# Patient Record
Sex: Female | Born: 1955 | Race: White | Hispanic: No | Smoking: Never smoker
Health system: Southern US, Community
[De-identification: ages and names within clinical notes are randomized; demographics above are authoritative.]

## PROBLEM LIST (undated history)

## (undated) DIAGNOSIS — F419 Anxiety disorder, unspecified: Secondary | ICD-10-CM

## (undated) DIAGNOSIS — E669 Obesity, unspecified: Secondary | ICD-10-CM

## (undated) DIAGNOSIS — A419 Sepsis, unspecified organism: Secondary | ICD-10-CM

## (undated) DIAGNOSIS — E1140 Type 2 diabetes mellitus with diabetic neuropathy, unspecified: Secondary | ICD-10-CM

## (undated) DIAGNOSIS — N189 Chronic kidney disease, unspecified: Secondary | ICD-10-CM

## (undated) DIAGNOSIS — J189 Pneumonia, unspecified organism: Secondary | ICD-10-CM

## (undated) DIAGNOSIS — J40 Bronchitis, not specified as acute or chronic: Secondary | ICD-10-CM

## (undated) DIAGNOSIS — I639 Cerebral infarction, unspecified: Secondary | ICD-10-CM

## (undated) DIAGNOSIS — G43909 Migraine, unspecified, not intractable, without status migrainosus: Secondary | ICD-10-CM

## (undated) DIAGNOSIS — I10 Essential (primary) hypertension: Secondary | ICD-10-CM

## (undated) DIAGNOSIS — M1990 Unspecified osteoarthritis, unspecified site: Secondary | ICD-10-CM

## (undated) DIAGNOSIS — F32A Depression, unspecified: Secondary | ICD-10-CM

## (undated) DIAGNOSIS — D649 Anemia, unspecified: Secondary | ICD-10-CM

## (undated) DIAGNOSIS — M549 Dorsalgia, unspecified: Secondary | ICD-10-CM

## (undated) DIAGNOSIS — M719 Bursopathy, unspecified: Secondary | ICD-10-CM

## (undated) DIAGNOSIS — E785 Hyperlipidemia, unspecified: Secondary | ICD-10-CM

## (undated) DIAGNOSIS — J45909 Unspecified asthma, uncomplicated: Secondary | ICD-10-CM

## (undated) DIAGNOSIS — N390 Urinary tract infection, site not specified: Secondary | ICD-10-CM

## (undated) DIAGNOSIS — J449 Chronic obstructive pulmonary disease, unspecified: Secondary | ICD-10-CM

## (undated) DIAGNOSIS — K219 Gastro-esophageal reflux disease without esophagitis: Secondary | ICD-10-CM

## (undated) HISTORY — PX: CHOLECYSTECTOMY: SHX55

## (undated) HISTORY — PX: EYE SURGERY: SHX253

---

## 1997-07-21 ENCOUNTER — Other Ambulatory Visit: Admission: RE | Admit: 1997-07-21 | Discharge: 1997-07-21 | Payer: Self-pay

## 1997-10-06 ENCOUNTER — Other Ambulatory Visit: Admission: RE | Admit: 1997-10-06 | Discharge: 1997-10-06 | Payer: Self-pay

## 2003-10-02 ENCOUNTER — Emergency Department (HOSPITAL_COMMUNITY): Admission: EM | Admit: 2003-10-02 | Discharge: 2003-10-02 | Payer: Self-pay | Admitting: Emergency Medicine

## 2005-05-28 ENCOUNTER — Emergency Department (HOSPITAL_COMMUNITY): Admission: EM | Admit: 2005-05-28 | Discharge: 2005-05-28 | Payer: Self-pay | Admitting: Emergency Medicine

## 2010-09-03 ENCOUNTER — Emergency Department (INDEPENDENT_AMBULATORY_CARE_PROVIDER_SITE_OTHER): Payer: Medicaid Other

## 2010-09-03 ENCOUNTER — Emergency Department (HOSPITAL_BASED_OUTPATIENT_CLINIC_OR_DEPARTMENT_OTHER)
Admission: EM | Admit: 2010-09-03 | Discharge: 2010-09-03 | Disposition: A | Discharge status: Home / Self Care | Payer: Medicaid Other | Attending: Emergency Medicine | Admitting: Emergency Medicine

## 2010-09-03 DIAGNOSIS — R109 Unspecified abdominal pain: Secondary | ICD-10-CM

## 2010-09-03 DIAGNOSIS — Z794 Long term (current) use of insulin: Secondary | ICD-10-CM | POA: Insufficient documentation

## 2010-09-03 DIAGNOSIS — E1149 Type 2 diabetes mellitus with other diabetic neurological complication: Secondary | ICD-10-CM | POA: Insufficient documentation

## 2010-09-03 DIAGNOSIS — Z79899 Other long term (current) drug therapy: Secondary | ICD-10-CM | POA: Insufficient documentation

## 2010-09-03 DIAGNOSIS — E1142 Type 2 diabetes mellitus with diabetic polyneuropathy: Secondary | ICD-10-CM | POA: Insufficient documentation

## 2010-09-03 DIAGNOSIS — G43909 Migraine, unspecified, not intractable, without status migrainosus: Secondary | ICD-10-CM | POA: Insufficient documentation

## 2010-09-03 DIAGNOSIS — R634 Abnormal weight loss: Secondary | ICD-10-CM

## 2010-09-03 LAB — DIFFERENTIAL
Basophils Absolute: 0 10*3/uL (ref 0.0–0.1)
Basophils Relative: 0 % (ref 0–1)
Eosinophils Absolute: 0.1 10*3/uL (ref 0.0–0.7)
Neutro Abs: 4.2 10*3/uL (ref 1.7–7.7)
Neutrophils Relative %: 54 % (ref 43–77)

## 2010-09-03 LAB — COMPREHENSIVE METABOLIC PANEL
ALT: 11 U/L (ref 0–35)
Albumin: 4.1 g/dL (ref 3.5–5.2)
Calcium: 9.7 mg/dL (ref 8.4–10.5)
GFR calc Af Amer: >60 mL/min (ref >60)
Glucose, Bld: 203 mg/dL — ABNORMAL HIGH (ref 70–99)
Sodium: 139 mEq/L (ref 135–145)
Total Protein: 7.5 g/dL (ref 6.0–8.3)

## 2010-09-03 LAB — CBC
Hemoglobin: 13.4 g/dL (ref 12.0–15.0)
Platelets: 280 10*3/uL (ref 150–400)
RBC: 4.83 MIL/uL (ref 3.87–5.11)
WBC: 7.7 10*3/uL (ref 4.0–10.5)

## 2010-09-03 LAB — URINALYSIS, ROUTINE W REFLEX MICROSCOPIC
Bilirubin Urine: NEGATIVE
Glucose, UA: >1000 mg/dL — AB
Hgb urine dipstick: NEGATIVE
Specific Gravity, Urine: 1.034 — ABNORMAL HIGH (ref 1.005–1.030)

## 2010-09-03 LAB — GLUCOSE, CAPILLARY: Glucose-Capillary: 180 mg/dL — ABNORMAL HIGH (ref 70–99)

## 2010-09-03 LAB — URINE MICROSCOPIC-ADD ON

## 2010-09-03 IMAGING — CT CT ABD-PELV W/ CM
2 of 5 series · 17 of 46 positions shown, 19 images · IV contrast (APPLIED)
Comparison: None.

CLINICAL DATA: Abdominal pain and weight loss.

CT ABDOMEN AND PELVIS WITH CONTRAST
TECHNIQUE: Multidetector CT imaging of the abdomen and pelvis was
performed following the standard protocol during bolus
administration of intravenous contrast.
Contrast: 100 ml [XT].

[Series 2: abd/pelvis 5.0 b31f · axial · 0.79mm/px · z∈[-492,-87]mm · 14 of 91 slices shown, 16 images]
[im 5/91  soft-tissue]
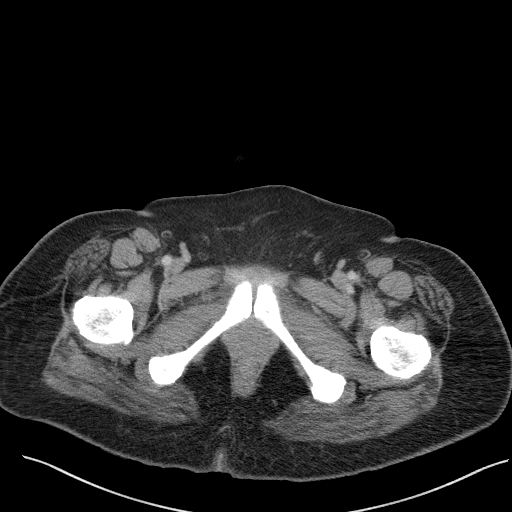
[im 5/91  bone]
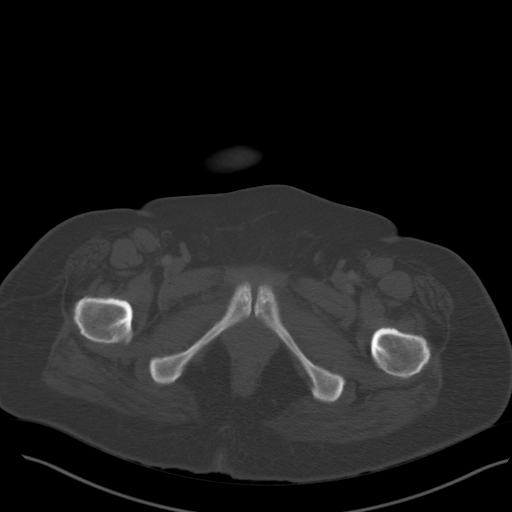
[im 10/91  soft-tissue]
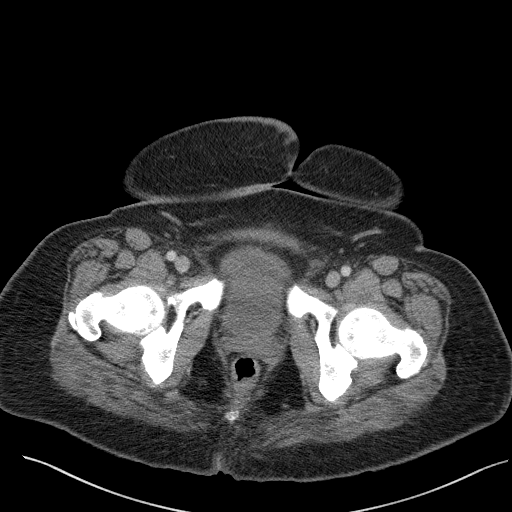
[im 19/91  soft-tissue]
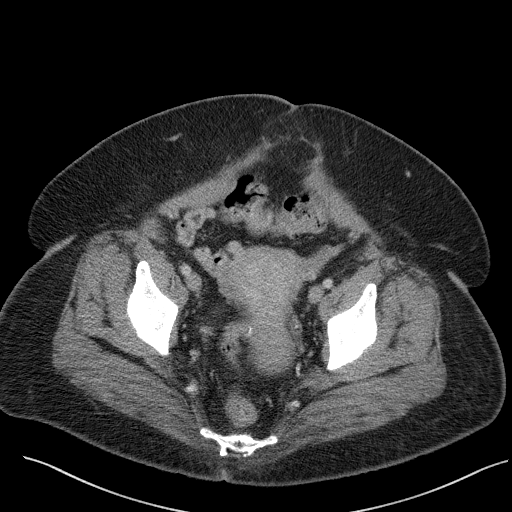
[im 24/91  soft-tissue]
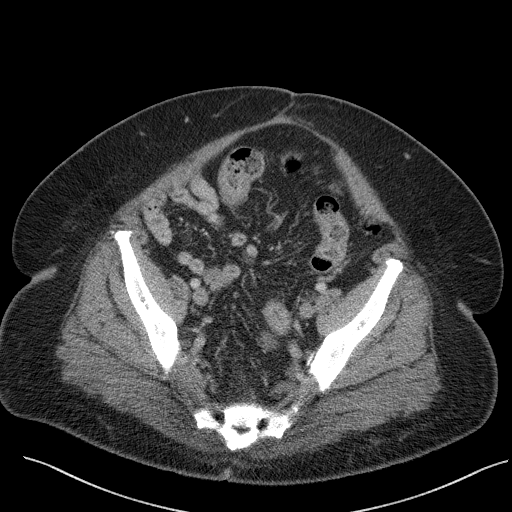
[im 29/91  soft-tissue]
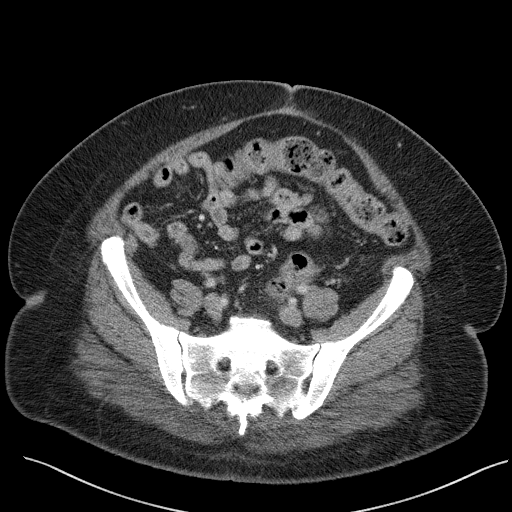
[im 38/91  soft-tissue]
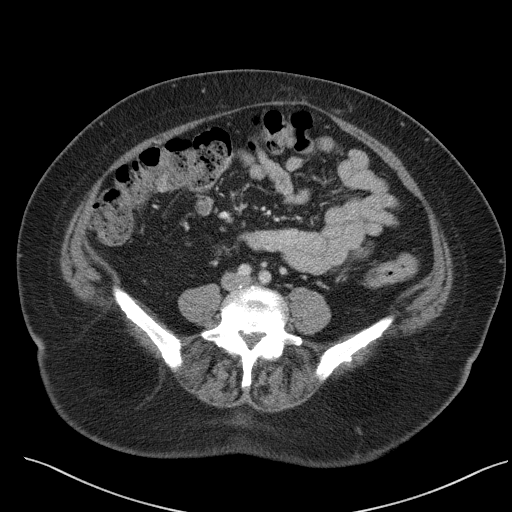
[im 43/91  soft-tissue]
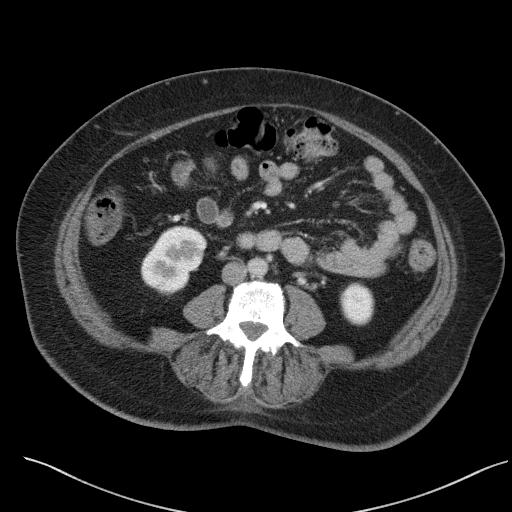
[im 48/91  soft-tissue]
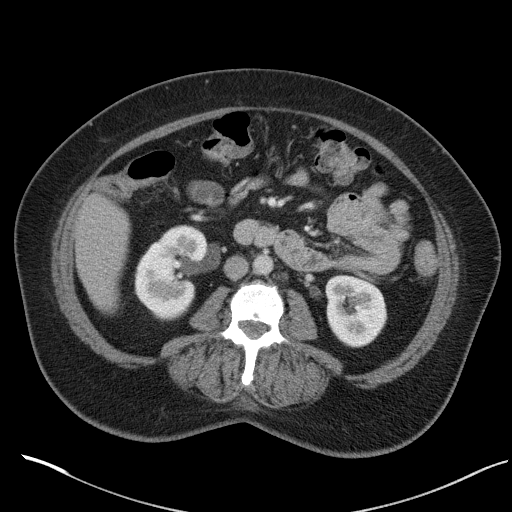
[im 53/91  soft-tissue]
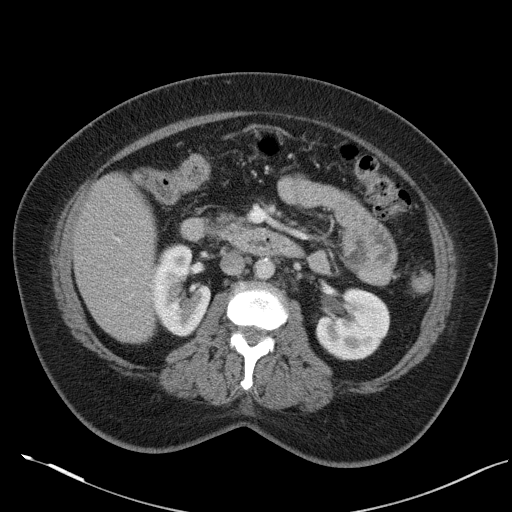
[im 53/91  bone]
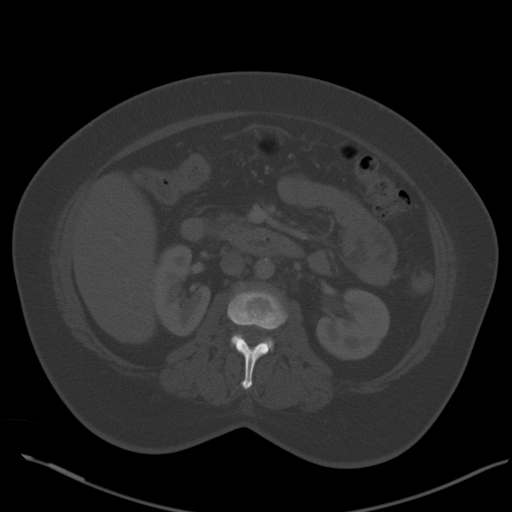
[im 62/91  soft-tissue]
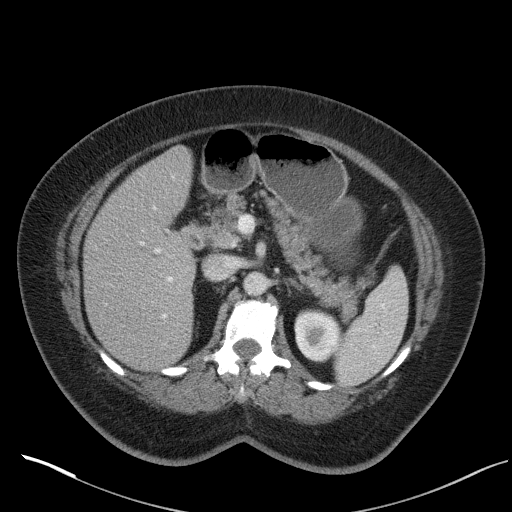
[im 67/91  soft-tissue]
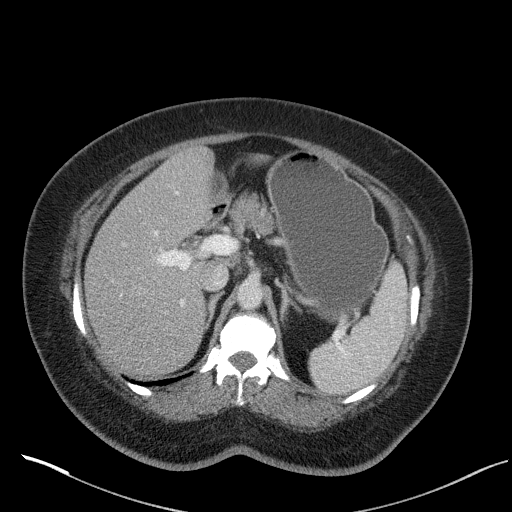
[im 72/91  soft-tissue]
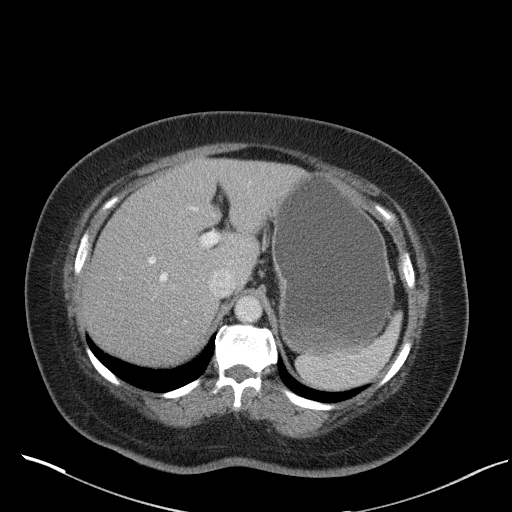
[im 81/91  soft-tissue]
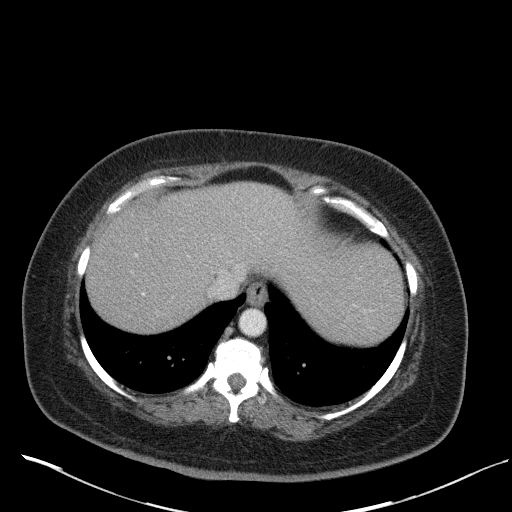
[im 86/91  soft-tissue]
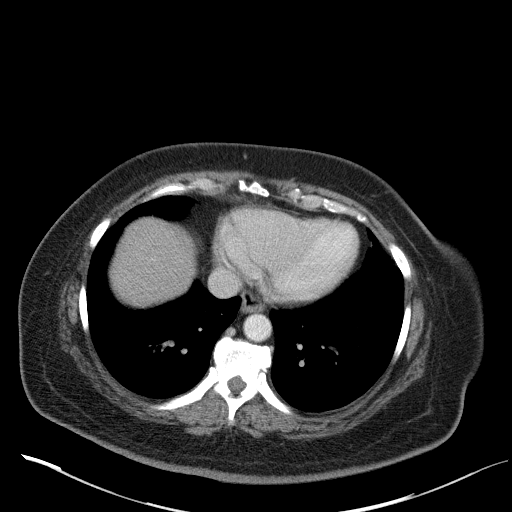

[Series 5: abd/pelvis 3.0 coronal · coronal · 0.91mm/px · 3 of 95 slices shown]
[im 32/95  soft-tissue]
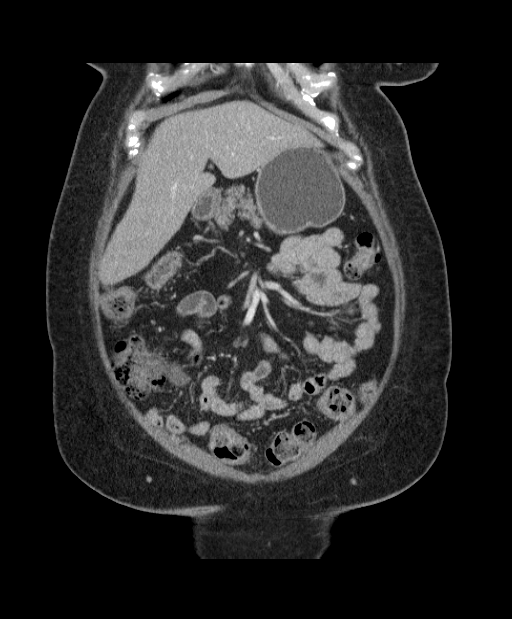
[im 42/95  soft-tissue]
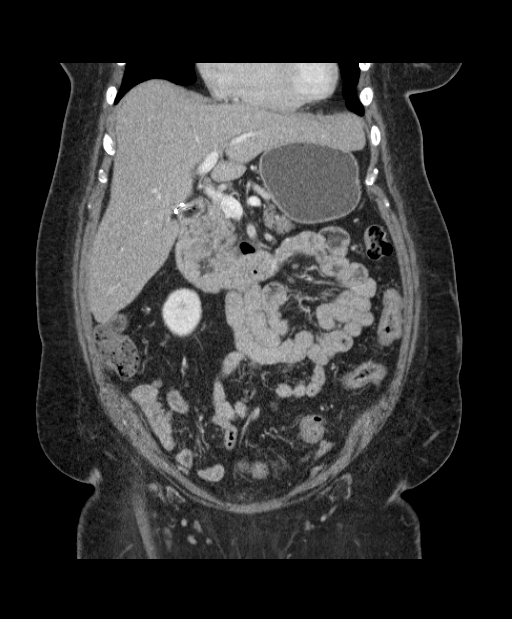
[im 53/95  soft-tissue]
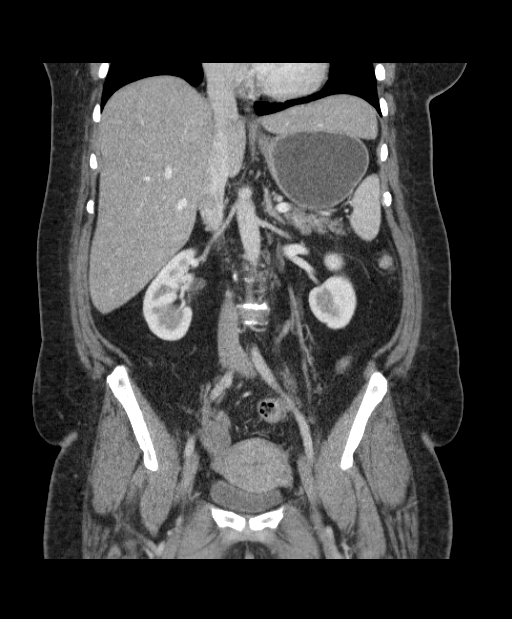

[17 of 46 positions shown; findings below may reference images not displayed]

FINDINGS: Lung bases are clear.  No pleural or pericardial
effusion.

The patient is status post cholecystectomy.  The liver, spleen,
adrenal glands, pancreas and kidneys all appear normal.  The
patient has a small fat containing midline hernia in the low
pelvis. A second very small midline hernia containing fat is
identified at the level of the upper sacrum.  Uterus, adnexa and
urinary bladder are unremarkable. No lymphadenopathy or fluid is
seen.  Stomach, small and large bowel and appendix are
unremarkable.  No focal bony abnormality.  Injection granuloma in
the left buttock is seen.
IMPRESSION: 1.  No acute finding.
2.  Two small fat containing midline ventral hernias are noted.

## 2010-09-03 MED ORDER — IOHEXOL 300 MG/ML  SOLN
100.0000 mL | Freq: Once | INTRAMUSCULAR | Status: AC | PRN
  Administered 2010-09-03: 100 mL via INTRAVENOUS

## 2011-05-06 ENCOUNTER — Encounter (HOSPITAL_BASED_OUTPATIENT_CLINIC_OR_DEPARTMENT_OTHER): Payer: Self-pay

## 2011-05-06 ENCOUNTER — Emergency Department (HOSPITAL_BASED_OUTPATIENT_CLINIC_OR_DEPARTMENT_OTHER)
Admission: EM | Admit: 2011-05-06 | Discharge: 2011-05-06 | Discharge status: Against Medical Advice | Payer: Medicaid Other | Attending: Emergency Medicine | Admitting: Emergency Medicine

## 2011-05-06 DIAGNOSIS — J069 Acute upper respiratory infection, unspecified: Secondary | ICD-10-CM | POA: Insufficient documentation

## 2011-05-06 HISTORY — DX: Bronchitis, not specified as acute or chronic: J40

## 2011-05-06 HISTORY — DX: Type 2 diabetes mellitus with diabetic neuropathy, unspecified: E11.40

## 2011-05-06 HISTORY — DX: Hyperlipidemia, unspecified: E78.5

## 2011-05-06 HISTORY — DX: Unspecified osteoarthritis, unspecified site: M19.90

## 2011-05-06 HISTORY — DX: Essential (primary) hypertension: I10

## 2011-05-06 HISTORY — DX: Obesity, unspecified: E66.9

## 2011-05-06 LAB — GLUCOSE, CAPILLARY: Glucose-Capillary: 319 mg/dL — ABNORMAL HIGH (ref 70–99)

## 2011-05-06 NOTE — ED Notes (Signed)
The patients CBG was 319.

## 2011-05-06 NOTE — ED Notes (Signed)
Pt states that she has cold uri symptoms.  Has been going on since Friday, no fever.  Pt states that she usually get bronchitis when she has these symptoms.

## 2011-05-06 NOTE — ED Notes (Signed)
Called in lobby. No answer. 

## 2011-05-18 ENCOUNTER — Encounter (HOSPITAL_BASED_OUTPATIENT_CLINIC_OR_DEPARTMENT_OTHER): Payer: Self-pay | Admitting: *Deleted

## 2011-05-18 ENCOUNTER — Emergency Department (HOSPITAL_BASED_OUTPATIENT_CLINIC_OR_DEPARTMENT_OTHER)
Admission: EM | Admit: 2011-05-18 | Discharge: 2011-05-18 | Disposition: A | Discharge status: Home / Self Care | Payer: Medicaid Other | Attending: Emergency Medicine | Admitting: Emergency Medicine

## 2011-05-18 DIAGNOSIS — R300 Dysuria: Secondary | ICD-10-CM | POA: Insufficient documentation

## 2011-05-18 DIAGNOSIS — Z794 Long term (current) use of insulin: Secondary | ICD-10-CM | POA: Insufficient documentation

## 2011-05-18 DIAGNOSIS — Z79899 Other long term (current) drug therapy: Secondary | ICD-10-CM | POA: Insufficient documentation

## 2011-05-18 DIAGNOSIS — R109 Unspecified abdominal pain: Secondary | ICD-10-CM | POA: Insufficient documentation

## 2011-05-18 DIAGNOSIS — M79609 Pain in unspecified limb: Secondary | ICD-10-CM | POA: Insufficient documentation

## 2011-05-18 DIAGNOSIS — E785 Hyperlipidemia, unspecified: Secondary | ICD-10-CM | POA: Insufficient documentation

## 2011-05-18 DIAGNOSIS — B3749 Other urogenital candidiasis: Secondary | ICD-10-CM | POA: Insufficient documentation

## 2011-05-18 DIAGNOSIS — M545 Low back pain, unspecified: Secondary | ICD-10-CM | POA: Insufficient documentation

## 2011-05-18 DIAGNOSIS — I10 Essential (primary) hypertension: Secondary | ICD-10-CM | POA: Insufficient documentation

## 2011-05-18 DIAGNOSIS — N390 Urinary tract infection, site not specified: Secondary | ICD-10-CM

## 2011-05-18 DIAGNOSIS — B3741 Candidal cystitis and urethritis: Secondary | ICD-10-CM

## 2011-05-18 DIAGNOSIS — E119 Type 2 diabetes mellitus without complications: Secondary | ICD-10-CM | POA: Insufficient documentation

## 2011-05-18 DIAGNOSIS — M129 Arthropathy, unspecified: Secondary | ICD-10-CM | POA: Insufficient documentation

## 2011-05-18 LAB — BASIC METABOLIC PANEL
CO2: 27 mEq/L (ref 19–32)
Chloride: 101 mEq/L (ref 96–112)
Creatinine, Ser: 0.5 mg/dL (ref 0.50–1.10)
GFR calc Af Amer: >90 mL/min (ref >90)
Potassium: 3.8 mEq/L (ref 3.5–5.1)
Sodium: 139 mEq/L (ref 135–145)

## 2011-05-18 LAB — CBC
Hemoglobin: 13.3 g/dL (ref 12.0–15.0)
MCHC: 34 g/dL (ref 30.0–36.0)
RDW: 13.1 % (ref 11.5–15.5)
WBC: 8.6 10*3/uL (ref 4.0–10.5)

## 2011-05-18 LAB — DIFFERENTIAL
Eosinophils Absolute: 0.1 10*3/uL (ref 0.0–0.7)
Eosinophils Relative: 1 % (ref 0–5)
Monocytes Relative: 7 % (ref 3–12)
Neutro Abs: 4.7 10*3/uL (ref 1.7–7.7)
Neutrophils Relative %: 55 % (ref 43–77)

## 2011-05-18 LAB — URINALYSIS, ROUTINE W REFLEX MICROSCOPIC
Bilirubin Urine: NEGATIVE
Glucose, UA: >1000 mg/dL — AB
Hgb urine dipstick: NEGATIVE
Ketones, ur: NEGATIVE mg/dL
Leukocytes, UA: NEGATIVE
Protein, ur: NEGATIVE mg/dL
pH: 6 (ref 5.0–8.0)

## 2011-05-18 LAB — URINE MICROSCOPIC-ADD ON

## 2011-05-18 MED ORDER — CIPROFLOXACIN HCL 500 MG PO TABS: 500.0000 mg | ORAL_TABLET | Freq: Two times a day (BID) | ORAL | Status: AC

## 2011-05-18 MED ORDER — FLUCONAZOLE 100 MG PO TABS
200.0000 mg | ORAL_TABLET | Freq: Once | ORAL | Status: AC
  Administered 2011-05-18: 200 mg via ORAL
  Filled 2011-05-18: qty 2

## 2011-05-18 MED ORDER — HYDROCODONE-ACETAMINOPHEN 5-500 MG PO TABS: 1.0000 | ORAL_TABLET | Freq: Four times a day (QID) | ORAL | Status: AC | PRN

## 2011-05-18 NOTE — ED Provider Notes (Signed)
History     CSN: [id number]  Arrival date & time 05/18/11  1050   First MD Initiated Contact with Patient 05/18/11 1125      Chief Complaint  Patient presents with  . Urinary Tract Infection  . Back Pain  . Leg Pain    (Consider location/radiation/quality/duration/timing/severity/associated sxs/prior treatment) Patient is a 57 y.o. female presenting with urinary tract infection, back pain, and leg pain. The history is provided by the patient.  Urinary Tract Infection This is a new problem. The current episode started 2 days ago. The problem occurs constantly. The problem has been gradually worsening. Associated symptoms include abdominal pain. Pertinent negatives include no chest pain and no shortness of breath. Associated symptoms comments: Pain in the lower back that goes into both legs. Exacerbated by: Worse with urination. Unchanged by walking, standing or bending. The symptoms are relieved by nothing. She has tried acetaminophen for the symptoms. The treatment provided no relief.  Back Pain  This is a new problem. The current episode started 2 days ago. The problem occurs constantly. The problem has not changed since onset.The pain is associated with no known injury. The pain is present in the lumbar spine. The quality of the pain is described as shooting and aching. Radiates to: Radiates down both legs. The pain is at a severity of 9/10. The pain is severe. Exacerbated by: Feels better with walking around and moving the legs. Worse with urination. Associated symptoms include abdominal pain, dysuria and leg pain. Pertinent negatives include no chest pain, no bowel incontinence, no perianal numbness, no bladder incontinence, no paresthesias, no paresis, no tingling and no weakness. She has tried NSAIDs and heat for the symptoms. The treatment provided no relief.  Leg Pain  Pertinent negatives include no tingling.    Past Medical History  Diagnosis Date  . Bronchitis   . Diabetic  neuropathy   . Diabetic retinopathy   . Arthritis   . Hypertension   . Hyperlipemia   . Obesity   . Diabetes mellitus     Past Surgical History  Procedure Date  . Cesarean section     x3  . Cholecystectomy     History reviewed. No pertinent family history.  History  Substance Use Topics  . Smoking status: Never Smoker   . Smokeless tobacco: Never Used  . Alcohol Use: No    OB History    Grav Para Term Preterm Abortions TAB SAB Ect Mult Living                  Review of Systems  Respiratory: Negative for shortness of breath.   Cardiovascular: Negative for chest pain.  Gastrointestinal: Positive for abdominal pain. Negative for bowel incontinence.  Genitourinary: Positive for dysuria. Negative for bladder incontinence.  Musculoskeletal: Positive for back pain.  Neurological: Negative for tingling, weakness and paresthesias.  All other systems reviewed and are negative.    Allergies  Morphine and related  Home Medications   Current Outpatient Rx  Name Route Sig Dispense Refill  . ATENOLOL 25 MG PO TABS Oral Take 25 mg by mouth daily.    Marland Kitchen FLUOXETINE HCL 20 MG PO CAPS Oral Take 20 mg by mouth daily.    Marland Kitchen GABAPENTIN 300 MG PO CAPS Oral Take 300 mg by mouth 3 (three) times daily.    . GLYBURIDE 1.25 MG PO TABS Oral Take 1.25 mg by mouth daily with breakfast.    . INSULIN GLARGINE 100 UNIT/ML [place] SOLN Subcutaneous Inject 20  Units into the skin at bedtime.    Marland Kitchen METFORMIN HCL ER (MOD) 1000 MG PO TB24 Oral Take 1,000 mg by mouth 3 (three) times daily.    Marland Kitchen METHOCARBAMOL 750 MG PO TABS Oral Take 375 mg by mouth as needed.      BP 145/67  Pulse 83  Temp(Src) 98.3 F (36.8 C) (Oral)  Resp 18  SpO2 99%  Physical Exam  Nursing note and vitals reviewed. Constitutional: She is oriented to person, place, and time. She appears well-developed and well-nourished. No distress.  HENT:  Head: Normocephalic and atraumatic.  Eyes: EOM are normal. Pupils are equal, round,  and reactive to light.  Cardiovascular: Normal rate, regular rhythm, normal heart sounds and intact distal pulses.  Exam reveals no friction rub.   No murmur heard. Pulmonary/Chest: Effort normal and breath sounds normal. She has no wheezes. She has no rales.  Abdominal: Soft. Bowel sounds are normal. She exhibits no distension. There is no tenderness. There is CVA tenderness. There is no rebound and no guarding.       Right CVA tenderness  Musculoskeletal: Normal range of motion. She exhibits no edema and no tenderness.       No edema  Neurological: She is alert and oriented to person, place, and time. No cranial nerve deficit.  Skin: Skin is warm and dry. No rash noted.  Psychiatric: She has a normal mood and affect. Her behavior is normal.    ED Course  Procedures (including critical care time)  Labs Reviewed  URINALYSIS, ROUTINE W REFLEX MICROSCOPIC - Abnormal; Notable for the following:    Glucose, UA >1000 (*)    All other components within normal limits  URINE MICROSCOPIC-ADD ON - Abnormal; Notable for the following:    Bacteria, UA MANY (*)    All other components within normal limits  BASIC METABOLIC PANEL - Abnormal; Notable for the following:    Glucose, Bld 271 (*)    All other components within normal limits  CBC  DIFFERENTIAL  BASIC METABOLIC PANEL   No results found.   No diagnosis found.    MDM   Patient with urinary urgency and right-sided CVA tenderness. Started 2 days ago. Patient denies any fever or vomiting but states her sugars have been elevated. She is well appearing and gait is normal. No right lower quadrant tenderness concerning for appendicitis. Status post cholecystectomy. Low suspicion for diverticulitis or pancreatitis. Symptoms are not suggestive of sciatica or lumbar pathology. UA, CBC, BMP pending.  1:28 PM UA consistent with many bacteria concern for UTI. Also yeast. Otherwise labs normal besides hyperglycemia. Patient given Diflucan and  antibiotic. She will follow up with her doctor next week for recheck.     Gwyneth Sprout, MD 05/18/11 1328

## 2011-05-18 NOTE — Discharge Instructions (Signed)
Candida Infection, Adult A candida infection (also called yeast, fungus and Monilia infection) is an overgrowth of yeast that can occur anywhere on the body. A yeast infection commonly occurs in warm, moist body areas. Usually, the infection remains localized but can spread to become a systemic infection. A yeast infection may be a sign of a more severe disease such as diabetes, leukemia, or AIDS. A yeast infection can occur in both men and women. In women, Candida vaginitis is a vaginal infection. It is one of the most common causes of vaginitis. Men usually do not have symptoms or know they have an infection until other problems develop. Men may find out they have a yeast infection because their sex partner has a yeast infection. Uncircumcised men are more likely to get a yeast infection than circumcised men. This is because the uncircumcised glans is not exposed to air and does not remain as dry as that of a circumcised glans. Older adults may develop yeast infections around dentures. CAUSES  Women  Antibiotics.   Steroid medication taken for a long time.   Being overweight (obese).   Diabetes.   Poor immune condition.   Certain serious medical conditions.   Immune suppressive medications for organ transplant patients.   Chemotherapy.   Pregnancy.   Menstration.   Stress and fatigue.   Intravenous drug use.   Oral contraceptives.   Wearing tight-fitting clothes in the crotch area.   Catching it from a sex partner who has a yeast infection.   Spermicide.   Intravenous, urinary, or other catheters.  Men  Catching it from a sex partner who has a yeast infection.   Having oral or anal sex with a person who has the infection.   Spermicide.   Diabetes.   Antibiotics.   Poor immune system.   Medications that suppress the immune system.   Intravenous drug use.   Intravenous, urinary, or other catheters.  SYMPTOMS  Women  Thick, white vaginal discharge.    Vaginal itching.   Redness and swelling in and around the vagina.   Irritation of the lips of the vagina and perineum.   Blisters on the vaginal lips and perineum.   Painful sexual intercourse.   Low blood sugar (hypoglycemia).   Painful urination.   Bladder infections.   Intestinal problems such as constipation, indigestion, bad breath, bloating, increase in gas, diarrhea, or loose stools.  Men  Men may develop intestinal problems such as constipation, indigestion, bad breath, bloating, increase in gas, diarrhea, or loose stools.   Dry, cracked skin on the penis with itching or discomfort.   Jock itch.   Dry, flaky skin.   Athlete's foot.   Hypoglycemia.  DIAGNOSIS  Women  A history and an exam are performed.   The discharge may be examined under a microscope.   A culture may be taken of the discharge.  Men  A history and an exam are performed.   Any discharge from the penis or areas of cracked skin will be looked at under the microscope and cultured.   Stool samples may be cultured.  TREATMENT  Women  Vaginal antifungal suppositories and creams.   Medicated creams to decrease irritation and itching on the outside of the vagina.   Warm compresses to the perineal area to decrease swelling and discomfort.   Oral antifungal medications.   Medicated vaginal suppositories or cream for repeated or recurrent infections.   Wash and dry the irritation areas before applying the cream.     Eating yogurt with lactobacillus may help with prevention and treatment.   Sometimes painting the vagina with gentian violet solution may help if creams and suppositories do not work.  Men  Antifungal creams and oral antifungal medications.   Sometimes treatment must continue for 30 days after the symptoms go away to prevent recurrence.  HOME CARE INSTRUCTIONS  Women  Use cotton underwear and avoid tight-fitting clothing.   Avoid colored, scented toilet paper and  deodorant tampons or pads.   Do not douche.   Keep your diabetes under control.   Finish all the prescribed medications.   Keep your skin clean and dry.   Consume milk or yogurt with lactobacillus active culture regularly. If you get frequent yeast infections and think that is what the infection is, there are over-the-counter medications that you can get. If the infection does not show healing in 3 days, talk to your caregiver.   Tell your sex partner you have a yeast infection. Your partner may need treatment also, especially if your infection does not clear up or recurs.  Men  Keep your skin clean and dry.   Keep your diabetes under control.   Finish all prescribed medications.   Tell your sex partner that you have a yeast infection so they can be treated if necessary.  SEEK MEDICAL CARE IF:   Your symptoms do not clear up or worsen in one week after treatment.   You have an oral temperature above 102 F (38.9 C).   You have trouble swallowing or eating for a prolonged time.   You develop blisters on and around your vagina.   You develop vaginal bleeding and it is not your menstrual period.   You develop abdominal pain.   You develop intestinal problems as mentioned above.   You get weak or lightheaded.   You have painful or increased urination.   You have pain during sexual intercourse.  MAKE SURE YOU:   Understand these instructions.   Will watch your condition.   Will get help right away if you are not doing well or get worse.  Document Released: 04/19/2004 Document Revised: 11/22/2010 Document Reviewed: 08/01/2009 [employer] Patient Information 2012 [employer]. 

## 2011-05-21 LAB — URINE CULTURE
Colony Count: >=100000
Culture  Setup Time: 201302230608

## 2011-05-22 NOTE — ED Notes (Signed)
Patient treated with  Cipro-sensitive to same-chart appended per protocol MD. 

## 2011-09-09 ENCOUNTER — Encounter (HOSPITAL_BASED_OUTPATIENT_CLINIC_OR_DEPARTMENT_OTHER): Payer: Self-pay | Admitting: *Deleted

## 2011-09-09 ENCOUNTER — Emergency Department (HOSPITAL_BASED_OUTPATIENT_CLINIC_OR_DEPARTMENT_OTHER)
Admission: EM | Admit: 2011-09-09 | Discharge: 2011-09-09 | Disposition: A | Discharge status: Home / Self Care | Payer: Medicaid Other | Attending: Emergency Medicine | Admitting: Emergency Medicine

## 2011-09-09 ENCOUNTER — Emergency Department (HOSPITAL_BASED_OUTPATIENT_CLINIC_OR_DEPARTMENT_OTHER): Payer: Medicaid Other

## 2011-09-09 DIAGNOSIS — R739 Hyperglycemia, unspecified: Secondary | ICD-10-CM

## 2011-09-09 DIAGNOSIS — M1990 Unspecified osteoarthritis, unspecified site: Secondary | ICD-10-CM

## 2011-09-09 DIAGNOSIS — M25569 Pain in unspecified knee: Secondary | ICD-10-CM

## 2011-09-09 DIAGNOSIS — Z794 Long term (current) use of insulin: Secondary | ICD-10-CM | POA: Insufficient documentation

## 2011-09-09 DIAGNOSIS — M1710 Unilateral primary osteoarthritis, unspecified knee: Secondary | ICD-10-CM | POA: Insufficient documentation

## 2011-09-09 DIAGNOSIS — M25559 Pain in unspecified hip: Secondary | ICD-10-CM | POA: Insufficient documentation

## 2011-09-09 DIAGNOSIS — E669 Obesity, unspecified: Secondary | ICD-10-CM | POA: Insufficient documentation

## 2011-09-09 DIAGNOSIS — E1149 Type 2 diabetes mellitus with other diabetic neurological complication: Secondary | ICD-10-CM | POA: Insufficient documentation

## 2011-09-09 DIAGNOSIS — IMO0002 Reserved for concepts with insufficient information to code with codable children: Secondary | ICD-10-CM | POA: Insufficient documentation

## 2011-09-09 DIAGNOSIS — I10 Essential (primary) hypertension: Secondary | ICD-10-CM | POA: Insufficient documentation

## 2011-09-09 DIAGNOSIS — Z6836 Body mass index (BMI) 36.0-36.9, adult: Secondary | ICD-10-CM | POA: Insufficient documentation

## 2011-09-09 DIAGNOSIS — E1142 Type 2 diabetes mellitus with diabetic polyneuropathy: Secondary | ICD-10-CM | POA: Insufficient documentation

## 2011-09-09 DIAGNOSIS — E11319 Type 2 diabetes mellitus with unspecified diabetic retinopathy without macular edema: Secondary | ICD-10-CM | POA: Insufficient documentation

## 2011-09-09 DIAGNOSIS — E1139 Type 2 diabetes mellitus with other diabetic ophthalmic complication: Secondary | ICD-10-CM | POA: Insufficient documentation

## 2011-09-09 DIAGNOSIS — Z79899 Other long term (current) drug therapy: Secondary | ICD-10-CM | POA: Insufficient documentation

## 2011-09-09 DIAGNOSIS — E785 Hyperlipidemia, unspecified: Secondary | ICD-10-CM | POA: Insufficient documentation

## 2011-09-09 LAB — URINALYSIS, ROUTINE W REFLEX MICROSCOPIC
Bilirubin Urine: NEGATIVE
Hgb urine dipstick: NEGATIVE
Specific Gravity, Urine: 1.014 (ref 1.005–1.030)
pH: 5.5 (ref 5.0–8.0)

## 2011-09-09 LAB — GLUCOSE, CAPILLARY: Glucose-Capillary: 329 mg/dL — ABNORMAL HIGH (ref 70–99)

## 2011-09-09 LAB — URINE MICROSCOPIC-ADD ON

## 2011-09-09 IMAGING — CR DG HIP (WITH OR WITHOUT PELVIS) 2-3V*L*
3 series · 3 of 3 positions shown · non-contrast
Comparison: None.

CLINICAL DATA: Left hip pain

LEFT HIP - COMPLETE 2+ VIEW

[t pelvis a.p.]
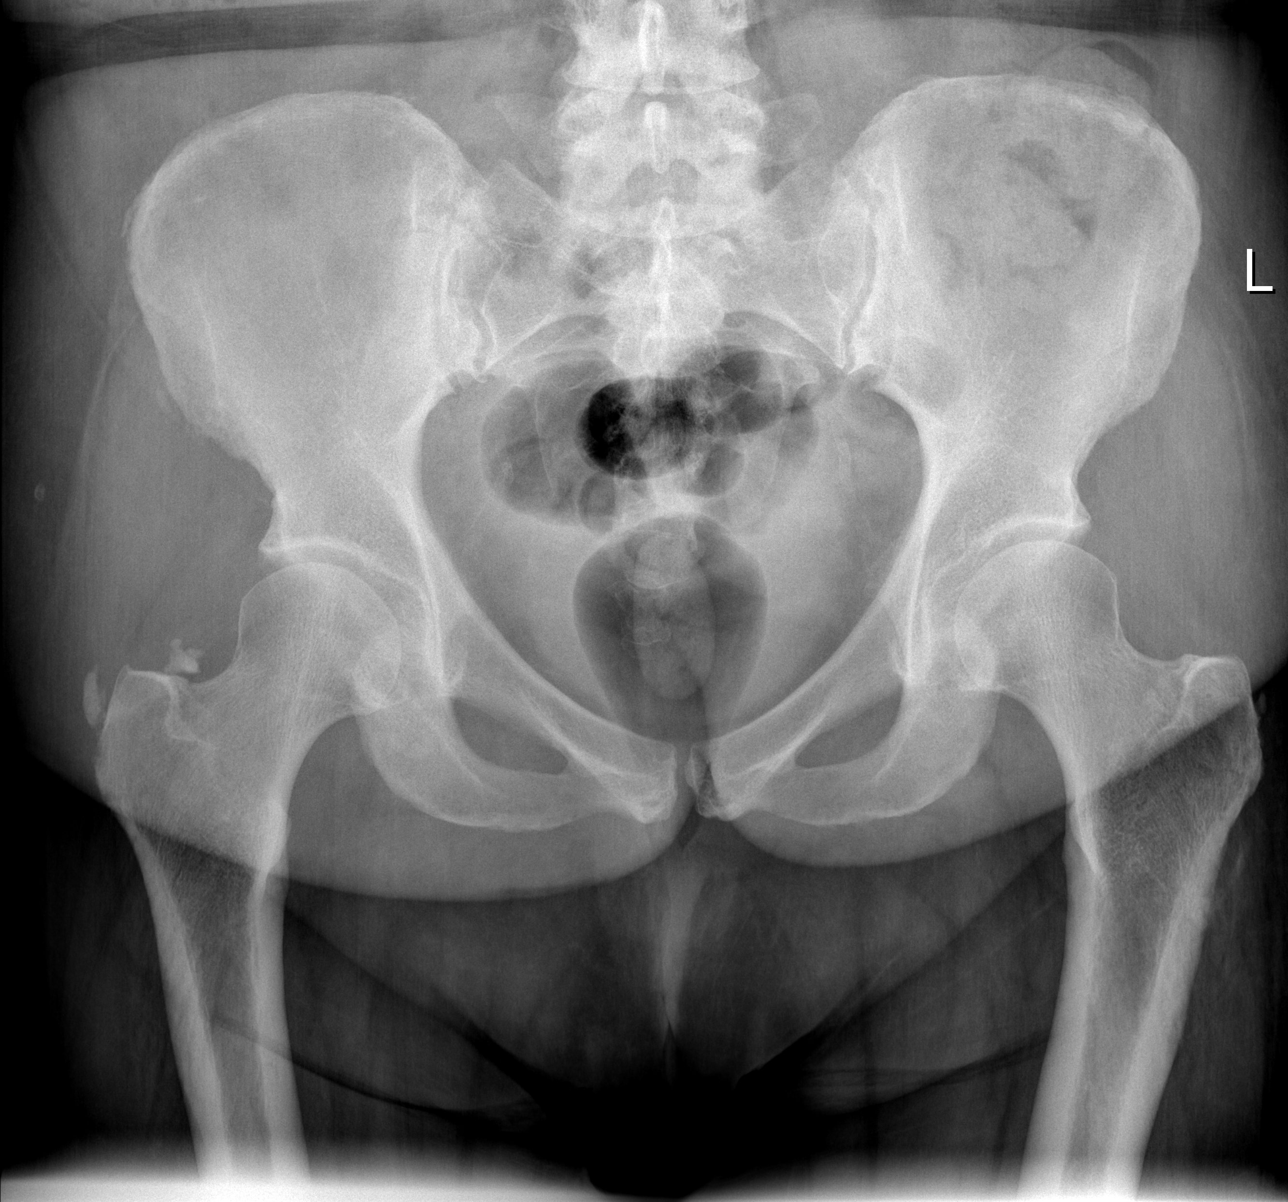

[t hip ap left]
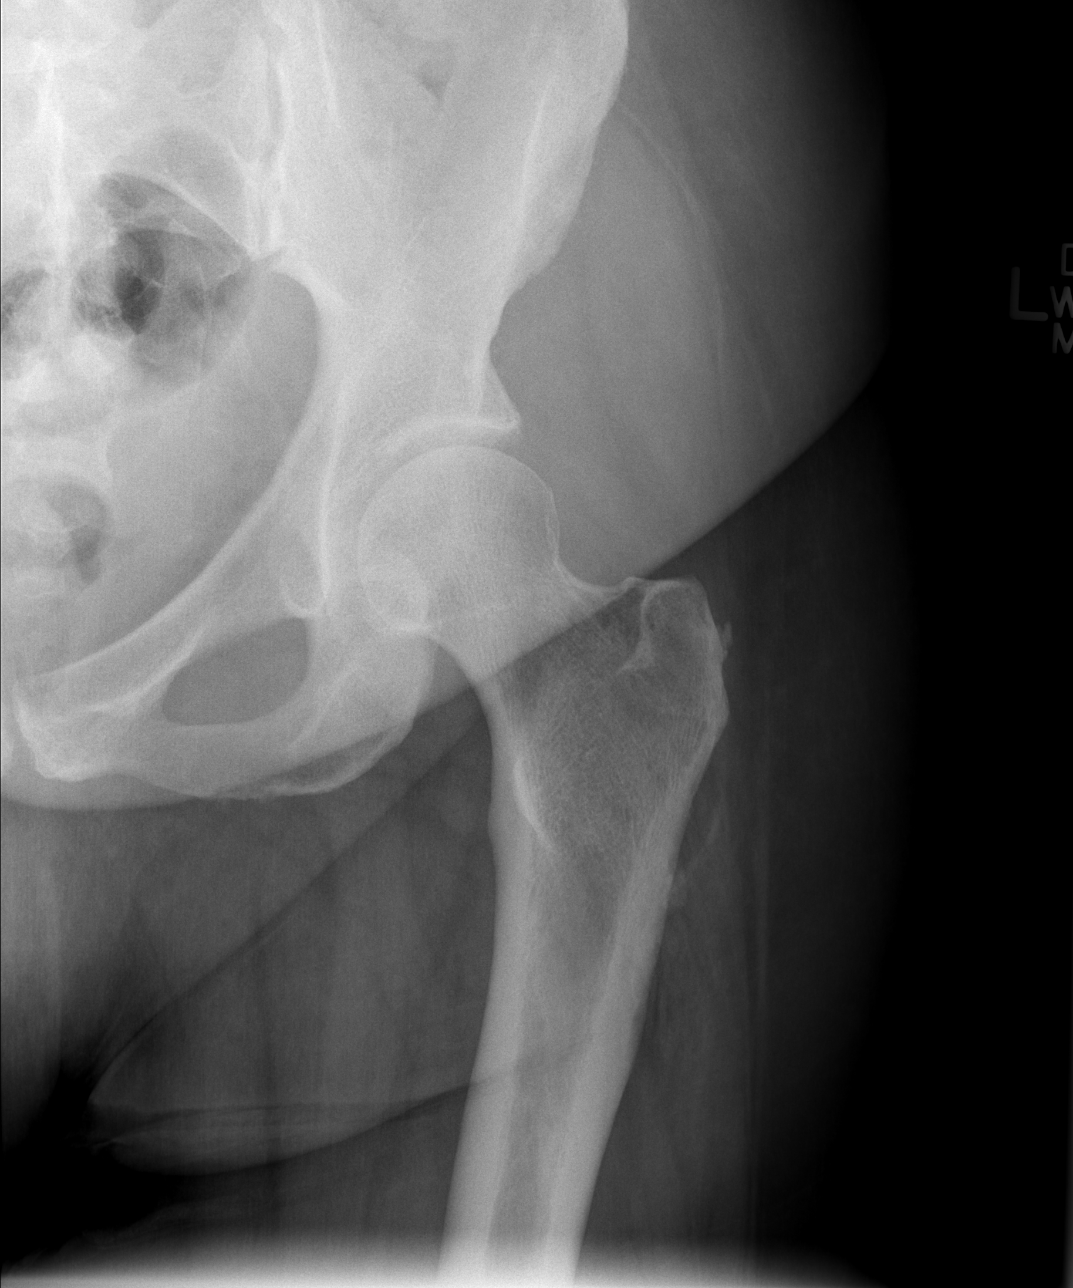

[t hip frog leg left]
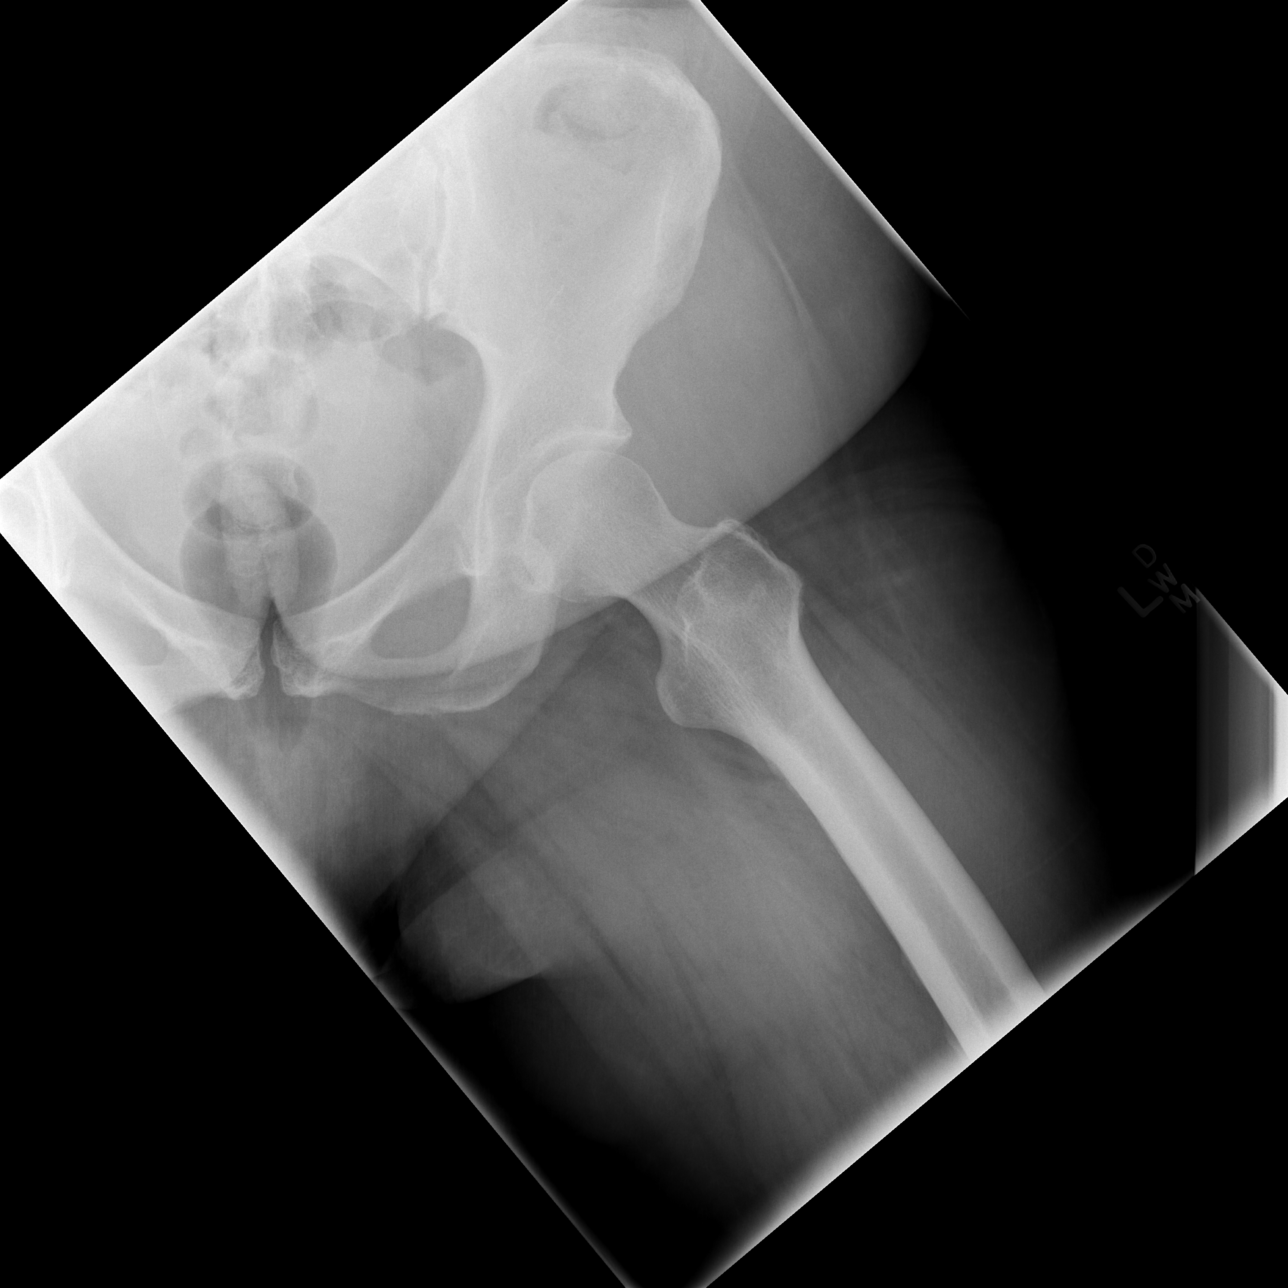

[3 of 3 positions shown; findings below may reference images not displayed]

FINDINGS: Dystrophic or soft tissue ossification/calcification
incidentally noted over the right hip.  No displaced pelvic
fracture.  SI joints are normal.  No left hip fracture or
dislocation.
IMPRESSION: No acute abnormality.

## 2011-09-09 IMAGING — CR DG KNEE COMPLETE 4+V*L*
4 series · 4 of 4 positions shown · non-contrast
Comparison: None.

CLINICAL DATA: Left knee pain

LEFT KNEE - COMPLETE 4+ VIEW

[t knee ap left]
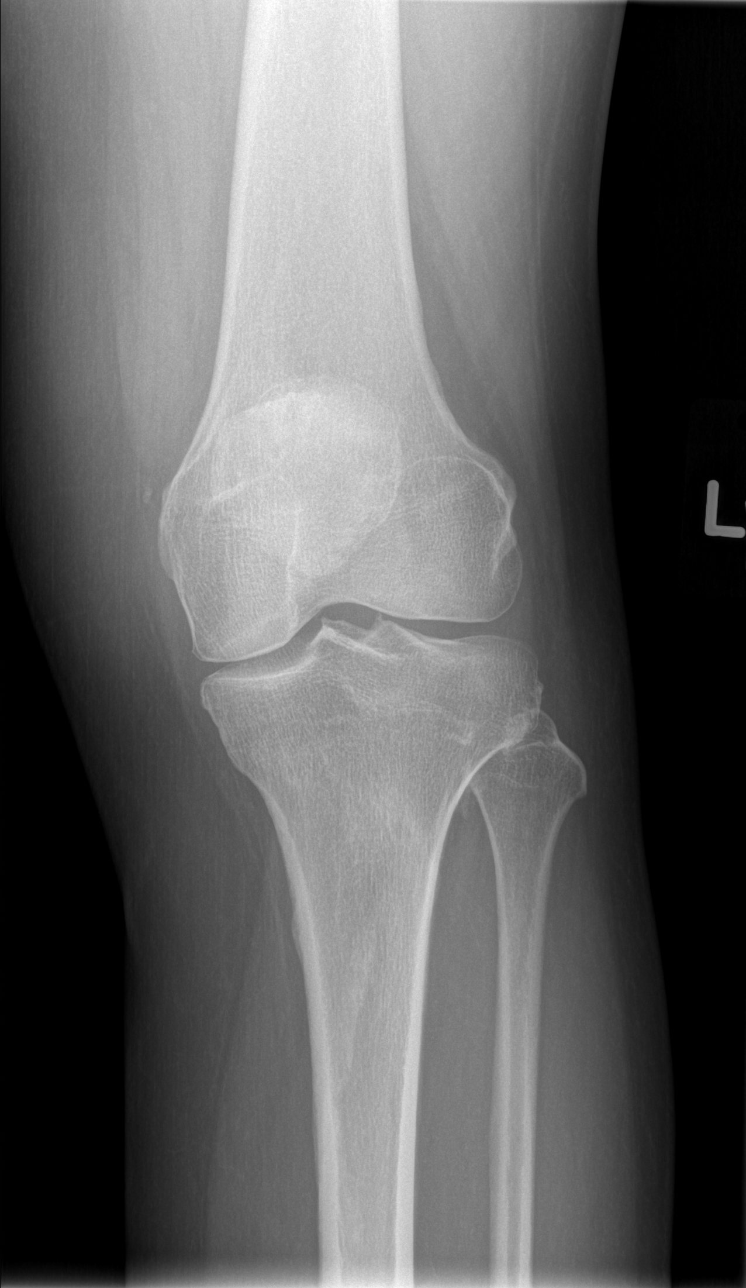

[t knee oblique left (1 of 2)]
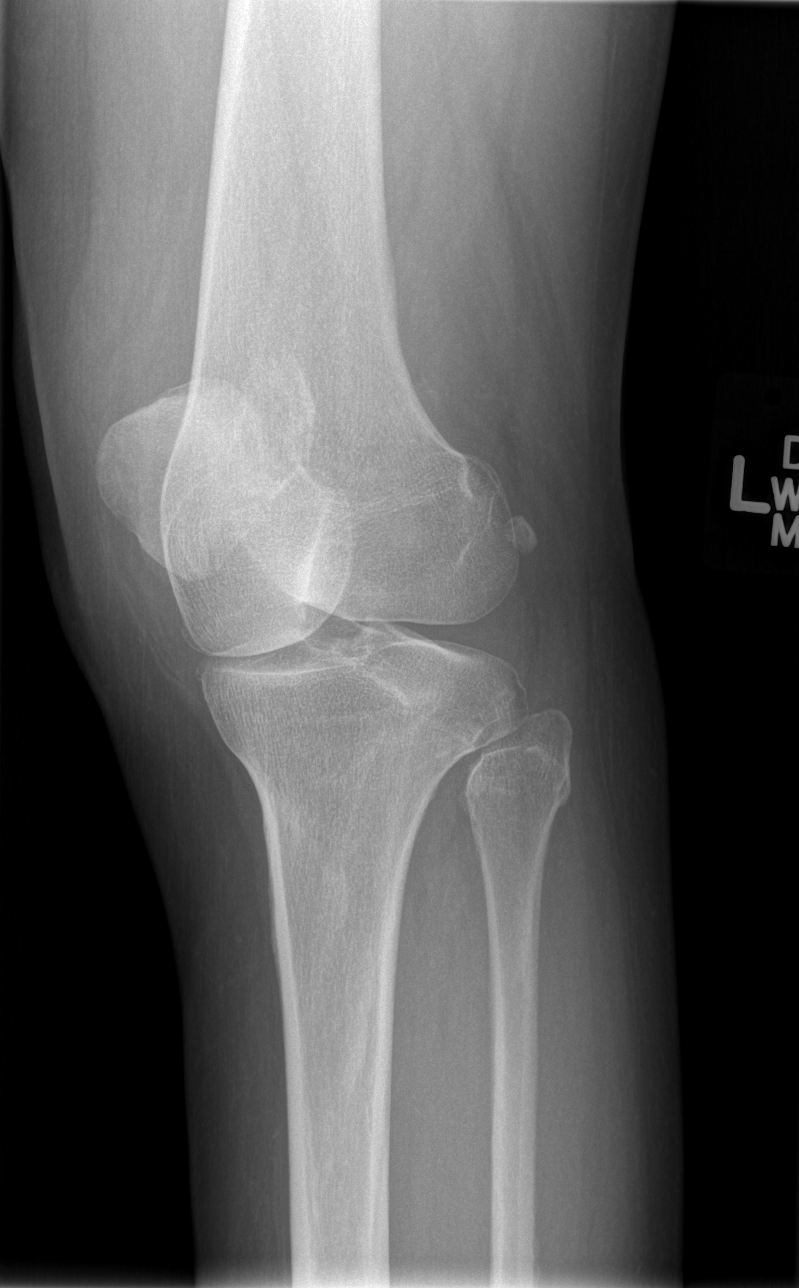

[t knee oblique left (2 of 2)]
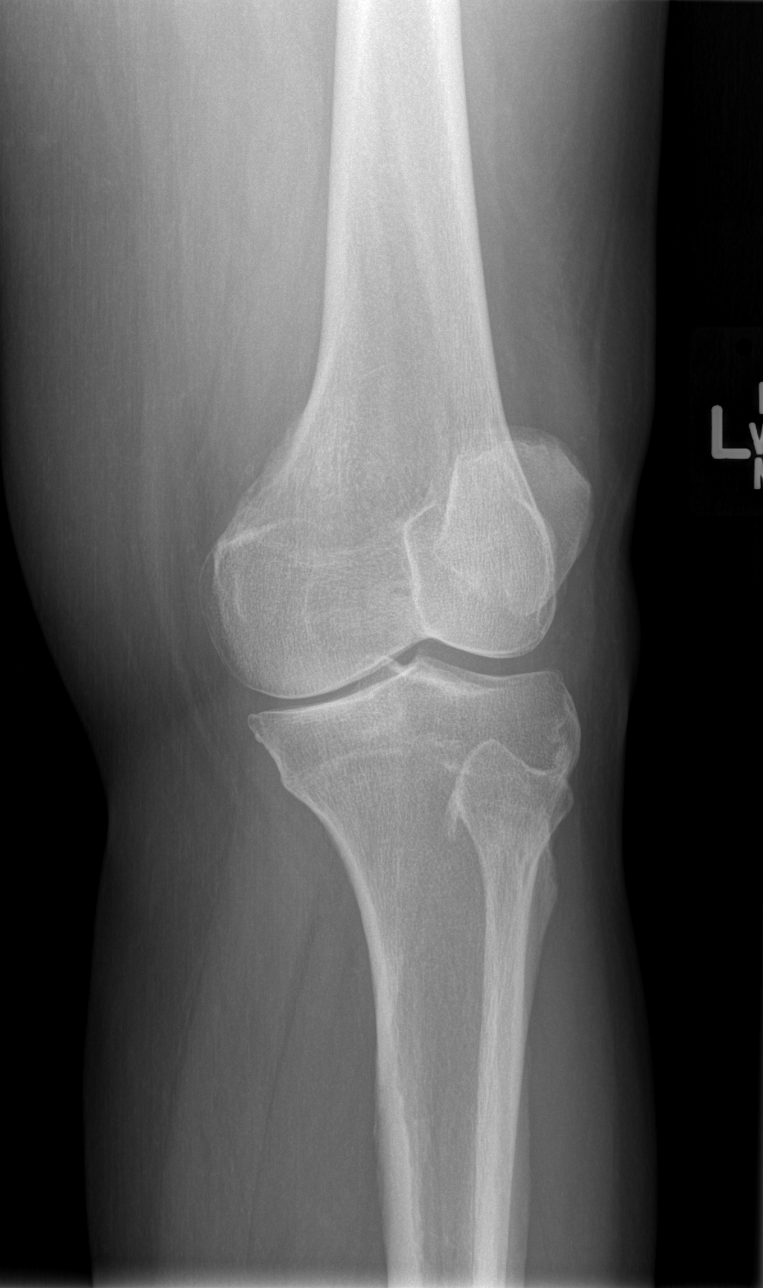

[t knee lat left]
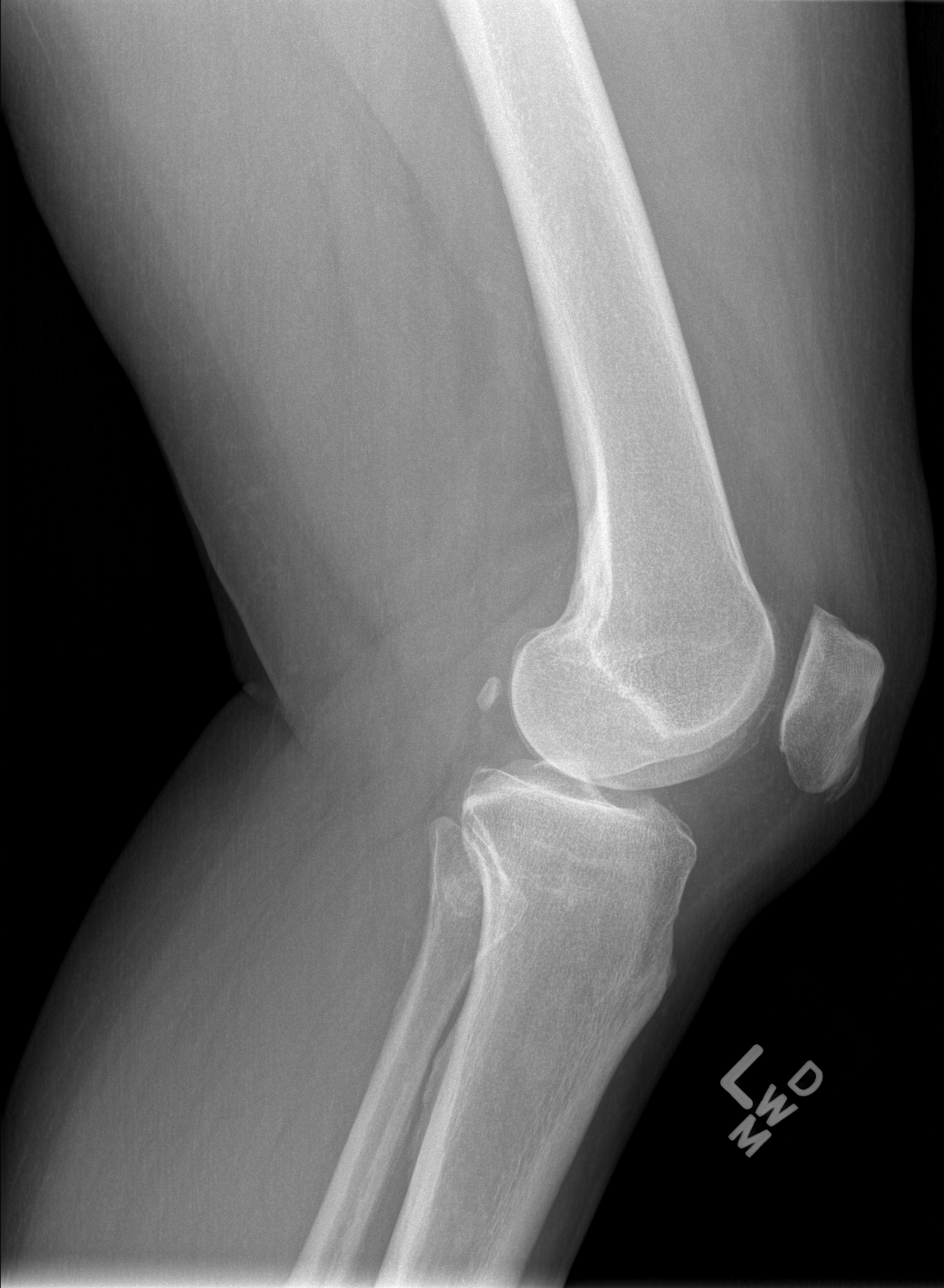

[4 of 4 positions shown; findings below may reference images not displayed]

FINDINGS: Minimal tibial spine spurring is noted. Trace
suprapatellar fluid.  Mild patellofemoral and medial compartmental
degenerative change.  No fracture or dislocation.
IMPRESSION: No acute abnormality.  Mild degenerative change.

## 2011-09-09 MED ORDER — IBUPROFEN 600 MG PO TABS: 600.0000 mg | ORAL_TABLET | Freq: Four times a day (QID) | ORAL | Status: AC | PRN

## 2011-09-09 MED ORDER — INSULIN ASPART PROT & ASPART (70-30 MIX) 100 UNIT/ML ~~LOC~~ SUSP
SUBCUTANEOUS | Status: AC
  Administered 2011-09-09: 10 [IU] via SUBCUTANEOUS
  Filled 2011-09-09: qty 3

## 2011-09-09 MED ORDER — OXYCODONE-ACETAMINOPHEN 5-325 MG PO TABS
1.0000 | ORAL_TABLET | Freq: Once | ORAL | Status: AC
  Administered 2011-09-09: 1 via ORAL
  Filled 2011-09-09: qty 1

## 2011-09-09 MED ORDER — OXYCODONE-ACETAMINOPHEN 5-325 MG PO TABS: 1.0000 | ORAL_TABLET | Freq: Four times a day (QID) | ORAL | Status: AC | PRN

## 2011-09-09 MED ORDER — INSULIN ASPART PROT & ASPART (70-30 MIX) 100 UNIT/ML ~~LOC~~ SUSP
10.0000 [IU] | Freq: Once | SUBCUTANEOUS | Status: AC
  Administered 2011-09-09: 10 [IU] via SUBCUTANEOUS

## 2011-09-09 NOTE — ED Provider Notes (Signed)
History     CSN: [id number]  Arrival date & time 09/09/11  1305   First MD Initiated Contact with Patient 09/09/11 1345      Chief Complaint  Patient presents with  . Hip Pain    (Consider location/radiation/quality/duration/timing/severity/associated sxs/prior treatment) HPI Pt presents with c/o pain in left hip and left knee.  She states she was trying to get out of bed 3 days ago and felt pain with movement and had difficulty getting out of bed.  Nursing notes state that she fell, but she denies this to me.  She did not know of any traumatic injuries- no MVC or falls.  She denies neck or back pain.  No weakness of leg or numbness.  No fevers.  Pain is constant and aching- she has noted her left knee to be swollen, pain over lateral left hip as well.  No swelling distally.  Movement and palpation makes pain worse, there are no other associated systemic symptoms.   Past Medical History  Diagnosis Date  . Bronchitis   . Diabetic neuropathy   . Diabetic retinopathy   . Arthritis   . Hypertension   . Hyperlipemia   . Obesity   . Diabetes mellitus     Past Surgical History  Procedure Date  . Cesarean section     x3  . Cholecystectomy     History reviewed. No pertinent family history.  History  Substance Use Topics  . Smoking status: Never Smoker   . Smokeless tobacco: Never Used  . Alcohol Use: No    OB History    Grav Para Term Preterm Abortions TAB SAB Ect Mult Living                  Review of Systems ROS reviewed and all otherwise negative except for mentioned in HPI  Allergies  Morphine and related  Home Medications   Current Outpatient Rx  Name Route Sig Dispense Refill  . ATENOLOL 25 MG PO TABS Oral Take 25 mg by mouth daily.    Marland Kitchen FLUOXETINE HCL 20 MG PO CAPS Oral Take 20 mg by mouth daily.    Marland Kitchen GABAPENTIN 300 MG PO CAPS Oral Take 300 mg by mouth 3 (three) times daily.    . GLYBURIDE 1.25 MG PO TABS Oral Take 1.25 mg by mouth daily with breakfast.     . INSULIN GLARGINE 100 UNIT/ML  SOLN Subcutaneous Inject 20 Units into the skin at bedtime.    Marland Kitchen METFORMIN HCL ER (MOD) 1000 MG PO TB24 Oral Take 1,000 mg by mouth 3 (three) times daily.    . IBUPROFEN 600 MG PO TABS Oral Take 1 tablet (600 mg total) by mouth every 6 (six) hours as needed for pain. 30 tablet 0  . METHOCARBAMOL 750 MG PO TABS Oral Take 375 mg by mouth as needed.    . OXYCODONE-ACETAMINOPHEN 5-325 MG PO TABS Oral Take 1-2 tablets by mouth every 6 (six) hours as needed for pain. 15 tablet 0    BP 159/75  Pulse 81  Temp 98 F (36.7 C) (Oral)  Resp 20  Ht 5\' 2"  (1.575 m)  Wt 201 lb (91.173 kg)  BMI 36.76 kg/m2  SpO2 100% Vitals reviewed Physical Exam Physical Examination: General appearance - alert, well appearing, and in no distress Mental status - alert, oriented to person, place, and time Neck - supple, no significant adenopathy, no midline tenderness Chest - clear to auscultation, no wheezes, rales or rhonchi, symmetric air entry  Heart - normal rate, regular rhythm, normal S1, S2, no murmurs, rubs, clicks or gallops Abdomen - soft, nontender, nondistended, no masses or organomegaly Back exam - full range of motion, no tenderness, palpable spasm or pain on motion Neurological - alert, oriented, normal speech, strength 5/5 in extremities x 4, sensation intact distally Musculoskeletal - no deformity, some swelling around left knee with balottable effusion- mild, no overlying erythema, mild pain with ROM of left knee- tender overlying patella, pain with ROM of left hip- no bony point tenderness Extremities - peripheral pulses normal, no pedal edema, no clubbing or cyanosis Skin - normal coloration and turgor, no rashes  ED Course  Procedures (including critical care time)  4:10 PM repeat CBG 232 after insulin, urine without ketones.  Pt has missed her 2nd metformin dose for today.    Labs Reviewed  GLUCOSE, CAPILLARY - Abnormal; Notable for the following:     Glucose-Capillary 329 (*)     All other components within normal limits  URINALYSIS, ROUTINE W REFLEX MICROSCOPIC - Abnormal; Notable for the following:    Glucose, UA >1000 (*)     All other components within normal limits  URINE MICROSCOPIC-ADD ON - Abnormal; Notable for the following:    Squamous Epithelial / LPF FEW (*)     Bacteria, UA FEW (*)     All other components within normal limits  GLUCOSE, CAPILLARY - Abnormal; Notable for the following:    Glucose-Capillary 232 (*)     All other components within normal limits   Dg Hip Complete Left  09/09/2011  *RADIOLOGY REPORT*  Clinical Data: Left hip pain  LEFT HIP - COMPLETE 2+ VIEW  Comparison: None.  Findings: Dystrophic or soft tissue ossification/calcification incidentally noted over the right hip.  No displaced pelvic fracture.  SI joints are normal.  No left hip fracture or dislocation.  IMPRESSION: No acute abnormality.  Original Report Authenticated By: Harrel Lemon, M.D.   Dg Knee Complete 4 Views Left  09/09/2011  *RADIOLOGY REPORT*  Clinical Data: Left knee pain  LEFT KNEE - COMPLETE 4+ VIEW  Comparison: None.  Findings: Minimal tibial spine spurring is noted. Trace suprapatellar fluid.  Mild patellofemoral and medial compartmental degenerative change.  No fracture or dislocation.  IMPRESSION: No acute abnormality.  Mild degenerative change.  Original Report Authenticated By: Harrel Lemon, M.D.     1. Hip pain   2. Knee pain   3. Hyperglycemia   4. 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  Appears musculoskeletal in nature, no signs of septic joint.  Suspect osteoarthrits as cause of pain.  Also found to be hyperglycemic on arrival.  No ketones in urine- pt treated with subc insulin- she is overall nontoxic and well hydrated in appearance. Pt discharged with stirct return precautions, she is agreeable with this plan- given ibuprofen and percocet for  discomfort        Ethelda Chick, MD 09/10/11 1727

## 2011-09-09 NOTE — ED Notes (Signed)
Pt states she tried to get up out of bed Friday and felt a lot of pain in her left hip. No known injury.

## 2011-09-09 NOTE — Discharge Instructions (Signed)
Return to the ED with any concerns including fever, increased pain or swelling or redness overlying joints, vomiting, difficulty breathing, decreased level of alertness/lethargy, or any other alarming symptoms

## 2011-09-10 LAB — GLUCOSE, CAPILLARY: Glucose-Capillary: 232 mg/dL — ABNORMAL HIGH (ref 70–99)

## 2012-09-28 ENCOUNTER — Emergency Department (HOSPITAL_BASED_OUTPATIENT_CLINIC_OR_DEPARTMENT_OTHER)
Admission: EM | Admit: 2012-09-28 | Discharge: 2012-09-28 | Disposition: A | Discharge status: Home / Self Care | Payer: Medicaid Other | Attending: Emergency Medicine | Admitting: Emergency Medicine

## 2012-09-28 ENCOUNTER — Emergency Department (HOSPITAL_BASED_OUTPATIENT_CLINIC_OR_DEPARTMENT_OTHER): Payer: Medicaid Other

## 2012-09-28 ENCOUNTER — Encounter (HOSPITAL_BASED_OUTPATIENT_CLINIC_OR_DEPARTMENT_OTHER): Payer: Self-pay | Admitting: *Deleted

## 2012-09-28 DIAGNOSIS — E1142 Type 2 diabetes mellitus with diabetic polyneuropathy: Secondary | ICD-10-CM | POA: Insufficient documentation

## 2012-09-28 DIAGNOSIS — E669 Obesity, unspecified: Secondary | ICD-10-CM | POA: Insufficient documentation

## 2012-09-28 DIAGNOSIS — M129 Arthropathy, unspecified: Secondary | ICD-10-CM | POA: Insufficient documentation

## 2012-09-28 DIAGNOSIS — R109 Unspecified abdominal pain: Secondary | ICD-10-CM | POA: Insufficient documentation

## 2012-09-28 DIAGNOSIS — Z862 Personal history of diseases of the blood and blood-forming organs and certain disorders involving the immune mechanism: Secondary | ICD-10-CM | POA: Insufficient documentation

## 2012-09-28 DIAGNOSIS — E1149 Type 2 diabetes mellitus with other diabetic neurological complication: Secondary | ICD-10-CM | POA: Insufficient documentation

## 2012-09-28 DIAGNOSIS — I10 Essential (primary) hypertension: Secondary | ICD-10-CM | POA: Insufficient documentation

## 2012-09-28 DIAGNOSIS — R1110 Vomiting, unspecified: Secondary | ICD-10-CM

## 2012-09-28 DIAGNOSIS — Z8639 Personal history of other endocrine, nutritional and metabolic disease: Secondary | ICD-10-CM | POA: Insufficient documentation

## 2012-09-28 DIAGNOSIS — Z79899 Other long term (current) drug therapy: Secondary | ICD-10-CM | POA: Insufficient documentation

## 2012-09-28 DIAGNOSIS — Z794 Long term (current) use of insulin: Secondary | ICD-10-CM | POA: Insufficient documentation

## 2012-09-28 LAB — URINALYSIS, ROUTINE W REFLEX MICROSCOPIC
Bilirubin Urine: NEGATIVE
Glucose, UA: >1000 mg/dL — AB
Ketones, ur: NEGATIVE mg/dL
Leukocytes, UA: NEGATIVE
Nitrite: NEGATIVE
Protein, ur: NEGATIVE mg/dL

## 2012-09-28 LAB — CBC WITH DIFFERENTIAL/PLATELET
Basophils Absolute: 0 10*3/uL (ref 0.0–0.1)
Basophils Relative: 0 % (ref 0–1)
Eosinophils Absolute: 0.2 10*3/uL (ref 0.0–0.7)
Eosinophils Relative: 2 % (ref 0–5)
Lymphs Abs: 3 10*3/uL (ref 0.7–4.0)
MCH: 28.5 pg (ref 26.0–34.0)
MCHC: 34.4 g/dL (ref 30.0–36.0)
MCV: 83 fL (ref 78.0–100.0)
Neutrophils Relative %: 60 % (ref 43–77)
Platelets: 394 10*3/uL (ref 150–400)
RBC: 5.12 MIL/uL — ABNORMAL HIGH (ref 3.87–5.11)
RDW: 13.1 % (ref 11.5–15.5)

## 2012-09-28 LAB — COMPREHENSIVE METABOLIC PANEL
AST: 13 U/L (ref 0–37)
Albumin: 4.1 g/dL (ref 3.5–5.2)
Alkaline Phosphatase: 90 U/L (ref 39–117)
CO2: 26 mEq/L (ref 19–32)
Chloride: 91 mEq/L — ABNORMAL LOW (ref 96–112)
Creatinine, Ser: 0.7 mg/dL (ref 0.50–1.10)
GFR calc non Af Amer: >90 mL/min (ref >90)
Potassium: 3.7 mEq/L (ref 3.5–5.1)
Total Bilirubin: 0.4 mg/dL (ref 0.3–1.2)

## 2012-09-28 LAB — URINE MICROSCOPIC-ADD ON

## 2012-09-28 LAB — LIPASE, BLOOD: Lipase: 25 U/L (ref 11–59)

## 2012-09-28 IMAGING — CT CT ABD-PELV W/ CM
2 of 5 series · 17 of 46 positions shown, 19 images · IV contrast (APPLIED)
Comparison: [DATE]

CLINICAL DATA: Right-sided abdominal pain.

CT ABDOMEN AND PELVIS WITH CONTRAST
TECHNIQUE: Multidetector CT imaging of the abdomen and pelvis was
performed following the standard protocol during bolus
administration of intravenous contrast.
Contrast: 100mL OMNIPAQUE IOHEXOL 300 MG/ML  SOLN

[Series 2: abd/pelvis 5.0 b31f · axial · 0.74mm/px · z∈[-547,-142]mm · 14 of 91 slices shown, 16 images]
[im 5/91  soft-tissue]
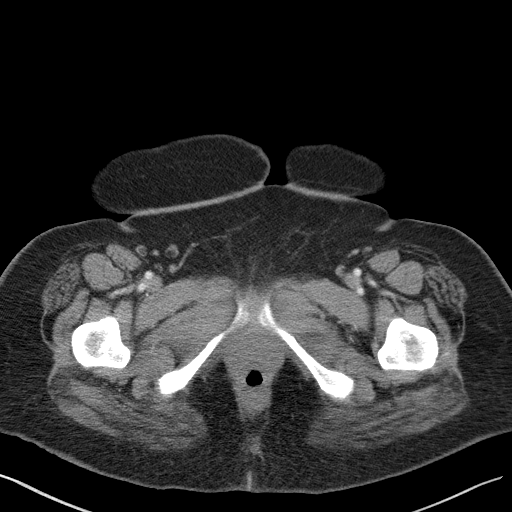
[im 5/91  bone]
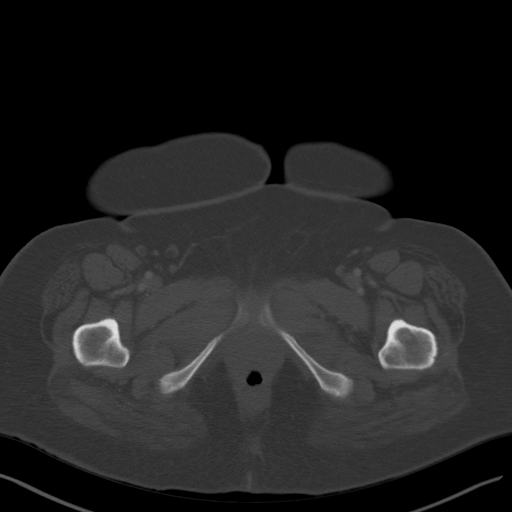
[im 10/91  soft-tissue]
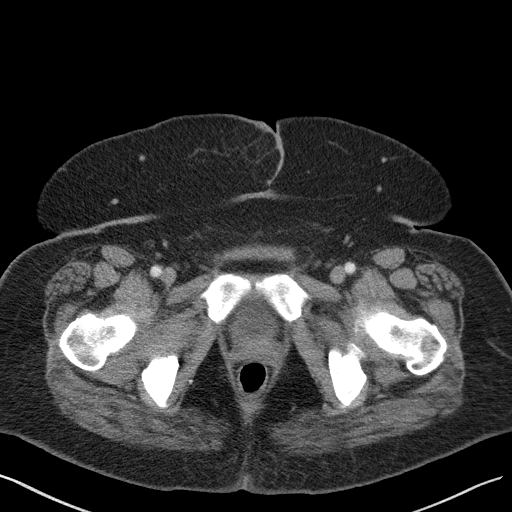
[im 19/91  soft-tissue]
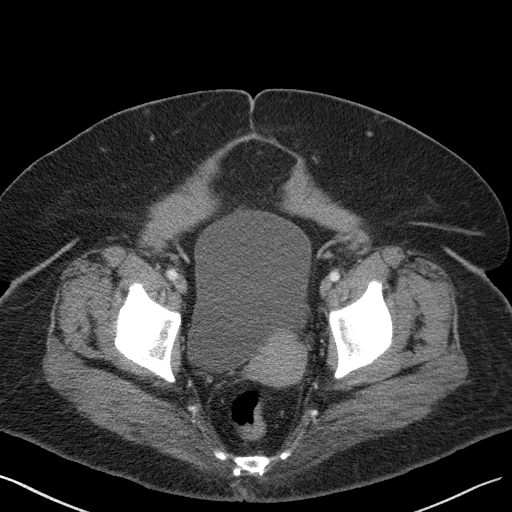
[im 24/91  soft-tissue]
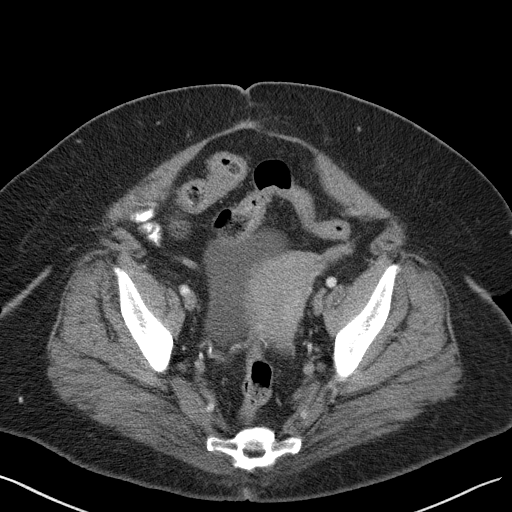
[im 29/91  soft-tissue]
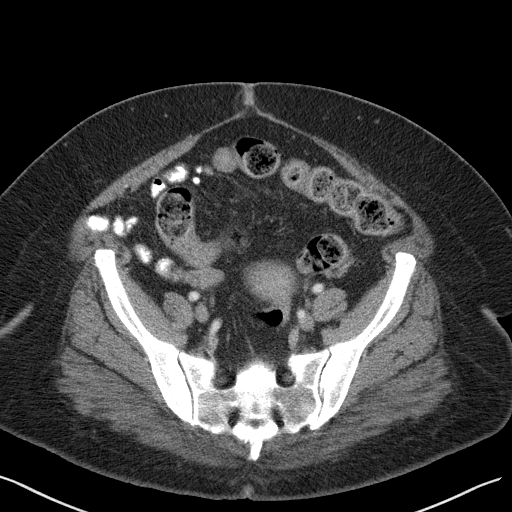
[im 38/91  soft-tissue]
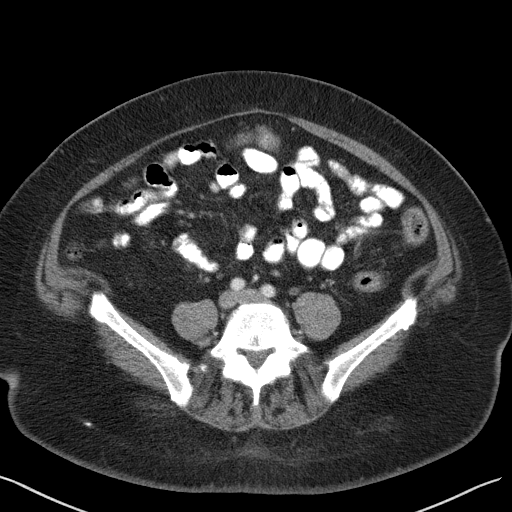
[im 43/91  soft-tissue]
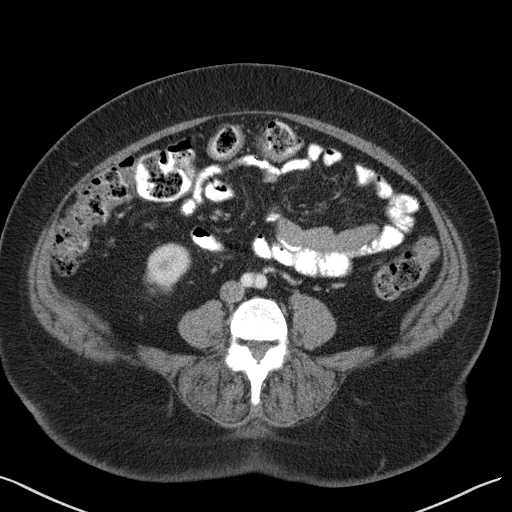
[im 48/91  soft-tissue]
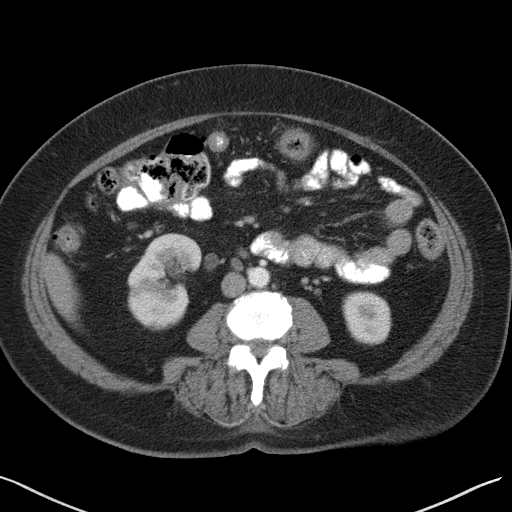
[im 53/91  soft-tissue]
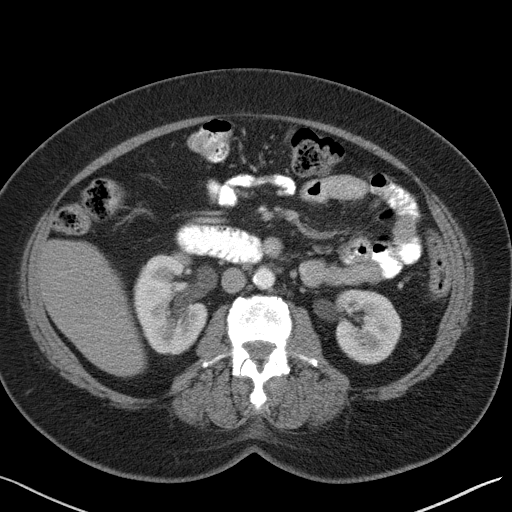
[im 53/91  bone]
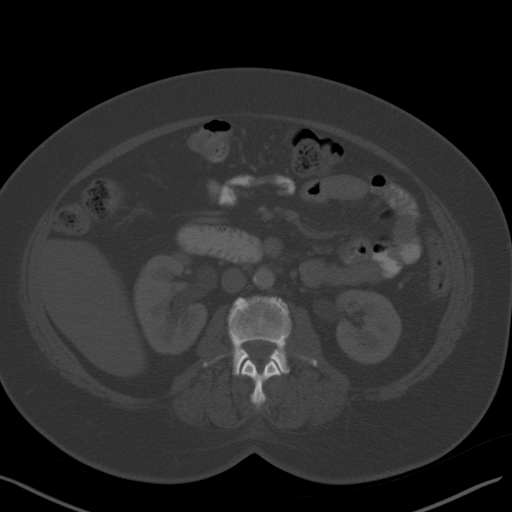
[im 62/91  soft-tissue]
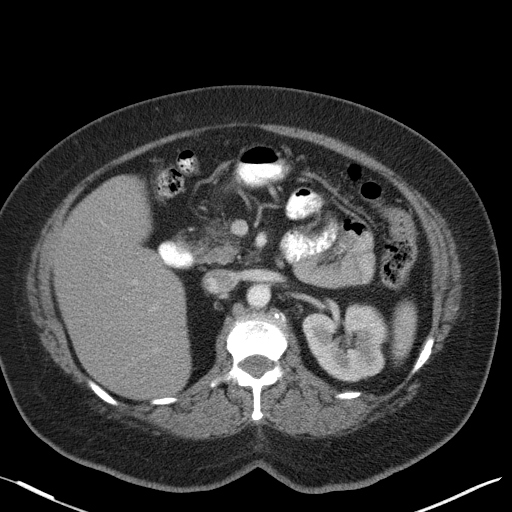
[im 67/91  soft-tissue]
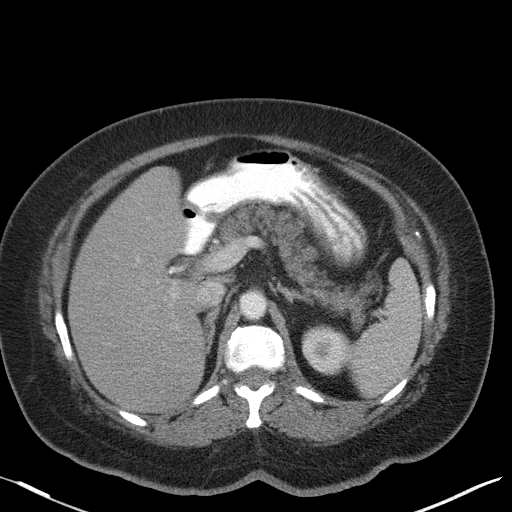
[im 72/91  soft-tissue]
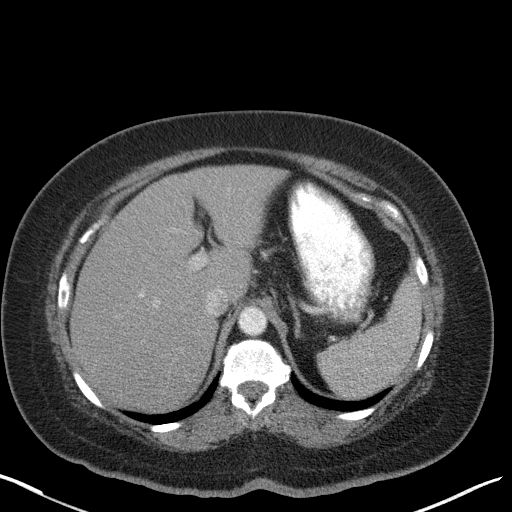
[im 81/91  soft-tissue]
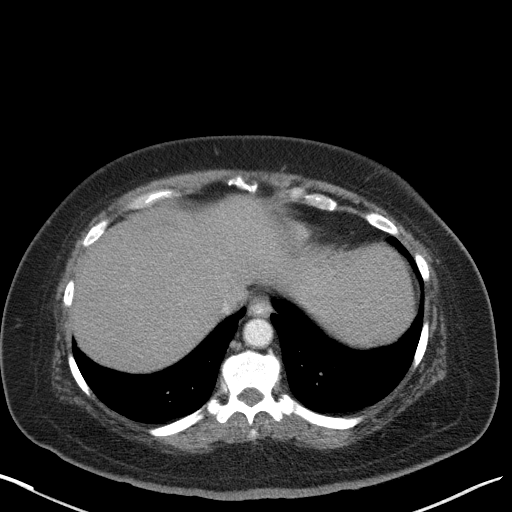
[im 86/91  soft-tissue]
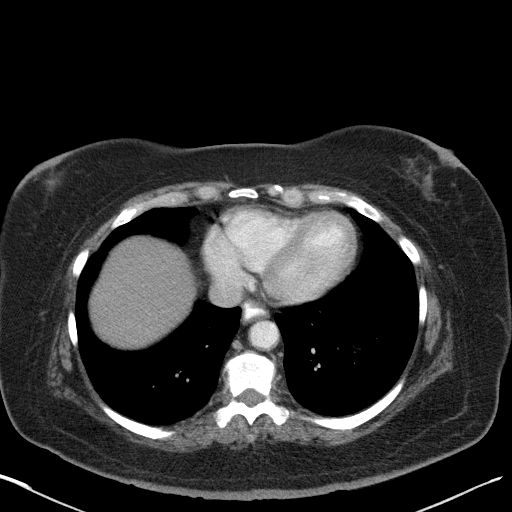

[Series 5: abd/pelvis 3.0 coronal · coronal · 0.94mm/px · 3 of 86 slices shown]
[im 29/86  soft-tissue]
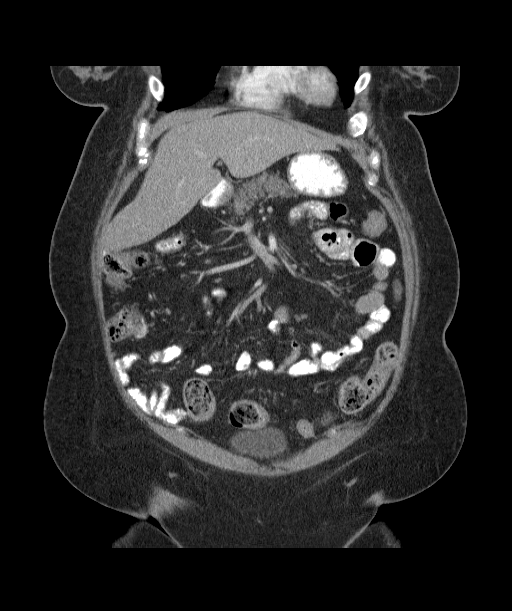
[im 38/86  soft-tissue]
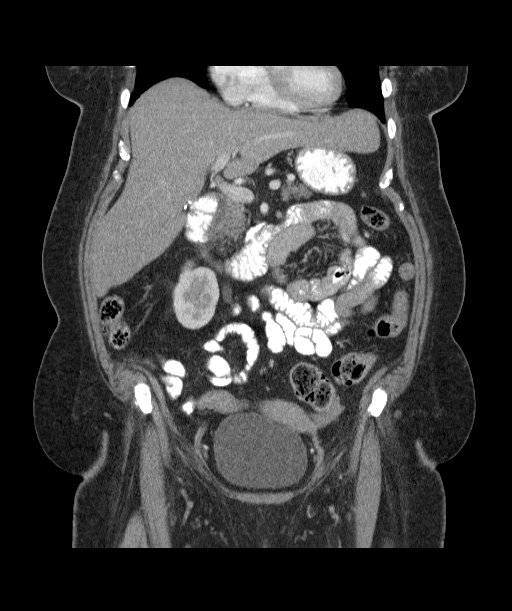
[im 48/86  soft-tissue]
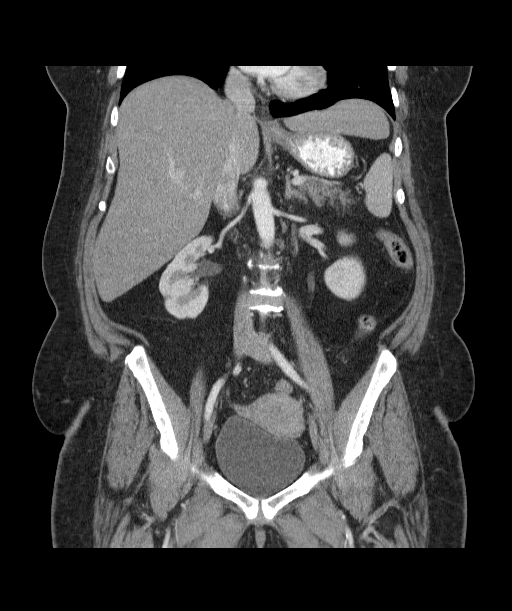

[17 of 46 positions shown; findings below may reference images not displayed]

FINDINGS: Lung bases are clear.  No effusions.  Heart is normal
size.

Liver, spleen, pancreas, adrenals and kidneys have an unremarkable
appearance.

Uterus, adnexa urinary bladder are unremarkable.  No adnexal
masses.  Stomach, large and small bowel grossly unremarkable.  No
free fluid, free air or adenopathy.  Aorta is normal caliber.
Appendix is not definitively visualized.  Question prior
appendectomy.  No inflammatory process in the right lower quadrant.
2 fat containing midline ventral hernias are again noted,
unchanged.  No acute bony abnormality.
IMPRESSION: No acute findings in the abdomen or pelvis.

## 2012-09-28 MED ORDER — INSULIN REGULAR HUMAN 100 UNIT/ML IJ SOLN
INTRAMUSCULAR | Status: AC
  Administered 2012-09-28: 8 [IU] via SUBCUTANEOUS
  Filled 2012-09-28: qty 1

## 2012-09-28 MED ORDER — ONDANSETRON HCL 4 MG/2ML IJ SOLN
4.0000 mg | Freq: Once | INTRAMUSCULAR | Status: AC
  Administered 2012-09-28: 4 mg via INTRAVENOUS
  Filled 2012-09-28: qty 2

## 2012-09-28 MED ORDER — ONDANSETRON 8 MG PO TBDP: ORAL_TABLET | ORAL | Status: DC

## 2012-09-28 MED ORDER — INSULIN REGULAR HUMAN 100 UNIT/ML IJ SOLN
8.0000 [IU] | Freq: Once | INTRAMUSCULAR | Status: AC
  Administered 2012-09-28: 8 [IU] via SUBCUTANEOUS

## 2012-09-28 MED ORDER — ONDANSETRON HCL 4 MG/2ML IJ SOLN
4.0000 mg | Freq: Once | INTRAMUSCULAR | Status: AC
  Administered 2012-09-28: 4 mg via INTRAVENOUS

## 2012-09-28 MED ORDER — PROMETHAZINE HCL 25 MG/ML IJ SOLN
12.5000 mg | Freq: Once | INTRAMUSCULAR | Status: AC
  Administered 2012-09-28: 12.5 mg via INTRAVENOUS
  Filled 2012-09-28: qty 1

## 2012-09-28 MED ORDER — HYDROCODONE-ACETAMINOPHEN 5-325 MG PO TABS: 2.0000 | ORAL_TABLET | ORAL | Status: DC | PRN

## 2012-09-28 MED ORDER — IOHEXOL 300 MG/ML  SOLN
100.0000 mL | Freq: Once | INTRAMUSCULAR | Status: AC | PRN
  Administered 2012-09-28: 100 mL via INTRAVENOUS

## 2012-09-28 MED ORDER — ONDANSETRON HCL 4 MG/2ML IJ SOLN
INTRAMUSCULAR | Status: AC
  Administered 2012-09-28: 4 mg via INTRAVENOUS
  Filled 2012-09-28: qty 2

## 2012-09-28 MED ORDER — IOHEXOL 300 MG/ML  SOLN
50.0000 mL | Freq: Once | INTRAMUSCULAR | Status: AC | PRN
  Administered 2012-09-28: 50 mL via ORAL

## 2012-09-28 NOTE — ED Provider Notes (Signed)
History    This chart was scribed for Geoffery Lyons, MD by Quintella Reichert, ED scribe.  This patient was seen in room MH04/MH04 and the patient's care was started at 3:14 PM.  CSN: [id number]  Arrival date & time 09/28/12  1258    Chief Complaint  Patient presents with  . Emesis    The history is provided by the patient. No language interpreter was used.    HPI Comments: Anna [name] is a 57 y.o. female with h/o GERD, DM, and bronchitis who presents to the Emergency Department complaining of constant, gradual-onset, gradually-worsening abdominal pain that began one week ago. Pt characterizes pain as severe muscle spasms localized to two points of her upper abdomen.  She also notes she is only able to tolerate bland foods and has had trouble drinking fluids other than water.  She states that it feels like she is hungry but her stomach feels full.  She also reports one episode of diarrhea this morning and she notes that she vomited one time on the way to the ED after taking Prilosec this morning.  She denies constipation, dysuria, fever, or any other associated symptoms.  She has not consulted with her PCP in regard to her symptoms.  Pt admits to a h/o peptic ulcer disease that was exacerbated by past Coumadin usage but she states that she has not experienced any related complications since she was taken off of Coumadin.  She also admits to h/o cholecystectomy 12-13 years ago.  She denies h/o appendectomy and has not had a colonoscopy.  Pt medicates regularly with Tramadol, Prilosec and Meloxicam.     Past Medical History  Diagnosis Date  . Bronchitis   . Diabetic neuropathy   . Diabetic retinopathy   . Arthritis   . Hypertension   . Hyperlipemia   . Obesity   . Diabetes mellitus    Past Surgical History  Procedure Laterality Date  . Cesarean section      x3  . Cholecystectomy    . Eye surgery Left    No family history on file. History  Substance Use Topics  . Smoking  status: Never Smoker   . Smokeless tobacco: Never Used  . Alcohol Use: No   OB History   Grav Para Term Preterm Abortions TAB SAB Ect Mult Living                 Review of Systems A complete 10 system review of systems was obtained and all systems are negative except as noted in the HPI and PMH.    Allergies  Morphine and related  Home Medications   Current Outpatient Rx  Name  Route  Sig  Dispense  Refill  . Amphetamine-Dextroamphetamine (ADDERALL PO)   Oral   Take by mouth.         . Cyclobenzaprine HCl (FLEXERIL PO)   Oral   Take by mouth.         Marland Kitchen HYDROCHLOROTHIAZIDE PO   Oral   Take by mouth.         . insulin regular (NOVOLIN R,HUMULIN R) 100 units/mL injection   Subcutaneous   Inject into the skin 3 (three) times daily before meals.         . MELOXICAM PO   Oral   Take by mouth.         . Omeprazole (PRILOSEC PO)   Oral   Take by mouth.         . Potassium  Chloride Crys CR (K-DUR PO)   Oral   Take by mouth.         . TRAMADOL HCL PO   Oral   Take by mouth.         Marland Kitchen atenolol (TENORMIN) 25 MG tablet   Oral   Take 25 mg by mouth daily.         Marland Kitchen FLUoxetine (PROZAC) 20 MG capsule   Oral   Take 20 mg by mouth daily.         Marland Kitchen gabapentin (NEURONTIN) 300 MG capsule   Oral   Take 300 mg by mouth 3 (three) times daily.         Marland Kitchen glyBURIDE (DIABETA) 1.25 MG tablet   Oral   Take 1.25 mg by mouth daily with breakfast.         . insulin glargine (LANTUS) 100 UNIT/ML injection   Subcutaneous   Inject 80 Units into the skin at bedtime.          . metFORMIN (GLUMETZA) 1000 MG (MOD) 24 hr tablet   Oral   Take 1,000 mg by mouth 3 (three) times daily.         . methocarbamol (ROBAXIN) 750 MG tablet   Oral   Take 375 mg by mouth as needed.          There were no vitals taken for this visit.  Physical Exam  Nursing note and vitals reviewed. Constitutional: She is oriented to person, place, and time. She appears  well-developed and well-nourished. No distress.  HENT:  Head: Normocephalic and atraumatic.  Eyes: EOM are normal.  Neck: Neck supple. No tracheal deviation present.  Cardiovascular: Normal rate, regular rhythm and normal heart sounds.   No murmur heard. Pulmonary/Chest: Effort normal and breath sounds normal. No respiratory distress. She has no wheezes. She has no rales.  Abdominal: Soft. Bowel sounds are normal. She exhibits no distension. There is tenderness (Tenderness in RLQ and epigastric region. ). There is no rebound and no guarding.  Musculoskeletal: Normal range of motion.  Neurological: She is alert and oriented to person, place, and time.  Skin: Skin is warm and dry.  Psychiatric: She has a normal mood and affect. Her behavior is normal.    ED Course  Procedures (including critical care time)  DIAGNOSTIC STUDIES: Oxygen Saturation is 99% on room air, normal by my interpretation.    COORDINATION OF CARE: 3:18 PM-Discussed treatment plan which includes CT abdomen with pt at bedside and pt agreed to plan.    Labs Reviewed  URINALYSIS, ROUTINE W REFLEX MICROSCOPIC - Abnormal; Notable for the following:    Glucose, UA >1000 (*)    All other components within normal limits  URINE MICROSCOPIC-ADD ON - Abnormal; Notable for the following:    Squamous Epithelial / LPF FEW (*)    Bacteria, UA FEW (*)    All other components within normal limits  COMPREHENSIVE METABOLIC PANEL - Abnormal; Notable for the following:    Sodium 133 (*)    Chloride 91 (*)    Glucose, Bld 506 (*)    Calcium 11.0 (*)    All other components within normal limits  CBC WITH DIFFERENTIAL - Abnormal; Notable for the following:    RBC 5.12 (*)    All other components within normal limits  LIPASE, BLOOD   Results for orders placed during the hospital encounter of 09/28/12  URINALYSIS, ROUTINE W REFLEX MICROSCOPIC      Result Value Range  Color, Urine YELLOW  YELLOW   APPearance CLEAR  CLEAR    Specific Gravity, Urine 1.026  1.005 - 1.030   pH 5.0  5.0 - 8.0   Glucose, UA >1000 (*) NEGATIVE mg/dL   Hgb urine dipstick NEGATIVE  NEGATIVE   Bilirubin Urine NEGATIVE  NEGATIVE   Ketones, ur NEGATIVE  NEGATIVE mg/dL   Protein, ur NEGATIVE  NEGATIVE mg/dL   Urobilinogen, UA 0.2  0.0 - 1.0 mg/dL   Nitrite NEGATIVE  NEGATIVE   Leukocytes, UA NEGATIVE  NEGATIVE  URINE MICROSCOPIC-ADD ON      Result Value Range   Squamous Epithelial / LPF FEW (*) RARE   WBC, UA 0-2  <3 WBC/hpf   Bacteria, UA FEW (*) RARE   Urine-Other MANY YEAST    COMPREHENSIVE METABOLIC PANEL      Result Value Range   Sodium 133 (*) 135 - 145 mEq/L   Potassium 3.7  3.5 - 5.1 mEq/L   Chloride 91 (*) 96 - 112 mEq/L   CO2 26  19 - 32 mEq/L   Glucose, Bld 506 (*) 70 - 99 mg/dL   BUN 14  6 - 23 mg/dL   Creatinine, Ser 9.56  0.50 - 1.10 mg/dL   Calcium 21.3 (*) 8.4 - 10.5 mg/dL   Total Protein 8.3  6.0 - 8.3 g/dL   Albumin 4.1  3.5 - 5.2 g/dL   AST 13  0 - 37 U/L   ALT 13  0 - 35 U/L   Alkaline Phosphatase 90  39 - 117 U/L   Total Bilirubin 0.4  0.3 - 1.2 mg/dL   GFR calc non Af Amer >90  >90 mL/min   GFR calc Af Amer >90  >90 mL/min  CBC WITH DIFFERENTIAL      Result Value Range   WBC 9.6  4.0 - 10.5 K/uL   RBC 5.12 (*) 3.87 - 5.11 MIL/uL   Hemoglobin 14.6  12.0 - 15.0 g/dL   HCT 08.6  57.8 - 46.9 %   MCV 83.0  78.0 - 100.0 fL   MCH 28.5  26.0 - 34.0 pg   MCHC 34.4  30.0 - 36.0 g/dL   RDW 62.9  52.8 - 41.3 %   Platelets 394  150 - 400 K/uL   Neutrophils Relative % 60  43 - 77 %   Neutro Abs 5.8  1.7 - 7.7 K/uL   Lymphocytes Relative 32  12 - 46 %   Lymphs Abs 3.0  0.7 - 4.0 K/uL   Monocytes Relative 6  3 - 12 %   Monocytes Absolute 0.6  0.1 - 1.0 K/uL   Eosinophils Relative 2  0 - 5 %   Eosinophils Absolute 0.2  0.0 - 0.7 K/uL   Basophils Relative 0  0 - 1 %   Basophils Absolute 0.0  0.0 - 0.1 K/uL  LIPASE, BLOOD      Result Value Range   Lipase 25  11 - 59 U/L    Ct Abdomen Pelvis W  Contrast  09/28/2012   *RADIOLOGY REPORT*  Clinical Data: Right-sided abdominal pain.  CT ABDOMEN AND PELVIS WITH CONTRAST  Technique:  Multidetector CT imaging of the abdomen and pelvis was performed following the standard protocol during bolus administration of intravenous contrast.  Contrast: OMNIPAQUE IOHEXOL 300 MG/ML  SOLN  Comparison: 09/03/2010  Findings: Lung bases are clear.  No effusions.  Heart is normal size.  Liver, spleen, pancreas, adrenals and kidneys have an unremarkable  appearance.  Uterus, adnexa urinary bladder are unremarkable.  No adnexal masses.  Stomach, large and small bowel grossly unremarkable.  No free fluid, free air or adenopathy.  Aorta is normal caliber. Appendix is not definitively visualized.  Question prior appendectomy.  No inflammatory process in the right lower quadrant. 2 fat containing midline ventral hernias are again noted, unchanged.  No acute bony abnormality.  IMPRESSION: No acute findings in the abdomen or pelvis.   Original Report Authenticated By: Charlett Nose, M.D.    No diagnosis found.   MDM  The ct shows no acute intra-abdominal pathology.  She is feeling better with the medications and fluids given in the ED.  I will discharge her to home with pain and nausea medications, return prn if she worsens.    I personally performed the services described in this documentation, which was scribed in my presence. The recorded information has been reviewed and is accurate.     Geoffery Lyons, MD 09/28/12 [phone]

## 2012-09-28 NOTE — ED Notes (Signed)
Patient states she has been experiencing bloating & abd pain all week, BM today, took prilosec this morning, but started vomiting during ride to ER

## 2012-11-01 ENCOUNTER — Emergency Department (HOSPITAL_BASED_OUTPATIENT_CLINIC_OR_DEPARTMENT_OTHER)
Admission: EM | Admit: 2012-11-01 | Discharge: 2012-11-02 | Disposition: A | Discharge status: Home / Self Care | Payer: Medicaid Other | Attending: Emergency Medicine | Admitting: Emergency Medicine

## 2012-11-01 ENCOUNTER — Emergency Department (HOSPITAL_BASED_OUTPATIENT_CLINIC_OR_DEPARTMENT_OTHER): Payer: Medicaid Other

## 2012-11-01 ENCOUNTER — Encounter (HOSPITAL_BASED_OUTPATIENT_CLINIC_OR_DEPARTMENT_OTHER): Payer: Self-pay | Admitting: Emergency Medicine

## 2012-11-01 DIAGNOSIS — Z79899 Other long term (current) drug therapy: Secondary | ICD-10-CM | POA: Insufficient documentation

## 2012-11-01 DIAGNOSIS — E1142 Type 2 diabetes mellitus with diabetic polyneuropathy: Secondary | ICD-10-CM | POA: Insufficient documentation

## 2012-11-01 DIAGNOSIS — I10 Essential (primary) hypertension: Secondary | ICD-10-CM | POA: Insufficient documentation

## 2012-11-01 DIAGNOSIS — E669 Obesity, unspecified: Secondary | ICD-10-CM | POA: Insufficient documentation

## 2012-11-01 DIAGNOSIS — Z862 Personal history of diseases of the blood and blood-forming organs and certain disorders involving the immune mechanism: Secondary | ICD-10-CM | POA: Insufficient documentation

## 2012-11-01 DIAGNOSIS — Z8709 Personal history of other diseases of the respiratory system: Secondary | ICD-10-CM | POA: Insufficient documentation

## 2012-11-01 DIAGNOSIS — Z794 Long term (current) use of insulin: Secondary | ICD-10-CM | POA: Insufficient documentation

## 2012-11-01 DIAGNOSIS — M533 Sacrococcygeal disorders, not elsewhere classified: Secondary | ICD-10-CM

## 2012-11-01 DIAGNOSIS — Z8739 Personal history of other diseases of the musculoskeletal system and connective tissue: Secondary | ICD-10-CM | POA: Insufficient documentation

## 2012-11-01 DIAGNOSIS — E119 Type 2 diabetes mellitus without complications: Secondary | ICD-10-CM | POA: Insufficient documentation

## 2012-11-01 DIAGNOSIS — Z8639 Personal history of other endocrine, nutritional and metabolic disease: Secondary | ICD-10-CM | POA: Insufficient documentation

## 2012-11-01 DIAGNOSIS — E1149 Type 2 diabetes mellitus with other diabetic neurological complication: Secondary | ICD-10-CM | POA: Insufficient documentation

## 2012-11-01 LAB — URINALYSIS, ROUTINE W REFLEX MICROSCOPIC
Bilirubin Urine: NEGATIVE
Glucose, UA: >1000 mg/dL — AB
Hgb urine dipstick: NEGATIVE
Ketones, ur: NEGATIVE mg/dL
Leukocytes, UA: NEGATIVE
Nitrite: NEGATIVE
Protein, ur: NEGATIVE mg/dL
Specific Gravity, Urine: 1.038 — ABNORMAL HIGH (ref 1.005–1.030)
Urobilinogen, UA: 1 mg/dL (ref 0.0–1.0)
pH: 6 (ref 5.0–8.0)

## 2012-11-01 LAB — URINE MICROSCOPIC-ADD ON

## 2012-11-01 MED ORDER — FLUCONAZOLE 50 MG PO TABS
150.0000 mg | ORAL_TABLET | Freq: Once | ORAL | Status: AC
  Administered 2012-11-02: 150 mg via ORAL
  Filled 2012-11-01: qty 1

## 2012-11-01 NOTE — ED Provider Notes (Signed)
CSN: [id number]     Arrival date & time 11/01/12  2148 History     First MD Initiated Contact with Patient 11/01/12 2321     Chief Complaint  Patient presents with  . Back Pain   (Consider location/radiation/quality/duration/timing/severity/associated sxs/prior Treatment) HPI This is a 57 year old female with a three-day history of pain in her coccyx. The onset was sudden while she was carrying her great-grandchild. She is not aware of any specific trauma. The pain is moderate to severe, worse with palpation, ambulation or sitting. It sometimes radiates to her buttocks. It also radiates around to her groins bilaterally. There is no associated bowel or bladder changes. There is no associated numbness or weakness. She has tried [employer] and heating pad without relief. She is out of pain medication.  Past Medical History  Diagnosis Date  . Bronchitis   . Diabetic neuropathy   . Diabetic retinopathy   . Arthritis   . Hypertension   . Hyperlipemia   . Obesity   . Diabetes mellitus    Past Surgical History  Procedure Laterality Date  . Cesarean section      x3  . Cholecystectomy    . Eye surgery Left    History reviewed. No pertinent family history. History  Substance Use Topics  . Smoking status: Never Smoker   . Smokeless tobacco: Never Used  . Alcohol Use: No   OB History   Grav Para Term Preterm Abortions TAB SAB Ect Mult Living                 Review of Systems  All other systems reviewed and are negative.    Allergies  Morphine and related; Ciprofloxacin; and Soma  Home Medications   Current Outpatient Rx  Name  Route  Sig  Dispense  Refill  . esomeprazole (NEXIUM) 20 MG capsule   Oral   Take 20 mg by mouth daily before breakfast.         . Amphetamine-Dextroamphetamine (ADDERALL PO)   Oral   Take by mouth.         Marland Kitchen atenolol (TENORMIN) 25 MG tablet   Oral   Take 25 mg by mouth daily.         . Cyclobenzaprine HCl (FLEXERIL PO)   Oral   Take by  mouth.         Marland Kitchen FLUoxetine (PROZAC) 20 MG capsule   Oral   Take 20 mg by mouth daily.         Marland Kitchen gabapentin (NEURONTIN) 300 MG capsule   Oral   Take 300 mg by mouth 3 (three) times daily.         Marland Kitchen glyBURIDE (DIABETA) 1.25 MG tablet   Oral   Take 1.25 mg by mouth daily with breakfast.         . HYDROCHLOROTHIAZIDE PO   Oral   Take by mouth.         Marland Kitchen HYDROcodone-acetaminophen (NORCO) 5-325 MG per tablet   Oral   Take 2 tablets by mouth every 4 (four) hours as needed for pain.   12 tablet   0   . insulin glargine (LANTUS) 100 UNIT/ML injection   Subcutaneous   Inject 80 Units into the skin at bedtime.          . insulin regular (NOVOLIN R,HUMULIN R) 100 units/mL injection   Subcutaneous   Inject into the skin 3 (three) times daily before meals.         Marland Kitchen  MELOXICAM PO   Oral   Take by mouth.         . metFORMIN (GLUMETZA) 1000 MG (MOD) 24 hr tablet   Oral   Take 1,000 mg by mouth 3 (three) times daily.         . methocarbamol (ROBAXIN) 750 MG tablet   Oral   Take 375 mg by mouth as needed.         . Omeprazole (PRILOSEC PO)   Oral   Take by mouth.         . ondansetron (ZOFRAN ODT) 8 MG disintegrating tablet      8mg  ODT q4 hours prn nausea   6 tablet   1   . Potassium Chloride Crys CR (K-DUR PO)   Oral   Take by mouth.         . TRAMADOL HCL PO   Oral   Take by mouth.          BP 150/74  Temp(Src) 97.7 F (36.5 C)  Resp 18  SpO2 100%  Physical Exam General: Well-developed, well-nourished female in no acute distress; appearance consistent with age of record HENT: normocephalic, atraumatic Eyes: pupils equal round and reactive to light; extraocular muscles intact Neck: supple Heart: regular rate and rhythm Lungs: clear to auscultation bilaterally Abdomen: soft; nondistended; nontender Back: Coccygeal tenderness without crepitus Extremities: No deformity; full range of motion Neurologic: Awake, alert and oriented;  motor function intact in all extremities and symmetric; no facial droop; normal gait Skin: Warm and dry Psychiatric: Normal mood and affect    ED Course   Procedures (including critical care time)    MDM   Nursing notes and vitals signs, including pulse oximetry, reviewed.  Summary of this visit's results, reviewed by myself:  Labs:  Results for orders placed during the hospital encounter of 11/01/12 (from the past 24 hour(s))  URINALYSIS, ROUTINE W REFLEX MICROSCOPIC     Status: Abnormal   Collection Time    11/01/12 11:22 PM      Result Value Range   Color, Urine YELLOW  YELLOW   APPearance CLEAR  CLEAR   Specific Gravity, Urine 1.038 (*) 1.005 - 1.030   pH 6.0  5.0 - 8.0   Glucose, UA >1000 (*) NEGATIVE mg/dL   Hgb urine dipstick NEGATIVE  NEGATIVE   Bilirubin Urine NEGATIVE  NEGATIVE   Ketones, ur NEGATIVE  NEGATIVE mg/dL   Protein, ur NEGATIVE  NEGATIVE mg/dL   Urobilinogen, UA 1.0  0.0 - 1.0 mg/dL   Nitrite NEGATIVE  NEGATIVE   Leukocytes, UA NEGATIVE  NEGATIVE  URINE MICROSCOPIC-ADD ON     Status: Abnormal   Collection Time    11/01/12 11:22 PM      Result Value Range   Squamous Epithelial / LPF FEW (*) RARE   WBC, UA 0-2  <3 WBC/hpf   RBC / HPF 0-2  <3 RBC/hpf   Bacteria, UA FEW (*) RARE   Urine-Other FEW YEAST      Imaging Studies: Dg Sacrum/coccyx  11/02/2012   *RADIOLOGY REPORT*  Clinical Data: Lower back pain.  SACRUM AND COCCYX - 2+ VIEW  Comparison: CT of the abdomen and pelvis performed 09/28/2012, and lumbar spine radiographs performed 09/05/2012  Findings: Evaluation is mildly suboptimal due to cassette artifact.  The sacrum appears intact.  There is no evidence of fracture or dislocation.  Coccygeal alignment is within normal limits.  The lower lumbar spine is unremarkable in appearance.  The visualized bowel gas  pattern is grossly unremarkable.  Both hip joints are within normal limits.  IMPRESSION: No evidence of fracture or dislocation.   Original  Report Authenticated By: Tonia Ghent, M.D.      Hanley Seamen, MD 11/02/12 [phone]

## 2012-11-01 NOTE — ED Notes (Signed)
MD with pt  

## 2012-11-01 NOTE — ED Notes (Signed)
Transported to xray 

## 2012-11-01 NOTE — ED Notes (Signed)
Wednesday night had acute onset lower back pain and buttock pain, became sweaty, pain has continued since then, used heating pads, ben gay with no results, states feels it's getting worse, difficulty walking

## 2012-11-02 IMAGING — CR DG SACRUM/COCCYX 2+V
3 series · 3 of 3 positions shown · non-contrast
Comparison: CT of the abdomen and pelvis performed [DATE], and
lumbar spine radiographs performed [DATE]

CLINICAL DATA: Lower back pain.

SACRUM AND COCCYX - 2+ VIEW

[t sacrum a.p.]
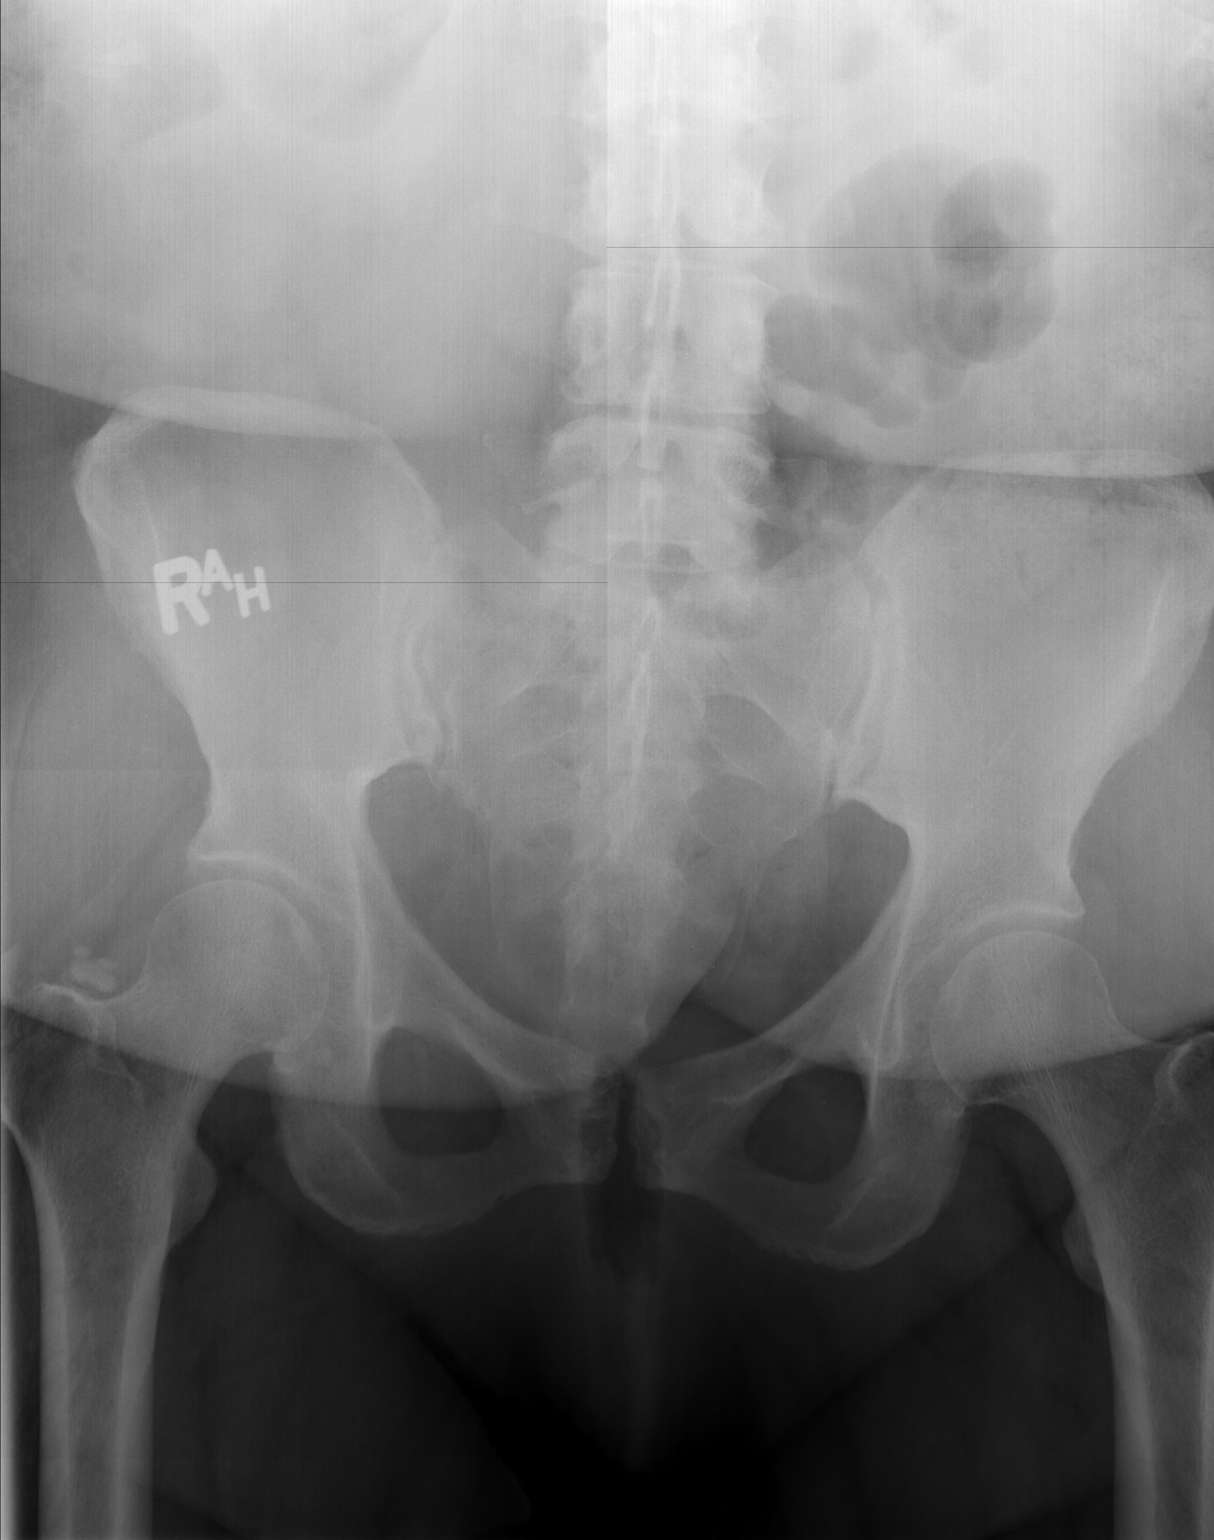

[t coccyx a.p.]
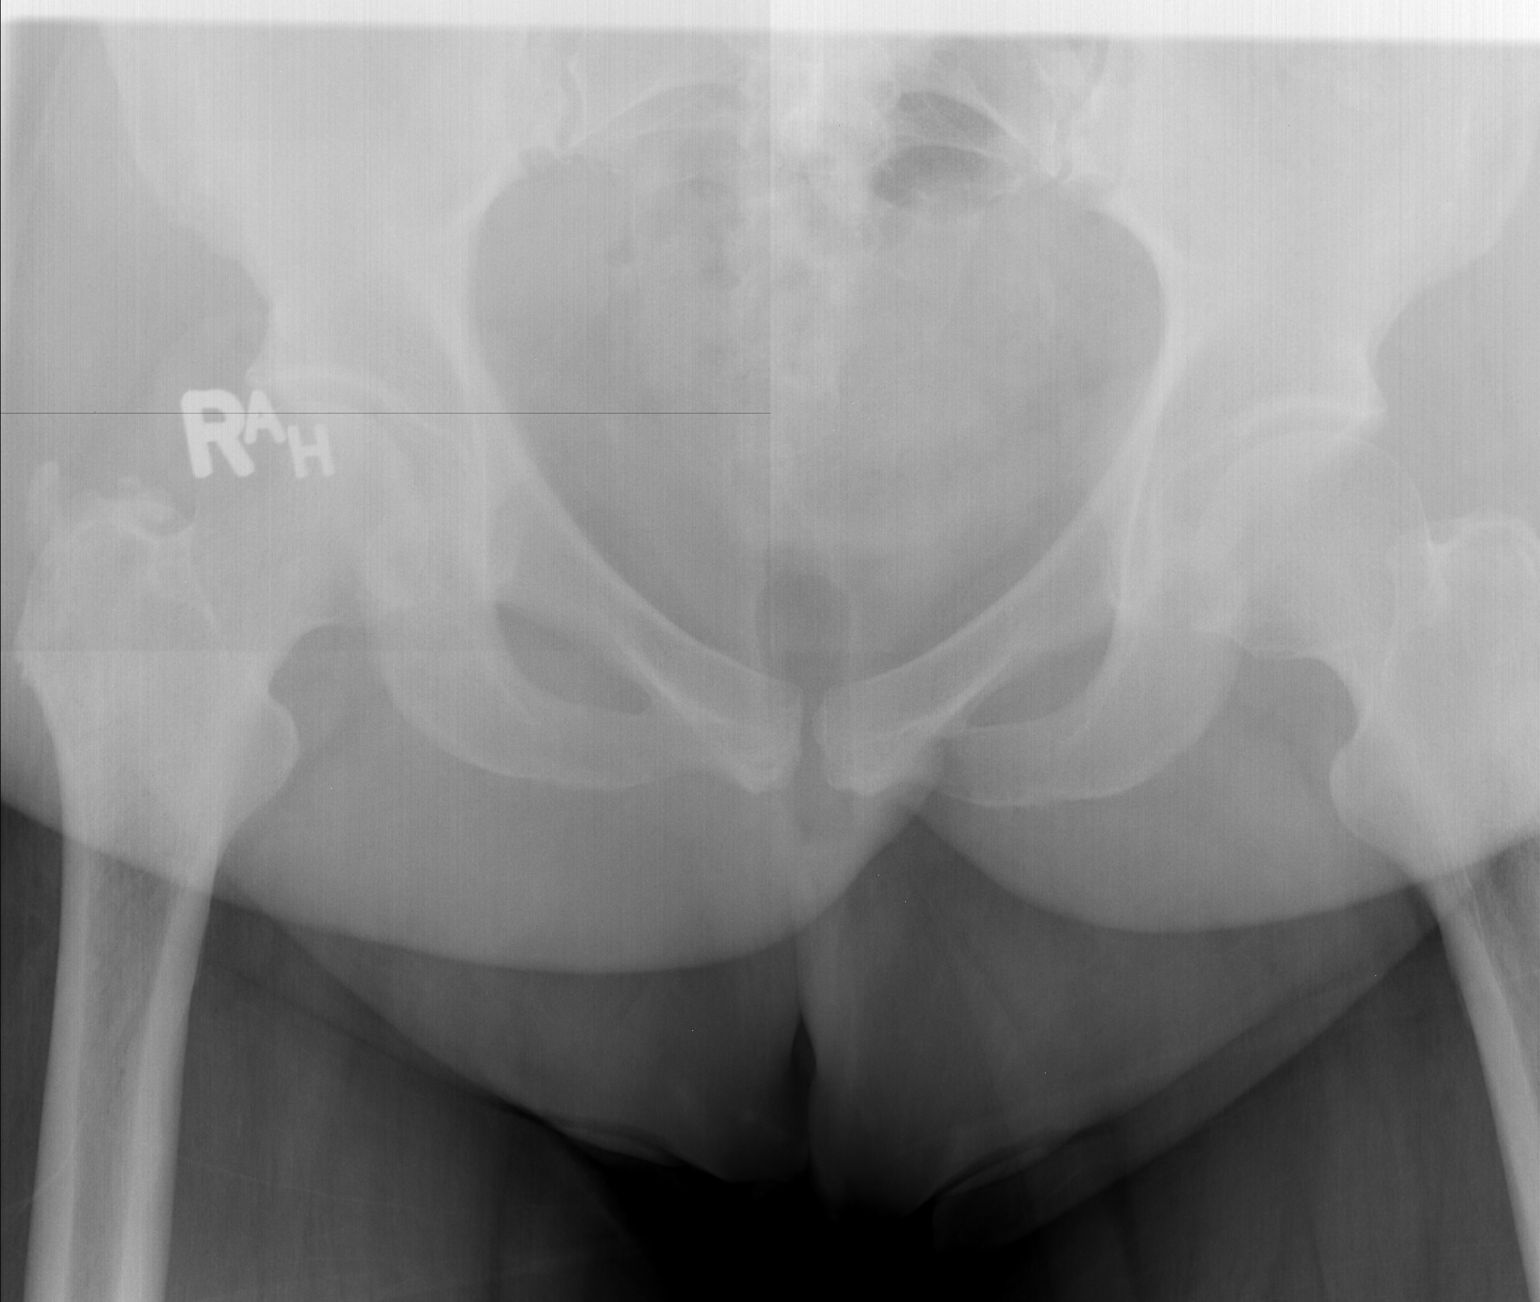

[t sacrum lat]
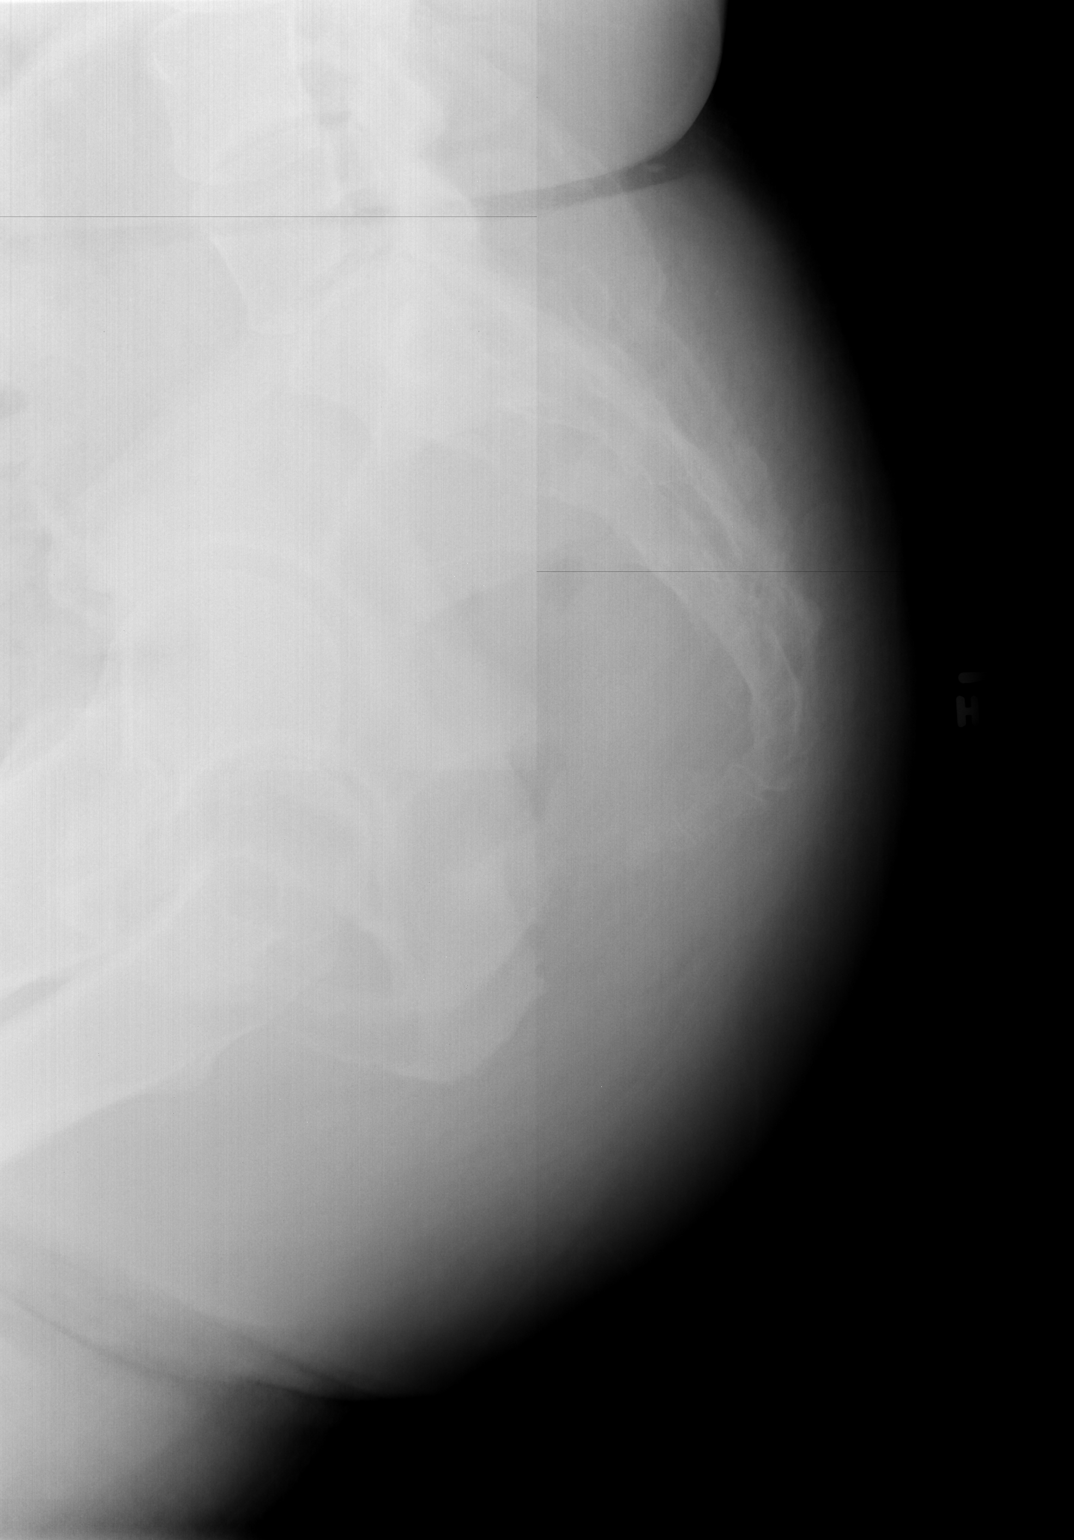

[3 of 3 positions shown; findings below may reference images not displayed]

FINDINGS: Evaluation is mildly suboptimal due to cassette artifact.

The sacrum appears intact.  There is no evidence of fracture or
dislocation.  Coccygeal alignment is within normal limits.  The
lower lumbar spine is unremarkable in appearance.

The visualized bowel gas pattern is grossly unremarkable.  Both hip
joints are within normal limits.
IMPRESSION: No evidence of fracture or dislocation.

## 2012-11-02 MED ORDER — HYDROCODONE-ACETAMINOPHEN 5-325 MG PO TABS: 1.0000 | ORAL_TABLET | Freq: Four times a day (QID) | ORAL | Status: DC | PRN

## 2012-11-23 ENCOUNTER — Emergency Department (HOSPITAL_BASED_OUTPATIENT_CLINIC_OR_DEPARTMENT_OTHER): Payer: Medicaid Other

## 2012-11-23 ENCOUNTER — Emergency Department (HOSPITAL_BASED_OUTPATIENT_CLINIC_OR_DEPARTMENT_OTHER)
Admission: EM | Admit: 2012-11-23 | Discharge: 2012-11-23 | Disposition: A | Discharge status: Home / Self Care | Payer: Medicaid Other | Attending: Emergency Medicine | Admitting: Emergency Medicine

## 2012-11-23 ENCOUNTER — Encounter (HOSPITAL_BASED_OUTPATIENT_CLINIC_OR_DEPARTMENT_OTHER): Payer: Self-pay | Admitting: *Deleted

## 2012-11-23 DIAGNOSIS — Z8739 Personal history of other diseases of the musculoskeletal system and connective tissue: Secondary | ICD-10-CM | POA: Insufficient documentation

## 2012-11-23 DIAGNOSIS — E669 Obesity, unspecified: Secondary | ICD-10-CM | POA: Insufficient documentation

## 2012-11-23 DIAGNOSIS — Z79899 Other long term (current) drug therapy: Secondary | ICD-10-CM | POA: Insufficient documentation

## 2012-11-23 DIAGNOSIS — Z794 Long term (current) use of insulin: Secondary | ICD-10-CM | POA: Insufficient documentation

## 2012-11-23 DIAGNOSIS — I10 Essential (primary) hypertension: Secondary | ICD-10-CM | POA: Insufficient documentation

## 2012-11-23 DIAGNOSIS — IMO0002 Reserved for concepts with insufficient information to code with codable children: Secondary | ICD-10-CM | POA: Insufficient documentation

## 2012-11-23 DIAGNOSIS — Z862 Personal history of diseases of the blood and blood-forming organs and certain disorders involving the immune mechanism: Secondary | ICD-10-CM | POA: Insufficient documentation

## 2012-11-23 DIAGNOSIS — E1142 Type 2 diabetes mellitus with diabetic polyneuropathy: Secondary | ICD-10-CM | POA: Insufficient documentation

## 2012-11-23 DIAGNOSIS — Z8639 Personal history of other endocrine, nutritional and metabolic disease: Secondary | ICD-10-CM | POA: Insufficient documentation

## 2012-11-23 DIAGNOSIS — E1149 Type 2 diabetes mellitus with other diabetic neurological complication: Secondary | ICD-10-CM | POA: Insufficient documentation

## 2012-11-23 DIAGNOSIS — M549 Dorsalgia, unspecified: Secondary | ICD-10-CM

## 2012-11-23 DIAGNOSIS — Z8709 Personal history of other diseases of the respiratory system: Secondary | ICD-10-CM | POA: Insufficient documentation

## 2012-11-23 DIAGNOSIS — S0990XA Unspecified injury of head, initial encounter: Secondary | ICD-10-CM | POA: Insufficient documentation
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
## 2012-11-23 MED ORDER — HYDROCODONE-ACETAMINOPHEN 5-325 MG PO TABS: 2.0000 | ORAL_TABLET | Freq: Four times a day (QID) | ORAL | Status: DC | PRN

## 2012-11-23 MED ORDER — HYDROCODONE-ACETAMINOPHEN 5-325 MG PO TABS
2.0000 | ORAL_TABLET | Freq: Once | ORAL | Status: AC
  Administered 2012-11-23: 2 via ORAL
  Filled 2012-11-23: qty 2
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
## 2013-08-14 ENCOUNTER — Emergency Department (HOSPITAL_BASED_OUTPATIENT_CLINIC_OR_DEPARTMENT_OTHER): Payer: Medicaid Other

## 2013-08-14 ENCOUNTER — Emergency Department (HOSPITAL_BASED_OUTPATIENT_CLINIC_OR_DEPARTMENT_OTHER)
Admission: EM | Admit: 2013-08-14 | Discharge: 2013-08-14 | Disposition: A | Discharge status: Home / Self Care | Payer: Medicaid Other | Attending: Emergency Medicine | Admitting: Emergency Medicine

## 2013-08-14 DIAGNOSIS — S9030XA Contusion of unspecified foot, initial encounter: Secondary | ICD-10-CM | POA: Insufficient documentation

## 2013-08-14 DIAGNOSIS — Y9302 Activity, running: Secondary | ICD-10-CM | POA: Insufficient documentation

## 2013-08-14 DIAGNOSIS — Z8709 Personal history of other diseases of the respiratory system: Secondary | ICD-10-CM | POA: Insufficient documentation

## 2013-08-14 DIAGNOSIS — Z79899 Other long term (current) drug therapy: Secondary | ICD-10-CM | POA: Insufficient documentation

## 2013-08-14 DIAGNOSIS — Y929 Unspecified place or not applicable: Secondary | ICD-10-CM | POA: Insufficient documentation

## 2013-08-14 DIAGNOSIS — E1142 Type 2 diabetes mellitus with diabetic polyneuropathy: Secondary | ICD-10-CM | POA: Insufficient documentation

## 2013-08-14 DIAGNOSIS — E11319 Type 2 diabetes mellitus with unspecified diabetic retinopathy without macular edema: Secondary | ICD-10-CM | POA: Insufficient documentation

## 2013-08-14 DIAGNOSIS — E1149 Type 2 diabetes mellitus with other diabetic neurological complication: Secondary | ICD-10-CM | POA: Insufficient documentation

## 2013-08-14 DIAGNOSIS — Z794 Long term (current) use of insulin: Secondary | ICD-10-CM | POA: Insufficient documentation

## 2013-08-14 DIAGNOSIS — M129 Arthropathy, unspecified: Secondary | ICD-10-CM | POA: Insufficient documentation

## 2013-08-14 DIAGNOSIS — W1809XA Striking against other object with subsequent fall, initial encounter: Secondary | ICD-10-CM | POA: Insufficient documentation

## 2013-08-14 DIAGNOSIS — I10 Essential (primary) hypertension: Secondary | ICD-10-CM | POA: Insufficient documentation

## 2013-08-14 DIAGNOSIS — E1139 Type 2 diabetes mellitus with other diabetic ophthalmic complication: Secondary | ICD-10-CM | POA: Insufficient documentation

## 2013-08-14 DIAGNOSIS — E669 Obesity, unspecified: Secondary | ICD-10-CM | POA: Insufficient documentation

## 2013-08-14 DIAGNOSIS — S9031XA Contusion of right foot, initial encounter: Secondary | ICD-10-CM

## 2013-08-14 IMAGING — CR DG FOOT COMPLETE 3+V*R*
3 series · 3 of 3 positions shown · non-contrast
Comparison: None.

CLINICAL DATA: Right foot injury.  Pain.

EXAM:
RIGHT FOOT COMPLETE - 3+ VIEW

[t foot ap right]
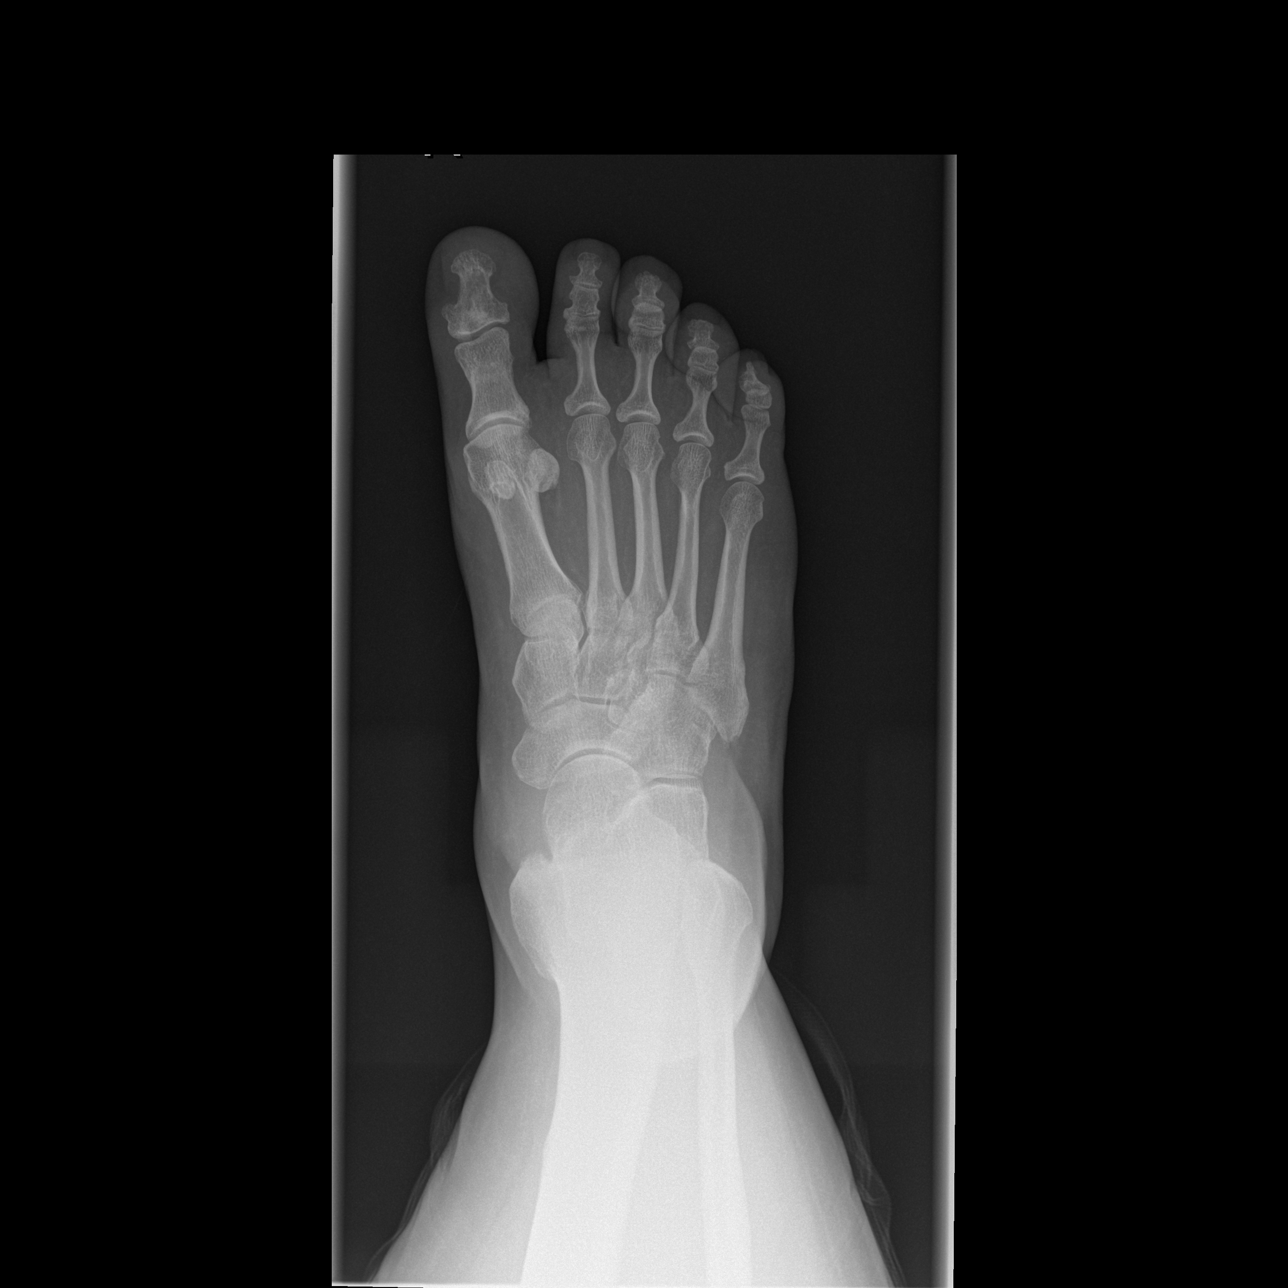

[t foot oblique right]
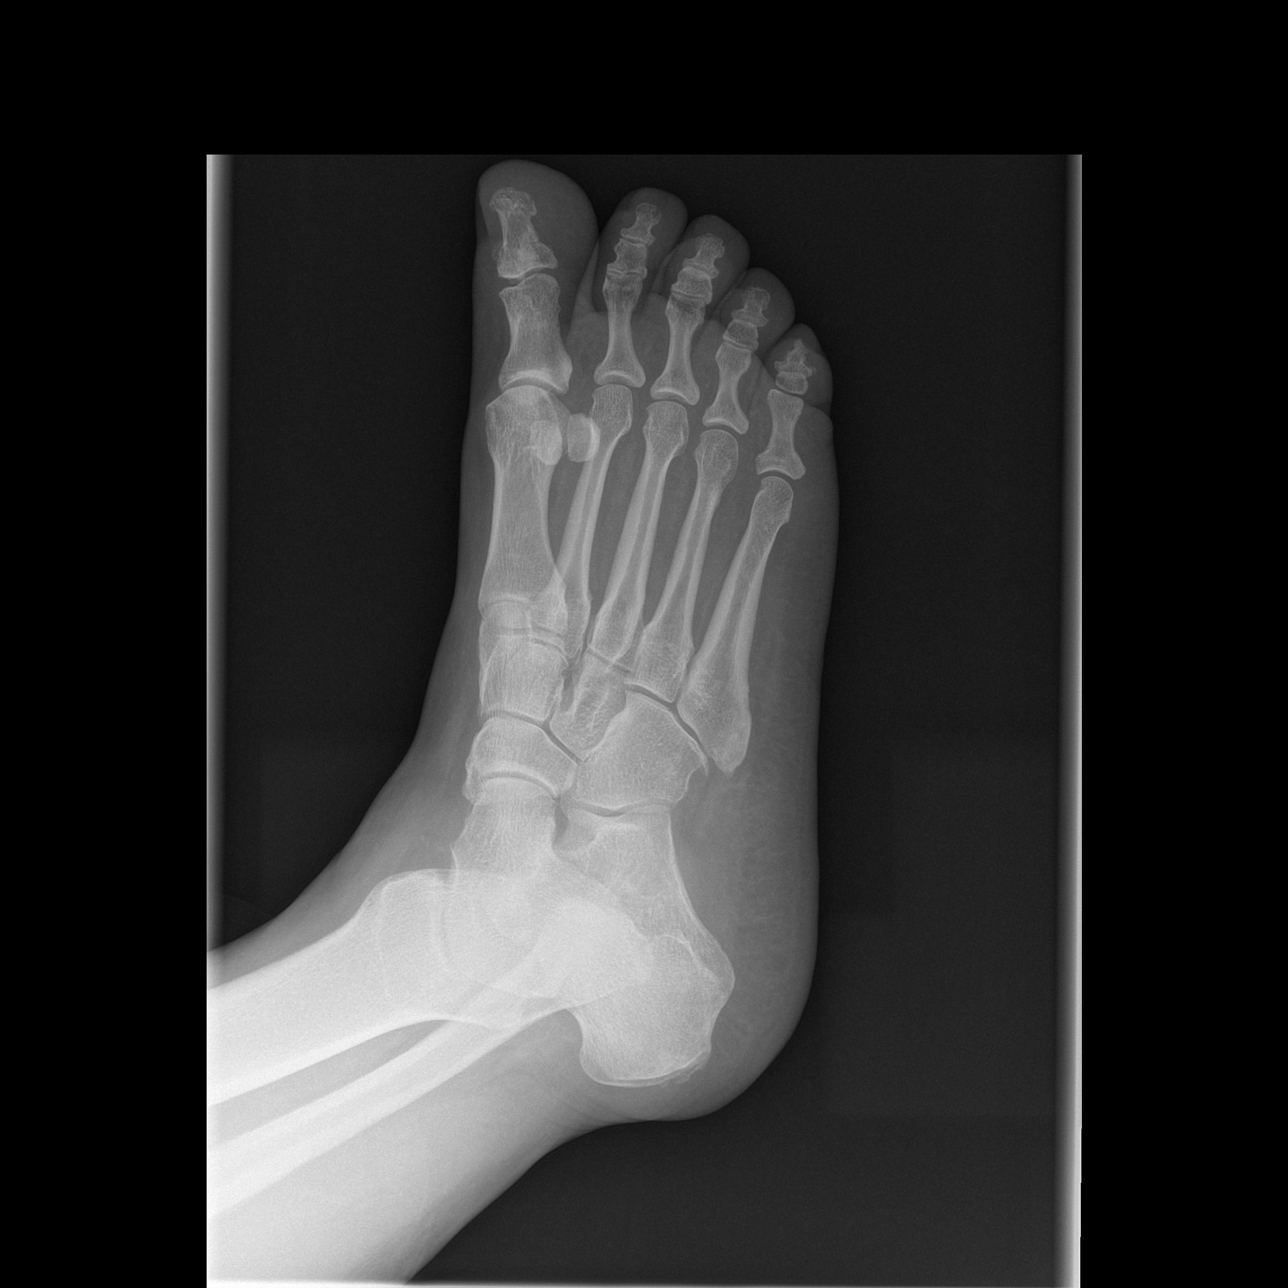

[t foot lat right]
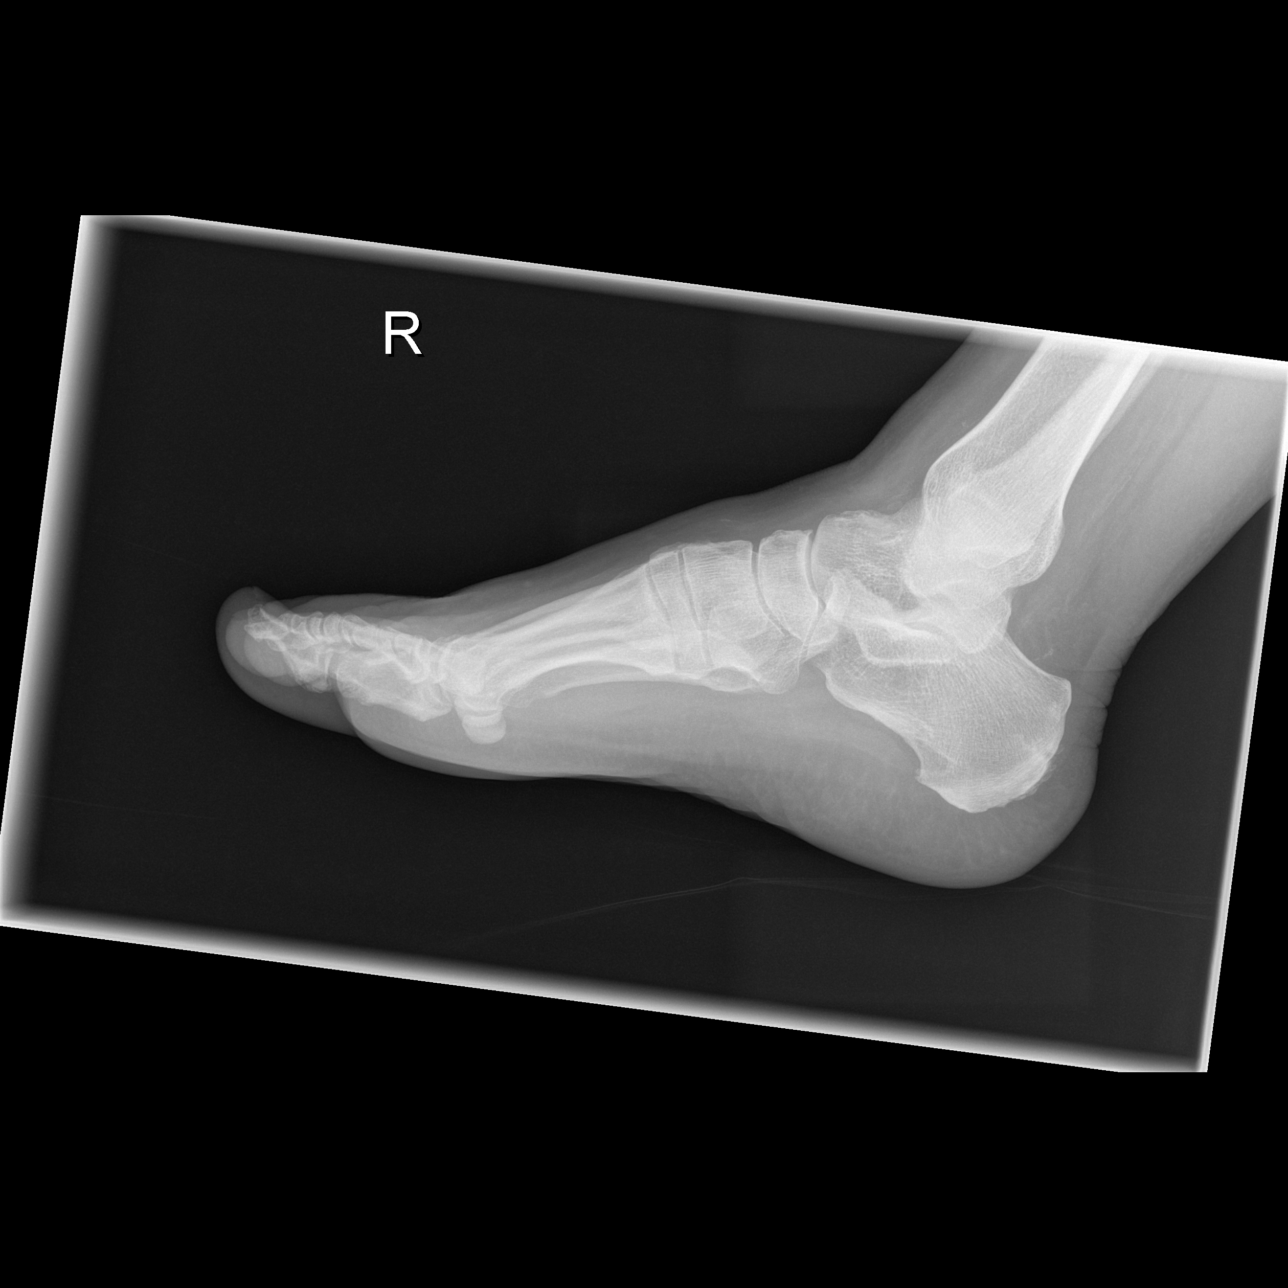

[3 of 3 positions shown; findings below may reference images not displayed]

FINDINGS: No fracture. No dislocation. Minor first metatarsophalangeal joint
osteoarthritis. Minimal plantar calcaneal spur. Soft tissues are
unremarkable.
IMPRESSION: No fracture or acute finding.

## 2013-08-14 MED ORDER — HYDROCODONE-ACETAMINOPHEN 5-325 MG PO TABS
1.0000 | ORAL_TABLET | Freq: Once | ORAL | Status: AC
  Administered 2013-08-14: 1 via ORAL
  Filled 2013-08-14: qty 1

## 2013-08-14 MED ORDER — IBUPROFEN 800 MG PO TABS: 800.0000 mg | ORAL_TABLET | Freq: Three times a day (TID) | ORAL | Status: DC

## 2013-08-14 MED ORDER — HYDROCODONE-ACETAMINOPHEN 5-325 MG PO TABS
1.0000 | ORAL_TABLET | ORAL | Status: DC | PRN
Start: 2013-08-14 — End: 2014-06-23

## 2013-08-14 NOTE — ED Provider Notes (Signed)
Medical screening examination/treatment/procedure(s) were performed by non-physician practitioner and as supervising physician I was immediately available for consultation/collaboration.   EKG Interpretation None        Rolan Bucco, MD 08/14/13 2128

## 2013-08-14 NOTE — ED Provider Notes (Signed)
CSN: [id number]     Arrival date & time 08/14/13  2010 History   First MD Initiated Contact with Patient 08/14/13 2033     Chief Complaint  Patient presents with  . Foot Injury     (Consider location/radiation/quality/duration/timing/severity/associated sxs/prior Treatment) Patient is a 58 y.o. female presenting with foot injury. The history is provided by the patient. No language interpreter was used.  Foot Injury Location:  Foot Injury: yes   Foot location:  R foot Chronicity:  New Dislocation: no   Foreign body present:  No foreign bodies Associated symptoms comment:  She hit her foot on the frame of a door while running earlier today. She complains of pain at the 2nd toe with swelling.    Past Medical History  Diagnosis Date  . Bronchitis   . Diabetic neuropathy   . Diabetic retinopathy   . Arthritis   . Hypertension   . Hyperlipemia   . Obesity   . Diabetes mellitus    Past Surgical History  Procedure Laterality Date  . Cesarean section      x3  . Cholecystectomy    . Eye surgery Left    No family history on file. History  Substance Use Topics  . Smoking status: Never Smoker   . Smokeless tobacco: Never Used  . Alcohol Use: No   OB History   Grav Para Term Preterm Abortions TAB SAB Ect Mult Living                 Review of Systems  Musculoskeletal:       See HPI.  Skin: Negative for color change and wound.      Allergies  Morphine and related; Ciprofloxacin; and Soma  Home Medications   Prior to Admission medications   Medication Sig Start Date End Date Taking? Authorizing Provider  Amphetamine-Dextroamphetamine (ADDERALL PO) Take by mouth.    Historical Provider, MD  atenolol (TENORMIN) 25 MG tablet Take 25 mg by mouth daily.    Historical Provider, MD  Cyclobenzaprine HCl (FLEXERIL PO) Take by mouth.    Historical Provider, MD  esomeprazole (NEXIUM) 20 MG capsule Take 20 mg by mouth daily before breakfast.    Historical Provider, MD   FLUoxetine (PROZAC) 20 MG capsule Take 20 mg by mouth daily.    Historical Provider, MD  gabapentin (NEURONTIN) 300 MG capsule Take 300 mg by mouth 3 (three) times daily.    Historical Provider, MD  glyBURIDE (DIABETA) 1.25 MG tablet Take 1.25 mg by mouth daily with breakfast.    Historical Provider, MD  HYDROCHLOROTHIAZIDE PO Take by mouth.    Historical Provider, MD  HYDROcodone-acetaminophen (NORCO) 5-325 MG per tablet Take 1-2 tablets by mouth every 6 (six) hours as needed for pain. 11/02/12   Carlisle BeersJohn L Molpus, MD  HYDROcodone-acetaminophen (NORCO/VICODIN) 5-325 MG per tablet Take 2 tablets by mouth every 6 (six) hours as needed for pain. 11/23/12   Kristen N Ward, DO  insulin glargine (LANTUS) 100 UNIT/ML injection Inject 80 Units into the skin at bedtime.     Historical Provider, MD  insulin regular (NOVOLIN R,HUMULIN R) 100 units/mL injection Inject into the skin 3 (three) times daily before meals.    Historical Provider, MD  MELOXICAM PO Take by mouth.    Historical Provider, MD  metFORMIN (GLUMETZA) 1000 MG (MOD) 24 hr tablet Take 1,000 mg by mouth 3 (three) times daily.    Historical Provider, MD  methocarbamol (ROBAXIN) 750 MG tablet Take 375 mg by mouth  as needed.    Historical Provider, MD  Omeprazole (PRILOSEC PO) Take by mouth.    Historical Provider, MD  ondansetron (ZOFRAN ODT) 8 MG disintegrating tablet 8mg  ODT q4 hours prn nausea 09/28/12   Geoffery Lyons, MD  Potassium Chloride Crys CR (K-DUR PO) Take by mouth.    Historical Provider, MD  TRAMADOL HCL PO Take by mouth.    Historical Provider, MD   BP 148/80  Pulse 92  Temp(Src) 98.8 F (37.1 C) (Oral)  Resp 18  Ht 5\' 2"  (1.575 m)  Wt 200 lb (90.719 kg)  BMI 36.57 kg/m2  SpO2 99% Physical Exam  Constitutional: She appears well-developed and well-nourished. No distress.  Musculoskeletal:  Right foot with mild swelling distal forefoot over 2nd MTP. No bony deformity. Pain with active/passive movement of 2nd toe.     ED Course   Procedures (including critical care time) Labs Review Labs Reviewed - No data to display  Imaging Review Dg Foot Complete Right  08/14/2013   CLINICAL DATA:  Right foot injury.  Pain.  EXAM: RIGHT FOOT COMPLETE - 3+ VIEW  COMPARISON:  None.  FINDINGS: No fracture. No dislocation. Minor first metatarsophalangeal joint osteoarthritis. Minimal plantar calcaneal spur. Soft tissues are unremarkable.  IMPRESSION: No fracture or acute finding.   Electronically Signed   By: Amie Portland M.D.   On: 08/14/2013 21:10     EKG Interpretation None      MDM   Final diagnoses:  None    1. Contusion right foot.  Uncomplicated, non-fracture contusion injury to right foot.     Arnoldo Hooker, PA-C 08/14/13 2125

## 2013-08-14 NOTE — ED Notes (Signed)
Pt states she hit her foot on the corner of the wall earlier today. C/o of right foot pain, swelling, and bruising.

## 2013-08-14 NOTE — Discharge Instructions (Signed)
Foot Contusion °A foot contusion is a deep bruise to the foot. Contusions are the result of an injury that caused bleeding under the skin. The contusion may turn blue, purple, or yellow. Minor injuries will give you a painless contusion, but more severe contusions may stay painful and swollen for a few weeks. °CAUSES  °A foot contusion comes from a direct blow to that area, such as a heavy object falling on the foot. °SYMPTOMS  °· Swelling of the foot. °· Discoloration of the foot. °· Tenderness or soreness of the foot. °DIAGNOSIS  °You will have a physical exam and will be asked about your history. You may need an X-ray of your foot to look for a broken bone (fracture).  °TREATMENT  °An elastic wrap may be recommended to support your foot. Resting, elevating, and applying cold compresses to your foot are often the best treatments for a foot contusion. Over-the-counter medicines may also be recommended for pain control. °HOME CARE INSTRUCTIONS  °· Put ice on the injured area. °· Put ice in a plastic bag. °· Place a towel between your skin and the bag. °· Leave the ice on for 15-20 minutes, 03-04 times a day. °· Only take over-the-counter or prescription medicines for pain, discomfort, or fever as directed by your caregiver. °· If told, use an elastic wrap as directed. This can help reduce swelling. You may remove the wrap for sleeping, showering, and bathing. If your toes become numb, cold, or blue, take the wrap off and reapply it more loosely. °· Elevate your foot with pillows to reduce swelling. °· Try to avoid standing or walking while the foot is painful. Do not resume use until instructed by your caregiver. Then, begin use gradually. If pain develops, decrease use. Gradually increase activities that do not cause discomfort until you have normal use of your foot. °· See your caregiver as directed. It is very important to keep all follow-up appointments in order to avoid any lasting problems with your foot,  including long-term (chronic) pain. °SEEK IMMEDIATE MEDICAL CARE IF:  °· You have increased redness, swelling, or pain in your foot. °· Your swelling or pain is not relieved with medicines. °· You have loss of feeling in your foot or are unable to move your toes. °· Your foot turns cold or blue. °· You have pain when you move your toes. °· Your foot becomes warm to the touch. °· Your contusion does not improve in 2 days. °MAKE SURE YOU:  °· Understand these instructions. °· Will watch your condition. °· Will get help right away if you are not doing well or get worse. °Document Released: 01/01/2006 Document Revised: 09/11/2011 Document Reviewed: 02/13/2011 °[employer]® Patient Information ©2014 [employer]. ° °Cryotherapy °Cryotherapy means treatment with cold. Ice or gel packs can be used to reduce both pain and swelling. Ice is the most helpful within the first 24 to 48 hours after an injury or flareup from overusing a muscle or joint. Sprains, strains, spasms, burning pain, shooting pain, and aches can all be eased with ice. Ice can also be used when recovering from surgery. Ice is effective, has very few side effects, and is safe for most people to use. °PRECAUTIONS  °Ice is not a safe treatment option for people with: °· Raynaud's phenomenon. This is a condition affecting small blood vessels in the extremities. Exposure to cold may cause your problems to return. °· Cold hypersensitivity. There are many forms of cold hypersensitivity, including: °· Cold urticaria. Red,   itchy hives appear on the skin when the tissues begin to warm after being iced. °· Cold erythema. This is a red, itchy rash caused by exposure to cold. °· Cold hemoglobinuria. Red blood cells break down when the tissues begin to warm after being iced. The hemoglobin that carry oxygen are passed into the urine because they cannot combine with blood proteins fast enough. °· Numbness or altered sensitivity in the area being iced. °If you have any of  the following conditions, do not use ice until you have discussed cryotherapy with your caregiver: °· Heart conditions, such as arrhythmia, angina, or chronic heart disease. °· High blood pressure. °· Healing wounds or open skin in the area being iced. °· Current infections. °· Rheumatoid arthritis. °· Poor circulation. °· Diabetes. °Ice slows the blood flow in the region it is applied. This is beneficial when trying to stop inflamed tissues from spreading irritating chemicals to surrounding tissues. However, if you expose your skin to cold temperatures for too long or without the proper protection, you can damage your skin or nerves. Watch for signs of skin damage due to cold. °HOME CARE INSTRUCTIONS °Follow these tips to use ice and cold packs safely. °· Place a dry or damp towel between the ice and skin. A damp towel will cool the skin more quickly, so you may need to shorten the time that the ice is used. °· For a more rapid response, add gentle compression to the ice. °· Ice for no more than 10 to 20 minutes at a time. The bonier the area you are icing, the less time it will take to get the benefits of ice. °· Check your skin after 5 minutes to make sure there are no signs of a poor response to cold or skin damage. °· Rest 20 minutes or more in between uses. °· Once your skin is numb, you can end your treatment. You can test numbness by very lightly touching your skin. The touch should be so light that you do not see the skin dimple from the pressure of your fingertip. When using ice, most people will feel these normal sensations in this order: cold, burning, aching, and numbness. °· Do not use ice on someone who cannot communicate their responses to pain, such as small children or people with dementia. °HOW TO MAKE AN ICE PACK °Ice packs are the most common way to use ice therapy. Other methods include ice massage, ice baths, and cryo-sprays. Muscle creams that cause a cold, tingly feeling do not offer the  same benefits that ice offers and should not be used as a substitute unless recommended by your caregiver. °To make an ice pack, do one of the following: °· Place crushed ice or a bag of frozen vegetables in a sealable plastic bag. Squeeze out the excess air. Place this bag inside another plastic bag. Slide the bag into a pillowcase or place a damp towel between your skin and the bag. °· Mix 3 parts water with 1 part rubbing alcohol. Freeze the mixture in a sealable plastic bag. When you remove the mixture from the freezer, it will be slushy. Squeeze out the excess air. Place this bag inside another plastic bag. Slide the bag into a pillowcase or place a damp towel between your skin and the bag. °SEEK MEDICAL CARE IF: °· You develop white spots on your skin. This may give the skin a blotchy (mottled) appearance. °· Your skin turns blue or pale. °· Your skin becomes waxy   or hard. °· Your swelling gets worse. °MAKE SURE YOU:  °· Understand these instructions. °· Will watch your condition. °· Will get help right away if you are not doing well or get worse. °Document Released: 11/06/2010 Document Revised: 06/04/2011 Document Reviewed: 11/06/2010 °[employer]® Patient Information ©2014 [employer]. ° °

## 2013-08-14 NOTE — ED Notes (Signed)
Patient transported to X-ray 

## 2014-06-23 ENCOUNTER — Encounter (HOSPITAL_BASED_OUTPATIENT_CLINIC_OR_DEPARTMENT_OTHER): Payer: Self-pay

## 2014-06-23 ENCOUNTER — Inpatient Hospital Stay (HOSPITAL_BASED_OUTPATIENT_CLINIC_OR_DEPARTMENT_OTHER)
Admission: EM | Admit: 2014-06-23 | Discharge: 2014-06-25 | DRG: 690 | Disposition: A | Discharge status: Home / Self Care | Payer: Medicaid Other | Attending: Internal Medicine | Admitting: Internal Medicine

## 2014-06-23 ENCOUNTER — Emergency Department (HOSPITAL_BASED_OUTPATIENT_CLINIC_OR_DEPARTMENT_OTHER): Payer: Medicaid Other

## 2014-06-23 DIAGNOSIS — Z794 Long term (current) use of insulin: Secondary | ICD-10-CM | POA: Diagnosis not present

## 2014-06-23 DIAGNOSIS — E1165 Type 2 diabetes mellitus with hyperglycemia: Secondary | ICD-10-CM | POA: Diagnosis present

## 2014-06-23 DIAGNOSIS — E119 Type 2 diabetes mellitus without complications: Secondary | ICD-10-CM

## 2014-06-23 DIAGNOSIS — M549 Dorsalgia, unspecified: Secondary | ICD-10-CM | POA: Diagnosis present

## 2014-06-23 DIAGNOSIS — G43909 Migraine, unspecified, not intractable, without status migrainosus: Secondary | ICD-10-CM | POA: Diagnosis present

## 2014-06-23 DIAGNOSIS — E785 Hyperlipidemia, unspecified: Secondary | ICD-10-CM | POA: Diagnosis present

## 2014-06-23 DIAGNOSIS — E11319 Type 2 diabetes mellitus with unspecified diabetic retinopathy without macular edema: Secondary | ICD-10-CM | POA: Diagnosis present

## 2014-06-23 DIAGNOSIS — Z6838 Body mass index (BMI) 38.0-38.9, adult: Secondary | ICD-10-CM | POA: Diagnosis not present

## 2014-06-23 DIAGNOSIS — N10 Acute tubulo-interstitial nephritis: Secondary | ICD-10-CM | POA: Diagnosis not present

## 2014-06-23 DIAGNOSIS — J449 Chronic obstructive pulmonary disease, unspecified: Secondary | ICD-10-CM | POA: Diagnosis present

## 2014-06-23 DIAGNOSIS — G8929 Other chronic pain: Secondary | ICD-10-CM | POA: Diagnosis present

## 2014-06-23 DIAGNOSIS — Z79899 Other long term (current) drug therapy: Secondary | ICD-10-CM

## 2014-06-23 DIAGNOSIS — K219 Gastro-esophageal reflux disease without esophagitis: Secondary | ICD-10-CM | POA: Diagnosis present

## 2014-06-23 DIAGNOSIS — I10 Essential (primary) hypertension: Secondary | ICD-10-CM | POA: Diagnosis present

## 2014-06-23 DIAGNOSIS — J45909 Unspecified asthma, uncomplicated: Secondary | ICD-10-CM | POA: Diagnosis present

## 2014-06-23 DIAGNOSIS — R1031 Right lower quadrant pain: Secondary | ICD-10-CM | POA: Diagnosis not present

## 2014-06-23 DIAGNOSIS — E1142 Type 2 diabetes mellitus with diabetic polyneuropathy: Secondary | ICD-10-CM | POA: Diagnosis present

## 2014-06-23 DIAGNOSIS — E669 Obesity, unspecified: Secondary | ICD-10-CM | POA: Diagnosis present

## 2014-06-23 DIAGNOSIS — E1140 Type 2 diabetes mellitus with diabetic neuropathy, unspecified: Secondary | ICD-10-CM | POA: Diagnosis present

## 2014-06-23 DIAGNOSIS — F419 Anxiety disorder, unspecified: Secondary | ICD-10-CM | POA: Diagnosis present

## 2014-06-23 HISTORY — DX: Bursopathy, unspecified: M71.9

## 2014-06-23 HISTORY — DX: Migraine, unspecified, not intractable, without status migrainosus: G43.909

## 2014-06-23 HISTORY — DX: Dorsalgia, unspecified: M54.9

## 2014-06-23 HISTORY — DX: Urinary tract infection, site not specified: N39.0

## 2014-06-23 HISTORY — DX: Unspecified asthma, uncomplicated: J45.909

## 2014-06-23 HISTORY — DX: Sepsis, unspecified organism: A41.9

## 2014-06-23 LAB — COMPREHENSIVE METABOLIC PANEL
ALBUMIN: 3.8 g/dL (ref 3.5–5.2)
ALT: 18 U/L (ref 0–35)
AST: 17 U/L (ref 0–37)
Alkaline Phosphatase: 70 U/L (ref 39–117)
Anion gap: 10 (ref 5–15)
BUN: 13 mg/dL (ref 6–23)
CALCIUM: 9.3 mg/dL (ref 8.4–10.5)
CHLORIDE: 99 mmol/L (ref 96–112)
CO2: 26 mmol/L (ref 19–32)
Creatinine, Ser: 0.74 mg/dL (ref 0.50–1.10)
GFR calc Af Amer: >90 mL/min (ref >90)
GFR calc non Af Amer: >90 mL/min (ref >90)
Glucose, Bld: 268 mg/dL — ABNORMAL HIGH (ref 70–99)
POTASSIUM: 3.4 mmol/L — AB (ref 3.5–5.1)
SODIUM: 135 mmol/L (ref 135–145)
TOTAL PROTEIN: 8 g/dL (ref 6.0–8.3)
Total Bilirubin: 0.1 mg/dL — ABNORMAL LOW (ref 0.3–1.2)

## 2014-06-23 LAB — CBC WITH DIFFERENTIAL/PLATELET
BASOS PCT: 0 % (ref 0–1)
Basophils Absolute: 0 10*3/uL (ref 0.0–0.1)
EOS ABS: 0.1 10*3/uL (ref 0.0–0.7)
EOS PCT: 1 % (ref 0–5)
HCT: 41.9 % (ref 36.0–46.0)
HEMOGLOBIN: 13.7 g/dL (ref 12.0–15.0)
Lymphocytes Relative: 28 % (ref 12–46)
Lymphs Abs: 3.7 10*3/uL (ref 0.7–4.0)
MCH: 28 pg (ref 26.0–34.0)
MCHC: 32.7 g/dL (ref 30.0–36.0)
MCV: 85.5 fL (ref 78.0–100.0)
MONO ABS: 1 10*3/uL (ref 0.1–1.0)
MONOS PCT: 8 % (ref 3–12)
NEUTROS PCT: 63 % (ref 43–77)
Neutro Abs: 8.5 10*3/uL — ABNORMAL HIGH (ref 1.7–7.7)
Platelets: 359 10*3/uL (ref 150–400)
RBC: 4.9 MIL/uL (ref 3.87–5.11)
RDW: 13.1 % (ref 11.5–15.5)
WBC: 13.4 10*3/uL — AB (ref 4.0–10.5)

## 2014-06-23 LAB — CBG MONITORING, ED: Glucose-Capillary: 204 mg/dL — ABNORMAL HIGH (ref 70–99)

## 2014-06-23 LAB — URINALYSIS, ROUTINE W REFLEX MICROSCOPIC
Bilirubin Urine: NEGATIVE
Glucose, UA: >1000 mg/dL — AB
KETONES UR: NEGATIVE mg/dL
NITRITE: NEGATIVE
Protein, ur: NEGATIVE mg/dL
Specific Gravity, Urine: 1.015 (ref 1.005–1.030)
UROBILINOGEN UA: 1 mg/dL (ref 0.0–1.0)
pH: 6.5 (ref 5.0–8.0)

## 2014-06-23 LAB — URINE MICROSCOPIC-ADD ON

## 2014-06-23 LAB — LIPASE, BLOOD: Lipase: 20 U/L (ref 11–59)

## 2014-06-23 LAB — I-STAT CG4 LACTIC ACID, ED: Lactic Acid, Venous: 1.17 mmol/L (ref 0.5–2.0)

## 2014-06-23 IMAGING — CT CT ABD-PELV W/ CM
2 of 5 series · 16 of 46 positions shown, 18 images · IV contrast (APPLIED)
Comparison: CT [DATE]

CLINICAL DATA: Right lower quadrant abdominal pain for 3 days,
dysuria, nausea and vomiting today.

EXAM:
CT ABDOMEN AND PELVIS WITH CONTRAST
TECHNIQUE: Multidetector CT imaging of the abdomen and pelvis was performed
using the standard protocol following bolus administration of
intravenous contrast.
CONTRAST:  100 mL Omnipaque 300 IV

[Series 2: abd/pelvis 5.0 b31f · axial · 0.81mm/px · z∈[-541,-151]mm · 13 of 88 slices shown, 15 images]
[im 5/88  soft-tissue]
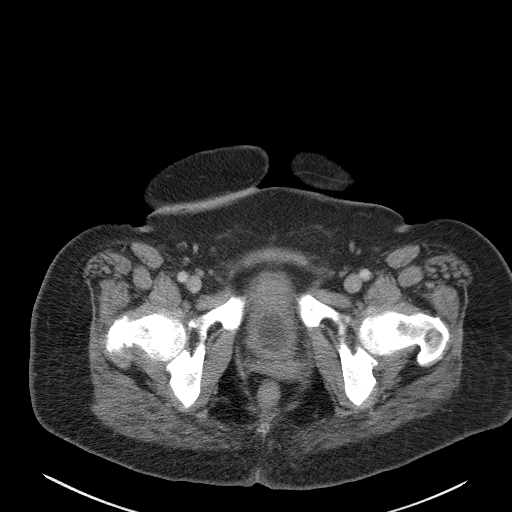
[im 5/88  bone]
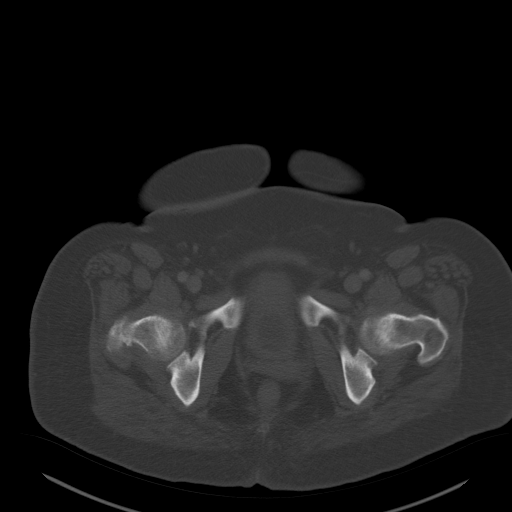
[im 10/88  soft-tissue]
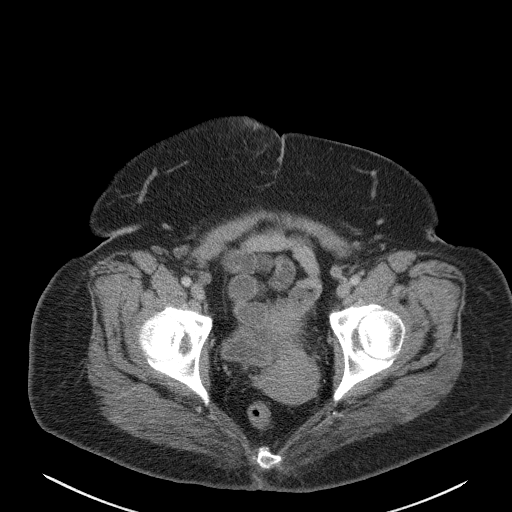
[im 20/88  soft-tissue]
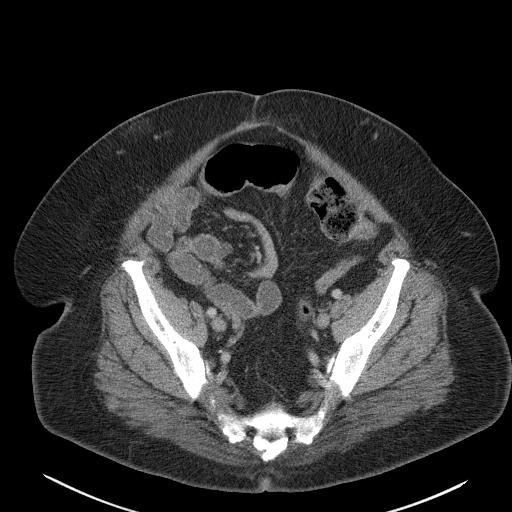
[im 25/88  soft-tissue]
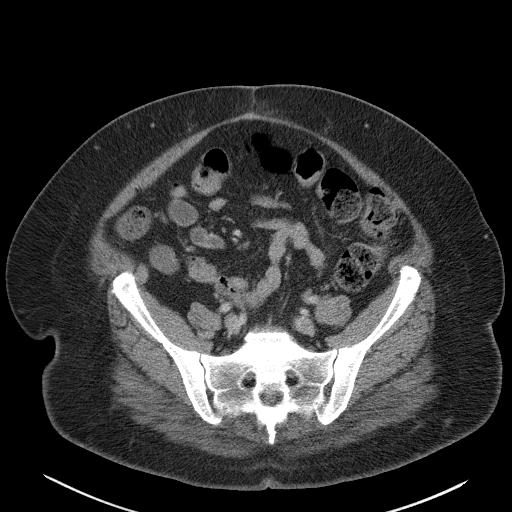
[im 30/88  soft-tissue]
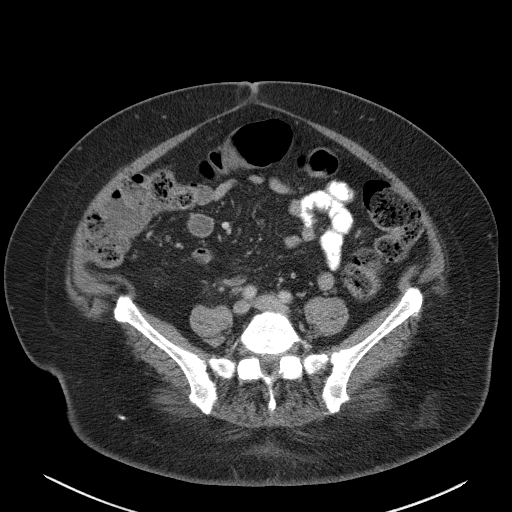
[im 39/88  soft-tissue]
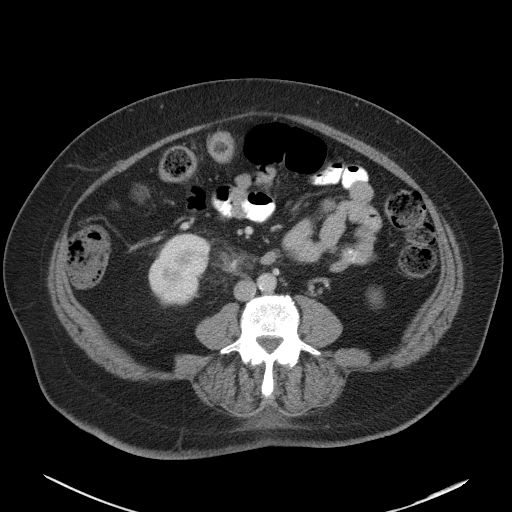
[im 44/88  soft-tissue]
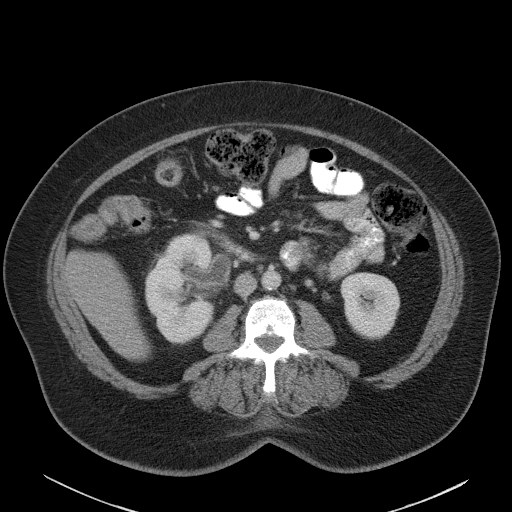
[im 49/88  soft-tissue]
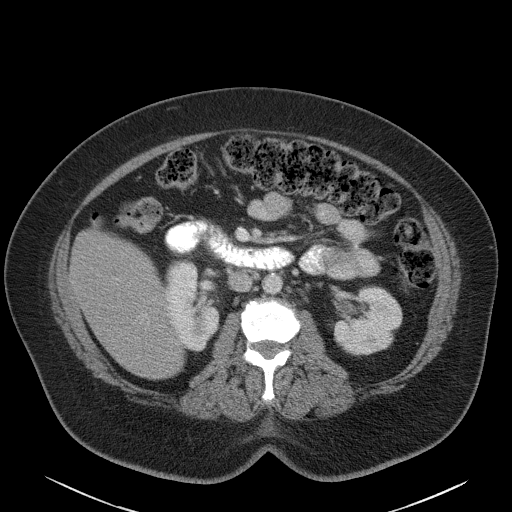
[im 59/88  soft-tissue]
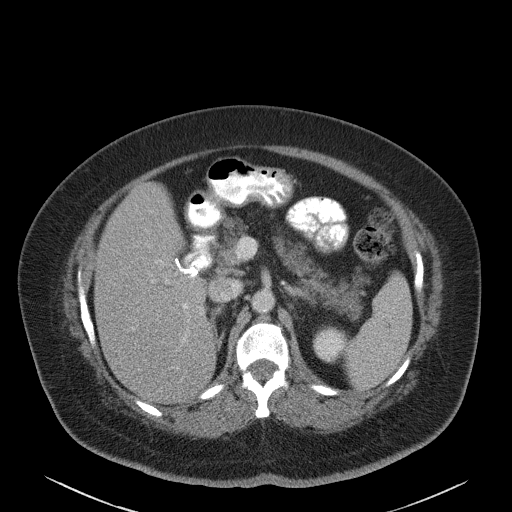
[im 59/88  bone]
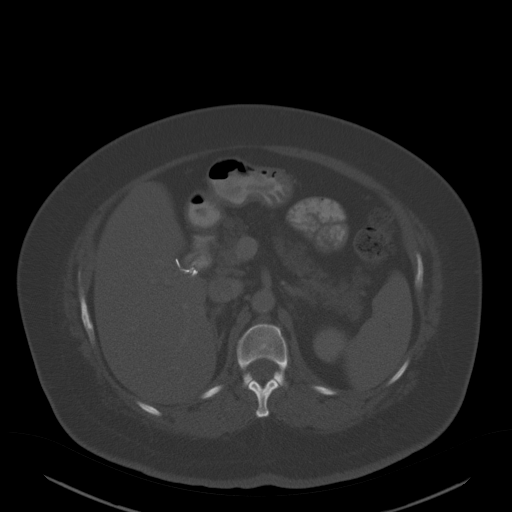
[im 63/88  soft-tissue]
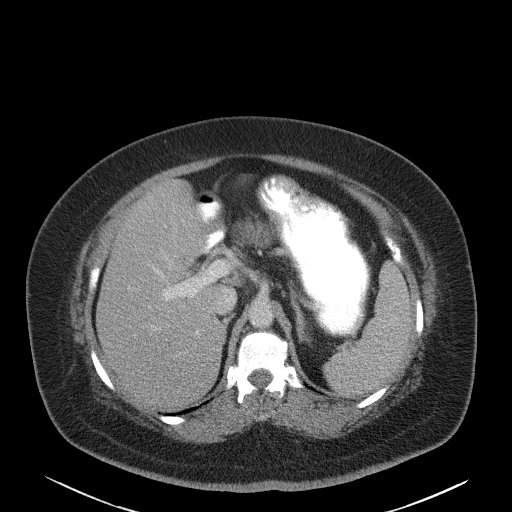
[im 68/88  soft-tissue]
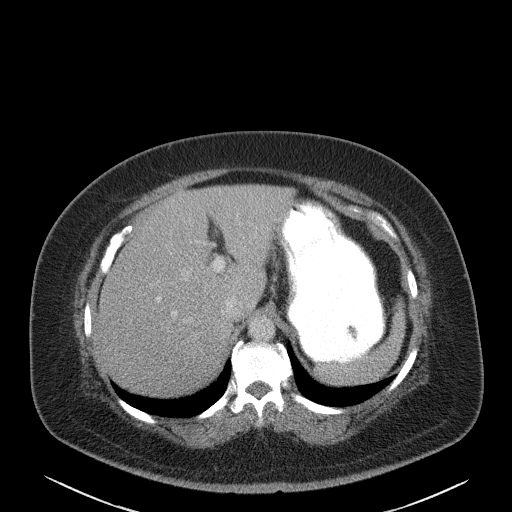
[im 78/88  soft-tissue]
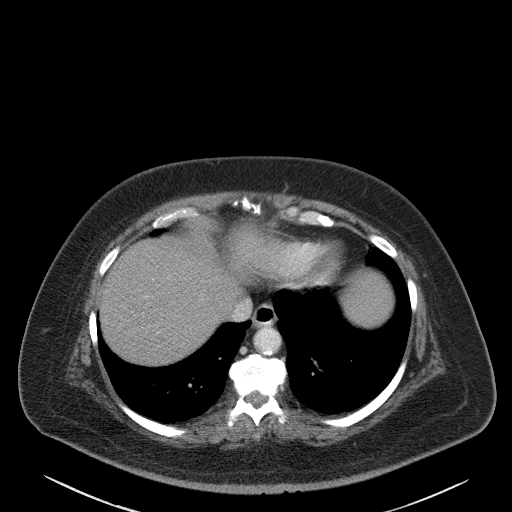
[im 83/88  soft-tissue]
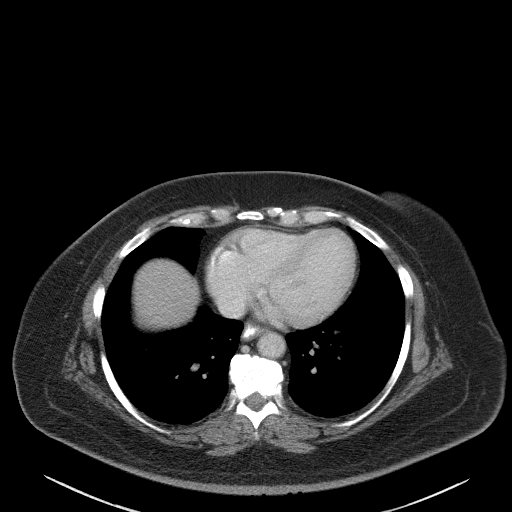

[Series 5: abd/pelvis 3.0 coronal · coronal · 0.85mm/px · 3 of 100 slices shown]
[im 34/100  soft-tissue]
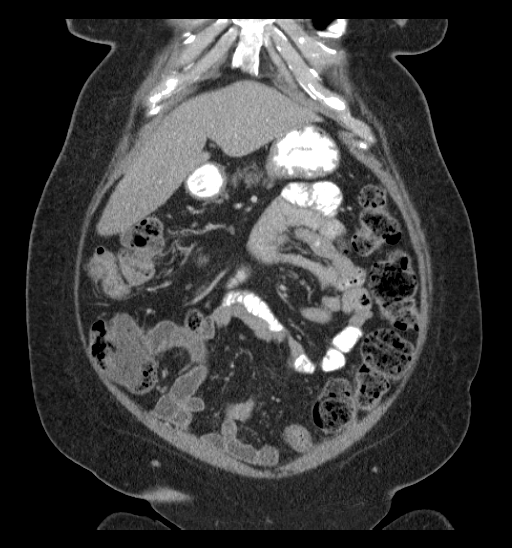
[im 45/100  soft-tissue]
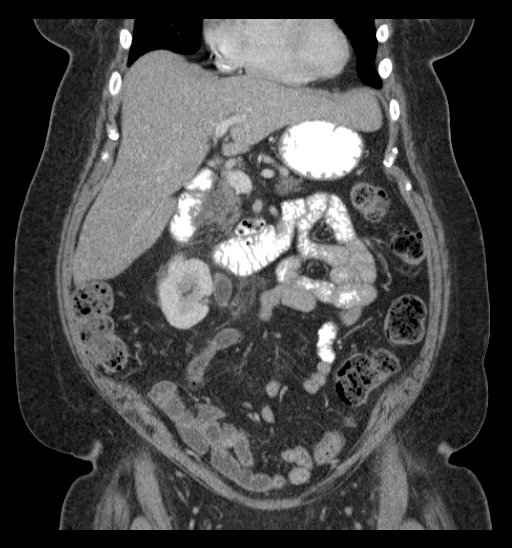
[im 56/100  soft-tissue]
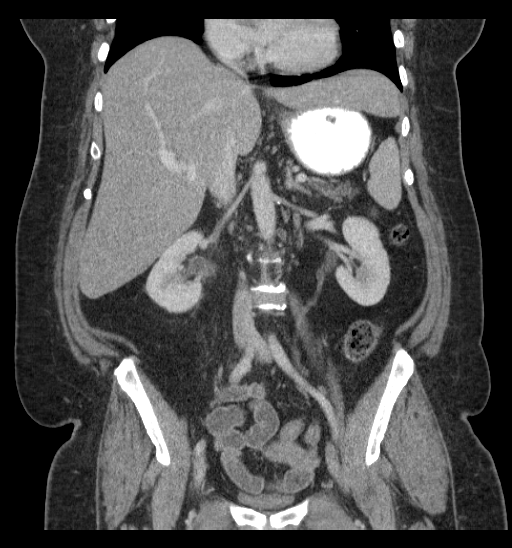

[16 of 46 positions shown; findings below may reference images not displayed]

FINDINGS: No consolidation or pleural effusion in the lung bases.

There is an extrarenal right renal pelvis with prominence of the
proximal-most ureter, transition is seen at the ureteropelvic
junction, this appears similar to prior exam. However there is
ureteric enhancement and heterogeneous enhancement of the right
kidney suggesting acute pyelonephritis. Moderate perinephric and
periureteric soft tissue stranding. No obstructing stone is seen.
Ureter distal to this is decompressed. The bladder is completely
decompressed and not evaluated. Homogeneous enhancement of the left
kidney without left hydronephrosis. No left ureteric enhancement.

Clips in the gallbladder fossa from cholecystectomy. Liver appears
mildly enlarged measuring 23.4 cm in craniocaudal dimension with
decreased hepatic density. No focal hepatic lesion. Spleen is normal
in size without localizing abnormality. Pancreas and adrenal glands
are normal.

The stomach is physiologically distended. There are no dilated or
thickened bowel loops. The appendix is not definitively visualized.
There is a moderate volume of colonic stool. No free air, free
fluid, or intra-abdominal fluid collection.

Abdominal aorta is normal in caliber mild atherosclerosis. No
retroperitoneal adenopathy. Fat containing anterior abdominal wall
hernias in the lower abdomen.

Within the pelvis the urinary bladder is completely decompressed not
well evaluated. The uterus and adnexa are normal for age. There is
no pelvic free fluid. No pelvic adenopathy.

There are no acute or suspicious osseous abnormalities. Multilevel
degenerative change with degenerative disc disease and facet
arthropathy.
IMPRESSION: 1. Findings consistent with right pyelonephritis. Extrarenal pelvis
configuration of the right kidney appear similar to prior exam.
There is no urolithiasis to suggest obstructive uropathy.
2. Postcholecystectomy. Unchanged anterior abdominal wall fat
containing ventral hernias. Mild hepatomegaly, suspect hepatic
steatosis.

## 2014-06-23 MED ORDER — HYDROMORPHONE HCL 1 MG/ML IJ SOLN
INTRAMUSCULAR | Status: AC
  Administered 2014-06-23: 1 mg via INTRAVENOUS
  Filled 2014-06-23: qty 1

## 2014-06-23 MED ORDER — HYDROMORPHONE HCL 1 MG/ML IJ SOLN
1.0000 mg | Freq: Once | INTRAMUSCULAR | Status: AC
  Administered 2014-06-23: 1 mg via INTRAVENOUS
  Filled 2014-06-23: qty 1

## 2014-06-23 MED ORDER — ONDANSETRON HCL 4 MG/2ML IJ SOLN
4.0000 mg | Freq: Once | INTRAMUSCULAR | Status: AC
  Administered 2014-06-23: 4 mg via INTRAVENOUS
  Filled 2014-06-23: qty 2

## 2014-06-23 MED ORDER — CEFTRIAXONE SODIUM 1 G IJ SOLR
INTRAMUSCULAR | Status: AC
  Administered 2014-06-23: 1000 mg
  Filled 2014-06-23: qty 10

## 2014-06-23 MED ORDER — HYDROMORPHONE HCL 1 MG/ML IJ SOLN
1.0000 mg | Freq: Once | INTRAMUSCULAR | Status: AC
  Administered 2014-06-23: 1 mg via INTRAVENOUS

## 2014-06-23 MED ORDER — IOHEXOL 300 MG/ML  SOLN
100.0000 mL | Freq: Once | INTRAMUSCULAR | Status: AC | PRN
  Administered 2014-06-23: 100 mL via INTRAVENOUS

## 2014-06-23 MED ORDER — IOHEXOL 300 MG/ML  SOLN
25.0000 mL | Freq: Once | INTRAMUSCULAR | Status: AC | PRN
  Administered 2014-06-23: 25 mL via ORAL

## 2014-06-23 MED ORDER — DEXTROSE 5 % IV SOLN: 1.0000 g | Freq: Once | INTRAVENOUS | Status: DC

## 2014-06-23 MED ORDER — SODIUM CHLORIDE 0.9 % IV BOLUS (SEPSIS)
1000.0000 mL | Freq: Once | INTRAVENOUS | Status: AC
  Administered 2014-06-23: 1000 mL via INTRAVENOUS

## 2014-06-23 NOTE — ED Provider Notes (Signed)
CSN: [id number]     Arrival date & time 06/23/14  1933 History  This chart was scribed for Anna Lyonsouglas Villa Burgin, MD by Freida Busmaniana Omoyeni, ED Scribe. This patient was seen in room MH05/MH05 and the patient's care was started 8:20 PM.    Chief Complaint  Patient presents with  . Abdominal Pain      Patient is a 59 y.o. female presenting with abdominal pain. The history is provided by the patient. No language interpreter was used.  Abdominal Pain Pain location:  RLQ and suprapubic Pain radiates to:  Suprapubic region Pain severity:  Moderate Timing:  Constant Progression:  Worsening Chronicity:  New Worsened by:  Position changes Associated symptoms: dysuria, nausea and vomiting   Associated symptoms: no constipation and no diarrhea     HPI Comments:  Anna [name] is a 59 y.o. female with a h/o DM, urosepsis and a cholecystectomy who presents to the Emergency Department complaining of moderate  RLQ abdominal pain that radiates into mid abdomen for 2-3 days.  She reports associated nausea and vomiting, mild difficulty urinating and dysuria. She denies blood in her stool, diarrhea and constipation. She notes her pain is exacerbated when laying on her side and improved when laying on her back.   Past Medical History  Diagnosis Date  . Bronchitis   . Diabetic neuropathy   . Diabetic retinopathy   . Arthritis   . Hypertension   . Hyperlipemia   . Obesity   . Diabetes mellitus   . Migraine   . Back pain   . Bursitis   . Sepsis due to urinary tract infection    Past Surgical History  Procedure Laterality Date  . Cesarean section      x3  . Cholecystectomy    . Eye surgery Left    No family history on file. History  Substance Use Topics  . Smoking status: Never Smoker   . Smokeless tobacco: Never Used  . Alcohol Use: No   OB History    No data available     Review of Systems  Gastrointestinal: Positive for nausea, vomiting and abdominal pain. Negative for diarrhea and  constipation.  Genitourinary: Positive for dysuria and difficulty urinating.  All other systems reviewed and are negative.     Allergies  Morphine and related; Ciprofloxacin; and Soma  Home Medications   Prior to Admission medications   Medication Sig Start Date End Date Taking? Authorizing Provider  ALPRAZolam (XANAX PO) Take by mouth.   Yes Historical Provider, MD  Cyclobenzaprine HCl (FLEXERIL PO) Take by mouth.    Historical Provider, MD  FLUoxetine (PROZAC) 20 MG capsule Take 20 mg by mouth daily.    Historical Provider, MD  gabapentin (NEURONTIN) 300 MG capsule Take 300 mg by mouth 3 (three) times daily.    Historical Provider, MD  glyBURIDE (DIABETA) 1.25 MG tablet Take 1.25 mg by mouth daily with breakfast.    Historical Provider, MD  HYDROCHLOROTHIAZIDE PO Take by mouth.    Historical Provider, MD  insulin glargine (LANTUS) 100 UNIT/ML injection Inject 80 Units into the skin at bedtime.     Historical Provider, MD  insulin regular (NOVOLIN R,HUMULIN R) 100 units/mL injection Inject into the skin 3 (three) times daily before meals.    Historical Provider, MD  metFORMIN (GLUMETZA) 1000 MG (MOD) 24 hr tablet Take 1,000 mg by mouth 3 (three) times daily.    Historical Provider, MD  Omeprazole (PRILOSEC PO) Take by mouth.    Historical Provider,  MD  Potassium Chloride Crys CR (K-DUR PO) Take by mouth.    Historical Provider, MD  TRAMADOL HCL PO Take by mouth.    Historical Provider, MD   BP 126/75 mmHg  Pulse 98  Temp(Src) 100.3 F (37.9 C) (Oral)  Resp 20  Ht  (1.575 m)  Wt 214 lb (97.07 kg)  BMI 39.13 kg/m2  SpO2 98% Physical Exam  Constitutional: She is oriented to person, place, and time. She appears well-developed and well-nourished. No distress.  HENT:  Head: Normocephalic and atraumatic.  Right Ear: Hearing normal.  Left Ear: Hearing normal.  Nose: Nose normal.  Mouth/Throat: Oropharynx is clear and moist and mucous membranes are normal.  Eyes: Conjunctivae  and EOM are normal. Pupils are equal, round, and reactive to light.  Neck: Normal range of motion. Neck supple.  Cardiovascular: Regular rhythm, S1 normal and S2 normal.  Exam reveals no gallop and no friction rub.   No murmur heard. Pulmonary/Chest: Effort normal and breath sounds normal. No respiratory distress. She exhibits no tenderness.  Abdominal: Soft. Normal appearance and bowel sounds are normal. She exhibits no distension. There is no hepatosplenomegaly. There is tenderness. There is no rebound, no guarding, no tenderness at McBurney's point and negative Murphy's sign. No hernia.  TTP in the right lower and upper quadrants   Musculoskeletal: Normal range of motion.  Neurological: She is alert and oriented to person, place, and time. She has normal strength. No cranial nerve deficit or sensory deficit. Coordination normal. GCS eye subscore is 4. GCS verbal subscore is 5. GCS motor subscore is 6.  Skin: Skin is warm, dry and intact. No rash noted. No cyanosis.  Psychiatric: She has a normal mood and affect. Her speech is normal and behavior is normal. Thought content normal.  Nursing note and vitals reviewed.   ED Course  Procedures   DIAGNOSTIC STUDIES:  Oxygen Saturation is 98% on RA, normal by my interpretation.    COORDINATION OF CARE:  8:27 PM Discussed treatment plan with pt at bedside and pt agreed to plan.  Labs Review Labs Reviewed  URINALYSIS, ROUTINE W REFLEX MICROSCOPIC - Abnormal; Notable for the following:    APPearance CLOUDY (*)    Glucose, UA >1000 (*)    Hgb urine dipstick SMALL (*)    Leukocytes, UA LARGE (*)    All other components within normal limits  URINE MICROSCOPIC-ADD ON - Abnormal; Notable for the following:    Squamous Epithelial / LPF FEW (*)    Bacteria, UA MANY (*)    All other components within normal limits    Imaging Review No results found.   EKG Interpretation None      MDM   Final diagnoses:  None    Patient with  history of DM, Urosepsis.  Presents with complaints of right sided abd pain, dysuria.  She has a low grade fever, leukocytosis and ct scan that is consistent with pyelonephritis.  She is having a significant amount of discomfort and will be treated with iv antibiotics and admission to [facility].  Dr. Virgina Organ to accept in transfer.  Blood cultures were obtained prior to administration of antibiotics.  Blood pressures stable and lactate normal, however borderline tachycardia present.    I personally performed the services described in this documentation, which was scribed in my presence. The recorded information has been reviewed and is accurate.      Anna Lyons, MD 06/24/14 [phone]

## 2014-06-23 NOTE — ED Notes (Signed)
RLQ pain x 3 days-dysuria, n/v today

## 2014-06-24 ENCOUNTER — Encounter (HOSPITAL_COMMUNITY): Payer: Self-pay | Admitting: *Deleted

## 2014-06-24 DIAGNOSIS — N10 Acute tubulo-interstitial nephritis: Principal | ICD-10-CM

## 2014-06-24 DIAGNOSIS — E119 Type 2 diabetes mellitus without complications: Secondary | ICD-10-CM

## 2014-06-24 DIAGNOSIS — I10 Essential (primary) hypertension: Secondary | ICD-10-CM

## 2014-06-24 DIAGNOSIS — J45909 Unspecified asthma, uncomplicated: Secondary | ICD-10-CM | POA: Diagnosis present

## 2014-06-24 DIAGNOSIS — E118 Type 2 diabetes mellitus with unspecified complications: Secondary | ICD-10-CM

## 2014-06-24 DIAGNOSIS — N12 Tubulo-interstitial nephritis, not specified as acute or chronic: Secondary | ICD-10-CM | POA: Insufficient documentation

## 2014-06-24 DIAGNOSIS — J449 Chronic obstructive pulmonary disease, unspecified: Secondary | ICD-10-CM | POA: Diagnosis present

## 2014-06-24 DIAGNOSIS — E1142 Type 2 diabetes mellitus with diabetic polyneuropathy: Secondary | ICD-10-CM

## 2014-06-24 DIAGNOSIS — E1140 Type 2 diabetes mellitus with diabetic neuropathy, unspecified: Secondary | ICD-10-CM | POA: Diagnosis present

## 2014-06-24 LAB — COMPREHENSIVE METABOLIC PANEL
ALT: 86 U/L — ABNORMAL HIGH (ref 0–35)
AST: 139 U/L — ABNORMAL HIGH (ref 0–37)
Albumin: 3.3 g/dL — ABNORMAL LOW (ref 3.5–5.2)
Alkaline Phosphatase: 158 U/L — ABNORMAL HIGH (ref 39–117)
Anion gap: 11 (ref 5–15)
BILIRUBIN TOTAL: 0.6 mg/dL (ref 0.3–1.2)
BUN: 9 mg/dL (ref 6–23)
CHLORIDE: 101 mmol/L (ref 96–112)
CO2: 24 mmol/L (ref 19–32)
Calcium: 8.5 mg/dL (ref 8.4–10.5)
Creatinine, Ser: 0.75 mg/dL (ref 0.50–1.10)
Glucose, Bld: 216 mg/dL — ABNORMAL HIGH (ref 70–99)
Potassium: 3.5 mmol/L (ref 3.5–5.1)
Sodium: 136 mmol/L (ref 135–145)
Total Protein: 6.9 g/dL (ref 6.0–8.3)

## 2014-06-24 LAB — GLUCOSE, CAPILLARY
GLUCOSE-CAPILLARY: 206 mg/dL — AB (ref 70–99)
GLUCOSE-CAPILLARY: 173 mg/dL — AB (ref 70–99)
Glucose-Capillary: 206 mg/dL — ABNORMAL HIGH (ref 70–99)
Glucose-Capillary: 162 mg/dL — ABNORMAL HIGH (ref 70–99)
Glucose-Capillary: 169 mg/dL — ABNORMAL HIGH (ref 70–99)

## 2014-06-24 LAB — CBC
HCT: 39.5 % (ref 36.0–46.0)
HEMOGLOBIN: 12.6 g/dL (ref 12.0–15.0)
MCH: 27.8 pg (ref 26.0–34.0)
MCHC: 31.9 g/dL (ref 30.0–36.0)
MCV: 87 fL (ref 78.0–100.0)
Platelets: 321 10*3/uL (ref 150–400)
RBC: 4.54 MIL/uL (ref 3.87–5.11)
RDW: 13.2 % (ref 11.5–15.5)
WBC: 9.9 10*3/uL (ref 4.0–10.5)

## 2014-06-24 MED ORDER — ONDANSETRON HCL 4 MG/2ML IJ SOLN
4.0000 mg | Freq: Four times a day (QID) | INTRAMUSCULAR | Status: DC | PRN
  Administered 2014-06-24: 4 mg via INTRAVENOUS
  Filled 2014-06-24: qty 2

## 2014-06-24 MED ORDER — INSULIN ASPART 100 UNIT/ML ~~LOC~~ SOLN
0.0000 [IU] | Freq: Three times a day (TID) | SUBCUTANEOUS | Status: DC
  Administered 2014-06-24: 5 [IU] via SUBCUTANEOUS
  Administered 2014-06-24 – 2014-06-25 (×4): 3 [IU] via SUBCUTANEOUS

## 2014-06-24 MED ORDER — LISINOPRIL 5 MG PO TABS
5.0000 mg | ORAL_TABLET | Freq: Every day | ORAL | Status: DC
  Administered 2014-06-24 – 2014-06-25 (×2): 5 mg via ORAL
  Filled 2014-06-24 (×2): qty 1

## 2014-06-24 MED ORDER — ONDANSETRON HCL 4 MG PO TABS
4.0000 mg | ORAL_TABLET | Freq: Four times a day (QID) | ORAL | Status: DC | PRN
  Administered 2014-06-24: 4 mg via ORAL
  Filled 2014-06-24 (×2): qty 1

## 2014-06-24 MED ORDER — INSULIN ASPART 100 UNIT/ML ~~LOC~~ SOLN
0.0000 [IU] | Freq: Every day | SUBCUTANEOUS | Status: DC
  Administered 2014-06-24: 2 [IU] via SUBCUTANEOUS

## 2014-06-24 MED ORDER — TOPIRAMATE 25 MG PO TABS
25.0000 mg | ORAL_TABLET | Freq: Every day | ORAL | Status: DC
  Administered 2014-06-24 – 2014-06-25 (×2): 25 mg via ORAL
  Filled 2014-06-24 (×2): qty 1

## 2014-06-24 MED ORDER — FLUOXETINE HCL 20 MG PO CAPS
20.0000 mg | ORAL_CAPSULE | Freq: Every day | ORAL | Status: DC
  Administered 2014-06-24 – 2014-06-25 (×2): 20 mg via ORAL
  Filled 2014-06-24 (×2): qty 1

## 2014-06-24 MED ORDER — GUAIFENESIN-DM 100-10 MG/5ML PO SYRP
5.0000 mL | ORAL_SOLUTION | ORAL | Status: DC | PRN
  Administered 2014-06-24: 5 mL via ORAL
  Filled 2014-06-24 (×2): qty 5

## 2014-06-24 MED ORDER — HYDROCODONE-ACETAMINOPHEN 10-325 MG PO TABS
1.0000 | ORAL_TABLET | Freq: Four times a day (QID) | ORAL | Status: DC | PRN
Start: 2014-06-24 — End: 2014-06-25
  Administered 2014-06-24: 1 via ORAL
  Filled 2014-06-24: qty 1

## 2014-06-24 MED ORDER — ACETAMINOPHEN 650 MG RE SUPP: 650.0000 mg | Freq: Four times a day (QID) | RECTAL | Status: DC | PRN

## 2014-06-24 MED ORDER — INSULIN GLARGINE 100 UNIT/ML ~~LOC~~ SOLN
100.0000 [IU] | Freq: Every day | SUBCUTANEOUS | Status: DC
  Administered 2014-06-24: 100 [IU] via SUBCUTANEOUS
  Filled 2014-06-24 (×2): qty 1

## 2014-06-24 MED ORDER — ENOXAPARIN SODIUM 40 MG/0.4ML ~~LOC~~ SOLN
40.0000 mg | SUBCUTANEOUS | Status: DC
  Administered 2014-06-24 – 2014-06-25 (×2): 40 mg via SUBCUTANEOUS
  Filled 2014-06-24 (×2): qty 0.4

## 2014-06-24 MED ORDER — SODIUM CHLORIDE 0.9 % IV SOLN
INTRAVENOUS | Status: AC
  Administered 2014-06-24: 01:00:00 via INTRAVENOUS

## 2014-06-24 MED ORDER — CEFTRIAXONE SODIUM IN DEXTROSE 20 MG/ML IV SOLN
1.0000 g | Freq: Once | INTRAVENOUS | Status: DC
  Filled 2014-06-24: qty 50

## 2014-06-24 MED ORDER — SODIUM CHLORIDE 0.9 % IV SOLN
INTRAVENOUS | Status: AC
  Administered 2014-06-24 (×2): via INTRAVENOUS

## 2014-06-24 MED ORDER — ACETAMINOPHEN 325 MG PO TABS: 650.0000 mg | ORAL_TABLET | Freq: Four times a day (QID) | ORAL | Status: DC | PRN

## 2014-06-24 MED ORDER — CYCLOBENZAPRINE HCL 10 MG PO TABS: 5.0000 mg | ORAL_TABLET | Freq: Three times a day (TID) | ORAL | Status: DC | PRN

## 2014-06-24 MED ORDER — TRAMADOL HCL 50 MG PO TABS
25.0000 mg | ORAL_TABLET | Freq: Three times a day (TID) | ORAL | Status: DC | PRN
  Administered 2014-06-24 (×2): 25 mg via ORAL
  Filled 2014-06-24 (×2): qty 1

## 2014-06-24 MED ORDER — PANTOPRAZOLE SODIUM 40 MG PO TBEC
40.0000 mg | DELAYED_RELEASE_TABLET | Freq: Every day | ORAL | Status: DC
  Administered 2014-06-24 – 2014-06-25 (×2): 40 mg via ORAL
  Filled 2014-06-24 (×2): qty 1

## 2014-06-24 MED ORDER — CYCLOBENZAPRINE HCL 5 MG PO TABS
5.0000 mg | ORAL_TABLET | Freq: Two times a day (BID) | ORAL | Status: DC
  Administered 2014-06-24 – 2014-06-25 (×2): 5 mg via ORAL
  Filled 2014-06-24 (×3): qty 1

## 2014-06-24 MED ORDER — ALPRAZOLAM 0.5 MG PO TABS: 1.0000 mg | ORAL_TABLET | Freq: Every day | ORAL | Status: DC | PRN

## 2014-06-24 MED ORDER — INFLUENZA VAC SPLIT QUAD 0.5 ML IM SUSY
0.5000 mL | PREFILLED_SYRINGE | INTRAMUSCULAR | Status: AC
  Administered 2014-06-25: 0.5 mL via INTRAMUSCULAR
  Filled 2014-06-24: qty 0.5

## 2014-06-24 MED ORDER — HYDROCHLOROTHIAZIDE 25 MG PO TABS
25.0000 mg | ORAL_TABLET | Freq: Every day | ORAL | Status: DC
  Filled 2014-06-24: qty 1

## 2014-06-24 MED ORDER — GABAPENTIN 300 MG PO CAPS
300.0000 mg | ORAL_CAPSULE | Freq: Three times a day (TID) | ORAL | Status: DC
  Administered 2014-06-24 – 2014-06-25 (×5): 300 mg via ORAL
  Filled 2014-06-24 (×6): qty 1

## 2014-06-24 MED ORDER — CEFTRIAXONE SODIUM IN DEXTROSE 20 MG/ML IV SOLN
1.0000 g | INTRAVENOUS | Status: DC
  Administered 2014-06-24: 1 g via INTRAVENOUS
  Filled 2014-06-24 (×2): qty 50

## 2014-06-24 MED ORDER — ALPRAZOLAM 0.5 MG PO TABS: 1.0000 mg | ORAL_TABLET | Freq: Three times a day (TID) | ORAL | Status: DC | PRN

## 2014-06-24 MED ORDER — IPRATROPIUM-ALBUTEROL 0.5-2.5 (3) MG/3ML IN SOLN: 3.0000 mL | Freq: Four times a day (QID) | RESPIRATORY_TRACT | Status: DC | PRN

## 2014-06-24 MED ORDER — INSULIN GLARGINE 100 UNIT/ML ~~LOC~~ SOLN
25.0000 [IU] | Freq: Once | SUBCUTANEOUS | Status: AC
  Administered 2014-06-24: 25 [IU] via SUBCUTANEOUS
  Filled 2014-06-24 (×2): qty 0.25

## 2014-06-24 NOTE — H&P (Signed)
[facility] History and Physical  Patient: Anna [name]  MRN: [id number]  DOB: 1955-09-21  DOS: the patient was seen and examined on 06/24/2014 PCP: Pcp Not In System  Chief Complaint: Right-sided flank pain  HPI: BRADY PLANT is a 59 y.o. female with Past medical history of diabetic neuropathy, type diabetes mellitus, hypertension, migraine, obesity, asthma with chronic cough.. The patient is presenting with complaints of right-sided flank pain. She also had some suprapubic pain. This also has been ongoing since last 3 days. She denies any burning urination. She complains of nausea as well as vomiting today. Therefore she came to the hospital. She denies any chest pain shortness of breath fever or chills. She has some cough which is chronic and unchanged. She complains of some leg swelling which is unchanged as well. She denies any focal deficit. She has been compliant with all her medications. She reportedly mentions she's taking trimethoprim daily.  The patient is coming from home. And at her baseline independent for most of her ADL.  Review of Systems: as mentioned in the history of present illness.  A Comprehensive review of the other systems is negative.  Past Medical History  Diagnosis Date  . Bronchitis   . Diabetic neuropathy   . Diabetic retinopathy   . Arthritis   . Hypertension   . Hyperlipemia   . Obesity   . Diabetes mellitus   . Migraine   . Back pain   . Bursitis   . Sepsis due to urinary tract infection   . Asthma    Past Surgical History  Procedure Laterality Date  . Cesarean section      x3  . Cholecystectomy    . Eye surgery Left    Social History:  reports that she has never smoked. She has never used smokeless tobacco. She reports that she does not drink alcohol or use illicit drugs.  Allergies  Allergen Reactions  . Morphine And Related Hives  . Ciprofloxacin     Tongue swells  . Soma [Carisoprodol]     Sleepy and  constipation  . Lasix [Furosemide] Rash    History reviewed. No pertinent family history.  Prior to Admission medications   Medication Sig Start Date End Date Taking? Authorizing Provider  ALPRAZolam (XANAX PO) Take 1 mg by mouth daily as needed.    Yes Historical Provider, MD  hydrochlorothiazide (HYDRODIURIL) 25 MG tablet Take 25 mg by mouth daily.   Yes Historical Provider, MD  HYDROcodone-acetaminophen (NORCO) 10-325 MG per tablet Take 1 tablet by mouth every 6 (six) hours as needed.   Yes Historical Provider, MD  ipratropium-albuterol (DUONEB) 0.5-2.5 (3) MG/3ML SOLN Take 3 mLs by nebulization every 6 (six) hours as needed.   Yes Historical Provider, MD  lisinopril (PRINIVIL,ZESTRIL) 5 MG tablet Take 5 mg by mouth daily.   Yes Historical Provider, MD  pantoprazole (PROTONIX) 40 MG tablet Take 40 mg by mouth daily.   Yes Historical Provider, MD  topiramate (TOPAMAX) 25 MG tablet Take 25 mg by mouth daily.   Yes Historical Provider, MD  Cyclobenzaprine HCl (FLEXERIL PO) Take 5 mg by mouth 3 (three) times daily as needed.     Historical Provider, MD  FLUoxetine (PROZAC) 20 MG capsule Take 20 mg by mouth daily.    Historical Provider, MD  gabapentin (NEURONTIN) 300 MG capsule Take 300 mg by mouth 3 (three) times daily.    Historical Provider, MD  insulin glargine (LANTUS) 100 UNIT/ML injection Inject 100 Units into  the skin at bedtime.     Historical Provider, MD  insulin regular (NOVOLIN R,HUMULIN R) 100 units/mL injection Inject into the skin 3 (three) times daily before meals.    Historical Provider, MD  metFORMIN (GLUMETZA) 1000 MG (MOD) 24 hr tablet Take 1,000 mg by mouth 3 (three) times daily.    Historical Provider, MD  Potassium Chloride Crys CR (K-DUR PO) Take 20 mEq by mouth daily.     Historical Provider, MD  TRAMADOL HCL PO Take 25 mg by mouth 3 (three) times daily as needed.     Historical Provider, MD    Physical Exam: Filed Vitals:   06/23/14 2329 06/24/14 0036 06/24/14  0041 06/24/14 0535  BP: 121/58  111/66 106/55  Pulse: 94  91 81  Temp: 98.2 F (36.8 C)  98.6 F (37 C) 98.6 F (37 C)  TempSrc:   Oral Oral  Resp: Height:   (1.575 m)    Weight:  96.072 kg (211 lb 12.8 oz)  96.072 kg (211 lb 12.8 oz)  SpO2: 94%  100% 96%    General: Alert, Awake and Oriented to Time, Place and Person. Appear in mild distress Eyes: PERRL ENT: Oral Mucosa clear moist. Neck: no JVD Cardiovascular: S1 and S2 Present, no Murmur, Peripheral Pulses Present Respiratory: Bilateral Air entry equal and Decreased, Clear to Auscultation, noCrackles, no wheezes Abdomen: Bowel Sound presnt, Soft and minimally tender Skin: no Rash Extremities: Trace  Pedal edema, non calf tenderness Neurologic: Grossly no focal neuro deficit.  Labs on Admission:  CBC:  Recent Labs Lab 06/23/14 2026  WBC 13.4*  NEUTROABS 8.5*  HGB 13.7  HCT 41.9  MCV 85.5  PLT 359    CMP     Component Value Date/Time   NA 135 06/23/2014 2026   K 3.4* 06/23/2014 2026   CL 99 06/23/2014 2026   CO2 26 06/23/2014 2026   GLUCOSE 268* 06/23/2014 2026   BUN 13 06/23/2014 2026   CREATININE 0.74 06/23/2014 2026   CALCIUM 9.3 06/23/2014 2026   PROT 8.0 06/23/2014 2026   ALBUMIN 3.8 06/23/2014 2026   AST 17 06/23/2014 2026   ALT 18 06/23/2014 2026   ALKPHOS 70 06/23/2014 2026   BILITOT 0.1* 06/23/2014 2026   GFRNONAA >90 06/23/2014 2026   GFRAA >90 06/23/2014 2026     Recent Labs Lab 06/23/14 2026  LIPASE 20    No results for input(s): CKTOTAL, CKMB, CKMBINDEX, TROPONINI in the last 168 hours. BNP (last 3 results) No results for input(s): BNP in the last 8760 hours.  ProBNP (last 3 results) No results for input(s): PROBNP in the last 8760 hours.   Radiological Exams on Admission: Ct Abdomen Pelvis W Contrast  06/23/2014   CLINICAL DATA:  Right lower quadrant abdominal pain for 3 days, dysuria, nausea and vomiting today.  EXAM: CT ABDOMEN AND PELVIS WITH CONTRAST   TECHNIQUE: Multidetector CT imaging of the abdomen and pelvis was performed using the standard protocol following bolus administration of intravenous contrast.  CONTRAST:  100 mL Omnipaque 300 IV  COMPARISON:  CT 09/28/2012  FINDINGS: No consolidation or pleural effusion in the lung bases.  There is an extrarenal right renal pelvis with prominence of the proximal-most ureter, transition is seen at the ureteropelvic junction, this appears similar to prior exam. However there is ureteric enhancement and heterogeneous enhancement of the right kidney suggesting acute pyelonephritis. Moderate perinephric and periureteric soft tissue stranding. No obstructing stone is seen. Ureter  distal to this is decompressed. The bladder is completely decompressed and not evaluated. Homogeneous enhancement of the left kidney without left hydronephrosis. No left ureteric enhancement.  Clips in the gallbladder fossa from cholecystectomy. Liver appears mildly enlarged measuring 23.4 cm in craniocaudal dimension with decreased hepatic density. No focal hepatic lesion. Spleen is normal in size without localizing abnormality. Pancreas and adrenal glands are normal.  The stomach is physiologically distended. There are no dilated or thickened bowel loops. The appendix is not definitively visualized. There is a moderate volume of colonic stool. No free air, free fluid, or intra-abdominal fluid collection.  Abdominal aorta is normal in caliber mild atherosclerosis. No retroperitoneal adenopathy. Fat containing anterior abdominal wall hernias in the lower abdomen.  Within the pelvis the urinary bladder is completely decompressed not well evaluated. The uterus and adnexa are normal for age. There is no pelvic free fluid. No pelvic adenopathy.  There are no acute or suspicious osseous abnormalities. Multilevel degenerative change with degenerative disc disease and facet arthropathy.  IMPRESSION: 1. Findings consistent with right pyelonephritis.  Extrarenal pelvis configuration of the right kidney appear similar to prior exam. There is no urolithiasis to suggest obstructive uropathy. 2. Postcholecystectomy. Unchanged anterior abdominal wall fat containing ventral hernias. Mild hepatomegaly, suspect hepatic steatosis.   Electronically Signed   By: Rubye OaksMelanie  Ehinger M.D.   On: 06/23/2014 22:04    Assessment/Plan Principal Problem:   Acute pyelonephritis Active Problems:   Diabetic neuropathy   Hypertension   Type 2 diabetes mellitus   Asthma   1. Acute pyelonephritis The patient is presenting with complaints of nausea vomiting as well as right-sided flank pain. Workup shows that she has right-sided pyelonephritis based on the CT scan. She doesn't appear to be having any obstructive uropathy. Patient has received ceftriaxone. We will continue the same. Holding her diuretic, and giving her IV hydration.  Follow cultures.  2. chronic cough. Asthma. Patient does not appear to be having any asthma observation. Unchanged chronic cough. Symptomatically management.  3.Diabetes, diabetic neuropathy. Continuing home medications. Placing her on sliding scale.  4.Migraine. Continue Topamax.  5.GERD. Continue PPI.   Advance goals of care discussion: Full code  DVT Prophylaxis: subcutaneous Heparin Nutrition: Regular diet   Family Communication: family  was present at bedside, opportunity was given to ask question and all questions were answered satisfactorily at the time of interview. Disposition: Admitted to inpatient in med-surge unit.  Author: Lynden OxfordPranav Neftali Abair, MD [facility] Pager: [phone] 06/24/2014,     If 7PM-7AM, please contact night-coverage [email or web address] Password [email or web address]

## 2014-06-24 NOTE — Clinical Social Work Note (Signed)
Clinical Social Worker received referral for medication assistance.  CSW to notify CM of patient and treatment team concerns.  Clinical Social Worker will sign off for now as social work intervention is no longer needed. Please consult us again if new need arises.  Macario GoldsJesse Login [place], [place] [phone]

## 2014-06-24 NOTE — Progress Notes (Signed)
Utilization review completed. Isaid Salvia, RN, BSN. 

## 2014-06-24 NOTE — Progress Notes (Signed)
Patient seen and examined  fever seems to have resolved  White count improving  13.4> 9.9  CBG elevated     patient still complaining of some right flank pain    Plan    Right-sided pyelonephritis-follow urine culture, blood culture results , patient still nauseous  therefore continue with IV antibiotics and switch antibiotics based on culture and sensitivity     diabetes mellitus uncontrolled   administer 25 units of Lantus now   As CBG significantly elevated   continue SSI    hypertension Continue lisinopril    chronic back pain Continue with Flexeril   anxiety resumed Xanax

## 2014-06-24 NOTE — Progress Notes (Signed)
59yo female c/o RLQ pain w/ dysuria x3d, to begin IV ABX.  Will start Rocephin 1g IV Q24H and monitor CBC and Cx.  Vernard GamblesVeronda Phill Steck, PharmD, BCPS 06/24/2014 1:50 AM

## 2014-06-25 LAB — COMPREHENSIVE METABOLIC PANEL
ALBUMIN: 2.9 g/dL — AB (ref 3.5–5.2)
ALK PHOS: 117 U/L (ref 39–117)
ALT: 49 U/L — ABNORMAL HIGH (ref 0–35)
ANION GAP: 6 (ref 5–15)
AST: 39 U/L — AB (ref 0–37)
BILIRUBIN TOTAL: 0.3 mg/dL (ref 0.3–1.2)
BUN: 9 mg/dL (ref 6–23)
CHLORIDE: 104 mmol/L (ref 96–112)
CO2: 31 mmol/L (ref 19–32)
Calcium: 8.5 mg/dL (ref 8.4–10.5)
Creatinine, Ser: 0.74 mg/dL (ref 0.50–1.10)
GFR calc Af Amer: >90 mL/min (ref >90)
GFR calc non Af Amer: >90 mL/min (ref >90)
Glucose, Bld: 152 mg/dL — ABNORMAL HIGH (ref 70–99)
Potassium: 3.8 mmol/L (ref 3.5–5.1)
SODIUM: 141 mmol/L (ref 135–145)
TOTAL PROTEIN: 5.8 g/dL — AB (ref 6.0–8.3)

## 2014-06-25 LAB — GLUCOSE, CAPILLARY
Glucose-Capillary: 120 mg/dL — ABNORMAL HIGH (ref 70–99)
Glucose-Capillary: 181 mg/dL — ABNORMAL HIGH (ref 70–99)
Glucose-Capillary: 188 mg/dL — ABNORMAL HIGH (ref 70–99)

## 2014-06-25 LAB — CBC
HCT: 34.9 % — ABNORMAL LOW (ref 36.0–46.0)
Hemoglobin: 11.1 g/dL — ABNORMAL LOW (ref 12.0–15.0)
MCH: 28 pg (ref 26.0–34.0)
MCHC: 31.8 g/dL (ref 30.0–36.0)
MCV: 88.1 fL (ref 78.0–100.0)
PLATELETS: 304 10*3/uL (ref 150–400)
RBC: 3.96 MIL/uL (ref 3.87–5.11)
RDW: 13.2 % (ref 11.5–15.5)
WBC: 7.7 10*3/uL (ref 4.0–10.5)

## 2014-06-25 MED ORDER — HYDROCODONE-ACETAMINOPHEN 10-325 MG PO TABS: 1.0000 | ORAL_TABLET | Freq: Four times a day (QID) | ORAL | Status: DC | PRN

## 2014-06-25 MED ORDER — CEPHALEXIN 500 MG PO CAPS: 500.0000 mg | ORAL_CAPSULE | Freq: Three times a day (TID) | ORAL | Status: AC

## 2014-06-25 MED ORDER — PROMETHAZINE HCL 12.5 MG PO TABS
12.5000 mg | ORAL_TABLET | Freq: Four times a day (QID) | ORAL | Status: DC | PRN
Start: 2014-06-25 — End: 2021-10-06

## 2014-06-25 MED ORDER — CEPHALEXIN 500 MG PO CAPS
500.0000 mg | ORAL_CAPSULE | Freq: Three times a day (TID) | ORAL | Status: DC
  Administered 2014-06-25: 500 mg via ORAL
  Filled 2014-06-25 (×3): qty 1

## 2014-06-25 MED ORDER — HYDROCHLOROTHIAZIDE 25 MG PO TABS: 25.0000 mg | ORAL_TABLET | Freq: Every day | ORAL | Status: DC

## 2014-06-25 NOTE — Discharge Summary (Signed)
Physician Discharge Summary  YATZARI JONSSON MRN: [id number] DOB/AGE: February 20, 1956 59 y.o.  PCP: Pcp Not In System   Admit date: 06/23/2014 Discharge date: 06/25/2014  Discharge Diagnoses:     Principal Problem:   Acute pyelonephritis UTI   Diabetic neuropathy   Hypertension   Type 2 diabetes mellitus   Asthma   Follow-up recommendations Follow-up with PCP in 5-7 days Follow-up CBC, CMP in 1 week hold HCTZ until seen by PCP next week    Medication List    TAKE these medications        ALPRAZolam 1 MG tablet  Commonly known as:  XANAX  Take 1 mg by mouth 3 (three) times daily as needed for anxiety.     cephALEXin 500 MG capsule  Commonly known as:  KEFLEX  Take 1 capsule (500 mg total) by mouth 3 (three) times daily.     cyclobenzaprine 5 MG tablet  Commonly known as:  FLEXERIL  Take 5 mg by mouth 2 (two) times daily.     FLUoxetine 20 MG capsule  Commonly known as:  PROZAC  Take 20 mg by mouth daily.     gabapentin 300 MG capsule  Commonly known as:  NEURONTIN  Take 300 mg by mouth 3 (three) times daily.     hydrochlorothiazide 25 MG tablet  Commonly known as:  HYDRODIURIL  Take 1 tablet (25 mg total) by mouth daily.  Start taking on:  06/29/2014     HYDROcodone-acetaminophen 10-325 MG per tablet  Commonly known as:  NORCO  Take 1 tablet by mouth every 6 (six) hours as needed.     insulin glargine 100 UNIT/ML injection  Commonly known as:  LANTUS  Inject 100 Units into the skin at bedtime.     insulin lispro 100 UNIT/ML injection  Commonly known as:  HUMALOG  Inject 2-15 Units into the skin daily as needed for high blood sugar. Use per sliding scale when sugar is high but not over 400     insulin regular 100 units/mL injection  Commonly known as:  NOVOLIN R,HUMULIN R  Inject 20-35 Units into the skin 3 (three) times daily as needed for high blood sugar. Per sliding scale     ipratropium-albuterol 0.5-2.5 (3) MG/3ML Soln  Commonly known as:   DUONEB  Take 3 mLs by nebulization every 6 (six) hours as needed.     K-DUR PO  Take 20 mEq by mouth daily.     lisinopril 5 MG tablet  Commonly known as:  PRINIVIL,ZESTRIL  Take 5 mg by mouth daily.     metFORMIN 1000 MG (MOD) 24 hr tablet  Commonly known as:  GLUMETZA  Take 1,000 mg by mouth 3 (three) times daily.     pantoprazole 40 MG tablet  Commonly known as:  PROTONIX  Take 40 mg by mouth daily.     promethazine 12.5 MG tablet  Commonly known as:  PHENERGAN  Take 1 tablet (12.5 mg total) by mouth every 6 (six) hours as needed for nausea or vomiting.     topiramate 25 MG tablet  Commonly known as:  TOPAMAX  Take 25 mg by mouth daily.     TRAMADOL HCL PO  Take 25 mg by mouth 3 (three) times daily as needed.        Discharge Condition: Stable   Disposition: 01-Home or Self Care   Consults:  None   Significant Diagnostic Studies: Ct Abdomen Pelvis W Contrast  06/23/2014   CLINICAL  DATA:  Right lower quadrant abdominal pain for 3 days, dysuria, nausea and vomiting today.  EXAM: CT ABDOMEN AND PELVIS WITH CONTRAST  TECHNIQUE: Multidetector CT imaging of the abdomen and pelvis was performed using the standard protocol following bolus administration of intravenous contrast.  CONTRAST:  100 mL Omnipaque 300 IV  COMPARISON:  CT 09/28/2012  FINDINGS: No consolidation or pleural effusion in the lung bases.  There is an extrarenal right renal pelvis with prominence of the proximal-most ureter, transition is seen at the ureteropelvic junction, this appears similar to prior exam. However there is ureteric enhancement and heterogeneous enhancement of the right kidney suggesting acute pyelonephritis. Moderate perinephric and periureteric soft tissue stranding. No obstructing stone is seen. Ureter distal to this is decompressed. The bladder is completely decompressed and not evaluated. Homogeneous enhancement of the left kidney without left hydronephrosis. No left ureteric  enhancement.  Clips in the gallbladder fossa from cholecystectomy. Liver appears mildly enlarged measuring 23.4 cm in craniocaudal dimension with decreased hepatic density. No focal hepatic lesion. Spleen is normal in size without localizing abnormality. Pancreas and adrenal glands are normal.  The stomach is physiologically distended. There are no dilated or thickened bowel loops. The appendix is not definitively visualized. There is a moderate volume of colonic stool. No free air, free fluid, or intra-abdominal fluid collection.  Abdominal aorta is normal in caliber mild atherosclerosis. No retroperitoneal adenopathy. Fat containing anterior abdominal wall hernias in the lower abdomen.  Within the pelvis the urinary bladder is completely decompressed not well evaluated. The uterus and adnexa are normal for age. There is no pelvic free fluid. No pelvic adenopathy.  There are no acute or suspicious osseous abnormalities. Multilevel degenerative change with degenerative disc disease and facet arthropathy.  IMPRESSION: 1. Findings consistent with right pyelonephritis. Extrarenal pelvis configuration of the right kidney appear similar to prior exam. There is no urolithiasis to suggest obstructive uropathy. 2. Postcholecystectomy. Unchanged anterior abdominal wall fat containing ventral hernias. Mild hepatomegaly, suspect hepatic steatosis.   Electronically Signed   By: Jeb Levering M.D.   On: 06/23/2014 22:04      Microbiology: Recent Results (from the past 240 hour(s))  Blood culture (routine x 2)     Status: None (Preliminary result)   Collection Time: 06/23/14 10:35 PM  Result Value Ref Range Status   Specimen Description BLOOD RIGHT ANTECUBITAL  Final   Special Requests   Final    BOTTLES DRAWN AEROBIC AND ANAEROBIC AER 5cc ANA 5cc   Culture   Final           BLOOD CULTURE RECEIVED NO GROWTH TO DATE CULTURE WILL BE HELD FOR 5 DAYS BEFORE ISSUING A FINAL NEGATIVE REPORT Performed at [facility]    Report Status PENDING  Incomplete  Blood culture (routine x 2)     Status: None (Preliminary result)   Collection Time: 06/23/14 10:40 PM  Result Value Ref Range Status   Specimen Description BLOOD RIGHT ARM  Final   Special Requests   Final    BOTTLES DRAWN AEROBIC AND ANAEROBIC AER 5cc ANA 5cc   Culture   Final           BLOOD CULTURE RECEIVED NO GROWTH TO DATE CULTURE WILL BE HELD FOR 5 DAYS BEFORE ISSUING A FINAL NEGATIVE REPORT Performed at [facility]    Report Status PENDING  Incomplete     Labs: Results for orders placed or performed during the hospital encounter of 06/23/14 (from the past  48 hour(s))  Urinalysis, Routine w reflex microscopic     Status: Abnormal   Collection Time: 06/23/14  7:50 PM  Result Value Ref Range   Color, Urine YELLOW YELLOW   APPearance CLOUDY (A) CLEAR   Specific Gravity, Urine 1.015 1.005 - 1.030   pH 6.5 5.0 - 8.0   Glucose, UA >1000 (A) NEGATIVE mg/dL   Hgb urine dipstick SMALL (A) NEGATIVE   Bilirubin Urine NEGATIVE NEGATIVE   Ketones, ur NEGATIVE NEGATIVE mg/dL   Protein, ur NEGATIVE NEGATIVE mg/dL   Urobilinogen, UA 1.0 0.0 - 1.0 mg/dL   Nitrite NEGATIVE NEGATIVE   Leukocytes, UA LARGE (A) NEGATIVE  Urine microscopic-add on     Status: Abnormal   Collection Time: 06/23/14  7:50 PM  Result Value Ref Range   Squamous Epithelial / LPF FEW (A) RARE   WBC, UA TOO NUMEROUS TO COUNT <3 WBC/hpf   RBC / HPF 3-6 <3 RBC/hpf   Bacteria, UA MANY (A) RARE  Comprehensive metabolic panel     Status: Abnormal   Collection Time: 06/23/14  8:26 PM  Result Value Ref Range   Sodium 135 135 - 145 mmol/L   Potassium 3.4 (L) 3.5 - 5.1 mmol/L   Chloride 99 96 - 112 mmol/L   CO2 26 19 - 32 mmol/L   Glucose, Bld 268 (H) 70 - 99 mg/dL   BUN 13 6 - 23 mg/dL   Creatinine, Ser 0.74 0.50 - 1.10 mg/dL   Calcium 9.3 8.4 - 10.5 mg/dL   Total Protein 8.0 6.0 - 8.3 g/dL   Albumin 3.8 3.5 - 5.2 g/dL   AST 17 0 - 37 U/L   ALT 18 0 - 35  U/L   Alkaline Phosphatase 70 39 - 117 U/L   Total Bilirubin 0.1 (L) 0.3 - 1.2 mg/dL   GFR calc non Af Amer >90 >90 mL/min   GFR calc Af Amer >90 >90 mL/min    Comment: (NOTE) The eGFR has been calculated using the CKD EPI equation. This calculation has not been validated in all clinical situations. eGFR's persistently <90 mL/min signify possible Chronic Kidney Disease.    Anion gap 10 5 - 15  CBC with Differential     Status: Abnormal   Collection Time: 06/23/14  8:26 PM  Result Value Ref Range   WBC 13.4 (H) 4.0 - 10.5 K/uL   RBC 4.90 3.87 - 5.11 MIL/uL   Hemoglobin 13.7 12.0 - 15.0 g/dL   HCT 41.9 36.0 - 46.0 %   MCV 85.5 78.0 - 100.0 fL   MCH 28.0 26.0 - 34.0 pg   MCHC 32.7 30.0 - 36.0 g/dL   RDW 13.1 11.5 - 15.5 %   Platelets 359 150 - 400 K/uL   Neutrophils Relative % 63 43 - 77 %   Neutro Abs 8.5 (H) 1.7 - 7.7 K/uL   Lymphocytes Relative 28 12 - 46 %   Lymphs Abs 3.7 0.7 - 4.0 K/uL   Monocytes Relative 8 3 - 12 %   Monocytes Absolute 1.0 0.1 - 1.0 K/uL   Eosinophils Relative 1 0 - 5 %   Eosinophils Absolute 0.1 0.0 - 0.7 K/uL   Basophils Relative 0 0 - 1 %   Basophils Absolute 0.0 0.0 - 0.1 K/uL  Lipase, blood     Status: None   Collection Time: 06/23/14  8:26 PM  Result Value Ref Range   Lipase 20 11 - 59 U/L  Blood culture (routine x 2)  Status: None (Preliminary result)   Collection Time: 06/23/14 10:35 PM  Result Value Ref Range   Specimen Description BLOOD RIGHT ANTECUBITAL    Special Requests      BOTTLES DRAWN AEROBIC AND ANAEROBIC AER 5cc ANA 5cc   Culture             BLOOD CULTURE RECEIVED NO GROWTH TO DATE CULTURE WILL BE HELD FOR 5 DAYS BEFORE ISSUING A FINAL NEGATIVE REPORT Performed at [facility]    Report Status PENDING   Blood culture (routine x 2)     Status: None (Preliminary result)   Collection Time: 06/23/14 10:40 PM  Result Value Ref Range   Specimen Description BLOOD RIGHT ARM    Special Requests      BOTTLES DRAWN  AEROBIC AND ANAEROBIC AER 5cc ANA 5cc   Culture             BLOOD CULTURE RECEIVED NO GROWTH TO DATE CULTURE WILL BE HELD FOR 5 DAYS BEFORE ISSUING A FINAL NEGATIVE REPORT Performed at [facility]    Report Status PENDING   I-Stat CG4 Lactic Acid, ED     Status: None   Collection Time: 06/23/14 10:45 PM  Result Value Ref Range   Lactic Acid, Venous 1.17 0.5 - 2.0 mmol/L  POC CBG, ED     Status: Abnormal   Collection Time: 06/23/14 11:17 PM  Result Value Ref Range   Glucose-Capillary 204 (H) 70 - 99 mg/dL  Glucose, capillary     Status: Abnormal   Collection Time: 06/24/14  1:54 AM  Result Value Ref Range   Glucose-Capillary 206 (H) 70 - 99 mg/dL  Comprehensive metabolic panel     Status: Abnormal   Collection Time: 06/24/14  6:16 AM  Result Value Ref Range   Sodium 136 135 - 145 mmol/L   Potassium 3.5 3.5 - 5.1 mmol/L   Chloride 101 96 - 112 mmol/L   CO2 24 19 - 32 mmol/L   Glucose, Bld 216 (H) 70 - 99 mg/dL   BUN 9 6 - 23 mg/dL   Creatinine, Ser 0.75 0.50 - 1.10 mg/dL   Calcium 8.5 8.4 - 10.5 mg/dL   Total Protein 6.9 6.0 - 8.3 g/dL   Albumin 3.3 (L) 3.5 - 5.2 g/dL   AST 139 (H) 0 - 37 U/L   ALT 86 (H) 0 - 35 U/L   Alkaline Phosphatase 158 (H) 39 - 117 U/L   Total Bilirubin 0.6 0.3 - 1.2 mg/dL   GFR calc non Af Amer >90 >90 mL/min   GFR calc Af Amer >90 >90 mL/min    Comment: (NOTE) The eGFR has been calculated using the CKD EPI equation. This calculation has not been validated in all clinical situations. eGFR's persistently <90 mL/min signify possible Chronic Kidney Disease.    Anion gap 11 5 - 15  CBC     Status: None   Collection Time: 06/24/14  6:16 AM  Result Value Ref Range   WBC 9.9 4.0 - 10.5 K/uL   RBC 4.54 3.87 - 5.11 MIL/uL   Hemoglobin 12.6 12.0 - 15.0 g/dL   HCT 39.5 36.0 - 46.0 %   MCV 87.0 78.0 - 100.0 fL   MCH 27.8 26.0 - 34.0 pg   MCHC 31.9 30.0 - 36.0 g/dL   RDW 13.2 11.5 - 15.5 %   Platelets 321 150 - 400 K/uL  Glucose, capillary      Status: Abnormal   Collection Time: 06/24/14  7:51 AM  Result Value Ref Range   Glucose-Capillary 206 (H) 70 - 99 mg/dL  Glucose, capillary     Status: Abnormal   Collection Time: 06/24/14 12:00 PM  Result Value Ref Range   Glucose-Capillary 173 (H) 70 - 99 mg/dL  Glucose, capillary     Status: Abnormal   Collection Time: 06/24/14  5:10 PM  Result Value Ref Range   Glucose-Capillary 162 (H) 70 - 99 mg/dL  Glucose, capillary     Status: Abnormal   Collection Time: 06/24/14  9:52 PM  Result Value Ref Range   Glucose-Capillary 169 (H) 70 - 99 mg/dL  Comprehensive metabolic panel     Status: Abnormal   Collection Time: 06/25/14  5:35 AM  Result Value Ref Range   Sodium 141 135 - 145 mmol/L   Potassium 3.8 3.5 - 5.1 mmol/L   Chloride 104 96 - 112 mmol/L   CO2 31 19 - 32 mmol/L   Glucose, Bld 152 (H) 70 - 99 mg/dL   BUN 9 6 - 23 mg/dL   Creatinine, Ser 0.74 0.50 - 1.10 mg/dL   Calcium 8.5 8.4 - 10.5 mg/dL   Total Protein 5.8 (L) 6.0 - 8.3 g/dL   Albumin 2.9 (L) 3.5 - 5.2 g/dL   AST 39 (H) 0 - 37 U/L   ALT 49 (H) 0 - 35 U/L   Alkaline Phosphatase 117 39 - 117 U/L   Total Bilirubin 0.3 0.3 - 1.2 mg/dL   GFR calc non Af Amer >90 >90 mL/min   GFR calc Af Amer >90 >90 mL/min    Comment: (NOTE) The eGFR has been calculated using the CKD EPI equation. This calculation has not been validated in all clinical situations. eGFR's persistently <90 mL/min signify possible Chronic Kidney Disease.    Anion gap 6 5 - 15  CBC     Status: Abnormal   Collection Time: 06/25/14  5:35 AM  Result Value Ref Range   WBC 7.7 4.0 - 10.5 K/uL   RBC 3.96 3.87 - 5.11 MIL/uL   Hemoglobin 11.1 (L) 12.0 - 15.0 g/dL   HCT 34.9 (L) 36.0 - 46.0 %   MCV 88.1 78.0 - 100.0 fL   MCH 28.0 26.0 - 34.0 pg   MCHC 31.8 30.0 - 36.0 g/dL   RDW 13.2 11.5 - 15.5 %   Platelets 304 150 - 400 K/uL  Glucose, capillary     Status: Abnormal   Collection Time: 06/25/14  8:02 AM  Result Value Ref Range   Glucose-Capillary  120 (H) 70 - 99 mg/dL     HPI :IRETTA MANGRUM is a 59 y.o. female with Past medical history of diabetic neuropathy, type diabetes mellitus, hypertension, migraine, obesity, asthma with chronic cough.. The patient is presenting with complaints of right-sided flank pain. She also had some suprapubic pain. This also has been ongoing since last 3 days. She denies any burning urination. She complains of nausea as well as vomiting today. Therefore she came to the hospital. She denies any chest pain shortness of breath fever or chills. She has some cough which is chronic and unchanged. She complains of some leg swelling which is unchanged as well. She denies any focal deficit. She has been compliant with all her medications. She reportedly mentions she's taking trimethoprim daily.  HOSPITAL COURSE:    Right-sided pyelonephritis  no growth on urine culture, blood culture , initially started on Rocephin , patient has been eating all of her meals Will switch  Rocephin to Keflex, treated for a total of 10 days post discharge for complicated UTI/pyelonephritis   diabetes mellitus uncontrolled  Resumed dose of Lantus, Humulin R, glyburide, metformin  Diabetic polyneuropathy Continue gabapentin   hypertension Continue lisinopril  Hold HCTZ until seen by PCP next week  chronic back pain Continue with Flexeril  anxiety resumed Xanax  History of migraine continue Topamax  Gastroesophageal reflux disease continue PPI    Discharge Exam   Blood pressure 105/56, pulse 77, temperature 98.2 F (36.8 C), temperature source Oral, resp. rate 16, height 5' 2"  (1.575 m), weight 97.569 kg (215 lb 1.6 oz), SpO2 88 %. General: Alert, Awake and Oriented to Time, Place and Person. Appear in mild distress Eyes: PERRL ENT: Oral Mucosa clear moist. Neck: no JVD Cardiovascular: S1 and S2 Present, no Murmur, Peripheral Pulses Present Respiratory: Bilateral Air entry equal and Decreased, Clear to  Auscultation, noCrackles, no wheezes Abdomen: Bowel Sound presnt, Soft and minimally tender Skin: no Rash Extremities: Trace Pedal edema, non calf tenderness        Signed: Ulysses Alper 06/25/2014, 10:57 AM

## 2014-06-25 NOTE — Progress Notes (Signed)
06/25/14 Spoke with patient about discharge, I explained that PT recommended HHPT but that Medicaid does not cover HHPT for her diagnosis.She stated that she understood and does not wish to pay out of pocket. She would like a 3N1 which was also recommmended. I contacted Stephanie at [facility] and requested 3N1 be delivered to patient's room.  Patient stated that her PCP is Dr. Lerry Linerwight Williams in [place], office # [phone].CM will continue to follow for discharge needs.

## 2014-06-25 NOTE — Evaluation (Signed)
Physical Therapy Evaluation Patient Details Name: Anna [name] MRN: [id number] DOB: Jul 14, 1955 Today's Date: 06/25/2014   History of Present Illness  Anna [name] is a 59 y.o. female with Past medical history of diabetic neuropathy, type diabetes mellitus, hypertension, migraine, obesity, asthma with chronic cough..  Admitted with pyelonephritis.  Clinical Impression  Patient presents with decreased strength and endurance with mobility limiting independence and safety.  She will benefit from continued skilled PT at d/c with HHPT and from 3:1 for safety with toileting at night.    Follow Up Recommendations Home health PT    Equipment Recommendations  3in1 (PT)    Recommendations for Other Services       Precautions / Restrictions Precautions Precautions: Fall      Mobility  Bed Mobility               General bed mobility comments: NT pt up in chair  Transfers Overall transfer level: Modified independent               General transfer comment: with UE support on armrests  Ambulation/Gait Ambulation/Gait assistance: Supervision;Min guard Ambulation Distance (Feet): 200 Feet Assistive device: Straight cane Gait Pattern/deviations: Step-through pattern;Decreased stride length     General Gait Details: slow pace, stopping at times to "sway" to help with back pain; frequent use also of wall rail due to back pain; educated and demo how to use cane in right hand to coordinate with left LE  Stairs Stairs: Yes Stairs assistance: Min guard Stair Management: One rail Left;With cane;Forwards;Step to pattern Number of Stairs: 4 (x2) General stair comments: Leads up with right and down with left due to pain on left knee; cues also for cane placement  Wheelchair Mobility    Modified Rankin (Stroke Patients Only)       Balance Overall balance assessment: Needs assistance           Standing balance-Leahy Scale: Fair Standing balance comment: UE  support for gait due to weakness in trunk with h/o back pain                             Pertinent Vitals/Pain Pain Assessment: Faces Faces Pain Scale: Hurts little more Pain Location: left knee Pain Descriptors / Indicators: Aching Pain Intervention(s): Monitored during session    Home Living Family/patient expects to be discharged to:: Private residence Living Arrangements: Spouse/significant other;Children Available Help at Discharge: Family Type of Home: Mobile home Home Access: Stairs to enter Entrance Stairs-Rails: Left Entrance Stairs-Number of Steps: 3 Home Layout: One level Home Equipment: Walker - 2 wheels;Cane - single point      Prior Function Level of Independence: Independent with assistive device(s)         Comments: inconsistent with reports; states she drives and cooks and cares for grandchildren, but sometimes has to have help to change her depends     Hand Dominance        Extremity/Trunk Assessment               Lower Extremity Assessment: LLE deficits/detail   LLE Deficits / Details: reports left LE weaker than right, not formally assessed, but functionally weaker at times needing both UE assist (but pt states more due to back pain)     Communication   Communication: No difficulties  Cognition Arousal/Alertness: Awake/alert Behavior During Therapy: WFL for tasks assessed/performed Overall Cognitive Status: Within Functional Limits for tasks assessed  General Comments General comments (skin integrity, edema, etc.): Patient also reports difficulty with getting to bathroom at night due to incontinence and feeling she has to rush so she doesn't "flood her bed," but staggers sometimes due to groggy and weak; discussed BSC    Exercises        Assessment/Plan    PT Assessment All further PT needs can be met in the next venue of care  PT Diagnosis Generalized weakness;Abnormality of gait   PT  Problem List Decreased strength;Decreased balance;Decreased knowledge of use of DME;Decreased activity tolerance  PT Treatment Interventions     PT Goals (Current goals can be found in the Care Plan section) Acute Rehab PT Goals PT Goal Formulation: All assessment and education complete, DC therapy    Frequency     Barriers to discharge        Co-evaluation               End of Session Equipment Utilized During Treatment: Gait belt Activity Tolerance: Patient tolerated treatment well Patient left: in chair;with call bell/phone within reach           Time: 1202-1235 PT Time Calculation (min) (ACUTE ONLY): 33 min   Charges:   PT Evaluation $Initial PT Evaluation Tier I: 1 Procedure PT Treatments $Gait Training: 8-22 mins   PT G Codes:        [name],[name] 06/28/14, 1:20 PM  [name], [name] 06/28/14

## 2014-06-25 NOTE — Progress Notes (Signed)
NURSING PROGRESS NOTE  Anna CarryDebbie C Stucker [id number] Discharge Data: 06/25/2014 5:19 PM Attending Provider: No att. providers found PCP:Pcp Not In System   Anna [name] to be D/C'd Home per MD order.    All IV's will be discontinued and monitored for bleeding.  All belongings will be returned to patient for patient to take home.  Last Documented Vital Signs:  Blood pressure 110/51, pulse 77, temperature 97.9 F (36.6 C), temperature source Oral, resp. rate 16, height 5\' 2"  (1.575 m), weight 97.569 kg (215 lb 1.6 oz), SpO2 100 %.  Leane PlattSpencer Lakhia Gengler RN, BS, BSN

## 2014-06-25 NOTE — Progress Notes (Signed)
Patient said her ride will not be here until later tonight.

## 2014-06-30 LAB — CULTURE, BLOOD (ROUTINE X 2)
Culture: NO GROWTH
Culture: NO GROWTH

## 2014-08-15 ENCOUNTER — Emergency Department (HOSPITAL_BASED_OUTPATIENT_CLINIC_OR_DEPARTMENT_OTHER)
Admission: EM | Admit: 2014-08-15 | Discharge: 2014-08-15 | Disposition: A | Discharge status: Home / Self Care | Payer: Medicaid Other | Attending: Emergency Medicine | Admitting: Emergency Medicine

## 2014-08-15 ENCOUNTER — Encounter (HOSPITAL_BASED_OUTPATIENT_CLINIC_OR_DEPARTMENT_OTHER): Payer: Self-pay | Admitting: *Deleted

## 2014-08-15 ENCOUNTER — Emergency Department (HOSPITAL_BASED_OUTPATIENT_CLINIC_OR_DEPARTMENT_OTHER): Payer: Medicaid Other

## 2014-08-15 DIAGNOSIS — Z87440 Personal history of urinary (tract) infections: Secondary | ICD-10-CM | POA: Diagnosis not present

## 2014-08-15 DIAGNOSIS — Y9389 Activity, other specified: Secondary | ICD-10-CM | POA: Diagnosis not present

## 2014-08-15 DIAGNOSIS — Z791 Long term (current) use of non-steroidal anti-inflammatories (NSAID): Secondary | ICD-10-CM | POA: Insufficient documentation

## 2014-08-15 DIAGNOSIS — M1990 Unspecified osteoarthritis, unspecified site: Secondary | ICD-10-CM | POA: Insufficient documentation

## 2014-08-15 DIAGNOSIS — Z9889 Other specified postprocedural states: Secondary | ICD-10-CM | POA: Diagnosis not present

## 2014-08-15 DIAGNOSIS — W208XXA Other cause of strike by thrown, projected or falling object, initial encounter: Secondary | ICD-10-CM | POA: Diagnosis not present

## 2014-08-15 DIAGNOSIS — Y9289 Other specified places as the place of occurrence of the external cause: Secondary | ICD-10-CM | POA: Diagnosis not present

## 2014-08-15 DIAGNOSIS — Z79899 Other long term (current) drug therapy: Secondary | ICD-10-CM | POA: Diagnosis not present

## 2014-08-15 DIAGNOSIS — E1140 Type 2 diabetes mellitus with diabetic neuropathy, unspecified: Secondary | ICD-10-CM | POA: Diagnosis not present

## 2014-08-15 DIAGNOSIS — I10 Essential (primary) hypertension: Secondary | ICD-10-CM | POA: Insufficient documentation

## 2014-08-15 DIAGNOSIS — Z794 Long term (current) use of insulin: Secondary | ICD-10-CM | POA: Diagnosis not present

## 2014-08-15 DIAGNOSIS — Z8619 Personal history of other infectious and parasitic diseases: Secondary | ICD-10-CM | POA: Insufficient documentation

## 2014-08-15 DIAGNOSIS — J45909 Unspecified asthma, uncomplicated: Secondary | ICD-10-CM | POA: Diagnosis not present

## 2014-08-15 DIAGNOSIS — E11319 Type 2 diabetes mellitus with unspecified diabetic retinopathy without macular edema: Secondary | ICD-10-CM | POA: Insufficient documentation

## 2014-08-15 DIAGNOSIS — Y998 Other external cause status: Secondary | ICD-10-CM | POA: Insufficient documentation

## 2014-08-15 DIAGNOSIS — S9032XA Contusion of left foot, initial encounter: Secondary | ICD-10-CM | POA: Insufficient documentation

## 2014-08-15 DIAGNOSIS — E669 Obesity, unspecified: Secondary | ICD-10-CM | POA: Diagnosis not present

## 2014-08-15 DIAGNOSIS — S99922A Unspecified injury of left foot, initial encounter: Secondary | ICD-10-CM | POA: Diagnosis present

## 2014-08-15 IMAGING — DX DG FOOT COMPLETE 3+V*L*
3 series · 3 of 3 positions shown · non-contrast
Comparison: None.

CLINICAL DATA: Patient dropped pan on foot.  Diffuse foot pain.

EXAM:
LEFT FOOT - COMPLETE 3+ VIEW

[foot ap]
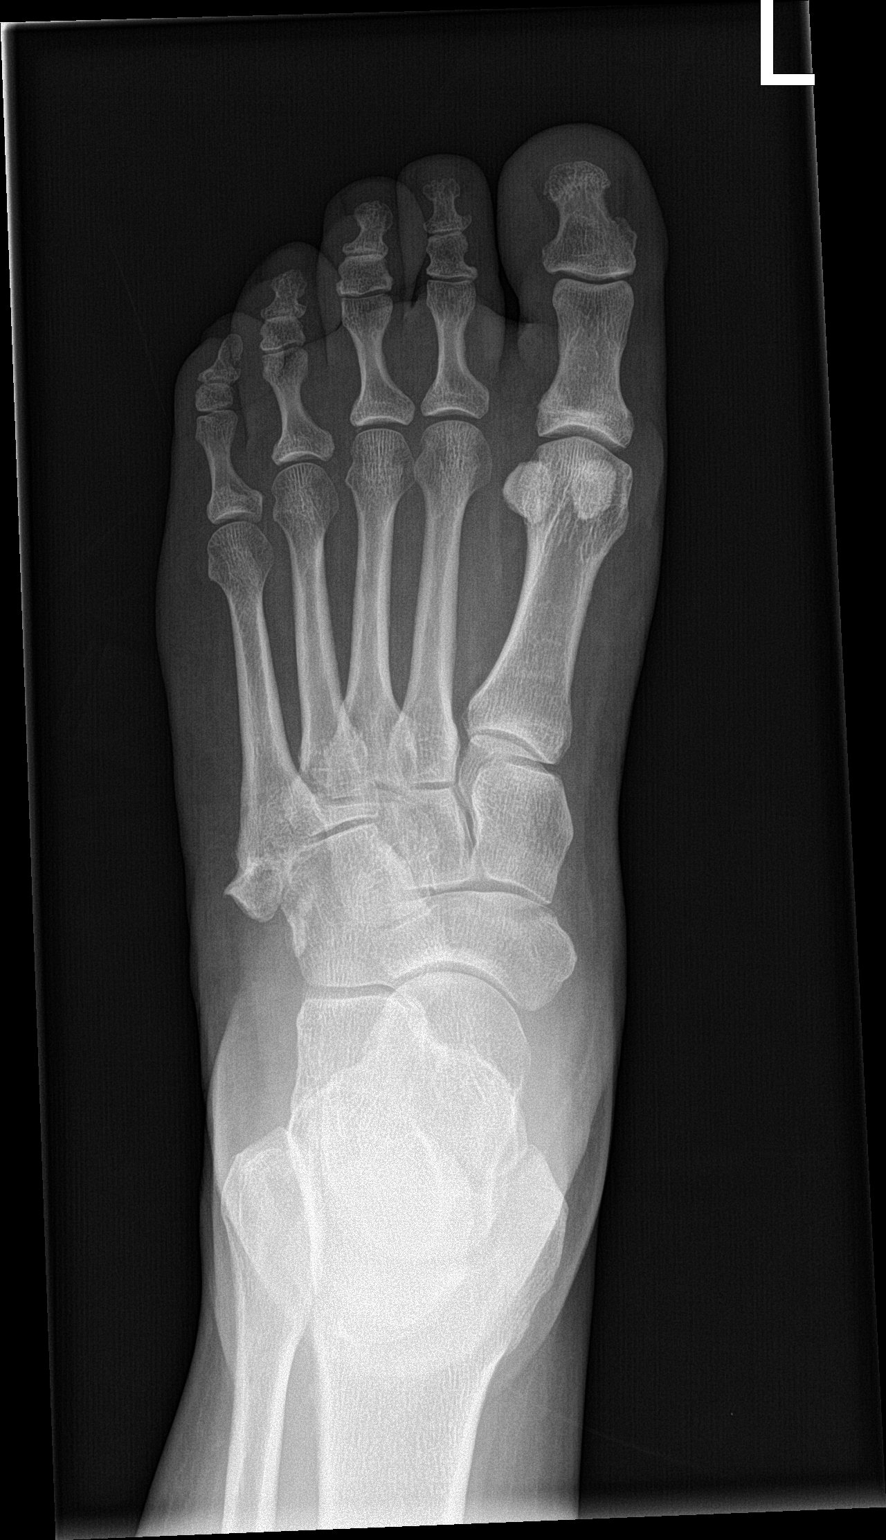

[foot obl]
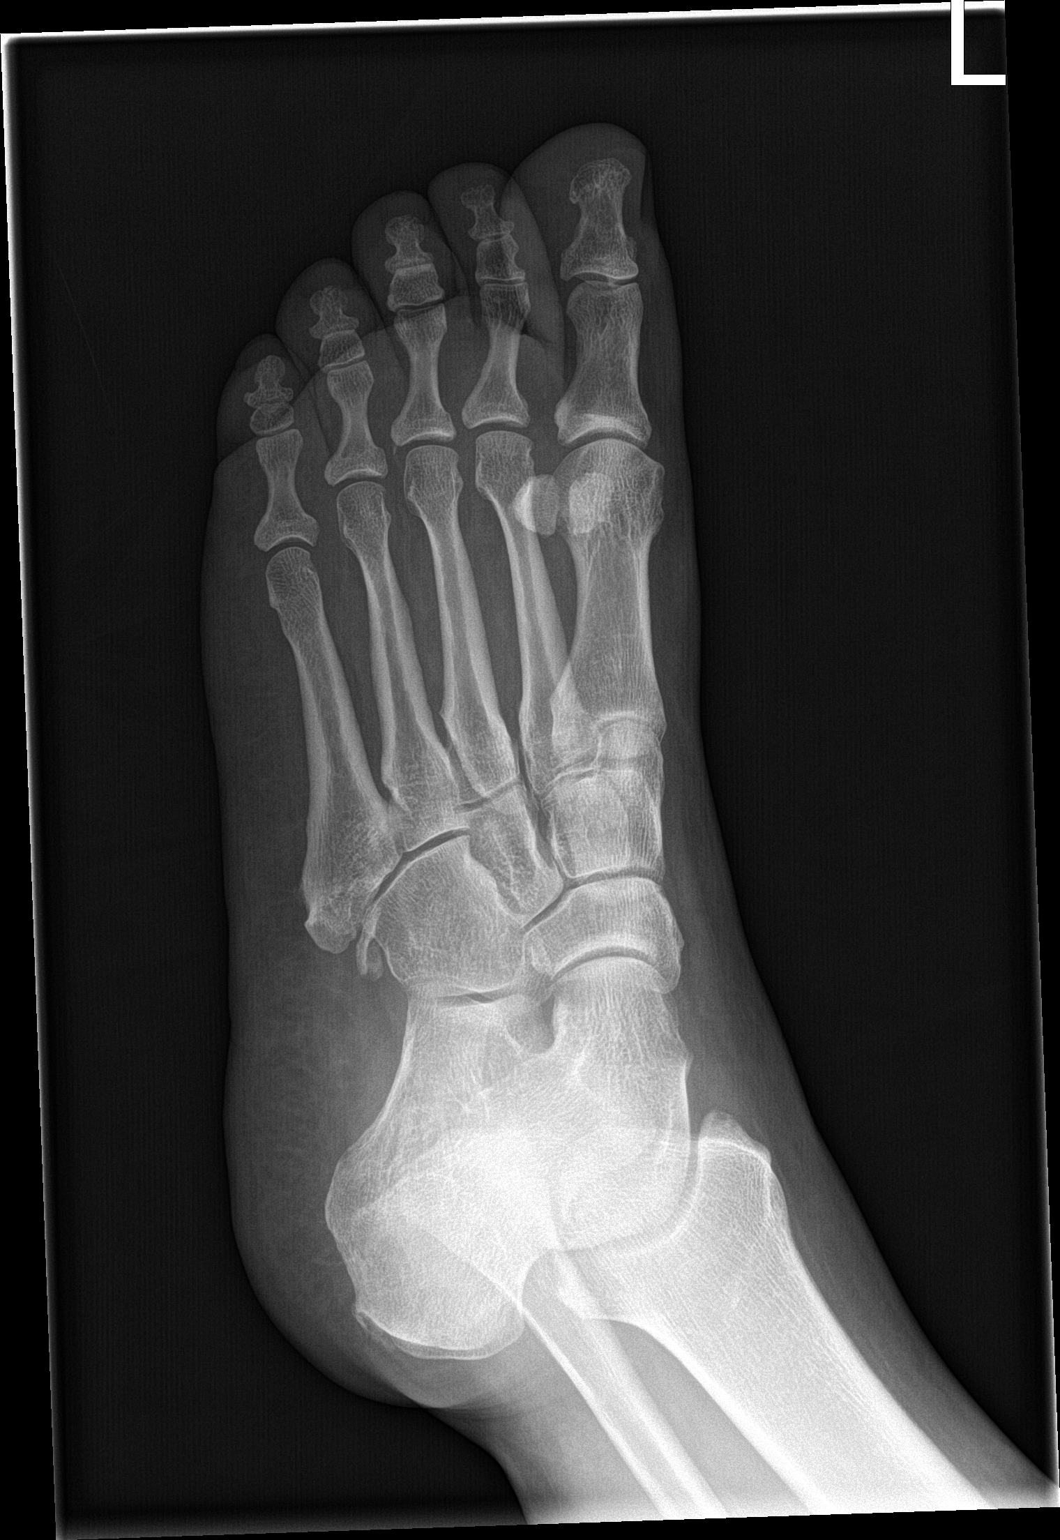

[foot lat]
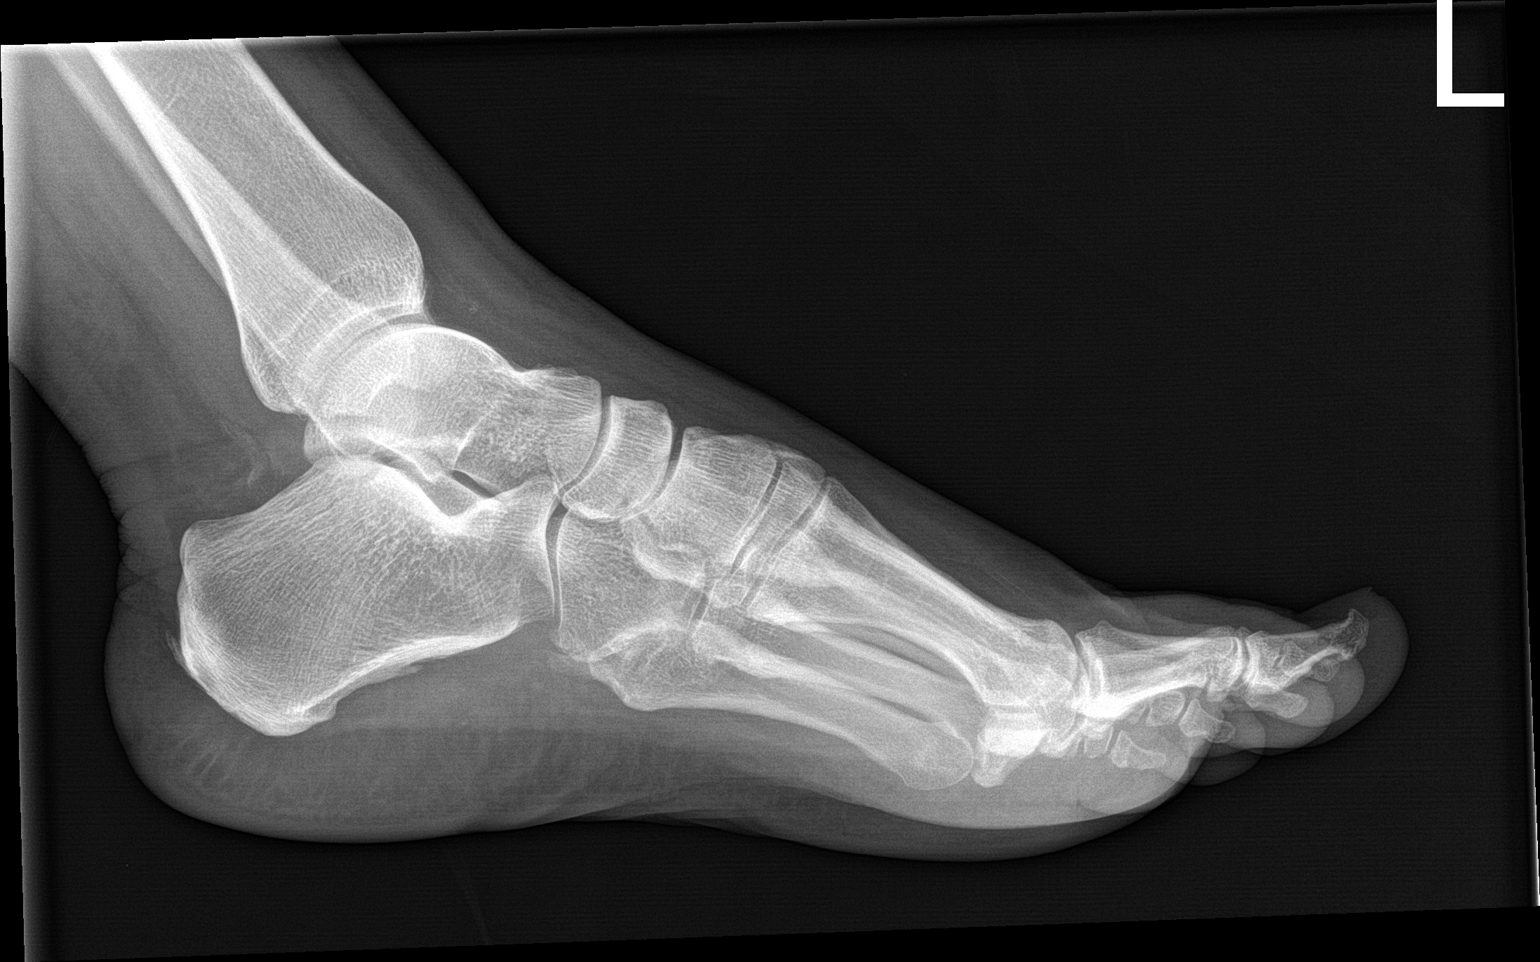

[3 of 3 positions shown; findings below may reference images not displayed]

FINDINGS: Normal anatomic alignment. No definite evidence for acute fracture
or dislocation. Irregularity about the base of the fifth metatarsal
likely sequelae of prior trauma. Adjacent ossification near the
lateral aspect of the cuboid favored represent an os perineum.
IMPRESSION: No definite evidence for acute fracture or dislocation.

Irregularity at the base of the fifth metatarsal favored to
represent sequelae of prior trauma.

Os perineum.

## 2014-08-15 MED ORDER — OXYCODONE-ACETAMINOPHEN 5-325 MG PO TABS: ORAL_TABLET | ORAL | Status: DC

## 2014-08-15 MED ORDER — OXYCODONE-ACETAMINOPHEN 5-325 MG PO TABS
1.0000 | ORAL_TABLET | Freq: Once | ORAL | Status: AC
  Administered 2014-08-15: 1 via ORAL
  Filled 2014-08-15: qty 1

## 2014-08-15 NOTE — ED Provider Notes (Signed)
CSN: [id number]     Arrival date & time 08/15/14  1856 History   First MD Initiated Contact with Patient 08/15/14 1957     Chief Complaint  Patient presents with  . Foot Injury     (Consider location/radiation/quality/duration/timing/severity/associated sxs/prior Treatment) HPI   Anna [name] is a 59 y.o. female complaining of Severe, 9 out of 10 pain to distal left forefoot after she dropped a stack of 5 cast iron skillets onto the foot at approximately 10 inches above the level of the foot this morning. Patient has been ambulatory with difficulty, she is using a cane because the pain is so severe, no pain medication taken prior to arrival. Pain is described as throbbing, she states that the pain radiates up the dorsum of the leg when she dorsiflexes.  Past Medical History  Diagnosis Date  . Bronchitis   . Diabetic neuropathy   . Diabetic retinopathy   . Arthritis   . Hypertension   . Hyperlipemia   . Obesity   . Diabetes mellitus   . Migraine   . Back pain   . Bursitis   . Sepsis due to urinary tract infection   . Asthma    Past Surgical History  Procedure Laterality Date  . Cesarean section      x3  . Cholecystectomy    . Eye surgery Left    No family history on file. History  Substance Use Topics  . Smoking status: Never Smoker   . Smokeless tobacco: Never Used  . Alcohol Use: No   OB History    No data available     Review of Systems  10 systems reviewed and found to be negative, except as noted in the HPI.   Allergies  Morphine and related; Ciprofloxacin; Levaquin; Soma; and Lasix  Home Medications   Prior to Admission medications   Medication Sig Start Date End Date Taking? Authorizing Provider  cyclobenzaprine (FLEXERIL) 5 MG tablet Take 5 mg by mouth 2 (two) times daily.   Yes Historical Provider, MD  FLUoxetine (PROZAC) 20 MG capsule Take 20 mg by mouth daily.   Yes Historical Provider, MD  gabapentin (NEURONTIN) 300 MG capsule Take 300 mg  by mouth 3 (three) times daily.   Yes Historical Provider, MD  hydrochlorothiazide (HYDRODIURIL) 25 MG tablet Take 1 tablet (25 mg total) by mouth daily. 06/29/14  Yes Richarda Overlie, MD  insulin glargine (LANTUS) 100 UNIT/ML injection Inject 100 Units into the skin at bedtime.    Yes Historical Provider, MD  insulin lispro (HUMALOG) 100 UNIT/ML injection Inject 2-15 Units into the skin daily as needed for high blood sugar. Use per sliding scale when sugar is high but not over 400   Yes Historical Provider, MD  insulin regular (NOVOLIN R,HUMULIN R) 100 units/mL injection Inject 20-35 Units into the skin 3 (three) times daily as needed for high blood sugar. Per sliding scale   Yes Historical Provider, MD  ipratropium-albuterol (DUONEB) 0.5-2.5 (3) MG/3ML SOLN Take 3 mLs by nebulization every 6 (six) hours as needed.   Yes Historical Provider, MD  lisinopril (PRINIVIL,ZESTRIL) 5 MG tablet Take 5 mg by mouth daily.   Yes Historical Provider, MD  metFORMIN (GLUCOPHAGE) 850 MG tablet Take 850 mg by mouth 2 (two) times daily with a meal.   Yes Historical Provider, MD  naproxen (NAPROSYN) 500 MG tablet Take 500 mg by mouth 2 (two) times daily with a meal.   Yes Historical Provider, MD  pantoprazole (PROTONIX) 40  MG tablet Take 40 mg by mouth daily.   Yes Historical Provider, MD  Potassium Chloride Crys CR (K-DUR PO) Take 20 mEq by mouth daily.    Yes Historical Provider, MD  promethazine (PHENERGAN) 12.5 MG tablet Take 1 tablet (12.5 mg total) by mouth every 6 (six) hours as needed for nausea or vomiting. 06/25/14  Yes Richarda Overlie, MD  topiramate (TOPAMAX) 25 MG tablet Take 25 mg by mouth daily.   Yes Historical Provider, MD  TRAMADOL HCL PO Take 25 mg by mouth 3 (three) times daily as needed.    Yes Historical Provider, MD  ALPRAZolam Prudy Feeler) 1 MG tablet Take 1 mg by mouth 3 (three) times daily as needed for anxiety.    Historical Provider, MD  HYDROcodone-acetaminophen (NORCO) 10-325 MG per tablet Take 1 tablet  by mouth every 6 (six) hours as needed. 06/25/14   Richarda Overlie, MD  metFORMIN (GLUMETZA) 1000 MG (MOD) 24 hr tablet Take 1,000 mg by mouth 3 (three) times daily.    Historical Provider, MD  oxyCODONE-acetaminophen (PERCOCET/ROXICET) 5-325 MG per tablet 1 to 2 tabs PO q6hrs  PRN for pain 08/15/14   Joni Reining Jason Frisbee, PA-C   BP 151/68 mmHg  Pulse 88  Temp(Src) 98.2 F (36.8 C) (Oral)  Resp 20  Ht  (1.575 m)  Wt 213 lb (96.616 kg)  BMI 38.95 kg/m2  SpO2 100% Physical Exam  Constitutional: She is oriented to person, place, and time. She appears well-developed and well-nourished. No distress.  HENT:  Head: Normocephalic.  Eyes: Conjunctivae and EOM are normal.  Cardiovascular: Normal rate.   Pulmonary/Chest: Effort normal. No stridor.  Abdominal: Soft.  Musculoskeletal: Normal range of motion. She exhibits tenderness.       Feet:  No overlying skin changes or abrasions, ecchymoses. Patient has 2+ DP and PT pulses. Patient is tender to palpation along the MTP at the fourth and fifth digits. Excellent range of motion to toes. No tenderness palpation along the lateral malleoli.  Neurological: She is alert and oriented to person, place, and time.  Psychiatric: She has a normal mood and affect.  Nursing note and vitals reviewed.   ED Course  Procedures (including critical care time) Labs Review Labs Reviewed - No data to display  Imaging Review Dg Foot Complete Left  08/15/2014   CLINICAL DATA:  Patient dropped pan on foot.  Diffuse foot pain.  EXAM: LEFT FOOT - COMPLETE 3+ VIEW  COMPARISON:  None.  FINDINGS: Normal anatomic alignment. No definite evidence for acute fracture or dislocation. Irregularity about the base of the fifth metatarsal likely sequelae of prior trauma. Adjacent ossification near the lateral aspect of the cuboid favored represent an os perineum.  IMPRESSION: No definite evidence for acute fracture or dislocation.  Irregularity at the base of the fifth metatarsal  favored to represent sequelae of prior trauma.  Os perineum.   Electronically Signed   By: Annia Belt M.D.   On: 08/15/2014 19:43     EKG Interpretation None      MDM   Final diagnoses:  Foot contusion, left, initial encounter    Filed Vitals:   08/15/14 1909  BP: 151/68  Pulse: 88  Temp: 98.2 F (36.8 C)  TempSrc: Oral  Resp: 20  Height:  (1.575 m)  Weight: 213 lb (96.616 kg)  SpO2: 100%    Medications  oxyCODONE-acetaminophen (PERCOCET/ROXICET) 5-325 MG per tablet 1 tablet (not administered)    Anna [name] is a pleasant 59 y.o. female  presenting with left foot pain after she dropped a stack of Iron pans onset this morning. No abrasions or lacerations, patient is neurovascularly intact. X-ray is negative for acute bony abnormality however they do see an abnormality at the base of the fifth metatarsal, this is read as possible remote injury, patient does not remember any trauma to the affected foot. Patient has no tenderness palpation in the area of the abnormality, think this is artifact. Patient will be given postop boot, crutches, sports medicine referral.  Evaluation does not show pathology that would require ongoing emergent intervention or inpatient treatment. Pt is hemodynamically stable and mentating appropriately. Discussed findings and plan with patient/guardian, who agrees with care plan. All questions answered. Return precautions discussed and outpatient follow up given.   New Prescriptions   OXYCODONE-ACETAMINOPHEN (PERCOCET/ROXICET) 5-325 MG PER TABLET    1 to 2 tabs PO q6hrs  PRN for pain         Dalina Samara PisWynetta Emeryciotta, PA-C 08/15/14 2028  Zadie Rhineonald Wickline, MD 08/15/14 2111

## 2014-08-15 NOTE — ED Notes (Addendum)
Care assumed at time of d/c. Pt seen by EDP prior to RN assessment, see MD notes, orders received to d/c. Pt d/c'd by other RN. Pt not seen by this RN.

## 2014-08-15 NOTE — ED Notes (Signed)
Reports she dropped a stack of cast iron skillets on left foot this morning- ambulatory with limp- c/o pain in all toes

## 2014-08-15 NOTE — Discharge Instructions (Signed)
Rest, Ice intermittently (in the first 24-48 hours), Gentle compression with an Ace wrap, and elevate (Limb above the level of the heart)   Take percocet for breakthrough pain, do not drink alcohol, drive, care for children or do other critical tasks while taking percocet.  Please follow with your primary care doctor in the next 2 days for a check-up. They must obtain records for further management.   Do not hesitate to return to the Emergency Department for any new, worsening or concerning symptoms.    Contusion A contusion is a deep bruise. Contusions happen when an injury causes bleeding under the skin. Signs of bruising include pain, puffiness (swelling), and discolored skin. The contusion may turn blue, purple, or yellow. HOME CARE   Put ice on the injured area.  Put ice in a plastic bag.  Place a towel between your skin and the bag.  Leave the ice on for 15-20 minutes, 03-04 times a day.  Only take medicine as told by your doctor.  Rest the injured area.  If possible, raise (elevate) the injured area to lessen puffiness. GET HELP RIGHT AWAY IF:   You have more bruising or puffiness.  You have pain that is getting worse.  Your puffiness or pain is not helped by medicine. MAKE SURE YOU:   Understand these instructions.  Will watch your condition.  Will get help right away if you are not doing well or get worse. Document Released: 08/29/2007 Document Revised: 06/04/2011 Document Reviewed: 01/15/2011 [facility] Patient Information 2015 [place], [place]. This information is not intended to replace advice given to you by your health care provider. Make sure you discuss any questions you have with your health care provider.

## 2014-08-24 ENCOUNTER — Ambulatory Visit: Payer: Medicaid Other | Admitting: Family Medicine

## 2014-08-25 ENCOUNTER — Ambulatory Visit: Payer: Medicaid Other | Admitting: Family Medicine

## 2014-09-10 ENCOUNTER — Ambulatory Visit: Payer: Medicaid Other | Admitting: Family Medicine

## 2015-11-01 DIAGNOSIS — S92353K Displaced fracture of fifth metatarsal bone, unspecified foot, subsequent encounter for fracture with nonunion: Secondary | ICD-10-CM

## 2015-11-01 HISTORY — DX: Displaced fracture of fifth metatarsal bone, unspecified foot, subsequent encounter for fracture with nonunion: S92.353K

## 2015-12-09 DIAGNOSIS — H47012 Ischemic optic neuropathy, left eye: Secondary | ICD-10-CM | POA: Insufficient documentation

## 2015-12-09 DIAGNOSIS — H5462 Unqualified visual loss, left eye, normal vision right eye: Secondary | ICD-10-CM | POA: Insufficient documentation

## 2016-09-12 DIAGNOSIS — R1310 Dysphagia, unspecified: Secondary | ICD-10-CM | POA: Insufficient documentation

## 2016-09-12 DIAGNOSIS — Z1211 Encounter for screening for malignant neoplasm of colon: Secondary | ICD-10-CM | POA: Insufficient documentation

## 2017-01-14 DIAGNOSIS — Z86010 Personal history of colonic polyps: Secondary | ICD-10-CM | POA: Insufficient documentation

## 2017-12-12 DIAGNOSIS — K219 Gastro-esophageal reflux disease without esophagitis: Secondary | ICD-10-CM | POA: Insufficient documentation

## 2019-03-18 DIAGNOSIS — M48062 Spinal stenosis, lumbar region with neurogenic claudication: Secondary | ICD-10-CM | POA: Insufficient documentation

## 2020-08-01 ENCOUNTER — Emergency Department (HOSPITAL_COMMUNITY): Payer: No Typology Code available for payment source

## 2020-08-01 ENCOUNTER — Emergency Department (HOSPITAL_COMMUNITY)
Admission: EM | Admit: 2020-08-01 | Discharge: 2020-08-02 | Disposition: A | Discharge status: Home / Self Care | Payer: No Typology Code available for payment source | Attending: Emergency Medicine | Admitting: Emergency Medicine

## 2020-08-01 ENCOUNTER — Encounter (HOSPITAL_COMMUNITY): Payer: Self-pay

## 2020-08-01 ENCOUNTER — Other Ambulatory Visit: Payer: Self-pay

## 2020-08-01 DIAGNOSIS — Z7984 Long term (current) use of oral hypoglycemic drugs: Secondary | ICD-10-CM | POA: Diagnosis not present

## 2020-08-01 DIAGNOSIS — J45909 Unspecified asthma, uncomplicated: Secondary | ICD-10-CM | POA: Diagnosis not present

## 2020-08-01 DIAGNOSIS — Z79899 Other long term (current) drug therapy: Secondary | ICD-10-CM | POA: Diagnosis not present

## 2020-08-01 DIAGNOSIS — E11319 Type 2 diabetes mellitus with unspecified diabetic retinopathy without macular edema: Secondary | ICD-10-CM | POA: Insufficient documentation

## 2020-08-01 DIAGNOSIS — R112 Nausea with vomiting, unspecified: Secondary | ICD-10-CM

## 2020-08-01 DIAGNOSIS — I10 Essential (primary) hypertension: Secondary | ICD-10-CM | POA: Insufficient documentation

## 2020-08-01 DIAGNOSIS — K920 Hematemesis: Secondary | ICD-10-CM

## 2020-08-01 DIAGNOSIS — Z794 Long term (current) use of insulin: Secondary | ICD-10-CM | POA: Insufficient documentation

## 2020-08-01 DIAGNOSIS — I700 Atherosclerosis of aorta: Secondary | ICD-10-CM | POA: Insufficient documentation

## 2020-08-01 DIAGNOSIS — E1140 Type 2 diabetes mellitus with diabetic neuropathy, unspecified: Secondary | ICD-10-CM | POA: Insufficient documentation

## 2020-08-01 LAB — CBC
HCT: 36.9 % (ref 36.0–46.0)
Hemoglobin: 11.8 g/dL — ABNORMAL LOW (ref 12.0–15.0)
MCH: 28 pg (ref 26.0–34.0)
MCHC: 32 g/dL (ref 30.0–36.0)
MCV: 87.6 fL (ref 80.0–100.0)
Platelets: 594 10*3/uL — ABNORMAL HIGH (ref 150–400)
RBC: 4.21 MIL/uL (ref 3.87–5.11)
RDW: 13.5 % (ref 11.5–15.5)
WBC: 14.9 10*3/uL — ABNORMAL HIGH (ref 4.0–10.5)
nRBC: 0 % (ref 0.0–0.2)

## 2020-08-01 LAB — URINALYSIS, ROUTINE W REFLEX MICROSCOPIC
Bilirubin Urine: NEGATIVE
Glucose, UA: NEGATIVE mg/dL
Hgb urine dipstick: NEGATIVE
Ketones, ur: NEGATIVE mg/dL
Leukocytes,Ua: NEGATIVE
Nitrite: NEGATIVE
Protein, ur: NEGATIVE mg/dL
Specific Gravity, Urine: 1.005 (ref 1.005–1.030)
pH: 7 (ref 5.0–8.0)

## 2020-08-01 IMAGING — DX DG CHEST 2V
2 series · 2 of 2 positions shown · non-contrast
Comparison: Radiograph [DATE]

CLINICAL DATA: Cough and throwing up blood.

EXAM:
CHEST - 2 VIEW

[chest pa]
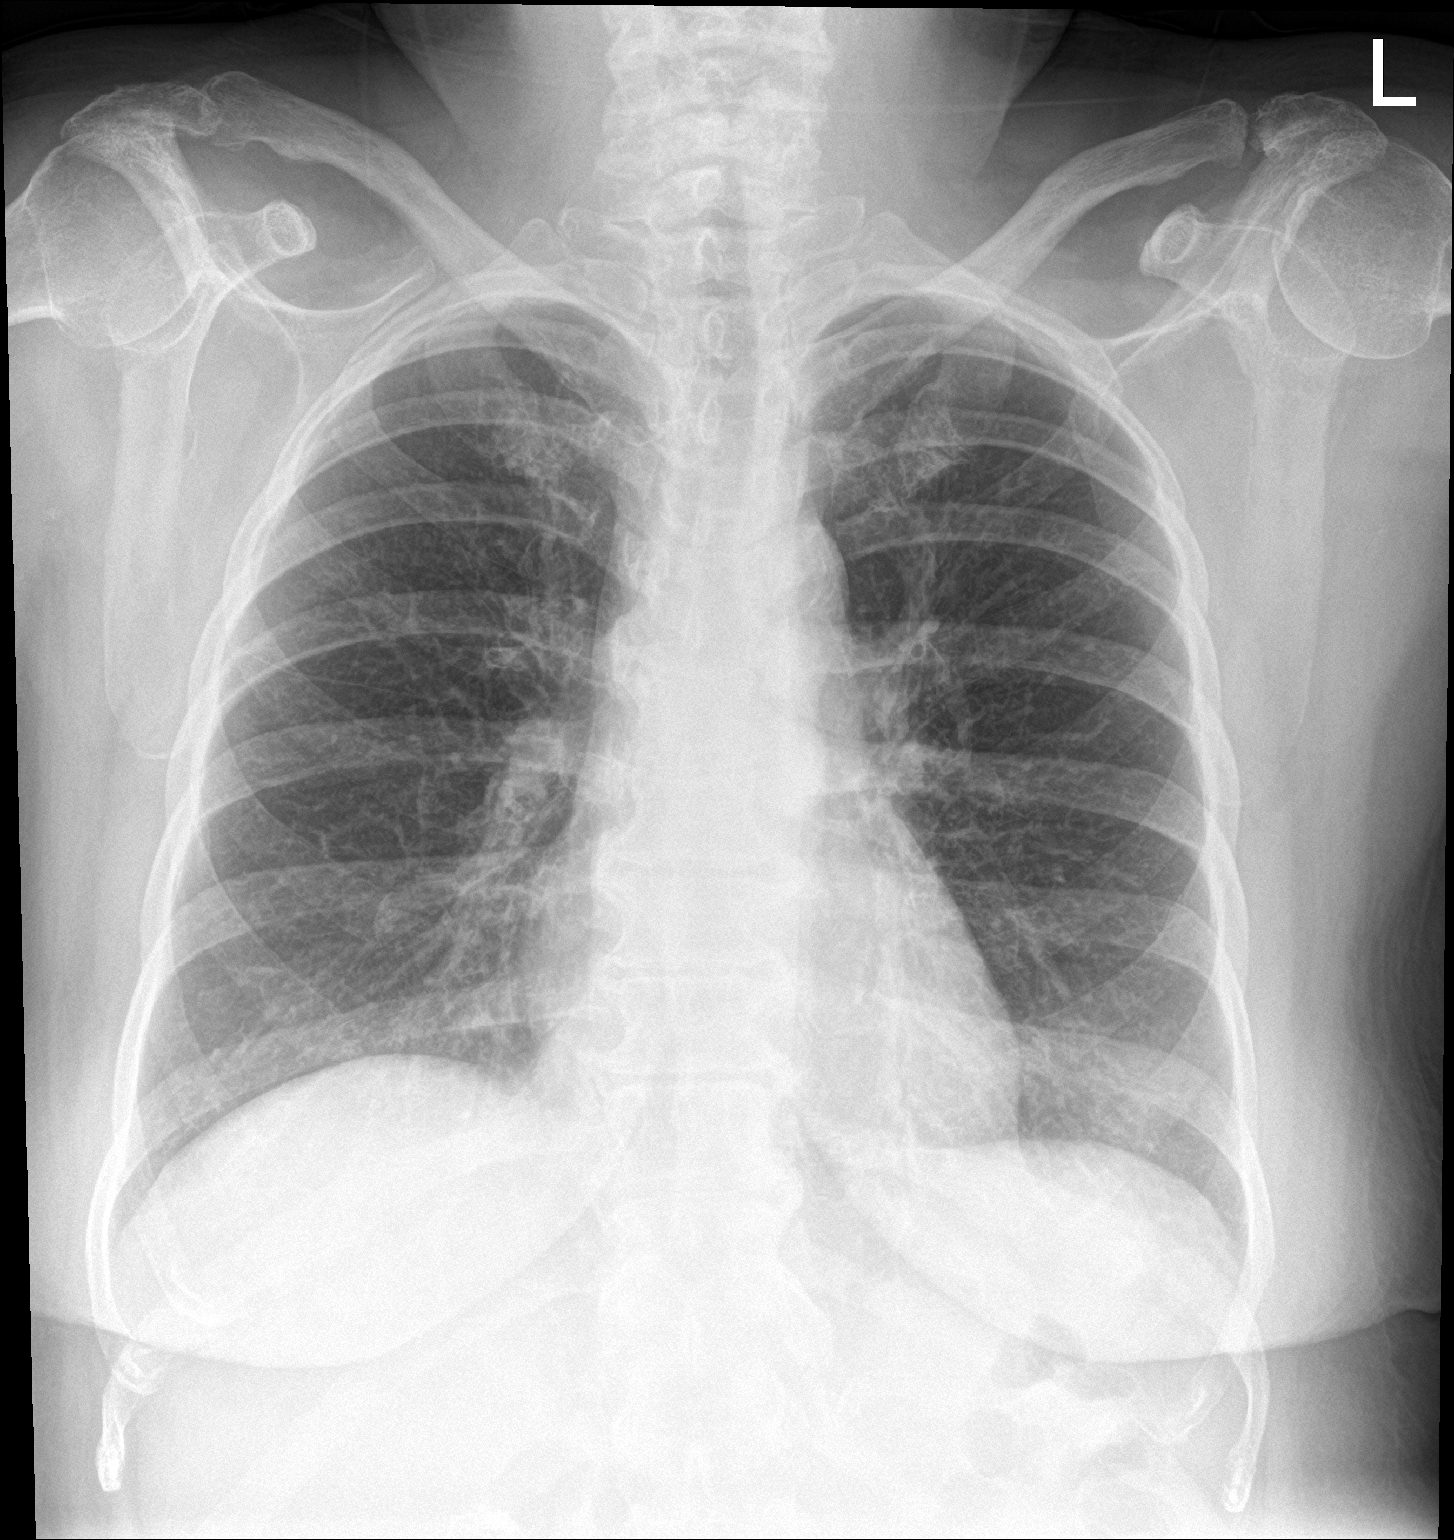

[chest lat]
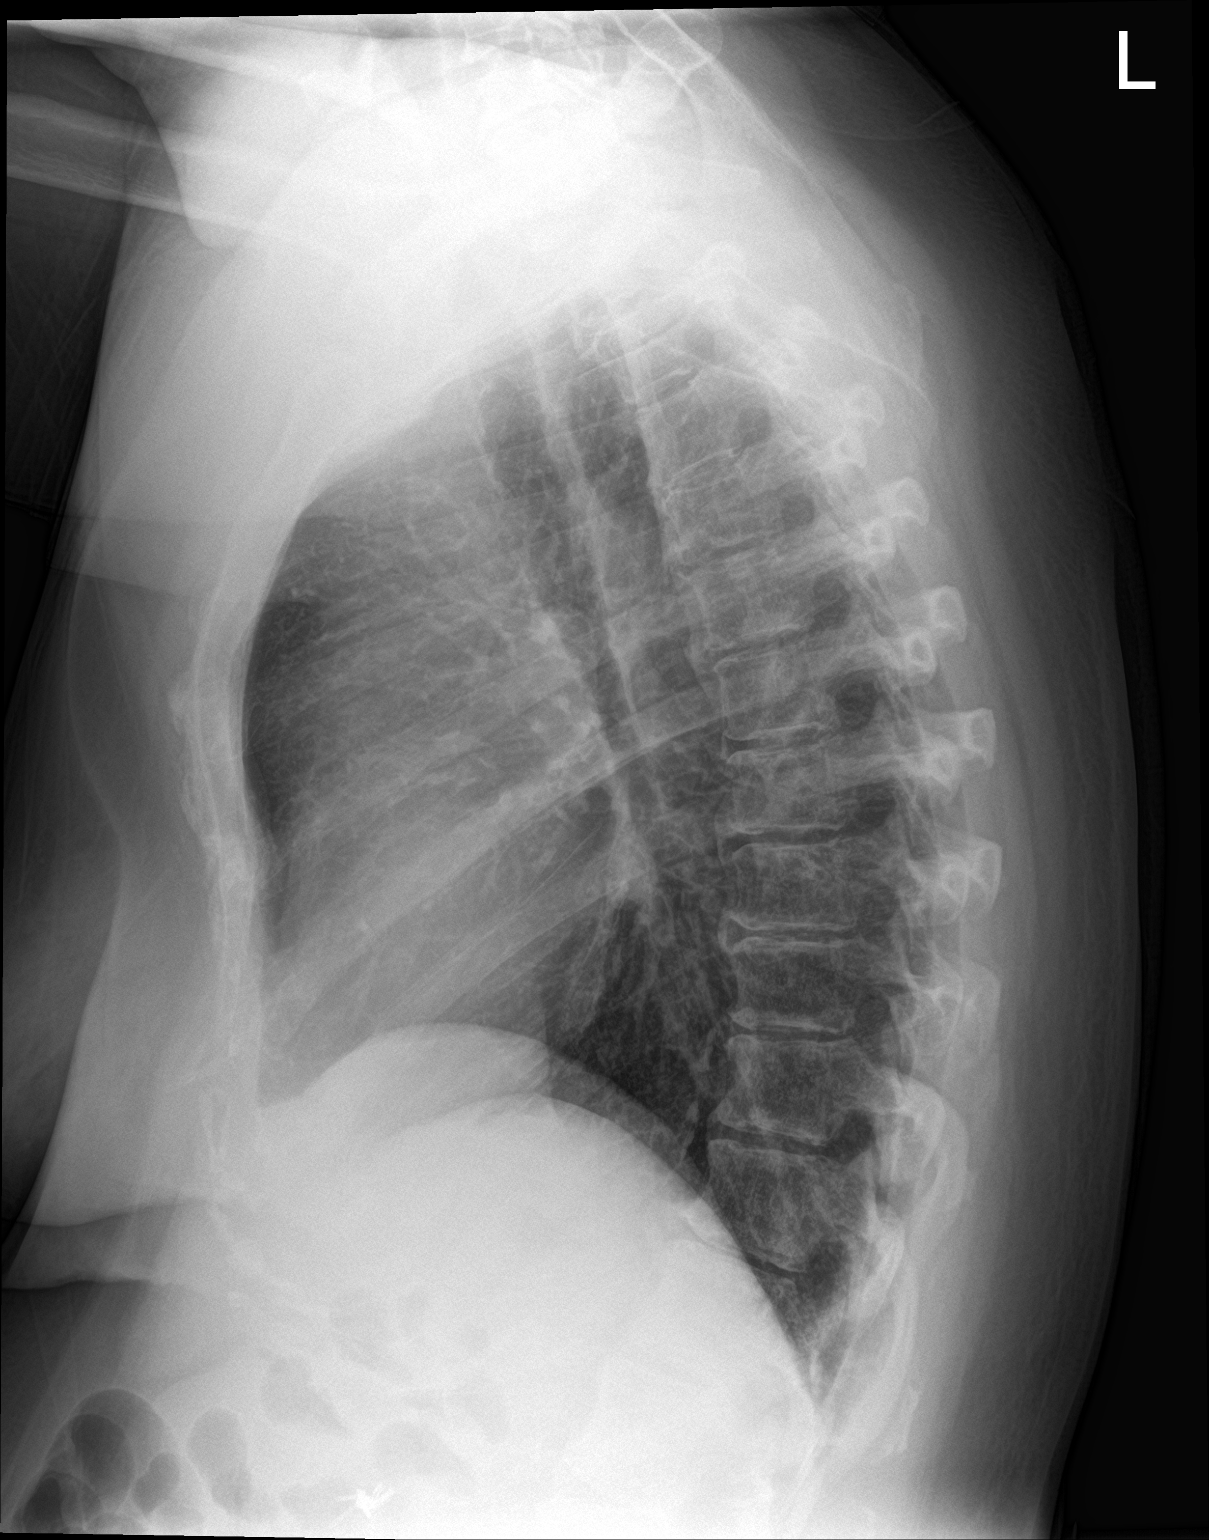

[2 of 2 positions shown; findings below may reference images not displayed]

FINDINGS: The cardiomediastinal contours are normal. No evidence of
pneumomediastinum. Mild bronchial thickening. Pulmonary vasculature
is normal. No consolidation, pleural effusion, or pneumothorax. No
acute osseous abnormalities are seen. Degenerative change in the
spine.
IMPRESSION: Mild bronchial thickening without pneumonia.

## 2020-08-01 NOTE — ED Triage Notes (Signed)
Recent dx with bronchitis. Pt was throwing up blood with clots. Not on any blood thinners.    Denies any abdominal pain.

## 2020-08-02 ENCOUNTER — Emergency Department (HOSPITAL_COMMUNITY): Payer: No Typology Code available for payment source

## 2020-08-02 LAB — COMPREHENSIVE METABOLIC PANEL
ALT: 11 U/L (ref 0–44)
AST: 13 U/L — ABNORMAL LOW (ref 15–41)
Albumin: 3.5 g/dL (ref 3.5–5.0)
Alkaline Phosphatase: 60 U/L (ref 38–126)
Anion gap: 8 (ref 5–15)
BUN: 20 mg/dL (ref 8–23)
CO2: 27 mmol/L (ref 22–32)
Calcium: 9.4 mg/dL (ref 8.9–10.3)
Chloride: 101 mmol/L (ref 98–111)
Creatinine, Ser: 1.14 mg/dL — ABNORMAL HIGH (ref 0.44–1.00)
GFR, Estimated: 53 mL/min — ABNORMAL LOW (ref >60)
Glucose, Bld: 199 mg/dL — ABNORMAL HIGH (ref 70–99)
Potassium: 4.1 mmol/L (ref 3.5–5.1)
Sodium: 136 mmol/L (ref 135–145)
Total Bilirubin: 0.2 mg/dL — ABNORMAL LOW (ref 0.3–1.2)
Total Protein: 7.1 g/dL (ref 6.5–8.1)

## 2020-08-02 LAB — LIPASE, BLOOD: Lipase: 38 U/L (ref 11–51)

## 2020-08-02 IMAGING — CT CT ANGIO CHEST
2 of 6 series · 19 of 36 positions shown · IV contrast (omnipaque)
Comparison: Chest radiograph dated [DATE].

CLINICAL DATA: 64-year-old female with concern for pulmonary
embolism.

EXAM:
CT ANGIOGRAPHY CHEST WITH CONTRAST
TECHNIQUE: Multidetector CT imaging of the chest was performed using the
standard protocol during bolus administration of intravenous
contrast. Multiplanar CT image reconstructions and MIPs were
obtained to evaluate the vascular anatomy.
CONTRAST:  100mL OMNIPAQUE IOHEXOL 350 MG/ML SOLN

[Series 9: pe thins · axial · 0.81mm/px · z∈[-155,+106]mm · 18 of 415 slices shown]
[im 21/415  lung]
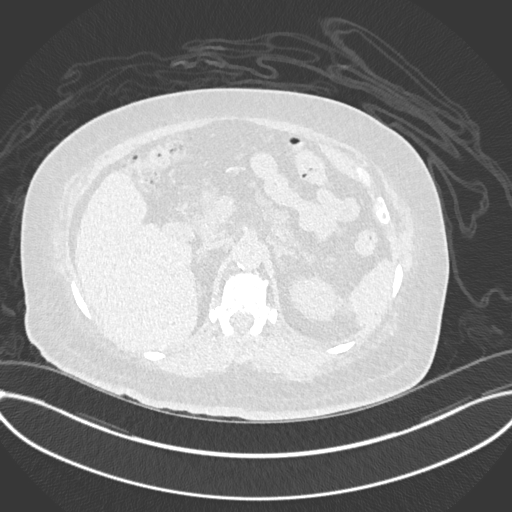
[im 42/415  mediastinal]
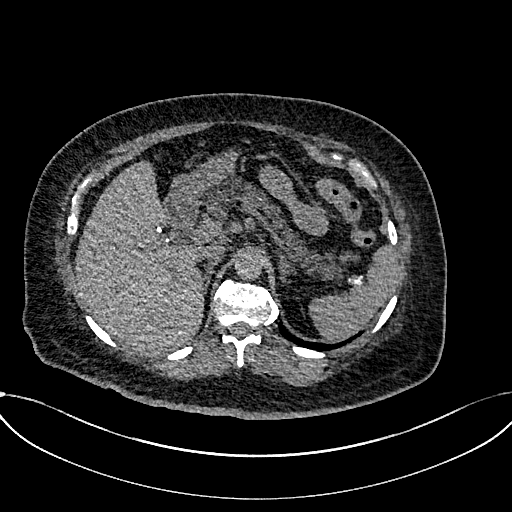
[im 63/415  lung]
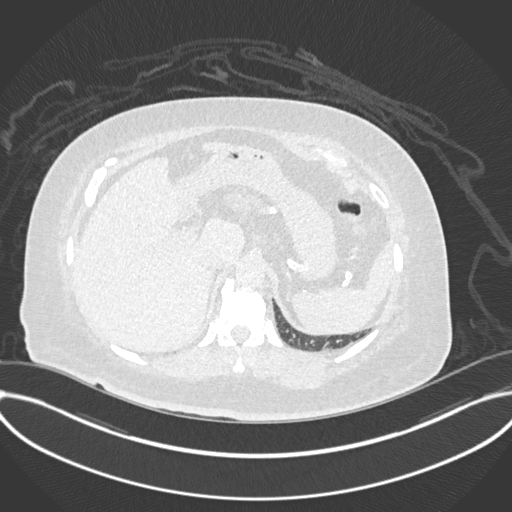
[im 83/415  mediastinal]
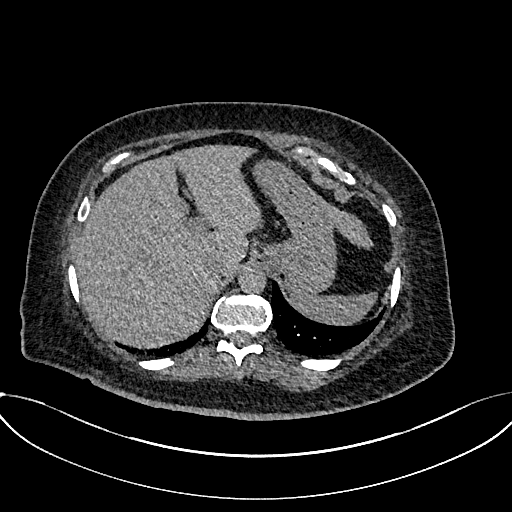
[im 104/415  lung]
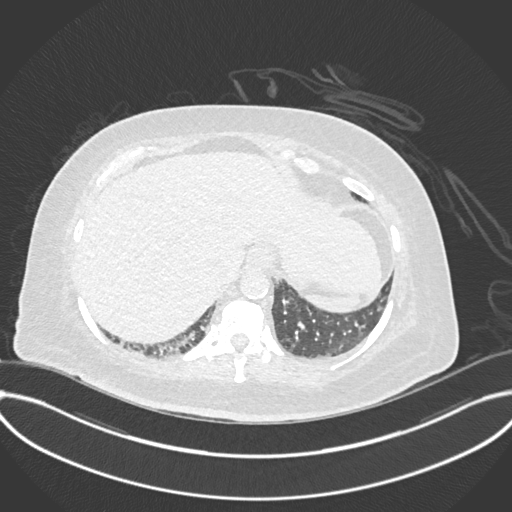
[im 125/415  mediastinal]
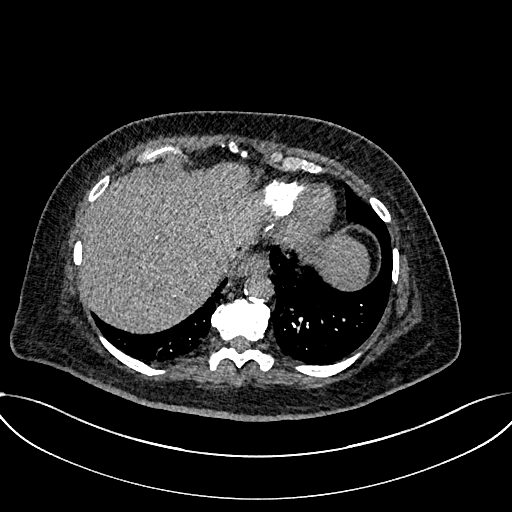
[im 145/415  lung]
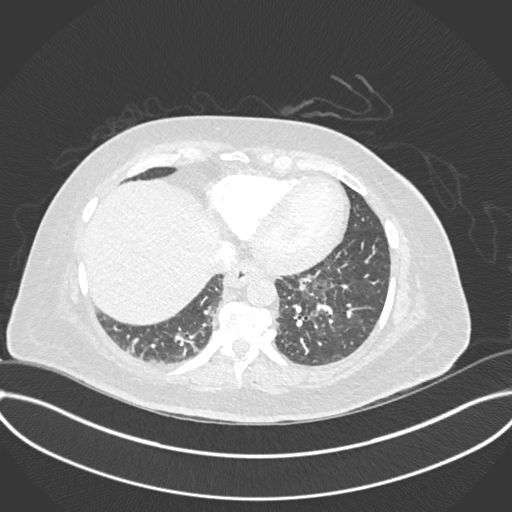
[im 166/415  mediastinal]
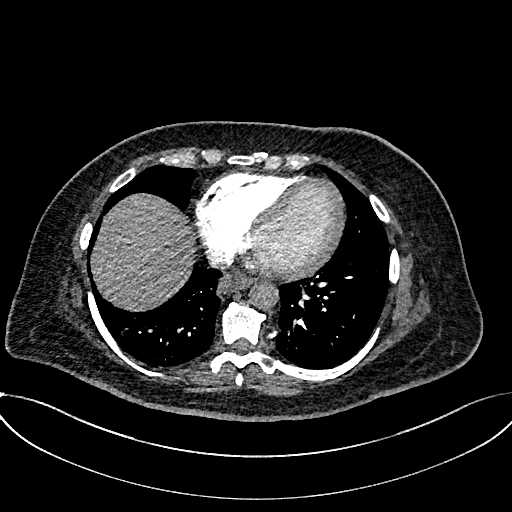
[im 187/415  lung]
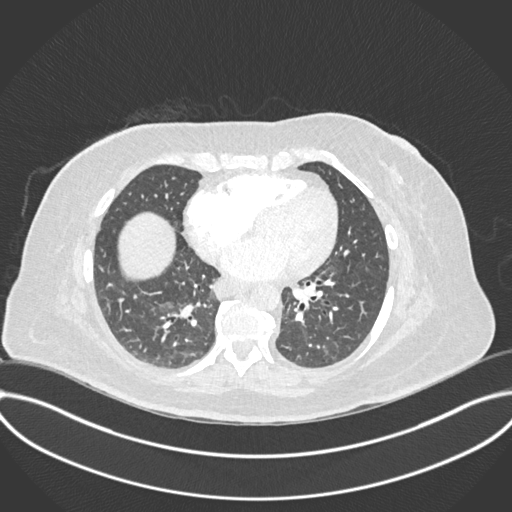
[im 228/415  mediastinal]
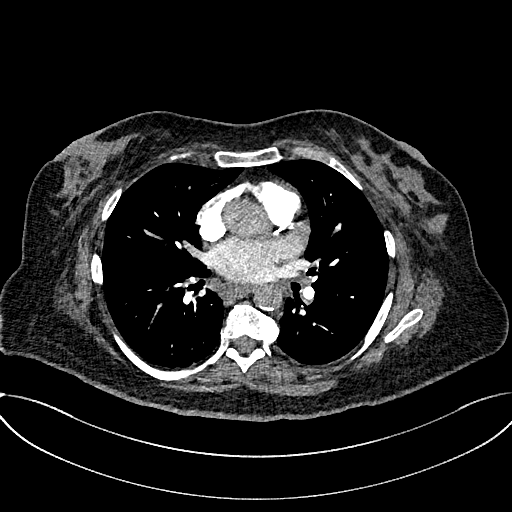
[im 249/415  lung]
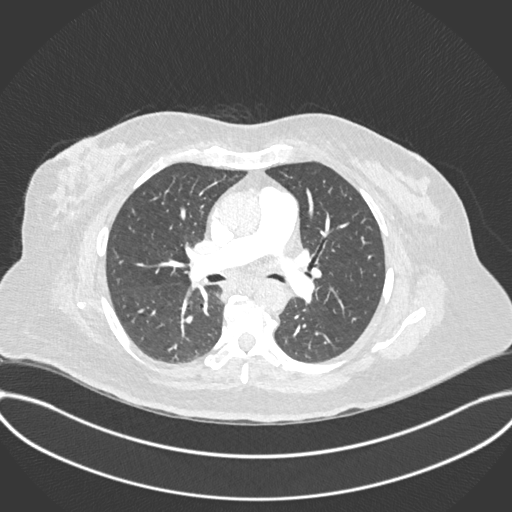
[im 270/415  mediastinal]
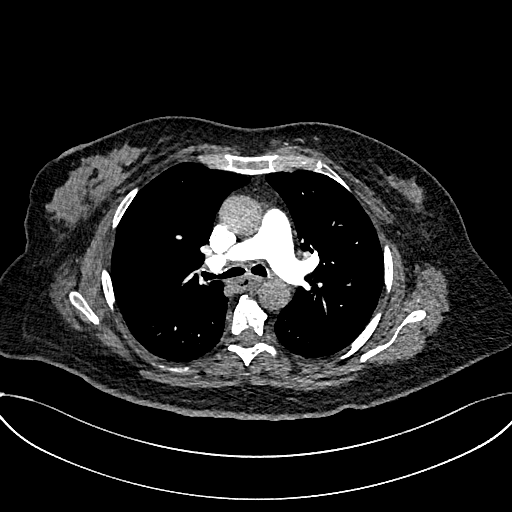
[im 290/415  lung]
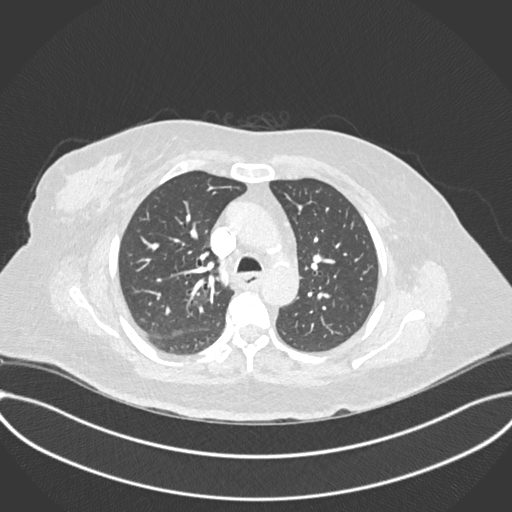
[im 311/415  mediastinal]
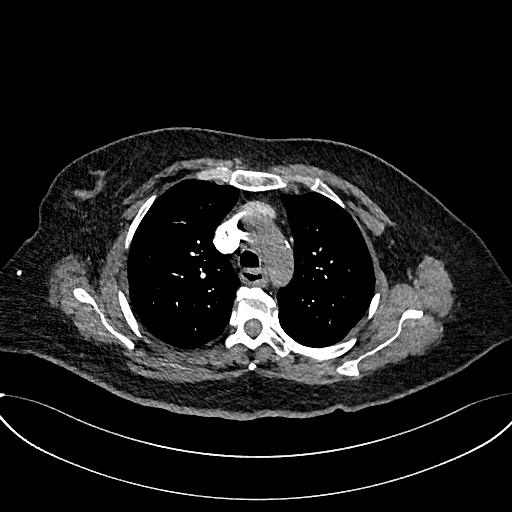
[im 332/415  lung]
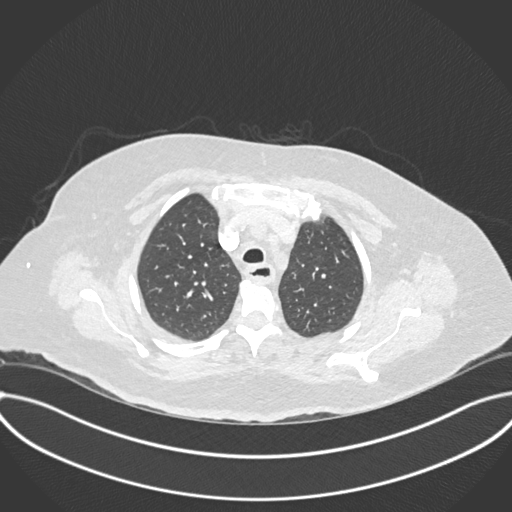
[im 352/415  mediastinal]
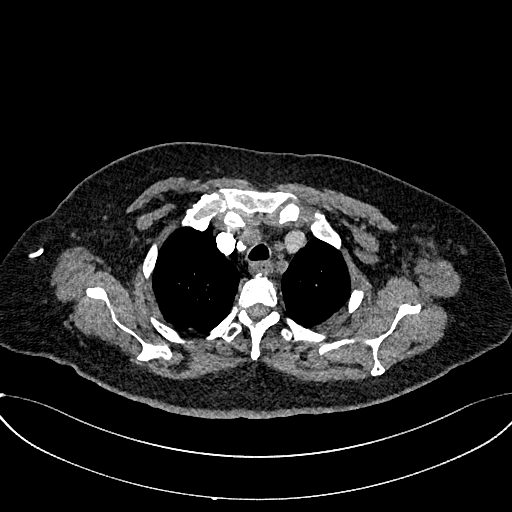
[im 373/415  lung]
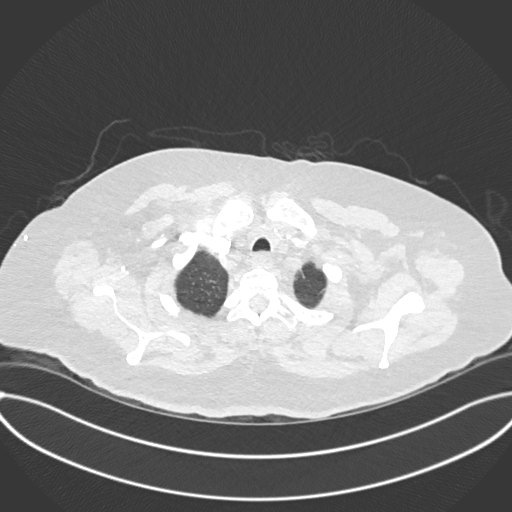
[im 394/415  mediastinal]
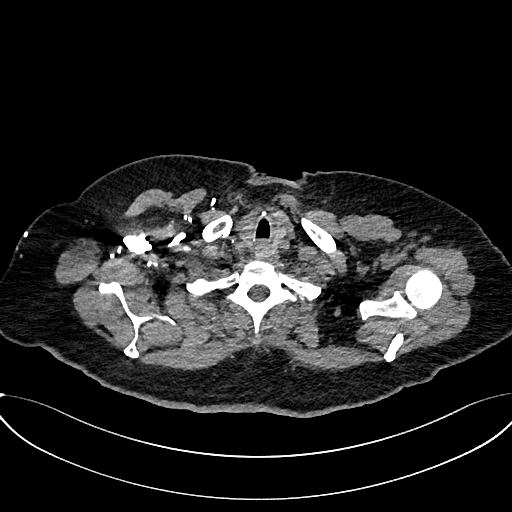

[Series 10: pe 2mm cor · coronal · 0.59mm/px · 1 of 151 slices shown]
[im 76/151  mediastinal]
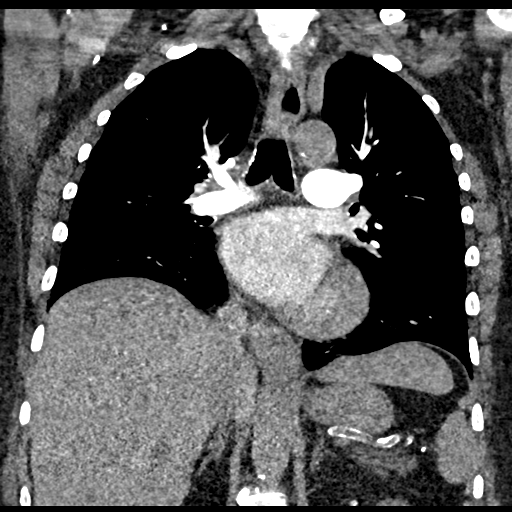

[19 of 36 positions shown; findings below may reference images not displayed]

FINDINGS: Cardiovascular: There is no cardiomegaly or pericardial effusion.
There is coronary vascular calcification of the LAD. Mild
atherosclerotic calcification of the thoracic aorta. No aneurysmal
dilatation. No pulmonary artery embolus identified.

Mediastinum/Nodes: No hilar or mediastinal adenopathy. The esophagus
is grossly unremarkable. No mediastinal fluid collection.

Lungs/Pleura: Minimal bibasilar atelectasis. Faint scattered
clusters of ground-glass densities may represent atelectasis.
Atypical infection is less likely but not excluded clinical
correlation recommended. No focal consolidation, pleural effusion,
or pneumothorax. The central airways are patent.

Upper Abdomen: Cholecystectomy.

Musculoskeletal: Degenerative changes of the spine. No acute osseous
pathology.

Review of the MIP images confirms the above findings.
IMPRESSION: 1. No CT evidence of pulmonary artery embolus.
2. Small faint ground-glass clusters may represent atelectasis or
less likely atypical infection. No focal consolidation.
3. Aortic Atherosclerosis ([1M]-[1M]).

## 2020-08-02 MED ORDER — PROCHLORPERAZINE EDISYLATE 10 MG/2ML IJ SOLN
10.0000 mg | Freq: Once | INTRAMUSCULAR | Status: AC
  Administered 2020-08-02: 10 mg via INTRAVENOUS
  Filled 2020-08-02: qty 2

## 2020-08-02 MED ORDER — LIDOCAINE VISCOUS HCL 2 % MT SOLN
15.0000 mL | Freq: Once | OROMUCOSAL | Status: AC
  Administered 2020-08-02: 15 mL via ORAL
  Filled 2020-08-02: qty 15

## 2020-08-02 MED ORDER — FAMOTIDINE IN NACL 20-0.9 MG/50ML-% IV SOLN
20.0000 mg | Freq: Once | INTRAVENOUS | Status: AC
  Administered 2020-08-02: 20 mg via INTRAVENOUS
  Filled 2020-08-02: qty 50

## 2020-08-02 MED ORDER — IOHEXOL 350 MG/ML SOLN
100.0000 mL | Freq: Once | INTRAVENOUS | Status: AC | PRN
  Administered 2020-08-02: 100 mL via INTRAVENOUS

## 2020-08-02 MED ORDER — PROCHLORPERAZINE MALEATE 10 MG PO TABS: 10.0000 mg | ORAL_TABLET | Freq: Two times a day (BID) | ORAL | 0 refills | Status: DC | PRN

## 2020-08-02 MED ORDER — ALUM & MAG HYDROXIDE-SIMETH 200-200-20 MG/5ML PO SUSP
30.0000 mL | Freq: Once | ORAL | Status: AC
  Administered 2020-08-02: 30 mL via ORAL
  Filled 2020-08-02: qty 30

## 2020-08-02 NOTE — Discharge Instructions (Signed)
I placed the referral in our system to the gastroenterology office.  You have severe gastritis, reflux, or stomach disease.  This should be evaluated by specialist.  Continue taking all your medications at home including the Carafate.  I prescribed a few tablets of Compazine to help with nausea.  For the next 2 days you stick to a soft, bland diet.  Avoid meats, spicy food, greasy food.  In general these are irritating to your stomach.  You should also avoid very hot beverages, black coffee, and food and drinks and have a lot of acid like orange juice.  Steroids like prednisone because stomach upset.  I would strongly consider stopping this medication, as I doubt it is helping with your bronchitis at this point.

## 2020-08-02 NOTE — ED Notes (Signed)
Medications follow up appts reviewed w/ pt. Denies questions or concerns @ this time. Evaluated for hematemesis. Pt only coughed phlegm for this RN, no blood noted. Aware GI follow up  Education on s/s of worsening and when to return. Rx given for compazine, aware to stop prednisone. Left w/ RN in w/NAD noted. PIV removed and VSS.

## 2020-08-02 NOTE — ED Provider Notes (Signed)
[facility] EMERGENCY DEPARTMENT Provider Note   CSN: [id number] Arrival date & time: 08/01/20  2301     History Chief Complaint  Patient presents with  . Hematemesis    Anna [name] is a 65 y.o. female with a history of reflux presenting to emergency department with nausea, vomiting, hematemesis.  The patient reports that she was having an attack of her chronic reflux, and began having several episodes of vomiting today.  She said towards the end she was vomiting bright streaks of blood.  This alarmed her and her daughter.  She also reports she has been coughing persistently for the past 2 weeks, was diagnosed with bronchitis by her PCP, and started on prednisone, azithromycin, Mucinex.  She has not a smoker.  She has had history of bronchitis in the past.  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  She is no longer on blood thinners.  She reports epigastric soreness from the vomiting.  She continues to feel nauseated.  She denies chest pain or pressure.  Hx of cholecystectomy  HPI     Past Medical History:  Diagnosis Date  . Arthritis   . Asthma   . Back pain   . Bronchitis   . Bursitis   . Diabetes mellitus   . Diabetic neuropathy (HCC)   . Diabetic retinopathy   . Hyperlipemia   . Hypertension   . Migraine   . Obesity   . Sepsis due to urinary tract infection [facility])     Patient Active Problem List   Diagnosis Date Noted  . Pyelonephritis 06/24/2014  . Diabetic neuropathy (HCC)   . Hypertension   . Type 2 diabetes mellitus (HCC)   . Asthma   . Acute pyelonephritis 06/23/2014    Past Surgical History:  Procedure Laterality Date  . CESAREAN SECTION     x3  . CHOLECYSTECTOMY    . EYE SURGERY Left      OB History   No obstetric history on file.     History reviewed. No pertinent family history.  Social History   Tobacco Use  . Smoking  status: Never Smoker  . Smokeless tobacco: Never Used  Substance Use Topics  . Alcohol use: No  . Drug use: No    Home Medications Prior to Admission medications   Medication Sig Start Date End Date Taking? Authorizing Provider  prochlorperazine (COMPAZINE) 10 MG tablet Take 1 tablet (10 mg total) by mouth 2 (two) times daily as needed for up to 10 doses for nausea or vomiting. 08/02/20  Yes Press Casale, Kermit Balo, MD  ALPRAZolam Prudy Feeler) 1 MG tablet Take 1 mg by mouth 3 (three) times daily as needed for anxiety.    [provider]  cyclobenzaprine (FLEXERIL) 5 MG tablet Take 5 mg by mouth 2 (two) times daily.    [provider]  FLUoxetine (PROZAC) 20 MG capsule Take 20 mg by mouth daily.    [provider]  gabapentin (NEURONTIN) 300 MG capsule Take 300 mg by mouth 3 (three) times daily.    [provider]  hydrochlorothiazide (HYDRODIURIL) 25 MG tablet Take 1 tablet (25 mg total) by mouth daily. 06/29/14   Richarda Overlie, MD  HYDROcodone-acetaminophen (NORCO) 10-325 MG per tablet Take 1 tablet by mouth every 6 (six) hours as needed. 06/25/14   Richarda Overlie, MD  insulin glargine (LANTUS) 100 UNIT/ML injection  Inject 100 Units into the skin at bedtime.     [provider]  insulin lispro (HUMALOG) 100 UNIT/ML injection Inject 2-15 Units into the skin daily as needed for high blood sugar. Use per sliding scale when sugar is high but not over 400    [provider]  insulin regular (NOVOLIN R,HUMULIN R) 100 units/mL injection Inject 20-35 Units into the skin 3 (three) times daily as needed for high blood sugar. Per sliding scale    [provider]  ipratropium-albuterol (DUONEB) 0.5-2.5 (3) MG/3ML SOLN Take 3 mLs by nebulization every 6 (six) hours as needed.    [provider]  lisinopril (PRINIVIL,ZESTRIL) 5 MG tablet Take 5 mg by mouth daily.    [provider]  metFORMIN (GLUCOPHAGE) 850 MG tablet Take 850 mg by mouth 2  (two) times daily with a meal.    [provider]  metFORMIN (GLUMETZA) 1000 MG (MOD) 24 hr tablet Take 1,000 mg by mouth 3 (three) times daily.    [provider]  naproxen (NAPROSYN) 500 MG tablet Take 500 mg by mouth 2 (two) times daily with a meal.    [provider]  oxyCODONE-acetaminophen (PERCOCET/ROXICET) 5-325 MG per tablet 1 to 2 tabs PO q6hrs  PRN for pain 08/15/14   Pisciotta, Joni Reining, PA-C  pantoprazole (PROTONIX) 40 MG tablet Take 40 mg by mouth daily.    [provider]  Potassium Chloride Crys CR (K-DUR PO) Take 20 mEq by mouth daily.     [provider]  promethazine (PHENERGAN) 12.5 MG tablet Take 1 tablet (12.5 mg total) by mouth every 6 (six) hours as needed for nausea or vomiting. 06/25/14   Richarda Overlie, MD  topiramate (TOPAMAX) 25 MG tablet Take 25 mg by mouth daily.    [provider]  TRAMADOL HCL PO Take 25 mg by mouth 3 (three) times daily as needed.     [provider]    Allergies    Morphine and related, Ciprofloxacin, Levaquin [levofloxacin in d5w], Soma [carisoprodol], and Lasix [furosemide]  Review of Systems   Review of Systems  Constitutional: Negative for chills and fever.  HENT: Negative for ear pain and sore throat.   Eyes: Negative for photophobia and visual disturbance.  Respiratory: Positive for cough. Negative for shortness of breath.   Cardiovascular: Negative for chest pain and palpitations.  Gastrointestinal: Positive for abdominal pain and nausea. Negative for vomiting.  Musculoskeletal: Negative for arthralgias and myalgias.  Skin: Negative for color change and rash.  Neurological: Negative for syncope and light-headedness.  All other systems reviewed and are negative.   Physical Exam Updated Vital Signs BP 122/78 (BP Location: Left Arm)   Pulse 82   Temp 98.2 F (36.8 C) (Oral)   Resp 14   Ht 5\' 2"  (1.575 m)   Wt 78 kg   SpO2 100%   BMI 31.46 kg/m   Physical  Exam Constitutional:      General: She is not in acute distress. HENT:     Head: Normocephalic and atraumatic.  Eyes:     Conjunctiva/sclera: Conjunctivae normal.     Pupils: Pupils are equal, round, and reactive to light.  Cardiovascular:     Rate and Rhythm: Normal rate and regular rhythm.  Pulmonary:     Effort: Pulmonary effort is normal. No respiratory distress.  Abdominal:     General: There is no distension.     Tenderness: There is abdominal tenderness in the epigastric area. There is  no guarding. Negative signs include Murphy's sign and McBurney's sign.  Skin:    General: Skin is warm and dry.  Neurological:     General: No focal deficit present.     Mental Status: She is alert. Mental status is at baseline.  Psychiatric:        Mood and Affect: Mood normal.        Behavior: Behavior normal.     ED Results / Procedures / Treatments   Labs (all labs ordered are listed, but only abnormal results are displayed) Labs Reviewed  COMPREHENSIVE METABOLIC PANEL - Abnormal; Notable for the following components:      Result Value   Glucose, Bld 199 (*)    Creatinine, Ser 1.14 (*)    AST 13 (*)    Total Bilirubin 0.2 (*)    GFR, Estimated 53 (*)    All other components within normal limits  CBC - Abnormal; Notable for the following components:   WBC 14.9 (*)    Hemoglobin 11.8 (*)    Platelets 594 (*)    All other components within normal limits  URINALYSIS, ROUTINE W REFLEX MICROSCOPIC - Abnormal; Notable for the following components:   Color, Urine STRAW (*)    All other components within normal limits  LIPASE, BLOOD    EKG None  Radiology DG Chest 2 View  Result Date: 08/01/2020 CLINICAL DATA:  Cough and throwing up blood. EXAM: CHEST - 2 VIEW COMPARISON:  Radiograph 11/25/2014 FINDINGS: The cardiomediastinal contours are normal. No evidence of pneumomediastinum. Mild bronchial thickening. Pulmonary vasculature is normal. No consolidation, pleural effusion, or  pneumothorax. No acute osseous abnormalities are seen. Degenerative change in the spine. IMPRESSION: Mild bronchial thickening without pneumonia. Electronically Signed   By: Narda Rutherford M.D.   On: 08/01/2020 23:54   CT Angio Chest PE W and/or Wo Contrast  Result Date: 08/02/2020 CLINICAL DATA:  65 year old female with concern for pulmonary embolism. EXAM: CT ANGIOGRAPHY CHEST WITH CONTRAST TECHNIQUE: Multidetector CT imaging of the chest was performed using the standard protocol during bolus administration of intravenous contrast. Multiplanar CT image reconstructions and MIPs were obtained to evaluate the vascular anatomy. CONTRAST:  OMNIPAQUE IOHEXOL 350 MG/ML SOLN COMPARISON:  Chest radiograph dated 08/01/2020. FINDINGS: Cardiovascular: There is no cardiomegaly or pericardial effusion. There is coronary vascular calcification of the LAD. Mild atherosclerotic calcification of the thoracic aorta. No aneurysmal dilatation. No pulmonary artery embolus identified. Mediastinum/Nodes: No hilar or mediastinal adenopathy. The esophagus is grossly unremarkable. No mediastinal fluid collection. Lungs/Pleura: Minimal bibasilar atelectasis. Faint scattered clusters of ground-glass densities may represent atelectasis. Atypical infection is less likely but not excluded clinical correlation recommended. No focal consolidation, pleural effusion, or pneumothorax. The central airways are patent. Upper Abdomen: Cholecystectomy. Musculoskeletal: Degenerative changes of the spine. No acute osseous pathology. Review of the MIP images confirms the above findings. IMPRESSION: 1. No CT evidence of pulmonary artery embolus. 2. Small faint ground-glass clusters may represent atelectasis or less likely atypical infection. No focal consolidation. 3. Aortic Atherosclerosis (ICD10-I70.0). Electronically Signed   By: Elgie Collard M.D.   On: 08/02/2020 02:28    Procedures Procedures   Medications Ordered in ED Medications   prochlorperazine (COMPAZINE) injection 10 mg (10 mg Intravenous Given 08/02/20 0055)  alum & mag hydroxide-simeth (MAALOX/MYLANTA) 200-200-20 MG/5ML suspension 30 mL (30 mLs Oral Given 08/02/20 0056)    And  lidocaine (XYLOCAINE) 2 % viscous mouth solution 15 mL (15 mLs Oral Given 08/02/20 0056)  famotidine (PEPCID) IVPB 20  mg premix (0 mg Intravenous Stopped 08/02/20 0205)  iohexol (OMNIPAQUE) 350 MG/ML injection 100 mL (100 mLs Intravenous Contrast Given 08/02/20 0209)    ED Course  I have reviewed the triage vital signs and the nursing notes.  Pertinent labs & imaging results that were available during my care of the patient were reviewed by me and considered in my medical decision making (see chart for details).  This patient presents to the Emergency Department with complaint of abdominal pain. This involves an extensive number of treatment options, and is a complaint that carries with it a high risk of complications and morbidity.  The differential diagnosis includes, but is not limited to, gastritis vs biliary disease vs peptic ulcer vs constipation vs colitis vs UTI vs other  Doubt ACS  I ordered, reviewed, and interpreted labs, with CMP near baseline.  WBC 14.9 in setting of prednisone use for bronchitis.   I ordered medication compazine, GI cocktail, pepcid for abdominal pain and/or nausea I ordered imaging studies which included CT PE and dg chest  I independently visualized and interpreted imaging which showed no PE, likely atelectasis in lung bases, and the monitor tracing which showed NSR   After the interventions stated above, I reevaluated the patient and found she has significant improvement of her symptoms.  I therefore highly suspect that this presentation is consistent with gastritis, which may have been triggered from recent steroid use, she has not been on steroids in a long time and these are known to be a gastric irritant.  She may have a Mallory-Weiss tear with a small  amount of hematemesis.  I doubt significant GI hemorrhage.  I advocated continuing her PPI, Pepcid, Carafate.  I can prescribe some Compazine as well for home.  I reviewed her CT findings which raise the suggestion of possible atypical infection.  She reports she has had 3 negative COVID tests in the past 2 weeks for the symptoms.  She also has no hypoxia or tachycardia.     Clinical Course as of 08/02/20 0456  Tue Aug 02, 2020  0245 Patient reports significant improvement of her nausea and GI symptoms.  No further vomiting.  I suspect this was gastritis with some mild hematemesis.  Advise she continue her PPI, Pepcid, as well as her Carafate.  Advise a soft, bland diet for the next 2 days.  CT scan reviewed, no evidence of bacterial pneumonia or PE.  Patient okay for discharge. [MT]    Clinical Course User Index [MT] Taitum Menton, Kermit BaloMatthew J, MD    Final Clinical Impression(s) / ED Diagnoses Final diagnoses:  Non-intractable vomiting with nausea, unspecified vomiting type  Hematemesis with nausea    Rx / DC Orders ED Discharge Orders         Ordered    Ambulatory referral to Gastroenterology       Comments: Significant reflux gastritis   08/02/20 0247    prochlorperazine (COMPAZINE) 10 MG tablet  2 times daily PRN        08/02/20 0250           Terald Sleeperrifan, Dajuana Palen J, MD 08/02/20 [phone]

## 2020-08-02 NOTE — ED Notes (Addendum)
Returned from CT resting w/ NAD. VSS.

## 2020-10-01 ENCOUNTER — Emergency Department (HOSPITAL_COMMUNITY): Payer: No Typology Code available for payment source

## 2020-10-01 ENCOUNTER — Other Ambulatory Visit: Payer: Self-pay

## 2020-10-01 ENCOUNTER — Emergency Department (HOSPITAL_COMMUNITY)
Admission: EM | Admit: 2020-10-01 | Discharge: 2020-10-01 | Disposition: A | Discharge status: Home / Self Care | Payer: No Typology Code available for payment source | Attending: Emergency Medicine | Admitting: Emergency Medicine

## 2020-10-01 ENCOUNTER — Encounter (HOSPITAL_COMMUNITY): Payer: Self-pay | Admitting: Emergency Medicine

## 2020-10-01 DIAGNOSIS — I10 Essential (primary) hypertension: Secondary | ICD-10-CM | POA: Diagnosis not present

## 2020-10-01 DIAGNOSIS — Z794 Long term (current) use of insulin: Secondary | ICD-10-CM | POA: Insufficient documentation

## 2020-10-01 DIAGNOSIS — E1140 Type 2 diabetes mellitus with diabetic neuropathy, unspecified: Secondary | ICD-10-CM | POA: Insufficient documentation

## 2020-10-01 DIAGNOSIS — Z79899 Other long term (current) drug therapy: Secondary | ICD-10-CM | POA: Diagnosis not present

## 2020-10-01 DIAGNOSIS — E11621 Type 2 diabetes mellitus with foot ulcer: Secondary | ICD-10-CM | POA: Insufficient documentation

## 2020-10-01 DIAGNOSIS — J45909 Unspecified asthma, uncomplicated: Secondary | ICD-10-CM | POA: Diagnosis not present

## 2020-10-01 DIAGNOSIS — Z7984 Long term (current) use of oral hypoglycemic drugs: Secondary | ICD-10-CM | POA: Insufficient documentation

## 2020-10-01 DIAGNOSIS — R2241 Localized swelling, mass and lump, right lower limb: Secondary | ICD-10-CM | POA: Diagnosis present

## 2020-10-01 DIAGNOSIS — L97519 Non-pressure chronic ulcer of other part of right foot with unspecified severity: Secondary | ICD-10-CM | POA: Insufficient documentation

## 2020-10-01 LAB — COMPREHENSIVE METABOLIC PANEL
ALT: 12 U/L (ref 0–44)
AST: 15 U/L (ref 15–41)
Albumin: 3.5 g/dL (ref 3.5–5.0)
Alkaline Phosphatase: 52 U/L (ref 38–126)
Anion gap: 9 (ref 5–15)
BUN: 16 mg/dL (ref 8–23)
CO2: 26 mmol/L (ref 22–32)
Calcium: 9.6 mg/dL (ref 8.9–10.3)
Chloride: 103 mmol/L (ref 98–111)
Creatinine, Ser: 1.06 mg/dL — ABNORMAL HIGH (ref 0.44–1.00)
GFR, Estimated: 58 mL/min — ABNORMAL LOW (ref >60)
Glucose, Bld: 179 mg/dL — ABNORMAL HIGH (ref 70–99)
Potassium: 4.5 mmol/L (ref 3.5–5.1)
Sodium: 138 mmol/L (ref 135–145)
Total Bilirubin: 0.4 mg/dL (ref 0.3–1.2)
Total Protein: 7.1 g/dL (ref 6.5–8.1)

## 2020-10-01 LAB — CBC WITH DIFFERENTIAL/PLATELET
Abs Immature Granulocytes: 0.03 10*3/uL (ref 0.00–0.07)
Basophils Absolute: 0.1 10*3/uL (ref 0.0–0.1)
Basophils Relative: 1 %
Eosinophils Absolute: 0.6 10*3/uL — ABNORMAL HIGH (ref 0.0–0.5)
Eosinophils Relative: 7 %
HCT: 37.4 % (ref 36.0–46.0)
Hemoglobin: 11.8 g/dL — ABNORMAL LOW (ref 12.0–15.0)
Immature Granulocytes: 0 %
Lymphocytes Relative: 37 %
Lymphs Abs: 3.1 10*3/uL (ref 0.7–4.0)
MCH: 28.2 pg (ref 26.0–34.0)
MCHC: 31.6 g/dL (ref 30.0–36.0)
MCV: 89.5 fL (ref 80.0–100.0)
Monocytes Absolute: 0.5 10*3/uL (ref 0.1–1.0)
Monocytes Relative: 6 %
Neutro Abs: 4.1 10*3/uL (ref 1.7–7.7)
Neutrophils Relative %: 49 %
Platelets: 476 10*3/uL — ABNORMAL HIGH (ref 150–400)
RBC: 4.18 MIL/uL (ref 3.87–5.11)
RDW: 13.5 % (ref 11.5–15.5)
WBC: 8.4 10*3/uL (ref 4.0–10.5)
nRBC: 0 % (ref 0.0–0.2)

## 2020-10-01 LAB — LACTIC ACID, PLASMA: Lactic Acid, Venous: 1.4 mmol/L (ref 0.5–1.9)

## 2020-10-01 IMAGING — CR DG FOOT COMPLETE 3+V*R*
3 series · 3 of 3 positions shown · non-contrast
Comparison: [DATE]

CLINICAL DATA: Swelling to right great toe with laceration.

EXAM:
RIGHT FOOT COMPLETE - 3+ VIEW

[foot ap]
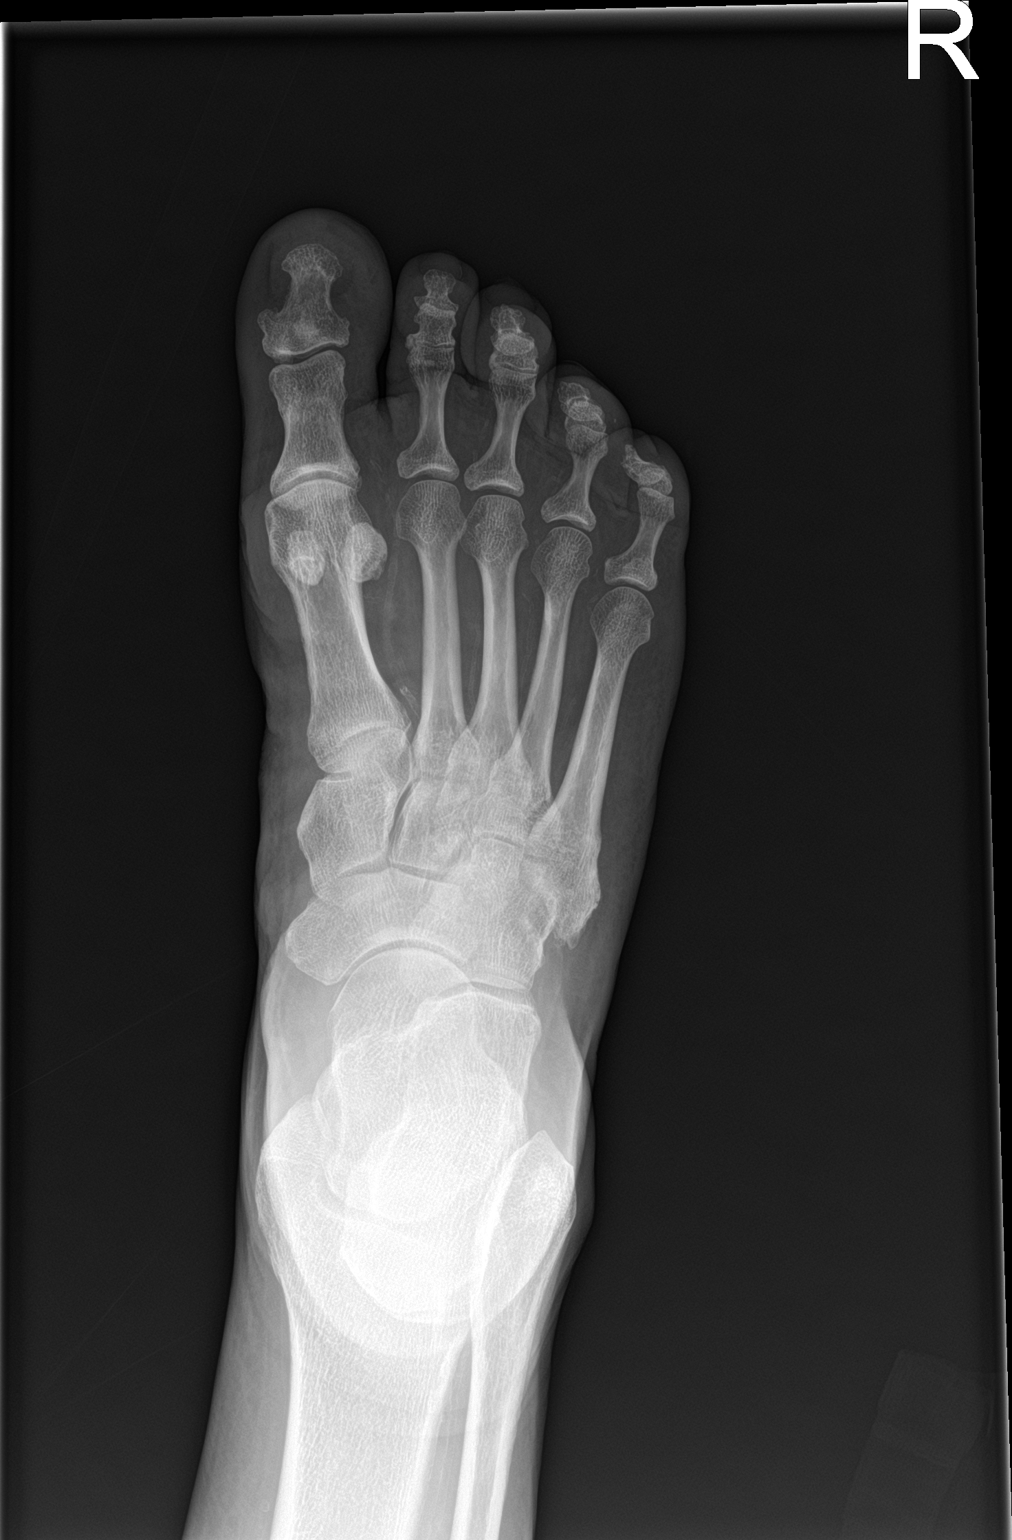

[foot obl]
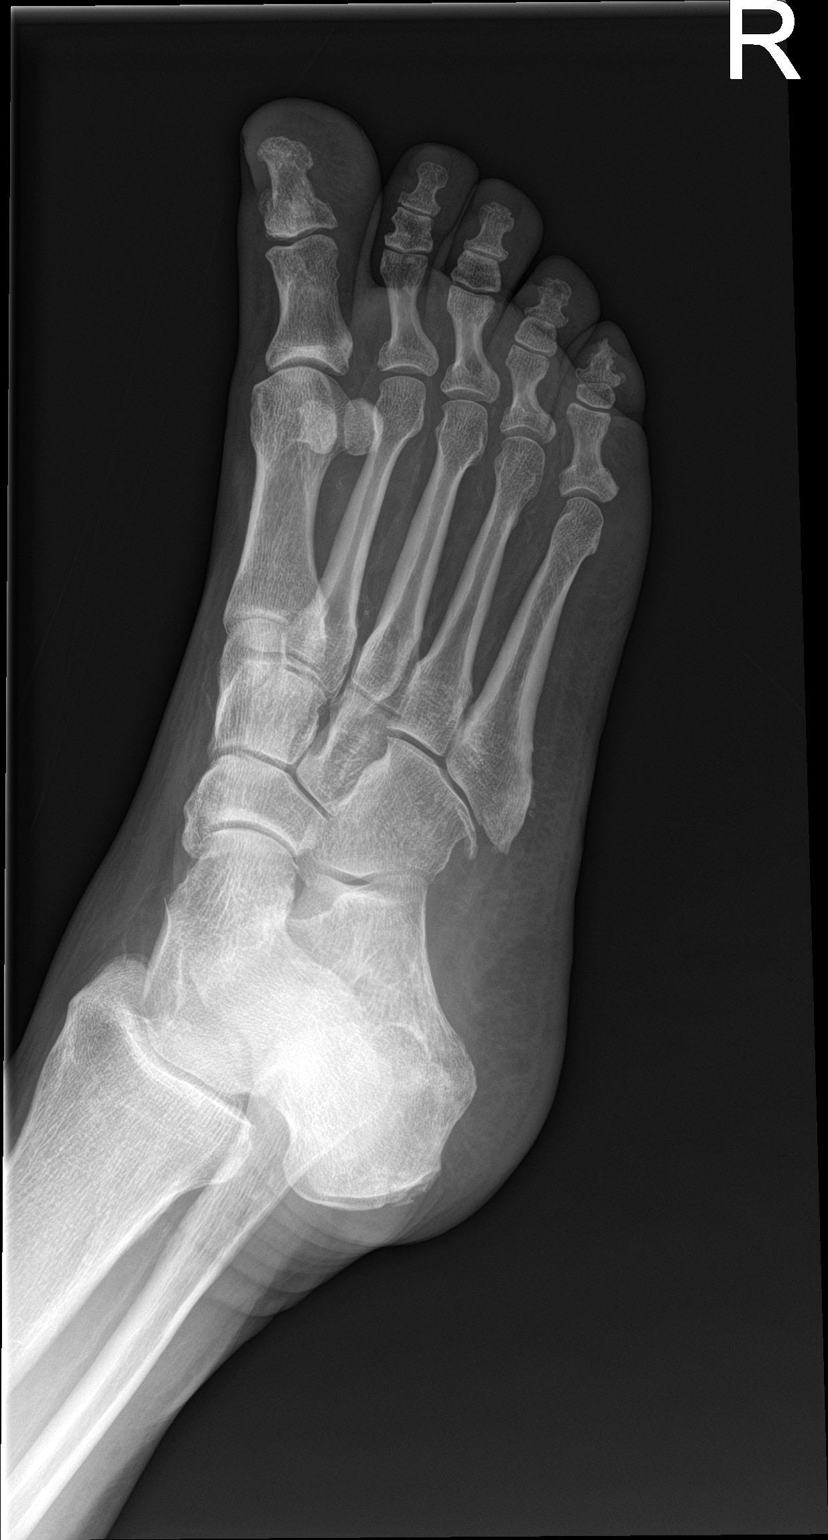

[foot lat]
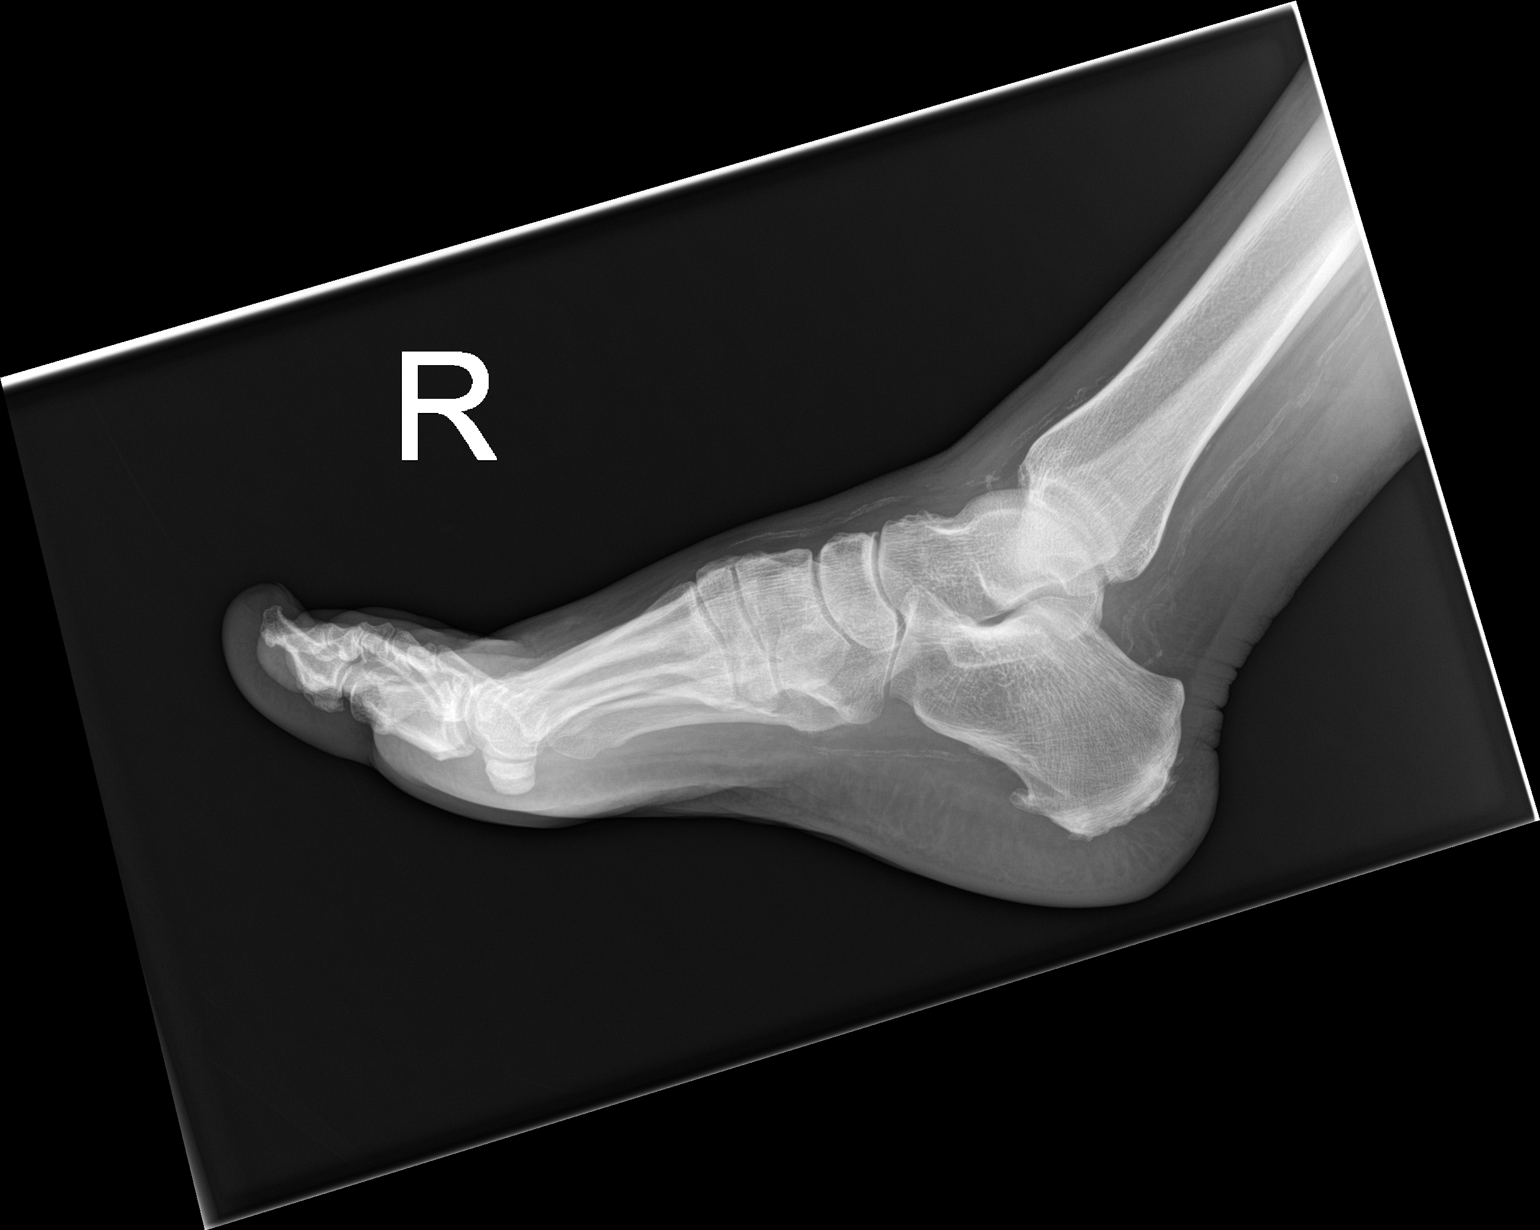

[3 of 3 positions shown; findings below may reference images not displayed]

FINDINGS: Mild soft tissue swelling about the great toe. No acute fracture or
dislocation. No osseous destruction. No radiopaque foreign object.
Vascular calcifications. Mild degenerative changes involve the first
metatarsophalangeal joint. Small calcaneal spur.
IMPRESSION: Soft tissue swelling, without acute osseous abnormality

## 2020-10-01 MED ORDER — DOXYCYCLINE HYCLATE 100 MG PO CAPS: 100.0000 mg | ORAL_CAPSULE | Freq: Two times a day (BID) | ORAL | 0 refills | Status: DC

## 2020-10-01 MED ORDER — HYDROCODONE-ACETAMINOPHEN 5-325 MG PO TABS: 1.0000 | ORAL_TABLET | Freq: Four times a day (QID) | ORAL | 0 refills | Status: DC | PRN

## 2020-10-01 MED ORDER — HYDROCODONE-ACETAMINOPHEN 5-325 MG PO TABS
1.0000 | ORAL_TABLET | Freq: Once | ORAL | Status: AC
Start: 2020-10-01 — End: 2020-10-01
  Administered 2020-10-01: 1 via ORAL
  Filled 2020-10-01: qty 1

## 2020-10-01 NOTE — ED Provider Notes (Signed)
[facility] EMERGENCY DEPARTMENT Provider Note   CSN: [id number] Arrival date & time: 10/01/20  0093     History No chief complaint on file.   Anna [name] is a 65 y.o. female.  HPI Patient is a 65 year old female with a history of diabetes mellitus, diabetic neuropathy, hyperlipidemia, hypertension, who presents to the emergency department due to a wound to the right great toe that started about 3 weeks ago.  Patient states her symptoms are worsening.  Reports associated pain and swelling to the right great toe.  She confirms her history of diabetes mellitus and states her sugars have been running consistently in the 200s.  Reports intermittent low-grade fevers and chills but typically occur at night.  She states her T-max is 100 F.  No abdominal pain, nausea, vomiting, urinary complaints.    Past Medical History:  Diagnosis Date   Arthritis    Asthma    Back pain    Bronchitis    Bursitis    Diabetes mellitus    Diabetic neuropathy (HCC)    Diabetic retinopathy    Hyperlipemia    Hypertension    Migraine    Obesity    Sepsis due to urinary tract infection [facility])     Patient Active Problem List   Diagnosis Date Noted   Pyelonephritis 06/24/2014   Diabetic neuropathy (HCC)    Hypertension    Type 2 diabetes mellitus (HCC)    Asthma    Acute pyelonephritis 06/23/2014    Past Surgical History:  Procedure Laterality Date   CESAREAN SECTION     x3   CHOLECYSTECTOMY     EYE SURGERY Left      OB History   No obstetric history on file.     No family history on file.  Social History   Tobacco Use   Smoking status: Never   Smokeless tobacco: Never  Substance Use Topics   Alcohol use: No   Drug use: No    Home Medications Prior to Admission medications   Medication Sig Start Date End Date Taking? Authorizing Provider  doxycycline (VIBRAMYCIN) 100 MG capsule Take 1 capsule (100 mg total) by mouth 2 (two) times daily. 10/01/20  Yes  Placido Sou, PA-C  HYDROcodone-acetaminophen (NORCO/VICODIN) 5-325 MG tablet Take 1 tablet by mouth every 6 (six) hours as needed. 10/01/20  Yes Placido Sou, PA-C  ALPRAZolam Prudy Feeler) 1 MG tablet Take 1 mg by mouth 3 (three) times daily as needed for anxiety.    [provider]  cyclobenzaprine (FLEXERIL) 5 MG tablet Take 5 mg by mouth 2 (two) times daily.    [provider]  FLUoxetine (PROZAC) 20 MG capsule Take 20 mg by mouth daily.    [provider]  gabapentin (NEURONTIN) 300 MG capsule Take 300 mg by mouth 3 (three) times daily.    [provider]  hydrochlorothiazide (HYDRODIURIL) 25 MG tablet Take 1 tablet (25 mg total) by mouth daily. 06/29/14   Richarda Overlie, MD  insulin glargine (LANTUS) 100 UNIT/ML injection Inject 100 Units into the skin at bedtime.     [provider]  insulin lispro (HUMALOG) 100 UNIT/ML injection Inject 2-15 Units into the skin daily as needed for high blood sugar. Use per sliding scale when sugar is high but not over 400    [provider]  insulin regular (NOVOLIN R,HUMULIN R) 100 units/mL injection Inject 20-35 Units into the skin 3 (three) times daily as needed for high blood sugar. Per  sliding scale    [provider]  ipratropium-albuterol (DUONEB) 0.5-2.5 (3) MG/3ML SOLN Take 3 mLs by nebulization every 6 (six) hours as needed.    [provider]  lisinopril (PRINIVIL,ZESTRIL) 5 MG tablet Take 5 mg by mouth daily.    [provider]  metFORMIN (GLUCOPHAGE) 850 MG tablet Take 850 mg by mouth 2 (two) times daily with a meal.    [provider]  metFORMIN (GLUMETZA) 1000 MG (MOD) 24 hr tablet Take 1,000 mg by mouth 3 (three) times daily.    [provider]  naproxen (NAPROSYN) 500 MG tablet Take 500 mg by mouth 2 (two) times daily with a meal.    [provider]  pantoprazole (PROTONIX) 40 MG tablet Take 40 mg by mouth daily.    [provider]   Potassium Chloride Crys CR (K-DUR PO) Take 20 mEq by mouth daily.     [provider]  prochlorperazine (COMPAZINE) 10 MG tablet Take 1 tablet (10 mg total) by mouth 2 (two) times daily as needed for up to 10 doses for nausea or vomiting. 08/02/20   Trifan, Kermit Balo, MD  promethazine (PHENERGAN) 12.5 MG tablet Take 1 tablet (12.5 mg total) by mouth every 6 (six) hours as needed for nausea or vomiting. 06/25/14   Richarda Overlie, MD  topiramate (TOPAMAX) 25 MG tablet Take 25 mg by mouth daily.    [provider]    Allergies    Morphine and related, Ciprofloxacin, Levaquin [levofloxacin in d5w], Soma [carisoprodol], and Lasix [furosemide]  Review of Systems   Review of Systems  All other systems reviewed and are negative. Ten systems reviewed and are negative for acute change, except as noted in the HPI.   Physical Exam Updated Vital Signs BP (!) 144/75   Pulse 80   Temp 98.2 F (36.8 C) (Oral)   Resp 16   Ht 5\' 2"  (1.575 m)   Wt 80 kg   SpO2 99%   BMI 32.26 kg/m   Physical Exam Vitals and nursing note reviewed.  Constitutional:      General: She is not in acute distress.    Appearance: Normal appearance. She is not ill-appearing, toxic-appearing or diaphoretic.  HENT:     Head: Normocephalic and atraumatic.     Right Ear: External ear normal.     Left Ear: External ear normal.     Nose: Nose normal.     Mouth/Throat:     Mouth: Mucous membranes are moist.     Pharynx: Oropharynx is clear. No oropharyngeal exudate or posterior oropharyngeal erythema.  Eyes:     General: No scleral icterus.       Right eye: No discharge.        Left eye: No discharge.     Extraocular Movements: Extraocular movements intact.     Conjunctiva/sclera: Conjunctivae normal.  Cardiovascular:     Rate and Rhythm: Normal rate and regular rhythm.     Pulses: Normal pulses.     Heart sounds: Normal heart sounds. No murmur heard.   No friction rub. No gallop.  Pulmonary:      Effort: Pulmonary effort is normal. No respiratory distress.     Breath sounds: Normal breath sounds. No stridor. No wheezing, rhonchi or rales.  Abdominal:     General: Abdomen is flat.     Palpations: Abdomen is soft.     Tenderness: There is no abdominal tenderness.     Comments: Abdomen is flat, soft, and  nontender.  Musculoskeletal:        General: Swelling and tenderness present. Normal range of motion.     Cervical back: Normal range of motion and neck supple. No tenderness.  Skin:    General: Skin is warm and dry.     Findings: Erythema present.     Comments: Moderate edema with erythema noted circumferentially around the right great toe with a developing wound to the lateral aspect of the right great toe with a central eschar.  Neurological:     General: No focal deficit present.     Mental Status: She is alert and oriented to person, place, and time.  Psychiatric:        Mood and Affect: Mood normal.        Behavior: Behavior normal.    ED Results / Procedures / Treatments   Labs (all labs ordered are listed, but only abnormal results are displayed) Labs Reviewed  COMPREHENSIVE METABOLIC PANEL - Abnormal; Notable for the following components:      Result Value   Glucose, Bld 179 (*)    Creatinine, Ser 1.06 (*)    GFR, Estimated 58 (*)    All other components within normal limits  CBC WITH DIFFERENTIAL/PLATELET - Abnormal; Notable for the following components:   Hemoglobin 11.8 (*)    Platelets 476 (*)    Eosinophils Absolute 0.6 (*)    All other components within normal limits  LACTIC ACID, PLASMA   EKG None  Radiology DG Foot Complete Right  Result Date: 10/01/2020 CLINICAL DATA:  Swelling to right great toe with laceration. EXAM: RIGHT FOOT COMPLETE - 3+ VIEW COMPARISON:  08/14/2013 FINDINGS: Mild soft tissue swelling about the great toe. No acute fracture or dislocation. No osseous destruction. No radiopaque foreign object. Vascular calcifications. Mild  degenerative changes involve the first metatarsophalangeal joint. Small calcaneal spur. IMPRESSION: Soft tissue swelling, without acute osseous abnormality Electronically Signed   By: Jeronimo GreavesKyle  Talbot M.D.   On: 10/01/2020 10:38    Procedures Procedures   Medications Ordered in ED Medications  HYDROcodone-acetaminophen (NORCO/VICODIN) 5-325 MG per tablet 1 tablet (1 tablet Oral Given 10/01/20 0959)   ED Course  I have reviewed the triage vital signs and the nursing notes.  Pertinent labs & imaging results that were available during my care of the patient were reviewed by me and considered in my medical decision making (see chart for details).    MDM Rules/Calculators/A&P                          Pt is a 65 y.o. female who presents to the emergency department due to a wound to her right great toe.  Labs: CBC with a hemoglobin of 11.8 and platelets of 476. CMP with a glucose of 179, creatinine of 1.06, GFR 58. Lactic acid 1.4.  Imaging: X-ray of the right foot shows soft tissue swelling without acute osseous abnormality.  I, Placido SouLogan Mlissa Tamayo, PA-C, personally reviewed and evaluated these images and lab results as part of my medical decision-making.  Patient has a history of diabetes mellitus and states that her blood sugars have been running in the 200s.  She also notes a history of previous diabetic ulcer in the past.  Her current wound started about 2 to 3 weeks ago and has been progressively worsening.  She does have some erythema and tenderness circumferentially in the right great toe with an eschar developing to the lateral aspect of the toe.  Labs today are generally reassuring.  Hemoglobin stable.  CBC without leukocytosis.  Patient is afebrile and not tachycardic.  Her main concern today was her pain which is not well controlled with OTC medications.  She was given a dose of Norco with significant relief of her pain.  Will discharge on a very short course of Norco.  We discussed safety  regarding this medication.  We will also discharge patient on 10 days of doxycycline for her developing infection in the foot.  She was given a referral to podiatry.  Patient given very strict return precautions and she knows to return to the emergency department with any new or worsening symptoms.  Also recommended PCP follow-up next week for recheck of the wound.  Feel that she is stable for discharge at this time and she is agreeable.  Her questions were answered and she was amicable at the time of discharge.  Note: Portions of this report may have been transcribed using voice recognition software. Every effort was made to ensure accuracy; however, inadvertent computerized transcription errors may be present.   Final Clinical Impression(s) / ED Diagnoses Final diagnoses:  Skin ulcer of right great toe, unspecified ulcer stage (HCC)   Rx / DC Orders ED Discharge Orders          Ordered    HYDROcodone-acetaminophen (NORCO/VICODIN) 5-325 MG tablet  Every 6 hours PRN        10/01/20 1203    doxycycline (VIBRAMYCIN) 100 MG capsule  2 times daily        10/01/20 1203             Placido Sou, PA-C 10/01/20 1341    Vanetta Mulders, MD 10/02/20 [phone]

## 2020-10-01 NOTE — ED Notes (Signed)
Patient transported to X-ray 

## 2020-10-01 NOTE — Discharge Instructions (Addendum)
I have prescribed you a strong narcotic called Vicodin. Please only take this as prescribed. This medication also has tylenol in it, so please be sure you are not taking more than 3000 mg of tylenol per day. Do not drive or operate heavy machinery after taking this medication. Do not mix it with alcohol.   I am also prescribing you an antibiotic called doxycycline.  Take this twice a day for the next 10 days.  Below is the contact information for Dr. Ovid Curd.  He is a [employer] education officer doctor.  Please give them a call and schedule an appointment for follow-up.  Please also follow-up with your regular doctor.  If you develop worsening fevers, swelling, pain, redness, discharge from the wound, please come back to the emergency department immediately for reevaluation.  It was a pleasure to meet you.

## 2020-10-01 NOTE — ED Triage Notes (Signed)
C/o infection to R great toe x 3 weeks with fever and chills at night.

## 2021-03-12 DIAGNOSIS — R200 Anesthesia of skin: Secondary | ICD-10-CM | POA: Insufficient documentation

## 2021-04-03 DIAGNOSIS — Z794 Long term (current) use of insulin: Secondary | ICD-10-CM | POA: Insufficient documentation

## 2021-04-03 DIAGNOSIS — E1152 Type 2 diabetes mellitus with diabetic peripheral angiopathy with gangrene: Secondary | ICD-10-CM | POA: Insufficient documentation

## 2021-04-11 DIAGNOSIS — I639 Cerebral infarction, unspecified: Secondary | ICD-10-CM | POA: Insufficient documentation

## 2021-04-11 DIAGNOSIS — Z8673 Personal history of transient ischemic attack (TIA), and cerebral infarction without residual deficits: Secondary | ICD-10-CM | POA: Insufficient documentation

## 2021-06-12 DIAGNOSIS — I739 Peripheral vascular disease, unspecified: Secondary | ICD-10-CM | POA: Insufficient documentation

## 2021-06-21 DIAGNOSIS — Z89422 Acquired absence of other left toe(s): Secondary | ICD-10-CM | POA: Insufficient documentation

## 2021-07-30 ENCOUNTER — Encounter (HOSPITAL_COMMUNITY): Payer: Self-pay

## 2021-07-30 ENCOUNTER — Other Ambulatory Visit: Payer: Self-pay

## 2021-07-30 ENCOUNTER — Emergency Department (HOSPITAL_COMMUNITY)
Admission: EM | Admit: 2021-07-30 | Discharge: 2021-07-30 | Disposition: A | Discharge status: Home / Self Care | Payer: No Typology Code available for payment source | Attending: Emergency Medicine | Admitting: Emergency Medicine

## 2021-07-30 ENCOUNTER — Emergency Department (HOSPITAL_COMMUNITY): Payer: No Typology Code available for payment source

## 2021-07-30 DIAGNOSIS — L03032 Cellulitis of left toe: Secondary | ICD-10-CM | POA: Insufficient documentation

## 2021-07-30 DIAGNOSIS — R112 Nausea with vomiting, unspecified: Secondary | ICD-10-CM | POA: Insufficient documentation

## 2021-07-30 DIAGNOSIS — L089 Local infection of the skin and subcutaneous tissue, unspecified: Secondary | ICD-10-CM

## 2021-07-30 DIAGNOSIS — M25572 Pain in left ankle and joints of left foot: Secondary | ICD-10-CM | POA: Diagnosis not present

## 2021-07-30 DIAGNOSIS — M791 Myalgia, unspecified site: Secondary | ICD-10-CM | POA: Diagnosis not present

## 2021-07-30 DIAGNOSIS — R509 Fever, unspecified: Secondary | ICD-10-CM | POA: Diagnosis present

## 2021-07-30 DIAGNOSIS — Z794 Long term (current) use of insulin: Secondary | ICD-10-CM | POA: Diagnosis not present

## 2021-07-30 LAB — BASIC METABOLIC PANEL
Anion gap: 7 (ref 5–15)
BUN: 15 mg/dL (ref 8–23)
CO2: 24 mmol/L (ref 22–32)
Calcium: 8.8 mg/dL — ABNORMAL LOW (ref 8.9–10.3)
Chloride: 110 mmol/L (ref 98–111)
Creatinine, Ser: 0.97 mg/dL (ref 0.44–1.00)
GFR, Estimated: >60 mL/min (ref >60)
Glucose, Bld: 160 mg/dL — ABNORMAL HIGH (ref 70–99)
Potassium: 4.5 mmol/L (ref 3.5–5.1)
Sodium: 141 mmol/L (ref 135–145)

## 2021-07-30 LAB — CBC
HCT: 31.3 % — ABNORMAL LOW (ref 36.0–46.0)
Hemoglobin: 9.9 g/dL — ABNORMAL LOW (ref 12.0–15.0)
MCH: 28.4 pg (ref 26.0–34.0)
MCHC: 31.6 g/dL (ref 30.0–36.0)
MCV: 89.9 fL (ref 80.0–100.0)
Platelets: 420 10*3/uL — ABNORMAL HIGH (ref 150–400)
RBC: 3.48 MIL/uL — ABNORMAL LOW (ref 3.87–5.11)
RDW: 14 % (ref 11.5–15.5)
WBC: 9.8 10*3/uL (ref 4.0–10.5)
nRBC: 0 % (ref 0.0–0.2)

## 2021-07-30 IMAGING — DX DG FOOT COMPLETE 3+V*L*
3 series · 3 of 3 positions shown · non-contrast
Comparison: [DATE]

CLINICAL DATA: History of recent amputation of the fifth left toe,
presenting with green/yellow pus coming out of the wound and pain in
the left calf.

EXAM:
LEFT FOOT - COMPLETE 3+ VIEW

[foot ap]
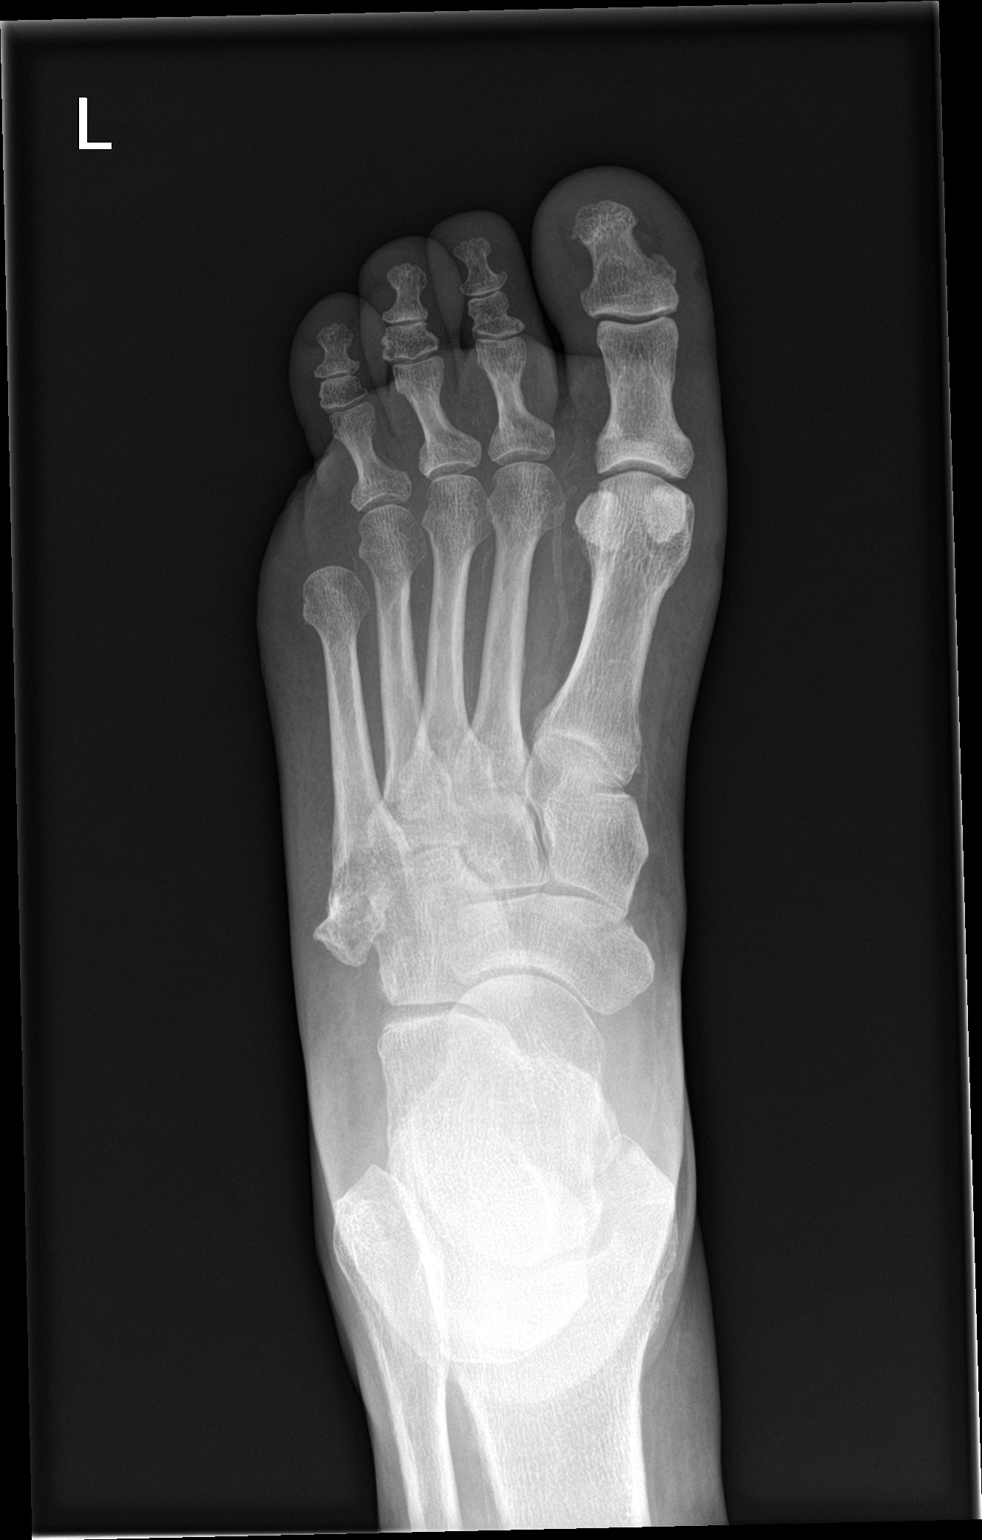

[foot obl]
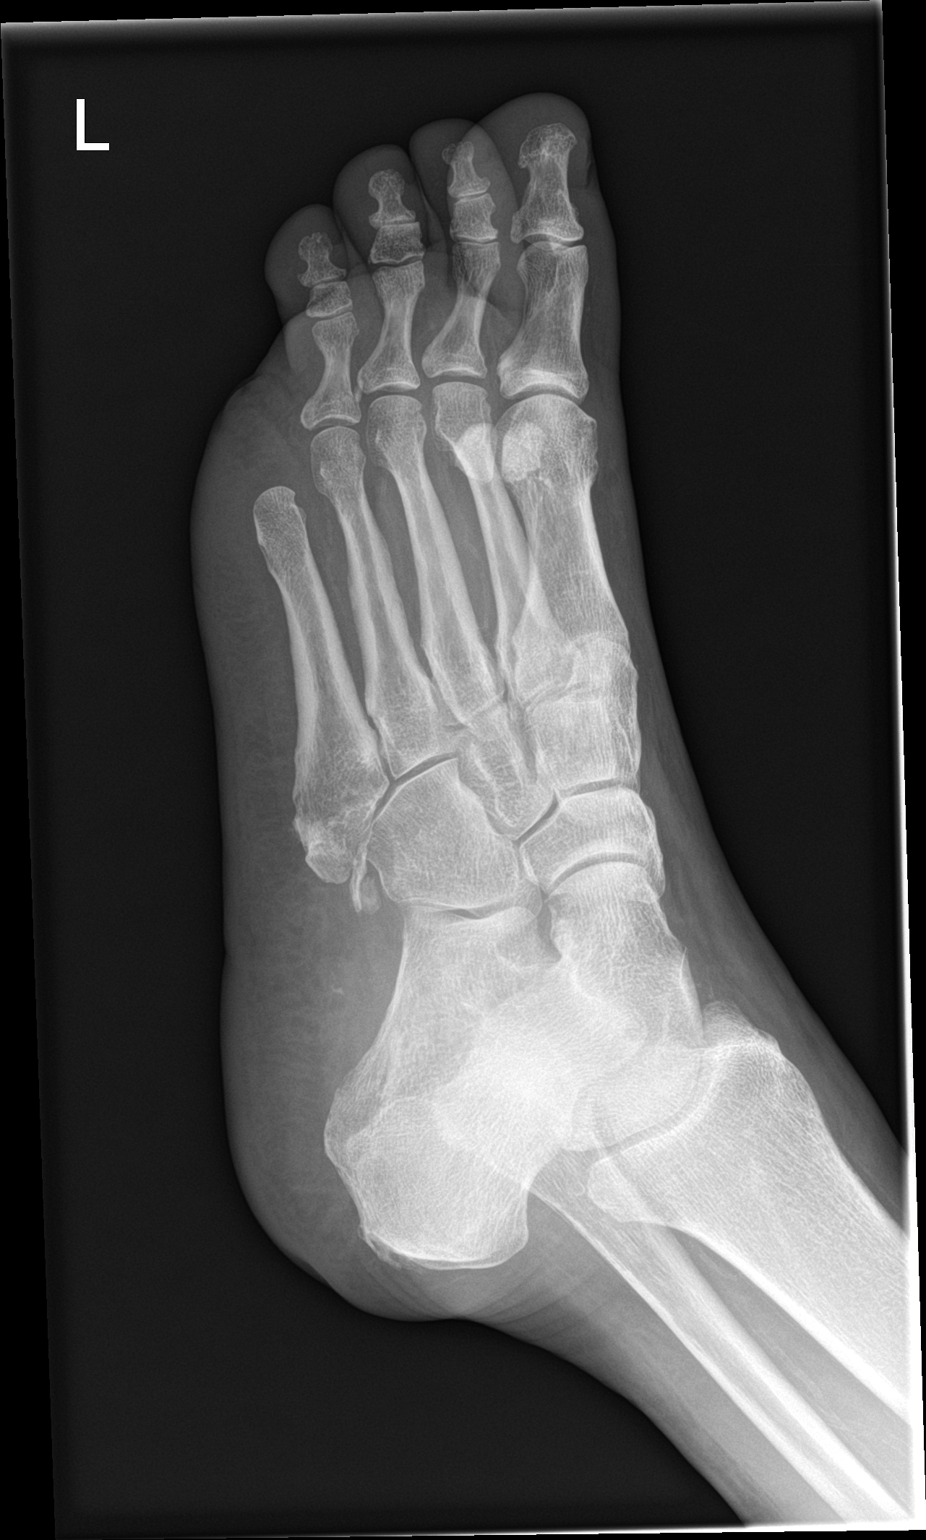

[foot lat]
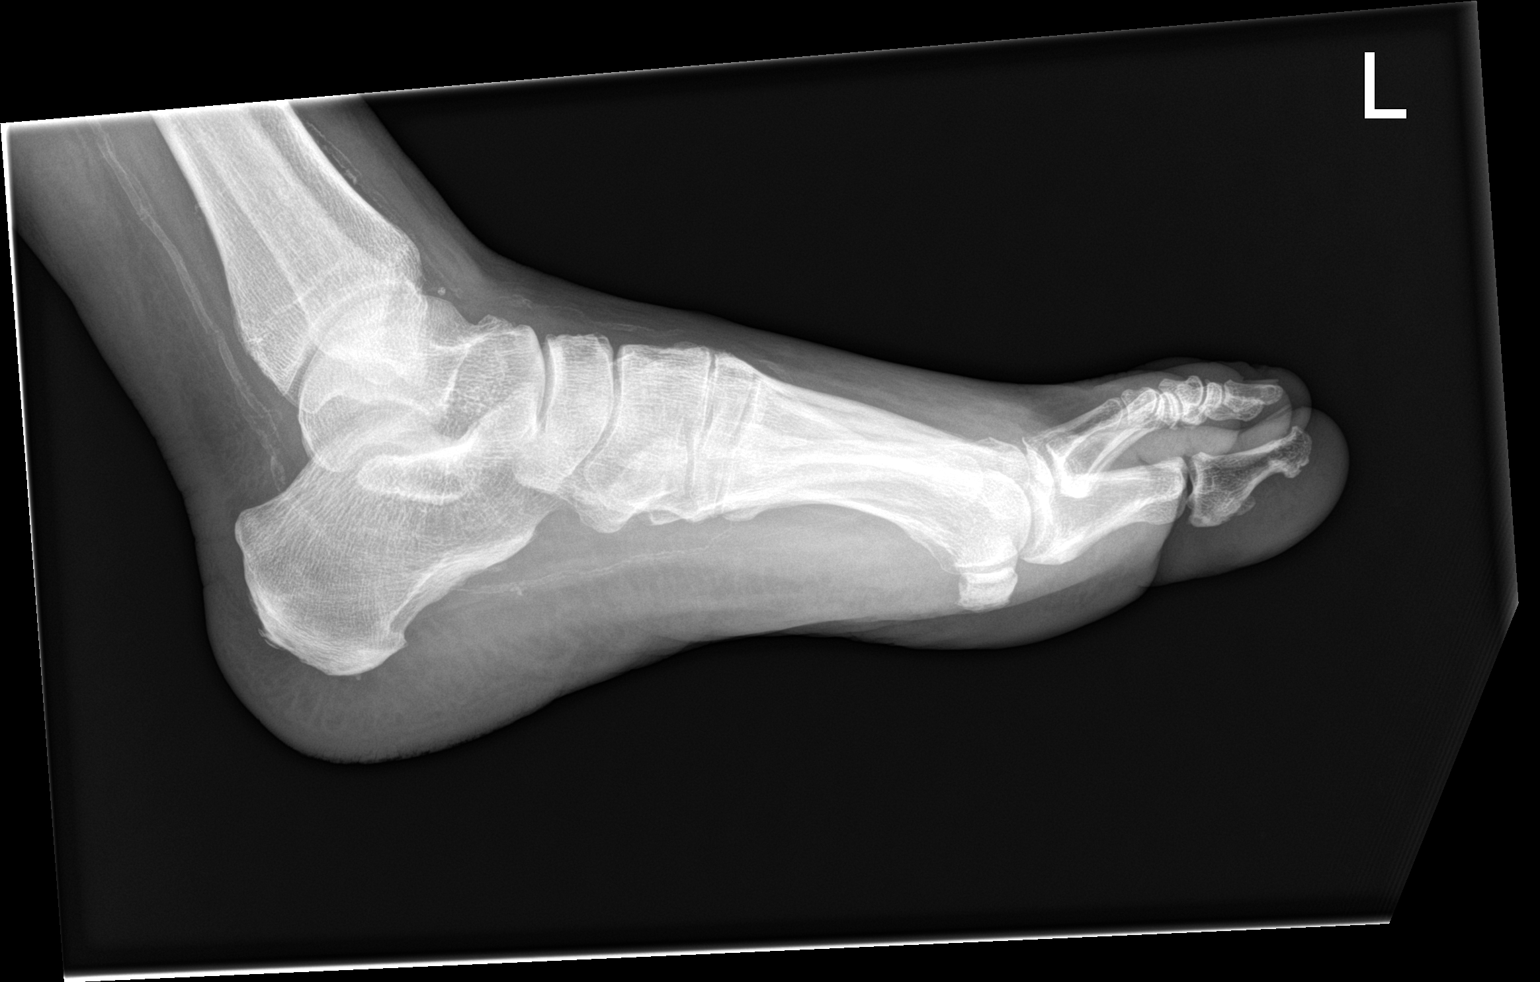

[3 of 3 positions shown; findings below may reference images not displayed]

FINDINGS: There is evidence of surgical amputation of the fifth left toe.
There is no evidence of an acute fracture or dislocation. A chronic
deformity is seen involving the base of the fifth left metatarsal.
There is no evidence of arthropathy or other focal bone abnormality.
Soft tissues along the surgical site are unremarkable.
IMPRESSION: Status post surgical amputation of the fifth left toe.

## 2021-07-30 MED ORDER — DOXYCYCLINE HYCLATE 100 MG PO CAPS: 100.0000 mg | ORAL_CAPSULE | Freq: Two times a day (BID) | ORAL | 0 refills | Status: DC

## 2021-07-30 MED ORDER — FENTANYL CITRATE PF 50 MCG/ML IJ SOSY
50.0000 ug | PREFILLED_SYRINGE | Freq: Once | INTRAMUSCULAR | Status: AC
  Administered 2021-07-30: 50 ug via INTRAVENOUS
  Filled 2021-07-30: qty 1

## 2021-07-30 MED ORDER — FLUCONAZOLE 150 MG PO TABS: 150.0000 mg | ORAL_TABLET | Freq: Every day | ORAL | 0 refills | Status: AC

## 2021-07-30 MED ORDER — VANCOMYCIN HCL IN DEXTROSE 1-5 GM/200ML-% IV SOLN
1000.0000 mg | Freq: Once | INTRAVENOUS | Status: AC
  Administered 2021-07-30: 1000 mg via INTRAVENOUS
  Filled 2021-07-30: qty 200

## 2021-07-30 NOTE — ED Provider Notes (Signed)
?[facility]-EMERGENCY DEPT ?Provider Note ? ? ?CSN: [id number] ?Arrival date & time: 07/30/21  1947 ? ?  ? ?History ? ?Chief Complaint  ?Patient presents with  ? Toe Pain  ? ? ?Anna [name] is a 66 y.o. female who presents ED for evaluation of left fifth toe infection.  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  Patient states that since then, she has had stitches which were removed 2 weeks ago.  The patient states that 3 days ago the toe began to appear infected, was draining puslike fluid.  Patient states that she was given antibiotics to take after surgery however due to social situation, unstable home situation, she was unable to take his medications as prescribed.  Patient states that she was able to find her medication, doxycycline, 3 days ago and has been taking it since.  Patient endorsing body aches, chills, fever, nausea and vomiting at home.  Patient also complaining of pain in her left ankle and calf that is worsened with walking.  The patient has a history of PAD, and recently had a stent placed in her leg.  Patient states that this pain relieved itself when she stopped walking. ? ? ?Toe Pain ? ? ?  ? ?Home Medications ?Prior to Admission medications   ?Medication Sig Start Date End Date Taking? Authorizing Provider  ?doxycycline (VIBRAMYCIN) 100 MG capsule Take 1 capsule (100 mg total) by mouth 2 (two) times daily. 07/30/21  Yes Al DecantGroce, Kolyn Rozario F, PA-C  ?fluconazole (DIFLUCAN) 150 MG tablet Take 1 tablet (150 mg total) by mouth daily for 1 day. 07/30/21 07/31/21 Yes Al DecantGroce, Berklie Dethlefs F, PA-C  ?ALPRAZolam (XANAX) 1 MG tablet Take 1 mg by mouth 3 (three) times daily as needed for anxiety.    [provider]  ?cyclobenzaprine (FLEXERIL) 5 MG tablet Take 5 mg by mouth 2 (two) times daily.    [provider]  ?FLUoxetine (PROZAC) 20 MG capsule Take 20 mg by mouth daily.    [provider]  ?gabapentin (NEURONTIN) 300  MG capsule Take 300 mg by mouth 3 (three) times daily.    [provider]  ?hydrochlorothiazide (HYDRODIURIL) 25 MG tablet Take 1 tablet (25 mg total) by mouth daily. 06/29/14   Richarda OverlieAbrol, Nayana, MD  ?HYDROcodone-acetaminophen (NORCO/VICODIN) 5-325 MG tablet Take 1 tablet by mouth every 6 (six) hours as needed. 10/01/20   Placido SouJoldersma, [name], PA-C  ?insulin glargine (LANTUS) 100 UNIT/ML injection Inject 100 Units into the skin at bedtime.     [provider]  ?insulin lispro (HUMALOG) 100 UNIT/ML injection Inject 2-15 Units into the skin daily as needed for high blood sugar. Use per sliding scale when sugar is high but not over 400    [provider]  ?insulin regular (NOVOLIN R,HUMULIN R) 100 units/mL injection Inject 20-35 Units into the skin 3 (three) times daily as needed for high blood sugar. Per sliding scale    [provider]  ?ipratropium-albuterol (DUONEB) 0.5-2.5 (3) MG/3ML SOLN Take 3 mLs by nebulization every 6 (six) hours as needed.    [provider]  ?lisinopril (PRINIVIL,ZESTRIL) 5 MG tablet Take 5 mg by mouth daily.    [provider]  ?metFORMIN (GLUCOPHAGE) 850 MG tablet Take 850 mg by mouth 2 (two) times daily with a meal.    [provider]  ?metFORMIN (GLUMETZA) 1000 MG (MOD) 24 hr tablet Take 1,000 mg by mouth 3 (three) times daily.  [provider]  ?naproxen (NAPROSYN) 500 MG tablet Take 500 mg by mouth 2 (two) times daily with a meal.    [provider]  ?pantoprazole (PROTONIX) 40 MG tablet Take 40 mg by mouth daily.    [provider]  ?Potassium Chloride Crys CR (K-DUR PO) Take 20 mEq by mouth daily.     [provider]  ?prochlorperazine (COMPAZINE) 10 MG tablet Take 1 tablet (10 mg total) by mouth 2 (two) times daily as needed for up to 10 doses for nausea or vomiting. 08/02/20   Trifan, Kermit Balo, MD  ?promethazine (PHENERGAN) 12.5 MG tablet Take 1 tablet (12.5 mg total) by mouth every 6 (six)  hours as needed for nausea or vomiting. 06/25/14   Richarda Overlie, MD  ?topiramate (TOPAMAX) 25 MG tablet Take 25 mg by mouth daily.    [provider]  ?   ? ?Allergies    ?Morphine and related, Ciprofloxacin, Levaquin [levofloxacin in d5w], Soma [carisoprodol], and Lasix [furosemide]   ? ?Review of Systems   ?Review of Systems  ?Constitutional:  Positive for chills and fever.  ?Gastrointestinal:  Positive for nausea and vomiting.  ?Skin:  Positive for wound.  ?All other systems reviewed and are negative. ? ?Physical Exam ?Updated Vital Signs ?BP 129/70   Pulse 81   Temp 98.4 ?F (36.9 ?C) (Oral)   Resp 16   Ht 5\' 2"  (1.575 m)   Wt 73.5 kg   SpO2 96%   BMI 29.63 kg/m?  ?Physical Exam ?Vitals and nursing note reviewed.  ?Constitutional:   ?   General: She is not in acute distress. ?   Appearance: Normal appearance. She is not ill-appearing, toxic-appearing or diaphoretic.  ?HENT:  ?   Head: Normocephalic and atraumatic.  ?   Nose: Nose normal. No congestion.  ?   Mouth/Throat:  ?   Mouth: Mucous membranes are moist.  ?   Pharynx: Oropharynx is clear.  ?Eyes:  ?   Extraocular Movements: Extraocular movements intact.  ?   Conjunctiva/sclera: Conjunctivae normal.  ?   Pupils: Pupils are equal, round, and reactive to light.  ?Cardiovascular:  ?   Rate and Rhythm: Normal rate and regular rhythm.  ?   Pulses:     ?     Dorsalis pedis pulses are detected w/ Doppler on the right side and detected w/ Doppler on the left side.  ?Pulmonary:  ?   Effort: Pulmonary effort is normal.  ?   Breath sounds: Normal breath sounds.  ?Abdominal:  ?   General: Abdomen is flat. Bowel sounds are normal.  ?   Palpations: Abdomen is soft.  ?   Tenderness: There is no abdominal tenderness.  ?Musculoskeletal:  ?   Cervical back: Normal range of motion and neck supple. No tenderness.  ?   Comments: Amputated left fifth toe  ?Skin: ?   General: Skin is warm and dry.  ?   Capillary Refill: Capillary refill takes less than 2 seconds.  ?    Comments: Erythematous, purulent drainage noted from amputated left fifth toe.  ?Neurological:  ?   Mental Status: She is alert and oriented to person, place, and time.  ? ? ? ? ? ?ED Results / Procedures / Treatments   ?Labs ?(all labs ordered are listed, but only abnormal results are displayed) ?Labs Reviewed  ?CBC - Abnormal; Notable for the following components:  ?    Result Value  ? RBC 3.48 (*)   ? Hemoglobin  9.9 (*)   ? HCT 31.3 (*)   ? Platelets 420 (*)   ? All other components within normal limits  ?BASIC METABOLIC PANEL - Abnormal; Notable for the following components:  ? Glucose, Bld 160 (*)   ? Calcium 8.8 (*)   ? All other components within normal limits  ? ? ?EKG ?None ? ?Radiology ?DG Foot Complete Left ? ?Result Date: 07/30/2021 ?CLINICAL DATA:  History of recent amputation of the fifth left toe, presenting with green/yellow pus coming out of the wound and pain in the left calf. EXAM: LEFT FOOT - COMPLETE 3+ VIEW COMPARISON:  June 18, 2021 FINDINGS: There is evidence of surgical amputation of the fifth left toe. There is no evidence of an acute fracture or dislocation. A chronic deformity is seen involving the base of the fifth left metatarsal. There is no evidence of arthropathy or other focal bone abnormality. Soft tissues along the surgical site are unremarkable. IMPRESSION: Status post surgical amputation of the fifth left toe. Electronically Signed   By: Aram Candela M.D.   On: 07/30/2021 21:09   ? ?Procedures ?Procedures  ? ? ?Medications Ordered in ED ?Medications  ?vancomycin (VANCOCIN) IVPB 1000 mg/200 mL premix (0 mg Intravenous Stopped 07/30/21 2203)  ?fentaNYL (SUBLIMAZE) injection 50 mcg (50 mcg Intravenous Given 07/30/21 2035)  ? ? ?ED Course/ Medical Decision Making/ A&P ?  ?                        ?Medical Decision Making ?Amount and/or Complexity of Data Reviewed ?Labs: ordered. ?Radiology: ordered. ? ?Risk ?Prescription drug management. ? ? ?66 year old female presents to ED for  evaluation of left fifth toe infection.  Please see HPI for further details. ? ?On examination, the patient is afebrile, nontachycardic, nontoxic-appearing.  Patient lungs are clear bilaterally, abdomen soft c

## 2021-07-30 NOTE — ED Triage Notes (Signed)
Patient BIB [facility] from home. Amputation of pinky toe on left foot march 27th. Stitches taken out 2 weeks ago. Has green/ yellow pus coming out of wound and pain in left calf. Did not take initial set of antibiotics after her surgery. Recently moved, and then decided to start taking antibiotics 3 days ago.  ? ?EMS ?20G right forearm ?4mg  zofran ?

## 2021-07-30 NOTE — Discharge Instructions (Signed)
Return to ED with any new signs or symptom such as fevers, body aches or chills, increased drainage from amputation site ?Please follow-up with your podiatrist for further management and care of amputation ?Please follow-up with your vascular surgeon for further management of your claudication symptoms ?Please begin taking the doxycycline I prescribed you.  You will take this for the next 7 days twice daily ?

## 2021-08-04 DIAGNOSIS — F419 Anxiety disorder, unspecified: Secondary | ICD-10-CM | POA: Insufficient documentation

## 2021-08-04 DIAGNOSIS — D75839 Thrombocytosis, unspecified: Secondary | ICD-10-CM | POA: Insufficient documentation

## 2021-08-04 DIAGNOSIS — E785 Hyperlipidemia, unspecified: Secondary | ICD-10-CM | POA: Insufficient documentation

## 2021-08-11 NOTE — Progress Notes (Signed)
Anna [name], Anna [name] ([id number]) Visit Report for 08/14/2021 Allergy List Details Patient Name: Date of Service: Anna [name], Anna [name] 08/14/2021 8:30 A M Medical Record Number: [id number] Patient Account Number: [id number] Date of Birth/Sex: Treating RN: 11-17-1955 (66 y.o. Toniann Fail Primary Care Jalaysha Skilton: Other Clinician: Referring Zoi Devine: Treating Earlie Schank/Extender: Tilda Franco in Treatment: 0 Allergies Active Allergies Opioids - Morphine Analogues Reaction: Hives Severity: Moderate Active: 05/06/2011 ciprofloxacin Reaction: tongue swells Active: 11/01/2012 Levaquin Active: 06/24/2014 carisoprodol Reaction: sleepy and constipation Active: 11/01/2012 Lasix Severity: Mild Active: 06/24/2014 Allergy Notes Electronic Signature(s) Signed: 08/11/2021 12:15:01 PM By: Haywood Pao CHT EMT BS , , Entered By: Haywood Pao on 08/11/2021 12:15:01

## 2021-08-14 ENCOUNTER — Encounter (HOSPITAL_BASED_OUTPATIENT_CLINIC_OR_DEPARTMENT_OTHER): Payer: No Typology Code available for payment source | Admitting: Internal Medicine

## 2021-08-19 ENCOUNTER — Emergency Department (HOSPITAL_COMMUNITY): Payer: No Typology Code available for payment source

## 2021-08-19 ENCOUNTER — Inpatient Hospital Stay (HOSPITAL_COMMUNITY)
Admission: EM | Admit: 2021-08-19 | Discharge: 2021-08-27 | DRG: 475 | Disposition: A | Discharge status: Home Health | Payer: No Typology Code available for payment source | Attending: Internal Medicine | Admitting: Internal Medicine

## 2021-08-19 ENCOUNTER — Encounter (HOSPITAL_COMMUNITY): Payer: Self-pay | Admitting: Emergency Medicine

## 2021-08-19 ENCOUNTER — Other Ambulatory Visit: Payer: Self-pay

## 2021-08-19 DIAGNOSIS — Z7984 Long term (current) use of oral hypoglycemic drugs: Secondary | ICD-10-CM

## 2021-08-19 DIAGNOSIS — E1152 Type 2 diabetes mellitus with diabetic peripheral angiopathy with gangrene: Secondary | ICD-10-CM | POA: Diagnosis present

## 2021-08-19 DIAGNOSIS — E669 Obesity, unspecified: Secondary | ICD-10-CM | POA: Diagnosis present

## 2021-08-19 DIAGNOSIS — I160 Hypertensive urgency: Secondary | ICD-10-CM | POA: Diagnosis present

## 2021-08-19 DIAGNOSIS — Z794 Long term (current) use of insulin: Secondary | ICD-10-CM

## 2021-08-19 DIAGNOSIS — I2510 Atherosclerotic heart disease of native coronary artery without angina pectoris: Secondary | ICD-10-CM | POA: Diagnosis present

## 2021-08-19 DIAGNOSIS — I70223 Atherosclerosis of native arteries of extremities with rest pain, bilateral legs: Secondary | ICD-10-CM | POA: Diagnosis not present

## 2021-08-19 DIAGNOSIS — F419 Anxiety disorder, unspecified: Secondary | ICD-10-CM | POA: Diagnosis present

## 2021-08-19 DIAGNOSIS — L089 Local infection of the skin and subcutaneous tissue, unspecified: Secondary | ICD-10-CM | POA: Diagnosis not present

## 2021-08-19 DIAGNOSIS — I739 Peripheral vascular disease, unspecified: Secondary | ICD-10-CM | POA: Diagnosis not present

## 2021-08-19 DIAGNOSIS — K219 Gastro-esophageal reflux disease without esophagitis: Secondary | ICD-10-CM | POA: Diagnosis present

## 2021-08-19 DIAGNOSIS — K760 Fatty (change of) liver, not elsewhere classified: Secondary | ICD-10-CM | POA: Diagnosis present

## 2021-08-19 DIAGNOSIS — Z79899 Other long term (current) drug therapy: Secondary | ICD-10-CM

## 2021-08-19 DIAGNOSIS — Y838 Other surgical procedures as the cause of abnormal reaction of the patient, or of later complication, without mention of misadventure at the time of the procedure: Secondary | ICD-10-CM | POA: Diagnosis present

## 2021-08-19 DIAGNOSIS — R471 Dysarthria and anarthria: Secondary | ICD-10-CM | POA: Diagnosis not present

## 2021-08-19 DIAGNOSIS — Z6829 Body mass index (BMI) 29.0-29.9, adult: Secondary | ICD-10-CM | POA: Diagnosis not present

## 2021-08-19 DIAGNOSIS — E1165 Type 2 diabetes mellitus with hyperglycemia: Secondary | ICD-10-CM | POA: Diagnosis present

## 2021-08-19 DIAGNOSIS — G4733 Obstructive sleep apnea (adult) (pediatric): Secondary | ICD-10-CM | POA: Diagnosis present

## 2021-08-19 DIAGNOSIS — Z9049 Acquired absence of other specified parts of digestive tract: Secondary | ICD-10-CM

## 2021-08-19 DIAGNOSIS — G2581 Restless legs syndrome: Secondary | ICD-10-CM | POA: Diagnosis present

## 2021-08-19 DIAGNOSIS — T148XXA Other injury of unspecified body region, initial encounter: Secondary | ICD-10-CM

## 2021-08-19 DIAGNOSIS — Z86718 Personal history of other venous thrombosis and embolism: Secondary | ICD-10-CM

## 2021-08-19 DIAGNOSIS — L97529 Non-pressure chronic ulcer of other part of left foot with unspecified severity: Secondary | ICD-10-CM | POA: Diagnosis present

## 2021-08-19 DIAGNOSIS — Z9989 Dependence on other enabling machines and devices: Secondary | ICD-10-CM

## 2021-08-19 DIAGNOSIS — L0390 Cellulitis, unspecified: Secondary | ICD-10-CM | POA: Diagnosis present

## 2021-08-19 DIAGNOSIS — E11628 Type 2 diabetes mellitus with other skin complications: Secondary | ICD-10-CM | POA: Diagnosis present

## 2021-08-19 DIAGNOSIS — Z881 Allergy status to other antibiotic agents status: Secondary | ICD-10-CM

## 2021-08-19 DIAGNOSIS — E875 Hyperkalemia: Secondary | ICD-10-CM | POA: Diagnosis present

## 2021-08-19 DIAGNOSIS — L03116 Cellulitis of left lower limb: Secondary | ICD-10-CM | POA: Diagnosis present

## 2021-08-19 DIAGNOSIS — E11621 Type 2 diabetes mellitus with foot ulcer: Secondary | ICD-10-CM | POA: Diagnosis present

## 2021-08-19 DIAGNOSIS — T8744 Infection of amputation stump, left lower extremity: Principal | ICD-10-CM | POA: Diagnosis present

## 2021-08-19 DIAGNOSIS — I70245 Atherosclerosis of native arteries of left leg with ulceration of other part of foot: Secondary | ICD-10-CM | POA: Diagnosis not present

## 2021-08-19 DIAGNOSIS — Z89422 Acquired absence of other left toe(s): Secondary | ICD-10-CM

## 2021-08-19 DIAGNOSIS — Z885 Allergy status to narcotic agent status: Secondary | ICD-10-CM

## 2021-08-19 DIAGNOSIS — Z86711 Personal history of pulmonary embolism: Secondary | ICD-10-CM

## 2021-08-19 DIAGNOSIS — I10 Essential (primary) hypertension: Secondary | ICD-10-CM | POA: Diagnosis present

## 2021-08-19 DIAGNOSIS — E785 Hyperlipidemia, unspecified: Secondary | ICD-10-CM | POA: Diagnosis present

## 2021-08-19 DIAGNOSIS — E11319 Type 2 diabetes mellitus with unspecified diabetic retinopathy without macular edema: Secondary | ICD-10-CM | POA: Diagnosis present

## 2021-08-19 DIAGNOSIS — I96 Gangrene, not elsewhere classified: Secondary | ICD-10-CM | POA: Diagnosis present

## 2021-08-19 DIAGNOSIS — E1142 Type 2 diabetes mellitus with diabetic polyneuropathy: Secondary | ICD-10-CM | POA: Diagnosis present

## 2021-08-19 DIAGNOSIS — Z888 Allergy status to other drugs, medicaments and biological substances status: Secondary | ICD-10-CM

## 2021-08-19 DIAGNOSIS — I351 Nonrheumatic aortic (valve) insufficiency: Secondary | ICD-10-CM | POA: Diagnosis not present

## 2021-08-19 DIAGNOSIS — J449 Chronic obstructive pulmonary disease, unspecified: Secondary | ICD-10-CM | POA: Diagnosis present

## 2021-08-19 DIAGNOSIS — E119 Type 2 diabetes mellitus without complications: Secondary | ICD-10-CM

## 2021-08-19 DIAGNOSIS — E1140 Type 2 diabetes mellitus with diabetic neuropathy, unspecified: Secondary | ICD-10-CM | POA: Diagnosis present

## 2021-08-19 DIAGNOSIS — I340 Nonrheumatic mitral (valve) insufficiency: Secondary | ICD-10-CM | POA: Diagnosis not present

## 2021-08-19 DIAGNOSIS — I361 Nonrheumatic tricuspid (valve) insufficiency: Secondary | ICD-10-CM | POA: Diagnosis not present

## 2021-08-19 DIAGNOSIS — Z8673 Personal history of transient ischemic attack (TIA), and cerebral infarction without residual deficits: Secondary | ICD-10-CM

## 2021-08-19 DIAGNOSIS — D638 Anemia in other chronic diseases classified elsewhere: Secondary | ICD-10-CM | POA: Diagnosis present

## 2021-08-19 LAB — CBC WITH DIFFERENTIAL/PLATELET
Abs Immature Granulocytes: 0.03 10*3/uL (ref 0.00–0.07)
Basophils Absolute: 0.1 10*3/uL (ref 0.0–0.1)
Basophils Relative: 1 %
Eosinophils Absolute: 0.2 10*3/uL (ref 0.0–0.5)
Eosinophils Relative: 2 %
HCT: 35.4 % — ABNORMAL LOW (ref 36.0–46.0)
Hemoglobin: 11.3 g/dL — ABNORMAL LOW (ref 12.0–15.0)
Immature Granulocytes: 0 %
Lymphocytes Relative: 31 %
Lymphs Abs: 3.6 10*3/uL (ref 0.7–4.0)
MCH: 28.2 pg (ref 26.0–34.0)
MCHC: 31.9 g/dL (ref 30.0–36.0)
MCV: 88.3 fL (ref 80.0–100.0)
Monocytes Absolute: 0.8 10*3/uL (ref 0.1–1.0)
Monocytes Relative: 7 %
Neutro Abs: 6.8 10*3/uL (ref 1.7–7.7)
Neutrophils Relative %: 59 %
Platelets: 393 10*3/uL (ref 150–400)
RBC: 4.01 MIL/uL (ref 3.87–5.11)
RDW: 13.7 % (ref 11.5–15.5)
WBC: 11.6 10*3/uL — ABNORMAL HIGH (ref 4.0–10.5)
nRBC: 0 % (ref 0.0–0.2)

## 2021-08-19 LAB — COMPREHENSIVE METABOLIC PANEL
ALT: 14 U/L (ref 0–44)
AST: 25 U/L (ref 15–41)
Albumin: 3.5 g/dL (ref 3.5–5.0)
Alkaline Phosphatase: 51 U/L (ref 38–126)
Anion gap: 12 (ref 5–15)
BUN: 15 mg/dL (ref 8–23)
CO2: 20 mmol/L — ABNORMAL LOW (ref 22–32)
Calcium: 9 mg/dL (ref 8.9–10.3)
Chloride: 102 mmol/L (ref 98–111)
Creatinine, Ser: 0.95 mg/dL (ref 0.44–1.00)
GFR, Estimated: >60 mL/min (ref >60)
Glucose, Bld: 244 mg/dL — ABNORMAL HIGH (ref 70–99)
Potassium: 5.2 mmol/L — ABNORMAL HIGH (ref 3.5–5.1)
Sodium: 134 mmol/L — ABNORMAL LOW (ref 135–145)
Total Bilirubin: 0.6 mg/dL (ref 0.3–1.2)
Total Protein: 6.9 g/dL (ref 6.5–8.1)

## 2021-08-19 LAB — URINALYSIS, COMPLETE (UACMP) WITH MICROSCOPIC
Bacteria, UA: NONE SEEN
Bilirubin Urine: NEGATIVE
Glucose, UA: NEGATIVE mg/dL
Hgb urine dipstick: NEGATIVE
Ketones, ur: NEGATIVE mg/dL
Leukocytes,Ua: NEGATIVE
Nitrite: NEGATIVE
Protein, ur: NEGATIVE mg/dL
Specific Gravity, Urine: 1.011 (ref 1.005–1.030)
pH: 6 (ref 5.0–8.0)

## 2021-08-19 LAB — LACTIC ACID, PLASMA: Lactic Acid, Venous: 1.2 mmol/L (ref 0.5–1.9)

## 2021-08-19 LAB — SEDIMENTATION RATE: Sed Rate: 37 mm/hr — ABNORMAL HIGH (ref 0–22)

## 2021-08-19 LAB — C-REACTIVE PROTEIN: CRP: <0.5 mg/dL (ref <1.0)

## 2021-08-19 LAB — CBG MONITORING, ED: Glucose-Capillary: 151 mg/dL — ABNORMAL HIGH (ref 70–99)

## 2021-08-19 MED ORDER — POLYETHYLENE GLYCOL 3350 17 G PO PACK: 17.0000 g | PACK | Freq: Every day | ORAL | Status: DC | PRN

## 2021-08-19 MED ORDER — LORATADINE 10 MG PO TABS: 10.0000 mg | ORAL_TABLET | Freq: Every day | ORAL | Status: DC | PRN

## 2021-08-19 MED ORDER — SODIUM CHLORIDE 0.9 % IV SOLN
75.0000 mL/h | INTRAVENOUS | Status: AC
  Administered 2021-08-19: 75 mL/h via INTRAVENOUS

## 2021-08-19 MED ORDER — VANCOMYCIN HCL IN DEXTROSE 1-5 GM/200ML-% IV SOLN
1000.0000 mg | INTRAVENOUS | Status: DC
  Administered 2021-08-20 – 2021-08-26 (×7): 1000 mg via INTRAVENOUS
  Filled 2021-08-19 (×8): qty 200

## 2021-08-19 MED ORDER — FLUOXETINE HCL 20 MG PO CAPS
80.0000 mg | ORAL_CAPSULE | Freq: Every day | ORAL | Status: DC
  Administered 2021-08-20 – 2021-08-27 (×7): 80 mg via ORAL
  Filled 2021-08-19 (×7): qty 4

## 2021-08-19 MED ORDER — DOCUSATE SODIUM 100 MG PO CAPS
100.0000 mg | ORAL_CAPSULE | Freq: Two times a day (BID) | ORAL | Status: DC
  Administered 2021-08-20 – 2021-08-27 (×13): 100 mg via ORAL
  Filled 2021-08-19 (×14): qty 1

## 2021-08-19 MED ORDER — SODIUM CHLORIDE 0.9 % IV SOLN
2.0000 g | Freq: Three times a day (TID) | INTRAVENOUS | Status: DC
  Administered 2021-08-19 – 2021-08-27 (×22): 2 g via INTRAVENOUS
  Filled 2021-08-19 (×22): qty 12.5

## 2021-08-19 MED ORDER — INSULIN GLARGINE-YFGN 100 UNIT/ML ~~LOC~~ SOLN
15.0000 [IU] | Freq: Every day | SUBCUTANEOUS | Status: DC
  Administered 2021-08-19 – 2021-08-23 (×5): 15 [IU] via SUBCUTANEOUS
  Filled 2021-08-19 (×7): qty 0.15

## 2021-08-19 MED ORDER — ACETAMINOPHEN 650 MG RE SUPP: 650.0000 mg | Freq: Four times a day (QID) | RECTAL | Status: DC | PRN

## 2021-08-19 MED ORDER — ROPINIROLE HCL 0.25 MG PO TABS
0.2500 mg | ORAL_TABLET | Freq: Every day | ORAL | Status: DC
  Administered 2021-08-19 – 2021-08-26 (×8): 0.25 mg via ORAL
  Filled 2021-08-19 (×12): qty 1

## 2021-08-19 MED ORDER — METRONIDAZOLE 500 MG/100ML IV SOLN
500.0000 mg | Freq: Two times a day (BID) | INTRAVENOUS | Status: AC
  Administered 2021-08-19 – 2021-08-25 (×13): 500 mg via INTRAVENOUS
  Filled 2021-08-19 (×13): qty 100

## 2021-08-19 MED ORDER — ACETAMINOPHEN 325 MG PO TABS: 650.0000 mg | ORAL_TABLET | Freq: Four times a day (QID) | ORAL | Status: DC | PRN

## 2021-08-19 MED ORDER — PANTOPRAZOLE SODIUM 40 MG PO TBEC
40.0000 mg | DELAYED_RELEASE_TABLET | Freq: Every day | ORAL | Status: DC
  Administered 2021-08-20 – 2021-08-27 (×7): 40 mg via ORAL
  Filled 2021-08-19 (×7): qty 1

## 2021-08-19 MED ORDER — ALBUTEROL SULFATE (2.5 MG/3ML) 0.083% IN NEBU: 2.5000 mg | INHALATION_SOLUTION | RESPIRATORY_TRACT | Status: DC | PRN

## 2021-08-19 MED ORDER — METHOCARBAMOL 1000 MG/10ML IJ SOLN
500.0000 mg | Freq: Four times a day (QID) | INTRAVENOUS | Status: DC | PRN
  Administered 2021-08-20: 500 mg via INTRAVENOUS
  Filled 2021-08-19: qty 5

## 2021-08-19 MED ORDER — GUAIFENESIN ER 600 MG PO TB12
600.0000 mg | ORAL_TABLET | Freq: Two times a day (BID) | ORAL | Status: DC
  Administered 2021-08-19 – 2021-08-27 (×15): 600 mg via ORAL
  Filled 2021-08-19 (×15): qty 1

## 2021-08-19 MED ORDER — HYDRALAZINE HCL 20 MG/ML IJ SOLN: 10.0000 mg | INTRAMUSCULAR | Status: DC | PRN

## 2021-08-19 MED ORDER — NYSTATIN 100000 UNIT/GM EX POWD
Freq: Three times a day (TID) | CUTANEOUS | Status: DC
  Filled 2021-08-19: qty 15

## 2021-08-19 MED ORDER — IPRATROPIUM-ALBUTEROL 0.5-2.5 (3) MG/3ML IN SOLN: 3.0000 mL | Freq: Four times a day (QID) | RESPIRATORY_TRACT | Status: DC | PRN

## 2021-08-19 MED ORDER — ROSUVASTATIN CALCIUM 20 MG PO TABS
20.0000 mg | ORAL_TABLET | Freq: Every day | ORAL | Status: DC
  Administered 2021-08-20 – 2021-08-23 (×3): 20 mg via ORAL
  Filled 2021-08-19 (×3): qty 1

## 2021-08-19 MED ORDER — SENNA 8.6 MG PO TABS
1.0000 | ORAL_TABLET | Freq: Two times a day (BID) | ORAL | Status: DC
  Administered 2021-08-20 – 2021-08-27 (×12): 8.6 mg via ORAL
  Filled 2021-08-19 (×14): qty 1

## 2021-08-19 MED ORDER — TRAZODONE HCL 50 MG PO TABS
25.0000 mg | ORAL_TABLET | Freq: Every evening | ORAL | Status: DC | PRN
  Filled 2021-08-19: qty 1

## 2021-08-19 MED ORDER — INSULIN ASPART 100 UNIT/ML IJ SOLN
0.0000 [IU] | INTRAMUSCULAR | Status: DC
  Administered 2021-08-19 – 2021-08-20 (×4): 2 [IU] via SUBCUTANEOUS
  Administered 2021-08-20: 1 [IU] via SUBCUTANEOUS
  Administered 2021-08-20: 2 [IU] via SUBCUTANEOUS
  Administered 2021-08-20 – 2021-08-21 (×2): 1 [IU] via SUBCUTANEOUS

## 2021-08-19 MED ORDER — DOCUSATE SODIUM 100 MG PO CAPS
100.0000 mg | ORAL_CAPSULE | Freq: Two times a day (BID) | ORAL | Status: DC | PRN
  Filled 2021-08-19: qty 1

## 2021-08-19 MED ORDER — HYDROCODONE-ACETAMINOPHEN 5-325 MG PO TABS
1.0000 | ORAL_TABLET | ORAL | Status: DC | PRN
  Administered 2021-08-19 – 2021-08-20 (×2): 1 via ORAL
  Administered 2021-08-20 (×2): 2 via ORAL
  Administered 2021-08-21 (×5): 1 via ORAL
  Administered 2021-08-22 (×2): 2 via ORAL
  Administered 2021-08-22 – 2021-08-23 (×2): 1 via ORAL
  Administered 2021-08-23 (×2): 2 via ORAL
  Administered 2021-08-24 (×2): 1 via ORAL
  Administered 2021-08-24 – 2021-08-25 (×4): 2 via ORAL
  Administered 2021-08-25: 1 via ORAL
  Administered 2021-08-25 – 2021-08-26 (×2): 2 via ORAL
  Administered 2021-08-26: 1 via ORAL
  Administered 2021-08-26 – 2021-08-27 (×4): 2 via ORAL
  Administered 2021-08-27: 1 via ORAL
  Filled 2021-08-19 (×5): qty 2
  Filled 2021-08-19: qty 1
  Filled 2021-08-19 (×3): qty 2
  Filled 2021-08-19: qty 1
  Filled 2021-08-19 (×2): qty 2
  Filled 2021-08-19 (×4): qty 1
  Filled 2021-08-19 (×2): qty 2
  Filled 2021-08-19: qty 1
  Filled 2021-08-19: qty 2
  Filled 2021-08-19: qty 1
  Filled 2021-08-19: qty 2
  Filled 2021-08-19: qty 1
  Filled 2021-08-19 (×3): qty 2
  Filled 2021-08-19 (×2): qty 1
  Filled 2021-08-19 (×2): qty 2
  Filled 2021-08-19: qty 1
  Filled 2021-08-19: qty 2

## 2021-08-19 MED ORDER — SODIUM CHLORIDE 0.9 % IV SOLN
2.0000 g | Freq: Once | INTRAVENOUS | Status: AC
  Administered 2021-08-19: 2 g via INTRAVENOUS
  Filled 2021-08-19: qty 12.5

## 2021-08-19 MED ORDER — ALPRAZOLAM 0.5 MG PO TABS
1.0000 mg | ORAL_TABLET | Freq: Three times a day (TID) | ORAL | Status: DC | PRN
  Administered 2021-08-22: 1 mg via ORAL
  Administered 2021-08-22: 0.5 mg via ORAL
  Administered 2021-08-23 – 2021-08-27 (×6): 1 mg via ORAL
  Filled 2021-08-19 (×13): qty 2

## 2021-08-19 MED ORDER — VANCOMYCIN HCL 1500 MG/300ML IV SOLN
1500.0000 mg | Freq: Once | INTRAVENOUS | Status: AC
  Administered 2021-08-19: 1500 mg via INTRAVENOUS
  Filled 2021-08-19: qty 300

## 2021-08-19 MED ORDER — BUSPIRONE HCL 5 MG PO TABS
25.0000 mg | ORAL_TABLET | Freq: Two times a day (BID) | ORAL | Status: DC
  Administered 2021-08-19 – 2021-08-27 (×15): 25 mg via ORAL
  Filled 2021-08-19 (×4): qty 1
  Filled 2021-08-19: qty 5
  Filled 2021-08-19 (×2): qty 1
  Filled 2021-08-19: qty 5
  Filled 2021-08-19: qty 1
  Filled 2021-08-19 (×3): qty 5
  Filled 2021-08-19 (×3): qty 1

## 2021-08-19 MED ORDER — TOPIRAMATE 25 MG PO TABS
25.0000 mg | ORAL_TABLET | Freq: Every day | ORAL | Status: DC
  Administered 2021-08-20 – 2021-08-27 (×7): 25 mg via ORAL
  Filled 2021-08-19 (×8): qty 1

## 2021-08-19 NOTE — Assessment & Plan Note (Signed)
cPAP ordered

## 2021-08-19 NOTE — Assessment & Plan Note (Signed)
-   Order Sensitive SSI   - continue home insulin regimen decrease while NPO  -  check TSH and HgA1C  - Hold by mouth medications  diabetes coordinator consult in AM

## 2021-08-19 NOTE — ED Provider Notes (Signed)
[facility] EMERGENCY DEPARTMENT Provider Note   CSN: [id number] Arrival date & time: 08/19/21  1219     History  Chief Complaint  Patient presents with   L foot infection    Anna [name] is a 66 y.o. female.  66 year old female presents with concern for worsening left foot infection.  Reports amputation of her left fifth toe on March 24 by Dr. Hal Neer (podiatry) with [facility] in [place].  Patient did not take her antibiotics after discharge due to social complications.  Patient presented to this emergency room on May 7 with concern for infection after taking 3 days of her doxycycline.  Patient was given 1 dose of IV vancomycin and discharged with plan to follow-up with wound care.  Patient had transportation issues and was unable to follow-up.  Presents with concern for worsening pain in her foot which typically resolves with hanging her foot off the side of the bed and swinging it back and forth.  Also concern for worsening black scab at the site of her amputation.  She is afebrile. Past medical history significant for diabetic neuropathy, diabetic retinopathy, hypertension, hyperlipidemia, diabetes.      Home Medications Prior to Admission medications   Medication Sig Start Date End Date Taking? Authorizing Provider  ALPRAZolam Prudy Feeler) 1 MG tablet Take 1 mg by mouth 3 (three) times daily as needed for anxiety.    [provider]  cyclobenzaprine (FLEXERIL) 5 MG tablet Take 5 mg by mouth 2 (two) times daily.    [provider]  doxycycline (VIBRAMYCIN) 100 MG capsule Take 1 capsule (100 mg total) by mouth 2 (two) times daily. 07/30/21   Al Decant, PA-C  FLUoxetine (PROZAC) 20 MG capsule Take 20 mg by mouth daily.    [provider]  gabapentin (NEURONTIN) 300 MG capsule Take 300 mg by mouth 3 (three) times daily.    [provider]  hydrochlorothiazide (HYDRODIURIL) 25 MG tablet Take 1 tablet (25 mg total) by  mouth daily. 06/29/14   Richarda Overlie, MD  HYDROcodone-acetaminophen (NORCO/VICODIN) 5-325 MG tablet Take 1 tablet by mouth every 6 (six) hours as needed. 10/01/20   Placido Sou, PA-C  insulin glargine (LANTUS) 100 UNIT/ML injection Inject 100 Units into the skin at bedtime.     [provider]  insulin lispro (HUMALOG) 100 UNIT/ML injection Inject 2-15 Units into the skin daily as needed for high blood sugar. Use per sliding scale when sugar is high but not over 400    [provider]  insulin regular (NOVOLIN R,HUMULIN R) 100 units/mL injection Inject 20-35 Units into the skin 3 (three) times daily as needed for high blood sugar. Per sliding scale    [provider]  ipratropium-albuterol (DUONEB) 0.5-2.5 (3) MG/3ML SOLN Take 3 mLs by nebulization every 6 (six) hours as needed.    [provider]  lisinopril (PRINIVIL,ZESTRIL) 5 MG tablet Take 5 mg by mouth daily.    [provider]  metFORMIN (GLUCOPHAGE) 850 MG tablet Take 850 mg by mouth 2 (two) times daily with a meal.    [provider]  metFORMIN (GLUMETZA) 1000 MG (MOD) 24 hr tablet Take 1,000 mg by mouth 3 (three) times daily.    [provider]  naproxen (NAPROSYN) 500 MG tablet Take 500 mg by mouth 2 (two) times daily with a meal.    [provider]  pantoprazole (PROTONIX) 40 MG tablet Take 40 mg by mouth daily.    [provider]  Potassium Chloride Crys CR (K-DUR PO) Take 20 mEq by mouth daily.     [provider]  prochlorperazine (COMPAZINE) 10 MG tablet Take 1 tablet (10 mg total) by mouth 2 (two) times daily as needed for up to 10 doses for nausea or vomiting. 08/02/20   Trifan, Kermit BaloMatthew J, MD  promethazine (PHENERGAN) 12.5 MG tablet Take 1 tablet (12.5 mg total) by mouth every 6 (six) hours as needed for nausea or vomiting. 06/25/14   Richarda OverlieAbrol, Nayana, MD  topiramate (TOPAMAX) 25 MG tablet Take 25 mg by mouth daily.    [provider]       Allergies    Morphine and related, Ciprofloxacin, Levaquin [levofloxacin in d5w], Soma [carisoprodol], and Lasix [furosemide]    Review of Systems   Review of Systems Negative except as per HPI Physical Exam Updated Vital Signs BP (!) 167/73   Pulse 78   Temp 98.1 F (36.7 C) (Oral)   Resp 18   SpO2 99%  Physical Exam Vitals and nursing note reviewed.  Constitutional:      General: She is not in acute distress.    Appearance: She is well-developed. She is not diaphoretic.  HENT:     Head: Normocephalic and atraumatic.  Cardiovascular:     Pulses: Normal pulses.  Pulmonary:     Effort: Pulmonary effort is normal.  Musculoskeletal:        General: Swelling and tenderness present.     Comments: Eschar to left fifth toe MTP amputation site, worse compared to prior photo on file, with surrounding erythema, no drainage.  Does have foul odor. DP and PT pulses present with bedside Doppler and marked. Additional eschar noted to medial aspect of left great toe which patient states has been present for the past year.  Skin:    General: Skin is warm and dry.     Findings: Erythema present.  Neurological:     Mental Status: She is alert and oriented to person, place, and time.  Psychiatric:        Behavior: Behavior normal.           ED Results / Procedures / Treatments   Labs (all labs ordered are listed, but only abnormal results are displayed) Labs Reviewed  COMPREHENSIVE METABOLIC PANEL - Abnormal; Notable for the following components:      Result Value   Sodium 134 (*)    Potassium 5.2 (*)    CO2 20 (*)    Glucose, Bld 244 (*)    All other components within normal limits  CBC WITH DIFFERENTIAL/PLATELET - Abnormal; Notable for the following components:   WBC 11.6 (*)    Hemoglobin 11.3 (*)    HCT 35.4 (*)    All other components within normal limits  CULTURE, BLOOD (ROUTINE X 2)  CULTURE, BLOOD (ROUTINE X 2)  C-REACTIVE PROTEIN  LACTIC ACID, PLASMA   SEDIMENTATION RATE    EKG None  Radiology DG Foot Complete Left  Result Date: 08/19/2021 CLINICAL DATA:  Left foot infection. History of diabetes and diabetic neuropathy. Patient reports infection to left foot from left pinky toe amputation 06/16/2021. Wound black with redness around it. Pain and chills. EXAM: LEFT FOOT - COMPLETE 3+ VIEW COMPARISON:  Left foot radiographs 07/30/2021 and 08/15/2014 FINDINGS: Postsurgical changes are again seen of amputation of the fifth toe phalanges. There is mildly increased lucency within the soft tissues at the amputation site. No cortical erosion is seen within the adjacent fifth metatarsal  or fourth metatarsal/fourth toe proximal phalangeal bones. No significant osteoarthritis. Unchanged chronic minimally displaced fracture of the base of fifth metatarsal. IMPRESSION: Status post fifth toe phalangeal amputation unchanged. Mildly increased lucency within the soft tissues at the distal amputation site may represent the reported soft tissue infection. No cortical erosion is seen to indicate radiographic evidence of acute osteomyelitis. Electronically Signed   By: Neita Garnet M.D.   On: 08/19/2021 14:27    Procedures Procedures    Medications Ordered in ED Medications  metroNIDAZOLE (FLAGYL) IVPB 500 mg (500 mg Intravenous New Bag/Given 08/19/21 1510)  ceFEPIme (MAXIPIME) 2 g in sodium chloride 0.9 % 100 mL IVPB (has no administration in time range)  ceFEPIme (MAXIPIME) 2 g in sodium chloride 0.9 % 100 mL IVPB (0 g Intravenous Stopped 08/19/21 1603)    ED Course/ Medical Decision Making/ A&P                           Medical Decision Making Amount and/or Complexity of Data Reviewed Labs: ordered. Radiology: ordered.  Risk Prescription drug management.   This patient presents to the ED for concern of worsening diabetic foot infection, this involves an extensive number of treatment options, and is a complaint that carries with it a high risk of  complications and morbidity.  The differential diagnosis includes osteomyelitis, cellulitis   Co morbidities that complicate the patient evaluation  Diabetes   Additional history obtained:  External records from outside source obtained and reviewed including prior x-ray obtained 3 weeks ago which was negative for osteomyelitis   Lab Tests:  I Ordered, and personally interpreted labs.  The pertinent results include: CBC with elevated white count at 11.6, increased, compared to prior 3 weeks ago.  Lactic acid normal at 1.2.  CMP with elevated glucose at 244, not in DKA.  CRP less than 0.5   Imaging Studies ordered:  I ordered imaging studies including x-ray left foot I independently visualized and interpreted imaging which showed no evidence of osteomyelitis I agree with the radiologist interpretation  Consultations Obtained:  I requested consultation with the podiatrist,  and discussed lab and imaging findings as well as pertinent plan - they recommend: Consult pending at time of shift change   Problem List / ED Course / Critical interventions / Medication management  66 year old female with history of diabetes presents with worsening left foot infection.  X-ray negative for osteomyelitis.  White count mildly elevated.  Patient is given antibiotics.  Plan is to discuss with podiatry, consider admission for worsening diabetic foot infection and cellulitis. I ordered medication including antibiotics for diabetic foot infection Reevaluation of the patient after these medicines showed that the patient stayed the same I have reviewed the patients home medicines and have made adjustments as needed   Social Determinants of Health:  Has PCP and out of network podiatry   Test / Admission - Considered:  Consider admission, pending consult with podiatry         Final Clinical Impression(s) / ED Diagnoses Final diagnoses:  Diabetic foot infection [facility])    Rx / DC  Orders ED Discharge Orders     None         Jeannie Fend, PA-C 08/19/21 1610    Horton, Clabe Seal, DO 08/19/21 1705

## 2021-08-19 NOTE — Assessment & Plan Note (Signed)
-  admit per cellulitis protocol will        continue current antibiotic choice      plain films showed: no evidence of air  no evidence of osteomyelitis   no  foreign   objects      Will obtain MRSA screening,       obtain blood cultures if febrile or septic     further antibiotic adjustment pending above results Podiatry is aware make NPO post midnight

## 2021-08-19 NOTE — Subjective & Objective (Signed)
Patient had left pinky toe "on June 16, 2021 at outside facility Dr. Hal Neer (podiatry) with [facility] in [facility] she is supposed to have antibiotics but could not really feel them.  The wound has been getting worse and been getting more red that she has been having some pain and chills. Of note she was seen in the emergency department on 7 May at that time for suspected infection was given a dose of IV vancomycin prescription for doxycycline and was supposed to follow-up with wound care but has been having transportation issues.  Denies any fevers Patient is diabetic on insulin

## 2021-08-19 NOTE — Assessment & Plan Note (Signed)
Mild hold lisinopril repeat labs after rehydration

## 2021-08-19 NOTE — ED Triage Notes (Signed)
Pt reports infection to L foot from L pinky toe amputation on 3/24.  Reports wound black with redness around it, pain, and chills.

## 2021-08-19 NOTE — ED Provider Notes (Signed)
I assumed care of patient at shift change from previous team, please see their note for full H&P.  Briefly patient is here for concern for cellulitis in the left foot.  She had toe amputations recently at outside hospital.   I introduced myself to patient.  She has multiple necrotic appearing ulcers on her left foot.  She otherwise is in no distress.  Plan is to follow-up on podiatry consult, anticipate admission to [facility] for cellulitis  I spoke with Dr. [name] Bores, with podiatry he will see patient tomorrow in consult.   I spoke with [facility].  As patient is a pace patient not of the triad  she isn't admitted by them, instead by unassigned.   I spoke with hospitalist who will see patient for admission.    The patient appears reasonably stabilized for admission considering the current resources, flow, and capabilities available in the ED at this time, and I doubt any other [facility] requiring further screening and/or treatment in the ED prior to admission assuming timely admission and bed placement.  Note: Portions of this report may have been transcribed using voice recognition software. Every effort was made to ensure accuracy; however, inadvertent computerized transcription errors may be present        Anna Gong, PA-C 08/19/21 2039    Horton, Clabe Seal, DO 08/20/21 1259

## 2021-08-19 NOTE — Assessment & Plan Note (Signed)
chronic stable ?

## 2021-08-19 NOTE — Progress Notes (Signed)
Pharmacy Antibiotic Note  Anna [name] is a 66 y.o. female admitted on 08/19/2021 presenting with foot infection.  Pharmacy has been consulted for cefepime dosing.  Plan: Cefepime 2g IV q 8 hours Monitor renal function, Cx and clinical progression to narrow     Temp (24hrs), Avg:98.1 F (36.7 C), Min:98.1 F (36.7 C), Max:98.1 F (36.7 C)  Recent Labs  Lab 08/19/21 1442  WBC 11.6*  CREATININE 0.95  LATICACIDVEN 1.2    CrCl cannot be calculated (Unknown ideal weight.).    Allergies  Allergen Reactions   Morphine And Related Hives   Ciprofloxacin     Tongue swells   Levaquin [Levofloxacin In D5w] Other (See Comments)    Pt does not remember reaction but states she's had issues with med   Soma [Carisoprodol]     Sleepy and constipation   Lasix [Furosemide] Rash   Daylene Posey, PharmD Clinical Pharmacist ED Pharmacist Phone # [phone] 08/19/2021 3:41 PM

## 2021-08-19 NOTE — H&P (Signed)
Anna [name] [id number] DOB: 1955/05/10 DOA: 08/19/2021     PCP: Merryl Hacker, No   Outpatient Specialists:   CARDS: [place]   Endocrinology Mamie Laurel, DO   Patient arrived to ER on 08/19/21 at 1219 Referred by Attending Toy Baker, MD   Patient coming from:    home Lives With family    Chief Complaint:   Chief Complaint  Patient presents with   L foot infection    HPI: Anna [name] is a 66 y.o. female with medical history significant of  DM 2, HTN, HLD peripheral neuropathy, CVA, COPD, CAD, history of DVT in 1980s, history of gastric bleed, GERD, obesity PAD OSA on CPAP    Presented with    worsening left foot swelling and redness Patient had left pinky toe "on June 16, 2021 at outside facility Dr. Gretta Arab (podiatry) [facility] she is supposed to have antibiotics but could not really feel them.  The wound has been getting worse and been getting more red that she has been having some pain and chills. Of note she was seen in the emergency department on 7 May at that time for suspected infection was given a dose of IV vancomycin prescription for doxycycline and was supposed to follow-up with wound care but has been having transportation issues.  Denies any fevers Patient is diabetic on insulin      Regarding pertinent Chronic problems:     Hyperlipidemia - on statins Crestor     HTN on prazosin      CAD  - On Aspirin, statin, betablocker, Plavix                 -     DM 2 - on insulin,        COPD - not  followed by pulmonology   not  on baseline oxygen       OSA -on  CPAP,     Hx of CVA - with residual deficits on Aspirin 81 mg,   Plavix    Hx of DVT/PE on - not on anticoagulation     Chronic anemia - baseline hg Hemoglobin & Hematocrit  Recent Labs    10/01/20 0928 07/30/21 2022 08/19/21 1442  HGB 11.8* 9.9* 11.3*     While in ER:       LEFT foot: Status post fifth toe phalangeal amputation  unchanged. Mildly increased lucency within the soft tissues at the distal amputation site may represent the reported soft tissue infection. No cortical erosion is seen to indicate radiographic evidence of acute osteomyelitis    Following Medications were ordered in ER: Medications  metroNIDAZOLE (FLAGYL) IVPB 500 mg (0 mg Intravenous Stopped 08/19/21 1635)  ceFEPIme (MAXIPIME) 2 g in sodium chloride 0.9 % 100 mL IVPB (has no administration in time range)  ceFEPIme (MAXIPIME) 2 g in sodium chloride 0.9 % 100 mL IVPB (0 g Intravenous Stopped 08/19/21 1603)    _______________________________________________________ ER Provider Called:    Dr. Ruby Cola  They Recommend admit to medicine   Will see in AM Dr. Daylene Katayama is the podiatrist.    ED Triage Vitals  Enc Vitals Group     BP 08/19/21 1228 (!) 171/64     Pulse Rate 08/19/21 1228 81     Resp 08/19/21 1228 18     Temp 08/19/21 1228 98.1 F (36.7 C)     Temp Source 08/19/21 1228 Oral     SpO2 08/19/21 1228  100 %     Weight --      Height --      Head Circumference --      Peak Flow --      Pain Score 08/19/21 1232 10     Pain Loc --      Pain Edu? --      Excl. in [place]? --   TMAX(24)@     _________________________________________ Significant initial  Findings: Abnormal Labs Reviewed  SEDIMENTATION RATE - Abnormal; Notable for the following components:      Result Value   Sed Rate 37 (*)    All other components within normal limits  COMPREHENSIVE METABOLIC PANEL - Abnormal; Notable for the following components:   Sodium 134 (*)    Potassium 5.2 (*)    CO2 20 (*)    Glucose, Bld 244 (*)    All other components within normal limits  CBC WITH DIFFERENTIAL/PLATELET - Abnormal; Notable for the following components:   WBC 11.6 (*)    Hemoglobin 11.3 (*)    HCT 35.4 (*)    All other components within normal limits      ECG: Ordered Personally reviewed by me showing: HR : 84 Rhythm:Sinus rhythm Borderline right axis  deviation QTC 435    The recent clinical data is shown below. Vitals:   08/19/21 1500 08/19/21 1730 08/19/21 1740 08/19/21 1815  BP:  (!) 168/70  (!) 167/73  Pulse: 78 79 76 79  Resp: 18 14 13 15   Temp:      TempSrc:      SpO2: 99% 100% 99% 99%    WBC     Component Value Date/Time   WBC 11.6 (H) 08/19/2021 1442   LYMPHSABS 3.6 08/19/2021 1442   MONOABS 0.8 08/19/2021 1442   EOSABS 0.2 08/19/2021 1442   BASOSABS 0.1 08/19/2021 1442      Lactic Acid, Venous    Component Value Date/Time   LATICACIDVEN 1.2 08/19/2021 1442      Results for orders placed or performed during the hospital encounter of 06/23/14  Blood culture (routine x 2)     Status: None   Collection Time: 06/23/14 10:35 PM   Specimen: BLOOD  Result Value Ref Range Status   Specimen Description BLOOD RIGHT ANTECUBITAL  Final   Special Requests   Final    BOTTLES DRAWN AEROBIC AND ANAEROBIC AER 5cc ANA 5cc   Culture   Final    NO GROWTH 5 DAYS Performed at [facility]    Report Status 06/30/2014 FINAL  Final  Blood culture (routine x 2)     Status: None   Collection Time: 06/23/14 10:40 PM   Specimen: BLOOD  Result Value Ref Range Status   Specimen Description BLOOD RIGHT ARM  Final   Special Requests   Final    BOTTLES DRAWN AEROBIC AND ANAEROBIC AER 5cc ANA 5cc   Culture   Final    NO GROWTH 5 DAYS Performed at [facility]    Report Status 06/30/2014 FINAL  Final    ____________________________________ Hospitalist was called for admission for   Diabetic foot infection ([place])      The following Work up has been ordered so far:  Orders Placed This Encounter  Procedures   Blood Cultures x 2 sites   DG Foot Complete Left   Sedimentation rate   C-reactive protein   Comprehensive metabolic panel   CBC with Differential   Lactic acid   CK  Magnesium   Phosphorus   Prealbumin   Diet NPO time specified   Initiate Carrier Fluid Protocol   Patient may eat/drink    Cardiac monitoring   Consult to Peripheral Vascular Navigator   ceFEPIme (MAXIPIME) per pharmacy consult   Consult to podiatry Consult Timeframe: STAT - requires a response within one hour; STAT timeframe requires provider to provider communication, has the provider to provider communication been completed: Yes; Reason for Consult? diabetic foot   Consult to internal medicine   CBG monitoring, ED   EKG 12-Lead   Admit to Inpatient (patient's expected length of stay will be greater than 2 midnights or inpatient only procedure)     OTHER Significant initial  Findings:  labs showing:    Recent Labs  Lab 08/19/21 1442  NA 134*  K 5.2*  CO2 20*  GLUCOSE 244*  BUN 15  CREATININE 0.95  CALCIUM 9.0    Cr  stable,    Lab Results  Component Value Date   CREATININE 0.95 08/19/2021   CREATININE 0.97 07/30/2021   CREATININE 1.06 (H) 10/01/2020    Recent Labs  Lab 08/19/21 1442  AST 25  ALT 14  ALKPHOS 51  BILITOT 0.6  PROT 6.9  ALBUMIN 3.5   Lab Results  Component Value Date   CALCIUM 9.0 08/19/2021       Plt: Lab Results  Component Value Date   PLT 393 08/19/2021      COVID-19 Labs  Recent Labs    08/19/21 1442  CRP <0.5       Recent Labs  Lab 08/19/21 1442  WBC 11.6*  NEUTROABS 6.8  HGB 11.3*  HCT 35.4*  MCV 88.3  PLT 393    HG/HCT stable,       Component Value Date/Time   HGB 11.3 (L) 08/19/2021 1442   HCT 35.4 (L) 08/19/2021 1442   MCV 88.3 08/19/2021 1442     BNP (last 3 results) No results for input(s): BNP in the last 8760 hours.    DM  labs:  HbA1C: No results for input(s): HGBA1C in the last 8760 hours.     CBG (last 3)  Recent Labs    08/19/21 1941  GLUCAP 151*          Cultures:    Component Value Date/Time   SDES BLOOD RIGHT ARM 06/23/2014 2240   SPECREQUEST  06/23/2014 2240    BOTTLES DRAWN AEROBIC AND ANAEROBIC AER 5cc ANA 5cc   CULT  06/23/2014 2240    NO GROWTH 5 DAYS Performed at [facility] 06/30/2014 FINAL 06/23/2014 2240     Radiological Exams on Admission: DG Foot Complete Left  Result Date: 08/19/2021 CLINICAL DATA:  Left foot infection. History of diabetes and diabetic neuropathy. Patient reports infection to left foot from left pinky toe amputation 06/16/2021. Wound black with redness around it. Pain and chills. EXAM: LEFT FOOT - COMPLETE 3+ VIEW COMPARISON:  Left foot radiographs 07/30/2021 and 08/15/2014 FINDINGS: Postsurgical changes are again seen of amputation of the fifth toe phalanges. There is mildly increased lucency within the soft tissues at the amputation site. No cortical erosion is seen within the adjacent fifth metatarsal or fourth metatarsal/fourth toe proximal phalangeal bones. No significant osteoarthritis. Unchanged chronic minimally displaced fracture of the base of fifth metatarsal. IMPRESSION: Status post fifth toe phalangeal amputation unchanged. Mildly increased lucency within the soft tissues at the distal amputation site may represent the reported soft tissue infection. No cortical  erosion is seen to indicate radiographic evidence of acute osteomyelitis. Electronically Signed   By: Yvonne Kendall M.D.   On: 08/19/2021 14:27   _______________________________________________________________________________________________________ Latest  Blood pressure (!) 167/73, pulse 79, temperature 98.1 F (36.7 C), temperature source Oral, resp. rate 15, SpO2 99 %.   Vitals  labs and radiology finding personally reviewed  Review of Systems:    Pertinent positives include:  chills, fatigue,   Constitutional:  No weight loss, night sweats, Fevers,  weight loss  HEENT:  No headaches, Difficulty swallowing,Tooth/dental problems,Sore throat,  No sneezing, itching, ear ache, nasal congestion, post nasal drip,  Cardio-vascular:  No chest pain, Orthopnea, PND, anasarca, dizziness, palpitations.no Bilateral lower extremity swelling  GI:  No heartburn,  indigestion, abdominal pain, nausea, vomiting, diarrhea, change in bowel habits, loss of appetite, melena, blood in stool, hematemesis Resp:  no shortness of breath at rest. No dyspnea on exertion, No excess mucus, no productive cough, No non-productive cough, No coughing up of blood.No change in color of mucus.No wheezing. Skin:  no rash or lesions. No jaundice GU:  no dysuria, change in color of urine, no urgency or frequency. No straining to urinate.  No flank pain.  Musculoskeletal:  No joint pain or no joint swelling. No decreased range of motion. No back pain.  Psych:  No change in mood or affect. No depression or anxiety. No memory loss.  Neuro: no localizing neurological complaints, no tingling, no weakness, no double vision, no gait abnormality, no slurred speech, no confusion  All systems reviewed and apart from [place] all are negative _______________________________________________________________________________________________ Past Medical History:   Past Medical History:  Diagnosis Date   Arthritis    Asthma    Back pain    Bronchitis    Bursitis    Diabetes mellitus    Diabetic neuropathy ([place])    Diabetic retinopathy    Hyperlipemia    Hypertension    Migraine    Obesity    Sepsis due to urinary tract infection ([place])     Past Surgical History:  Procedure Laterality Date   CESAREAN SECTION     x3   CHOLECYSTECTOMY     EYE SURGERY Left     Social History:  Ambulatory   independently        reports that she has never smoked. She has never used smokeless tobacco. She reports that she does not drink alcohol and does not use drugs.     Family History: brother with CAD   ______________________________________________________________________________________________ Allergies: Allergies  Allergen Reactions   Bacitracin     Other reaction(s): Other Burns skin Burns skin    Citalopram Diarrhea   Doxycycline     Other reaction(s): Other "burning" all  over body "burning" all over body    Morphine And Related Hives   Neomycin     Other reaction(s): Other (See Comments), Unknown unknown    Ciprofloxacin     Tongue swells   Ciprofloxacin-Dexamethasone     Other reaction(s): Other (See Comments) -   Duloxetine     Other reaction(s): Mental Status Changes (intolerance) "saw pictures of gun"   Latex Itching   Levaquin [Levofloxacin In D5w] Other (See Comments)    Pt does not remember reaction but states she's had issues with med   Piper     Other reaction(s): Other (See Comments) White pepper- uncontrollable sneezing    Salvia Officinalis     Other reaction(s): Other (See Comments) Sneezing   Soma [Carisoprodol]  Sleepy and constipation   Lasix [Furosemide] Rash     Prior to Admission medications   Medication Sig Start Date End Date Taking? Authorizing Provider  ALPRAZolam Duanne Moron) 1 MG tablet Take 1 mg by mouth 3 (three) times daily as needed for anxiety.   Yes [provider]  FLUoxetine (PROZAC) 20 MG capsule Take 40 mg by mouth daily.   Yes [provider]  gabapentin (NEURONTIN) 300 MG capsule Take 300 mg by mouth 3 (three) times daily.   Yes [provider]  insulin lispro (HUMALOG) 100 UNIT/ML injection Inject 2-15 Units into the skin daily as needed for high blood sugar. Use per sliding scale when sugar is high but not over 400   Yes [provider]  lisinopril (PRINIVIL,ZESTRIL) 5 MG tablet Take 5 mg by mouth daily.   Yes [provider]  metFORMIN (GLUMETZA) 1000 MG (MOD) 24 hr tablet Take 1,000 mg by mouth 2 (two) times daily with a meal.   Yes [provider]  doxycycline (VIBRAMYCIN) 100 MG capsule Take 1 capsule (100 mg total) by mouth 2 (two) times daily. Patient not taking: Reported on 08/19/2021 07/30/21   Azucena Cecil, PA-C  hydrochlorothiazide (HYDRODIURIL) 25 MG tablet Take 1 tablet (25 mg total) by mouth daily. Patient not taking: Reported on  08/19/2021 06/29/14   Reyne Dumas, MD  HYDROcodone-acetaminophen (NORCO/VICODIN) 5-325 MG tablet Take 1 tablet by mouth every 6 (six) hours as needed. Patient not taking: Reported on 08/19/2021 10/01/20   Rayna Sexton, PA-C  insulin glargine (LANTUS) 100 UNIT/ML injection Inject 100 Units into the skin at bedtime.     [provider]  insulin regular (NOVOLIN R,HUMULIN R) 100 units/mL injection Inject 20-35 Units into the skin 3 (three) times daily as needed for high blood sugar. Per sliding scale    [provider]  ipratropium-albuterol (DUONEB) 0.5-2.5 (3) MG/3ML SOLN Take 3 mLs by nebulization every 6 (six) hours as needed.    [provider]  metFORMIN (GLUCOPHAGE) 850 MG tablet Take 850 mg by mouth 2 (two) times daily with a meal.    [provider]  naproxen (NAPROSYN) 500 MG tablet Take 500 mg by mouth 2 (two) times daily with a meal.    [provider]  pantoprazole (PROTONIX) 40 MG tablet Take 40 mg by mouth daily.    [provider]  Potassium Chloride Crys CR (K-DUR PO) Take 20 mEq by mouth daily.     [provider]  prochlorperazine (COMPAZINE) 10 MG tablet Take 1 tablet (10 mg total) by mouth 2 (two) times daily as needed for up to 10 doses for nausea or vomiting. 08/02/20   Trifan, Carola Rhine, MD  promethazine (PHENERGAN) 12.5 MG tablet Take 1 tablet (12.5 mg total) by mouth every 6 (six) hours as needed for nausea or vomiting. 06/25/14   Reyne Dumas, MD  topiramate (TOPAMAX) 25 MG tablet Take 25 mg by mouth daily.    [provider]    ___________________________________________________________________________________________________ Physical Exam:    08/19/2021    6:15 PM 08/19/2021    5:40 PM 08/19/2021    5:30 PM  Vitals with BMI  Systolic [id number]  [id number]  Diastolic 73  70  Pulse 79 76 79     1. General:  in No  Acute distress   well   -appearing 2. Psychological: Alert and  Oriented 3. Head/ENT:    Dry  Mucous Membranes  Head Non traumatic, neck supple                         Poor Dentition 4. SKIN:  decreased Skin turgor,  Skin clean Dry and intact redness swelling       5. Heart: Regular rate and rhythm no  Murmur, no Rub or gallop 6. Lungs:  Clear to auscultation bilaterally, no wheezes or crackles   7. Abdomen: Soft,  non-tender, Non distended   obese  bowel sounds present 8. Lower extremities: no clubbing, cyanosis, no  edema 9. Neurologically Grossly intact, moving all 4 extremities equally   10. MSK: Normal range of motion    Chart has been reviewed  ______________________________________________________________________________________________  Assessment/Plan  66 y.o. female with medical history significant of  DM 2, HTN, HLD peripheral neuropathy, CVA, COPD, CAD, history of DVT in 1980s, history of gastric bleed, GERD, obesity PAD OSA on CPAP  Admitted for   Diabetic foot infection (HCC)/ left foot cellulitis     Present on Admission:  Cellulitis  Diabetic neuropathy (HCC)  Accelerated hypertension  COPD mixed type (HCC)  Hyperkalemia     Diabetic neuropathy ([place]) chronic stable  Cellulitis -admit per  cellulitis protocol will        continue current antibiotic choice      plain films showed: no evidence of air  no evidence of osteomyelitis   no  foreign   objects      Will obtain MRSA screening,       obtain blood cultures  if febrile or septic     further antibiotic adjustment pending above results Podiatry is aware make NPO post midnight  Type 2 diabetes mellitus ([place])  - Order Sensitive SSI   - continue home insulin regimen decrease while NPO  -  check TSH and HgA1C  - Hold by mouth medications  diabetes coordinator consult in AM    Accelerated hypertension Given slightly elevated potassium will hold lisinopril. Contrived hydralazine as needed  COPD mixed type ([place]) Chronic stable continue home  medications  Hyperkalemia Mild hold lisinopril repeat labs after rehydration   OSA on CPAP cPAP ordered    Other plan as per orders.  DVT prophylaxis:  SCD         Code Status:    Code Status: Prior FULL CODE   as per patient     I had personally discussed CODE STATUS with patient     Family Communication:   Family not at  Bedside    Disposition Plan:        To home once workup is complete and patient is stable   Following barriers for discharge:                            Electrolytes corrected                                                             Pain controlled with PO medications                              white count improving able to transition to PO antibiotics  Will need to be able to tolerate PO                                                     Will need consultants to evaluate patient prior to discharge                        Would benefit from PT/OT eval prior to DC  Ordered                                     Diabetes care coordinator                                      Nutrition    consulted                  Wound care  consulted                      Consults called: Podiatry  Admission status:  ED Disposition     ED Disposition  [place]: [place] [[id number]]  Level of Care: Telemetry Medical [[id number]]  May admit patient to [facility] or [facility] if equivalent level of care is available:: No  Covid Evaluation: Asymptomatic - no recent exposure (last 10 days) testing not required  Diagnosis: Cellulitis [id number]  Admitting Physician: Toy Baker [[id number]]  Attending Physician: Toy Baker [[id number]]  Estimated length of stay: past midnight tomorrow  Certification:: I certify this patient will need inpatient services for at least 2 midnights           inpatient     I Expect 2 midnight stay secondary to severity of patient's current illness  need for inpatient interventions justified by the following:  Severe lab/radiological/exam abnormalities including:   Wound infection and extensive comorbidities including:  DM2   COPD/asthma   That are currently affecting medical management.   I expect  patient to be hospitalized for 2 midnights requiring inpatient medical care.  Patient is at high risk for adverse outcome (such as loss of life or disability) if not treated.  Indication for inpatient stay as follows:   inability to maintain oral hydration    Need for operative/procedural  intervention     Need for IV antibiotics, IV fluids,      Level of care     tele  For 12H    Vitor Overbaugh 08/19/2021, 9:18 PM    [facility]     after 2 AM please page floor coverage PA If 7AM-7PM, please contact the day team taking care of the patient using [email or web address]   Patient was evaluated in the context of the global COVID-19 pandemic, which necessitated consideration that the patient might be at risk for infection with the SARS-CoV-2 virus that causes COVID-19. Institutional protocols and algorithms that pertain to the evaluation of patients at risk for COVID-19 are in a state of rapid change based on information released by regulatory bodies including the CDC and federal and state organizations. These policies and algorithms were followed during the patient's care.

## 2021-08-19 NOTE — ED Notes (Signed)
Pt's BP is trending up to 190s. Admitting MD made aware.

## 2021-08-19 NOTE — Assessment & Plan Note (Signed)
Chronic stable continue home medications ?

## 2021-08-19 NOTE — Assessment & Plan Note (Signed)
Given slightly elevated potassium will hold lisinopril. Contrived hydralazine as needed

## 2021-08-19 NOTE — Progress Notes (Signed)
Pharmacy Antibiotic Note  Anna [name] is a 66 y.o. female admitted on 08/19/2021 presenting with foot infection.  Pharmacy has been consulted for vancomycin and cefepime dosing.  Plan: Vancomycin 1500mg  x 1 load, then 1000mg  q24h (eAUC 443, scr 0.95) Cefepime 2g IV q 8 hours Monitor renal function, Cx and clinical progression to narrow  Weight: 73.5 kg (162 lb 0.6 oz)  Temp (24hrs), Avg:98.3 F (36.8 C), Min:98.1 F (36.7 C), Max:98.4 F (36.9 C)  Recent Labs  Lab 08/19/21 1442  WBC 11.6*  CREATININE 0.95  LATICACIDVEN 1.2     Estimated Creatinine Clearance: 54.7 mL/min (by C-G formula based on SCr of 0.95 mg/dL).    Allergies  Allergen Reactions   Bacitracin     Other reaction(s): Other Burns skin     Citalopram Diarrhea   Doxycycline Other (See Comments)    Other reaction(s): Other "burning" all over body     Morphine And Related Hives   Neomycin     Other reaction(s): Other (See Comments), Unknown unknown    Ciprofloxacin     Tongue swells   Ciprofloxacin-Dexamethasone Other (See Comments)    unknown    Duloxetine     Other reaction(s): Mental Status Changes (intolerance) "saw pictures of gun"   Latex Itching   Levaquin [Levofloxacin In D5w] Other (See Comments)    Pt does not remember reaction but states she's had issues with med   Piper     Other reaction(s): Other (See Comments) White pepper- uncontrollable sneezing    Salvia Officinalis     Other reaction(s): Other (See Comments) Sneezing   Soma [Carisoprodol]     Sleepy and constipation   Lasix [Furosemide] Rash   Thank you for allowing pharmacy to participate in this patient's care.  , PharmD PGY1 Acute Care Resident  08/19/2021,9:45 PM

## 2021-08-19 NOTE — ED Notes (Signed)
Kirt Boys (daughter) of [place] called asking to speak to nurse, call back number is [phone].

## 2021-08-20 ENCOUNTER — Inpatient Hospital Stay (HOSPITAL_COMMUNITY): Payer: No Typology Code available for payment source

## 2021-08-20 DIAGNOSIS — I70223 Atherosclerosis of native arteries of extremities with rest pain, bilateral legs: Secondary | ICD-10-CM

## 2021-08-20 DIAGNOSIS — L0390 Cellulitis, unspecified: Secondary | ICD-10-CM

## 2021-08-20 DIAGNOSIS — L03116 Cellulitis of left lower limb: Secondary | ICD-10-CM

## 2021-08-20 DIAGNOSIS — I739 Peripheral vascular disease, unspecified: Secondary | ICD-10-CM

## 2021-08-20 DIAGNOSIS — I10 Essential (primary) hypertension: Secondary | ICD-10-CM | POA: Diagnosis not present

## 2021-08-20 DIAGNOSIS — E1142 Type 2 diabetes mellitus with diabetic polyneuropathy: Secondary | ICD-10-CM | POA: Diagnosis not present

## 2021-08-20 DIAGNOSIS — J449 Chronic obstructive pulmonary disease, unspecified: Secondary | ICD-10-CM | POA: Diagnosis not present

## 2021-08-20 DIAGNOSIS — I96 Gangrene, not elsewhere classified: Secondary | ICD-10-CM

## 2021-08-20 LAB — GLUCOSE, CAPILLARY
Glucose-Capillary: 118 mg/dL — ABNORMAL HIGH (ref 70–99)
Glucose-Capillary: 135 mg/dL — ABNORMAL HIGH (ref 70–99)
Glucose-Capillary: 139 mg/dL — ABNORMAL HIGH (ref 70–99)
Glucose-Capillary: 178 mg/dL — ABNORMAL HIGH (ref 70–99)
Glucose-Capillary: 181 mg/dL — ABNORMAL HIGH (ref 70–99)
Glucose-Capillary: 193 mg/dL — ABNORMAL HIGH (ref 70–99)
Glucose-Capillary: 199 mg/dL — ABNORMAL HIGH (ref 70–99)

## 2021-08-20 LAB — COMPREHENSIVE METABOLIC PANEL
ALT: 12 U/L (ref 0–44)
AST: 19 U/L (ref 15–41)
Albumin: 3.2 g/dL — ABNORMAL LOW (ref 3.5–5.0)
Alkaline Phosphatase: 45 U/L (ref 38–126)
Anion gap: 6 (ref 5–15)
BUN: 12 mg/dL (ref 8–23)
CO2: 25 mmol/L (ref 22–32)
Calcium: 8.8 mg/dL — ABNORMAL LOW (ref 8.9–10.3)
Chloride: 106 mmol/L (ref 98–111)
Creatinine, Ser: 0.87 mg/dL (ref 0.44–1.00)
GFR, Estimated: >60 mL/min (ref >60)
Glucose, Bld: 197 mg/dL — ABNORMAL HIGH (ref 70–99)
Potassium: 4.3 mmol/L (ref 3.5–5.1)
Sodium: 137 mmol/L (ref 135–145)
Total Bilirubin: 0.5 mg/dL (ref 0.3–1.2)
Total Protein: 6.5 g/dL (ref 6.5–8.1)

## 2021-08-20 LAB — CBC
HCT: 34 % — ABNORMAL LOW (ref 36.0–46.0)
Hemoglobin: 10.6 g/dL — ABNORMAL LOW (ref 12.0–15.0)
MCH: 27 pg (ref 26.0–34.0)
MCHC: 31.2 g/dL (ref 30.0–36.0)
MCV: 86.7 fL (ref 80.0–100.0)
Platelets: 369 10*3/uL (ref 150–400)
RBC: 3.92 MIL/uL (ref 3.87–5.11)
RDW: 13.5 % (ref 11.5–15.5)
WBC: 9.8 10*3/uL (ref 4.0–10.5)
nRBC: 0 % (ref 0.0–0.2)

## 2021-08-20 LAB — HEMOGLOBIN A1C
Hgb A1c MFr Bld: 8.2 % — ABNORMAL HIGH (ref 4.8–5.6)
Mean Plasma Glucose: 188.64 mg/dL

## 2021-08-20 LAB — RAPID URINE DRUG SCREEN, HOSP PERFORMED
Amphetamines: NOT DETECTED
Barbiturates: NOT DETECTED
Benzodiazepines: NOT DETECTED
Cocaine: NOT DETECTED
Opiates: NOT DETECTED
Tetrahydrocannabinol: NOT DETECTED

## 2021-08-20 LAB — HIV ANTIBODY (ROUTINE TESTING W REFLEX): HIV Screen 4th Generation wRfx: NONREACTIVE

## 2021-08-20 LAB — TSH: TSH: 1.279 u[IU]/mL (ref 0.350–4.500)

## 2021-08-20 LAB — PREALBUMIN: Prealbumin: 19 mg/dL (ref 18–38)

## 2021-08-20 LAB — MRSA NEXT GEN BY PCR, NASAL: MRSA by PCR Next Gen: NOT DETECTED

## 2021-08-20 LAB — PHOSPHORUS: Phosphorus: 3.5 mg/dL (ref 2.5–4.6)

## 2021-08-20 LAB — CK: Total CK: 37 U/L — ABNORMAL LOW (ref 38–234)

## 2021-08-20 LAB — MAGNESIUM: Magnesium: 1.8 mg/dL (ref 1.7–2.4)

## 2021-08-20 MED ORDER — FAMOTIDINE 20 MG PO TABS
20.0000 mg | ORAL_TABLET | Freq: Two times a day (BID) | ORAL | Status: DC
  Administered 2021-08-20 – 2021-08-27 (×14): 20 mg via ORAL
  Filled 2021-08-20 (×14): qty 1

## 2021-08-20 MED ORDER — POLYETHYLENE GLYCOL 3350 17 G PO PACK
17.0000 g | PACK | Freq: Every day | ORAL | Status: DC
  Administered 2021-08-26 – 2021-08-27 (×2): 17 g via ORAL
  Filled 2021-08-20 (×6): qty 1

## 2021-08-20 MED ORDER — GABAPENTIN 300 MG PO CAPS
300.0000 mg | ORAL_CAPSULE | Freq: Three times a day (TID) | ORAL | Status: DC
  Administered 2021-08-20 – 2021-08-27 (×18): 300 mg via ORAL
  Filled 2021-08-20 (×18): qty 1

## 2021-08-20 MED ORDER — DICLOFENAC SODIUM 1 % EX GEL
2.0000 g | Freq: Four times a day (QID) | CUTANEOUS | Status: DC
  Administered 2021-08-20 – 2021-08-27 (×22): 2 g via TOPICAL
  Filled 2021-08-20: qty 100

## 2021-08-20 NOTE — Plan of Care (Signed)
  Problem: Health Behavior/Discharge Planning: Goal: Ability to manage health-related needs will improve Outcome: Progressing   Problem: Clinical Measurements: Goal: Diagnostic test results will improve Outcome: Progressing   

## 2021-08-20 NOTE — Progress Notes (Signed)
PROGRESS NOTE    Anna [name]  XBD:[id number] DOB: 1955-10-21 DOA: 08/19/2021 PCP: Pcp, No   Brief Narrative: Anna [name] is a 66 y.o. female with a history of diabetes mellitus, hypertension, hyperlipidemia, peripheral neuropathy, CVA, COPD, DVT, gastric bleeding, GERD, obesity, PAD, OSA on CPAP. Patient presented secondary to a left foot infection in setting of chronic foot wounds.   Assessment and Plan:  Cellulitis Associated with left foot diabetic ulcer and recent right foot fifth digit amputation. Patient started empirically on Vancomycin, Cefepime and Flagyl on admission. Blood cultures obtained on admission. Podiatry consulted on admission. -Continue Vancomycin, Cefepime, Flagyl -Podiatry recommendations: MRI of left foot, likely debridement and/or amputation pending results -ABIs pending  Diabetes mellitus, type 2 Diabetic neuropathy Hemoglobin A1C of 8.2%. Patient is on metformin, Toujeo 20 units qHS, Trulicity, Jardiance, Humalog sliding scale as an outpatient. Started on Semglee 15 units and SSI on admission. -Continue gabapentin -Continue Semglee and SSI  Hypertensive urgency Primary hypertension Patient is on lisinopril, hydrochlorothiazide   COPD -DuoNeb PRN  Hyperkalemia Patient is on potassium supplementation as an outpatient. Mild with potassium of 5.2 on admission. Resolved. -Likely discontinue potassium on discharge  Hyperlipidemia -Continue Crestor  Anxiety -Continue Xanax PRN  GERD Patient is on famotidine and esomeprazole as an outpatient. She is also on Carafate PRN. -Resume home famotidine  Restless leg syndrome -Continue home Requip  OSA on CPAP -Continue CPAP  DVT prophylaxis: SCDs Code Status:   Code Status: Full Code Family Communication: None at bedside Disposition Plan: Discharge pending specialist recommendations   Consultants:  Podiatry  Procedures:  None  Antimicrobials: Vancomycin Cefepime Flagyl     Subjective: Patient reports some congestion. Some pain but feels like she is improved.  Objective: BP 138/72 (BP Location: Right Arm)   Pulse 76   Temp 98.2 F (36.8 C) (Oral)   Resp 18   Ht 5\' 2"  (1.575 m)   Wt 73.5 kg   SpO2 99%   BMI 29.64 kg/m   Examination:  General exam: Appears calm and comfortable Respiratory system: Clear to auscultation. Respiratory effort normal. Cardiovascular system: S1 & S2 heard, RRR. No murmurs, rubs, gallops or clicks. Gastrointestinal system: Abdomen is nondistended, soft and nontender. No organomegaly or masses felt. Normal bowel sounds heard. Central nervous system: Alert and oriented. No focal neurological deficits. Musculoskeletal: No edema. No calf tenderness Skin: No cyanosis. Left fifth digit amputation with eschar over wound. Left first digit with ulcer at lateral aspect Psychiatry: Judgement and insight appear normal. Mood & affect appropriate.    Data Reviewed: I have personally reviewed following labs and imaging studies  CBC Lab Results  Component Value Date   WBC 9.8 08/20/2021   RBC 3.92 08/20/2021   HGB 10.6 (L) 08/20/2021   HCT 34.0 (L) 08/20/2021   MCV 86.7 08/20/2021   MCH 27.0 08/20/2021   PLT 369 08/20/2021   MCHC 31.2 08/20/2021   RDW 13.5 08/20/2021   LYMPHSABS 3.6 08/19/2021   MONOABS 0.8 08/19/2021   EOSABS 0.2 08/19/2021   BASOSABS 0.1 08/19/2021     Last metabolic panel Lab Results  Component Value Date   NA 137 08/20/2021   K 4.3 08/20/2021   CL 106 08/20/2021   CO2 25 08/20/2021   BUN 12 08/20/2021   CREATININE 0.87 08/20/2021   GLUCOSE 197 (H) 08/20/2021   GFRNONAA >60 08/20/2021   GFRAA >90 06/25/2014   CALCIUM 8.8 (L) 08/20/2021   PHOS 3.5 08/20/2021   PROT 6.5 08/20/2021  ALBUMIN 3.2 (L) 08/20/2021   BILITOT 0.5 08/20/2021   ALKPHOS 45 08/20/2021   AST 19 08/20/2021   ALT 12 08/20/2021   ANIONGAP 6 08/20/2021    GFR: Estimated Creatinine Clearance: 59.7 mL/min (by C-G  formula based on SCr of 0.87 mg/dL).  Recent Results (from the past 240 hour(s))  MRSA Next Gen by PCR, Nasal     Status: None   Collection Time: 08/19/21 10:59 PM   Specimen: Urine, Clean Catch; Nasal Swab  Result Value Ref Range Status   MRSA by PCR Next Gen NOT DETECTED NOT DETECTED Final    Comment: (NOTE) The GeneXpert MRSA Assay (FDA approved for NASAL specimens only), is one component of a comprehensive MRSA colonization surveillance program. It is not intended to diagnose MRSA infection nor to guide or monitor treatment for MRSA infections. Test performance is not FDA approved in patients less than 80 years old. Performed at [facility], [address] [place], [place] [address]       Radiology Studies: DG Foot Complete Left  Result Date: 08/19/2021 CLINICAL DATA:  Left foot infection. History of diabetes and diabetic neuropathy. Patient reports infection to left foot from left pinky toe amputation 06/16/2021. Wound black with redness around it. Pain and chills. EXAM: LEFT FOOT - COMPLETE 3+ VIEW COMPARISON:  Left foot radiographs 07/30/2021 and 08/15/2014 FINDINGS: Postsurgical changes are again seen of amputation of the fifth toe phalanges. There is mildly increased lucency within the soft tissues at the amputation site. No cortical erosion is seen within the adjacent fifth metatarsal or fourth metatarsal/fourth toe proximal phalangeal bones. No significant osteoarthritis. Unchanged chronic minimally displaced fracture of the base of fifth metatarsal. IMPRESSION: Status post fifth toe phalangeal amputation unchanged. Mildly increased lucency within the soft tissues at the distal amputation site may represent the reported soft tissue infection. No cortical erosion is seen to indicate radiographic evidence of acute osteomyelitis. Electronically Signed   By: Neita Garnet M.D.   On: 08/19/2021 14:27      LOS: 1 day    Jacquelin Hawking, MD [facility] 08/20/2021, 12:53  PM   If 7PM-7AM, please contact night-coverage [email or web address]

## 2021-08-20 NOTE — Consult Note (Signed)
PODIATRY CONSULTATION  NAME ODYSSEY VASBINDER MRN [id number] DOB [birth year] DOA 08/19/2021   Reason for consult: Ulcer LT foot Chief Complaint  Patient presents with   L foot infection    Consulting physician: Lyndel Safe PA-C  History of present illness: 66 y.o. female PMHx DM type II, HTN, PAD admitted for worsening infection left foot. PSxHx left fifth toe amputation 06/16/2021 with [facility] in [facility], Dr. Hal Neer podiatry.  Podiatry consulted  Past Medical History:  Diagnosis Date   Arthritis    Asthma    Back pain    Bronchitis    Bursitis    Diabetes mellitus    Diabetic neuropathy (HCC)    Diabetic retinopathy    Hyperlipemia    Hypertension    Migraine    Obesity    Sepsis due to urinary tract infection (HCC)        Latest Ref Rng & Units 08/20/2021    1:19 AM 08/19/2021    2:42 PM 07/30/2021    8:22 PM  CBC  WBC 4.0 - 10.5 K/uL 9.8   11.6   9.8    Hemoglobin 12.0 - 15.0 g/dL 03.5   00.9   9.9    Hematocrit 36.0 - 46.0 % 34.0   35.4   31.3    Platelets 150 - 400 K/uL 369   393   420         Latest Ref Rng & Units 08/20/2021    1:19 AM 08/19/2021    2:42 PM 07/30/2021    8:22 PM  BMP  Glucose 70 - 99 mg/dL 381   829   937    BUN 8 - 23 mg/dL 12   15   15     Creatinine 0.44 - 1.00 mg/dL   1.69   6.78    Sodium 135 - 145 mmol/L 137   134   141    Potassium 3.5 - 5.1 mmol/L 4.3   5.2   4.5    Chloride 98 - 111 mmol/L 106   102   110    CO2 22 - 32 mmol/L 25   20   24     Calcium 8.9 - 10.3 mg/dL 8.8   9.0   8.8           Physical Exam: General: The patient is alert and oriented x3 in no acute distress.   Dermatology: S/P fifth digit amputation with well adhered eschar and surrounding erythema with edema.  No appreciable drainage.  No appreciable malodor.  Unstageable.  Additional well adhered eschar wound noted to the left great toe and an additional lesion more proximal to a lesser extent along the medial aspect of the foot.  No  drainage.  No erythema or edema noted around this area.  Remaining integument WNL  Vascular: Skin cool to touch.  Unable to palpate pedal pulses.  There is localized edema with erythema to the left lateral forefoot. VAS 9.38 ABI W/WO TBI pending  Neurological: Light touch and protective threshold diminished bilaterally.   Musculoskeletal Exam: PSxHx prior amputation LT fifth toe.   DG FOOT COMPLETE LEFT 08/19/2021 IMPRESSION: Status post fifth toe phalangeal amputation unchanged. Mildly increased lucency within the soft tissues at the distal amputation site may represent the reported soft tissue infection. No cortical erosion is seen to indicate radiographic evidence of acute osteomyelitis.   ASSESSMENT/PLAN OF CARE Gangrenous ulcer LT foot complicated by PAD  -Vascular ultrasound ABI has been ordered and  pending -MRI ordered LT foot wo contrast -Continue cefepime 2g Q8H as ordered  -Planning for surgical debridement and possible additional partial amputation pending ABI and MRI results.  Patient aware and understands -Will follow   Thank you for the consult.     Felecia Shelling, DPM [facility]  Dr. Felecia Shelling, DPM    [address] [place], [place] [address]                Office [phone]  Fax [phone]

## 2021-08-20 NOTE — Evaluation (Signed)
Physical Therapy Evaluation Patient Details Name: Anna [name] MRN: [id number] DOB: 1955/09/04 Today's Date: 08/20/2021  History of Present Illness  Anna [name] is a 66 y.o. female Coems to the hospital with worsening L foot swelling and redness post 5th ray amputation in March of this year; with medical history significant of  DM 2, HTN, HLD peripheral neuropathy, CVA, COPD, CAD, history of DVT in 1980s, history of gastric bleed, GERD, obesity PAD OSA on CPAP  Clinical Impression  Pt admitted with above diagnosis. Lives at home with family, in a single-level home with a few steps to enter; Prior to admission, pt was able to manage with some assist at mostly wheelchair transfer level; Presents to PT with functional dependencies, pain L foot limiting activity tolerance; Min assist to get up to EOB, Min assist to perform basic stand pivot transfer OOB to recliner on her R; Deferred walking as pt did not have her boot in the room; Will potentially have further amputation tomorrow, depending what testing shows today;  Pt currently with functional limitations due to the deficits listed below (see PT Problem List). Pt will benefit from skilled PT to increase their independence and safety with mobility to allow discharge to the venue listed below.          Recommendations for follow up therapy are one component of a multi-disciplinary discharge planning process, led by the attending physician.  Recommendations may be updated based on patient status, additional functional criteria and insurance authorization.  Follow Up Recommendations Home health PT; Consider also HHRN follow up -- her health literacy seems to be lacking    Assistance Recommended at Discharge Intermittent Supervision/Assistance  Patient can return home with the following  Help with stairs or ramp for entrance    Equipment Recommendations None recommended by PT (pretty well-equipped)  Recommendations for Other Services        Functional Status Assessment Patient has had a recent decline in their functional status and demonstrates the ability to make significant improvements in function in a reasonable and predictable amount of time.     Precautions / Restrictions Precautions Precautions: Fall Required Braces or Orthoses: Other Brace Other Brace: Pt describes needing to wear a postop boot, which she left at home      Mobility  Bed Mobility Overal bed mobility: Needs Assistance Bed Mobility: Supine to Sit     Supine to sit: Min assist     General bed mobility comments: Min handheld assist    Transfers Overall transfer level: Needs assistance Equipment used: 1 person hand held assist Transfers: Bed to chair/wheelchair/BSC   Stand pivot transfers: Min assist         General transfer comment: Min handheld assist to balance and support as pt pivoted  on R foot from bed to recliner on her R; touched L heel down during transfer    Ambulation/Gait               General Gait Details: Held ambulation today, no boot  Stairs            Wheelchair Mobility    Modified Rankin (Stroke Patients Only)       Balance                                             Pertinent Vitals/Pain Pain Assessment Pain Assessment: Faces Faces Pain  Scale: Hurts worst Pain Location: L foot after moving Pain Descriptors / Indicators: Aching, Cramping Pain Intervention(s): Monitored during session, Repositioned, Patient requesting pain meds-RN notified    Home Living Family/patient expects to be discharged to:: Private residence Living Arrangements: Other relatives Available Help at Discharge: Family Type of Home: House Home Access: Stairs to enter   [employer] of Steps: 1 (one step to enter in teh back; 3 in teh front)     Home Equipment: Rolling Walker (2 wheels);BSC/3in1;Tub bench;Wheelchair - manual;Hospital bed      Prior Function Prior Level of Function :  Needs assist             Mobility Comments: Mainly uses a wheelchair at this time for mobility; Pain limits her tolerance of bearing weight L foot ADLs Comments: wheelchair does not fit into bathroom; sometimes hops, or heel-walks on LLE to get from doorway to tub transfer bench; has a 3in1 in her bedrooom     Hand Dominance        Extremity/Trunk Assessment   Upper Extremity Assessment Upper Extremity Assessment: Defer to OT evaluation    Lower Extremity Assessment Lower Extremity Assessment: LLE deficits/detail LLE Deficits / Details: Hip, knee WFL; limited ankle AROM and does not tolerate weight bearing L foot due to pain; will touch down heel if she needs to stpep L foot down       Communication   Communication: No difficulties  Cognition Arousal/Alertness: Awake/alert Behavior During Therapy: WFL for tasks assessed/performed Overall Cognitive Status: Within Functional Limits for tasks assessed                                          General Comments General comments (skin integrity, edema, etc.): For MRI and other tests today with possible plans for surgery tomorrow    Exercises     Assessment/Plan    PT Assessment Patient needs continued PT services  PT Problem List Decreased range of motion;Decreased activity tolerance;Decreased balance;Decreased strength;Decreased mobility;Decreased safety awareness;Decreased knowledge of precautions;Pain       PT Treatment Interventions DME instruction;Gait training;Stair training;Functional mobility training;Therapeutic activities;Therapeutic exercise;Balance training;Patient/family education;Cognitive remediation;Wheelchair mobility training    PT Goals (Current goals can be found in the Care Plan section)  Acute Rehab PT Goals Patient Stated Goal: She wants to know what prcedure she might have tomorrow PT Goal Formulation: With patient Time For Goal Achievement: 09/03/21 Potential to Achieve Goals:  Good    Frequency Min 3X/week     Co-evaluation               AM-PAC PT "6 Clicks" Mobility  Outcome Measure Help needed turning from your back to your side while in a flat bed without using bedrails?: None Help needed moving from lying on your back to sitting on the side of a flat bed without using bedrails?: A Little Help needed moving to and from a bed to a chair (including a wheelchair)?: A Little Help needed standing up from a chair using your arms (e.g., wheelchair or bedside chair)?: A Little Help needed to walk in hospital room?: A Lot Help needed climbing 3-5 steps with a railing? : Total 6 Click Score: 16    End of Session   Activity Tolerance: Patient tolerated treatment well Patient left: in chair;with call bell/phone within reach;with chair alarm set Nurse Communication: Mobility status;Patient requests pain meds PT Visit Diagnosis:  Unsteadiness on feet (R26.81);Other abnormalities of gait and mobility (R26.89);Pain Pain - Right/Left: Left Pain - part of body: Ankle and joints of foot    Time: 1200-1232 PT Time Calculation (min) (ACUTE ONLY): 32 min   Charges:   PT Evaluation $PT Eval Moderate Complexity: 1 Mod PT Treatments $Therapeutic Activity: 8-22 mins        Roney Marion, PT  Acute Rehabilitation Services Office [phone]   Colletta [place] 08/20/2021, 2:13 PM

## 2021-08-20 NOTE — Progress Notes (Signed)
Placed patient on CPAP via FFM, auto settings (max 18, min 8) 21% FIO2.

## 2021-08-20 NOTE — Progress Notes (Signed)
VASCULAR LAB    ABI has been performed.  See CV proc for preliminary results.   Raekwon Winkowski, RVT 08/20/2021, 2:33 PM

## 2021-08-21 DIAGNOSIS — E1142 Type 2 diabetes mellitus with diabetic polyneuropathy: Secondary | ICD-10-CM | POA: Diagnosis not present

## 2021-08-21 DIAGNOSIS — I70245 Atherosclerosis of native arteries of left leg with ulceration of other part of foot: Secondary | ICD-10-CM

## 2021-08-21 DIAGNOSIS — I10 Essential (primary) hypertension: Secondary | ICD-10-CM | POA: Diagnosis not present

## 2021-08-21 DIAGNOSIS — J449 Chronic obstructive pulmonary disease, unspecified: Secondary | ICD-10-CM | POA: Diagnosis not present

## 2021-08-21 DIAGNOSIS — L03116 Cellulitis of left lower limb: Secondary | ICD-10-CM | POA: Diagnosis not present

## 2021-08-21 DIAGNOSIS — L0390 Cellulitis, unspecified: Secondary | ICD-10-CM

## 2021-08-21 LAB — GLUCOSE, CAPILLARY
Glucose-Capillary: 144 mg/dL — ABNORMAL HIGH (ref 70–99)
Glucose-Capillary: 114 mg/dL — ABNORMAL HIGH (ref 70–99)
Glucose-Capillary: 156 mg/dL — ABNORMAL HIGH (ref 70–99)
Glucose-Capillary: 171 mg/dL — ABNORMAL HIGH (ref 70–99)
Glucose-Capillary: 163 mg/dL — ABNORMAL HIGH (ref 70–99)

## 2021-08-21 MED ORDER — ADULT MULTIVITAMIN W/MINERALS CH
1.0000 | ORAL_TABLET | Freq: Every day | ORAL | Status: DC
  Administered 2021-08-21 – 2021-08-27 (×5): 1 via ORAL
  Filled 2021-08-21 (×5): qty 1

## 2021-08-21 MED ORDER — METHOCARBAMOL 500 MG PO TABS
500.0000 mg | ORAL_TABLET | Freq: Four times a day (QID) | ORAL | Status: DC | PRN
  Administered 2021-08-21 – 2021-08-26 (×8): 500 mg via ORAL
  Filled 2021-08-21 (×11): qty 1

## 2021-08-21 MED ORDER — ENSURE ENLIVE PO LIQD
237.0000 mL | Freq: Two times a day (BID) | ORAL | Status: DC
  Administered 2021-08-21 – 2021-08-27 (×7): 237 mL via ORAL

## 2021-08-21 MED ORDER — JUVEN PO PACK
1.0000 | PACK | Freq: Two times a day (BID) | ORAL | Status: DC
  Administered 2021-08-21 – 2021-08-27 (×7): 1 via ORAL
  Filled 2021-08-21 (×5): qty 1

## 2021-08-21 MED ORDER — INSULIN ASPART 100 UNIT/ML IJ SOLN
0.0000 [IU] | Freq: Three times a day (TID) | INTRAMUSCULAR | Status: DC
  Administered 2021-08-21 – 2021-08-22 (×4): 2 [IU] via SUBCUTANEOUS
  Administered 2021-08-23: 3 [IU] via SUBCUTANEOUS
  Administered 2021-08-23 – 2021-08-24 (×3): 2 [IU] via SUBCUTANEOUS
  Administered 2021-08-24: 1 [IU] via SUBCUTANEOUS
  Administered 2021-08-25: 3 [IU] via SUBCUTANEOUS
  Administered 2021-08-25: 2 [IU] via SUBCUTANEOUS
  Administered 2021-08-26 – 2021-08-27 (×2): 3 [IU] via SUBCUTANEOUS

## 2021-08-21 MED ORDER — ASPIRIN 81 MG PO TBEC
81.0000 mg | DELAYED_RELEASE_TABLET | Freq: Every day | ORAL | Status: DC
  Administered 2021-08-21 – 2021-08-27 (×6): 81 mg via ORAL
  Filled 2021-08-21 (×6): qty 1

## 2021-08-21 NOTE — Progress Notes (Signed)
Subjective: 66 year old female admitted to the hospital for cellulitis, wound of her left foot.  She previously has been treated by vascular surgery as well as podiatry at [facility].  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  She previously underwent balloon angioplasty of her anterior tibial artery [facility] in February of this year prior to surgery.  The wound is continued not to heal.  Below-knee amputation apparently been recommended and she wants to try to avoid this if possible.  She understands that this is a possibility.  Objective: AAO x3, NAD Necrotic changes present of the fifth metatarsal head with localized edema and erythema.  No fluctuation crepitation.  Eschar noted along the hallux and medial first metatarsal head as well.   No pain with calf compression, swelling, warmth, erythema  Assessment:  Ulceration left foot, PAD  Plan: Vascular plan for likely angio tomorrow.  Patient is concerned about groin infection.  She has nystatin ordered that she has been getting.  Continue IV antibiotics for now.  Await vascular surgery intervention before proceeding any further surgery in the foot. Awaiting MRI.  Podiatry will continue to follow  Ovid Curd, DPM

## 2021-08-21 NOTE — Evaluation (Signed)
Occupational Therapy Evaluation Patient Details Name: Anna [name] MRN: [id number] DOB: Sep 06, 1955 Today's Date: 08/21/2021   History of Present Illness Anna [name] is a 66 y.o. 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 Pt lives with granddaughter (20's) and great granddaughter who is almost 2, pt reports granddaughter works during the day. Pt currently needing min-max A for ADLs, min guard for bed mobility and stand pivot transfers. Pt presenting with impairments listed below, will follow acutely. Recommend HHOT at d/c.     Recommendations for follow up therapy are one component of a multi-disciplinary discharge planning process, led by the attending physician.  Recommendations may be updated based on patient status, additional functional criteria and insurance authorization.   Follow Up Recommendations  Home health OT    Assistance Recommended at Discharge Set up Supervision/Assistance  Patient can return home with the following A lot of help with walking and/or transfers;A lot of help with bathing/dressing/bathroom;Assistance with cooking/housework;Assist for transportation;Direct supervision/assist for medications management;Direct supervision/assist for financial management;Help with stairs or ramp for entrance    Functional Status Assessment  Patient has had a recent decline in their functional status and demonstrates the ability to make significant improvements in function in a reasonable and predictable amount of time.  Equipment Recommendations  None recommended by OT;Other (comment) (pt has all needed DME)     Recommendations for Other Services PT consult     Precautions / Restrictions Precautions Precautions: Fall Required Braces or Orthoses: Other Brace Other Brace: Pt describes needing to wear a postop boot, which she left at home Restrictions Weight Bearing Restrictions: No      Mobility Bed Mobility Overal bed mobility: Needs Assistance Bed Mobility: Sit to Supine       Sit to supine: Min guard        Transfers Overall transfer level: Needs assistance Equipment used: 1 person hand held assist Transfers: Bed to chair/wheelchair/BSC   Stand pivot transfers: Min guard         General transfer comment: pivoting on R foot, touching down with heel of L foot      Balance Overall balance assessment: Needs assistance Sitting-balance support: Feet supported, Bilateral upper extremity supported Sitting balance-Leahy Scale: Good     Standing balance support: During functional activity Standing balance-Leahy Scale: Poor Standing balance comment: reliant on external support due to inability to weight shift                           ADL either performed or assessed with clinical judgement   ADL Overall ADL's : Needs assistance/impaired Eating/Feeding: Set up;Sitting   Grooming: Set up;Sitting   Upper Body Bathing: Minimal assistance;Sitting   Lower Body Bathing: Moderate assistance;Sitting/lateral leans   Upper Body Dressing : Minimal assistance;Sitting Upper Body Dressing Details (indicate cue type and reason): to don gown Lower Body Dressing: Maximal assistance;Sitting/lateral leans Lower Body Dressing Details (indicate cue type and reason): to don sock Toilet Transfer: Min Garment/textile technologist Details (indicate cue type and reason): to [facility] and then to bed Toileting- Clothing Manipulation and Hygiene: Supervision/safety;Sitting/lateral lean  Toileting - Clothing Manipulation Details (indicate cue type and reason): for pericare      Functional mobility during ADLs: Min guard       Vision   Vision Assessment?: No apparent visual deficits     Perception     Praxis      Pertinent Vitals/Pain Pain Assessment Pain Assessment: Faces Pain Score: 8  Faces Pain Scale: Hurts whole lot Pain Location: L foot with donning sock Pain Descriptors / Indicators: Aching, Cramping Pain Intervention(s): Limited activity within patient's tolerance, Monitored during session, Repositioned     Hand Dominance     Extremity/Trunk Assessment Upper Extremity Assessment Upper Extremity Assessment: Overall WFL for tasks assessed   Lower Extremity Assessment Lower Extremity Assessment: Defer to PT evaluation   Cervical / Trunk Assessment Cervical / Trunk Assessment: Normal   Communication Communication Communication: No difficulties   Cognition Arousal/Alertness: Awake/alert Behavior During Therapy: WFL for tasks assessed/performed Overall Cognitive Status: Within Functional Limits for tasks assessed                                       General Comments  VSS on RA    Exercises     Shoulder Instructions      Home Living Family/patient expects to be discharged to:: Private residence Living Arrangements: Other relatives (granddaughter & great granddaughter) Available Help at Discharge: Family Type of Home: House Home Access: Stairs to enter Secretary/administrator of Steps: 1 Entrance Stairs-Rails: Left Home Layout: One level     Bathroom Shower/Tub: Engineer, production Accessibility: No How Accessible: Other (comment) Home Equipment: Rolling Walker (2 wheels);BSC/3in1;Tub bench;Wheelchair - manual;Hospital bed   Additional Comments: reports sleeping on an air mattress, will be getting furniture next week      Prior Functioning/Environment Prior Level of Function : Needs assist             Mobility Comments: Mainly uses a wheelchair at this time for mobility; Pain limits  her tolerance of bearing weight L foot ADLs Comments: wheelchair does not fit into bathroom; sometimes hops, or heel-walks on LLE to get from doorway to tub transfer bench; has a 3in1 in her bedrooom        OT Problem List: Decreased strength;Decreased range of motion;Impaired balance (sitting and/or standing);Decreased activity tolerance      OT Treatment/Interventions: Self-care/ADL training;Therapeutic exercise;DME and/or AE instruction;Therapeutic activities;Patient/family education;Balance training;Energy conservation    OT Goals(Current goals can be found in the care plan section) Acute Rehab OT Goals Patient Stated Goal: none stated OT Goal Formulation: With patient Time For Goal Achievement: 09/04/21 Potential to Achieve Goals: Good ADL Goals Pt Will Perform Upper Body Dressing: with modified independence;sitting Pt Will Perform Lower Body Dressing: with min guard assist;sit to/from stand;sitting/lateral leans Pt Will Transfer to Toilet: with modified independence;stand pivot transfer;squat pivot transfer;regular height toilet;bedside commode Pt Will Perform Tub/Shower Transfer: Shower transfer;Tub transfer;with supervision;tub bench  OT Frequency: Min 2X/week    Co-evaluation              AM-PAC OT "6 Clicks" Daily Activity     Outcome Measure Help from another person eating meals?: None Help from another person taking care of personal grooming?: None Help from another person toileting, which includes using toliet, bedpan, or urinal?: A Little Help from another person bathing (including washing, rinsing, drying)?: A Little Help from another person to put on and  taking off regular upper body clothing?: A Little Help from another person to put on and taking off regular lower body clothing?: A Lot 6 Click Score: 19   End of Session Nurse Communication: Mobility status  Activity Tolerance: Patient tolerated treatment well Patient left: in bed;with call bell/phone  within reach;with bed alarm set  OT Visit Diagnosis: Unsteadiness on feet (R26.81);Other abnormalities of gait and mobility (R26.89);History of falling (Z91.81);Muscle weakness (generalized) (M62.81)                Time: [id number] OT Time Calculation (min): 41 min Charges:  OT General Charges $OT Visit: 1 Visit OT Evaluation $OT Eval Moderate Complexity: 1 Mod OT Treatments $Self Care/Home Management : 23-37 mins  Alfonzo BeersNatalie Shahrukh Pasch, OTD, OTR/L Acute Rehab [phone]   Mayer Maskeratalie M Gladis Soley 08/21/2021, 4:04 PM

## 2021-08-21 NOTE — Plan of Care (Signed)
  Problem: Activity: Goal: Risk for activity intolerance will decrease Outcome: Progressing   

## 2021-08-21 NOTE — Progress Notes (Signed)
Initial Nutrition Assessment  DOCUMENTATION CODES:   Not applicable  INTERVENTION:  - Ensure Enlive po BID, each supplement provides 350 kcal and 20 grams of protein.  - 1 packet Juven BID, each packet provides 95 calories, 2.5 grams of protein (collagen), and 9.8 grams of carbohydrate (3 grams sugar); + additional vitamins and minerals to support wound healing  - MVI with minerals daily  - Reached out to social work to provide resources for meal assistance outpatient  NUTRITION DIAGNOSIS:   Increased nutrient needs related to wound healing as evidenced by estimated needs.  GOAL:   Patient will meet greater than or equal to 90% of their needs  MONITOR:   PO intake, Supplement acceptance, Vent status, Labs, Weight trends  REASON FOR ASSESSMENT:   Consult Assessment of nutrition requirement/status, Wound healing  ASSESSMENT:   Pt admitted with L foot infection, found to have cellulitis. PMH significant for T2DM, HTN, HLD, peripheral neuropathy, CVA, COPD, CAD, h/o DVT, gastric bleed, GERD, obesity, PAD and  OSA.  Per Podiatry, plans for surgical debridement and possible additional partial amputation pending ABI and MRI results.   Pt sitting in chair during time of visit. Provided therapeutic listening as pt described difficult living situation. She has limited access to meals as she is provided $20 a month for food assistance and also does not have access to reliable transportation. Her granddaughter has groceries delivered to their home. Pt states that she may open a can of beans and make fried bread and will eat this throughout the day. Pt is requesting local resources for access to food pantries that may deliver to her home. Reached out to social work for assistance.   Meal completions:  05/28: 100%-breakfast, 55%-lunch, 100%-dinner  Reviewed wt hx. Unfortunately, there is limited documentation of recent wt hx. Current wt noted to be 73.5 kg. Will continue to monitor  throughout admission.  Pt does not currently meet criteria for malnutrition however she is at high risk. Pt would benefit from addition of nutrition supplements to optimize nutritional intake to promote wound healing. She is agreeable to Ensure and Juven.   Medications: colace, pepcid, SSI 0-9 units Q4H, semglee 15 units daily, miralax, senna, IV abx  Labs: HgbA1c 8.2%, CBG's 114-193 x24 hours  NUTRITION - FOCUSED PHYSICAL EXAM:  Flowsheet Row Most Recent Value  Orbital Region No depletion  Upper Arm Region No depletion  Thoracic and Lumbar Region No depletion  Buccal Region No depletion  Temple Region No depletion  Clavicle Bone Region No depletion  Clavicle and Acromion Bone Region No depletion  Scapular Bone Region No depletion  Dorsal Hand Mild depletion  Patellar Region Moderate depletion  Anterior Thigh Region Moderate depletion  Posterior Calf Region Mild depletion  Edema (RD Assessment) None  Hair Reviewed  Eyes Reviewed  Mouth Reviewed  Skin Reviewed  Nails Reviewed      Diet Order:   Diet Order             Diet NPO time specified  Diet effective ____           Diet Carb Modified Fluid consistency: Thin; Room service appropriate? Yes  Diet effective now                   EDUCATION NEEDS:   Education needs have been addressed  Skin:  Skin Assessment: Skin Integrity Issues: Skin Integrity Issues:: Diabetic Ulcer Diabetic Ulcer: L anterior toe  Last BM:  5/28 (type 2)  Height:  Ht Readings from Last 1 Encounters:  08/19/21 5\' 2"  (1.575 m)    Weight:   Wt Readings from Last 1 Encounters:  08/19/21 73.5 kg   BMI:  Body mass index is 29.64 kg/m.  Estimated Nutritional Needs:   Kcal:  1700-1900  Protein:  90-105g  Fluid:  >/=1.7L  08/21/21, RDN, LDN Clinical Nutrition

## 2021-08-21 NOTE — H&P (View-Only) (Signed)
Hospital Consult    Reason for Consult:  left foot wound Referring Physician:  Dr. Caleb PoppNettey MRN #:  [id number]  History of Present Illness: 66 y.o. female previously underwent 2 mm balloon angioplasty of her anterior tibial artery at [facility] and February of this year.  Subsequently underwent left fifth toe amputation which has been slow to heal now with eschar.  She also has a wound on the great toe and just proximal to this.  She also has rest pain in the left foot which relieves with placing it in a dependent position.  She was hoping for a second opinion prior to proceeding with below-knee amputation and [facility] as was advised to her.  Past Medical History:  Diagnosis Date   Arthritis    Asthma    Back pain    Bronchitis    Bursitis    Diabetes mellitus    Diabetic neuropathy (HCC)    Diabetic retinopathy    Hyperlipemia    Hypertension    Migraine    Obesity    Sepsis due to urinary tract infection (HCC)     Past Surgical History:  Procedure Laterality Date   CESAREAN SECTION     x3   CHOLECYSTECTOMY     EYE SURGERY Left     Allergies  Allergen Reactions   Bacitracin     Other reaction(s): Other Burns skin     Citalopram Diarrhea   Doxycycline Other (See Comments)    Other reaction(s): Other "burning" all over body     Morphine And Related Hives   Neomycin     Other reaction(s): Other (See Comments), Unknown unknown    Ciprofloxacin     Tongue swells   Ciprofloxacin-Dexamethasone Other (See Comments)    unknown    Duloxetine     Other reaction(s): Mental Status Changes (intolerance) "saw pictures of gun"   Latex Itching   Levaquin [Levofloxacin In D5w] Other (See Comments)    Pt does not remember reaction but states she's had issues with med   Piper     Other reaction(s): Other (See Comments) White pepper- uncontrollable sneezing    Salvia Officinalis     Other reaction(s): Other (See Comments) Sneezing   Soma [Carisoprodol]     Sleepy  and constipation   Lasix [Furosemide] Rash    Prior to Admission medications   Medication Sig Start Date End Date Taking? Authorizing Provider  acetaminophen (TYLENOL) 500 MG tablet Take 1,000 mg by mouth every 6 (six) hours as needed for moderate pain.   Yes [provider]  albuterol (VENTOLIN HFA) 108 (90 Base) MCG/ACT inhaler Inhale 1-2 puffs into the lungs every 6 (six) hours as needed for wheezing or shortness of breath.   Yes [provider]  ALPRAZolam Prudy Feeler(XANAX) 1 MG tablet Take 1 mg by mouth 3 (three) times daily as needed for anxiety.   Yes [provider]  Baclofen 5 MG TABS Take 5 mg by mouth 3 (three) times daily as needed (muscle spasm).   Yes [provider]  busPIRone (BUSPAR) 10 MG tablet Take 25 mg by mouth 2 (two) times daily.   Yes [provider]  clopidogrel (PLAVIX) 75 MG tablet Take 75 mg by mouth daily.   Yes [provider]  diclofenac Sodium (VOLTAREN) 1 % GEL Apply 4 g topically 4 (four) times daily as needed (pain).   Yes [provider]  docusate sodium (COLACE) 100 MG capsule Take 100 mg by mouth 2 (two)  times daily as needed for mild constipation.   Yes [provider]  Dulaglutide (TRULICITY) 1.5 MG/0.5ML SOPN Inject 3 mg into the skin once a week. Fridays   Yes [provider]  empagliflozin (JARDIANCE) 10 MG TABS tablet Take 10 mg by mouth daily.   Yes [provider]  esomeprazole (NEXIUM) 40 MG capsule Take 40 mg by mouth 2 (two) times daily before a meal.   Yes [provider]  estradiol (ESTRACE) 0.1 MG/GM vaginal cream Place 1 Applicatorful vaginally See admin instructions. Three times a week   Yes [provider]  famotidine (PEPCID) 20 MG tablet Take 20 mg by mouth 2 (two) times daily.   Yes [provider]  FLUoxetine (PROZAC) 40 MG capsule Take 80 mg by mouth daily.   Yes [provider]  fluticasone (FLONASE) 50 MCG/ACT nasal  spray Place 1 spray into both nostrils daily as needed for allergies or rhinitis.   Yes [provider]  gabapentin (NEURONTIN) 300 MG capsule Take 300 mg by mouth 3 (three) times daily.   Yes [provider]  guaiFENesin (MUCINEX) 600 MG 12 hr tablet Take 600 mg by mouth daily as needed for cough.   Yes [provider]  hydrocortisone (ANUSOL-HC) 25 MG suppository Place 25 mg rectally 3 (three) times daily as needed for hemorrhoids or anal itching.   Yes [provider]  hydrOXYzine (ATARAX) 10 MG tablet Take 10 mg by mouth 2 (two) times daily.   Yes [provider]  ibuprofen (ADVIL) 200 MG tablet Take 600 mg by mouth every 8 (eight) hours as needed for moderate pain.   Yes [provider]  insulin glargine, 1 Unit Dial, (TOUJEO SOLOSTAR) 300 UNIT/ML Solostar Pen Inject 20 Units into the skin at bedtime.   Yes [provider]  insulin lispro (HUMALOG) 100 UNIT/ML KwikPen Inject 0-9 Units into the skin 3 (three) times daily. Sliding scale: less than 150 -0 units 150-200 - 2 units 201-250 - 3 units 251-300 - 4 units 301-350 - 5 units 351-400 - 6 units 401-450 - 7 units 451-500 - 8 units 501-550- 9 units Greater than 550 notify provider.   Yes [provider]  ipratropium-albuterol (DUONEB) 0.5-2.5 (3) MG/3ML SOLN Take 3 mLs by nebulization every 6 (six) hours as needed (wheezing).   Yes [provider]  Lactobacillus Rhamnosus, GG, (CULTURELLE HEALTH & WELLNESS) CAPS Take 1 capsule by mouth daily. 08/05/21  Yes [provider]  lidocaine 4 % Place 1 patch onto the skin daily as needed (pain).   Yes [provider]  lisinopril (ZESTRIL) 5 MG tablet Take 5 mg by mouth daily.   Yes [provider]  loperamide (IMODIUM) 2 MG capsule Take 2 mg by mouth daily as needed for diarrhea or loose stools.   Yes [provider]  loratadine (CLARITIN) 10 MG tablet Take 10 mg by mouth daily as  needed for allergies.   Yes [provider]  meclizine (ANTIVERT) 25 MG tablet Take 25 mg by mouth 4 (four) times daily as needed for dizziness.   Yes [provider]  metFORMIN (GLUCOPHAGE) 1000 MG tablet Take 1,000 mg by mouth 2 (two) times daily with a meal.   Yes [provider]  Multiple Vitamin (MULTIVITAMIN WITH MINERALS) TABS tablet Take 1 tablet by mouth daily.   Yes [provider]  nystatin cream (MYCOSTATIN) Apply 1 application. topically 4 (four) times daily as needed for dry skin.   Yes  [provider]  ondansetron (ZOFRAN-ODT) 4 MG disintegrating tablet Take 4 mg by mouth every 8 (eight) hours as needed for nausea or vomiting.   Yes [provider]  Phenylephrine-Acetaminophen (TYLENOL SINUS+HEADACHE) 5-325 MG TABS Take 2 tablets by mouth daily as needed (headache).   Yes [provider]  polyethylene glycol (MIRALAX / GLYCOLAX) 17 g packet Take 17 g by mouth daily as needed for moderate constipation.   Yes [provider]  potassium chloride SA (KLOR-CON M) 20 MEQ tablet Take 20 mEq by mouth daily.   Yes [provider]  rOPINIRole (REQUIP) 0.25 MG tablet Take 0.25 mg by mouth at bedtime.   Yes [provider]  rosuvastatin (CRESTOR) 20 MG tablet Take 20 mg by mouth daily.   Yes [provider]  senna (SENOKOT) 8.6 MG TABS tablet Take 2 tablets by mouth daily as needed for mild constipation.   Yes [provider]  sucralfate (CARAFATE) 1 GM/10ML suspension Take 1 g by mouth 4 (four) times daily as needed (stomch ulcer).   Yes [provider]  topiramate (TOPAMAX) 25 MG tablet Take 25 mg by mouth daily.   Yes [provider]  traZODone (DESYREL) 50 MG tablet Take 25-50 mg by mouth at bedtime as needed for sleep.   Yes [provider]  doxycycline (VIBRAMYCIN) 100 MG capsule Take 1 capsule (100 mg total) by mouth 2 (two) times daily. Patient not taking:  Reported on 08/19/2021 07/30/21   Al Decant, PA-C  hydrochlorothiazide (HYDRODIURIL) 25 MG tablet Take 1 tablet (25 mg total) by mouth daily. Patient not taking: Reported on 08/19/2021 06/29/14   Richarda Overlie, MD  HYDROcodone-acetaminophen (NORCO/VICODIN) 5-325 MG tablet Take 1 tablet by mouth every 6 (six) hours as needed. Patient not taking: Reported on 08/19/2021 10/01/20   Placido Sou, PA-C  prochlorperazine (COMPAZINE) 10 MG tablet Take 1 tablet (10 mg total) by mouth 2 (two) times daily as needed for up to 10 doses for nausea or vomiting. Patient not taking: Reported on 08/19/2021 08/02/20   Terald Sleeper, MD  promethazine (PHENERGAN) 12.5 MG tablet Take 1 tablet (12.5 mg total) by mouth every 6 (six) hours as needed for nausea or vomiting. Patient not taking: Reported on 08/19/2021 06/25/14   Richarda Overlie, MD    Social History   Socioeconomic History   Marital status: Divorced    Spouse name: Not on file   Number of children: Not on file   Years of education: Not on file   Highest education level: Not on file  Occupational History   Not on file  Tobacco Use   Smoking status: Never   Smokeless tobacco: Never  Substance and Sexual Activity   Alcohol use: No   Drug use: No   Sexual activity: Not on file  Other Topics Concern   Not on file  Social History Narrative   Not on file   Social Determinants of Health   Financial Resource Strain: Not on file  Food Insecurity: Not on file  Transportation Needs: Not on file  Physical Activity: Not on file  Stress: Not on file  Social Connections: Not on file  Intimate Partner Violence: Not on file     No family history on file.  Review of Systems  Constitutional: Negative.   HENT: Negative.    Eyes: Negative.   Respiratory: Negative.    Gastrointestinal: Negative.   Musculoskeletal: Negative.        Wounds as described above  Skin: Negative.   Neurological: Negative.   Endo/Heme/Allergies: Negative.    Psychiatric/Behavioral: Negative.       Physical Examination  Vitals:   08/21/21 0410 08/21/21 0942  BP: 123/60 (!) 127/59  Pulse: 73 74  Resp: 18 18  Temp: 97.8 F (36.6 C) 98.3 F (36.8 C)  SpO2: 99% 98%   Body mass index is 29.64 kg/m.  Physical Exam HENT:     Head: Normocephalic.     Nose: Nose normal.  Cardiovascular:     Rate and Rhythm: Normal rate.     Pulses:          Radial pulses are 2+ on the right side and 2+ on the left side.       Femoral pulses are 2+ on the right side and 2+ on the left side.      Popliteal pulses are 2+ on the right side and 2+ on the left side.       Dorsalis pedis pulses are 0 on the right side and 0 on the left side.       Posterior tibial pulses are 0 on the left side.  Pulmonary:     Effort: Pulmonary effort is normal.  Neurological:     Mental Status: She is alert.        CBC    Component Value Date/Time   WBC 9.8 08/20/2021 0119   RBC 3.92 08/20/2021 0119   HGB 10.6 (L) 08/20/2021 0119   HCT 34.0 (L) 08/20/2021 0119   PLT 369 08/20/2021 0119   MCV 86.7 08/20/2021 0119   MCH 27.0 08/20/2021 0119   MCHC 31.2 08/20/2021 0119   RDW 13.5 08/20/2021 0119   LYMPHSABS 3.6 08/19/2021 1442   MONOABS 0.8 08/19/2021 1442   EOSABS 0.2 08/19/2021 1442   BASOSABS 0.1 08/19/2021 1442    BMET    Component Value Date/Time   NA 137 08/20/2021 0119   K 4.3 08/20/2021 0119   CL 106 08/20/2021 0119   CO2 25 08/20/2021 0119   GLUCOSE 197 (H) 08/20/2021 0119   BUN 12 08/20/2021 0119   CREATININE 0.87 08/20/2021 0119   CALCIUM 8.8 (L) 08/20/2021 0119   GFRNONAA >60 08/20/2021 0119   GFRAA >90 06/25/2014 0535    COAGS: No results found for: INR, PROTIME   Non-Invasive Vascular Imaging:   ABI Findings:  +---------+------------------+-----+-----------+--------+  Right    Rt Pressure (mmHg)IndexWaveform   Comment   +---------+------------------+-----+-----------+--------+  Brachial 143                     multiphasic          +---------+------------------+-----+-----------+--------+  PTA      254               1.46 biphasic             +---------+------------------+-----+-----------+--------+  DP       254               1.46 monophasic           +---------+------------------+-----+-----------+--------+  Great Toe39                0.22                      +---------+------------------+-----+-----------+--------+   +---------+------------------+-----+-----------+-------+  Left     Lt Pressure (mmHg)IndexWaveform   Comment  +---------+------------------+-----+-----------+-------+  Brachial 174                      multiphasic         +---------+------------------+-----+-----------+-------+  PTA      254               1.46 monophasic          +---------+------------------+-----+-----------+-------+  DP       130               0.75 monophasic          +---------+------------------+-----+-----------+-------+  Great Toe0                 0.00                     +---------+------------------+-----+-----------+-------+   +-------+-------------+-----------+------------+------------+  ABI/TBIToday's ABI  Today's TBIPrevious ABIPrevious TBI  +-------+-------------+-----------+------------+------------+  Right  1.46 non comp0.22       non comp    0.49          +-------+-------------+-----------+------------+------------+  Left   1.46 non compabsent     non comp    absent        +-------+-------------+-----------+------------+------------+       ASSESSMENT/PLAN: This is a 66 y.o. female here with ulceration as pictured above of her left foot with previous anterior tibial artery balloon angioplasty at [facility] now over 3 months ago.  Plan will be for angiography from right common femoral approach to evaluate both lower extremities but to focus on the left tomorrow in the Cath Lab.  She can eat breakfast as her case is not  likely to be performed prior to 2 PM and she will be n.p.o. after.  I discussed the plan with her and she demonstrates good understanding.  Maximo Spratling C. Randie Heinz, MD [facility] Office: [phone] Pager: [phone]

## 2021-08-21 NOTE — Progress Notes (Signed)
   08/21/21 1235  Clinical Encounter Type  Visited With Patient  Visit Type Initial;Spiritual support  Referral From Nurse  Consult/Referral To Chaplain   Chaplin responded to a spiritual consult request for prayer. The patient, Anna [name], shared that she believed she needed to be prepared for the upcoming procedure. I listened as Anna [name] spoke of what she was expecting to take place and how important it was to her well being. We prayed for her peace of mind and the success of the procedure.   Valerie Roys [facility]  [phone]

## 2021-08-21 NOTE — Consult Note (Signed)
Hospital Consult    Reason for Consult:  left foot wound Referring Physician:  Dr. Caleb PoppNettey MRN #:  [id number]  History of Present Illness: 66 y.o. female previously underwent 2 mm balloon angioplasty of her anterior tibial artery at [facility] and February of this year.  Subsequently underwent left fifth toe amputation which has been slow to heal now with eschar.  She also has a wound on the great toe and just proximal to this.  She also has rest pain in the left foot which relieves with placing it in a dependent position.  She was hoping for a second opinion prior to proceeding with below-knee amputation and [facility] as was advised to her.  Past Medical History:  Diagnosis Date   Arthritis    Asthma    Back pain    Bronchitis    Bursitis    Diabetes mellitus    Diabetic neuropathy (HCC)    Diabetic retinopathy    Hyperlipemia    Hypertension    Migraine    Obesity    Sepsis due to urinary tract infection (HCC)     Past Surgical History:  Procedure Laterality Date   CESAREAN SECTION     x3   CHOLECYSTECTOMY     EYE SURGERY Left     Allergies  Allergen Reactions   Bacitracin     Other reaction(s): Other Burns skin     Citalopram Diarrhea   Doxycycline Other (See Comments)    Other reaction(s): Other "burning" all over body     Morphine And Related Hives   Neomycin     Other reaction(s): Other (See Comments), Unknown unknown    Ciprofloxacin     Tongue swells   Ciprofloxacin-Dexamethasone Other (See Comments)    unknown    Duloxetine     Other reaction(s): Mental Status Changes (intolerance) "saw pictures of gun"   Latex Itching   Levaquin [Levofloxacin In D5w] Other (See Comments)    Pt does not remember reaction but states she's had issues with med   Piper     Other reaction(s): Other (See Comments) White pepper- uncontrollable sneezing    Salvia Officinalis     Other reaction(s): Other (See Comments) Sneezing   Soma [Carisoprodol]     Sleepy  and constipation   Lasix [Furosemide] Rash    Prior to Admission medications   Medication Sig Start Date End Date Taking? Authorizing Provider  acetaminophen (TYLENOL) 500 MG tablet Take 1,000 mg by mouth every 6 (six) hours as needed for moderate pain.   Yes [provider]  albuterol (VENTOLIN HFA) 108 (90 Base) MCG/ACT inhaler Inhale 1-2 puffs into the lungs every 6 (six) hours as needed for wheezing or shortness of breath.   Yes [provider]  ALPRAZolam Prudy Feeler(XANAX) 1 MG tablet Take 1 mg by mouth 3 (three) times daily as needed for anxiety.   Yes [provider]  Baclofen 5 MG TABS Take 5 mg by mouth 3 (three) times daily as needed (muscle spasm).   Yes [provider]  busPIRone (BUSPAR) 10 MG tablet Take 25 mg by mouth 2 (two) times daily.   Yes [provider]  clopidogrel (PLAVIX) 75 MG tablet Take 75 mg by mouth daily.   Yes [provider]  diclofenac Sodium (VOLTAREN) 1 % GEL Apply 4 g topically 4 (four) times daily as needed (pain).   Yes [provider]  docusate sodium (COLACE) 100 MG capsule Take 100 mg by mouth 2 (two)  times daily as needed for mild constipation.   Yes [provider]  Dulaglutide (TRULICITY) 1.5 MG/0.5ML SOPN Inject 3 mg into the skin once a week. Fridays   Yes [provider]  empagliflozin (JARDIANCE) 10 MG TABS tablet Take 10 mg by mouth daily.   Yes [provider]  esomeprazole (NEXIUM) 40 MG capsule Take 40 mg by mouth 2 (two) times daily before a meal.   Yes [provider]  estradiol (ESTRACE) 0.1 MG/GM vaginal cream Place 1 Applicatorful vaginally See admin instructions. Three times a week   Yes [provider]  famotidine (PEPCID) 20 MG tablet Take 20 mg by mouth 2 (two) times daily.   Yes [provider]  FLUoxetine (PROZAC) 40 MG capsule Take 80 mg by mouth daily.   Yes [provider]  fluticasone (FLONASE) 50 MCG/ACT nasal  spray Place 1 spray into both nostrils daily as needed for allergies or rhinitis.   Yes [provider]  gabapentin (NEURONTIN) 300 MG capsule Take 300 mg by mouth 3 (three) times daily.   Yes [provider]  guaiFENesin (MUCINEX) 600 MG 12 hr tablet Take 600 mg by mouth daily as needed for cough.   Yes [provider]  hydrocortisone (ANUSOL-HC) 25 MG suppository Place 25 mg rectally 3 (three) times daily as needed for hemorrhoids or anal itching.   Yes [provider]  hydrOXYzine (ATARAX) 10 MG tablet Take 10 mg by mouth 2 (two) times daily.   Yes [provider]  ibuprofen (ADVIL) 200 MG tablet Take 600 mg by mouth every 8 (eight) hours as needed for moderate pain.   Yes [provider]  insulin glargine, 1 Unit Dial, (TOUJEO SOLOSTAR) 300 UNIT/ML Solostar Pen Inject 20 Units into the skin at bedtime.   Yes [provider]  insulin lispro (HUMALOG) 100 UNIT/ML KwikPen Inject 0-9 Units into the skin 3 (three) times daily. Sliding scale: less than 150 -0 units 150-200 - 2 units 201-250 - 3 units 251-300 - 4 units 301-350 - 5 units 351-400 - 6 units 401-450 - 7 units 451-500 - 8 units 501-550- 9 units Greater than 550 notify provider.   Yes [provider]  ipratropium-albuterol (DUONEB) 0.5-2.5 (3) MG/3ML SOLN Take 3 mLs by nebulization every 6 (six) hours as needed (wheezing).   Yes [provider]  Lactobacillus Rhamnosus, GG, (CULTURELLE HEALTH & WELLNESS) CAPS Take 1 capsule by mouth daily. 08/05/21  Yes [provider]  lidocaine 4 % Place 1 patch onto the skin daily as needed (pain).   Yes [provider]  lisinopril (ZESTRIL) 5 MG tablet Take 5 mg by mouth daily.   Yes [provider]  loperamide (IMODIUM) 2 MG capsule Take 2 mg by mouth daily as needed for diarrhea or loose stools.   Yes [provider]  loratadine (CLARITIN) 10 MG tablet Take 10 mg by mouth daily as  needed for allergies.   Yes [provider]  meclizine (ANTIVERT) 25 MG tablet Take 25 mg by mouth 4 (four) times daily as needed for dizziness.   Yes [provider]  metFORMIN (GLUCOPHAGE) 1000 MG tablet Take 1,000 mg by mouth 2 (two) times daily with a meal.   Yes [provider]  Multiple Vitamin (MULTIVITAMIN WITH MINERALS) TABS tablet Take 1 tablet by mouth daily.   Yes [provider]  nystatin cream (MYCOSTATIN) Apply 1 application. topically 4 (four) times daily as needed for dry skin.   Yes  [provider]  ondansetron (ZOFRAN-ODT) 4 MG disintegrating tablet Take 4 mg by mouth every 8 (eight) hours as needed for nausea or vomiting.   Yes [provider]  Phenylephrine-Acetaminophen (TYLENOL SINUS+HEADACHE) 5-325 MG TABS Take 2 tablets by mouth daily as needed (headache).   Yes [provider]  polyethylene glycol (MIRALAX / GLYCOLAX) 17 g packet Take 17 g by mouth daily as needed for moderate constipation.   Yes [provider]  potassium chloride SA (KLOR-CON M) 20 MEQ tablet Take 20 mEq by mouth daily.   Yes [provider]  rOPINIRole (REQUIP) 0.25 MG tablet Take 0.25 mg by mouth at bedtime.   Yes [provider]  rosuvastatin (CRESTOR) 20 MG tablet Take 20 mg by mouth daily.   Yes [provider]  senna (SENOKOT) 8.6 MG TABS tablet Take 2 tablets by mouth daily as needed for mild constipation.   Yes [provider]  sucralfate (CARAFATE) 1 GM/10ML suspension Take 1 g by mouth 4 (four) times daily as needed (stomch ulcer).   Yes [provider]  topiramate (TOPAMAX) 25 MG tablet Take 25 mg by mouth daily.   Yes [provider]  traZODone (DESYREL) 50 MG tablet Take 25-50 mg by mouth at bedtime as needed for sleep.   Yes [provider]  doxycycline (VIBRAMYCIN) 100 MG capsule Take 1 capsule (100 mg total) by mouth 2 (two) times daily. Patient not taking:  Reported on 08/19/2021 07/30/21   Al Decant, PA-C  hydrochlorothiazide (HYDRODIURIL) 25 MG tablet Take 1 tablet (25 mg total) by mouth daily. Patient not taking: Reported on 08/19/2021 06/29/14   Richarda Overlie, MD  HYDROcodone-acetaminophen (NORCO/VICODIN) 5-325 MG tablet Take 1 tablet by mouth every 6 (six) hours as needed. Patient not taking: Reported on 08/19/2021 10/01/20   Placido Sou, PA-C  prochlorperazine (COMPAZINE) 10 MG tablet Take 1 tablet (10 mg total) by mouth 2 (two) times daily as needed for up to 10 doses for nausea or vomiting. Patient not taking: Reported on 08/19/2021 08/02/20   Terald Sleeper, MD  promethazine (PHENERGAN) 12.5 MG tablet Take 1 tablet (12.5 mg total) by mouth every 6 (six) hours as needed for nausea or vomiting. Patient not taking: Reported on 08/19/2021 06/25/14   Richarda Overlie, MD    Social History   Socioeconomic History   Marital status: Divorced    Spouse name: Not on file   Number of children: Not on file   Years of education: Not on file   Highest education level: Not on file  Occupational History   Not on file  Tobacco Use   Smoking status: Never   Smokeless tobacco: Never  Substance and Sexual Activity   Alcohol use: No   Drug use: No   Sexual activity: Not on file  Other Topics Concern   Not on file  Social History Narrative   Not on file   Social Determinants of Health   Financial Resource Strain: Not on file  Food Insecurity: Not on file  Transportation Needs: Not on file  Physical Activity: Not on file  Stress: Not on file  Social Connections: Not on file  Intimate Partner Violence: Not on file     No family history on file.  Review of Systems  Constitutional: Negative.   HENT: Negative.    Eyes: Negative.   Respiratory: Negative.    Gastrointestinal: Negative.   Musculoskeletal: Negative.        Wounds as described above  Skin: Negative.   Neurological: Negative.   Endo/Heme/Allergies: Negative.    Psychiatric/Behavioral: Negative.       Physical Examination  Vitals:   08/21/21 0410 08/21/21 0942  BP: 123/60 (!) 127/59  Pulse: 73 74  Resp: 18 18  Temp: 97.8 F (36.6 C) 98.3 F (36.8 C)  SpO2: 99% 98%   Body mass index is 29.64 kg/m.  Physical Exam HENT:     Head: Normocephalic.     Nose: Nose normal.  Cardiovascular:     Rate and Rhythm: Normal rate.     Pulses:          Radial pulses are 2+ on the right side and 2+ on the left side.       Femoral pulses are 2+ on the right side and 2+ on the left side.      Popliteal pulses are 2+ on the right side and 2+ on the left side.       Dorsalis pedis pulses are 0 on the right side and 0 on the left side.       Posterior tibial pulses are 0 on the left side.  Pulmonary:     Effort: Pulmonary effort is normal.  Neurological:     Mental Status: She is alert.        CBC    Component Value Date/Time   WBC 9.8 08/20/2021 0119   RBC 3.92 08/20/2021 0119   HGB 10.6 (L) 08/20/2021 0119   HCT 34.0 (L) 08/20/2021 0119   PLT 369 08/20/2021 0119   MCV 86.7 08/20/2021 0119   MCH 27.0 08/20/2021 0119   MCHC 31.2 08/20/2021 0119   RDW 13.5 08/20/2021 0119   LYMPHSABS 3.6 08/19/2021 1442   MONOABS 0.8 08/19/2021 1442   EOSABS 0.2 08/19/2021 1442   BASOSABS 0.1 08/19/2021 1442    BMET    Component Value Date/Time   NA 137 08/20/2021 0119   K 4.3 08/20/2021 0119   CL 106 08/20/2021 0119   CO2 25 08/20/2021 0119   GLUCOSE 197 (H) 08/20/2021 0119   BUN 12 08/20/2021 0119   CREATININE 0.87 08/20/2021 0119   CALCIUM 8.8 (L) 08/20/2021 0119   GFRNONAA >60 08/20/2021 0119   GFRAA >90 06/25/2014 0535    COAGS: No results found for: INR, PROTIME   Non-Invasive Vascular Imaging:   ABI Findings:  +---------+------------------+-----+-----------+--------+  Right    Rt Pressure (mmHg)IndexWaveform   Comment   +---------+------------------+-----+-----------+--------+  Brachial 143                     multiphasic          +---------+------------------+-----+-----------+--------+  PTA      254               1.46 biphasic             +---------+------------------+-----+-----------+--------+  DP       254               1.46 monophasic           +---------+------------------+-----+-----------+--------+  Great Toe39                0.22                      +---------+------------------+-----+-----------+--------+   +---------+------------------+-----+-----------+-------+  Left     Lt Pressure (mmHg)IndexWaveform   Comment  +---------+------------------+-----+-----------+-------+  Brachial 174  multiphasic         +---------+------------------+-----+-----------+-------+  PTA      254               1.46 monophasic          +---------+------------------+-----+-----------+-------+  DP       130               0.75 monophasic          +---------+------------------+-----+-----------+-------+  Great Toe0                 0.00                     +---------+------------------+-----+-----------+-------+   +-------+-------------+-----------+------------+------------+  ABI/TBIToday's ABI  Today's TBIPrevious ABIPrevious TBI  +-------+-------------+-----------+------------+------------+  Right  1.46 non comp0.22       non comp    0.49          +-------+-------------+-----------+------------+------------+  Left   1.46 non compabsent     non comp    absent        +-------+-------------+-----------+------------+------------+       ASSESSMENT/PLAN: This is a 66 y.o. female here with ulceration as pictured above of her left foot with previous anterior tibial artery balloon angioplasty at [facility] now over 3 months ago.  Plan will be for angiography from right common femoral approach to evaluate both lower extremities but to focus on the left tomorrow in the Cath Lab.  She can eat breakfast as her case is not  likely to be performed prior to 2 PM and she will be n.p.o. after.  I discussed the plan with her and she demonstrates good understanding.  Jailon Schaible C. Randie Heinz, MD [facility] Office: [phone] Pager: [phone]

## 2021-08-21 NOTE — Progress Notes (Signed)
PROGRESS NOTE    Anna [name]  YQM:[id number] DOB: 1955-08-09 DOA: 08/19/2021 PCP: Pcp, No   Brief Narrative: Anna [name] is a 66 y.o. female with a history of diabetes mellitus, hypertension, hyperlipidemia, peripheral neuropathy, CVA, COPD, DVT, gastric bleeding, GERD, obesity, PAD, OSA on CPAP. Patient presented secondary to a left foot infection in setting of chronic foot wounds.   Assessment and Plan:  Cellulitis Associated with left foot diabetic ulcer and recent right foot fifth digit amputation. Patient started empirically on Vancomycin, Cefepime and Flagyl on admission. Blood cultures obtained on admission. Podiatry consulted on admission. ABIs with non-compressible vessels. -Continue Vancomycin, Cefepime, Flagyl -Podiatry recommendations: MRI of left foot, likely debridement and/or amputation pending results -Vascular surgery consult; likely angiogram  Diabetes mellitus, type 2 Diabetic neuropathy Hemoglobin A1C of 8.2%. Patient is on metformin, Toujeo 20 units qHS, Trulicity, Jardiance, Humalog sliding scale as an outpatient. Started on Semglee 15 units and SSI on admission. -Continue gabapentin -Continue Semglee and SSI  Hypertensive urgency Primary hypertension Patient is on lisinopril, hydrochlorothiazide   COPD -DuoNeb PRN  Hyperkalemia Patient is on potassium supplementation as an outpatient. Mild with potassium of 5.2 on admission. Resolved. -Likely discontinue potassium on discharge  Chronic anemia Stable. -Obtain anemia panel in AM  Hyperlipidemia -Continue Crestor  Anxiety -Continue Xanax PRN  GERD Patient is on famotidine and esomeprazole as an outpatient. She is also on Carafate PRN. -Resume home famotidine  Restless leg syndrome -Continue home Requip  OSA on CPAP -Continue CPAP  DVT prophylaxis: SCDs Code Status:   Code Status: Full Code Family Communication: None at bedside Disposition Plan: Discharge pending specialist  recommendations in addition to outpatient antibiotics as needed likely in several days   Consultants:  Podiatry Vascular surgery  Procedures:  ABIs (5/28)  Antimicrobials: Vancomycin Cefepime Flagyl    Subjective: Patient reports some congestion. Some pain but feels like she is improved.  Objective: BP (!) 127/59 (BP Location: Right Arm)   Pulse 74   Temp 98.3 F (36.8 C) (Oral)   Resp 18   Ht 5\' 2"  (1.575 m)   Wt 73.5 kg   SpO2 98%   BMI 29.64 kg/m   Examination:  General exam: Appears calm and comfortable Respiratory system: Clear to auscultation. Respiratory effort normal. Cardiovascular system: S1 & S2 heard, RRR. Gastrointestinal system: Abdomen is nondistended, soft and nontender. Normal bowel sounds heard. Central nervous system: Alert and oriented. No focal neurological deficits. Psychiatry: Judgement and insight appear normal. Mood & affect appropriate.    Data Reviewed: I have personally reviewed following labs and imaging studies  CBC Lab Results  Component Value Date   WBC 9.8 08/20/2021   RBC 3.92 08/20/2021   HGB 10.6 (L) 08/20/2021   HCT 34.0 (L) 08/20/2021   MCV 86.7 08/20/2021   MCH 27.0 08/20/2021   PLT 369 08/20/2021   MCHC 31.2 08/20/2021   RDW 13.5 08/20/2021   LYMPHSABS 3.6 08/19/2021   MONOABS 0.8 08/19/2021   EOSABS 0.2 08/19/2021   BASOSABS 0.1 08/19/2021     Last metabolic panel Lab Results  Component Value Date   NA 137 08/20/2021   K 4.3 08/20/2021   CL 106 08/20/2021   CO2 25 08/20/2021   BUN 12 08/20/2021   CREATININE 0.87 08/20/2021   GLUCOSE 197 (H) 08/20/2021   GFRNONAA >60 08/20/2021   GFRAA >90 06/25/2014   CALCIUM 8.8 (L) 08/20/2021   PHOS 3.5 08/20/2021   PROT 6.5 08/20/2021   ALBUMIN 3.2 (L)  08/20/2021   BILITOT 0.5 08/20/2021   ALKPHOS 45 08/20/2021   AST 19 08/20/2021   ALT 12 08/20/2021   ANIONGAP 6 08/20/2021    GFR: Estimated Creatinine Clearance: 59.7 mL/min (by C-G formula based on SCr  of 0.87 mg/dL).  Recent Results (from the past 240 hour(s))  Blood Cultures x 2 sites     Status: None (Preliminary result)   Collection Time: 08/19/21  1:49 PM   Specimen: BLOOD RIGHT HAND  Result Value Ref Range Status   Specimen Description BLOOD RIGHT HAND  Final   Special Requests   Final    BOTTLES DRAWN AEROBIC AND ANAEROBIC Blood Culture results may not be optimal due to an inadequate volume of blood received in culture bottles   Culture   Final    NO GROWTH 2 DAYS Performed at [facility], [address] [place], [place] [address]    Report Status PENDING  Incomplete  Blood Cultures x 2 sites     Status: None (Preliminary result)   Collection Time: 08/19/21  1:54 PM   Specimen: BLOOD LEFT HAND  Result Value Ref Range Status   Specimen Description BLOOD LEFT HAND  Final   Special Requests   Final    BOTTLES DRAWN AEROBIC AND ANAEROBIC Blood Culture results may not be optimal due to an inadequate volume of blood received in culture bottles   Culture   Final    NO GROWTH 2 DAYS Performed at [facility], [address] [place], [place] [address]    Report Status PENDING  Incomplete  MRSA Next Gen by PCR, Nasal     Status: None   Collection Time: 08/19/21 10:59 PM   Specimen: Urine, Clean Catch; Nasal Swab  Result Value Ref Range Status   MRSA by PCR Next Gen NOT DETECTED NOT DETECTED Final    Comment: (NOTE) The GeneXpert MRSA Assay (FDA approved for NASAL specimens only), is one component of a comprehensive MRSA colonization surveillance program. It is not intended to diagnose MRSA infection nor to guide or monitor treatment for MRSA infections. Test performance is not FDA approved in patients less than 67 years old. Performed at [facility], [address] [place], [place] [address]       Radiology Studies: DG Foot Complete Left  Result Date: 08/19/2021 CLINICAL DATA:  Left foot infection. History of diabetes and diabetic neuropathy.  Patient reports infection to left foot from left pinky toe amputation 06/16/2021. Wound black with redness around it. Pain and chills. EXAM: LEFT FOOT - COMPLETE 3+ VIEW COMPARISON:  Left foot radiographs 07/30/2021 and 08/15/2014 FINDINGS: Postsurgical changes are again seen of amputation of the fifth toe phalanges. There is mildly increased lucency within the soft tissues at the amputation site. No cortical erosion is seen within the adjacent fifth metatarsal or fourth metatarsal/fourth toe proximal phalangeal bones. No significant osteoarthritis. Unchanged chronic minimally displaced fracture of the base of fifth metatarsal. IMPRESSION: Status post fifth toe phalangeal amputation unchanged. Mildly increased lucency within the soft tissues at the distal amputation site may represent the reported soft tissue infection. No cortical erosion is seen to indicate radiographic evidence of acute osteomyelitis. Electronically Signed   By: Neita Garnetonald  Viola M.D.   On: 08/19/2021 14:27   VAS US ABI WITH/WO TBI  Result Date: 08/20/2021  LOWER EXTREMITY DOPPLER STUDY Patient Name:  Anna [name]  Date of Exam:   08/20/2021 Medical Rec #: [id number]  Accession #:    [id number] Date of Birth: 1956-03-02        Patient Gender: F Patient Age:   33 years Exam Location:  [facility] Procedure:      VAS [place] ABI WITH/WO TBI Referring Phys: ANASTASSIA DOUTOVA --------------------------------------------------------------------------------  Indications: Rest pain, ulceration, gangrene, and peripheral artery disease. High Risk Factors: Hypertension, hyperlipidemia, Diabetes.  Vascular Interventions: Balloon angioplasty of left anterior tibial artery                         05/12/21 at [facility], 5th toe amputation 06/16/21 at                         [facility],. Limitations: Today's exam was limited due to involuntary patient movement              secondary to ischemia. Comparison Study: No prior study on file at [facility]. There is mention of                   prior PVR done at [facility] as follow up to angioplasty                   indicating non compressible vessels and a right great toe                   pressure of 49 and and absent left great toe pressure 05/2021.                   Surgeon recommended left BKA, but patient declined. Performing Technologist: Sherren Kerns RVS  Examination Guidelines: A complete evaluation includes at minimum, Doppler waveform signals and systolic blood pressure reading at the level of bilateral brachial, anterior tibial, and posterior tibial arteries, when vessel segments are accessible. Bilateral testing is considered an integral part of a complete examination. Photoelectric Plethysmograph (PPG) waveforms and toe systolic pressure readings are included as required and additional duplex testing as needed. Limited examinations for reoccurring indications may be performed as noted.  ABI Findings: +---------+------------------+-----+-----------+--------+ Right    Rt Pressure (mmHg)IndexWaveform   Comment  +---------+------------------+-----+-----------+--------+ Brachial 143                    multiphasic         +---------+------------------+-----+-----------+--------+ PTA      254               1.46 biphasic            +---------+------------------+-----+-----------+--------+ DP       254               1.46 monophasic          +---------+------------------+-----+-----------+--------+ Great Toe39                0.22                     +---------+------------------+-----+-----------+--------+ +---------+------------------+-----+-----------+-------+ Left     Lt Pressure (mmHg)IndexWaveform   Comment +---------+------------------+-----+-----------+-------+ Brachial 174                    multiphasic        +---------+------------------+-----+-----------+-------+ PTA      254               1.46 monophasic          +---------+------------------+-----+-----------+-------+ DP  130               0.75 monophasic         +---------+------------------+-----+-----------+-------+ Great Toe0                 0.00                    +---------+------------------+-----+-----------+-------+ +-------+-------------+-----------+------------+------------+ ABI/TBIToday's ABI  Today's TBIPrevious ABIPrevious TBI +-------+-------------+-----------+------------+------------+ Right  1.46 non comp0.22       non comp    0.49         +-------+-------------+-----------+------------+------------+ Left   1.46 non compabsent     non comp    absent       +-------+-------------+-----------+------------+------------+ Arterial wall calcification precludes accurate ankle pressures and ABIs. Bilateral ABIs appear essentially unchanged compared to prior study on 05/2021. Right TBIs appear decreased compared to prior study on 05/2021.  Summary: Right: Resting right ankle-brachial index indicates noncompressible right lower extremity arteries. The right toe-brachial index is abnormal. Left: Resting left ankle-brachial index indicates noncompressible left lower extremity arteries. The left toe-brachial index is abnormal. *See table(s) above for measurements and observations.  Electronically signed by Lemar Livings MD on 08/20/2021 at 4:23:21 PM.    Final       LOS: 2 days    Jacquelin Hawking, MD [facility] 08/21/2021, 12:08 PM   If 7PM-7AM, please contact night-coverage [email or web address]

## 2021-08-22 ENCOUNTER — Encounter (HOSPITAL_COMMUNITY)
Admission: EM | Disposition: A | Discharge status: Home Health | Payer: Self-pay | Source: Home / Self Care | Attending: Internal Medicine

## 2021-08-22 DIAGNOSIS — I10 Essential (primary) hypertension: Secondary | ICD-10-CM | POA: Diagnosis not present

## 2021-08-22 DIAGNOSIS — E1142 Type 2 diabetes mellitus with diabetic polyneuropathy: Secondary | ICD-10-CM | POA: Diagnosis not present

## 2021-08-22 DIAGNOSIS — L03116 Cellulitis of left lower limb: Secondary | ICD-10-CM | POA: Diagnosis not present

## 2021-08-22 DIAGNOSIS — L0390 Cellulitis, unspecified: Secondary | ICD-10-CM

## 2021-08-22 DIAGNOSIS — J449 Chronic obstructive pulmonary disease, unspecified: Secondary | ICD-10-CM | POA: Diagnosis not present

## 2021-08-22 HISTORY — PX: PERIPHERAL VASCULAR BALLOON ANGIOPLASTY: CATH118281

## 2021-08-22 HISTORY — PX: ABDOMINAL AORTOGRAM W/LOWER EXTREMITY: CATH118223

## 2021-08-22 LAB — IRON AND TIBC
Iron: 35 ug/dL (ref 28–170)
Saturation Ratios: 10 % — ABNORMAL LOW (ref 10.4–31.8)
TIBC: 360 ug/dL (ref 250–450)
UIBC: 325 ug/dL

## 2021-08-22 LAB — GLUCOSE, CAPILLARY
Glucose-Capillary: 184 mg/dL — ABNORMAL HIGH (ref 70–99)
Glucose-Capillary: 169 mg/dL — ABNORMAL HIGH (ref 70–99)
Glucose-Capillary: 170 mg/dL — ABNORMAL HIGH (ref 70–99)

## 2021-08-22 LAB — VITAMIN B12: Vitamin B-12: 370 pg/mL (ref 180–914)

## 2021-08-22 LAB — FERRITIN: Ferritin: 18 ng/mL (ref 11–307)

## 2021-08-22 LAB — RETICULOCYTES
Immature Retic Fract: 14.9 % (ref 2.3–15.9)
RBC.: 3.68 MIL/uL — ABNORMAL LOW (ref 3.87–5.11)
Retic Count, Absolute: 48.6 10*3/uL (ref 19.0–186.0)
Retic Ct Pct: 1.3 % (ref 0.4–3.1)

## 2021-08-22 LAB — FOLATE: Folate: 23.6 ng/mL (ref >5.9)

## 2021-08-22 SURGERY — ABDOMINAL AORTOGRAM W/LOWER EXTREMITY
Anesthesia: LOCAL | Laterality: Left

## 2021-08-22 MED ORDER — MIDAZOLAM HCL 2 MG/2ML IJ SOLN
INTRAMUSCULAR | Status: DC | PRN
  Administered 2021-08-22: 1 mg via INTRAVENOUS

## 2021-08-22 MED ORDER — FENTANYL CITRATE (PF) 100 MCG/2ML IJ SOLN
INTRAMUSCULAR | Status: DC | PRN
  Administered 2021-08-22: 25 ug via INTRAVENOUS
  Administered 2021-08-22: 50 ug via INTRAVENOUS
  Administered 2021-08-22 (×2): 25 ug via INTRAVENOUS

## 2021-08-22 MED ORDER — SODIUM CHLORIDE 0.9 % WEIGHT BASED INFUSION
1.0000 mL/kg/h | INTRAVENOUS | Status: AC
  Administered 2021-08-22: 1 mL/kg/h via INTRAVENOUS

## 2021-08-22 MED ORDER — IODIXANOL 320 MG/ML IV SOLN
INTRAVENOUS | Status: DC | PRN
  Administered 2021-08-22: 210 mL

## 2021-08-22 MED ORDER — SODIUM CHLORIDE 0.9% FLUSH
3.0000 mL | Freq: Two times a day (BID) | INTRAVENOUS | Status: DC
Start: 2021-08-23 — End: 2021-08-27
  Administered 2021-08-24 – 2021-08-27 (×5): 3 mL via INTRAVENOUS

## 2021-08-22 MED ORDER — LABETALOL HCL 5 MG/ML IV SOLN: 10.0000 mg | INTRAVENOUS | Status: DC | PRN

## 2021-08-22 MED ORDER — FENTANYL CITRATE (PF) 100 MCG/2ML IJ SOLN
INTRAMUSCULAR | Status: DC | PRN
  Administered 2021-08-22: 25 ug via INTRAVENOUS

## 2021-08-22 MED ORDER — MIDAZOLAM HCL 2 MG/2ML IJ SOLN
INTRAMUSCULAR | Status: DC | PRN
  Administered 2021-08-22: 2 mg via INTRAVENOUS
  Administered 2021-08-22 (×3): 1 mg via INTRAVENOUS

## 2021-08-22 MED ORDER — MIDAZOLAM HCL 2 MG/2ML IJ SOLN
INTRAMUSCULAR | Status: AC
  Filled 2021-08-22: qty 2

## 2021-08-22 MED ORDER — HEPARIN (PORCINE) IN NACL 1000-0.9 UT/500ML-% IV SOLN
INTRAVENOUS | Status: DC | PRN
  Administered 2021-08-22 (×2): 500 mL

## 2021-08-22 MED ORDER — SODIUM CHLORIDE 0.9 % IV SOLN: INTRAVENOUS | Status: DC

## 2021-08-22 MED ORDER — LIDOCAINE HCL (PF) 1 % IJ SOLN
INTRAMUSCULAR | Status: AC
  Filled 2021-08-22: qty 30

## 2021-08-22 MED ORDER — ONDANSETRON HCL 4 MG/2ML IJ SOLN: 4.0000 mg | Freq: Four times a day (QID) | INTRAMUSCULAR | Status: DC | PRN

## 2021-08-22 MED ORDER — HYDRALAZINE HCL 20 MG/ML IJ SOLN: 5.0000 mg | INTRAMUSCULAR | Status: DC | PRN

## 2021-08-22 MED ORDER — HEPARIN SODIUM (PORCINE) 1000 UNIT/ML IJ SOLN
INTRAMUSCULAR | Status: AC
  Filled 2021-08-22: qty 10

## 2021-08-22 MED ORDER — FENTANYL CITRATE (PF) 100 MCG/2ML IJ SOLN
INTRAMUSCULAR | Status: AC
  Filled 2021-08-22: qty 2

## 2021-08-22 MED ORDER — SODIUM CHLORIDE 0.9 % IV SOLN: 250.0000 mL | INTRAVENOUS | Status: DC | PRN

## 2021-08-22 MED ORDER — ACETAMINOPHEN 325 MG PO TABS
650.0000 mg | ORAL_TABLET | ORAL | Status: DC | PRN
  Administered 2021-08-22 – 2021-08-25 (×3): 650 mg via ORAL
  Filled 2021-08-22 (×3): qty 2

## 2021-08-22 MED ORDER — HEPARIN SODIUM (PORCINE) 1000 UNIT/ML IJ SOLN
INTRAMUSCULAR | Status: DC | PRN
  Administered 2021-08-22: 1000 [IU] via INTRAVENOUS
  Administered 2021-08-22: 8000 [IU] via INTRAVENOUS
  Administered 2021-08-22: 1000 [IU] via INTRAVENOUS

## 2021-08-22 MED ORDER — SODIUM CHLORIDE 0.9% FLUSH: 3.0000 mL | INTRAVENOUS | Status: DC | PRN

## 2021-08-22 MED ORDER — LIDOCAINE HCL (PF) 1 % IJ SOLN
INTRAMUSCULAR | Status: DC | PRN
  Administered 2021-08-22: 2 mL
  Administered 2021-08-22: 12 mL

## 2021-08-22 SURGICAL SUPPLY — 29 items
BALLN STERLING OTW 3X100X150 (BALLOONS) ×3
BALLOON STERLING OTW 3X100X150 (BALLOONS) IMPLANT
BNDG HMST LF TOP THROMBIX (HEMOSTASIS) ×2
CATH CXI 2.6F 65 ANG (CATHETERS) ×3
CATH NAVICROSS ST 65CM (CATHETERS) IMPLANT
CATH OMNI FLUSH 5F 65CM (CATHETERS) ×1 IMPLANT
CATH SPRT ANG 65X2.3FR PLATN (CATHETERS) IMPLANT
CATHETER NAVICROSS ST 65CM (CATHETERS) ×3
DEVICE ONE SNARE 10MM (MISCELLANEOUS) ×1 IMPLANT
DEVICE VASC CLSR CELT ART 6 (Vascular Products) ×1 IMPLANT
GLIDEWIRE NITREX 0.018X80X5 (WIRE) ×6
GUIDEWIRE ANGLED .035X150CM (WIRE) ×1 IMPLANT
GUIDEWIRE NITREX 0.018X80X5 (WIRE) IMPLANT
KIT ENCORE 26 ADVANTAGE (KITS) ×1 IMPLANT
KIT MICROPUNCTURE NIT STIFF (SHEATH) ×1 IMPLANT
KIT PV (KITS) ×3 IMPLANT
PATCH THROMBIX TOPICAL PLAIN (HEMOSTASIS) ×1 IMPLANT
SHEATH GLIDE SLENDER 4/5FR (SHEATH) ×1 IMPLANT
SHEATH MICROPUNCTURE PEDAL 4FR (SHEATH) ×1 IMPLANT
SHEATH MICROPUNCTURE PEDAL 5FR (SHEATH) ×1 IMPLANT
SHEATH PINNACLE 5F 10CM (SHEATH) ×1 IMPLANT
SHEATH PINNACLE 6F 10CM (SHEATH) ×1 IMPLANT
SHEATH PINNACLE ST 6F 65CM (SHEATH) ×1 IMPLANT
SHEATH PROBE COVER 6X72 (BAG) ×1 IMPLANT
SYR MEDRAD MARK V 150ML (SYRINGE) ×1 IMPLANT
TRANSDUCER W/STOPCOCK (MISCELLANEOUS) ×3 IMPLANT
TRAY PV CATH (CUSTOM PROCEDURE TRAY) ×3 IMPLANT
WIRE BENTSON .035X145CM (WIRE) ×1 IMPLANT
WIRE G V18X300CM (WIRE) ×2 IMPLANT

## 2021-08-22 NOTE — Progress Notes (Signed)
Placed patient on CPAP for HS. Auto settings (max 18 min 8)

## 2021-08-22 NOTE — Progress Notes (Signed)
Subjective: 66 year old female admitted to the hospital for cellulitis, wound of her left foot.  She previously has been treated by vascular surgery as well as podiatry at [facility].  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  She previously underwent balloon angioplasty of her anterior tibial artery [facility] in February of this year prior to surgery.  States she underwent angio with vascular surgery.  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  April recannulized anterior tibial artery with balloon angioplasty.  Limited options for revascularization remain.  She did not get the MRI yet as she states that she had to be medicated before going down so therefore the MRI was not done today.  Objective: AAO x3, NAD Necrotic changes present of the fifth metatarsal head with localized edema and erythema.  Erythema appears to be improved today.  No fluctuation crepitation.  Eschar noted along the hallux and medial first metatarsal head as well.   No pain with calf compression, swelling, warmth, erythema  Assessment:  Ulceration left foot, PAD  Plan: Await MRI before pursuing any further surgical intervention of the foot.  Continue IV antibiotics for now.  Will reassess tomorrow after the MRI.   Ovid Curd, DPM

## 2021-08-22 NOTE — TOC Initial Note (Addendum)
Transition of Care [facility]) - Initial/Assessment Note    Patient Details  Name: Anna [name] MRN: [id number] Date of Birth: [birth year]  Transition of Care [facility]) CM/SW Contact:    Tom-Johnson, Renea Ee, RN Phone Number: 08/22/2021, 8:37 AM  Clinical Narrative:                  CM spoke with patient at bedside about needs for post hospital transition. Admitted for Left foot Cellulitis. Podiatry and Vascular following. Possible amputation. Recent amputation of left fifth toe.  From home with grand daughter and great grand daughter. Has three living adult children. Patient states she has been wheelchair bound since amputation of her toe. She can stand and pivot during transfers. [place] daughter assists with care.  Has a walker, wheelchair, shower chair, hosp bed and bedside commode at home.  Patient states [facility] with [facility] [place], [place] but has [facility] and services should start on 08/24/21. Has a scheduled appointment for assessment on 09/05/21. CM contacted Demetrio Lapping [phone]) at [place] and she states patient's enrollment starts on 06/01 and will find out who her SW and Provider will be as there is none noted at this time. States she will update CM with findings. CM notified Tracie about HHPT recommended and she states [facility] will provide disciplines. CM will continue to follow with needs.   Expected Discharge Plan: [place] Barriers to Discharge: Continued Medical Work up   Patient Goals and CMS Choice Patient states their goals for this hospitalization and ongoing recovery are:: To return home CMS Medicare.gov Compare Post Acute Care list provided to:: Patient Choice offered to / list presented to : Patient  Expected Discharge Plan and Services Expected Discharge Plan: [place]   Discharge Planning Services: CM Consult Post Acute Care Choice: [place] arrangements for  the past 2 months: Single Family Home                 DME Arranged: N/A DME Agency: NA       HH Arranged: PT, OT, RN, Disease Management [place] Agency: Other - See comment ([facility]) Date HH Agency Contacted: 08/21/21   Representative spoke with at [place]: Demetrio Lapping, SW  Prior Living Arrangements/Services Living arrangements for the past 2 months: [place] Lives with:: Adult Children ([place] and great grand daughter.) Patient language and need for interpreter reviewed:: Yes Do you feel safe going back to the place where you live?: Yes      Need for Family Participation in Patient Care: Yes (Comment) Care giver support system in place?: Yes (comment) Current home services: DME [place] [facility], wheelchair, hosp bed, shower chair, bedside commode.) Criminal Activity/Legal Involvement Pertinent to Current Situation/Hospitalization: No - Comment as needed  Activities of Daily Living Home Assistive Devices/Equipment: Wheelchair ADL Screening (condition at time of admission) Patient's cognitive ability adequate to safely complete daily activities?: Yes Is the patient deaf or have difficulty hearing?: No Does the patient have difficulty seeing, even when wearing glasses/contacts?: No Does the patient have difficulty concentrating, remembering, or making decisions?: No Patient able to express need for assistance with ADLs?: Yes Does the patient have difficulty dressing or bathing?: No Independently performs ADLs?: Yes (appropriate for developmental age) Does the patient have difficulty walking or climbing stairs?: Yes Weakness of Legs: Both Weakness of Arms/Hands: None  Permission Sought/Granted Permission sought to share information with :  Case Manager, Customer service manager, Family Supports Permission granted to share information with : Yes, Verbal Permission Granted  Share Information with NAME: Leana Roe  Permission granted to share info w AGENCY: [facility]        Emotional Assessment Appearance:: Appears stated age Attitude/Demeanor/Rapport: Gracious, Engaged Affect (typically observed): Accepting, Appropriate, Calm, Hopeful Orientation: : Oriented to Self, Oriented to Place, Oriented to  Time, Oriented to Situation Alcohol / Substance Use: Not Applicable Psych Involvement: No (comment)  Admission diagnosis:  Cellulitis [L03.90] Diabetic foot infection ([place]) [id number], L08.9] Patient Active Problem List   Diagnosis Date Noted   Cellulitis 08/19/2021   Hyperkalemia 08/19/2021   OSA on CPAP 08/19/2021   Pyelonephritis 06/24/2014   Diabetic neuropathy ([place])    Accelerated hypertension    Type 2 diabetes mellitus ([place])    COPD mixed type ([place])    Acute pyelonephritis 06/23/2014   PCP:  Pcp, No Pharmacy:   [place], [place] - [address] [place] [address] Phone: [phone] Fax: [phone]     Social Determinants of Health (SDOH) Interventions    Readmission Risk Interventions     View : No data to display.

## 2021-08-22 NOTE — Care Management Important Message (Signed)
Important Message  Patient Details  Name: Anna [name] MRN: [id number] Date of Birth: [birth year]   Medicare Important Message Given:  Yes     [name] 08/22/2021, 3:42 PM

## 2021-08-22 NOTE — Progress Notes (Signed)
Pharmacy Antibiotic Note  Anna [name] is a 66 y.o. female admitted on 08/19/2021 presenting with left foot infection.  Pharmacy has been consulted for vancomycin and cefepime dosing.  Renal function is stable, WBC dow to normal, and afebrile. BCx ngtd. Angiogram planned for today.   Plan: Vancomycin 1000 mg IV q24h (eAUC 443, scr 0.95) Cefepime 2g IV q8h Monitor renal function, Cx and clinical progression to narrow F/U plan for abx  Height: 5\' 2"  (157.5 cm) Weight: 73.5 kg (162 lb 0.6 oz) IBW/kg (Calculated) : 50.1  Temp (24hrs), Avg:98.6 F (37 C), Min:98.4 F (36.9 C), Max:99.2 F (37.3 C)  Recent Labs  Lab 08/19/21 1442 08/20/21 0119  WBC 11.6* 9.8  CREATININE 0.95 0.87  LATICACIDVEN 1.2  --      Estimated Creatinine Clearance: 59.7 mL/min (by C-G formula based on SCr of 0.87 mg/dL).    Allergies  Allergen Reactions   Bacitracin     Other reaction(s): Other Burns skin     Citalopram Diarrhea   Doxycycline Other (See Comments)    Other reaction(s): Other "burning" all over body     Morphine And Related Hives   Neomycin     Other reaction(s): Other (See Comments), Unknown unknown    Ciprofloxacin     Tongue swells   Ciprofloxacin-Dexamethasone Other (See Comments)    unknown    Duloxetine     Other reaction(s): Mental Status Changes (intolerance) "saw pictures of gun"   Latex Itching   Levaquin [Levofloxacin In D5w] Other (See Comments)    Pt does not remember reaction but states she's had issues with med   Piper     Other reaction(s): Other (See Comments) White pepper- uncontrollable sneezing    Salvia Officinalis     Other reaction(s): Other (See Comments) Sneezing   Soma [Carisoprodol]     Sleepy and constipation   Lasix [Furosemide] Rash   Thank you for involving pharmacy in this patient's care.  08/22/21, PharmD, BCPS Clinical Pharmacist Clinical phone for 08/22/2021 until 3p is [phone] 08/22/2021 11:39 AM  **Pharmacist phone  directory can be found on [email or web address] listed under [facility]**

## 2021-08-22 NOTE — Progress Notes (Signed)
PROGRESS NOTE    Anna [name]  ZHY:[id number] DOB: 1955-08-18 DOA: 08/19/2021 PCP: Pcp, No   Brief Narrative: Anna [name] is a 66 y.o. female with a history of diabetes mellitus, hypertension, hyperlipidemia, peripheral neuropathy, CVA, COPD, DVT, gastric bleeding, GERD, obesity, PAD, OSA on CPAP. Patient presented secondary to a left foot infection in setting of chronic foot wounds.   Assessment and Plan:  Cellulitis Associated with left foot diabetic ulcer and recent right foot fifth digit amputation. Patient started empirically on Vancomycin, Cefepime and Flagyl on admission. Blood cultures obtained on admission. Podiatry consulted on admission. ABIs with non-compressible vessels. -Continue Vancomycin, Cefepime, Flagyl -Podiatry recommendations: MRI of left foot (still pending), likely debridement and/or amputation pending results -Vascular surgery recommendation: plan for angiography on 5/30  Diabetes mellitus, type 2 Diabetic neuropathy Hemoglobin A1C of 8.2%. Patient is on metformin, Toujeo 20 units qHS, Trulicity, Jardiance, Humalog sliding scale as an outpatient. Started on Semglee 15 units and SSI on admission. -Continue gabapentin -Continue Semglee and SSI  Hypertensive urgency Primary hypertension Patient is on lisinopril, hydrochlorothiazide. Not started on admission. Blood pressure adequately controlled. -Continue hydralazine PRN  COPD -DuoNeb PRN  Hyperkalemia Patient is on potassium supplementation as an outpatient. Mild with potassium of 5.2 on admission. Resolved. -Likely discontinue potassium on discharge  Chronic anemia Stable. Anemia panel only with decreased iron saturation.  Hyperlipidemia -Continue Crestor  Anxiety -Continue Xanax PRN  GERD Patient is on famotidine and esomeprazole as an outpatient. She is also on Carafate PRN. -Continue famotidine  Restless leg syndrome -Continue home Requip  OSA on CPAP -Continue CPAP  DVT  prophylaxis: SCDs Code Status:   Code Status: Full Code Family Communication: None at bedside Disposition Plan: Discharge pending specialist recommendations in addition to outpatient antibiotics as needed likely in several days   Consultants:  Podiatry Vascular surgery  Procedures:  ABIs (5/28)  Antimicrobials: Vancomycin Cefepime Flagyl    Subjective: Patient reports some congestion. Some pain but feels like she is improved.  Objective: BP (!) 127/56 (BP Location: Right Arm)   Pulse 76   Temp 98.4 F (36.9 C) (Oral)   Resp 18   Ht  (1.575 m)   Wt 73.5 kg   SpO2 98%   BMI 29.64 kg/m   Examination:  General exam: Appears calm and comfortable Respiratory system: Clear to auscultation. Respiratory effort normal. Cardiovascular system: S1 & S2 heard, RRR. Gastrointestinal system: Abdomen is nondistended, soft and nontender. Normal bowel sounds heard. Central nervous system: Alert and oriented. No focal neurological deficits. Psychiatry: Judgement and insight appear normal. Mood & affect appropriate.    Data Reviewed: I have personally reviewed following labs and imaging studies  CBC Lab Results  Component Value Date   WBC 9.8 08/20/2021   RBC 3.68 (L) 08/22/2021   HGB 10.6 (L) 08/20/2021   HCT 34.0 (L) 08/20/2021   MCV 86.7 08/20/2021   MCH 27.0 08/20/2021   PLT 369 08/20/2021   MCHC 31.2 08/20/2021   RDW 13.5 08/20/2021   LYMPHSABS 3.6 08/19/2021   MONOABS 0.8 08/19/2021   EOSABS 0.2 08/19/2021   BASOSABS 0.1 08/19/2021     Last metabolic panel Lab Results  Component Value Date   NA 137 08/20/2021   K 4.3 08/20/2021   CL 106 08/20/2021   CO2 25 08/20/2021   BUN 12 08/20/2021   CREATININE 0.87 08/20/2021   GLUCOSE 197 (H) 08/20/2021   GFRNONAA >60 08/20/2021   GFRAA >90 06/25/2014   CALCIUM 8.8 (  L) 08/20/2021   PHOS 3.5 08/20/2021   PROT 6.5 08/20/2021   ALBUMIN 3.2 (L) 08/20/2021   BILITOT 0.5 08/20/2021   ALKPHOS 45 08/20/2021    AST 19 08/20/2021   ALT 12 08/20/2021   ANIONGAP 6 08/20/2021    GFR: Estimated Creatinine Clearance: 59.7 mL/min (by C-G formula based on SCr of 0.87 mg/dL).  Recent Results (from the past 240 hour(s))  Blood Cultures x 2 sites     Status: None (Preliminary result)   Collection Time: 08/19/21  1:49 PM   Specimen: BLOOD RIGHT HAND  Result Value Ref Range Status   Specimen Description BLOOD RIGHT HAND  Final   Special Requests   Final    BOTTLES DRAWN AEROBIC AND ANAEROBIC Blood Culture results may not be optimal due to an inadequate volume of blood received in culture bottles   Culture   Final    NO GROWTH 3 DAYS Performed at [facility], [address] [place], [place] [address]    Report Status PENDING  Incomplete  Blood Cultures x 2 sites     Status: None (Preliminary result)   Collection Time: 08/19/21  1:54 PM   Specimen: BLOOD LEFT HAND  Result Value Ref Range Status   Specimen Description BLOOD LEFT HAND  Final   Special Requests   Final    BOTTLES DRAWN AEROBIC AND ANAEROBIC Blood Culture results may not be optimal due to an inadequate volume of blood received in culture bottles   Culture   Final    NO GROWTH 3 DAYS Performed at [facility], [address], [place], [place] [address]    Report Status PENDING  Incomplete  MRSA Next Gen by PCR, Nasal     Status: None   Collection Time: 08/19/21 10:59 PM   Specimen: Urine, Clean Catch; Nasal Swab  Result Value Ref Range Status   MRSA by PCR Next Gen NOT DETECTED NOT DETECTED Final    Comment: (NOTE) The GeneXpert MRSA Assay (FDA approved for NASAL specimens only), is one component of a comprehensive MRSA colonization surveillance program. It is not intended to diagnose MRSA infection nor to guide or monitor treatment for MRSA infections. Test performance is not FDA approved in patients less than 55 years old. Performed at [facility], [address], [place], [place] [address]        Radiology Studies: VAS [place] ABI WITH/WO TBI  Result Date: 08/20/2021  LOWER EXTREMITY DOPPLER STUDY Patient Name:  Anna [name]  Date of Exam:   08/20/2021 Medical Rec #: [id number]         Accession #:    [id number] Date of Birth: 06-May-1955        Patient Gender: F Patient Age:   54 years Exam Location:  [facility] Procedure:      VAS [place] ABI WITH/WO TBI Referring Phys: ANASTASSIA DOUTOVA --------------------------------------------------------------------------------  Indications: Rest pain, ulceration, gangrene, and peripheral artery disease. High Risk Factors: Hypertension, hyperlipidemia, Diabetes.  Vascular Interventions: Balloon angioplasty of left anterior tibial artery                         05/12/21 at [facility], 5th toe amputation 06/16/21 at                         [facility],. Limitations: Today's exam was limited due to involuntary patient movement  secondary to ischemia. Comparison Study: No prior study on file at [facility]. There is mention of                   prior PVR done at [facility] as follow up to angioplasty                   indicating non compressible vessels and a right great toe                   pressure of 49 and and absent left great toe pressure 05/2021.                   Surgeon recommended left BKA, but patient declined. Performing Technologist: Sherren Kerns RVS  Examination Guidelines: A complete evaluation includes at minimum, Doppler waveform signals and systolic blood pressure reading at the level of bilateral brachial, anterior tibial, and posterior tibial arteries, when vessel segments are accessible. Bilateral testing is considered an integral part of a complete examination. Photoelectric Plethysmograph (PPG) waveforms and toe systolic pressure readings are included as required and additional duplex testing as needed. Limited examinations for reoccurring indications may be performed as noted.  ABI Findings:  +---------+------------------+-----+-----------+--------+ Right    Rt Pressure (mmHg)IndexWaveform   Comment  +---------+------------------+-----+-----------+--------+ Brachial 143                    multiphasic         +---------+------------------+-----+-----------+--------+ PTA      254               1.46 biphasic            +---------+------------------+-----+-----------+--------+ DP       254               1.46 monophasic          +---------+------------------+-----+-----------+--------+ Great Toe39                0.22                     +---------+------------------+-----+-----------+--------+ +---------+------------------+-----+-----------+-------+ Left     Lt Pressure (mmHg)IndexWaveform   Comment +---------+------------------+-----+-----------+-------+ Brachial 174                    multiphasic        +---------+------------------+-----+-----------+-------+ PTA      254               1.46 monophasic         +---------+------------------+-----+-----------+-------+ DP       130               0.75 monophasic         +---------+------------------+-----+-----------+-------+ Great Toe0                 0.00                    +---------+------------------+-----+-----------+-------+ +-------+-------------+-----------+------------+------------+ ABI/TBIToday's ABI  Today's TBIPrevious ABIPrevious TBI +-------+-------------+-----------+------------+------------+ Right  1.46 non comp0.22       non comp    0.49         +-------+-------------+-----------+------------+------------+ Left   1.46 non compabsent     non comp    absent       +-------+-------------+-----------+------------+------------+ Arterial wall calcification precludes accurate ankle pressures and ABIs. Bilateral ABIs appear essentially unchanged compared to prior study on 05/2021. Right TBIs appear decreased compared to prior study on 05/2021.  Summary: Right:  Resting right  ankle-brachial index indicates noncompressible right lower extremity arteries. The right toe-brachial index is abnormal. Left: Resting left ankle-brachial index indicates noncompressible left lower extremity arteries. The left toe-brachial index is abnormal. *See table(s) above for measurements and observations.  Electronically signed by Lemar LivingsBrandon Cain MD on 08/20/2021 at 4:23:21 PM.    Final       LOS: 3 days    Jacquelin Hawkingalph Preslee Regas, MD [facility] 08/22/2021, 2:17 PM   If 7PM-7AM, please contact night-coverage [email or web address]

## 2021-08-22 NOTE — Care Management Important Message (Signed)
Important Message  Patient Details  Name: Anna [name] MRN: [id number] Date of Birth: 1956/02/25   Medicare Important Message Given:  Yes     Orbie Pyo 08/22/2021, 3:48 PM

## 2021-08-22 NOTE — Interval H&P Note (Signed)
History and Physical Interval Note:  08/22/2021 2:46 PM  Anna [name]  has presented today for surgery, with the diagnosis of ulcer.  The various methods of treatment have been discussed with the patient and family. After consideration of risks, benefits and other options for treatment, the patient has consented to  Procedure(s): ABDOMINAL AORTOGRAM W/LOWER EXTREMITY (N/A) as a surgical intervention.  The patient's history has been reviewed, patient examined, no change in status, stable for surgery.  I have reviewed the patient's chart and labs.  Questions were answered to the patient's satisfaction.     Annamarie Major

## 2021-08-22 NOTE — Progress Notes (Signed)
PT Cancellation Note  Patient Details Name: Anna [name] MRN: [id number] DOB: [birth year]   Cancelled Treatment:    Reason Eval/Treat Not Completed: Patient at procedure or test/unavailable. Transport came to take patient to MRI, she is also having angio this pm. Patient anxious this am. Will continue to follow and see as able.     Jylan Loeza 08/22/2021, 10:03 AM

## 2021-08-22 NOTE — Progress Notes (Signed)
Physical Therapy Treatment Patient Details Name: Anna [name] MRN: [id number] DOB: 1955/08/08 Today's Date: 08/22/2021   History of Present Illness Anna [name] is a 66 y.o. female Coems to the hospital with worsening L foot swelling and redness post 5th ray amputation in March of this year; with medical history significant of  DM 2, HTN, HLD peripheral neuropathy, CVA, COPD, CAD, history of DVT in 1980s, history of gastric bleed, GERD, obesity PAD OSA on CPAP    PT Comments    Patient received in bed, she is anxious this am regarding procedure and if she will need amputation of leg. She was given xanax prior to session. She is very talkative. Patient agrees to bed mobility and exercises. She will continue to benefit from skilled PT while here to maximize strength and functional independence.        Recommendations for follow up therapy are one component of a multi-disciplinary discharge planning process, led by the attending physician.  Recommendations may be updated based on patient status, additional functional criteria and insurance authorization.  Follow Up Recommendations  Home health PT     Assistance Recommended at Discharge Intermittent Supervision/Assistance  Patient can return home with the following Help with stairs or ramp for entrance;A little help with walking and/or transfers   Equipment Recommendations  None recommended by PT    Recommendations for Other Services       Precautions / Restrictions Precautions Precautions: Fall Other Brace: Pt describes needing to wear a postop boot, which she left at home Restrictions Other Position/Activity Restrictions: heel weight bearing for comfort due to wounds     Mobility  Bed Mobility Overal bed mobility: Needs Assistance Bed Mobility: Supine to Sit     Supine to sit: Min guard Sit to supine: Min guard   General bed mobility comments: patient anxious this am about procedure and possible amputation.     Transfers                        Ambulation/Gait               General Gait Details: Held ambulation today, no boot, having angio later today to determine next steps   Stairs             Wheelchair Mobility    Modified Rankin (Stroke Patients Only)       Balance Overall balance assessment: Needs assistance Sitting-balance support: Bilateral upper extremity supported Sitting balance-Leahy Scale: Good                                      Cognition Arousal/Alertness: Awake/alert Behavior During Therapy: WFL for tasks assessed/performed Overall Cognitive Status: Within Functional Limits for tasks assessed                                          Exercises Other Exercises Other Exercises: B LE exercises to include: AP, heel slides, SLR, hip abd/add, SAQ, adductor squeezes x 10 reps each    General Comments        Pertinent Vitals/Pain Pain Assessment Pain Assessment: Faces Faces Pain Scale: Hurts little more Pain Location: L foot Pain Descriptors / Indicators: Discomfort, Shooting Pain Intervention(s): Monitored during session, Repositioned    Home Living  Prior Function            PT Goals (current goals can now be found in the care plan section) Acute Rehab PT Goals Patient Stated Goal: decrease pain PT Goal Formulation: With patient Time For Goal Achievement: 09/03/21 Potential to Achieve Goals: Good Progress towards PT goals: Progressing toward goals    Frequency    Min 3X/week      PT Plan Current plan remains appropriate    Co-evaluation              AM-PAC PT "6 Clicks" Mobility   Outcome Measure  Help needed turning from your back to your side while in a flat bed without using bedrails?: A Little Help needed moving from lying on your back to sitting on the side of a flat bed without using bedrails?: A Little Help needed moving to and from  a bed to a chair (including a wheelchair)?: A Little Help needed standing up from a chair using your arms (e.g., wheelchair or bedside chair)?: A Little Help needed to walk in hospital room?: A Lot Help needed climbing 3-5 steps with a railing? : Total 6 Click Score: 15    End of Session   Activity Tolerance: Patient tolerated treatment well Patient left: in bed;with call bell/phone within reach;with bed alarm set Nurse Communication: Mobility status;Other (comment) (patient requesting chaplain visit.) PT Visit Diagnosis: Unsteadiness on feet (R26.81);Other abnormalities of gait and mobility (R26.89);Pain Pain - Right/Left: Left Pain - part of body: Ankle and joints of foot     Time: 1100-1119 PT Time Calculation (min) (ACUTE ONLY): 19 min  Charges:  $Therapeutic Exercise: 8-22 mins                     Ja Pistole, PT, GCS 08/22/21,11:32 AM

## 2021-08-22 NOTE — Op Note (Signed)
Patient name: Anna [name] MRN: [id number] DOB: 11-08-1955 Sex: female  08/22/2021 Pre-operative Diagnosis: Left foot ulcer Post-operative diagnosis:  Same Surgeon:  Durene Cal Procedure Performed:  1.  Ultrasound-guided access, right common femoral artery  2.  Ultrasound-guided access, left anterior tibial artery  3.  Abdominal aortogram  4.  Left lower extremity runoff  5.  Angioplasty, left anterior tibial artery  6.  Conscious sedation, 113 minutes  7.  Closure device, Celt   Indications: This is a 66 year old female with nonhealing wound on her foot.  She has previously undergone percutaneous intervention at [facility].  She is coming in today for vascular valuation for limb salvage.  Procedure:  The patient was identified in the holding area and taken to room 8.  The patient was then placed supine on the table and prepped and draped in the usual sterile fashion.  A time out was called.  Conscious sedation was administered with the use of IV fentanyl and Versed under continuous physician and nurse monitoring.  Heart rate, blood pressure, and oxygen saturation were continuously monitored.  Total sedation time was 113 minutes.  Ultrasound was used to evaluate the right common femoral artery.  It was patent .  A digital ultrasound image was acquired.  A micropuncture needle was used to access the right common femoral artery under ultrasound guidance.  An 018 wire was advanced without resistance and a micropuncture sheath was placed.  The 018 wire was removed and a benson wire was placed.  The micropuncture sheath was exchanged for a 5 french sheath.  An omniflush catheter was advanced over the wire to the level of L-1.  An abdominal angiogram was obtained.  Next, using the omniflush catheter and a benson wire, the aortic bifurcation was crossed and the catheter was placed into theleft external iliac artery and left runoff was obtained.    Findings:   Aortogram: No significant renal  artery stenosis is identified.  The infrarenal abdominal aorta is widely patent.  No significant iliac stenosis bilaterally.  Right Lower Extremity: Not evaluated  Left Lower Extremity: The left common femoral, profundofemoral, and superficial femoral artery are widely patent.  There is mild luminal narrowing within the superficial femoral artery at the adductor canal.  The popliteal artery is widely patent.  There is diffuse tibial disease.  The dominant runoff down to the ankle is the peroneal which is a small artery with multiple terminal branches perfusing the foot.  The posterior tibial artery is a diminutive artery that terminates near the ankle.  The anterior tibial artery occludes near its origin and reconstitutes as a dorsalis pedis.  Intervention: After the above images were acquired the decision was made to proceed with intervention.  A 6 French 65 cm sheath was advanced over the aortic bifurcation and placed into the mid superficial femoral artery.  The patient was fully heparinized.  I then used a 018V-18 wire and a Sterling 3 x 100 balloon as a support catheter to cannulate the anterior tibial artery.  I was unable to pass the wire into the main anterior tibial artery.  I felt that pedal access was going to be required.  Therefore, next the left foot was prepped and draped sterilely.  I evaluated the dorsalis pedis artery with ultrasound.  It was very small in caliber measuring about 1.5 mm but was calcified.  The skin was anesthetized with 1% lidocaine.  The dorsalis pedis artery was then cannulated under ultrasound guidance with a micropuncture  needle.  A 018 wire was then inserted followed by placement of a pedal sheath.  With the use of a CXI catheter, the V-18 wire was able to cross the lesion in the anterior tibial artery and reenter into the popliteal artery which was confirmed with a contrast injection.  Next, I snared the V-18 wire from the 6 French sheath and brought it out through the  sheath.  Next, the anterior tibial artery was treated throughout its length using the 3 x 100 Sterling balloon.  At this point a completion angiogram was performed.  No visualization of contrast was found within the anterior tibial artery, however I did have a pulse on the artery so I felt that this was because there was no outflow.  I then remove the sheath and repeated the image.  This time there was flow down the anterior tibial artery which was a small unhealthy artery.  I therefore reinserted the balloon and repeated balloon angioplasty.  This time I was able to get better expansion of the balloon.  After balloon angioplasty, there was a significantly improved palpable pedal pulse.  I repeated angiography confirming patency of the anterior tibial artery.  At this point I did not see anything else that could be done.  I made the decision to stop.  The wires were removed.  Manual pressure was held on the anterior tibial artery for hemostasis.  I closed the right groin with a Celt.  Impression:  #1  No significant aortoiliac or outflow disease on the left.  #2  Severe tibial disease.  The peroneal artery is diminutive but is the dominant runoff vessel to the ankle where it collateralizes perfusing the foot.  The posterior tibial artery is very diminutive and terminates at the ankle.  The anterior tibial artery is occluded just beyond its origin with reconstitution at the ankle.  Via pedal access, I was able to recanalize the anterior tibial artery and treated with balloon angioplasty.  The luminal diameter was not very impressive after intervention likely secondary to the diffuse disease within the artery, however we did have a palpable pulse.  Limited options for revascularization remain.    Juleen China, M.D., [facility] [place] Office: [phone] Pager:  [phone]

## 2021-08-23 ENCOUNTER — Inpatient Hospital Stay (HOSPITAL_COMMUNITY): Payer: No Typology Code available for payment source

## 2021-08-23 ENCOUNTER — Encounter (HOSPITAL_COMMUNITY): Payer: Self-pay | Admitting: Surgery

## 2021-08-23 DIAGNOSIS — L03116 Cellulitis of left lower limb: Secondary | ICD-10-CM | POA: Diagnosis not present

## 2021-08-23 DIAGNOSIS — L0390 Cellulitis, unspecified: Secondary | ICD-10-CM

## 2021-08-23 LAB — LIPID PANEL
Cholesterol: 86 mg/dL (ref 0–200)
HDL: 42 mg/dL (ref >40)
LDL Cholesterol: 23 mg/dL (ref 0–99)
Total CHOL/HDL Ratio: 2 RATIO
Triglycerides: 107 mg/dL (ref <150)
VLDL: 21 mg/dL (ref 0–40)

## 2021-08-23 LAB — COMPREHENSIVE METABOLIC PANEL
ALT: 309 U/L — ABNORMAL HIGH (ref 0–44)
AST: 687 U/L — ABNORMAL HIGH (ref 15–41)
Albumin: 2.8 g/dL — ABNORMAL LOW (ref 3.5–5.0)
Alkaline Phosphatase: 172 U/L — ABNORMAL HIGH (ref 38–126)
Anion gap: 6 (ref 5–15)
BUN: 19 mg/dL (ref 8–23)
CO2: 23 mmol/L (ref 22–32)
Calcium: 8.4 mg/dL — ABNORMAL LOW (ref 8.9–10.3)
Chloride: 108 mmol/L (ref 98–111)
Creatinine, Ser: 0.81 mg/dL (ref 0.44–1.00)
GFR, Estimated: >60 mL/min (ref >60)
Glucose, Bld: 188 mg/dL — ABNORMAL HIGH (ref 70–99)
Potassium: 4.1 mmol/L (ref 3.5–5.1)
Sodium: 137 mmol/L (ref 135–145)
Total Bilirubin: 0.5 mg/dL (ref 0.3–1.2)
Total Protein: 5.6 g/dL — ABNORMAL LOW (ref 6.5–8.1)

## 2021-08-23 LAB — GLUCOSE, CAPILLARY
Glucose-Capillary: 166 mg/dL — ABNORMAL HIGH (ref 70–99)
Glucose-Capillary: 153 mg/dL — ABNORMAL HIGH (ref 70–99)
Glucose-Capillary: 221 mg/dL — ABNORMAL HIGH (ref 70–99)
Glucose-Capillary: 237 mg/dL — ABNORMAL HIGH (ref 70–99)

## 2021-08-23 IMAGING — MR MR FOOT*L* W/O CM
4 of 5 series · 21 of 40 positions shown · non-contrast
Comparison: Radiographs [DATE], [DATE] and [DATE]

CLINICAL DATA: Diabetic with foot swelling, osteomyelitis
suspected. History of toe amputation.

EXAM:
MRI OF THE LEFT FOOT WITHOUT CONTRAST
TECHNIQUE: Multiplanar, multisequence MR imaging of the left forefoot was
performed. No intravenous contrast was administered.

[Series 3: T1 · coronal · 4.0mm · 0.27mm/px · 4 of 33 slices shown (1 of 2)]
[im 1/33]
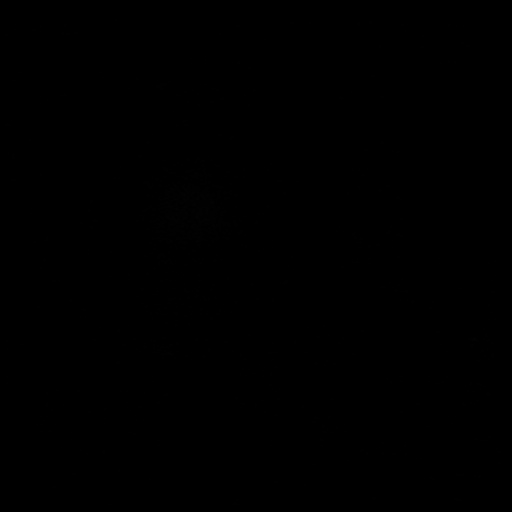
[im 4/33]
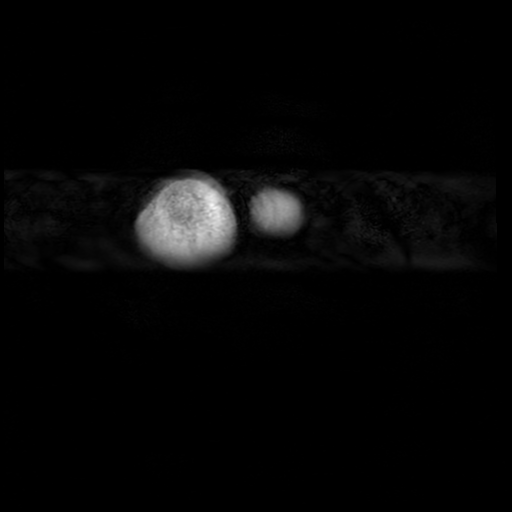
[im 18/33]
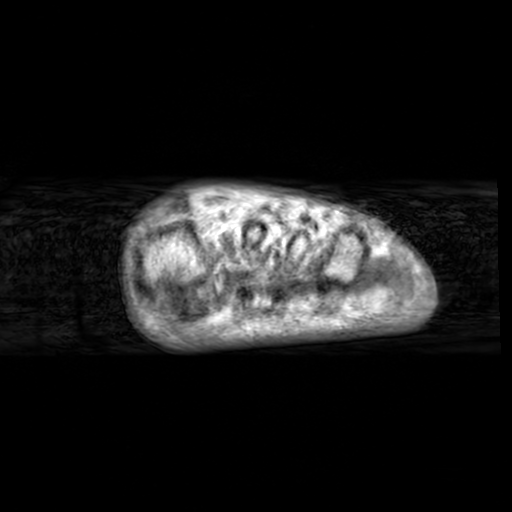
[im 29/33]
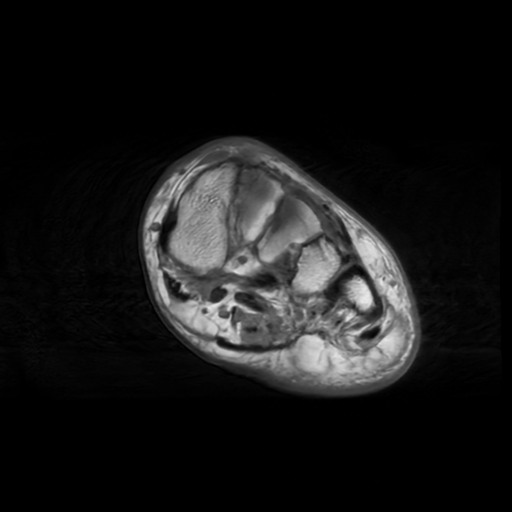

[Series 4: T2 fat-sat · coronal · 4.0mm · 0.27mm/px · 11 of 33 slices shown]
[im 1/33]
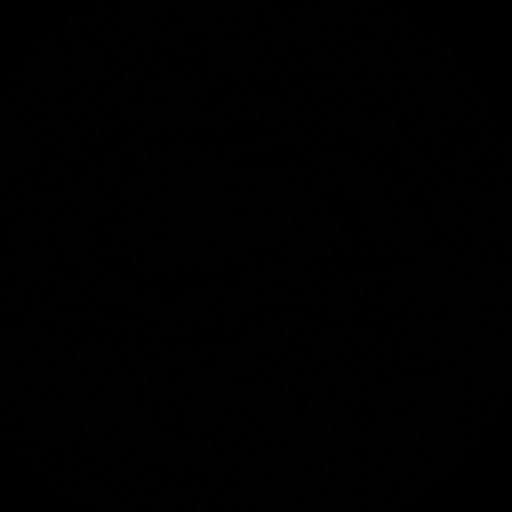
[im 4/33]
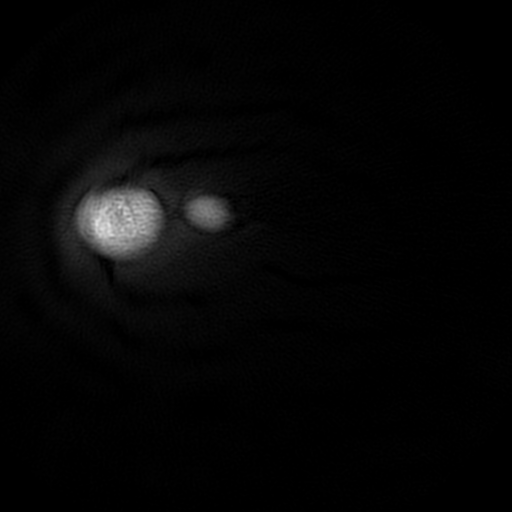
[im 7/33]
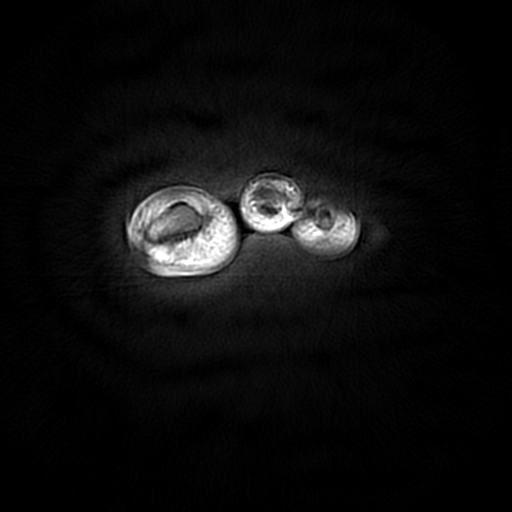
[im 10/33]
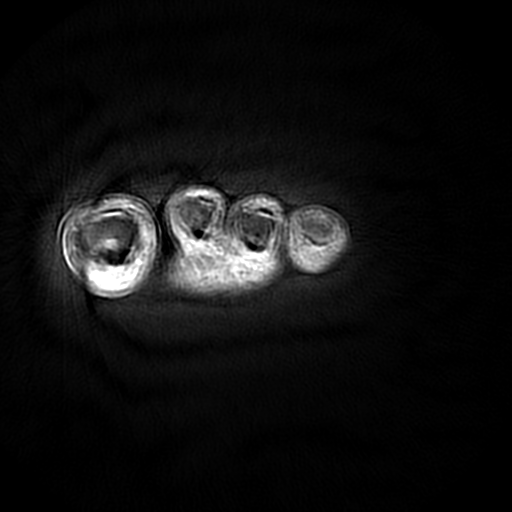
[im 13/33]
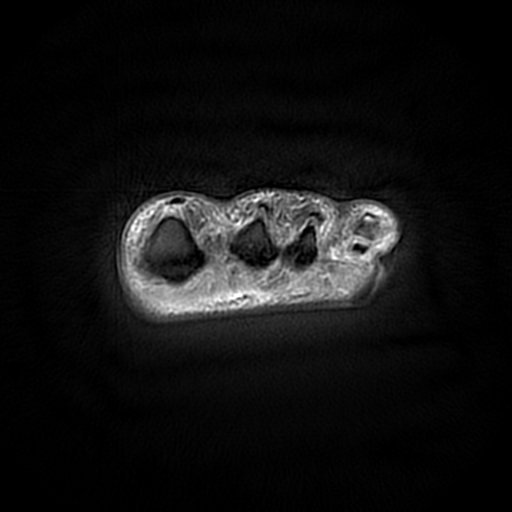
[im 17/33]
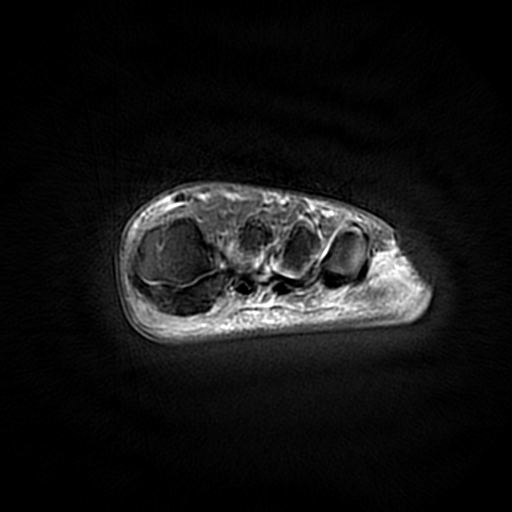
[im 20/33]
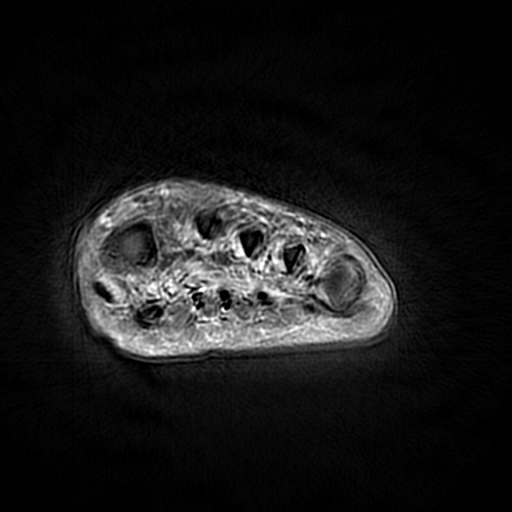
[im 23/33]
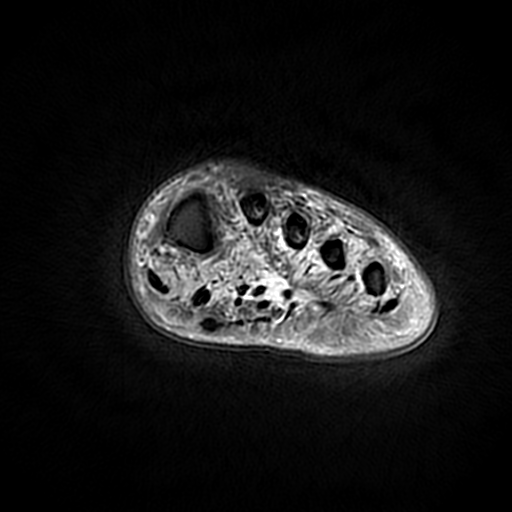
[im 26/33]
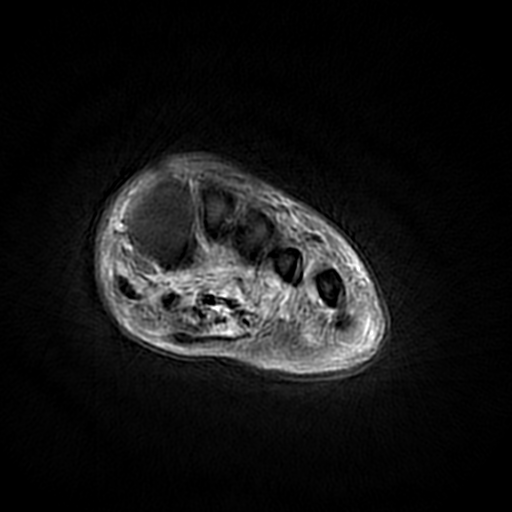
[im 29/33]
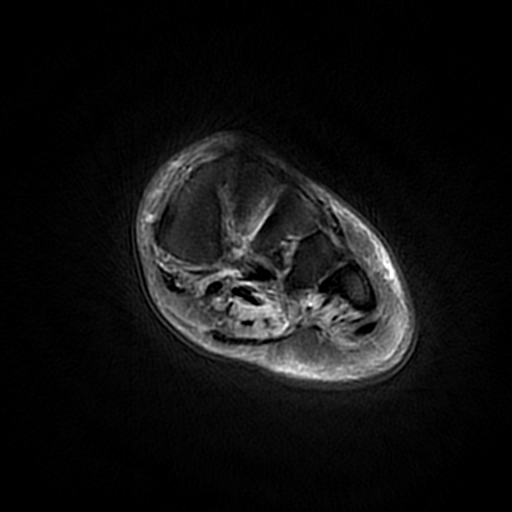
[im 33/33]
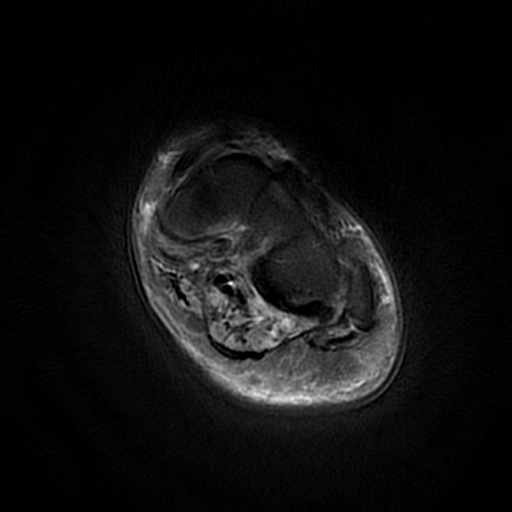

[Series 5: STIR · axial · 3.0mm · 0.55mm/px · z∈[-63,-16]mm · 3 of 18 slices shown]
[im 4/18]
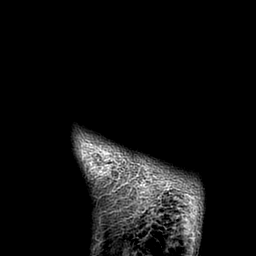
[im 11/18]
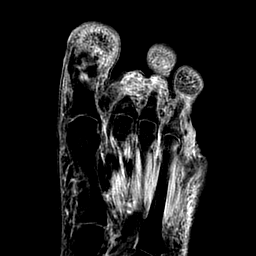
[im 18/18]
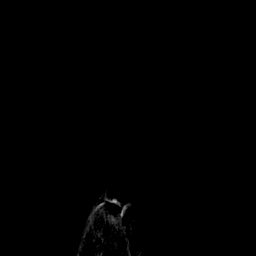

[Series 7: T1 · axial · 3.0mm · 0.31mm/px · z∈[-64,-18]mm · 3 of 18 slices shown (2 of 2)]
[im 4/18]
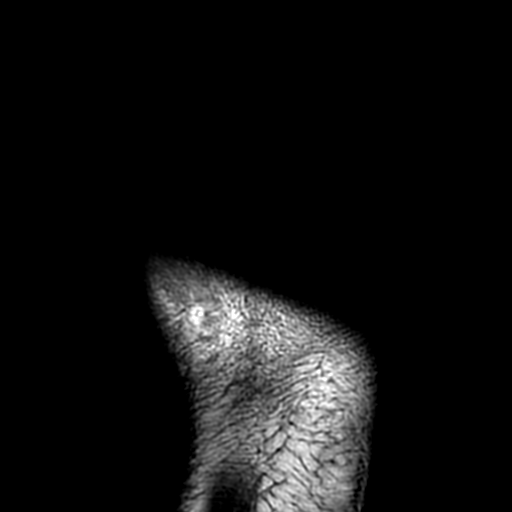
[im 11/18]
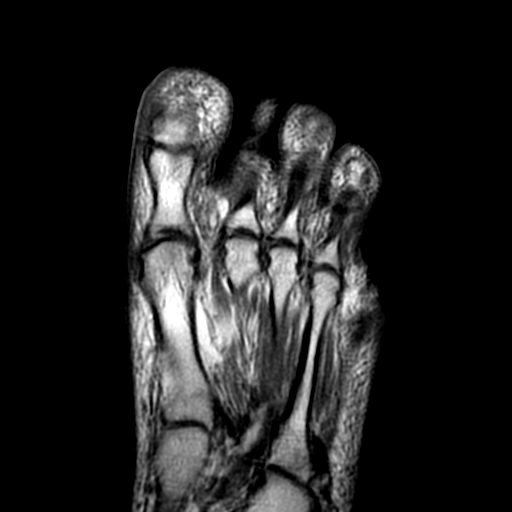
[im 18/18]
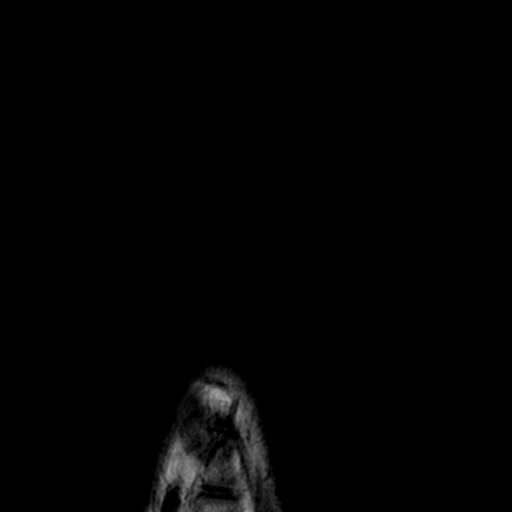

[21 of 40 positions shown; findings below may reference images not displayed]

FINDINGS: Despite efforts by the technologist and patient, mild to moderate
motion artifact is present on today's exam and could not be
eliminated. This reduces exam sensitivity and specificity.

Bones/Joint/Cartilage

Status post previous amputation of the small toe. The remaining toes
and metatarsals demonstrate no T1 marrow abnormalities or cortical
destruction. Minimal nonspecific T2 hyperintensity within the 4th
and 5th metatarsal heads which may be reactive. No significant joint
effusions or erosive changes are identified. The alignment is normal
at the Lisfranc joint.

Ligaments

Intact Lisfranc ligament. The collateral ligaments of the
metatarsophalangeal joints appear intact.

Muscles and Tendons

Mild nonspecific T2 hyperintensity within the forefoot musculature,
likely related to underlying diabetes. No significant tenosynovitis.

Soft tissues

Postsurgical defect related to previous small toe amputation with
possible mild associated soft tissue ulceration. No underlying focal
fluid collection, foreign body or soft tissue emphysema identified.
There is mild generalized subcutaneous edema throughout the
forefoot.
IMPRESSION: 1. Postsurgical changes status post previous amputation of the small
toe.
2. Possible mild soft tissue ulceration at the surgical wound
without evidence of focal fluid collection, foreign body or soft
tissue emphysema.
3. No specific evidence of osteomyelitis or septic arthritis.
Minimal nonspecific T2 marrow hyperintensity within the 4th and 5th
metatarsal heads.

## 2021-08-23 IMAGING — US US ABDOMEN LIMITED
1 series · 14 of 25 positions shown · non-contrast
Comparison: CT abdomen/pelvis dated [DATE]

CLINICAL DATA: Elevated LFTs

EXAM:
ULTRASOUND ABDOMEN LIMITED RIGHT UPPER QUADRANT

[Series 1: us abdomen limited ruq (liver/gb) · 14 of 38 slices shown]
[im 1/38]
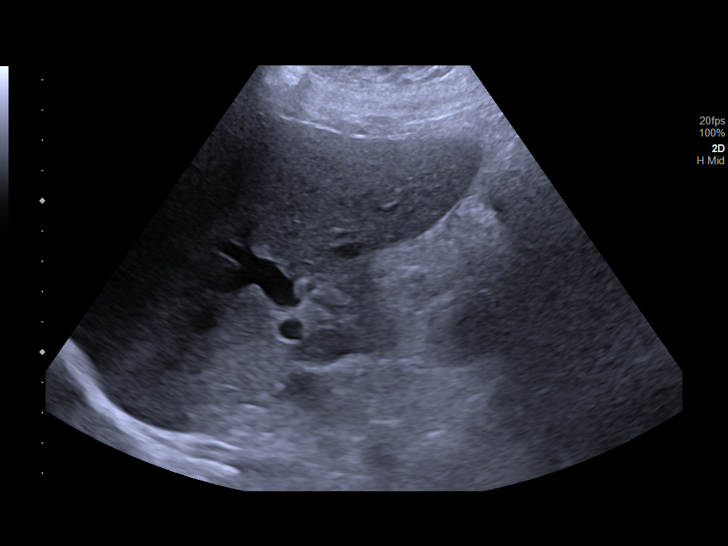
[im 4/38]
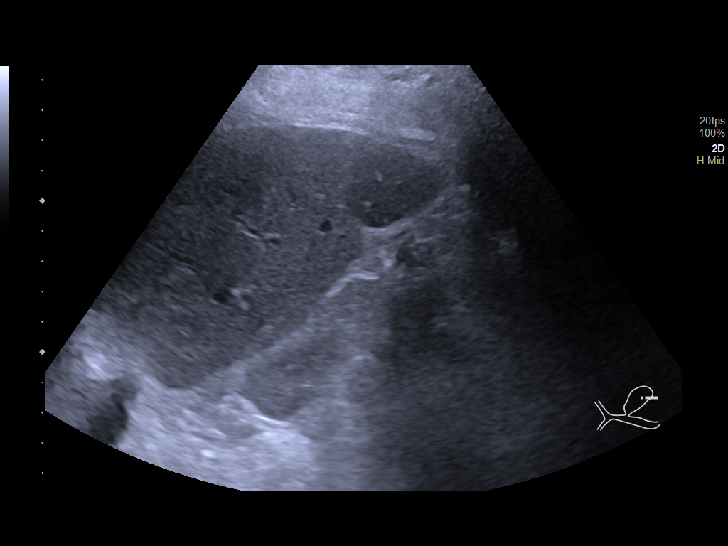
[im 7/38]
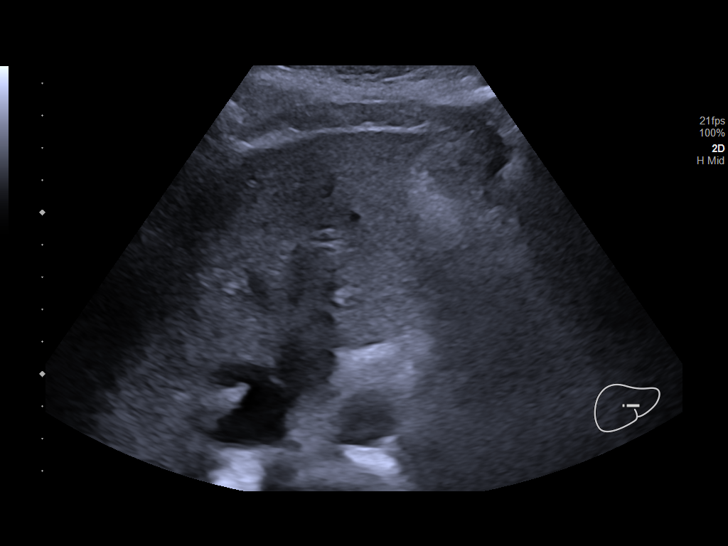
[im 10/38]
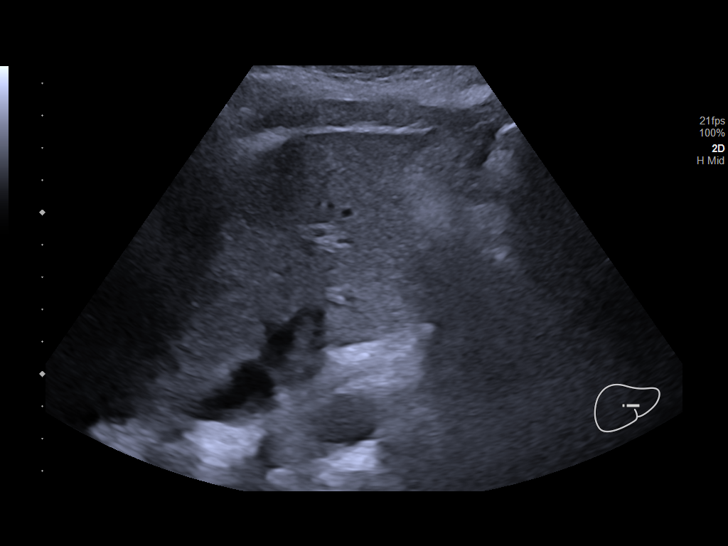
[im 13/38]
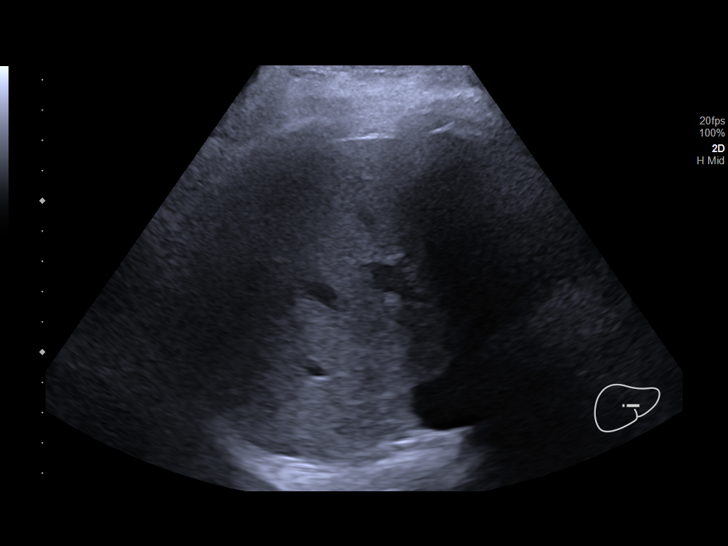
[im 14/38]
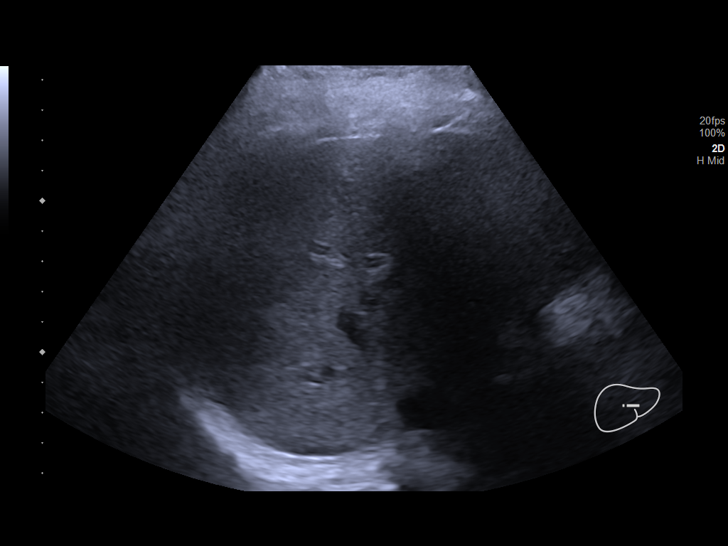
[im 17/38]
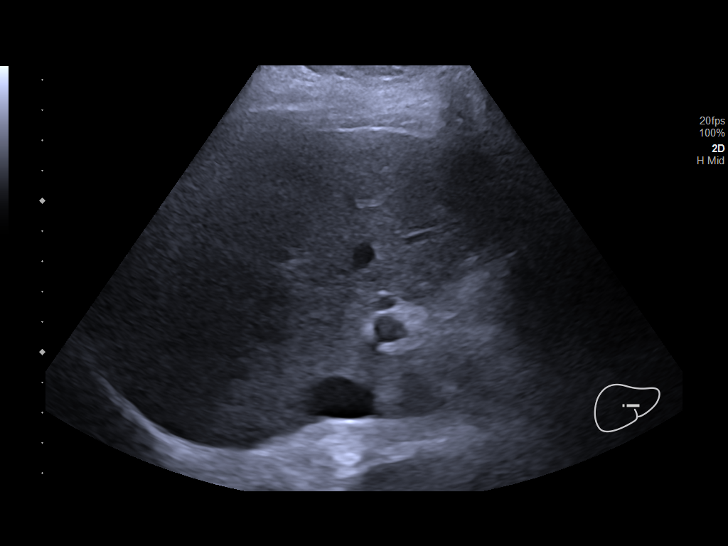
[im 21/38]
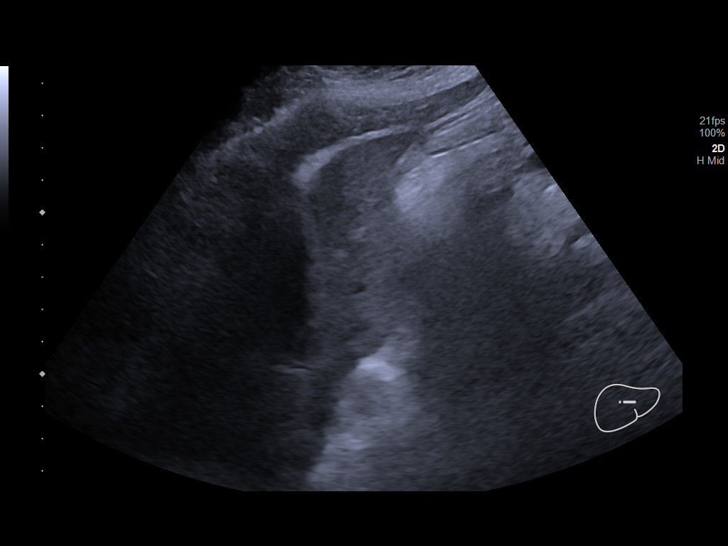
[im 24/38]
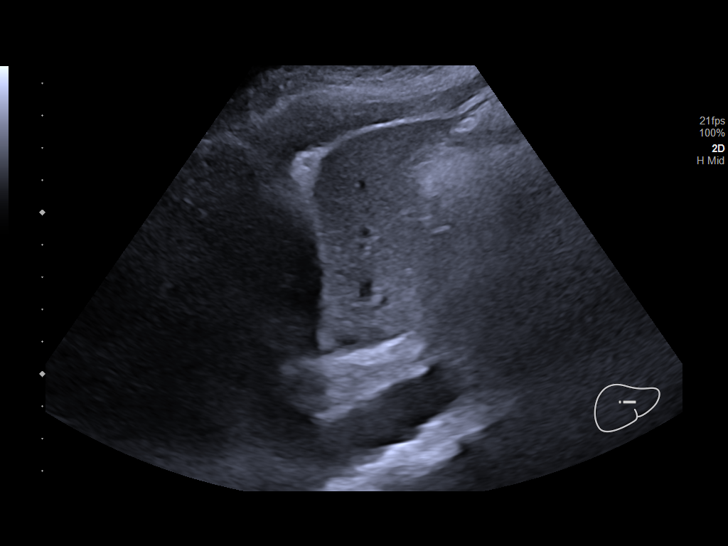
[im 25/38]
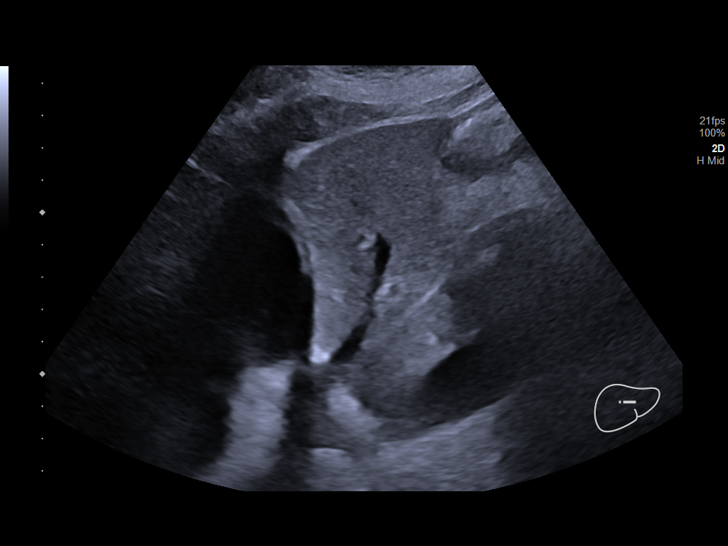
[im 28/38]
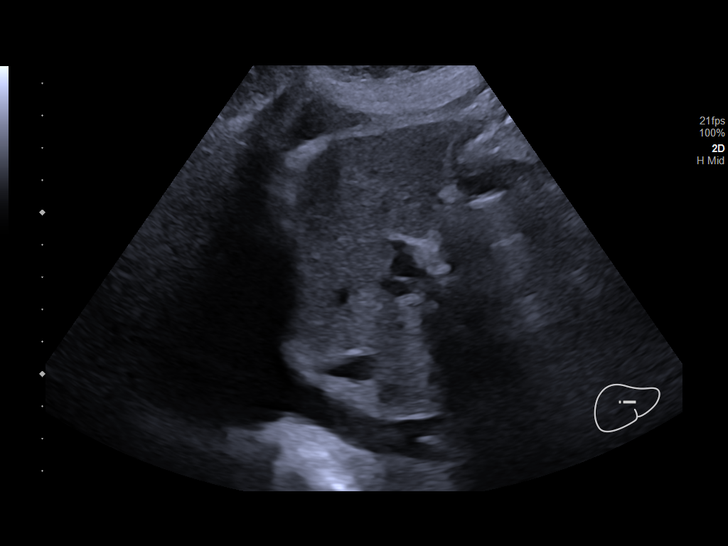
[im 31/38]
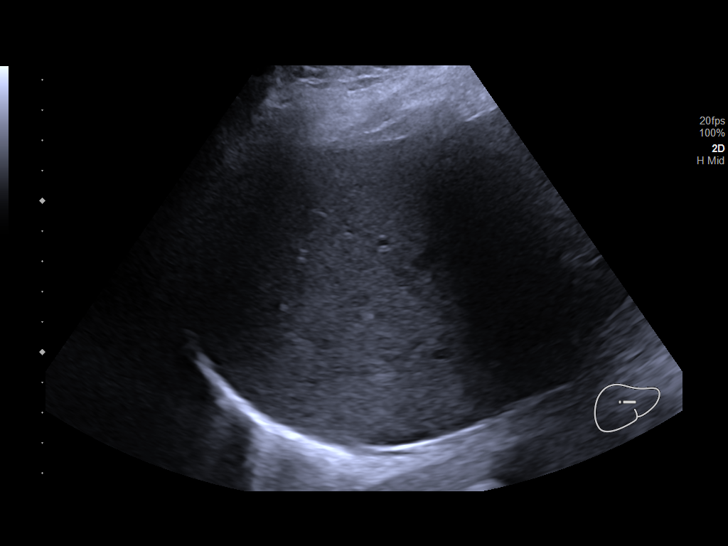
[im 34/38]
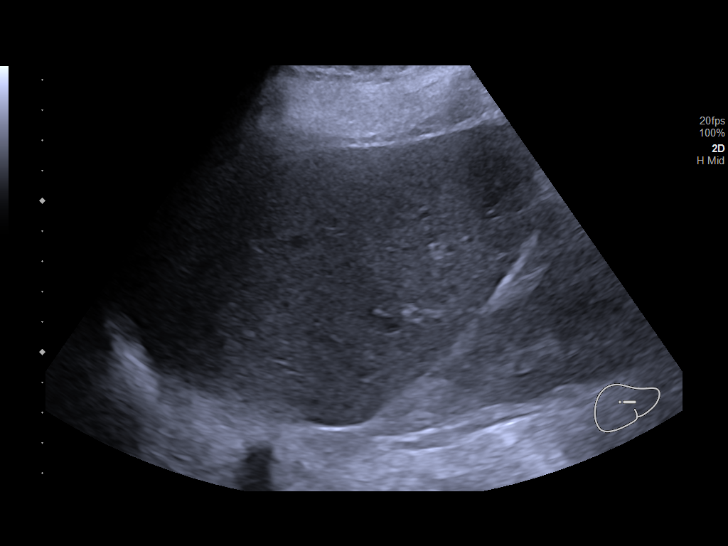
[im 38/38]
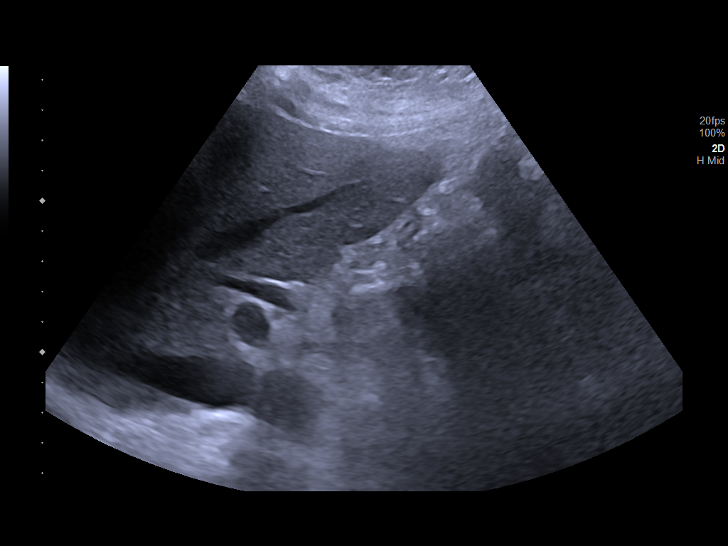

[14 of 25 positions shown; findings below may reference images not displayed]

FINDINGS: Gallbladder:

Surgically absent.

Common bile duct:

Diameter: 7 mm

Liver:

Hyperechoic hepatic parenchyma. No focal hepatic lesion is seen.
Portal vein is patent on color Doppler imaging with normal direction
of blood flow towards the liver.

Other: None.
IMPRESSION: Status post cholecystectomy.

Suspected hepatic steatosis.

## 2021-08-23 MED ORDER — CHLORHEXIDINE GLUCONATE CLOTH 2 % EX PADS
6.0000 | MEDICATED_PAD | Freq: Once | CUTANEOUS | Status: AC
Start: 2021-08-23 — End: 2021-08-23
  Administered 2021-08-23: 6 via TOPICAL

## 2021-08-23 MED ORDER — CHLORHEXIDINE GLUCONATE CLOTH 2 % EX PADS: 6.0000 | MEDICATED_PAD | Freq: Once | CUTANEOUS | Status: DC

## 2021-08-23 NOTE — Progress Notes (Addendum)
PROGRESS NOTE  Anna CarryDebbie C Roher  DOB: Aug 03, 1955  PCP: Aviva KluverPcp, No NGE:[id number]  DOA: 08/19/2021  LOS: 4 days  Hospital Day: 5  Brief narrative: Anna [name] is a 66 y.o. female with PMH significant for obesity, OSA on CPAP, DM2, HTN, HLD, CVA, PAD, GERD, gastric bleeding, peripheral neuropathy, COPD, DVT. Patient has chronic wound and ulcer of left foot due to diabetic polyneuropathy as well as poor circulation and PAD.  She underwent left fifth toe amputation on 06/16/2021 at [facility] with podiatrist. Patient presented to the ED on 5/27 with left great toe ulcer as well as worsening infection at the site of previous left fifth ray amputation. In the ED, she was found to have an adherent eschar at the site of amputation with surrounding erythema and edema.  There is also an eschar in the left great toe. X-ray of left foot did not show any evidence of acute osteomyelitis. Admitted to hospitalist service 5/30, patient underwent left lower extremity angiogram and left anterior tibial artery angioplasty. 5/31, MRI left foot did not show any evidence of osteomyelitis or septic arthritis but showed soft tissue ulceration. See below for details.  Subjective: Patient was seen and examined this morning.  Pleasant elderly Caucasian female.  Propped up in bed.  Not in distress.  No new symptoms. Waiting for for surgery tomorrow. Labs this morning with significant elevated LFTs.  Principal Problem:   Cellulitis Active Problems:   Diabetic neuropathy (HCC)   Accelerated hypertension   Type 2 diabetes mellitus (HCC)   COPD mixed type (HCC)   Hyperkalemia   OSA on CPAP    Assessment and Plan: Left foot ulcer Peripheral artery disease -Seen by vascular surgery and podiatry.   -5/30, underwent left anterior tibial artery angioplasty -No radiological evidence of osteomyelitis. -Podiatry following.  Defer to podiatry for local debridement/amputation need. -Currently on IV  vancomycin, cefepime and Flagyl.  Uncontrolled type 2 diabetes mellitus Diabetic neuropathy -A1c 8.2 on 08/12/2021 -Home meds include metformin, Toujeo 20 units nightly, Trulicity, Jardiance, sliding scale insulin -Currently on Semglee 15 units nightly along with sliding scale insulin.  Blood sugars are consistently less than 200.  Continue the same. -Continue Neurontin for neuropathy Recent Labs  Lab 08/22/21 0713 08/22/21 1113 08/22/21 2230 08/23/21 0547 08/23/21 1148  GLUCAP 184* 169* 170* 166* 153*    Elevated liver enzymes -Significant elevated liver enzymes this morning.  Unclear etiology.  Keep statin on hold -Obtain acute operative panel, liver ultrasound.  Repeat enzymes tomorrow Recent Labs  Lab 08/19/21 1442 08/20/21 0119 08/23/21 0052  AST 25 19 687*  ALT 14 12 309*  ALKPHOS 51 45 172*  BILITOT 0.6 0.5 0.5  PROT 6.9 6.5 5.6*  ALBUMIN 3.5 3.2* 2.8*   Essential hypertension -Blood pressure currently controlled remains controlled without meds.  Lisinopril HCTZ on hold.   -Continue hydralazine PRN   COPD -DuoNeb PRN   Hyperkalemia -Potassium level was elevated 5.2 on admission probably because of potassium supplement she was getting an outpatient.  Currently on hold.  Potassium level normal. Recent Labs  Lab 08/19/21 1442 08/20/21 0119 08/23/21 0052  K 5.2* 4.3 4.1  MG  --  1.8  --   PHOS  --  3.5  --     Chronic anemia -Stable hemoglobin.  Continue to monitor Recent Labs    10/01/20 0928 07/30/21 2022 08/19/21 1442 08/20/21 0119 08/22/21 0322  HGB 11.8* 9.9* 11.3* 10.6*  --   MCV 89.5 89.9 88.3 86.7  --  VITAMINB12  --   --   --   --  370  FOLATE  --   --   --   --  23.6  FERRITIN  --   --   --   --  18  TIBC  --   --   --   --  360  IRON  --   --   --   --  35  RETICCTPCT  --   --   --   --  1.3   Hyperlipidemia -Previously on Crestor.  LDL low.  Can stop Crestor.  Discussed with pharmacist   Anxiety -Continue Xanax PRN    GERD -Continue famotidine   Restless leg syndrome -Continue home Requip   OSA on CPAP -Continue CPAP  Morbid obesity  -Body mass index is 29.64 kg/m. Patient has been advised to make an attempt to improve diet and exercise patterns to aid in weight loss.  Goals of care   Code Status: Full Code    Mobility: Need PT evaluation postprocedure  Skin assessment:     Nutritional status:  Body mass index is 29.64 kg/m.  Nutrition Problem: Increased nutrient needs Etiology: wound healing Signs/Symptoms: estimated needs     Diet:  Diet Order             Diet Carb Modified Fluid consistency: Thin; Room service appropriate? Yes  Diet effective now                   DVT prophylaxis:  SCDs Start: 08/19/21 2138   Antimicrobials: IV cefepime, IV Flagyl, IV vancomycin Fluid: None Consultants: Podiatry, vascular surgery Family Communication: None at bedside  Status is: Inpatient  Continue in-hospital care because: Pending surgery tomorrow Level of care: Progressive Cardiac   Dispo: The patient is from: Home              Anticipated d/c is to: Pending surgery tomorrow              Patient currently is not medically stable to d/c.   Difficult to place patient No     Infusions:   sodium chloride     ceFEPime (MAXIPIME) IV 2 g (08/23/21 1234)   metronidazole 500 mg (08/23/21 0902)   vancomycin Stopped (08/23/21 0109)    Scheduled Meds:  aspirin EC  81 mg Oral Daily   busPIRone  25 mg Oral BID   diclofenac Sodium  2 g Topical QID   docusate sodium  100 mg Oral BID   famotidine  20 mg Oral BID   feeding supplement  237 mL Oral BID BM   FLUoxetine  80 mg Oral Daily   gabapentin  300 mg Oral TID   guaiFENesin  600 mg Oral BID   insulin aspart  0-9 Units Subcutaneous TID WC   insulin glargine-yfgn  15 Units Subcutaneous QHS   multivitamin with minerals  1 tablet Oral Daily   nutrition supplement (JUVEN)  1 packet Oral BID BM   nystatin   Topical TID    pantoprazole  40 mg Oral Daily   polyethylene glycol  17 g Oral Daily   rOPINIRole  0.25 mg Oral QHS   senna  1 tablet Oral BID   sodium chloride flush  3 mL Intravenous Q12H   topiramate  25 mg Oral Daily    PRN meds: sodium chloride, acetaminophen, albuterol, ALPRAZolam, docusate sodium, hydrALAZINE, hydrALAZINE, HYDROcodone-acetaminophen, ipratropium-albuterol, labetalol, loratadine, methocarbamol, ondansetron (ZOFRAN) IV, sodium chloride flush, traZODone  Antimicrobials: Anti-infectives (From admission, onward)    Start     Dose/Rate Route Frequency Ordered Stop   08/20/21 2230  vancomycin (VANCOCIN) IVPB 1000 mg/200 mL premix        1,000 mg 200 mL/hr over 60 Minutes Intravenous Every 24 hours 08/19/21 2146     08/19/21 2230  vancomycin (VANCOREADY) IVPB 1500 mg/300 mL        1,500 mg 150 mL/hr over 120 Minutes Intravenous  Once 08/19/21 2141 08/20/21 0056   08/19/21 2200  ceFEPIme (MAXIPIME) 2 g in sodium chloride 0.9 % 100 mL IVPB        2 g 200 mL/hr over 30 Minutes Intravenous Every 8 hours 08/19/21 1541     08/19/21 1400  metroNIDAZOLE (FLAGYL) IVPB 500 mg        500 mg 100 mL/hr over 60 Minutes Intravenous Every 12 hours 08/19/21 1352 08/26/21 0859   08/19/21 1400  ceFEPIme (MAXIPIME) 2 g in sodium chloride 0.9 % 100 mL IVPB        2 g 200 mL/hr over 30 Minutes Intravenous  Once 08/19/21 1354 08/19/21 1603       Objective: Vitals:   08/23/21 0847 08/23/21 1153  BP: (!) 117/58 (!) 110/59  Pulse: 69 70  Resp: 14 14  Temp: 97.8 F (36.6 C) 98.3 F (36.8 C)  SpO2: 100% 100%    Intake/Output Summary (Last 24 hours) at 08/23/2021 1433 Last data filed at 08/23/2021 1154 Gross per 24 hour  Intake 792.6 ml  Output 1350 ml  Net -557.4 ml   Filed Weights   08/19/21 2135  Weight: 73.5 kg   Weight change:  Body mass index is 29.64 kg/m.   Physical Exam: General exam: Pleasant, not in distress Skin: No rashes, lesions or ulcers. HEENT: Atraumatic,  normocephalic, no obvious bleeding Lungs: Clear to auscultation bilaterally CVS: Regular rate and rhythm, no murmur GI/Abd soft, nontender, nondistended, bowel sound present CNS: Alert, awake, oriented x3 Psychiatry: Mood appropriate Extremities: No pedal edema, no calf tenderness, left foot with gangrenous changes seen great toe ulcer as well as amputation site of fifth toe  Data Review: I have personally reviewed the laboratory data and studies available.  F/u labs ordered Unresulted Labs (From admission, onward)     Start     Ordered   08/24/21 0500  CBC with Differential/Platelet  Daily,   R     Question:  Specimen collection method  Answer:  Lab=Lab collect   08/23/21 1430   08/24/21 0500  Comprehensive metabolic panel  Daily,   R     Question:  Specimen collection method  Answer:  Lab=Lab collect   08/23/21 1430   08/24/21 0500  Hepatitis panel, acute  Tomorrow morning,   R       Question:  Specimen collection method  Answer:  Lab=Lab collect   08/23/21 1432            Signed, Lorin Glass, MD [facility] 08/23/2021

## 2021-08-23 NOTE — TOC Progression Note (Signed)
Transition of Care [facility]) - Progression Note    Patient Details  Name: Anna [name] MRN: [id number] Date of Birth: July 15, 1955  [facility]) CM/SW Contact  Graves-Bigelow, Lamar Laundry, RN Phone Number: 08/23/2021, 2:45 PM  Clinical Narrative:  [facility] Case Manager Calton Dach @ [phone] called to see when the patient would be ready to transition home. Case Manager Bonita Quin did state that the patient is transitioning from [facility] to [facility] [place] location on 08-24-21. Case Manager contacted provider and he states that the patient will have surgery on 08-24-21 and the earliest plan for discharge could be Friday 08-25-21. Patient will need HH PT/OT that can be facilitated at [facility]. Case Manager following the patient will need to call [facility] with updated disposition plans and the office can assist with arranging. Per [facility], they can assist with transportation as well.     Expected Discharge Plan: Home w Home Health Services Barriers to Discharge: Continued Medical Work up  Expected Discharge Plan and Services Expected Discharge Plan: Home w Home Health Services   Discharge Planning Services: CM Consult Post Acute Care Choice: Home Health Living arrangements for the past 2 months: Single Family Home                 DME Arranged: N/A DME Agency: NA       HH Arranged: PT, OT, RN, Disease Management HH Agency: Other - See comment ([facility]) Date HH Agency Contacted: 08/21/21   Representative spoke with at [facility] Agency: Valarie Cones, SW  Readmission Risk Interventions     View : No data to display.

## 2021-08-23 NOTE — Progress Notes (Addendum)
  Progress Note    08/23/2021 6:51 AM 1 Day Post-Op  Subjective:  says her foot feels better and she can tell a difference  Afebrile   Vitals:   08/23/21 0014 08/23/21 0415  BP: (!) 104/54 (!) 109/53  Pulse: 61 68  Resp: 10 20  Temp: 97.6 F (36.4 C) 97.9 F (36.6 C)  SpO2: 99% 98%    Physical Exam: General:  no distress Lungs:  non labored Incisions:  right groin is soft without hematoma Extremities:  brisk left PT>pero/AT; left foot warm  CBC    Component Value Date/Time   WBC 9.8 08/20/2021 0119   RBC 3.68 (L) 08/22/2021 0322   RBC 3.92 08/20/2021 0119   HGB 10.6 (L) 08/20/2021 0119   HCT 34.0 (L) 08/20/2021 0119   PLT 369 08/20/2021 0119   MCV 86.7 08/20/2021 0119   MCH 27.0 08/20/2021 0119   MCHC 31.2 08/20/2021 0119   RDW 13.5 08/20/2021 0119   LYMPHSABS 3.6 08/19/2021 1442   MONOABS 0.8 08/19/2021 1442   EOSABS 0.2 08/19/2021 1442   BASOSABS 0.1 08/19/2021 1442    BMET    Component Value Date/Time   NA 137 08/20/2021 0119   K 4.3 08/20/2021 0119   CL 106 08/20/2021 0119   CO2 25 08/20/2021 0119   GLUCOSE 197 (H) 08/20/2021 0119   BUN 12 08/20/2021 0119   CREATININE 0.87 08/20/2021 0119   CALCIUM 8.8 (L) 08/20/2021 0119   GFRNONAA >60 08/20/2021 0119   GFRAA >90 06/25/2014 0535    INR No results found for: INR   Intake/Output Summary (Last 24 hours) at 08/23/2021 0651 Last data filed at 08/23/2021 0419 Gross per 24 hour  Intake 1032.6 ml  Output 725 ml  Net 307.6 ml     Assessment/Plan:  66 y.o. female is s/p:  Angiogram with angioplasty of left ATA  1 Day Post-Op   -pt rest pain greatly improved this morning with brisk left PT>AT/peroneal doppler signals -pt for MRI of her foot this am-needs pre-medicating -DVT prophylaxis:  SCD's   Doreatha Massed, PA-C [facility] [phone] 08/23/2021 6:51 AM  I agree with the above.  POD#1, s/p left ATA recanalization and angioplasty.  Her foot feels better.  Brisk  left AT and peroneal signal.  Continue DAPT and statin.  Podiatry managing foot.  Durene Cal

## 2021-08-23 NOTE — H&P (View-Only) (Signed)
Subjective: 66 year old female admitted to the hospital for cellulitis, wound of her left foot.  She previously has been treated by vascular surgery as well as podiatry at [facility].  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  She previously underwent balloon angioplasty of her anterior tibial artery [facility] in February of this year prior to surgery.  States she underwent angio with vascular surgery.  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  April recannulized anterior tibial artery with balloon angioplasty.  Limited options for revascularization remain.  Had MRI this morning.   Objective: AAO x3, NAD Necrotic changes present of the fifth metatarsal head with localized edema and erythema.  Erythema improved Minimal rim remains.  No fluctuation crepitation.  Eschar noted along the hallux and medial first metatarsal head as well.   No pain with calf compression, swelling, warmth, erythema  Assessment:  Ulceration left foot, PAD  Plan: Reviewed MRI report with her.  No evidence of osteomyelitis.  However given the ongoing nature of the wound I discussed the surgery to debride the wound and possible fifth metatarsal resection wound closure versus graft application.  I discussed the surgery as well as postoperative course with her.  We discussed risk of surgery including spread of infection, delayed or nonhealing and she is still at risk of limb loss which she understands.  She said that she is not yet ready to lose her leg.  We will plan on surgery tomorrow.  We will do n.p.o. after 9 AM tomorrow.  Continue broad-spectrum antibiotics for now.  Ovid Curd, DPM

## 2021-08-23 NOTE — Progress Notes (Signed)
Occupational Therapy Treatment Patient Details Name: Anna [name] MRN: [id number] DOB: 1955-06-24 Today's Date: 08/23/2021   History of present illness Anna [name] is a 66 y.o. 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 Patient able to get to EOB with and transfer to wheelchair with stand pivot transfer with min guard assist. Patient unable to perform LB dressing without max assist and was able to perform UB bathing and dressing and grooming seated at sink. Acute OT to continue to follow.    Recommendations for follow up therapy are one component of a multi-disciplinary discharge planning process, led by the attending physician.  Recommendations may be updated based on patient status, additional functional criteria and insurance authorization.    Follow Up Recommendations  Home health OT    Assistance Recommended at Discharge Set up Supervision/Assistance  Patient can return home with the following  A lot of help with walking and/or transfers;A lot of help with bathing/dressing/bathroom;Assistance with cooking/housework;Assist for transportation;Direct supervision/assist for medications management;Direct supervision/assist for financial management;Help with stairs or ramp for entrance   Equipment Recommendations  None recommended by OT    Recommendations for Other Services      Precautions / Restrictions Precautions Precautions: Fall Required Braces or Orthoses: Other Brace Other Brace: Pt describes needing to wear a postop boot, which she left at home Restrictions Weight Bearing Restrictions: No Other Position/Activity  Restrictions: heel weight bearing for comfort due to wounds       Mobility Bed Mobility Overal bed mobility: Needs Assistance Bed Mobility: Supine to Sit     Supine to sit: Min guard     General bed mobility comments: able to get to EOB with use of rails    Transfers Overall transfer level: Needs assistance Equipment used: 1 person hand held assist Transfers: Bed to chair/wheelchair/BSC   Stand pivot transfers: Min guard         General transfer comment: min guard assist to pivot to wheelchair from EOB     Balance Overall balance assessment: Needs assistance Sitting-balance support: Bilateral upper extremity supported Sitting balance-Leahy Scale: Good         Standing balance comment: declined standing due to left foot pain                           ADL either performed or assessed with clinical judgement   ADL Overall ADL's : Needs assistance/impaired     Grooming: Set up;Sitting;Wash/dry hands;Wash/dry face;Oral care;Brushing hair;Applying deodorant Grooming Details (indicate cue type and reason): in wheelchair at sink Upper Body Bathing: Set up;Sitting Upper Body Bathing Details (indicate cue type and reason): at sink     Upper Body Dressing : Minimal assistance;Sitting Upper Body Dressing Details (indicate cue type and reason): to don gown Lower Body Dressing: Maximal assistance;Sitting/lateral leans Lower Body Dressing Details (indicate cue type and reason): to don sock               General ADL Comments: unable to donn socks at this time.    Extremity/Trunk Assessment  Vision       Perception     Praxis      Cognition Arousal/Alertness: Awake/alert Behavior During Therapy: WFL for tasks assessed/performed Overall Cognitive Status: Within Functional Limits for tasks assessed                                 General Comments: nervous about procedure        Exercises      Shoulder  Instructions       General Comments      Pertinent Vitals/ Pain       Pain Assessment Pain Assessment: Faces Faces Pain Scale: Hurts little more Pain Location: L foot Pain Descriptors / Indicators: Discomfort, Shooting Pain Intervention(s): Limited activity within patient's tolerance, Monitored during session, Repositioned  Home Living                                          Prior Functioning/Environment              Frequency  Min 2X/week        Progress Toward Goals  OT Goals(current goals can now be found in the care plan section)  Progress towards OT goals: Progressing toward goals  Acute Rehab OT Goals Patient Stated Goal: get better OT Goal Formulation: With patient Time For Goal Achievement: 09/04/21 Potential to Achieve Goals: Good ADL Goals Pt Will Perform Upper Body Dressing: with modified independence;sitting Pt Will Perform Lower Body Dressing: with min guard assist;sit to/from stand;sitting/lateral leans Pt Will Transfer to Toilet: with modified independence;stand pivot transfer;squat pivot transfer;regular height toilet;bedside commode Pt Will Perform Tub/Shower Transfer: Shower transfer;Tub transfer;with supervision;tub bench  Plan Discharge plan remains appropriate    Co-evaluation                 AM-PAC OT "6 Clicks" Daily Activity     Outcome Measure   Help from another person eating meals?: None Help from another person taking care of personal grooming?: None Help from another person toileting, which includes using toliet, bedpan, or urinal?: A Little Help from another person bathing (including washing, rinsing, drying)?: A Little Help from another person to put on and taking off regular upper body clothing?: A Little Help from another person to put on and taking off regular lower body clothing?: A Lot 6 Click Score: 19    End of Session Equipment Utilized During Treatment: Gait belt;Other (comment)  (wheelchair)  OT Visit Diagnosis: Unsteadiness on feet (R26.81);Other abnormalities of gait and mobility (R26.89);History of falling (Z91.81);Muscle weakness (generalized) (M62.81)   Activity Tolerance Patient tolerated treatment well   Patient Left in chair;with call bell/phone within reach   Nurse Communication Mobility status;Patient requests pain meds        Time: 1213-1250 OT Time Calculation (min): 37 min  Charges: OT General Charges $OT Visit: 1 Visit OT Treatments $Self Care/Home Management : 23-37 mins  Alfonse Flavors, OTA Acute Rehabilitation Services  Pager [phone] Office [phone]   Dewain Penning 08/23/2021, 2:48 PM

## 2021-08-23 NOTE — Progress Notes (Signed)
Pt states she will be ready for CPAP between 11-12 tonight.

## 2021-08-23 NOTE — Progress Notes (Signed)
Subjective: 66-year-old female admitted to the hospital for cellulitis, wound of her left foot.  She previously has been treated by vascular surgery as well as podiatry at [facility].  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  She previously underwent balloon angioplasty of her anterior tibial artery [facility] in February of this year prior to surgery.  States she underwent angio with vascular surgery.  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  April recannulized anterior tibial artery with balloon angioplasty.  Limited options for revascularization remain.  Had MRI this morning.   Objective: AAO x3, NAD Necrotic changes present of the fifth metatarsal head with localized edema and erythema.  Erythema improved Minimal rim remains.  No fluctuation crepitation.  Eschar noted along the hallux and medial first metatarsal head as well.   No pain with calf compression, swelling, warmth, erythema  Assessment:  Ulceration left foot, PAD  Plan: Reviewed MRI report with her.  No evidence of osteomyelitis.  However given the ongoing nature of the wound I discussed the surgery to debride the wound and possible fifth metatarsal resection wound closure versus graft application.  I discussed the surgery as well as postoperative course with her.  We discussed risk of surgery including spread of infection, delayed or nonhealing and she is still at risk of limb loss which she understands.  She said that she is not yet ready to lose her leg.  We will plan on surgery tomorrow.  We will do n.p.o. after 9 AM tomorrow.  Continue broad-spectrum antibiotics for now.  Giovanni Biby, DPM  

## 2021-08-23 NOTE — Progress Notes (Signed)
IV team consulted for difficult stick.  Upon arrival, pt not in bed.  RN to assist pt to get in bed for vasculature assessment, then will place new consult.

## 2021-08-23 NOTE — Progress Notes (Signed)
   08/23/21 1320  Clinical Encounter Type  Visited With Patient not available  Visit Type Initial;Spiritual support  Referral From Nurse Norman Clay, RN)  Consult/Referral To Chaplain Benetta Spar)  Recommendations Patient Requesting Prayer  Spiritual Encounters  Spiritual Needs Prayer   Spiritual Care Consultation Request by Norman Clay, RN.: "Patient Requests Prayer." This Chaplain stopped at room three times. All three times patient was on phone and requested chaplain come back later. Mentioned to Nurse Tech that I would try again at a later time. [address] [place] [phone].

## 2021-08-23 NOTE — Progress Notes (Signed)
Physical Therapy Treatment Patient Details Name: Anna [name] MRN: [id number] DOB: July 15, 1955 Today's Date: 08/23/2021   History of Present Illness ROSALEY ZAHEER is a 66 y.o. female comes to the hospital with worsening L foot swelling and redness post 5th ray amputation in March of this year; with medical history significant of  DM 2, HTN, HLD peripheral neuropathy, CVA, COPD, CAD, history of DVT in 1980s, history of gastric bleed, GERD, obesity PAD OSA on CPAP    PT Comments    Pt received in wheelchair with LLE elevated, agreeable to therapy session with goal of progressing standing tolerance/pre-gait activity and return from chair>bed. Pt c/o severe LLE pain, RN notified and gave pain meds at beginning of session. Pt self-limiting due to L foot pain but able to initiate stand pivot transfers from chair>BSC and short gait task at bedside using RW to offload painful LLE. RW adjusted for proper fit and pt reports improved comfort but still too pain limited to perform household distance gait task. Pt in supine with LLE elevated at end of session and ice pack to L foot due to painful bruising post-procedure. Pt continues to benefit from PT services to progress toward functional mobility goals.    Recommendations for follow up therapy are one component of a multi-disciplinary discharge planning process, led by the attending physician.  Recommendations may be updated based on patient status, additional functional criteria and insurance authorization.  Follow Up Recommendations  Home health PT     Assistance Recommended at Discharge Intermittent Supervision/Assistance  Patient can return home with the following Help with stairs or ramp for entrance;A little help with walking and/or transfers;A little help with bathing/dressing/bathroom;Assistance with cooking/housework;Assist for transportation   Equipment Recommendations  None recommended by PT (pending progress)    Recommendations for  Other Services       Precautions / Restrictions Precautions Precautions: Fall Required Braces or Orthoses: Other Brace Other Brace: Pt describes needing to wear a postop boot, which she left at home Restrictions Weight Bearing Restrictions: No Other Position/Activity Restrictions: heel weight bearing for comfort     Mobility  Bed Mobility Overal bed mobility: Needs Assistance Bed Mobility: Sit to Supine       Sit to supine: Supervision   General bed mobility comments: cues for lining up position better prior to return to supine, pt able to perform SLR for placement of pillow under sore LLE to float heel, also able to bridge in bed for lifting hips to pull up briefs    Transfers Overall transfer level: Needs assistance Equipment used: Rolling walker (2 wheels) Transfers: Bed to chair/wheelchair/BSC, Sit to/from Stand Sit to Stand: Min guard Stand pivot transfers: Min guard         General transfer comment: from WC>BSC with HHA to pivot toward her R side. then pt stood from [facility] with RW to walk back to bed    Ambulation/Gait Ambulation/Gait assistance: Min guard Gait Distance (Feet): 6 Feet Assistive device: Rolling walker (2 wheels) Gait Pattern/deviations: Step-to pattern, Decreased stride length, Decreased weight shift to left       General Gait Details: limited distance due to pt c/o severe L foot pain (noted bruising/edema after vascular intervention yesterday). No boot in room, pt would likely benefit from post-op shoe or some other support for comfort to progress ambulation, RN notified.   Stairs Stairs:  (pt in too much pain to attempt)             Balance Overall balance  assessment: Needs assistance Sitting-balance support: Bilateral upper extremity supported Sitting balance-Leahy Scale: Good     Standing balance support: During functional activity Standing balance-Leahy Scale: Poor Standing balance comment: pt utilized BUE support of RW and  stood <3 minutes due to L foot pain; no LOB using RW while standing                            Cognition Arousal/Alertness: Awake/alert Behavior During Therapy: WFL for tasks assessed/performed Overall Cognitive Status: Within Functional Limits for tasks assessed                                 General Comments: internally distracted due to pain        Exercises Other Exercises Other Exercises: bridging x2 reps in supine Other Exercises: supine LLE AROM: SLR, ankle pumps, SAQ x 5 reps, encouraged to her to perform throughout the day Other Exercises: STS x 2 trials for BLE strengthening    General Comments General comments (skin integrity, edema, etc.): SpO2 98% on RA, HR 75 bpm sitting between transfers, no dizziness reported      Pertinent Vitals/Pain Pain Assessment Pain Assessment: Faces Faces Pain Scale: Hurts even more Pain Location: L foot Pain Descriptors / Indicators: Discomfort, Shooting, Grimacing, Moaning Pain Intervention(s): Limited activity within patient's tolerance, Monitored during session, Repositioned, RN gave pain meds during session, Ice applied     PT Goals (current goals can now be found in the care plan section) Acute Rehab PT Goals Patient Stated Goal: decrease pain PT Goal Formulation: With patient Time For Goal Achievement: 09/03/21 Progress towards PT goals: Progressing toward goals    Frequency    Min 3X/week      PT Plan Current plan remains appropriate       AM-PAC PT "6 Clicks" Mobility   Outcome Measure  Help needed turning from your back to your side while in a flat bed without using bedrails?: None Help needed moving from lying on your back to sitting on the side of a flat bed without using bedrails?: A Little Help needed moving to and from a bed to a chair (including a wheelchair)?: A Little Help needed standing up from a chair using your arms (e.g., wheelchair or bedside chair)?: A Little Help  needed to walk in hospital room?: Total Help needed climbing 3-5 steps with a railing? : Total 6 Click Score: 15    End of Session Equipment Utilized During Treatment: Gait belt Activity Tolerance: Patient limited by pain Patient left: in bed;with call bell/phone within reach;with bed alarm set;Other (comment) (LLE elevated) Nurse Communication: Mobility status;Other (comment) (pt needs LLE post-op shoe for comfort if she can't get her boot from home, may help her progress gait distances in room) PT Visit Diagnosis: Unsteadiness on feet (R26.81);Other abnormalities of gait and mobility (R26.89);Pain Pain - Right/Left: Left Pain - part of body: Ankle and joints of foot     Time: 1434-1457 PT Time Calculation (min) (ACUTE ONLY): 23 min  Charges:  $Therapeutic Exercise: 8-22 mins $Therapeutic Activity: 8-22 mins                     Rickelle Sylvestre P., PTA Acute Rehabilitation Services Secure Chat Preferred 9a-5:30pm Office: [place] 08/23/2021, 3:27 PM

## 2021-08-23 NOTE — Progress Notes (Addendum)
PHARMACIST LIPID MONITORING  Anna [name] is a 66 y.o. female admitted on 08/19/2021 with L foot ulceration.  Pharmacy has been consulted to optimize lipid-lowering therapy with the indication of secondary prevention for clinical ASCVD.  Recent Labs:  Lipid Panel (last 6 months):   Lab Results  Component Value Date   CHOL 86 08/23/2021   TRIG 107 08/23/2021   HDL 42 08/23/2021   CHOLHDL 2.0 08/23/2021   VLDL 21 08/23/2021   LDLCALC 23 08/23/2021    Hepatic function panel (last 6 months):   Lab Results  Component Value Date   AST 19 08/20/2021   ALT 12 08/20/2021   ALKPHOS 45 08/20/2021   BILITOT 0.5 08/20/2021    SCr (since admission):   Serum creatinine: 0.87 mg/dL 32/12/24 8250 Estimated creatinine clearance: 59.7 mL/min  Current therapy and lipid therapy tolerance Current lipid-lowering therapy: rosuvastatin  Previous lipid-lowering therapies (if applicable): n/a Documented or reported allergies or intolerances to lipid-lowering therapies (if applicable): n/a  Assessment:   Patient agrees with changes to lipid-lowering therapy  Plan:    1.Statin intensity (high intensity recommended for all patients regardless of the LDL):  No statin changes. The patient is already on a high intensity statin.  2.Add ezetimibe (if any one of the following):   Not indicated at this time.  3.Refer to lipid clinic:   No  4.Follow-up with:  Cardiology provider - None  5.Follow-up labs after discharge:  No changes in lipid therapy, repeat a lipid panel in one year.      Thank you for allowing pharmacy to be a part of this patient's care.  Thelma Barge, PharmD Clinical Pharmacist

## 2021-08-24 ENCOUNTER — Other Ambulatory Visit: Payer: Self-pay

## 2021-08-24 ENCOUNTER — Inpatient Hospital Stay (HOSPITAL_COMMUNITY): Payer: No Typology Code available for payment source

## 2021-08-24 ENCOUNTER — Encounter (HOSPITAL_COMMUNITY)
Admission: EM | Disposition: A | Discharge status: Home Health | Payer: Self-pay | Source: Home / Self Care | Attending: Internal Medicine

## 2021-08-24 ENCOUNTER — Encounter (HOSPITAL_COMMUNITY): Payer: Self-pay | Admitting: Internal Medicine

## 2021-08-24 ENCOUNTER — Inpatient Hospital Stay (HOSPITAL_COMMUNITY): Payer: No Typology Code available for payment source | Admitting: Anesthesiology

## 2021-08-24 DIAGNOSIS — G2581 Restless legs syndrome: Secondary | ICD-10-CM | POA: Diagnosis present

## 2021-08-24 DIAGNOSIS — E1165 Type 2 diabetes mellitus with hyperglycemia: Secondary | ICD-10-CM | POA: Diagnosis present

## 2021-08-24 DIAGNOSIS — I70245 Atherosclerosis of native arteries of left leg with ulceration of other part of foot: Secondary | ICD-10-CM | POA: Diagnosis not present

## 2021-08-24 DIAGNOSIS — R471 Dysarthria and anarthria: Secondary | ICD-10-CM | POA: Diagnosis not present

## 2021-08-24 DIAGNOSIS — J449 Chronic obstructive pulmonary disease, unspecified: Secondary | ICD-10-CM | POA: Diagnosis present

## 2021-08-24 DIAGNOSIS — E11621 Type 2 diabetes mellitus with foot ulcer: Secondary | ICD-10-CM

## 2021-08-24 DIAGNOSIS — L0390 Cellulitis, unspecified: Secondary | ICD-10-CM

## 2021-08-24 DIAGNOSIS — Z7984 Long term (current) use of oral hypoglycemic drugs: Secondary | ICD-10-CM | POA: Diagnosis not present

## 2021-08-24 DIAGNOSIS — E1152 Type 2 diabetes mellitus with diabetic peripheral angiopathy with gangrene: Secondary | ICD-10-CM | POA: Diagnosis present

## 2021-08-24 DIAGNOSIS — I96 Gangrene, not elsewhere classified: Secondary | ICD-10-CM | POA: Diagnosis present

## 2021-08-24 DIAGNOSIS — F419 Anxiety disorder, unspecified: Secondary | ICD-10-CM | POA: Diagnosis present

## 2021-08-24 DIAGNOSIS — T8744 Infection of amputation stump, left lower extremity: Secondary | ICD-10-CM | POA: Diagnosis present

## 2021-08-24 DIAGNOSIS — K760 Fatty (change of) liver, not elsewhere classified: Secondary | ICD-10-CM | POA: Diagnosis present

## 2021-08-24 DIAGNOSIS — Z794 Long term (current) use of insulin: Secondary | ICD-10-CM | POA: Diagnosis not present

## 2021-08-24 DIAGNOSIS — Z6829 Body mass index (BMI) 29.0-29.9, adult: Secondary | ICD-10-CM | POA: Diagnosis not present

## 2021-08-24 DIAGNOSIS — I340 Nonrheumatic mitral (valve) insufficiency: Secondary | ICD-10-CM | POA: Diagnosis not present

## 2021-08-24 DIAGNOSIS — E11628 Type 2 diabetes mellitus with other skin complications: Secondary | ICD-10-CM | POA: Diagnosis present

## 2021-08-24 DIAGNOSIS — I160 Hypertensive urgency: Secondary | ICD-10-CM | POA: Diagnosis present

## 2021-08-24 DIAGNOSIS — G4733 Obstructive sleep apnea (adult) (pediatric): Secondary | ICD-10-CM | POA: Diagnosis present

## 2021-08-24 DIAGNOSIS — E785 Hyperlipidemia, unspecified: Secondary | ICD-10-CM | POA: Diagnosis present

## 2021-08-24 DIAGNOSIS — K219 Gastro-esophageal reflux disease without esophagitis: Secondary | ICD-10-CM | POA: Diagnosis present

## 2021-08-24 DIAGNOSIS — L97529 Non-pressure chronic ulcer of other part of left foot with unspecified severity: Secondary | ICD-10-CM

## 2021-08-24 DIAGNOSIS — L03116 Cellulitis of left lower limb: Secondary | ICD-10-CM | POA: Diagnosis present

## 2021-08-24 DIAGNOSIS — Y838 Other surgical procedures as the cause of abnormal reaction of the patient, or of later complication, without mention of misadventure at the time of the procedure: Secondary | ICD-10-CM | POA: Diagnosis present

## 2021-08-24 DIAGNOSIS — E11319 Type 2 diabetes mellitus with unspecified diabetic retinopathy without macular edema: Secondary | ICD-10-CM | POA: Diagnosis present

## 2021-08-24 DIAGNOSIS — E669 Obesity, unspecified: Secondary | ICD-10-CM | POA: Diagnosis present

## 2021-08-24 DIAGNOSIS — I10 Essential (primary) hypertension: Secondary | ICD-10-CM | POA: Diagnosis present

## 2021-08-24 DIAGNOSIS — D638 Anemia in other chronic diseases classified elsewhere: Secondary | ICD-10-CM | POA: Diagnosis present

## 2021-08-24 DIAGNOSIS — I351 Nonrheumatic aortic (valve) insufficiency: Secondary | ICD-10-CM | POA: Diagnosis not present

## 2021-08-24 DIAGNOSIS — I361 Nonrheumatic tricuspid (valve) insufficiency: Secondary | ICD-10-CM | POA: Diagnosis not present

## 2021-08-24 DIAGNOSIS — E875 Hyperkalemia: Secondary | ICD-10-CM | POA: Diagnosis present

## 2021-08-24 DIAGNOSIS — I2510 Atherosclerotic heart disease of native coronary artery without angina pectoris: Secondary | ICD-10-CM | POA: Diagnosis present

## 2021-08-24 DIAGNOSIS — E1142 Type 2 diabetes mellitus with diabetic polyneuropathy: Secondary | ICD-10-CM | POA: Diagnosis present

## 2021-08-24 HISTORY — PX: METATARSAL HEAD EXCISION: SHX5027

## 2021-08-24 HISTORY — PX: WOUND DEBRIDEMENT: SHX247

## 2021-08-24 LAB — CULTURE, BLOOD (ROUTINE X 2)
Culture: NO GROWTH
Culture: NO GROWTH

## 2021-08-24 LAB — COMPREHENSIVE METABOLIC PANEL
ALT: 172 U/L — ABNORMAL HIGH (ref 0–44)
AST: 160 U/L — ABNORMAL HIGH (ref 15–41)
Albumin: 2.7 g/dL — ABNORMAL LOW (ref 3.5–5.0)
Alkaline Phosphatase: 138 U/L — ABNORMAL HIGH (ref 38–126)
Anion gap: 3 — ABNORMAL LOW (ref 5–15)
BUN: 16 mg/dL (ref 8–23)
CO2: 26 mmol/L (ref 22–32)
Calcium: 8.6 mg/dL — ABNORMAL LOW (ref 8.9–10.3)
Chloride: 109 mmol/L (ref 98–111)
Creatinine, Ser: 0.96 mg/dL (ref 0.44–1.00)
GFR, Estimated: >60 mL/min (ref >60)
Glucose, Bld: 191 mg/dL — ABNORMAL HIGH (ref 70–99)
Potassium: 4.4 mmol/L (ref 3.5–5.1)
Sodium: 138 mmol/L (ref 135–145)
Total Bilirubin: 0.6 mg/dL (ref 0.3–1.2)
Total Protein: 5.6 g/dL — ABNORMAL LOW (ref 6.5–8.1)

## 2021-08-24 LAB — CBC WITH DIFFERENTIAL/PLATELET
Abs Immature Granulocytes: 0.02 10*3/uL (ref 0.00–0.07)
Basophils Absolute: 0.1 10*3/uL (ref 0.0–0.1)
Basophils Relative: 1 %
Eosinophils Absolute: 0.7 10*3/uL — ABNORMAL HIGH (ref 0.0–0.5)
Eosinophils Relative: 7 %
HCT: 30.2 % — ABNORMAL LOW (ref 36.0–46.0)
Hemoglobin: 9.2 g/dL — ABNORMAL LOW (ref 12.0–15.0)
Immature Granulocytes: 0 %
Lymphocytes Relative: 34 %
Lymphs Abs: 3.2 10*3/uL (ref 0.7–4.0)
MCH: 27.1 pg (ref 26.0–34.0)
MCHC: 30.5 g/dL (ref 30.0–36.0)
MCV: 89.1 fL (ref 80.0–100.0)
Monocytes Absolute: 0.8 10*3/uL (ref 0.1–1.0)
Monocytes Relative: 9 %
Neutro Abs: 4.7 10*3/uL (ref 1.7–7.7)
Neutrophils Relative %: 49 %
Platelets: 310 10*3/uL (ref 150–400)
RBC: 3.39 MIL/uL — ABNORMAL LOW (ref 3.87–5.11)
RDW: 14.2 % (ref 11.5–15.5)
WBC: 9.4 10*3/uL (ref 4.0–10.5)
nRBC: 0 % (ref 0.0–0.2)

## 2021-08-24 LAB — GLUCOSE, CAPILLARY
Glucose-Capillary: 152 mg/dL — ABNORMAL HIGH (ref 70–99)
Glucose-Capillary: 147 mg/dL — ABNORMAL HIGH (ref 70–99)
Glucose-Capillary: 106 mg/dL — ABNORMAL HIGH (ref 70–99)
Glucose-Capillary: 115 mg/dL — ABNORMAL HIGH (ref 70–99)
Glucose-Capillary: 158 mg/dL — ABNORMAL HIGH (ref 70–99)

## 2021-08-24 LAB — HEPATITIS PANEL, ACUTE
HCV Ab: NONREACTIVE
Hep A IgM: NONREACTIVE
Hep B C IgM: NONREACTIVE
Hepatitis B Surface Ag: NONREACTIVE

## 2021-08-24 IMAGING — DX DG FOOT 2V*L*
2 series · 2 of 2 positions shown · non-contrast
Comparison: [DATE]

CLINICAL DATA: Postop

EXAM:
LEFT FOOT - 2 VIEW

[foot]
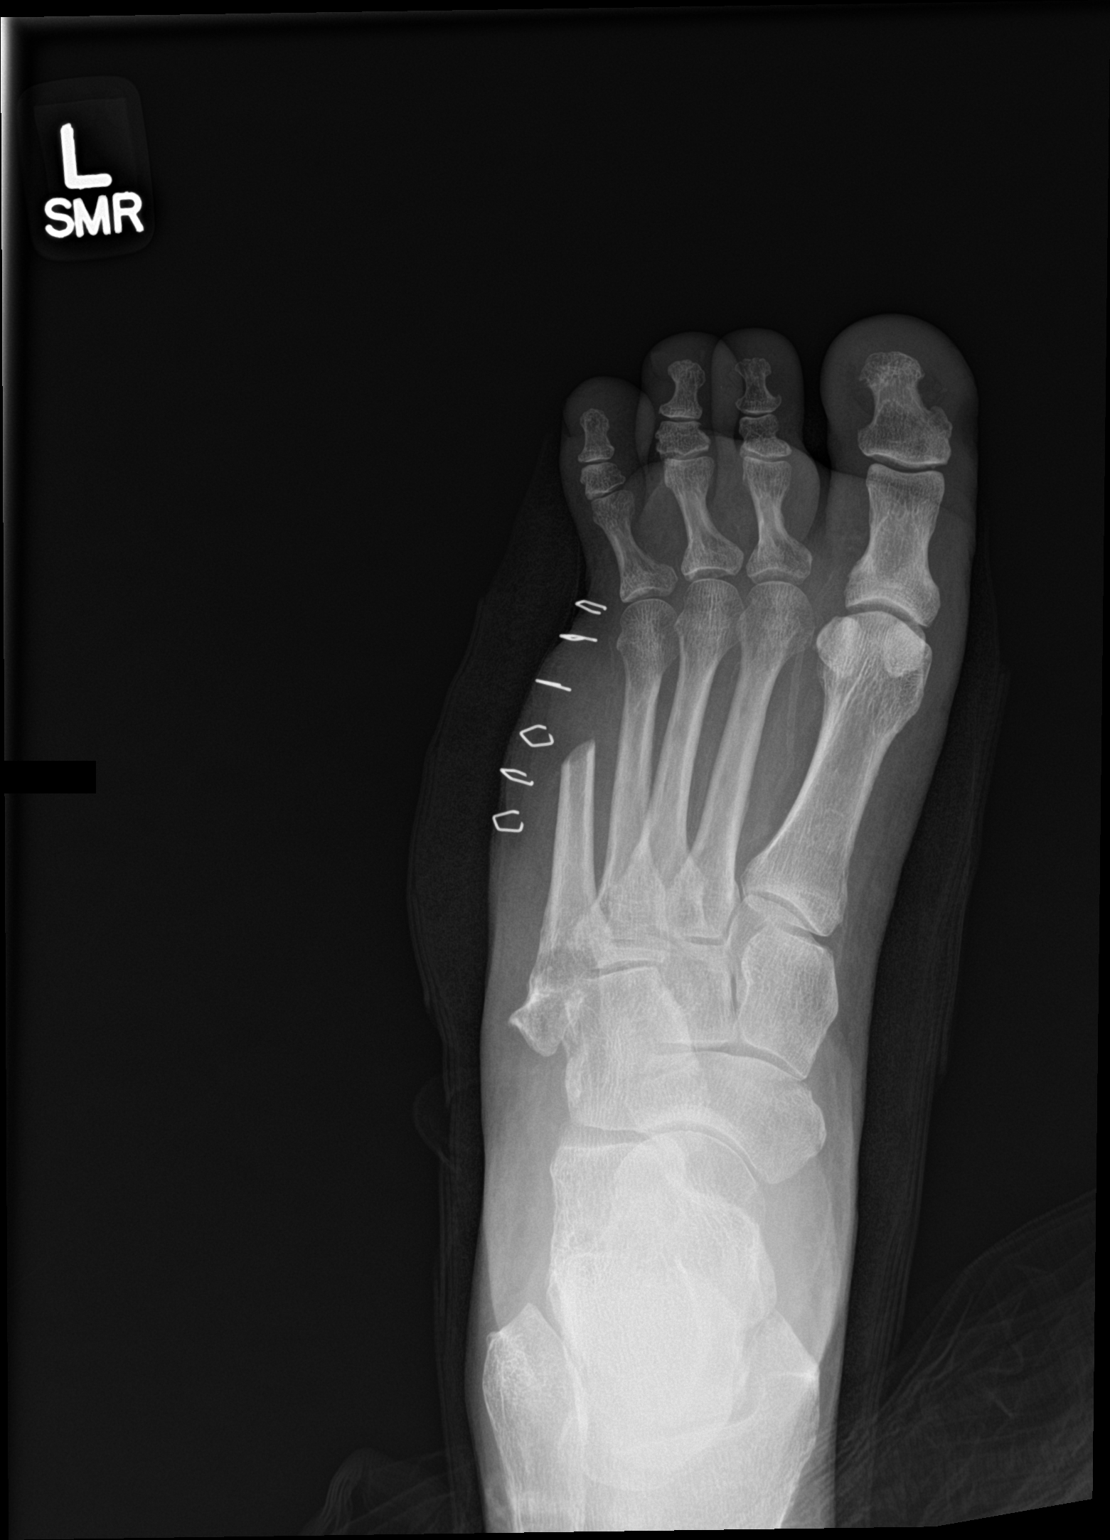

[leg]
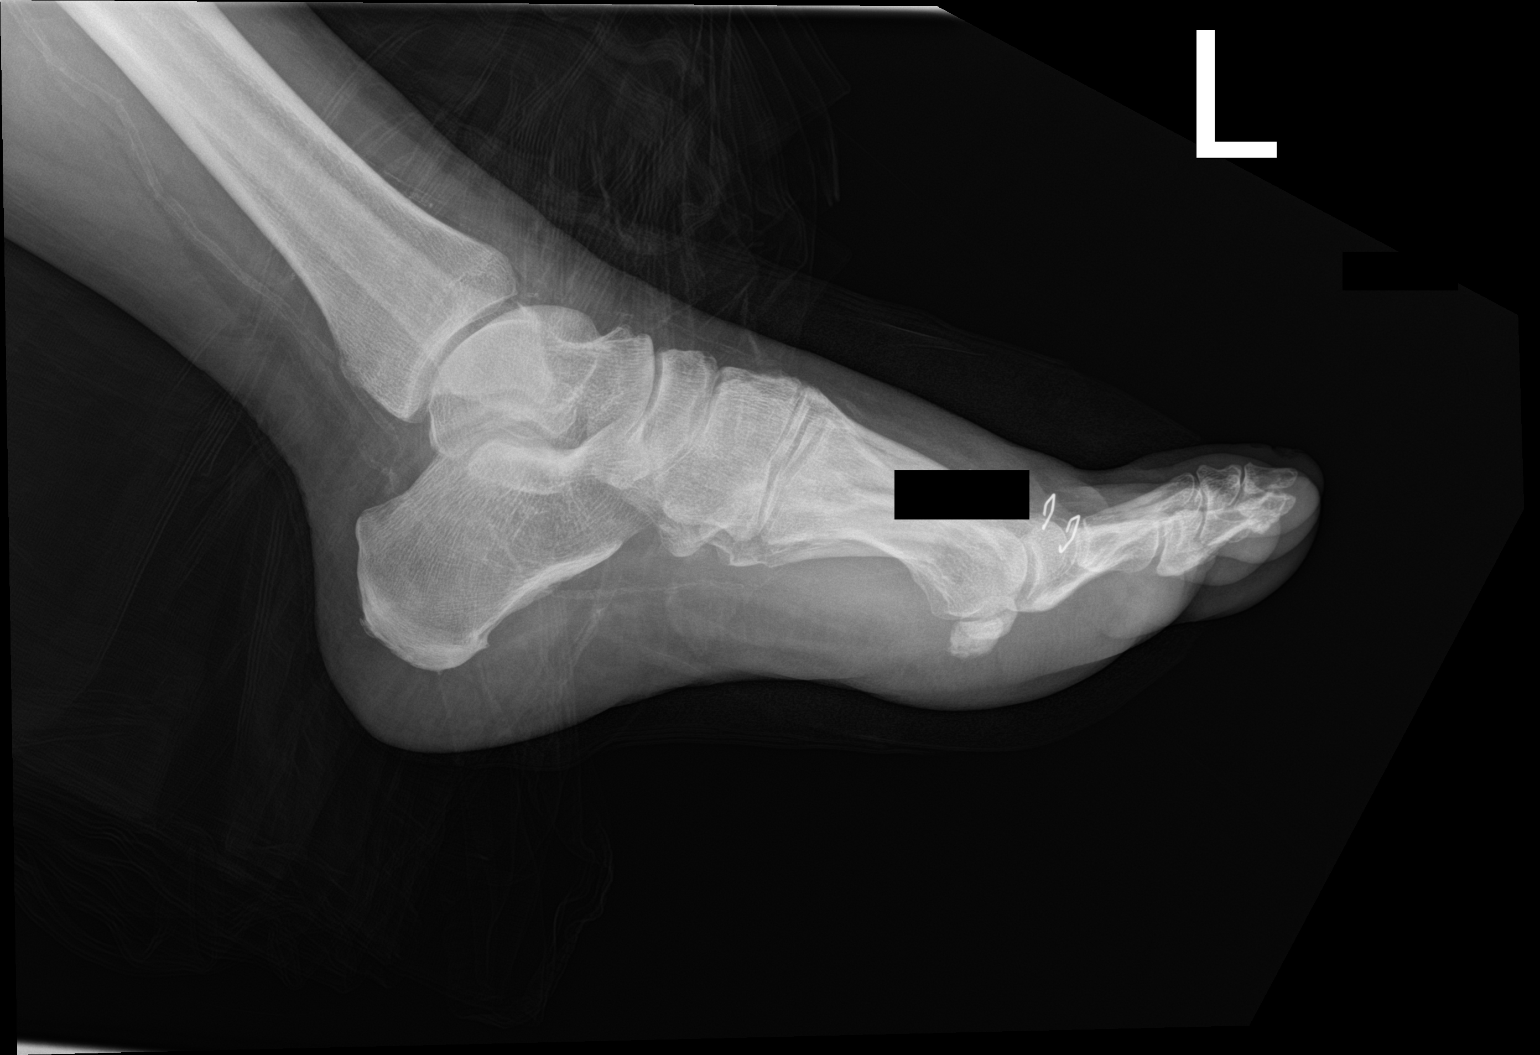

[2 of 2 positions shown; findings below may reference images not displayed]

FINDINGS: Interval 5th transmetatarsal amputation. No complicating feature. No
fracture, subluxation or dislocation. No additional bone destruction
to suggest osteomyelitis.
IMPRESSION: Left 5th transmetatarsal amputation.  No complicating feature.

## 2021-08-24 SURGERY — DEBRIDEMENT, WOUND
Anesthesia: Monitor Anesthesia Care | Site: Toe | Laterality: Left

## 2021-08-24 MED ORDER — FENTANYL CITRATE (PF) 100 MCG/2ML IJ SOLN
INTRAMUSCULAR | Status: AC
  Filled 2021-08-24: qty 2

## 2021-08-24 MED ORDER — PROPOFOL 500 MG/50ML IV EMUL
INTRAVENOUS | Status: DC | PRN
  Administered 2021-08-24: 100 ug/kg/min via INTRAVENOUS

## 2021-08-24 MED ORDER — INSULIN ASPART 100 UNIT/ML IJ SOLN: 0.0000 [IU] | INTRAMUSCULAR | Status: DC | PRN

## 2021-08-24 MED ORDER — ONDANSETRON HCL 4 MG/2ML IJ SOLN
INTRAMUSCULAR | Status: AC
  Filled 2021-08-24: qty 2

## 2021-08-24 MED ORDER — CHLORHEXIDINE GLUCONATE 0.12 % MT SOLN: 15.0000 mL | Freq: Once | OROMUCOSAL | Status: AC

## 2021-08-24 MED ORDER — CULTURELLE HEALTH & WELLNESS PO CAPS: 1.0000 | ORAL_CAPSULE | Freq: Every day | ORAL | Status: DC

## 2021-08-24 MED ORDER — BUPIVACAINE HCL 0.5 % IJ SOLN
INTRAMUSCULAR | Status: DC | PRN
  Administered 2021-08-24: 3 mL
  Administered 2021-08-24: 5 mL

## 2021-08-24 MED ORDER — ONDANSETRON HCL 4 MG/2ML IJ SOLN
INTRAMUSCULAR | Status: DC | PRN
  Administered 2021-08-24: 4 mg via INTRAVENOUS

## 2021-08-24 MED ORDER — AMISULPRIDE (ANTIEMETIC) 5 MG/2ML IV SOLN
10.0000 mg | Freq: Once | INTRAVENOUS | Status: DC | PRN
Start: 2021-08-24 — End: 2021-08-24

## 2021-08-24 MED ORDER — RISAQUAD PO CAPS
2.0000 | ORAL_CAPSULE | Freq: Every day | ORAL | Status: DC
  Administered 2021-08-24 – 2021-08-27 (×4): 2 via ORAL
  Filled 2021-08-24 (×4): qty 2

## 2021-08-24 MED ORDER — INSULIN GLARGINE-YFGN 100 UNIT/ML ~~LOC~~ SOLN
18.0000 [IU] | Freq: Every day | SUBCUTANEOUS | Status: DC
  Administered 2021-08-24 – 2021-08-26 (×3): 18 [IU] via SUBCUTANEOUS
  Filled 2021-08-24 (×4): qty 0.18

## 2021-08-24 MED ORDER — LIDOCAINE HCL 2 % IJ SOLN
INTRAMUSCULAR | Status: DC | PRN
  Administered 2021-08-24: 5 mL
  Administered 2021-08-24: 3 mL

## 2021-08-24 MED ORDER — BUPIVACAINE HCL (PF) 0.5 % IJ SOLN
INTRAMUSCULAR | Status: AC
  Filled 2021-08-24: qty 30

## 2021-08-24 MED ORDER — 0.9 % SODIUM CHLORIDE (POUR BTL) OPTIME
TOPICAL | Status: DC | PRN
  Administered 2021-08-24: 1000 mL

## 2021-08-24 MED ORDER — LIDOCAINE HCL 2 % IJ SOLN
INTRAMUSCULAR | Status: AC
  Filled 2021-08-24: qty 20

## 2021-08-24 MED ORDER — LACTATED RINGERS IV SOLN: INTRAVENOUS | Status: DC

## 2021-08-24 MED ORDER — BACID PO TABS
2.0000 | ORAL_TABLET | Freq: Every day | ORAL | Status: DC
  Filled 2021-08-24: qty 2

## 2021-08-24 MED ORDER — CHLORHEXIDINE GLUCONATE 0.12 % MT SOLN
OROMUCOSAL | Status: AC
  Administered 2021-08-24: 15 mL via OROMUCOSAL
  Filled 2021-08-24: qty 15

## 2021-08-24 MED ORDER — ORAL CARE MOUTH RINSE: 15.0000 mL | Freq: Once | OROMUCOSAL | Status: AC

## 2021-08-24 MED ORDER — FENTANYL CITRATE (PF) 100 MCG/2ML IJ SOLN
25.0000 ug | INTRAMUSCULAR | Status: DC | PRN
  Administered 2021-08-24: 25 ug via INTRAVENOUS

## 2021-08-24 MED ORDER — DEXAMETHASONE SODIUM PHOSPHATE 10 MG/ML IJ SOLN
INTRAMUSCULAR | Status: AC
  Filled 2021-08-24: qty 1

## 2021-08-24 MED ORDER — MIDAZOLAM HCL 2 MG/2ML IJ SOLN
INTRAMUSCULAR | Status: AC
  Filled 2021-08-24: qty 2

## 2021-08-24 MED ORDER — PROPOFOL 10 MG/ML IV BOLUS
INTRAVENOUS | Status: DC | PRN
  Administered 2021-08-24: 10 mg via INTRAVENOUS

## 2021-08-24 MED ORDER — LIDOCAINE 2% (20 MG/ML) 5 ML SYRINGE
INTRAMUSCULAR | Status: AC
  Filled 2021-08-24: qty 5

## 2021-08-24 MED ORDER — FENTANYL CITRATE (PF) 250 MCG/5ML IJ SOLN
INTRAMUSCULAR | Status: DC | PRN
  Administered 2021-08-24: 25 ug via INTRAVENOUS
  Administered 2021-08-24: 50 ug via INTRAVENOUS

## 2021-08-24 MED ORDER — MIDAZOLAM HCL 2 MG/2ML IJ SOLN
INTRAMUSCULAR | Status: DC | PRN
  Administered 2021-08-24: 2 mg via INTRAVENOUS

## 2021-08-24 MED ORDER — FENTANYL CITRATE (PF) 250 MCG/5ML IJ SOLN
INTRAMUSCULAR | Status: AC
  Filled 2021-08-24: qty 5

## 2021-08-24 SURGICAL SUPPLY — 41 items
BLADE AVERAGE 25X9 (BLADE) ×2 IMPLANT
BLADE SURG 10 STRL SS (BLADE) ×2 IMPLANT
BNDG COHESIVE 4X5 TAN STRL (GAUZE/BANDAGES/DRESSINGS) ×2 IMPLANT
BNDG ELASTIC 3X5.8 VLCR STR LF (GAUZE/BANDAGES/DRESSINGS) ×1 IMPLANT
BNDG GAUZE ELAST 4 BULKY (GAUZE/BANDAGES/DRESSINGS) ×1 IMPLANT
COVER SURGICAL LIGHT HANDLE (MISCELLANEOUS) ×1 IMPLANT
DRAPE U-SHAPE 47X51 STRL (DRAPES) ×4 IMPLANT
DRSG EMULSION OIL 3X3 NADH (GAUZE/BANDAGES/DRESSINGS) ×1 IMPLANT
DURAPREP 26ML APPLICATOR (WOUND CARE) ×2 IMPLANT
ELECT REM PT RETURN 9FT ADLT (ELECTROSURGICAL) ×2
ELECTRODE REM PT RTRN 9FT ADLT (ELECTROSURGICAL) ×1 IMPLANT
GAUZE SPONGE 4X4 12PLY STRL (GAUZE/BANDAGES/DRESSINGS) ×2 IMPLANT
GAUZE XEROFORM 5X9 LF (GAUZE/BANDAGES/DRESSINGS) ×2 IMPLANT
GLOVE BIO SURGEON STRL SZ8 (GLOVE) ×4 IMPLANT
GOWN STRL REUS W/ TWL LRG LVL3 (GOWN DISPOSABLE) ×1 IMPLANT
GOWN STRL REUS W/ TWL XL LVL3 (GOWN DISPOSABLE) ×1 IMPLANT
GOWN STRL REUS W/TWL LRG LVL3 (GOWN DISPOSABLE) ×2
GOWN STRL REUS W/TWL XL LVL3 (GOWN DISPOSABLE) ×2
KIT BASIN OR (CUSTOM PROCEDURE TRAY) ×2 IMPLANT
KIT TURNOVER KIT B (KITS) ×2 IMPLANT
NDL 25GX 5/8IN NON SAFETY (NEEDLE) ×1 IMPLANT
NEEDLE 25GX 5/8IN NON SAFETY (NEEDLE) ×2 IMPLANT
NS IRRIG 1000ML POUR BTL (IV SOLUTION) ×2 IMPLANT
PACK ORTHO EXTREMITY (CUSTOM PROCEDURE TRAY) ×2 IMPLANT
PAD ARMBOARD 7.5X6 YLW CONV (MISCELLANEOUS) ×4 IMPLANT
PAD CAST 4YDX4 CTTN HI CHSV (CAST SUPPLIES) ×1 IMPLANT
PADDING CAST COTTON 4X4 STRL (CAST SUPPLIES) ×2
SPONGE T-LAP 18X18 ~~LOC~~+RFID (SPONGE) ×4 IMPLANT
STAPLER VISISTAT 35W (STAPLE) ×3 IMPLANT
STOCKINETTE IMPERVIOUS LG (DRAPES) IMPLANT
SUT ETHILON 2 0 PSLX (SUTURE) IMPLANT
SUT ETHILON 3 0 PS 1 (SUTURE) ×2 IMPLANT
SUT PROLENE 3 0 PS 2 (SUTURE) ×2 IMPLANT
SWAB COLLECTION DEVICE MRSA (MISCELLANEOUS) IMPLANT
SWAB CULTURE ESWAB REG 1ML (MISCELLANEOUS) IMPLANT
SYR CONTROL 10ML LL (SYRINGE) ×2 IMPLANT
TOWEL GREEN STERILE (TOWEL DISPOSABLE) ×2 IMPLANT
TOWEL GREEN STERILE FF (TOWEL DISPOSABLE) ×2 IMPLANT
TUBE CONNECTING 12X1/4 (SUCTIONS) ×2 IMPLANT
WATER STERILE IRR 1000ML POUR (IV SOLUTION) ×2 IMPLANT
YANKAUER SUCT BULB TIP NO VENT (SUCTIONS) ×2 IMPLANT

## 2021-08-24 NOTE — Brief Op Note (Signed)
08/24/2021  6:35 PM  PATIENT:  Anna [name]  66 y.o. female  PRE-OPERATIVE DIAGNOSIS:  DIABETIC ULCER  POST-OPERATIVE DIAGNOSIS:  DIABETIC ULCER  PROCEDURE:  Procedure(s): DEBRIDEMENT WOUND OF LEFT FOOT (Left) METATARSAL HEAD EXCISION FIFTH TOE (Left)  SURGEON:  Surgeon(s) and Role:    * Vivi Barrack, DPM - Primary  PHYSICIAN ASSISTANT:   ASSISTANTS: none   ANESTHESIA:   none  EBL:  minimal   BLOOD ADMINISTERED:none  DRAINS: none   LOCAL MEDICATIONS USED:  OTHER lidocaine and marcaine plain  SPECIMEN:  No Specimen  DISPOSITION OF SPECIMEN:  N/A  COUNTS:  YES  TOURNIQUET:  * No tourniquets in log *  DICTATION: .Dragon Dictation  PLAN OF CARE: Admit to inpatient   PATIENT DISPOSITION:  PACU - hemodynamically stable.   Delay start of Pharmacological VTE agent (>24hrs) due to surgical blood loss or risk of bleeding: no  Intraoperative findings: Resected part of 5th metatarsal. Able to close wound. Minimal bleeding noted.  Recommend continue IV antibiotics with likely discharge Saturday with close monitoring. Will d/c with PO abx.

## 2021-08-24 NOTE — Progress Notes (Signed)
Orthopedic Tech Progress Note Patient Details:  Anna [name] [birth year] [id number]  Ortho Devices Type of Ortho Device: Postop shoe/boot Ortho Device/Splint Location: lle Ortho Device/Splint Interventions: Ordered      Edwina Barth 08/24/2021, 7:04 PM

## 2021-08-24 NOTE — Interval H&P Note (Signed)
History and Physical Interval Note:  08/24/2021 5:37 PM  Anna [name]  has presented today for surgery, with the diagnosis of DIABETIC ULCER.  The various methods of treatment have been discussed with the patient and family. After consideration of risks, benefits and other options for treatment, the patient has consented to  Procedure(s): DEBRIDEMENT WOUND OF LEFT FOOT (Left) POSSIBLE METATARSAL HEAD EXCISION FIFTH TOE,POSSIBLE SKINGRAFT (Left) as a surgical intervention.  The patient's history has been reviewed, patient examined, no change in status, stable for surgery.  I have reviewed the patient's chart and labs.  Questions were answered to the patient's satisfaction.     Trula Slade

## 2021-08-24 NOTE — Anesthesia Procedure Notes (Signed)
Procedure Name: MAC Date/Time: 08/24/2021 5:56 PM Performed by: Rande Brunt, CRNA Pre-anesthesia Checklist: Patient identified, Emergency Drugs available, Suction available, Patient being monitored and Timeout performed Patient Re-evaluated:Patient Re-evaluated prior to induction Oxygen Delivery Method: Simple face mask Preoxygenation: Pre-oxygenation with 100% oxygen Induction Type: IV induction Placement Confirmation: positive ETCO2 and CO2 detector Dental Injury: Teeth and Oropharynx as per pre-operative assessment

## 2021-08-24 NOTE — Progress Notes (Signed)
Pt placed on CPAP with full face mask at this time.  No issues to report.

## 2021-08-24 NOTE — Progress Notes (Incomplete)
   08/24/21 1217  Clinical Encounter Type  Visited With Patient  Visit Type Follow-up;Psychological support;Spiritual support;Pre-op  Referral From Nurse Fayne Mediate, RN)  Consult/Referral To Chaplain Benetta Spar)  Recommendations Patient Afraid of Dying and is Requesting Prayer  Spiritual Encounters  Spiritual Needs Prayer;Sacred text (Requested a Bible)  Stress Factors  Patient Stress Factors Family relationships;Financial concerns;Health changes;Lack of caregivers;Loss

## 2021-08-24 NOTE — Progress Notes (Signed)
PT refused CPAP.  

## 2021-08-24 NOTE — Anesthesia Postprocedure Evaluation (Signed)
Anesthesia Post Note  Patient: Anna [name]  Procedure(s) Performed: DEBRIDEMENT WOUND OF LEFT FOOT (Left: Toe) METATARSAL HEAD EXCISION FIFTH TOE (Left: Toe)     Patient location during evaluation: PACU Anesthesia Type: MAC Level of consciousness: awake and alert Pain management: pain level controlled Vital Signs Assessment: post-procedure vital signs reviewed and stable Respiratory status: spontaneous breathing, nonlabored ventilation, respiratory function stable and patient connected to nasal cannula oxygen Cardiovascular status: stable and blood pressure returned to baseline Postop Assessment: no apparent nausea or vomiting Anesthetic complications: no   No notable events documented.  Last Vitals:  Vitals:   08/24/21 1920 08/24/21 1935  BP: (!) 117/97 (!) 117/57  Pulse: 70 63  Resp: 18 (!) 9  Temp:  (!) 36.3 C  SpO2: 98% 96%    Last Pain:  Vitals:   08/24/21 1935  TempSrc:   PainSc: Asleep                 Andrika Peraza

## 2021-08-24 NOTE — Progress Notes (Signed)
PROGRESS NOTE  KISSA HOOLE  DOB: [birth year]  PCP: Kathyrn Lass KZ:[id number]  DOA: 08/19/2021  LOS: 5 days  Hospital Day: 6  Brief narrative: Anna [name] is a 66 y.o. female with PMH significant for obesity, OSA on CPAP, DM2, HTN, HLD, CVA, PAD, GERD, gastric bleeding, peripheral neuropathy, COPD, DVT. Patient has chronic wound and ulcer of left foot due to diabetic polyneuropathy as well as poor circulation and PAD.  She underwent left fifth toe amputation on 06/16/2021 at [facility] with podiatrist. Patient presented to the ED on 5/27 with left great toe ulcer as well as worsening infection at the site of previous left fifth ray amputation. In the ED, she was found to have an adherent eschar at the site of amputation with surrounding erythema and edema.  There is also an eschar in the left great toe. X-ray of left foot did not show any evidence of acute osteomyelitis. Admitted to hospitalist service 5/30, patient underwent left lower extremity angiogram and left anterior tibial artery angioplasty. 5/31, MRI left foot did not show any evidence of osteomyelitis or septic arthritis but showed soft tissue ulceration. See below for details.  Subjective: Patient was seen and examined this morning. Not in distress.  No new symptoms.  Waiting for surgery this afternoon. Wants to resume probiotics.  Blood sugar trend last 24 hours noted.  Over 200 mostly.  Labs from this morning showing improvement in liver enzymes.  Principal Problem:   Cellulitis Active Problems:   Diabetic neuropathy (HCC)   Accelerated hypertension   Type 2 diabetes mellitus (HCC)   COPD mixed type (HCC)   Hyperkalemia   OSA on CPAP    Assessment and Plan: Left foot ulcer Peripheral artery disease -Seen by vascular surgery and podiatry.   -5/30, underwent left anterior tibial artery angioplasty -No radiological evidence of osteomyelitis. -Podiatry following.  Noted a plan for surgery this  afternoon. -Currently on IV vancomycin, cefepime and Flagyl.  Probiotics resumed.  Uncontrolled type 2 diabetes mellitus Diabetic neuropathy -A1c 8.2 on 08/12/2021 -Home meds include metformin, Toujeo 20 units nightly, Trulicity, Jardiance, sliding scale insulin -Currently on Semglee 15 units nightly along with sliding scale insulin.  Blood sugars are consistently elevated over 200 since yesterday afternoon.  Increase Semglee to 18 units for tonight.   -Continue Neurontin for neuropathy Recent Labs  Lab 08/23/21 0547 08/23/21 1148 08/23/21 1619 08/23/21 2109 08/24/21 0616  GLUCAP 166* 153* 221* 237* 152*     Elevated liver enzymes Hepatic steatosis -Significant elevation of liver enzymes noted on 5/31.  Unclear reason.  Gradually improving spontaneously.  Liver ultrasound showed hepatic steatosis.   -Pending hepatitis panel. Recent Labs  Lab 08/19/21 1442 08/20/21 0119 08/23/21 0052 08/24/21 0322  AST 25 19 687* 160*  ALT 14 12 309* 172*  ALKPHOS 51 45 172* 138*  BILITOT 0.6 0.5 0.5 0.6  PROT 6.9 6.5 5.6* 5.6*  ALBUMIN 3.5 3.2* 2.8* 2.7*  PLT 393 369  --  310      Essential hypertension -Blood pressure currently controlled remains controlled without meds.  Lisinopril HCTZ on hold.   -Continue hydralazine PRN   COPD -DuoNeb PRN   Hyperkalemia -Potassium level was elevated 5.2 on admission probably because of potassium supplement she was getting an outpatient.  Currently on hold.  Potassium level normal. Recent Labs  Lab 08/19/21 1442 08/20/21 0119 08/23/21 0052 08/24/21 0322  K 5.2* 4.3 4.1 4.4  MG  --  1.8  --   --  PHOS  --  3.5  --   --      Chronic anemia -Stable hemoglobin.  Continue to monitor Recent Labs    10/01/20 0928 07/30/21 2022 08/19/21 1442 08/20/21 0119 08/22/21 0322 08/24/21 0322  HGB 11.8* 9.9* 11.3* 10.6*  --  9.2*  MCV 89.5 89.9 88.3 86.7  --  89.1  VITAMINB12  --   --   --   --  370  --   FOLATE  --   --   --   --  23.6  --    FERRITIN  --   --   --   --  18  --   TIBC  --   --   --   --  360  --   IRON  --   --   --   --  35  --   RETICCTPCT  --   --   --   --  1.3  --     Hyperlipidemia -Previously on Crestor.  LDL low.  Can stop Crestor.  Discussed with pharmacist   Anxiety -Continue Xanax PRN   GERD -Continue famotidine   Restless leg syndrome -Continue home Requip   OSA on CPAP -Continue CPAP  Morbid obesity  -Body mass index is 29.64 kg/m. Patient has been advised to make an attempt to improve diet and exercise patterns to aid in weight loss.  Goals of care   Code Status: Full Code    Mobility: Need PT evaluation postprocedure  Skin assessment:     Nutritional status:  Body mass index is 29.64 kg/m.  Nutrition Problem: Increased nutrient needs Etiology: wound healing Signs/Symptoms: estimated needs     Diet:  Diet Order             Diet NPO time specified  Diet effective now                   DVT prophylaxis:  SCD's Start: 08/23/21 1905 SCDs Start: 08/19/21 2138   Antimicrobials: IV cefepime, IV Flagyl, IV vancomycin Fluid: None Consultants: Podiatry, vascular surgery Family Communication: None at bedside  Status is: Inpatient  Continue in-hospital care because: Pending surgery today Level of care: Progressive Cardiac   Dispo: The patient is from: Home              Anticipated d/c is to: Pending surgery today              Patient currently is not medically stable to d/c.   Difficult to place patient No     Infusions:   sodium chloride     ceFEPime (MAXIPIME) IV 2 g (08/24/21 0509)   metronidazole 500 mg (08/24/21 0849)   vancomycin 1,000 mg (08/23/21 2248)    Scheduled Meds:  aspirin EC  81 mg Oral Daily   busPIRone  25 mg Oral BID   Chlorhexidine Gluconate Cloth  6 each Topical Once   diclofenac Sodium  2 g Topical QID   docusate sodium  100 mg Oral BID   famotidine  20 mg Oral BID   feeding supplement  237 mL Oral BID BM   FLUoxetine   80 mg Oral Daily   gabapentin  300 mg Oral TID   guaiFENesin  600 mg Oral BID   insulin aspart  0-9 Units Subcutaneous TID WC   insulin glargine-yfgn  18 Units Subcutaneous QHS   multivitamin with minerals  1 tablet Oral Daily   nutrition supplement (JUVEN)  1  packet Oral BID BM   nystatin   Topical TID   pantoprazole  40 mg Oral Daily   polyethylene glycol  17 g Oral Daily   rOPINIRole  0.25 mg Oral QHS   senna  1 tablet Oral BID   sodium chloride flush  3 mL Intravenous Q12H   topiramate  25 mg Oral Daily    PRN meds: sodium chloride, acetaminophen, albuterol, ALPRAZolam, docusate sodium, hydrALAZINE, hydrALAZINE, HYDROcodone-acetaminophen, ipratropium-albuterol, labetalol, loratadine, methocarbamol, ondansetron (ZOFRAN) IV, sodium chloride flush, traZODone   Antimicrobials: Anti-infectives (From admission, onward)    Start     Dose/Rate Route Frequency Ordered Stop   08/20/21 2230  vancomycin (VANCOCIN) IVPB 1000 mg/200 mL premix        1,000 mg 200 mL/hr over 60 Minutes Intravenous Every 24 hours 08/19/21 2146     08/19/21 2230  vancomycin (VANCOREADY) IVPB 1500 mg/300 mL        1,500 mg 150 mL/hr over 120 Minutes Intravenous  Once 08/19/21 2141 08/20/21 0056   08/19/21 2200  ceFEPIme (MAXIPIME) 2 g in sodium chloride 0.9 % 100 mL IVPB        2 g 200 mL/hr over 30 Minutes Intravenous Every 8 hours 08/19/21 1541     08/19/21 1400  metroNIDAZOLE (FLAGYL) IVPB 500 mg        500 mg 100 mL/hr over 60 Minutes Intravenous Every 12 hours 08/19/21 1352 08/26/21 0859   08/19/21 1400  ceFEPIme (MAXIPIME) 2 g in sodium chloride 0.9 % 100 mL IVPB        2 g 200 mL/hr over 30 Minutes Intravenous  Once 08/19/21 1354 08/19/21 1603       Objective: Vitals:   08/24/21 0429 08/24/21 0754  BP: 115/60 (!) 145/62  Pulse: 69 74  Resp: 16 17  Temp: 97.9 F (36.6 C) (!) 97.4 F (36.3 C)  SpO2: 98% 100%    Intake/Output Summary (Last 24 hours) at 08/24/2021 1015 Last data filed at  08/24/2021 [id number] Gross per 24 hour  Intake 660 ml  Output 3300 ml  Net -2640 ml    Filed Weights   08/19/21 2135  Weight: 73.5 kg   Weight change:  Body mass index is 29.64 kg/m.   Physical Exam: General exam: Pleasant, not in distress Skin: No rashes, lesions or ulcers. HEENT: Atraumatic, normocephalic, no obvious bleeding Lungs: Clear to auscultation bilaterally CVS: Regular rate and rhythm, no murmur GI/Abd soft, nontender, nondistended, bowel sound present CNS: Alert, awake, oriented x3 Psychiatry: Mood appropriate. Extremities: No pedal edema, no calf tenderness, left foot with gangrenous changes seen great toe ulcer as well as amputation site of fifth toe  Data Review: I have personally reviewed the laboratory data and studies available.  F/u labs ordered Unresulted Labs (From admission, onward)     Start     Ordered   08/24/21 0500  CBC with Differential/Platelet  Daily,   R     Question:  Specimen collection method  Answer:  Lab=Lab collect   08/23/21 1430   08/24/21 0500  Comprehensive metabolic panel  Daily,   R     Question:  Specimen collection method  Answer:  Lab=Lab collect   08/23/21 1430   08/24/21 0500  Hepatitis panel, acute  Tomorrow morning,   R       Question:  Specimen collection method  Answer:  Lab=Lab collect   08/23/21 1432            Signed, Terrilee Croak, MD [facility] 08/24/2021

## 2021-08-24 NOTE — Progress Notes (Signed)
   08/24/21 1217  Clinical Encounter Type  Visited With Patient  Visit Type Follow-up;Psychological support;Spiritual support;Pre-op  Referral From Nurse Shela Leff, RN)  Consult/Referral To Chaplain Melvenia Beam)  Recommendations Patient Afraid of Dying and is Requesting Prayer  Spiritual Encounters  Spiritual Needs Prayer;Sacred text (Requested a Bible)  Stress Factors  Patient Stress Factors Family relationships;Financial concerns;Health changes;Lack of caregivers;Loss   Spiritual Care Consult List: Request for Prayer 1217: Met Ms. Anna [name] at patient's bedside. [name] is scheduled for surgery this afternoon. She appears very anxious and shares her deep fear of dying. She states that she believes that this fear is in part being brought on by the recent death of her friend Anna [name] that died this past weekend. She also believes that it is in part because of the recent death of another friend, who died of a heart attack a short time after having surgery. She openly discussed the deaths of her daughter in 2017 and her husband in 2016.    In great detail we discussed her fears, isolation, loneliness, along with her financial and housing hardships. Chaplain acknowledged patient's fears, and the pain she is feeling from her recent losses. Discussion and acknowledgement of her fears provided [name] a sense of "empowerment" that helped to her find a sense of peace and comfort. She stated that she really was beginning to "feel relaxed."  [name] also discussed her faith in God and believed that he would see her through her struggles. She asked for Bible that she could read, which chaplain was able to acquire for her after our visit. At [name] request we prayed for peace, reassurance, comfort, healing and a quick recovery.  [address] [place], [place] [phone].

## 2021-08-24 NOTE — Anesthesia Preprocedure Evaluation (Signed)
Anesthesia Evaluation  Patient identified by MRN, date of birth, ID band Patient awake    Reviewed: Allergy & Precautions, NPO status , Patient's Chart, lab work & pertinent test results  Airway Mallampati: II  TM Distance: >3 FB Neck ROM: Full    Dental  (+) Dental Advisory Given   Pulmonary asthma , sleep apnea , COPD,    breath sounds clear to auscultation       Cardiovascular hypertension, Pt. on medications  Rhythm:Regular Rate:Normal     Neuro/Psych  Headaches,    GI/Hepatic negative GI ROS, Neg liver ROS,   Endo/Other  diabetes, Type 2, Insulin Dependent, Oral Hypoglycemic Agents  Renal/GU Renal disease     Musculoskeletal  (+) Arthritis ,   Abdominal   Peds  Hematology  (+) Blood dyscrasia, anemia ,   Anesthesia Other Findings   Reproductive/Obstetrics                             Anesthesia Physical Anesthesia Plan  ASA: 3  Anesthesia Plan: MAC   Post-op Pain Management: Tylenol PO (pre-op)*   Induction:   PONV Risk Score and Plan: 2 and Propofol infusion, Ondansetron and Treatment may vary due to age or medical condition  Airway Management Planned: Natural Airway and Simple Face Mask  Additional Equipment:   Intra-op Plan:   Post-operative Plan:   Informed Consent: I have reviewed the patients History and Physical, chart, labs and discussed the procedure including the risks, benefits and alternatives for the proposed anesthesia with the patient or authorized representative who has indicated his/her understanding and acceptance.       Plan Discussed with: CRNA  Anesthesia Plan Comments:         Anesthesia Quick Evaluation

## 2021-08-24 NOTE — Transfer of Care (Signed)
Immediate Anesthesia Transfer of Care Note  Patient: Anna [name]  Procedure(s) Performed: DEBRIDEMENT WOUND OF LEFT FOOT (Left: Toe) METATARSAL HEAD EXCISION FIFTH TOE (Left: Toe)  Patient Location: PACU  Anesthesia Type:MAC  Level of Consciousness: drowsy  Airway & Oxygen Therapy: Patient Spontanous Breathing and Patient connected to face mask oxygen  Post-op Assessment: Report given to RN and Post -op Vital signs reviewed and stable  Post vital signs: Reviewed and stable  Last Vitals:  Vitals Value Taken Time  BP 111/54 08/24/21 1834  Temp    Pulse 64 08/24/21 1835  Resp 11 08/24/21 1835  SpO2 100 % 08/24/21 1835  Vitals shown include unvalidated device data.  Last Pain:  Vitals:   08/24/21 1620  TempSrc: Oral  PainSc:       Patients Stated Pain Goal: 0 (51/83/43 7357)  Complications: No notable events documented.

## 2021-08-25 ENCOUNTER — Inpatient Hospital Stay (HOSPITAL_COMMUNITY): Payer: No Typology Code available for payment source

## 2021-08-25 ENCOUNTER — Encounter (HOSPITAL_COMMUNITY): Payer: Self-pay | Admitting: Podiatry

## 2021-08-25 DIAGNOSIS — E1152 Type 2 diabetes mellitus with diabetic peripheral angiopathy with gangrene: Secondary | ICD-10-CM | POA: Diagnosis not present

## 2021-08-25 DIAGNOSIS — I351 Nonrheumatic aortic (valve) insufficiency: Secondary | ICD-10-CM

## 2021-08-25 DIAGNOSIS — I361 Nonrheumatic tricuspid (valve) insufficiency: Secondary | ICD-10-CM

## 2021-08-25 DIAGNOSIS — R471 Dysarthria and anarthria: Secondary | ICD-10-CM | POA: Diagnosis not present

## 2021-08-25 DIAGNOSIS — L03116 Cellulitis of left lower limb: Secondary | ICD-10-CM | POA: Diagnosis not present

## 2021-08-25 DIAGNOSIS — G4733 Obstructive sleep apnea (adult) (pediatric): Secondary | ICD-10-CM | POA: Diagnosis not present

## 2021-08-25 DIAGNOSIS — I340 Nonrheumatic mitral (valve) insufficiency: Secondary | ICD-10-CM

## 2021-08-25 LAB — COMPREHENSIVE METABOLIC PANEL
ALT: 189 U/L — ABNORMAL HIGH (ref 0–44)
AST: 272 U/L — ABNORMAL HIGH (ref 15–41)
Albumin: 2.9 g/dL — ABNORMAL LOW (ref 3.5–5.0)
Alkaline Phosphatase: 253 U/L — ABNORMAL HIGH (ref 38–126)
Anion gap: 5 (ref 5–15)
BUN: 16 mg/dL (ref 8–23)
CO2: 27 mmol/L (ref 22–32)
Calcium: 8.8 mg/dL — ABNORMAL LOW (ref 8.9–10.3)
Chloride: 107 mmol/L (ref 98–111)
Creatinine, Ser: 0.85 mg/dL (ref 0.44–1.00)
GFR, Estimated: >60 mL/min (ref >60)
Glucose, Bld: 144 mg/dL — ABNORMAL HIGH (ref 70–99)
Potassium: 4.2 mmol/L (ref 3.5–5.1)
Sodium: 139 mmol/L (ref 135–145)
Total Bilirubin: 0.4 mg/dL (ref 0.3–1.2)
Total Protein: 6.1 g/dL — ABNORMAL LOW (ref 6.5–8.1)

## 2021-08-25 LAB — CBC WITH DIFFERENTIAL/PLATELET
Abs Immature Granulocytes: 0.03 10*3/uL (ref 0.00–0.07)
Basophils Absolute: 0.1 10*3/uL (ref 0.0–0.1)
Basophils Relative: 1 %
Eosinophils Absolute: 0.5 10*3/uL (ref 0.0–0.5)
Eosinophils Relative: 6 %
HCT: 31 % — ABNORMAL LOW (ref 36.0–46.0)
Hemoglobin: 9.4 g/dL — ABNORMAL LOW (ref 12.0–15.0)
Immature Granulocytes: 0 %
Lymphocytes Relative: 42 %
Lymphs Abs: 3.7 10*3/uL (ref 0.7–4.0)
MCH: 27.3 pg (ref 26.0–34.0)
MCHC: 30.3 g/dL (ref 30.0–36.0)
MCV: 90.1 fL (ref 80.0–100.0)
Monocytes Absolute: 0.7 10*3/uL (ref 0.1–1.0)
Monocytes Relative: 8 %
Neutro Abs: 3.8 10*3/uL (ref 1.7–7.7)
Neutrophils Relative %: 43 %
Platelets: 311 10*3/uL (ref 150–400)
RBC: 3.44 MIL/uL — ABNORMAL LOW (ref 3.87–5.11)
RDW: 14.3 % (ref 11.5–15.5)
WBC: 8.8 10*3/uL (ref 4.0–10.5)
nRBC: 0 % (ref 0.0–0.2)

## 2021-08-25 LAB — GLUCOSE, CAPILLARY
Glucose-Capillary: 195 mg/dL — ABNORMAL HIGH (ref 70–99)
Glucose-Capillary: 117 mg/dL — ABNORMAL HIGH (ref 70–99)
Glucose-Capillary: 107 mg/dL — ABNORMAL HIGH (ref 70–99)
Glucose-Capillary: 203 mg/dL — ABNORMAL HIGH (ref 70–99)
Glucose-Capillary: 126 mg/dL — ABNORMAL HIGH (ref 70–99)

## 2021-08-25 LAB — ECHOCARDIOGRAM COMPLETE
Area-P 1/2: 2.56 cm2
Calc EF: 60.1 %
Height: 62 in
MV M vel: 5.75 m/s
MV Peak grad: 132.3 mmHg
MV VTI: 1.54 cm2
P 1/2 time: 433 msec
Radius: 0.3 cm
S' Lateral: 3.8 cm
Single Plane A2C EF: 59.9 %
Single Plane A4C EF: 60.8 %
Weight: 2592.61 oz

## 2021-08-25 IMAGING — CT CT HEAD CODE STROKE
3 series · 14 of 47 positions shown, 16 images · non-contrast
Comparison: [B2]

CLINICAL DATA: Code stroke.  Neuro deficit, acute, stroke suspected



[Series 3: head 5.0 st · axial · 0.49mm/px · z∈[-100,+40]mm · 8 of 34 slices shown, 10 images]
[im 3/34  brain]
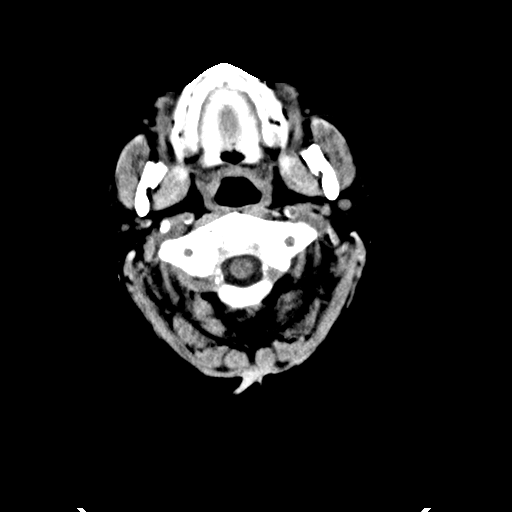
[im 3/34  bone]
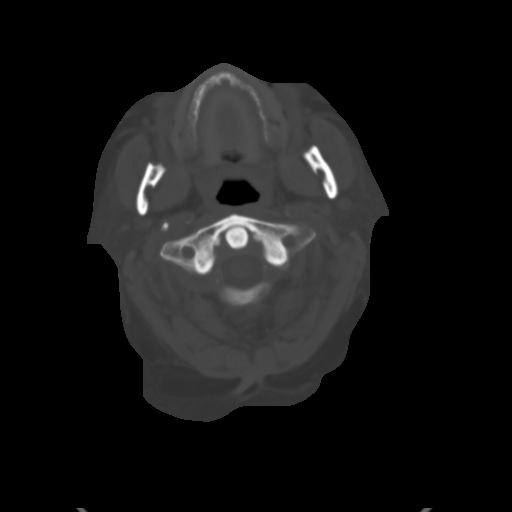
[im 7/34  brain]
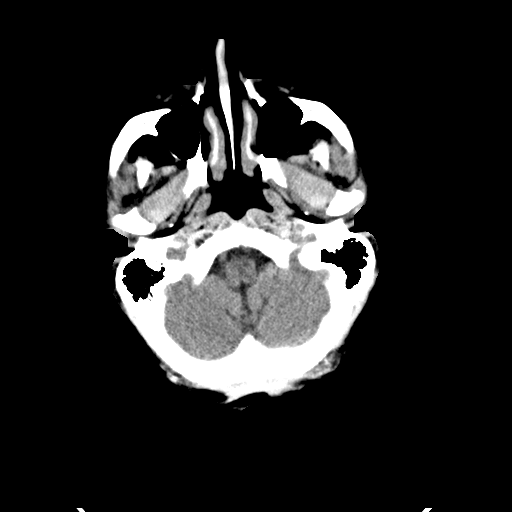
[im 11/34  brain]
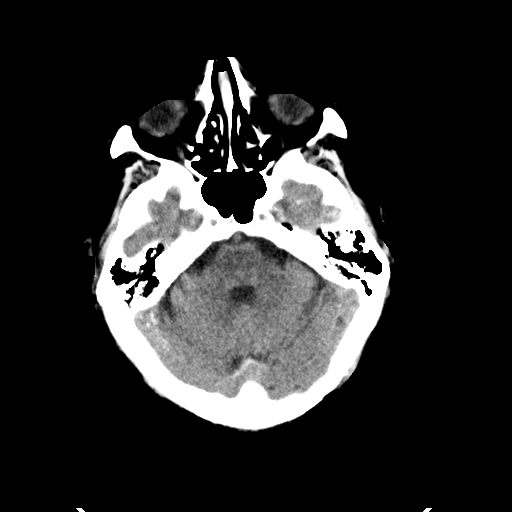
[im 15/34  brain]
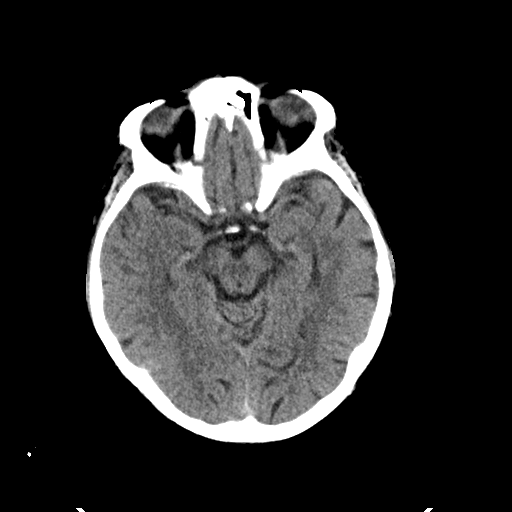
[im 19/34  brain]
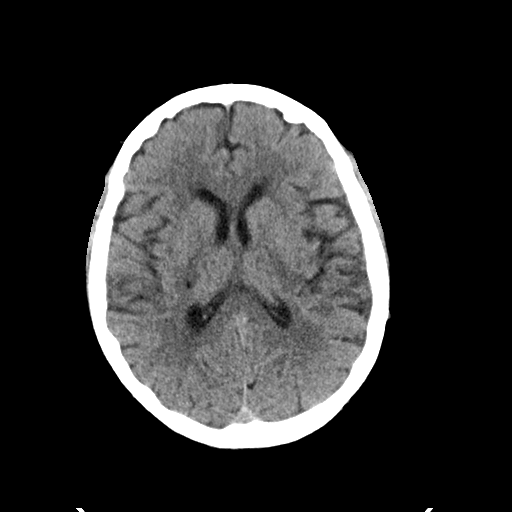
[im 19/34  bone]
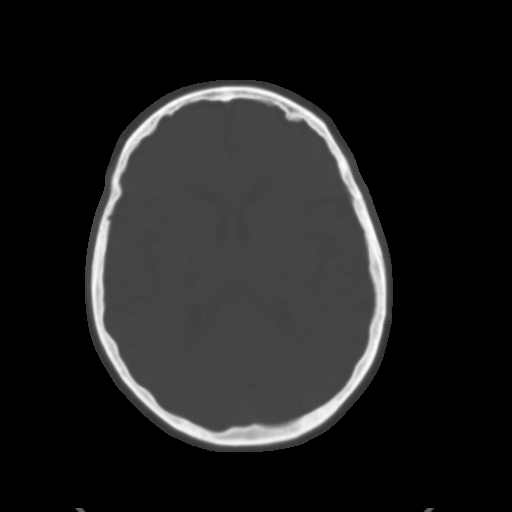
[im 23/34  brain]
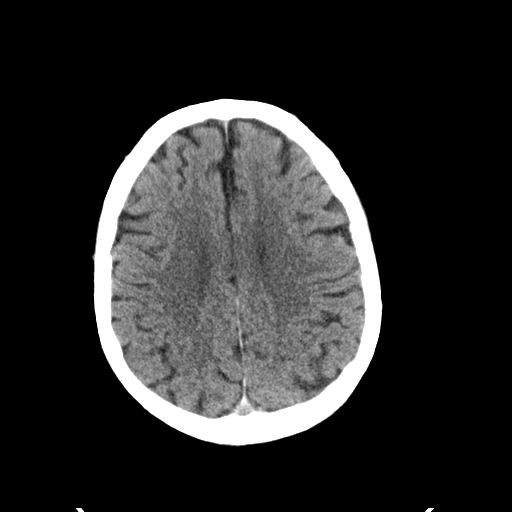
[im 27/34  brain]
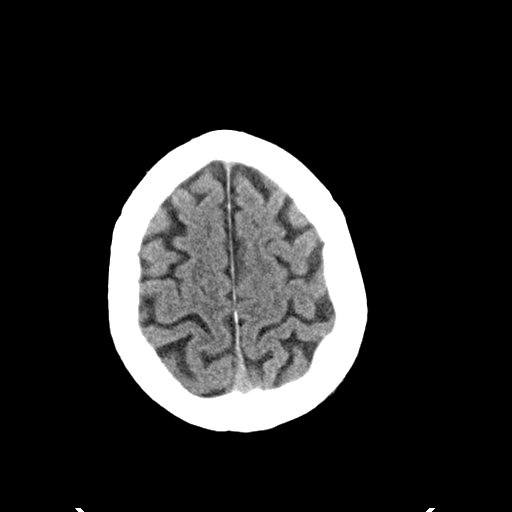
[im 31/34  brain]
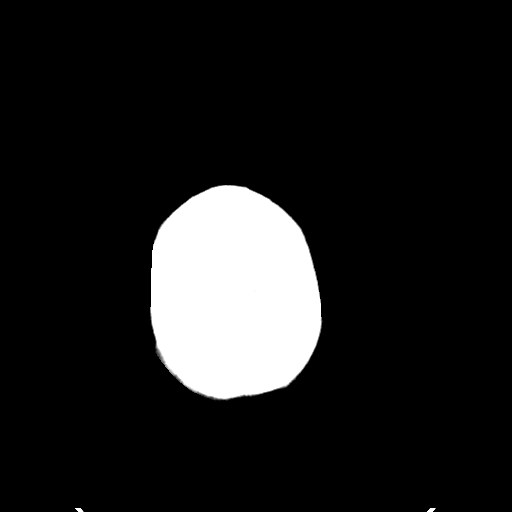

[Series 5: head 3.0 cor st · coronal · 0.39mm/px · 3 of 66 slices shown]
[im 22/66  brain]
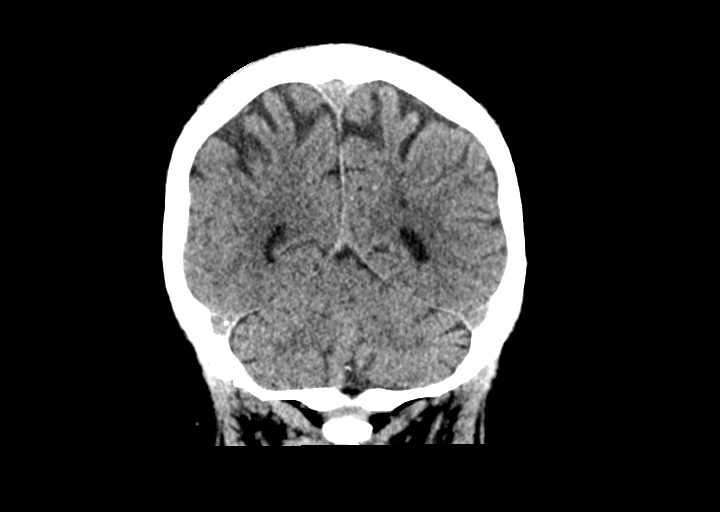
[im 29/66  brain]
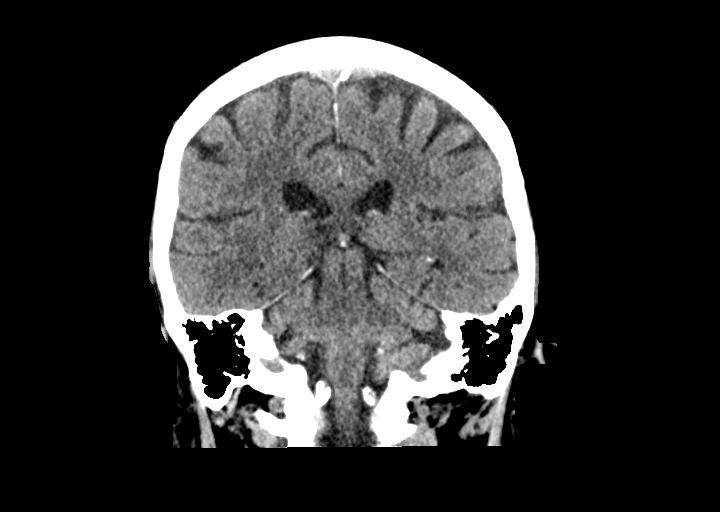
[im 37/66  brain]
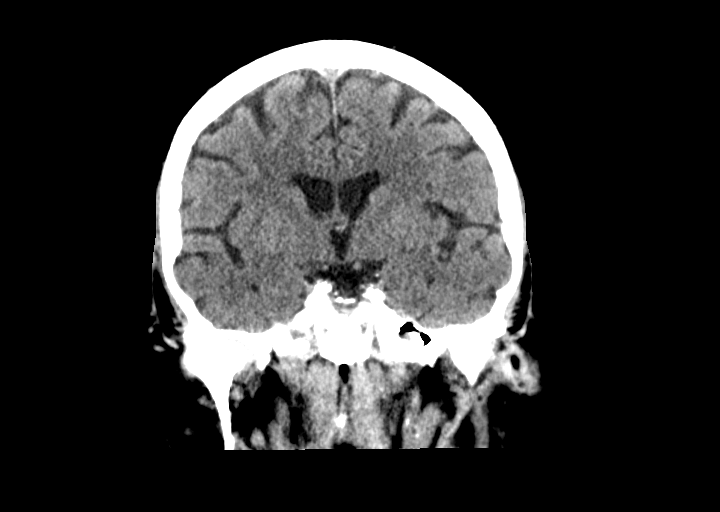

[Series 6: head 3.0 sag st · sagittal · 0.38mm/px · 3 of 61 slices shown]
[im 21/61  brain]
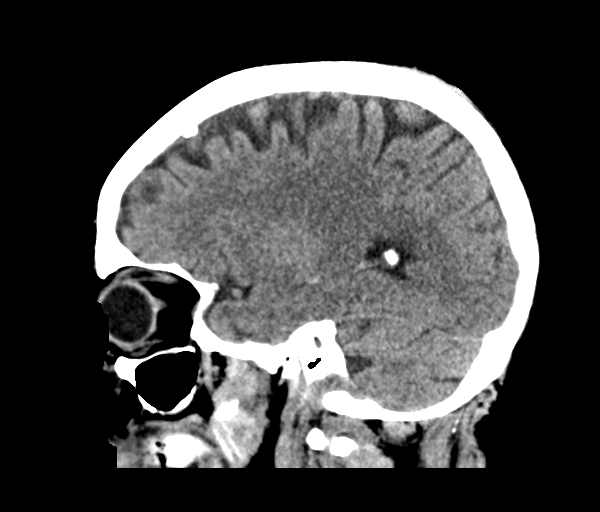
[im 31/61  brain]
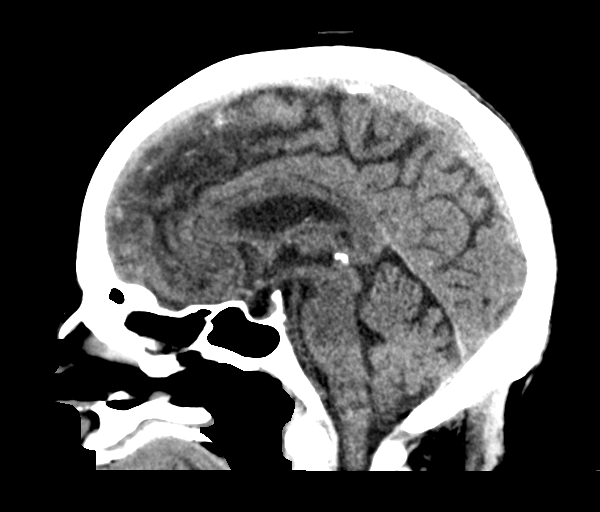
[im 41/61  brain]
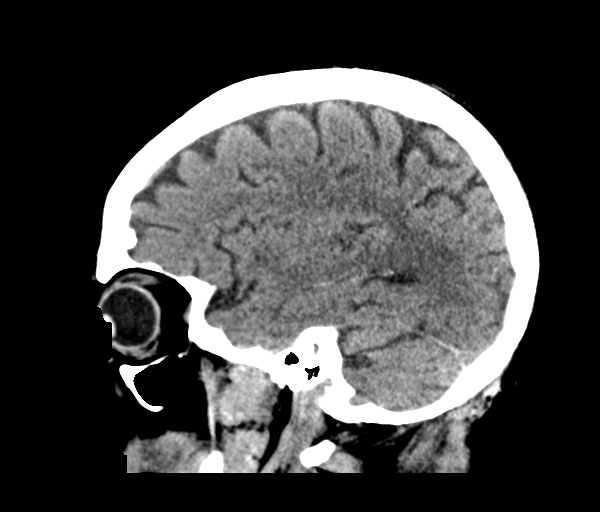

[14 of 47 positions shown; findings below may reference images not displayed]

FINDINGS: Brain: No acute intracranial hemorrhage, mass effect, or edema.
Gray-white differentiation is preserved. Age-indeterminate small
vessel infarcts of the right pons, left thalamus, and right
retrolenticular internal capsule. Additional patchy hypoattenuation
in the supratentorial white matter is nonspecific but may reflect
mild to moderate chronic microvascular ischemic changes. Prominence
of the ventricles and sulci reflects minor parenchymal volume loss.
No extra-axial collection.

Vascular: No hyperdense vessel. There is intracranial
atherosclerotic calcification at the skull base.

Skull: Unremarkable.

Sinuses/Orbits: No acute abnormality.

Other: Mastoid air cells are clear.

ASPECTS (Alberta Stroke Program Early CT Score)

- Ganglionic level infarction (caudate, lentiform nuclei, internal
capsule, insula, M1-M3 cortex): 7

- Supraganglionic infarction (M4-M6 cortex): 3

Total score (0-10 with 10 being normal): 10
IMPRESSION: There is no acute intracranial hemorrhage or evidence of acute
infarction. ASPECT score is 10.

Few age-indeterminate but more likely chronic small vessel infarcts.
Mild to moderate chronic microvascular ischemic changes.

These results were communicated to Dr. KEVIN at [DATE] on [DATE] by
text page via the [employer] messaging system.

## 2021-08-25 MED ORDER — HYDROMORPHONE HCL 1 MG/ML IJ SOLN
0.5000 mg | INTRAMUSCULAR | Status: DC | PRN
  Administered 2021-08-25 – 2021-08-26 (×4): 0.5 mg via INTRAVENOUS
  Filled 2021-08-25 (×4): qty 0.5

## 2021-08-25 MED ORDER — IOHEXOL 350 MG/ML SOLN
100.0000 mL | Freq: Once | INTRAVENOUS | Status: AC | PRN
  Administered 2021-08-25: 100 mL via INTRAVENOUS

## 2021-08-25 NOTE — Progress Notes (Signed)
Subjective: POD # 1 s/p left partial fifth metatarsal amputation with wound closure.  She states has been having pain.  She did get Dilaudid which has been helpful.  When I first came in the room she started crying because of pain but towards the end of the conversation she seemed to be comfortable.  She denies any fevers or chills.  Code stroke was called earlier today.  Objective: AAO x3, NAD Incision coapted with sutures, staples intact.  There is no significant cellulitis but there is some dusky changes present in the plantar aspect that need to monitor closely.  There is no drainage or pus.  There was some bloody drainage on the bandage but there is no purulence.  No pain with calf compression, swelling, warmth, erythema       Assessment: POD #1 s/p left fifth metatarsal partial resection, wound excision, closure  Plan: Dressing was changed today.  Upon changing the bandage it felt better.  Continue pain medication as ordered.  Xeroform was applied followed by dry sterile dressing.  Weightbearing as tolerated although limited to the left foot.  Continue broad-spectrum antibiotics for now.  I will plan on changing the bandage tomorrow as well.  Ovid Curd, DPM

## 2021-08-25 NOTE — Progress Notes (Signed)
Pharmacy Antibiotic Note  Anna [name] is a 66 y.o. female admitted on 08/19/2021 presenting with left foot infection.  Pharmacy has been consulted for vancomycin and cefepime dosing.  Renal function is stable, WBC dow to normal, and afebrile. BCx ngtd. Angiogram planned for today.   Plan: Vancomycin 1000 mg IV q24h (eAUC 401, Scr 0.85) Cefepime 2g IV q8h Monitor renal function, cx and clinical progression to narrow F/U plan for abx  Height: 5\' 2"  (157.5 cm) Weight: 73.5 kg (162 lb 0.6 oz) IBW/kg (Calculated) : 50.1  Temp (24hrs), Avg:97.9 F (36.6 C), Min:97.1 F (36.2 C), Max:99.3 F (37.4 C)  Recent Labs  Lab 08/19/21 1442 08/20/21 0119 08/23/21 0052 08/24/21 0322 08/25/21 0352  WBC 11.6* 9.8  --  9.4 8.8  CREATININE 0.95 0.87 0.81 0.96 0.85  LATICACIDVEN 1.2  --   --   --   --     Estimated Creatinine Clearance: 61.2 mL/min (by C-G formula based on SCr of 0.85 mg/dL).    Allergies  Allergen Reactions   Bacitracin     Other reaction(s): Other Burns skin     Citalopram Diarrhea   Doxycycline Other (See Comments)    Other reaction(s): Other "burning" all over body     Morphine And Related Hives   Neomycin     Other reaction(s): Other (See Comments), Unknown unknown    Ciprofloxacin     Tongue swells   Ciprofloxacin-Dexamethasone Other (See Comments)    unknown    Duloxetine     Other reaction(s): Mental Status Changes (intolerance) "saw pictures of gun"   Latex Itching   Levaquin [Levofloxacin In D5w] Other (See Comments)    Pt does not remember reaction but states she's had issues with med   Piper     Other reaction(s): Other (See Comments) White pepper- uncontrollable sneezing    Salvia Officinalis     Other reaction(s): Other (See Comments) Sneezing   Soma [Carisoprodol]     Sleepy and constipation   Lasix [Furosemide] Rash    Thank you for allowing pharmacy to be a part of this patient's care.  10/25/21, PharmD Clinical  Pharmacist

## 2021-08-25 NOTE — Progress Notes (Signed)
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 S/p debriedment of L foot wound and excision of 5th metatarsal head on 6/1. PMH includes: DM 2, HTN, HLD, peripheral neuropathy, CVA, COPD, CAD, history of DVT in 1980s, history of gastric bleed, GERD, obesity, PAD, and OSA on CPAP.    PT Comments    The pt was agreeable to session, reports continued difficulties with word-finding throughout the day and became very frustrated by this intermittently throughout the session. The pt needed slightly increased assist to power up to standing and complete small pivotal steps to the recliner. She presents with increased pain in LLE and slightly worse tolerance to wt bearing on LLE to allow for stepping at this time. The pt reports no difference in sensation, no focal weakness, and was able to perform coordination testing with BUE with quick and efficient movements. The pt expresses concerns over RUE grip strength, no perceptible difference in strength at this time, but can be given additional exercises at following visits. The pt continues to need physical assist to complete OOB transfers, will benefit from continued skilled PT to progress mobility and independence.    Recommendations for follow up therapy are one component of a multi-disciplinary discharge planning process, led by the attending physician.  Recommendations may be updated based on patient status, additional functional criteria and insurance authorization.  Follow Up Recommendations  Home health PT     Assistance Recommended at Discharge Intermittent Supervision/Assistance  Patient can return home with the following Help with stairs or ramp for entrance;A little help with walking and/or transfers;A little help with  bathing/dressing/bathroom;Assistance with cooking/housework;Assist for transportation   Equipment Recommendations  None recommended by PT (pending progress)    Recommendations for Other Services       Precautions / Restrictions Precautions Precautions: Fall Required Braces or Orthoses: Other Brace Other Brace: Pt describes needing to wear a postop boot, which she left at home Restrictions Weight Bearing Restrictions: Yes LLE Weight Bearing: Partial weight bearing LLE Partial Weight Bearing Percentage or Pounds: through heel only     Mobility  Bed Mobility Overal bed mobility: Needs Assistance Bed Mobility: Supine to Sit     Supine to sit: Min guard, HOB elevated     General bed mobility comments: increased time    Transfers Overall transfer level: Needs assistance Equipment used: Rolling walker (2 wheels) Transfers: Bed to chair/wheelchair/BSC, Sit to/from Stand Sit to Stand: Min assist   Step pivot transfers: Min assist       General transfer comment: from bed to recliner with minA and increased time to power up to standing, minA with significantly increased time and effort to take pivotal steps to the recliner. minA to manage RW as well    Ambulation/Gait Ambulation/Gait assistance: Min guard Gait Distance (Feet): 3 Feet Assistive device: Rolling walker (2 wheels) Gait Pattern/deviations: Step-to pattern, Decreased stride length, Decreased weight shift to left Gait velocity: decreased Gait velocity interpretation: <1.31 ft/sec, indicative of household ambulator   General Gait Details: lateral steps from EOB to recliner. pt limited by pain and fatigue      Balance Overall balance assessment: Needs assistance Sitting-balance support: Bilateral upper extremity supported Sitting balance-Leahy Scale: Good     Standing balance support: During functional activity Standing balance-Leahy Scale: Poor Standing balance  comment: pt utilized BUE support of RW and  stood <3 minutes due to L foot pain; no LOB using RW while standing                            Cognition Arousal/Alertness: Awake/alert Behavior During Therapy: WFL for tasks assessed/performed Overall Cognitive Status: Within Functional Limits for tasks assessed                                 General Comments: internally distracted and with word-finding difficulties inconsistently through session. pt repeating statements such as "I feel nervous that..." and was unable to complete the statement despite increased time and effort. pt able to follow all cues and commands appropriately           General Comments General comments (skin integrity, edema, etc.): VSS on RA      Pertinent Vitals/Pain Pain Assessment Pain Assessment: Faces Faces Pain Scale: Hurts whole lot Pain Location: L foot Pain Descriptors / Indicators: Discomfort, Shooting, Grimacing, Moaning Pain Intervention(s): Premedicated before session, Monitored during session, Limited activity within patient's tolerance, Repositioned     PT Goals (current goals can now be found in the care plan section) Acute Rehab PT Goals Patient Stated Goal: decrease pain PT Goal Formulation: With patient Time For Goal Achievement: 09/03/21 Potential to Achieve Goals: Good Progress towards PT goals: Progressing toward goals    Frequency    Min 3X/week      PT Plan Current plan remains appropriate       AM-PAC PT "6 Clicks" Mobility   Outcome Measure  Help needed turning from your back to your side while in a flat bed without using bedrails?: None Help needed moving from lying on your back to sitting on the side of a flat bed without using bedrails?: A Little Help needed moving to and from a bed to a chair (including a wheelchair)?: A Little Help needed standing up from a chair using your arms (e.g., wheelchair or bedside chair)?: A Little Help needed to walk in hospital room?: Total Help needed  climbing 3-5 steps with a railing? : Total 6 Click Score: 15    End of Session Equipment Utilized During Treatment: Gait belt Activity Tolerance: Patient limited by pain Patient left: with call bell/phone within reach;in chair;with chair alarm set Nurse Communication: Mobility status PT Visit Diagnosis: Unsteadiness on feet (R26.81);Other abnormalities of gait and mobility (R26.89);Pain Pain - Right/Left: Left Pain - part of body: Ankle and joints of foot     Time: 1724-1749 PT Time Calculation (min) (ACUTE ONLY): 25 min  Charges:  $Therapeutic Exercise: 23-37 mins                     Vickki Muff, PT, DPT   Acute Rehabilitation Department Pager #: [phone]   Ronnie Derby 08/25/2021, 6:27 PM

## 2021-08-25 NOTE — TOC Progression Note (Addendum)
Transition of Care [facility]) - Progression Note    Patient Details  Name: Anna [name] MRN: [id number] Date of Birth: [birth year]  Transition of Care [facility] CM/SW Contact  Lockie Pares, RN Phone Number: 08/25/2021, 9:52 AM  Clinical Narrative:     Toe amputation yesterday. Discussed with MD< will place face to face. [facility] called regarding home health needs.PT OT. Left confidential message to return call. Call returned.  Discussed home health PT OT RN, Patient has all DME.   Spoke to the patient about the plan, she will most likely start therapies Monday if DC.  Words coming slower to her per patient, but talking a great deal, sometimes switches subjects to different topics without a sedgway.   Moving left foot frequently back and forth on the bed and off the bed. Still complains of some pain. Eating fruit for dinner and swallowing without coughing,   Expected Discharge Plan: Home w Home Health Services Barriers to Discharge: Continued Medical Work up  Expected Discharge Plan and Services Expected Discharge Plan: Home w Home Health Services   Discharge Planning Services: CM Consult Post Acute Care Choice: Home Health Living arrangements for the past 2 months: Single Family Home                 DME Arranged: N/A DME Agency: NA       HH Arranged: PT, OT, RN, Disease Management HH Agency: Other - See comment ([facility]) Date HH Agency Contacted: 08/21/21   Representative spoke with at [facility] Agency: Valarie Cones, SW   Social Determinants of Health (SDOH) Interventions    Readmission Risk Interventions     View : No data to display.

## 2021-08-25 NOTE — Code Documentation (Addendum)
Stroke Response Nurse Documentation Code Documentation  HERLINDA HEADY is a 66 y.o. female admitted to [facility]  with left foot ulcer s/p vascular surgery yesterday. Past medical hx of obesity, OSA on CPAP, DM2, HTN, HLD, CVA, PAD, GERD, gastric bleeding, peripheral neuropathy, COPD, DVT. On aspirin 81 mg daily. Code stroke was activated by primary MD.   Patient LKW at yesterday, 08/24/2021, prior to surgery at 1640 and now complaining of slurred speech and being clumsy. This morning when she woke up she noted that her speech seemed different. When she went to drink her cup of coffee with her right hand and spilled the entire cup.   Stroke team and primary MD at the bedside. Patient to CT with team. NIHSS 1, see documentation for details and code stroke times. Patient with dysarthria  on exam. The following imaging was completed:  CT Head, CTA, and CTP. Patient is not a candidate for IV Thrombolytic due to mild symptoms. Patient is not a candidate for IR due to LVO negative.   Care Plan: q2h NIHSS and VS. Routine MRI.    Bedside handoff with primary RN Candise Bowens.    Baran Kuhrt L Shahan Starks  Rapid Response RN

## 2021-08-25 NOTE — Progress Notes (Signed)
IV team to bedside for code stroke, pt with adequate access per stroke team.

## 2021-08-25 NOTE — Consult Note (Signed)
Stroke Neurology Consultation Note  Consult Requested by: Dr. Pola Corn  Reason for Consult: slurry speech  Consult Date: 08/25/21   The history was obtained from the pt.  During history and examination, all items were able to obtain unless otherwise noted.  History of Present Illness:  Anna [name] is a 66 y.o. 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  She underwent left fifth toe amputation on 06/16/2021 at [facility] and admitted on 5/27 for left great toe ulcer as well as worsening infection at the site of previous left fifth ray amputation. Had left anterior tibial artery angioplasty on 5/30 and had debridement of left toe yesterday 6/1 with podiatry.   Pt stated that she went to sleep last night early as she was tired from the procedure. However she can not tell me when she fell asleep. She woke up this morning, she found herself clumsy with both her hands and difficulty holding cell phone and type in the cellphone. Then she was asking for coffee and felt her speech was off, more slurry, and not as fluent as before. She also spilled her coffee. She told the symptoms to Dr. Pola Corn who activated code stroke.   LSN: not quite clear, last night before went to sleep tPA Given: No: LSW unclear, nondisabling symptoms   Past Medical History:  Diagnosis Date   Arthritis    Asthma    Back pain    Bronchitis    Bursitis    Diabetes mellitus    Diabetic neuropathy (HCC)    Diabetic retinopathy    Hyperlipemia    Hypertension    Migraine    Obesity    Sepsis due to urinary tract infection [facility])     Past Surgical History:  Procedure Laterality Date   ABDOMINAL AORTOGRAM W/LOWER EXTREMITY Left 08/22/2021   Procedure: ABDOMINAL AORTOGRAM W/LOWER EXTREMITY;  Surgeon: Nada Libman, MD;  Location: [facility] INVASIVE CV LAB;  Service: Cardiovascular;  Laterality: Left;    CESAREAN SECTION     x3   CHOLECYSTECTOMY     EYE SURGERY Left    METATARSAL HEAD EXCISION Left 08/24/2021   Procedure: METATARSAL HEAD EXCISION FIFTH TOE;  Surgeon: Vivi Barrack, DPM;  Location: [facility] OR;  Service: Podiatry;  Laterality: Left;   PERIPHERAL VASCULAR BALLOON ANGIOPLASTY  08/22/2021   Procedure: PERIPHERAL VASCULAR BALLOON ANGIOPLASTY;  Surgeon: Nada Libman, MD;  Location: [facility];  Service: Cardiovascular;;  Left AT   WOUND DEBRIDEMENT Left 08/24/2021   Procedure: DEBRIDEMENT WOUND OF LEFT FOOT;  Surgeon: Vivi Barrack, DPM;  Location: [facility] OR;  Service: Podiatry;  Laterality: Left;    History reviewed. No pertinent family history.  Social History:  reports that she has never smoked. She has never used smokeless tobacco. She reports that she does not drink alcohol and does not use drugs.  Allergies:  Allergies  Allergen Reactions   Bacitracin     Other reaction(s): Other Burns skin     Citalopram Diarrhea   Doxycycline Other (See Comments)    Other reaction(s): Other "burning" all over body     Morphine And Related Hives   Neomycin     Other reaction(s): Other (See Comments), Unknown unknown    Ciprofloxacin     Tongue swells   Ciprofloxacin-Dexamethasone Other (See Comments)    unknown  Duloxetine     Other reaction(s): Mental Status Changes (intolerance) "saw pictures of gun"   Latex Itching   Levaquin [Levofloxacin In D5w] Other (See Comments)    Pt does not remember reaction but states she's had issues with med   Piper     Other reaction(s): Other (See Comments) White pepper- uncontrollable sneezing    Salvia Officinalis     Other reaction(s): Other (See Comments) Sneezing   Soma [Carisoprodol]     Sleepy and constipation   Lasix [Furosemide] Rash    No current facility-administered medications on file prior to encounter.   Current Outpatient Medications on File Prior to Encounter  Medication Sig Dispense Refill    acetaminophen (TYLENOL) 500 MG tablet Take 1,000 mg by mouth every 6 (six) hours as needed for moderate pain.     albuterol (VENTOLIN HFA) 108 (90 Base) MCG/ACT inhaler Inhale 1-2 puffs into the lungs every 6 (six) hours as needed for wheezing or shortness of breath.     ALPRAZolam (XANAX) 1 MG tablet Take 1 mg by mouth 3 (three) times daily as needed for anxiety.     Baclofen 5 MG TABS Take 5 mg by mouth 3 (three) times daily as needed (muscle spasm).     busPIRone (BUSPAR) 10 MG tablet Take 25 mg by mouth 2 (two) times daily.     clopidogrel (PLAVIX) 75 MG tablet Take 75 mg by mouth daily.     diclofenac Sodium (VOLTAREN) 1 % GEL Apply 4 g topically 4 (four) times daily as needed (pain).     docusate sodium (COLACE) 100 MG capsule Take 100 mg by mouth 2 (two) times daily as needed for mild constipation.     Dulaglutide (TRULICITY) 1.5 MG/0.5ML SOPN Inject 3 mg into the skin once a week. Fridays     empagliflozin (JARDIANCE) 10 MG TABS tablet Take 10 mg by mouth daily.     esomeprazole (NEXIUM) 40 MG capsule Take 40 mg by mouth 2 (two) times daily before a meal.     estradiol (ESTRACE) 0.1 MG/GM vaginal cream Place 1 Applicatorful vaginally See admin instructions. Three times a week     famotidine (PEPCID) 20 MG tablet Take 20 mg by mouth 2 (two) times daily.     FLUoxetine (PROZAC) 40 MG capsule Take 80 mg by mouth daily.     fluticasone (FLONASE) 50 MCG/ACT nasal spray Place 1 spray into both nostrils daily as needed for allergies or rhinitis.     gabapentin (NEURONTIN) 300 MG capsule Take 300 mg by mouth 3 (three) times daily.     guaiFENesin (MUCINEX) 600 MG 12 hr tablet Take 600 mg by mouth daily as needed for cough.     hydrocortisone (ANUSOL-HC) 25 MG suppository Place 25 mg rectally 3 (three) times daily as needed for hemorrhoids or anal itching.     hydrOXYzine (ATARAX) 10 MG tablet Take 10 mg by mouth 2 (two) times daily.     ibuprofen (ADVIL) 200 MG tablet Take 600 mg by mouth every 8  (eight) hours as needed for moderate pain.     insulin glargine, 1 Unit Dial, (TOUJEO SOLOSTAR) 300 UNIT/ML Solostar Pen Inject 20 Units into the skin at bedtime.     insulin lispro (HUMALOG) 100 UNIT/ML KwikPen Inject 0-9 Units into the skin 3 (three) times daily. Sliding scale: less than 150 -0 units 150-200 - 2 units 201-250 - 3 units 251-300 - 4 units 301-350 - 5 units 351-400 - 6 units 401-450 -  7 units 451-500 - 8 units 501-550- 9 units Greater than 550 notify provider.     ipratropium-albuterol (DUONEB) 0.5-2.5 (3) MG/3ML SOLN Take 3 mLs by nebulization every 6 (six) hours as needed (wheezing).     Lactobacillus Rhamnosus, GG, (CULTURELLE HEALTH & WELLNESS) CAPS Take 1 capsule by mouth daily.     lidocaine 4 % Place 1 patch onto the skin daily as needed (pain).     lisinopril (ZESTRIL) 5 MG tablet Take 5 mg by mouth daily.     loperamide (IMODIUM) 2 MG capsule Take 2 mg by mouth daily as needed for diarrhea or loose stools.     loratadine (CLARITIN) 10 MG tablet Take 10 mg by mouth daily as needed for allergies.     meclizine (ANTIVERT) 25 MG tablet Take 25 mg by mouth 4 (four) times daily as needed for dizziness.     metFORMIN (GLUCOPHAGE) 1000 MG tablet Take 1,000 mg by mouth 2 (two) times daily with a meal.     Multiple Vitamin (MULTIVITAMIN WITH MINERALS) TABS tablet Take 1 tablet by mouth daily.     nystatin cream (MYCOSTATIN) Apply 1 application. topically 4 (four) times daily as needed for dry skin.     ondansetron (ZOFRAN-ODT) 4 MG disintegrating tablet Take 4 mg by mouth every 8 (eight) hours as needed for nausea or vomiting.     Phenylephrine-Acetaminophen (TYLENOL SINUS+HEADACHE) 5-325 MG TABS Take 2 tablets by mouth daily as needed (headache).     polyethylene glycol (MIRALAX / GLYCOLAX) 17 g packet Take 17 g by mouth daily as needed for moderate constipation.     potassium chloride SA (KLOR-CON M) 20 MEQ tablet Take 20 mEq by mouth daily.     rOPINIRole (REQUIP) 0.25 MG  tablet Take 0.25 mg by mouth at bedtime.     rosuvastatin (CRESTOR) 20 MG tablet Take 20 mg by mouth daily.     senna (SENOKOT) 8.6 MG TABS tablet Take 2 tablets by mouth daily as needed for mild constipation.     sucralfate (CARAFATE) 1 GM/10ML suspension Take 1 g by mouth 4 (four) times daily as needed (stomch ulcer).     topiramate (TOPAMAX) 25 MG tablet Take 25 mg by mouth daily.     traZODone (DESYREL) 50 MG tablet Take 25-50 mg by mouth at bedtime as needed for sleep.     doxycycline (VIBRAMYCIN) 100 MG capsule Take 1 capsule (100 mg total) by mouth 2 (two) times daily. (Patient not taking: Reported on 08/19/2021) 14 capsule 0   hydrochlorothiazide (HYDRODIURIL) 25 MG tablet Take 1 tablet (25 mg total) by mouth daily. (Patient not taking: Reported on 08/19/2021) 30 tablet 0   HYDROcodone-acetaminophen (NORCO/VICODIN) 5-325 MG tablet Take 1 tablet by mouth every 6 (six) hours as needed. (Patient not taking: Reported on 08/19/2021) 5 tablet 0   prochlorperazine (COMPAZINE) 10 MG tablet Take 1 tablet (10 mg total) by mouth 2 (two) times daily as needed for up to 10 doses for nausea or vomiting. (Patient not taking: Reported on 08/19/2021) 10 tablet 0   promethazine (PHENERGAN) 12.5 MG tablet Take 1 tablet (12.5 mg total) by mouth every 6 (six) hours as needed for nausea or vomiting. (Patient not taking: Reported on 08/19/2021) 30 tablet 0    Review of Systems: A full ROS was attempted today and was able to be performed.  Systems assessed include - Constitutional, Eyes, HENT, Respiratory, Cardiovascular, Gastrointestinal, Genitourinary, Integument/breast, Hematologic/lymphatic, Musculoskeletal, Neurological, Behavioral/Psych, Endocrine, Allergic/Immunologic - with pertinent responses as per  HPI.  Physical Examination: Temp:  [97.1 F (36.2 C)-99.3 F (37.4 C)] 99.1 F (37.3 C) (06/02 1039) Pulse Rate:  [60-79] 78 (06/02 1015) Resp:  [9-19] 14 (06/02 1015) BP: (111-168)/(54-97) 127/58 (06/02  1039) SpO2:  [93 %-100 %] 99 % (06/02 1015)  General - well nourished, well developed, in no apparent distress.    Ophthalmologic - fundi not visualized due to noncooperation.    Cardiovascular - regular rhythm and rate  Mental Status -  Level of arousal and orientation to time, place, and person were intact. Language including expression, naming, repetition, comprehension, reading, and writing was assessed and found intact. Intermittent hesitancy of speech but distractable, no clear aphasia or dysarthria.  Fund of Knowledge was assessed and was intact.  Cranial Nerves II - XII - II - Vision intact OU. III, IV, VI - Extraocular movements intact. V - Facial sensation intact bilaterally. VII - Facial movement intact bilaterally. VIII - Hearing & vestibular intact bilaterally. X - Palate elevates symmetrically. XI - Chin turning & shoulder shrug intact bilaterally. XII - Tongue protrusion intact.  Motor Strength - The patient's strength was normal in all extremities and pronator drift was absent. Left foot in dressing with left toe amputation  Motor Tone & Bulk - Muscle tone was assessed at the neck and appendages and was normal.  Bulk was normal and fasciculations were absent.   Reflexes - The patient's reflexes were normal in all extremities and she had no pathological reflexes.  Sensory - Light touch, temperature/pinprick were assessed and were normal.    Coordination - The patient had normal movements in the hands and feet with no ataxia or dysmetria.  Tremor was absent.  Gait and Station - deferred  NIHSS = 1 with mild dysarthria  Data Reviewed:  CT HEAD CODE STROKE WO CONTRAST  Result Date: 08/25/2021 CLINICAL DATA:  Code stroke.  Neuro deficit, acute, stroke suspected EXAM: CT HEAD WITHOUT CONTRAST TECHNIQUE: Contiguous axial images were obtained from the base of the skull through the vertex without intravenous contrast. RADIATION DOSE REDUCTION: This exam was performed  according to the departmental dose-optimization program which includes automated exposure control, adjustment of the mA and/or kV according to patient size and/or use of iterative reconstruction technique. COMPARISON:  2014 FINDINGS: Brain: No acute intracranial hemorrhage, mass effect, or edema. Gray-white differentiation is preserved. Age-indeterminate small vessel infarcts of the right pons, left thalamus, and right retrolenticular internal capsule. Additional patchy hypoattenuation in the supratentorial white matter is nonspecific but may reflect mild to moderate chronic microvascular ischemic changes. Prominence of the ventricles and sulci reflects minor parenchymal volume loss. No extra-axial collection. Vascular: No hyperdense vessel. There is intracranial atherosclerotic calcification at the skull base. Skull: Unremarkable. Sinuses/Orbits: No acute abnormality. Other: Mastoid air cells are clear. ASPECTS ([place] Stroke Program Early CT Score) - Ganglionic level infarction (caudate, lentiform nuclei, internal capsule, insula, M1-M3 cortex): 7 - Supraganglionic infarction (M4-M6 cortex): 3 Total score (0-10 with 10 being normal): 10 IMPRESSION: There is no acute intracranial hemorrhage or evidence of acute infarction. ASPECT score is 10. Few age-indeterminate but more likely chronic small vessel infarcts. Mild to moderate chronic microvascular ischemic changes. These results were communicated to Dr. Roda Shutters at 10:29 am on 08/25/2021 by text page via the [facility] messaging system. Electronically Signed   By: Guadlupe Spanish M.D.   On: 08/25/2021 10:31   CT ANGIO HEAD NECK W WO CM W PERF  Result Date: 08/25/2021 CLINICAL DATA:  Neuro deficit, acute, stroke suspected  EXAM: CT ANGIOGRAPHY HEAD AND NECK CT PERFUSION BRAIN TECHNIQUE: Multidetector CT imaging of the head and neck was performed using the standard protocol during bolus administration of intravenous contrast. Multiplanar CT image reconstructions and MIPs were  obtained to evaluate the vascular anatomy. Carotid stenosis measurements (when applicable) are obtained utilizing NASCET criteria, using the distal internal carotid diameter as the denominator. Multiphase CT imaging of the brain was performed following IV bolus contrast injection. Subsequent parametric perfusion maps were calculated using RAPID software. RADIATION DOSE REDUCTION: This exam was performed according to the departmental dose-optimization program which includes automated exposure control, adjustment of the mA and/or kV according to patient size and/or use of iterative reconstruction technique. CONTRAST:  OMNIPAQUE IOHEXOL 350 MG/ML SOLN COMPARISON:  None Available. FINDINGS: CTA NECK Aortic arch: Mild calcified plaque. Great vessel origins are patent. Right carotid system: Patent. Mild mixed plaque along the common carotid without stenosis. Calcified plaque along the proximal internal carotid with minimal stenosis. Left carotid system: Patent. Calcified plaque along the common and proximal internal carotids with minimal stenosis. Vertebral arteries: Patent. Right vertebral is mildly dominant. The left vertebral arises directly from the arch. Plaque at the origin causes marked stenosis without apparent flow limitation. Skeleton: Cervical spine degenerative changes particularly at C5-C6 and C6-C7. Disc osteophyte complex at C5-C6 causes marked canal stenosis. Other neck: Unremarkable. Upper chest: No apical lung mass. Review of the MIP images confirms the above findings CTA HEAD Anterior circulation: Intracranial internal carotid arteries are patent with calcified plaque but no significant stenosis. Anterior and middle cerebral arteries are patent. Posterior circulation: Intracranial vertebral arteries are patent. There is calcified plaque on the right causing mild stenosis. Mixed plaque on the left causes mild to moderate stenosis. Basilar artery is patent. Posterior cerebral arteries are patent.  There is marked stenosis of the proximal left P1 PCA and marked stenosis of the proximal left P2 PCA. Venous sinuses: Patent as allowed by contrast bolus timing. Review of the MIP images confirms the above findings CT Brain Perfusion Findings: CBF (<30%) Volume: 0mL Perfusion (Tmax>6.0s) volume: 0mL Mismatch Volume: 0mL Infarction Location: None. IMPRESSION: No large vessel occlusion. No hemodynamically significant stenosis in the neck. Marked stenoses of the proximal left P1 and P2 PCA. Perfusion imaging demonstrates no evidence of core infarction or penumbra. Marked canal stenosis at C5-C6. Electronically Signed   By: Guadlupe Spanish M.D.   On: 08/25/2021 10:49    Assessment: 66 y.o. 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 She Had left anterior tibial artery angioplasty on 5/30 and had debridement of left toe yesterday 6/1 with podiatry.   Complained of clumsy with both her hands and slurry speech after woke up this morning. Exam showed intermittent hesitancy of speech but distractable, concerning for functional component. LSW unclear, NIHSS = 1, pt not tPA candidate for LSW unclear and nondiabling symptoms. CT no acute finding, CTA head and neck showed left P1 stenosis but CTP neg. However, given multiple uncontrolled stroke risk factors, will need to finish off the stroke work up.   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  Recommend to resume home  crestor Stroke risk factor modification Discussed with Dr. Pola Corn primary team Will follow   Thank you for this consultation and allowing [place] to participate in the care of this patient.  Marvel Plan, MD PhD Stroke Neurology 08/25/2021 11:29 AM

## 2021-08-25 NOTE — Progress Notes (Signed)
  Progress Note    08/25/2021 3:58 PM 1 Day Post-Op  Subjective: Complaining of pain in left foot  Vitals:   08/25/21 1105 08/25/21 1400  BP: (!) 127/58 (!) 111/52  Pulse: 78 76  Resp: 14 17  Temp: 99.1 F (37.3 C)   SpO2: 99% 95%    Physical Exam: Awake alert and oriented Nonlabored respirations Very strong left what appears to be peroneal signal at the foot with warm toes Dressing on the foot is clean dry intact  CBC    Component Value Date/Time   WBC 8.8 08/25/2021 0352   RBC 3.44 (L) 08/25/2021 0352   HGB 9.4 (L) 08/25/2021 0352   HCT 31.0 (L) 08/25/2021 0352   PLT 311 08/25/2021 0352   MCV 90.1 08/25/2021 0352   MCH 27.3 08/25/2021 0352   MCHC 30.3 08/25/2021 0352   RDW 14.3 08/25/2021 0352   LYMPHSABS 3.7 08/25/2021 0352   MONOABS 0.7 08/25/2021 0352   EOSABS 0.5 08/25/2021 0352   BASOSABS 0.1 08/25/2021 0352    BMET    Component Value Date/Time   NA 139 08/25/2021 0352   K 4.2 08/25/2021 0352   CL 107 08/25/2021 0352   CO2 27 08/25/2021 0352   GLUCOSE 144 (H) 08/25/2021 0352   BUN 16 08/25/2021 0352   CREATININE 0.85 08/25/2021 0352   CALCIUM 8.8 (L) 08/25/2021 0352   GFRNONAA >60 08/25/2021 0352   GFRAA >90 06/25/2014 0535    INR No results found for: INR   Intake/Output Summary (Last 24 hours) at 08/25/2021 1558 Last data filed at 08/25/2021 1200 Gross per 24 hour  Intake 1390 ml  Output 2950 ml  Net -1560 ml     Assessment/plan:  66 y.o. female is s/p left lower extremity revascularization with strong signal at the foot that does appear to be peroneal not anterior tibial but may be a branch vessel, either way the foot is very warm.  Treatment of the foot wounds per podiatry and will follow-up with [facility] office a few weeks postdischarge.   Yasmen Cortner C. Randie Heinz, MD [facility] Office: [phone] Pager: [phone]  08/25/2021 3:58 PM

## 2021-08-25 NOTE — Progress Notes (Signed)
PT Cancellation Note  Patient Details Name: Anna [name] MRN: [id number] DOB: [birth year]   Cancelled Treatment:    Reason Eval/Treat Not Completed: Medical issues which prohibited therapy. Pt with new onset stroke sx (slurred speech and impaired coordination) since procedure, neuro has been consulted and is awaiting MRI. Will continue to follow and re-evaluate as medically appropriate.   Vickki Muff, PT, DPT   Acute Rehabilitation Department Pager #: [phone]   Ronnie Derby 08/25/2021, 12:49 PM

## 2021-08-25 NOTE — Progress Notes (Signed)
PROGRESS NOTE  Anna [name]  DOB: [birth year]  PCP: Aviva Kluver ZOX:[id number]  DOA: 08/19/2021  LOS: 6 days  Hospital Day: 7  Brief narrative: Anna [name] is a 66 y.o. female with PMH significant for obesity, OSA on CPAP, DM2, HTN, HLD, CVA, PAD, GERD, gastric bleeding, peripheral neuropathy, COPD, DVT. Patient has chronic wound and ulcer of left foot due to diabetic polyneuropathy as well as poor circulation and PAD.  She underwent left fifth toe amputation on 06/16/2021 at [facility] with podiatrist. Patient presented to the ED on 5/27 with left great toe ulcer as well as worsening infection at the site of previous left fifth ray amputation. In the ED, she was found to have an adherent eschar at the site of amputation with surrounding erythema and edema.  There is also an eschar in the left great toe. X-ray of left foot did not show any evidence of acute osteomyelitis. Admitted to hospitalist service 5/30, patient underwent left lower extremity angiogram and left anterior tibial artery angioplasty. 5/31, MRI left foot did not show any evidence of osteomyelitis or septic arthritis but showed soft tissue ulceration. See below for details. 6/1, patient underwent debridement of the great toe wound and left fifth toe metatarsal head excision. 6/2, patient complained of dysarthria.  Code stroke was called.  CT head and CT angio head and neck negative.  Subjective: Patient was seen and examined this morning. RN was in the room.  Patient was complaining of clumsiness in both of her hands since this morning.  She was also having intermittent problems articulating her words.  She was anxious about the possibility of a stroke.  Code stroke was called.  Patient was seen by neurologist Dr. Roda Shutters.  CT head and CT angio of head and neck were done which were negative.  Other stroke work-up in process.  Principal Problem:   Cellulitis Active Problems:   Diabetic neuropathy (HCC)    Accelerated hypertension   Type 2 diabetes mellitus (HCC)   COPD mixed type (HCC)   Hyperkalemia   OSA on CPAP    Assessment and Plan: Left foot ulcer Peripheral artery disease -Seen by vascular surgery and podiatry.  No radiological evidence of osteomyelitis -5/30, underwent left anterior tibial artery angioplasty -No radiological evidence of osteomyelitis. -6/1, patient underwent debridement of the great toe wound and left fifth toe metatarsal head excision. -Currently on IV vancomycin, cefepime and Flagyl.  Probiotics resumed.  Dysarthria TIA -This morning, patient woke up with clumsiness in both of her hands.  She was also having intermittent problems articulating her words.  She was anxious about the possibility of a stroke.  Code stroke was called.  Patient was seen by neurologist Dr. Roda Shutters.  CT head and CT angio of head and neck were done which were negative.  Other stroke work-up in process. -MRI brain, TTE ordered.  Uncontrolled type 2 diabetes mellitus Diabetic neuropathy -A1c 8.2 on 08/12/2021 -Home meds include metformin, Toujeo 20 units nightly, Trulicity, Jardiance, sliding scale insulin -Currently on Semglee 18 units nightly along with sliding scale insulin with Accu-Cheks. -Continue Neurontin for neuropathy Recent Labs  Lab 08/24/21 1835 08/24/21 2227 08/25/21 0618 08/25/21 1011 08/25/21 1110  GLUCAP 115* 158* 195* 117* 107*     Elevated liver enzymes Hepatic steatosis -Significant elevation of liver enzymes noted on 5/31.  Unclear reason. Liver ultrasound showed hepatic steatosis. Fluctuating course but overall improving.  Continue to monitor.    -Nonreactive hepatitis panel. Recent Labs  Lab  08/19/21 1442 08/20/21 0119 08/23/21 0052 08/24/21 0322 08/25/21 0352  AST 25 19 687* 160* 272*  ALT 14 12 309* 172* 189*  ALKPHOS 51 45 172* 138* 253*  BILITOT 0.6 0.5 0.5 0.6 0.4  PROT 6.9 6.5 5.6* 5.6* 6.1*  ALBUMIN 3.5 3.2* 2.8* 2.7* 2.9*  PLT 393 369  --   310 311    Essential hypertension -Blood pressure currently controlled remains controlled without meds.  Lisinopril/HCTZ on hold.   -Continue hydralazine PRN   COPD -DuoNeb PRN    Chronic anemia -Stable hemoglobin.  Continue to monitor Recent Labs    07/30/21 2022 08/19/21 1442 08/20/21 0119 08/22/21 0322 08/24/21 0322 08/25/21 0352  HGB 9.9* 11.3* 10.6*  --  9.2* 9.4*  MCV 89.9 88.3 86.7  --  89.1 90.1  VITAMINB12  --   --   --  370  --   --   FOLATE  --   --   --  23.6  --   --   FERRITIN  --   --   --  18  --   --   TIBC  --   --   --  360  --   --   IRON  --   --   --  35  --   --   RETICCTPCT  --   --   --  1.3  --   --     Hyperlipidemia -Previously on Crestor. LDL low. Crestor stopped. Discussed with pharmacist   Anxiety -Continue Xanax PRN   GERD -Continue famotidine   Restless leg syndrome -Continue home Requip   OSA on CPAP -Continue CPAP  Morbid obesity  -Body mass index is 29.64 kg/m. Patient has been advised to make an attempt to improve diet and exercise patterns to aid in weight loss.  Goals of care   Code Status: Full Code    Mobility: Need PT evaluation postprocedure  Skin assessment:     Nutritional status:  Body mass index is 29.64 kg/m.  Nutrition Problem: Increased nutrient needs Etiology: wound healing Signs/Symptoms: estimated needs     Diet:  Diet Order             Diet Carb Modified Fluid consistency: Thin; Room service appropriate? Yes  Diet effective now                   DVT prophylaxis:  SCDs Start: 08/19/21 2138   Antimicrobials: IV cefepime, IV Flagyl, IV vancomycin Fluid: None Consultants: Podiatry, vascular surgery Family Communication: None at bedside  Status is: Inpatient  Continue in-hospital care because: TIA work-up in process Level of care: Progressive Cardiac   Dispo: The patient is from: Home              Anticipated d/c is to: Pending surgery today              Patient currently  is not medically stable to d/c.   Difficult to place patient No     Infusions:   sodium chloride     ceFEPime (MAXIPIME) IV 2 g (08/25/21 0519)   metronidazole 500 mg (08/25/21 1119)   vancomycin 1,000 mg (08/25/21 0004)    Scheduled Meds:  acidophilus  2 capsule Oral Daily   aspirin EC  81 mg Oral Daily   busPIRone  25 mg Oral BID   diclofenac Sodium  2 g Topical QID   docusate sodium  100 mg Oral BID   famotidine  20  mg Oral BID   feeding supplement  237 mL Oral BID BM   FLUoxetine  80 mg Oral Daily   gabapentin  300 mg Oral TID   guaiFENesin  600 mg Oral BID   insulin aspart  0-9 Units Subcutaneous TID WC   insulin glargine-yfgn  18 Units Subcutaneous QHS   multivitamin with minerals  1 tablet Oral Daily   nutrition supplement (JUVEN)  1 packet Oral BID BM   nystatin   Topical TID   pantoprazole  40 mg Oral Daily   polyethylene glycol  17 g Oral Daily   rOPINIRole  0.25 mg Oral QHS   senna  1 tablet Oral BID   sodium chloride flush  3 mL Intravenous Q12H   topiramate  25 mg Oral Daily    PRN meds: sodium chloride, acetaminophen, albuterol, ALPRAZolam, docusate sodium, hydrALAZINE, hydrALAZINE, HYDROcodone-acetaminophen, ipratropium-albuterol, labetalol, loratadine, methocarbamol, ondansetron (ZOFRAN) IV, sodium chloride flush, traZODone   Antimicrobials: Anti-infectives (From admission, onward)    Start     Dose/Rate Route Frequency Ordered Stop   08/20/21 2230  vancomycin (VANCOCIN) IVPB 1000 mg/200 mL premix        1,000 mg 200 mL/hr over 60 Minutes Intravenous Every 24 hours 08/19/21 2146     08/19/21 2230  vancomycin (VANCOREADY) IVPB 1500 mg/300 mL        1,500 mg 150 mL/hr over 120 Minutes Intravenous  Once 08/19/21 2141 08/20/21 0056   08/19/21 2200  ceFEPIme (MAXIPIME) 2 g in sodium chloride 0.9 % 100 mL IVPB        2 g 200 mL/hr over 30 Minutes Intravenous Every 8 hours 08/19/21 1541     08/19/21 1400  metroNIDAZOLE (FLAGYL) IVPB 500 mg        500  mg 100 mL/hr over 60 Minutes Intravenous Every 12 hours 08/19/21 1352 08/26/21 0859   08/19/21 1400  ceFEPIme (MAXIPIME) 2 g in sodium chloride 0.9 % 100 mL IVPB        2 g 200 mL/hr over 30 Minutes Intravenous  Once 08/19/21 1354 08/19/21 1603       Objective: Vitals:   08/25/21 1015 08/25/21 1039  BP: (!) 168/73 (!) 127/58  Pulse: 78   Resp: 14   Temp:  99.1 F (37.3 C)  SpO2: 99%     Intake/Output Summary (Last 24 hours) at 08/25/2021 1142 Last data filed at 08/25/2021 0820 Gross per 24 hour  Intake 1390 ml  Output 2400 ml  Net -1010 ml    Filed Weights   08/19/21 2135  Weight: 73.5 kg   Weight change:  Body mass index is 29.64 kg/m.   Physical Exam: General exam: Pleasant, not in distress Skin: No rashes, lesions or ulcers. HEENT: Atraumatic, normocephalic, no obvious bleeding Lungs: Clear to auscultation bilaterally CVS: Regular rate and rhythm, no murmur GI/Abd soft, nontender, nondistended, bowel sound present CNS: Alert, awake, oriented x3.  Intermittent dysarthria noted. Psychiatry: Mood appropriate. Extremities: No pedal edema, no calf tenderness.  Left foot has a bandage on. Data Review: I have personally reviewed the laboratory data and studies available.  F/u labs ordered Unresulted Labs (From admission, onward)     Start     Ordered   08/24/21 0500  CBC with Differential/Platelet  Daily,   R     Question:  Specimen collection method  Answer:  Lab=Lab collect   08/23/21 1430   08/24/21 0500  Comprehensive metabolic panel  Daily,   R     Question:  Specimen collection method  Answer:  Lab=Lab collect   08/23/21 1430            Signed, Lorin Glass, MD [facility] 08/25/2021

## 2021-08-25 NOTE — Progress Notes (Signed)
  Echocardiogram 2D Echocardiogram has been performed.  Anna [name] 08/25/2021, 2:13 PM

## 2021-08-26 ENCOUNTER — Inpatient Hospital Stay (HOSPITAL_COMMUNITY): Payer: No Typology Code available for payment source

## 2021-08-26 LAB — CBC WITH DIFFERENTIAL/PLATELET
Abs Immature Granulocytes: 0.04 10*3/uL (ref 0.00–0.07)
Basophils Absolute: 0.1 10*3/uL (ref 0.0–0.1)
Basophils Relative: 1 %
Eosinophils Absolute: 0.4 10*3/uL (ref 0.0–0.5)
Eosinophils Relative: 4 %
HCT: 29.4 % — ABNORMAL LOW (ref 36.0–46.0)
Hemoglobin: 9.1 g/dL — ABNORMAL LOW (ref 12.0–15.0)
Immature Granulocytes: 0 %
Lymphocytes Relative: 32 %
Lymphs Abs: 3.4 10*3/uL (ref 0.7–4.0)
MCH: 27.6 pg (ref 26.0–34.0)
MCHC: 31 g/dL (ref 30.0–36.0)
MCV: 89.1 fL (ref 80.0–100.0)
Monocytes Absolute: 1 10*3/uL (ref 0.1–1.0)
Monocytes Relative: 9 %
Neutro Abs: 5.6 10*3/uL (ref 1.7–7.7)
Neutrophils Relative %: 54 %
Platelets: 314 10*3/uL (ref 150–400)
RBC: 3.3 MIL/uL — ABNORMAL LOW (ref 3.87–5.11)
RDW: 14.4 % (ref 11.5–15.5)
WBC: 10.5 10*3/uL (ref 4.0–10.5)
nRBC: 0 % (ref 0.0–0.2)

## 2021-08-26 LAB — COMPREHENSIVE METABOLIC PANEL
ALT: 164 U/L — ABNORMAL HIGH (ref 0–44)
AST: 241 U/L — ABNORMAL HIGH (ref 15–41)
Albumin: 2.8 g/dL — ABNORMAL LOW (ref 3.5–5.0)
Alkaline Phosphatase: 311 U/L — ABNORMAL HIGH (ref 38–126)
Anion gap: 8 (ref 5–15)
BUN: 19 mg/dL (ref 8–23)
CO2: 25 mmol/L (ref 22–32)
Calcium: 8.8 mg/dL — ABNORMAL LOW (ref 8.9–10.3)
Chloride: 103 mmol/L (ref 98–111)
Creatinine, Ser: 0.82 mg/dL (ref 0.44–1.00)
GFR, Estimated: >60 mL/min (ref >60)
Glucose, Bld: 126 mg/dL — ABNORMAL HIGH (ref 70–99)
Potassium: 4.4 mmol/L (ref 3.5–5.1)
Sodium: 136 mmol/L (ref 135–145)
Total Bilirubin: 0.8 mg/dL (ref 0.3–1.2)
Total Protein: 5.9 g/dL — ABNORMAL LOW (ref 6.5–8.1)

## 2021-08-26 LAB — GLUCOSE, CAPILLARY
Glucose-Capillary: 115 mg/dL — ABNORMAL HIGH (ref 70–99)
Glucose-Capillary: 117 mg/dL — ABNORMAL HIGH (ref 70–99)
Glucose-Capillary: 205 mg/dL — ABNORMAL HIGH (ref 70–99)
Glucose-Capillary: 201 mg/dL — ABNORMAL HIGH (ref 70–99)

## 2021-08-26 IMAGING — MR MR HEAD W/O CM
6 of 10 series · 29 of 48 positions shown · non-contrast
Comparison: CT head and CTA head and neck [DATE].

Brain MRI [DATE] JUMPER [REDACTED]

CLINICAL DATA: 65-year-old male with garbled speech. Weakness. Code
stroke presentation yesterday.

EXAM:
MRI HEAD WITHOUT CONTRAST
TECHNIQUE: Multiplanar, multiecho pulse sequences of the brain and surrounding
structures were obtained without intravenous contrast.

[Series 2: DWI · axial · 3.0mm · 0.94mm/px · z∈[-71,+73]mm · 9 of 98 slices shown (1 of 2)]
[im 1/98]
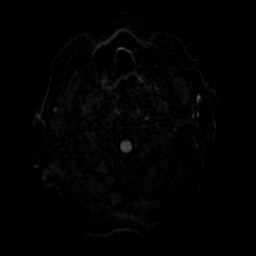
[im 13/98]
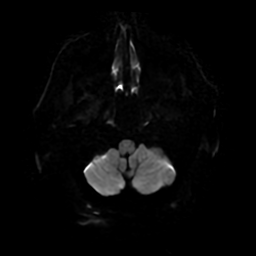
[im 25/98]
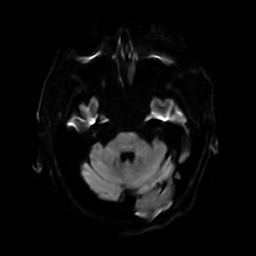
[im 37/98]
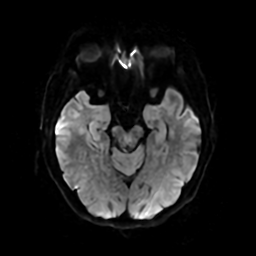
[im 49/98]
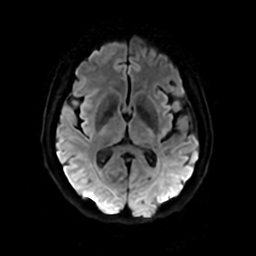
[im 61/98]
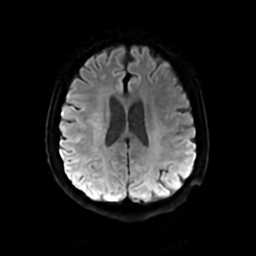
[im 73/98]
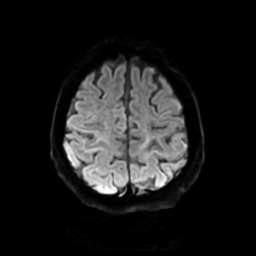
[im 85/98]
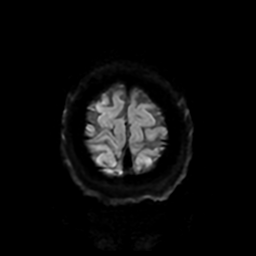
[im 98/98]
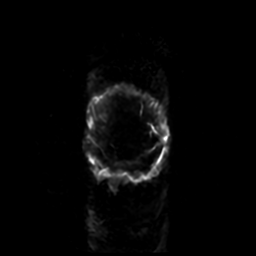

[Series 3: DWI · coronal · 4.0mm · 0.94mm/px · 7 of 72 slices shown (2 of 2)]
[im 1/72]
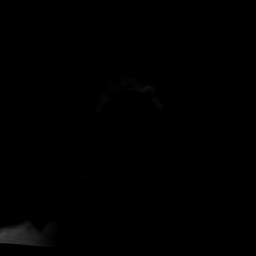
[im 12/72]
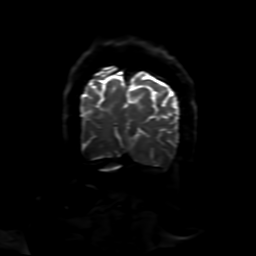
[im 24/72]
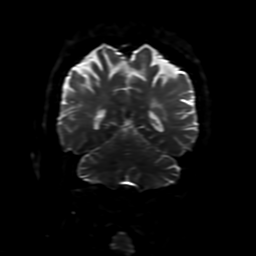
[im 36/72]
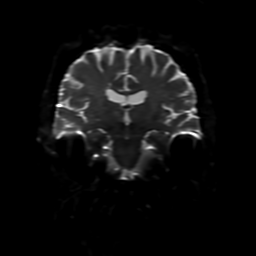
[im 48/72]
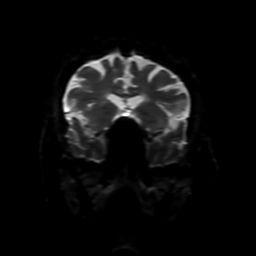
[im 60/72]
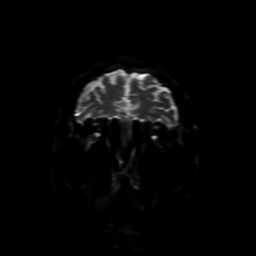
[im 72/72]
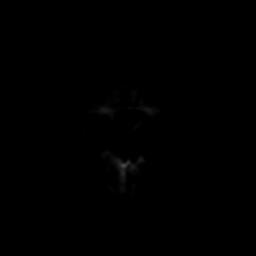

[Series 4: FLAIR · sagittal · 5.0mm · 0.23mm/px · 2 of 24 slices shown (1 of 2)]
[im 1/24]
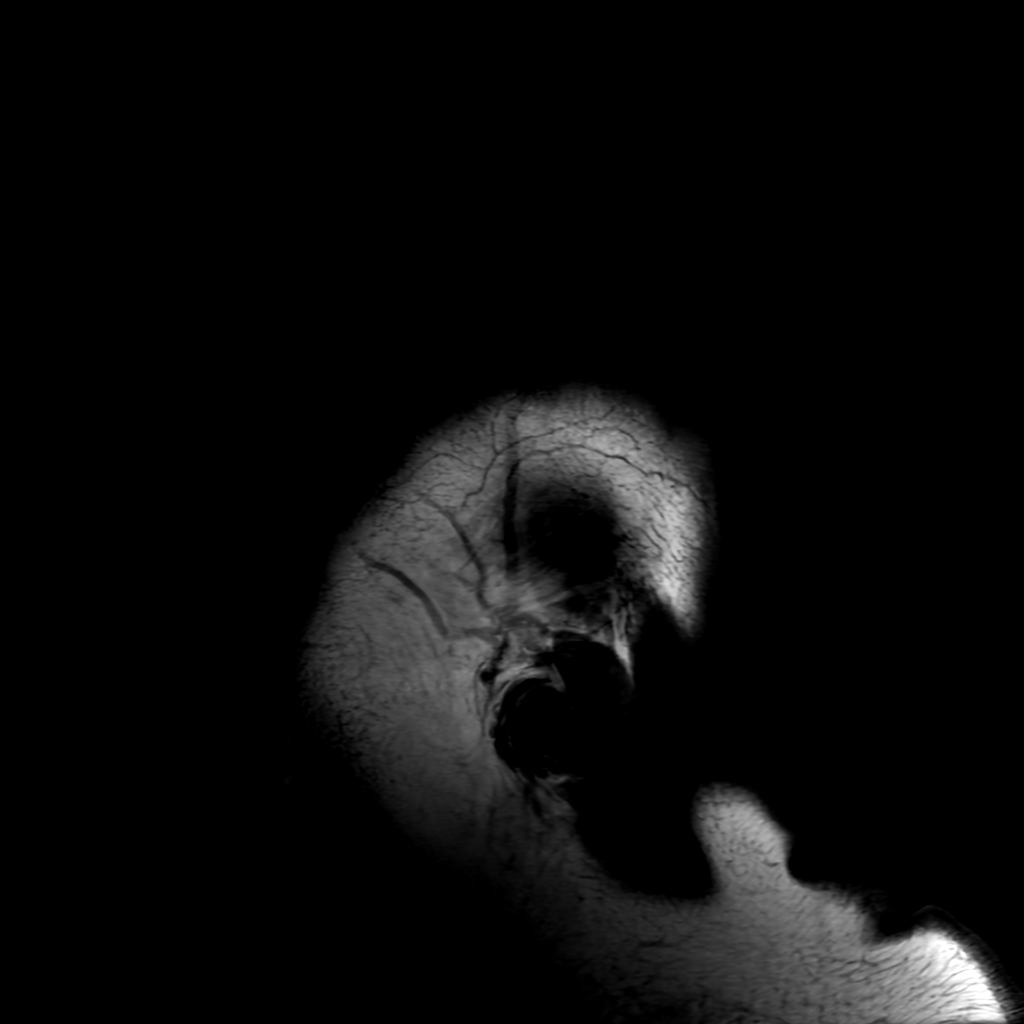
[im 24/24]
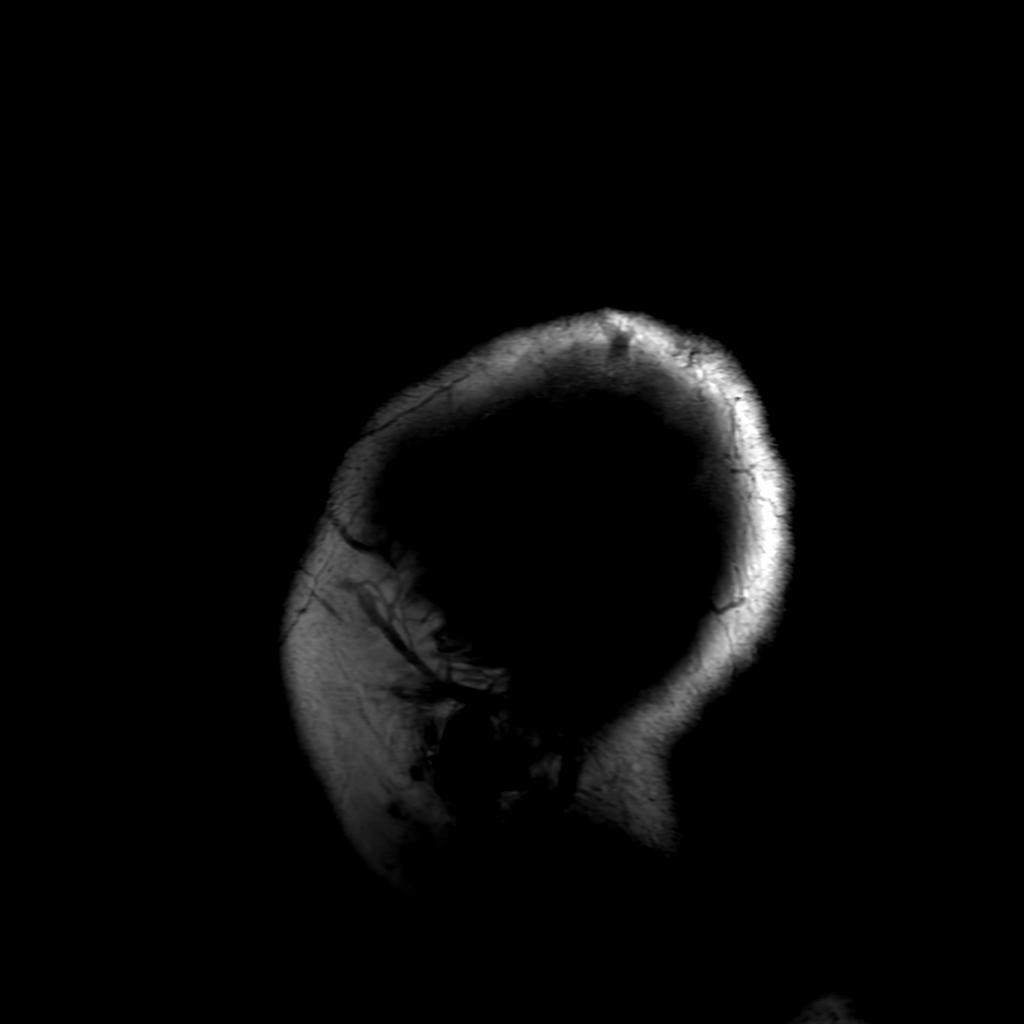

[Series 6: FLAIR · axial · 4.0mm · 0.45mm/px · z∈[-71,+74]mm · 3 of 34 slices shown (2 of 2)]
[im 1/34]
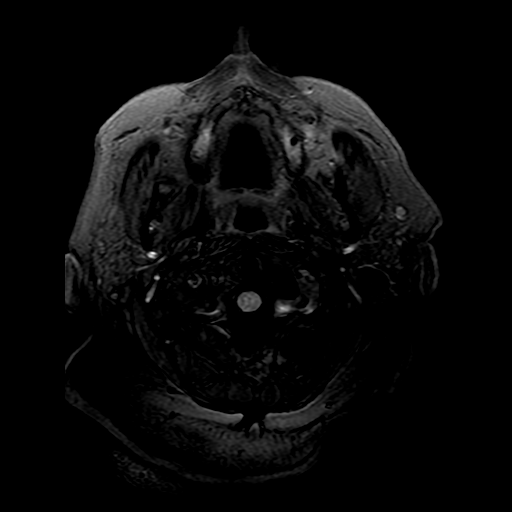
[im 17/34]
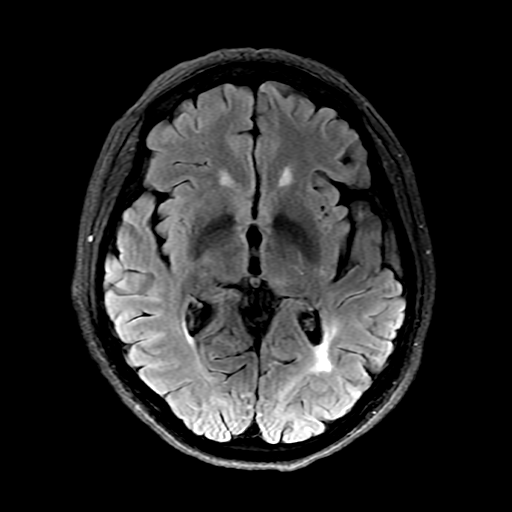
[im 34/34]
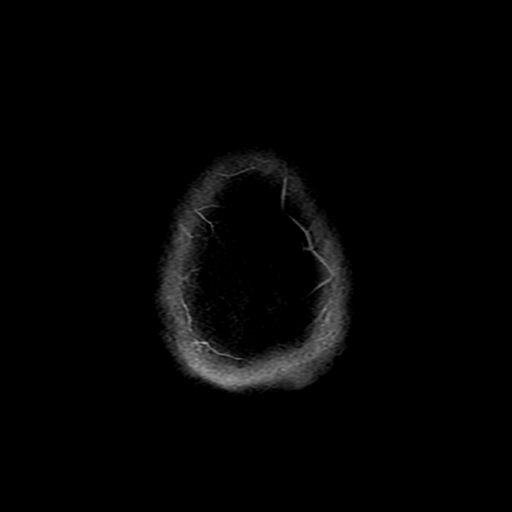

[Series 250: ADC · axial · 3.0mm · 0.94mm/px · z∈[-71,+73]mm · 5 of 49 slices shown (1 of 2)]
[im 1/49]
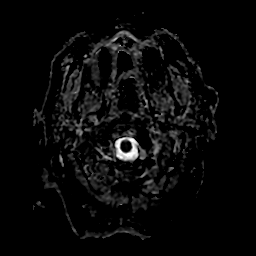
[im 13/49]
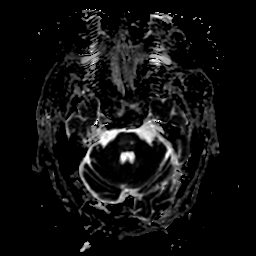
[im 25/49]
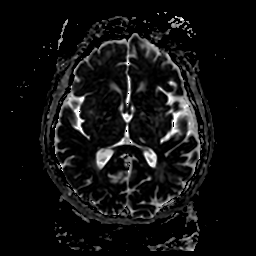
[im 37/49]
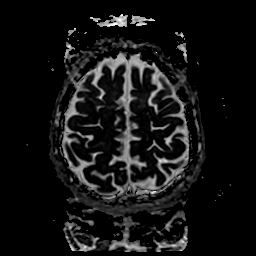
[im 49/49]
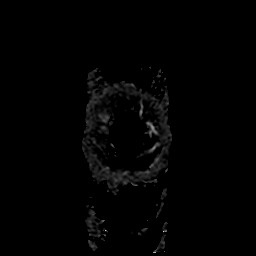

[Series 350: ADC · coronal · 4.0mm · 0.94mm/px · 3 of 36 slices shown (2 of 2)]
[im 1/36]
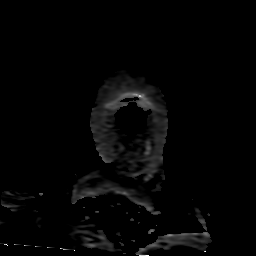
[im 18/36]
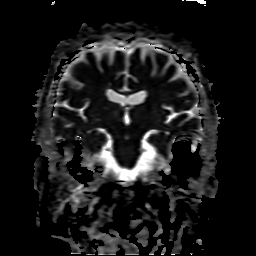
[im 36/36]
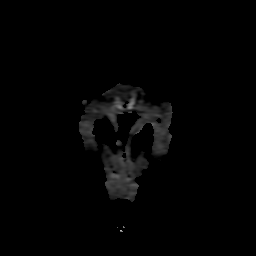

[29 of 48 positions shown; findings below may reference images not displayed]

FINDINGS: Brain: No restricted diffusion or evidence of acute infarction.

But there are chronic lacunar infarcts in the bilateral deep gray
nuclei, especially the thalami. And there is moderate T2 and FLAIR
heterogeneity throughout the pons which is substantially new or
progressed from the [JR] MRI. Scattered additional bilateral
cerebral white matter T2 and FLAIR hyperintensity, some nodular and
with small cystic areas of encephalomalacia (series 6, image 20 in
the left periatrial white matter. No cortical encephalomalacia
identified. But there is questionable chronic hemosiderin in the
pons. Cerebellum appears spared and normal.

No midline shift, mass effect, evidence of mass lesion,
ventriculomegaly, extra-axial collection or acute intracranial
hemorrhage. Cervicomedullary junction and pituitary are within
normal limits.

Vascular: Major intracranial vascular flow voids are stable since
[JR].

Skull and upper cervical spine: Normal visible cervical spine.
Visualized bone marrow signal is within normal limits.

Sinuses/Orbits: Negative orbits. Paranasal sinuses and mastoids are
stable and well aerated.

Other: Visible internal auditory structures appear normal. Negative
visible scalp and face.
IMPRESSION: 1. No acute intracranial abnormality.
2. But advanced signal changes in the brain substantially progressed
since the [JR] MRI. The configuration favors chronic small vessel
ischemia. Chronic demyelinating disease was also considered but felt
unlikely.

## 2021-08-26 NOTE — Progress Notes (Signed)
Subjective: POD # 2 s/p left partial fifth metatarsal amputation with wound closure.  States that she still having pain.  She was having quite a bit of pain.  She states that wearing a bandage makes the pain worse.  No fevers or chills.    As an aside she is still complaining of slurred speech.  Code stroke was activated yesterday.  I discussed this with the hospitalist today.    Objective: AAO x3, NAD Incision coapted with sutures, staples intact.  There is some mild skin changes present along the distal plantar aspect of the from incision but overall appears to be improved compared to yesterday.  There is no drainage or pus.  Mild tenderness to palpation.  There is no fluctuance or crepitation.  There is no malodor.            Assessment: POD #2 s/p left fifth metatarsal partial resection, wound excision, closure  Plan: Dressing removed today.  Upon removal of the foot felt better.  I left the dressing off for now.  Encouraged elevation and she states elevation and also hanging the foot down also makes it feel better.  She has pain medication ordered.  I will that she can be discharged with oral antibiotics pending pain control and medical clearance.  Podiatry will still follow.   Ovid Curd, DPM

## 2021-08-26 NOTE — Progress Notes (Signed)
PROGRESS NOTE  Anna [name]  DOB: 01/15/1956  PCP: Aviva Kluver GHW:[id number]  DOA: 08/19/2021  LOS: 7 days  Hospital Day: 8  Brief narrative: Anna [name] is a 66 y.o. female with PMH significant for obesity, OSA on CPAP, DM2, HTN, HLD, CVA, PAD, GERD, gastric bleeding, peripheral neuropathy, COPD, DVT. Patient has chronic wound and ulcer of left foot due to diabetic polyneuropathy as well as poor circulation and PAD.  She underwent left fifth toe amputation on 06/16/2021 at [facility] with podiatrist. Patient presented to the ED on 5/27 with left great toe ulcer as well as worsening infection at the site of previous left fifth ray amputation. In the ED, she was found to have an adherent eschar at the site of amputation with surrounding erythema and edema.  There is also an eschar in the left great toe. X-ray of left foot did not show any evidence of acute osteomyelitis. Admitted to hospitalist service 5/30, patient underwent left lower extremity angiogram and left anterior tibial artery angioplasty. 5/31, MRI left foot did not show any evidence of osteomyelitis or septic arthritis but showed soft tissue ulceration. See below for details. 6/1, patient underwent debridement of the great toe wound and left fifth toe metatarsal head excision. 6/2, patient complained of dysarthria.  Code stroke was called.  CT head and CT angio head and neck negative.  Subjective: Patient was seen and examined this morning. Lying on bed.  Not in distress.  Complains of pain at the surgical site. Complains that she still has slurring of speech.  MRI brain done this morning did not show any acute intracranial abnormality.  But shows chronic small vessel ischemic disease  Principal Problem:   Cellulitis Active Problems:   Diabetic neuropathy (HCC)   Accelerated hypertension   Type 2 diabetes mellitus (HCC)   COPD mixed type (HCC)   Hyperkalemia   OSA on CPAP    Assessment and Plan: Left  foot ulcer Peripheral artery disease -Seen by vascular surgery and podiatry.  No radiological evidence of osteomyelitis -5/30, underwent left anterior tibial artery angioplasty -No radiological evidence of osteomyelitis. -6/1, patient underwent debridement of the great toe wound and left fifth toe metatarsal head excision. -Podiatry follow-up appreciated.  Dressing changed today.  On IV antibiotics.  Recommended oral antibiotics at discharge.  Dysarthria TIA -6/2 patient woke up with clumsiness in both of her hands.  She was also having intermittent problems articulating her words.  She was anxious about the possibility of a stroke.  Code stroke was called.  Patient was seen by neurologist Dr. Roda Shutters.  CT head and CT angio of head and neck were done which were negative.  MRI brain obtained today did not show any acute intracranial hemorrhage.  It continued to show chronic small vessel disease.  Uncontrolled type 2 diabetes mellitus Diabetic neuropathy -A1c 8.2 on 08/12/2021 -Home meds include metformin, Toujeo 20 units nightly, Trulicity, Jardiance, sliding scale insulin -Currently on Semglee 18 units nightly along with sliding scale insulin with Accu-Cheks. -Continue Neurontin for neuropathy Recent Labs  Lab 08/25/21 1110 08/25/21 1606 08/25/21 2127 08/26/21 0623 08/26/21 1234  GLUCAP 107* 203* 126* 115* 117*     Elevated liver enzymes Hepatic steatosis -Significant elevation of liver enzymes noted on 5/31.  Unclear reason. Liver ultrasound showed hepatic steatosis. Fluctuating course but overall improving.  Continue to monitor.    -Nonreactive hepatitis panel. Recent Labs  Lab 08/20/21 0119 08/23/21 0052 08/24/21 0322 08/25/21 0352 08/26/21 0454  AST  19 687* 160* 272* 241*  ALT 12 309* 172* 189* 164*  ALKPHOS 45 172* 138* 253* 311*  BILITOT 0.5 0.5 0.6 0.4 0.8  PROT 6.5 5.6* 5.6* 6.1* 5.9*  ALBUMIN 3.2* 2.8* 2.7* 2.9* 2.8*  PLT 369  --  310 311 314    Essential  hypertension -Blood pressure currently controlled remains controlled without meds.  Lisinopril/HCTZ on hold.   -Continue hydralazine PRN   COPD -DuoNeb PRN    Chronic anemia -Stable hemoglobin.  Continue to monitor Recent Labs    08/19/21 1442 08/20/21 0119 08/22/21 0322 08/24/21 0322 08/25/21 0352 08/26/21 0454  HGB 11.3* 10.6*  --  9.2* 9.4* 9.1*  MCV 88.3 86.7  --  89.1 90.1 89.1  VITAMINB12  --   --  370  --   --   --   FOLATE  --   --  23.6  --   --   --   FERRITIN  --   --  18  --   --   --   TIBC  --   --  360  --   --   --   IRON  --   --  35  --   --   --   RETICCTPCT  --   --  1.3  --   --   --     Hyperlipidemia -Previously on Crestor. LDL low. Crestor stopped. Discussed with pharmacist   Anxiety -Continue Xanax PRN   GERD -Continue famotidine   Restless leg syndrome -Continue home Requip   OSA on CPAP -Continue CPAP  Morbid obesity  -Body mass index is 29.64 kg/m. Patient has been advised to make an attempt to improve diet and exercise patterns to aid in weight loss.  Goals of care   Code Status: Full Code    Mobility: Need PT evaluation postprocedure  Skin assessment:     Nutritional status:  Body mass index is 29.64 kg/m.  Nutrition Problem: Increased nutrient needs Etiology: wound healing Signs/Symptoms: estimated needs     Diet:  Diet Order             Diet Carb Modified Fluid consistency: Thin; Room service appropriate? Yes with Assist  Diet effective now                   DVT prophylaxis:  SCDs Start: 08/19/21 2138   Antimicrobials: IV cefepime, IV Flagyl, IV vancomycin Fluid: None Consultants: Podiatry, vascular surgery Family Communication: None at bedside  Status is: Inpatient  Continue in-hospital care because: if pain improved, mental status improved, plan to discharge home Level of care: Progressive Cardiac   Dispo: The patient is from: Home              Anticipated d/c is to: Hopefully home tomorrow               Patient currently is not medically stable to d/c.   Difficult to place patient No     Infusions:   sodium chloride     ceFEPime (MAXIPIME) IV 2 g (08/26/21 [id number])   vancomycin Stopped (08/26/21 0038)    Scheduled Meds:  acidophilus  2 capsule Oral Daily   aspirin EC  81 mg Oral Daily   busPIRone  25 mg Oral BID   diclofenac Sodium  2 g Topical QID   docusate sodium  100 mg Oral BID   famotidine  20 mg Oral BID   feeding supplement  237 mL Oral BID  BM   FLUoxetine  80 mg Oral Daily   gabapentin  300 mg Oral TID   guaiFENesin  600 mg Oral BID   insulin aspart  0-9 Units Subcutaneous TID WC   insulin glargine-yfgn  18 Units Subcutaneous QHS   multivitamin with minerals  1 tablet Oral Daily   nutrition supplement (JUVEN)  1 packet Oral BID BM   nystatin   Topical TID   pantoprazole  40 mg Oral Daily   polyethylene glycol  17 g Oral Daily   rOPINIRole  0.25 mg Oral QHS   senna  1 tablet Oral BID   sodium chloride flush  3 mL Intravenous Q12H   topiramate  25 mg Oral Daily    PRN meds: sodium chloride, acetaminophen, albuterol, ALPRAZolam, docusate sodium, hydrALAZINE, hydrALAZINE, HYDROcodone-acetaminophen, HYDROmorphone (DILAUDID) injection, ipratropium-albuterol, labetalol, loratadine, methocarbamol, ondansetron (ZOFRAN) IV, sodium chloride flush, traZODone   Antimicrobials: Anti-infectives (From admission, onward)    Start     Dose/Rate Route Frequency Ordered Stop   08/20/21 2230  vancomycin (VANCOCIN) IVPB 1000 mg/200 mL premix        1,000 mg 200 mL/hr over 60 Minutes Intravenous Every 24 hours 08/19/21 2146     08/19/21 2230  vancomycin (VANCOREADY) IVPB 1500 mg/300 mL        1,500 mg 150 mL/hr over 120 Minutes Intravenous  Once 08/19/21 2141 08/20/21 0056   08/19/21 2200  ceFEPIme (MAXIPIME) 2 g in sodium chloride 0.9 % 100 mL IVPB        2 g 200 mL/hr over 30 Minutes Intravenous Every 8 hours 08/19/21 1541     08/19/21 1400  metroNIDAZOLE (FLAGYL)  IVPB 500 mg        500 mg 100 mL/hr over 60 Minutes Intravenous Every 12 hours 08/19/21 1352 08/26/21 0859   08/19/21 1400  ceFEPIme (MAXIPIME) 2 g in sodium chloride 0.9 % 100 mL IVPB        2 g 200 mL/hr over 30 Minutes Intravenous  Once 08/19/21 1354 08/19/21 1603       Objective: Vitals:   08/26/21 0842 08/26/21 1030  BP: 134/62 137/67  Pulse: 71 77  Resp: 11 18  Temp: 98.4 F (36.9 C)   SpO2: 97% 100%    Intake/Output Summary (Last 24 hours) at 08/26/2021 1447 Last data filed at 08/26/2021 0611 Gross per 24 hour  Intake 1200 ml  Output 950 ml  Net 250 ml    Filed Weights   08/19/21 2135  Weight: 73.5 kg   Weight change:  Body mass index is 29.64 kg/m.   Physical Exam: General exam: Pleasant, not in distress Skin: No rashes, lesions or ulcers. HEENT: Atraumatic, normocephalic, no obvious bleeding Lungs: Clear to auscultation bilaterally CVS: Regular rate and rhythm, no murmur GI/Abd soft, nontender, nondistended, bowel sound present CNS: Alert, awake, oriented x3.  Not in distress Psychiatry: Mood appropriate. Extremities: No pedal edema, no calf tenderness.  Left foot has a bandage on. Data Review: I have personally reviewed the laboratory data and studies available.  F/u labs ordered Unresulted Labs (From admission, onward)    None       Signed, Lorin Glass, MD [facility] 08/26/2021

## 2021-08-26 NOTE — Progress Notes (Addendum)
STROKE TEAM PROGRESS NOTE   INTERVAL HISTORY No family at the bedside, on exam she is tearful, reporting pain in her foot.  On 6/2 she found herself clumsy with both her hands and difficulty holding cell phone and type in the cellphone. Then she was asking for coffee, which she subsequently spilled and felt her speech was off, more slurry, and not as fluent as before.  MRI scan of the brain done after rounds is negative for acute infarct and shows progressive changes of chronic small vessel disease. Vitals:   08/25/21 2335 08/26/21 0420 08/26/21 0842 08/26/21 1030  BP: 120/63 125/63 134/62 137/67  Pulse: 78 74 71 77  Resp: 18 18 11 18   Temp: 98.8 F (37.1 C) 98.8 F (37.1 C) 98.4 F (36.9 C)   TempSrc: Oral Oral    SpO2: 100% 93% 97% 100%  Weight:      Height:       CBC:  Recent Labs  Lab 08/25/21 0352 08/26/21 0454  WBC 8.8 10.5  NEUTROABS 3.8 5.6  HGB 9.4* 9.1*  HCT 31.0* 29.4*  MCV 90.1 89.1  PLT 311 314   Basic Metabolic Panel:  Recent Labs  Lab 08/20/21 0119 08/23/21 0052 08/25/21 0352 08/26/21 0454  NA 137   < > 139 136  K 4.3   < > 4.2 4.4  CL 106   < > 107 103  CO2 25   < > 27 25  GLUCOSE 197*   < > 144* 126*  BUN 12   < > 16 19  CREATININE 0.87   < > 0.85 0.82  CALCIUM 8.8*   < > 8.8* 8.8*  MG 1.8  --   --   --   PHOS 3.5  --   --   --    < > = values in this interval not displayed.   Lipid Panel:  Recent Labs  Lab 08/23/21 0052  CHOL 86  TRIG 107  HDL 42  CHOLHDL 2.0  VLDL 21  LDLCALC 23   HgbA1c:  Recent Labs  Lab 08/20/21 0119  HGBA1C 8.2*   Urine Drug Screen:  Recent Labs  Lab 08/19/21 2259  LABOPIA NONE DETECTED  COCAINSCRNUR NONE DETECTED  LABBENZ NONE DETECTED  AMPHETMU NONE DETECTED  THCU NONE DETECTED  LABBARB NONE DETECTED    Alcohol Level No results for input(s): ETH in the last 168 hours.  IMAGING past 24 hours MR BRAIN WO CONTRAST  Result Date: 08/26/2021 CLINICAL DATA:  66 year old female with garbled speech.  Weakness. Code stroke presentation yesterday. EXAM: MRI HEAD WITHOUT CONTRAST TECHNIQUE: Multiplanar, multiecho pulse sequences of the brain and surrounding structures were obtained without intravenous contrast. COMPARISON:  CT head and CTA head and neck 08/25/2021. Brain MRI [facility]18[facility]2012 [facility] FINDINGS: Brain: No restricted diffusion or evidence of acute infarction. 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 And there is moderate T2 and FLAIR heterogeneity throughout the pons which is substantially new or progressed from the 2014 MRI. Scattered additional bilateral cerebral white matter T2 and FLAIR hyperintensity, some nodular and with small cystic areas of encephalomalacia (series 6, image 20 in the left periatrial white matter. No cortical encephalomalacia identified. But there is questionable chronic hemosiderin in the pons. Cerebellum appears spared and normal. No midline shift, mass effect, evidence of mass lesion, ventriculomegaly, extra-axial collection or acute intracranial hemorrhage. Cervicomedullary junction and pituitary are within normal limits. Vascular:  Major intracranial vascular flow voids are stable since 2014. Skull and upper cervical spine: Normal visible cervical spine. Visualized bone marrow signal is within normal limits. Sinuses/Orbits: Negative orbits. Paranasal sinuses and mastoids are stable and well aerated. Other: Visible internal auditory structures appear normal. Negative visible scalp and face. IMPRESSION: 1. No acute intracranial abnormality. 2. But advanced signal changes in the brain substantially progressed since the 2014 MRI. The configuration favors chronic small vessel ischemia. Chronic demyelinating disease was also considered but felt unlikely. Electronically Signed   By: Odessa Fleming M.D.   On: 08/26/2021 12:19    PHYSICAL EXAM General - well nourished, well developed  middle-aged Caucasian lady who is intermittently crying and emotional, distress due to pain. Mental Status -  Level of arousal and orientation to time, place, and person were intact. Language including expression, naming, repetition, comprehension, reading, and writing was assessed and found intact. Intermittent hesitancy of speech but distractable, no clear aphasia or dysarthria.  Fund of Knowledge was assessed and was intact. Cranial Nerves II - XII - II - Vision intact OU. III, IV, VI - Extraocular movements intact. V - Facial sensation intact bilaterally. VII - Facial movement intact bilaterally. VIII - Hearing & vestibular intact bilaterally. X - Palate elevates symmetrically. XI - Chin turning & shoulder shrug intact bilaterally. XII - Tongue protrusion intact.   Motor Strength - The patient's strength was normal in all extremities and pronator drift was absent. Left foot in dressing with left toe amputation Motor Tone & Bulk - Muscle tone was assessed at the neck and appendages and was normal.  Bulk was normal and fasciculations were absent.  Reflexes - The patient's reflexes were normal in all extremities and she had no pathological reflexes. Sensory - Light touch, temperature/pinprick were assessed and were normal.   Coordination - The patient had normal movements in the hands and feet with no ataxia or dysmetria.  Tremor was absent. Gait and Station - deferred  ASSESSMENT/PLAN Anna [name] is a 66 y.o. 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 She underwent a left toe debridement of the left toe yesterday on 6/1 with podiatry.   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 CTA head & neck Marked stenoses of the proximal left P1 and P2 PCA. CT perfusion no perfusion deficit MRI  No acute intracranial abnormality 2D Echo EF 60-65% LDL 23 HgbA1c 8.2 VTE prophylaxis - SCDs    Diet   Diet Carb Modified Fluid consistency: Thin; Room service appropriate? Yes with Assist   clopidogrel 75 mg daily prior to admission, now on aspirin 81 mg daily.  Therapy recommendations:  home health Disposition:  pending  Hypertension Home meds:  lisinopril,  Stable Permissive hypertension (OK if < 220/120) but gradually normalize in 5-7 days Long-term BP goal normotensive  Hyperlipidemia Home meds:  crestor, resumed in hospital LDL 23, goal < 70 Continue statin at discharge  Diabetes type II Uncontrolled Home meds:  trulicity, insulin HgbA1c 8.2, goal < 7.0 CBGs Recent Labs    08/25/21 2127 08/26/21 0623 08/26/21 1234  GLUCAP 126* 115* 117*  SSI  Other Stroke Risk Factors Advanced Age >/= 35  Obesity, Body mass index is 29.64 kg/m., BMI >/= 30 associated with increased stroke risk, recommend weight loss, diet and exercise as appropriate  Obstructive sleep apnea on CPAP   Other Active Problems Left foot ulcer r/t peripheral artery disease- management by vascular and podiatry  Hepatic stenosis with elevated liver enzymes COPD Chronic anemia Restless leg syndrome  Hospital day # 7  Patient seen and examined by NP/APP with MD. MD to update note as needed.   Anna Picker, Anna [name], Anna [name] [facility] Pager: [phone]  I have personally obtained history,examined this patient, reviewed notes, independently viewed imaging studies, participated in medical decision making and plan of care.ROS completed by me personally and pertinent positives fully documented  I have made any additions or clarifications directly to the above note. Agree with note above.  Patient  presented with intermittent speech difficulties and a lot of functional overlay to her symptoms with normal organic exam and MRI is negative for acute stroke.  Recommend treatment of pain as per primary team.  No further stroke work-up is needed.  Stroke team will sign off.  Kindly call for questions.  Discussed with Dr.Dahal.  Greater than 50% time during this 50-minute visit was spent in counseling and coordination of care about her speech disturbance and possible TIA versus nonorganic etiology discussion with care team and answering questions.  Delia Heady, MD Medical Director [facility] Pager: [phone] 08/26/2021 3:52 PM   To contact Stroke Continuity provider, please refer to [email or web address]. After hours, contact General Neurology

## 2021-08-26 NOTE — Progress Notes (Addendum)
Pt called out after IV vanc admin about 3/4 of the bag w/ c/o IV itching. LFA IV infiltrated w/ edematous IV site. No pain or redness, just itching. Instructed pt to elevate arm on pillow and gave heat pack. New IV to be placed and abx to resume when access obtained.

## 2021-08-26 NOTE — Op Note (Signed)
PATIENT:  Anna [name]  66 y.o. female   PRE-OPERATIVE DIAGNOSIS:  DIABETIC ULCER left foot, metatarsal deformity   POST-OPERATIVE DIAGNOSIS:  DIABETIC ULCER left foot, metatarsal deformity   PROCEDURE:  Procedure(s): DEBRIDEMENT WOUND OF LEFT FOOT (Left) METATARSAL HEAD EXCISION FIFTH TOE (Left) Delayed primary closure of wound left foot   SURGEON:  Surgeon(s) and Role:    * Vivi Barrack, DPM - Primary   PHYSICIAN ASSISTANT:    ASSISTANTS: none    ANESTHESIA:   none   EBL:  minimal    BLOOD ADMINISTERED:none   DRAINS: none    LOCAL MEDICATIONS USED:  OTHER lidocaine and marcaine plain   SPECIMEN:  No Specimen   DISPOSITION OF SPECIMEN:  N/A   COUNTS:  YES   TOURNIQUET:  * No tourniquets in log *   DICTATION: .Dragon Dictation   PLAN OF CARE: Admit to inpatient    PATIENT DISPOSITION:  PACU - hemodynamically stable.   Delay start of Pharmacological VTE agent (>24hrs) due to surgical blood loss or risk of bleeding: no   Intraoperative findings: Resected part of 5th metatarsal. Able to close wound. Minimal bleeding noted.   Recommend continue IV antibiotics with likely discharge Saturday with close monitoring. Will d/c with PO abx.   Indications for surgery: 66 year old female admitted to the hospital for second opinion given wound, PAD in her left foot.  She previous underwent a 5th toe amputation at an outside hospital which also did not heal.  She was admitted and underwent vascular intervention.  Podiatry was also consulted.  Given the nature of the wound discussed with the patient surgical debridement and likely resection of the fifth metatarsal distally to allow for wound closure versus graft application.  We discussed the surgery as well as postoperative course.  Alternatives, risks, complications were discussed.  No promises or guarantees were given of that, the procedure and all questions were answered the best my ability.  Patient understands that  she is a high risk of limb loss and she states that she is not ready to lose her leg yet.  Procedure in detail: The patient's with verbally and visually identified by myself, nursing staff, the anesthesia staff preoperatively.  She was then transferred to the operating room via stretcher and placed on the operating table in supine position.  After adequate plane of anesthesia was obtained a mixture of lidocaine, Marcaine plain was infiltrated regional block fashion after timeout was performed.  The left lower extremities and scrubbed, prepped, draped in normal sterile fashion.  Secondary timeout was performed.  I started by utilizing a 15 blade scalpel to excise the area of necrotic tissue that is present on the fifth metatarsal head.  The area of necrotic tissue that was debrided was approximately 1.8 x 1.5 cm to the level of the bone.  This was down to the level of the fifth metatarsal head.  The incision was then carried proximally to identify the fifth metatarsal distally.  The metatarsal appeared to be viable however given the wound in order to help get the wound closed I did resect the distal portion of the fifth metatarsal with a sagittal saw.  The remaining bone appeared to be viable.  At this time the wound was copiously irrigated with saline hemostasis was achieved.  I was able to close the wound mostly.  The distal portion of the wound had some opening as I did not want to close it completely with too much tension.  The wound  was closed with nylon as well as skin staples.  Xeroform was applied followed by dry sterile dressing.  She at this time she was awoken from anesthesia and found to tolerate the procedure well any complications.  She was transferred to PACU vital signs stable and vascular status intact.  Postoperative course: Patient remain inpatient on IV antibiotics for 24 to 48 hours.  Likely be ultimately discharged with oral antibiotics for a week with close follow-up in the office.   Weight-bear as tolerated in surgical shoe but needs to limit the amount of weightbearing to help facilitate healing.

## 2021-08-27 LAB — GLUCOSE, CAPILLARY
Glucose-Capillary: 212 mg/dL — ABNORMAL HIGH (ref 70–99)
Glucose-Capillary: 86 mg/dL (ref 70–99)
Glucose-Capillary: 187 mg/dL — ABNORMAL HIGH (ref 70–99)

## 2021-08-27 MED ORDER — ASPIRIN 81 MG PO TBEC: 81.0000 mg | DELAYED_RELEASE_TABLET | Freq: Every day | ORAL | 2 refills | Status: AC

## 2021-08-27 MED ORDER — RISAQUAD PO CAPS: 2.0000 | ORAL_CAPSULE | Freq: Every day | ORAL | 0 refills | Status: DC

## 2021-08-27 MED ORDER — HYDROCODONE-ACETAMINOPHEN 5-325 MG PO TABS: 1.0000 | ORAL_TABLET | Freq: Four times a day (QID) | ORAL | 0 refills | Status: AC | PRN

## 2021-08-27 MED ORDER — ENSURE ENLIVE PO LIQD: 237.0000 mL | Freq: Two times a day (BID) | ORAL | 12 refills | Status: DC

## 2021-08-27 MED ORDER — METFORMIN HCL 500 MG PO TABS
1000.0000 mg | ORAL_TABLET | Freq: Two times a day (BID) | ORAL | Status: DC
  Administered 2021-08-27 (×2): 1000 mg via ORAL
  Filled 2021-08-27 (×2): qty 2

## 2021-08-27 MED ORDER — EMPAGLIFLOZIN 10 MG PO TABS
10.0000 mg | ORAL_TABLET | Freq: Every day | ORAL | Status: DC
  Administered 2021-08-27: 10 mg via ORAL
  Filled 2021-08-27: qty 1

## 2021-08-27 MED ORDER — RISAQUAD PO CAPS: 2.0000 | ORAL_CAPSULE | Freq: Every day | ORAL | 0 refills | Status: AC

## 2021-08-27 MED ORDER — ASPIRIN 81 MG PO TBEC: 81.0000 mg | DELAYED_RELEASE_TABLET | Freq: Every day | ORAL | 2 refills | Status: DC

## 2021-08-27 MED ORDER — INSULIN GLARGINE-YFGN 100 UNIT/ML ~~LOC~~ SOLN
20.0000 [IU] | Freq: Every day | SUBCUTANEOUS | Status: DC
  Filled 2021-08-27: qty 0.2

## 2021-08-27 MED ORDER — AMOXICILLIN-POT CLAVULANATE 875-125 MG PO TABS: 1.0000 | ORAL_TABLET | Freq: Two times a day (BID) | ORAL | 0 refills | Status: AC

## 2021-08-27 MED ORDER — AMOXICILLIN-POT CLAVULANATE 875-125 MG PO TABS: 1.0000 | ORAL_TABLET | Freq: Two times a day (BID) | ORAL | 0 refills | Status: DC

## 2021-08-27 MED ORDER — AMOXICILLIN-POT CLAVULANATE 875-125 MG PO TABS
1.0000 | ORAL_TABLET | Freq: Two times a day (BID) | ORAL | Status: DC
  Administered 2021-08-27: 1 via ORAL
  Filled 2021-08-27: qty 1

## 2021-08-27 NOTE — Discharge Summary (Signed)
Physician Discharge Summary  Anna [name] QMV:[id number] DOB: 01-17-1956 DOA: 08/19/2021  PCP: Oneita Hurt, No  Admit date: 08/19/2021 Discharge date: 08/27/2021  Admitted From: Home Discharge disposition: Home with home health services  Recommendations at discharge:  Antibiotics for 1 week.  Pain medicines as needed  follow-up with podiatry as an outpatient  follow-up with PCP as an outpatient to get repeat liver enzymes levels checked in 1 week  Brief narrative: Anna [name] is a 66 y.o. female with PMH significant for obesity, OSA on CPAP, DM2, HTN, HLD, CVA, PAD, GERD, gastric bleeding, peripheral neuropathy, COPD, DVT. Patient has chronic wound and ulcer of left foot due to diabetic polyneuropathy as well as poor circulation and PAD.  She underwent left fifth toe amputation on 06/16/2021 at [facility] with podiatrist. Patient presented to the ED on 5/27 with left great toe ulcer as well as worsening infection at the site of previous left fifth ray amputation. In the ED, she was found to have an adherent eschar at the site of amputation with surrounding erythema and edema.  There is also an eschar in the left great toe. X-ray of left foot did not show any evidence of acute osteomyelitis. Admitted to hospitalist service 5/30, patient underwent left lower extremity angiogram and left anterior tibial artery angioplasty. 5/31, MRI left foot did not show any evidence of osteomyelitis or septic arthritis but showed soft tissue ulceration. See below for details. 6/1, patient underwent debridement of the great toe wound and left fifth toe metatarsal head excision. 6/2, patient complained of dysarthria.  Code stroke was called.  CT head and CT angio head and neck negative.  Subjective: Patient was seen and examined this morning. Lying on bed.  Not in distress.  Pain improving. Podiatry follow-up appreciated.  Patient feels comfortable going home.  Principal Problem:    Cellulitis Active Problems:   Diabetic neuropathy (HCC)   Accelerated hypertension   Type 2 diabetes mellitus (HCC)   COPD mixed type (HCC)   Hyperkalemia   OSA on CPAP    Hospital course: Left foot ulcer Peripheral artery disease -Seen by vascular surgery and podiatry.  No radiological evidence of osteomyelitis -5/30, underwent left anterior tibial artery angioplasty.  Currently on aspirin and Plavix. -No radiological evidence of osteomyelitis. -6/1, patient underwent debridement of the great toe wound and left fifth toe metatarsal head excision. -Podiatry follow-up appreciated.  Dressing changed today.  Currently improving on IV antibiotics.  Discussed with Dr. Ardelle Anton this morning.  Plan to discharge on 1 week of Augmentin.  Dysarthria TIA -6/2 patient woke up with clumsiness in both of her hands.  She was also having intermittent problems articulating her words.  She was anxious about the possibility of a stroke.  Code stroke was called.  Patient was seen by neurologist Dr. Roda Shutters.  CT head and CT angio of head and neck were done which were negative.  MRI brain did not show any acute intracranial hemorrhage.  It continued to show chronic small vessel disease. -Continue aspirin and Plavix.  Used to be on Crestor in the past.  Currently on hold because of elevated liver enzyme level  Uncontrolled type 2 diabetes mellitus Diabetic neuropathy -A1c 8.2 on 08/12/2021 -Home meds include metformin, Toujeo 20 units nightly, Trulicity, Jardiance, sliding scale insulin -Resume the same regimen at home.   Elevated liver enzymes Hepatic steatosis -Significant elevation of liver enzymes noted on 5/31.  Unclear reason. Liver ultrasound showed hepatic steatosis. Fluctuating course but overall  improving.  Continue to follow-up with PCP as an outpatient for repeat liver enzymes level checked. -Nonreactive hepatitis panel.  Essential hypertension -Blood pressure currently controlled.  Lisinopril  currently on hold.  Resume post discharge -Continue hydralazine PRN   COPD -DuoNeb PRN    Chronic anemia -Stable hemoglobin.  Continue to monitor as an outpatient Recent Labs    08/19/21 1442 08/20/21 0119 08/22/21 0322 08/24/21 0322 08/25/21 0352 08/26/21 0454  HGB 11.3* 10.6*  --  9.2* 9.4* 9.1*  MCV 88.3 86.7  --  89.1 90.1 89.1  VITAMINB12  --   --  370  --   --   --   FOLATE  --   --  23.6  --   --   --   FERRITIN  --   --  18  --   --   --   TIBC  --   --  360  --   --   --   IRON  --   --  35  --   --   --   RETICCTPCT  --   --  1.3  --   --   --    Hyperlipidemia -Previously on Crestor. LDL low. Crestor stopped. Discussed with pharmacist   Anxiety -Continue Xanax PRN   GERD -Continue famotidine   Restless leg syndrome -Continue home Requip   OSA on CPAP -Continue CPAP  Morbid obesity  -Body mass index is 29.64 kg/m. Patient has been advised to make an attempt to improve diet and exercise patterns to aid in weight loss.  Goals of care   Code Status: Full Code    Mobility: Need PT evaluation postprocedure  Skin assessment:     Nutritional status:  Body mass index is 29.64 kg/m.  Nutrition Problem: Increased nutrient needs Etiology: wound healing Signs/Symptoms: estimated needs      Wounds:  - Wound / Incision (Open or Dehisced) 08/19/21 Diabetic ulcer Toe (Comment  which one) Anterior;Left (Active)  Date First Assessed/Time First Assessed: 08/19/21 2115   Wound Type: Diabetic ulcer  Location: (c) Toe (Comment  which one)  Location Orientation: Anterior;Left  Present on Admission: Yes    Assessments 08/19/2021 11:00 PM 08/25/2021 12:00 PM  Dressing Type -- Compression wrap;Gauze (Comment)  Dressing Status -- Clean, Dry, Intact  Site / Wound Assessment -- Dressing in place / Unable to assess  Wound Length (cm) 1 cm --  Wound Width (cm) 1 cm --  Wound Depth (cm) 0.1 cm --  Wound Volume (cm^3) 0.1 cm^3 --  Wound Surface Area (cm^2) 1 cm^2 --      No Linked orders to display     Wound / Incision (Open or Dehisced) 08/19/21 Other (Comment) Toe (Comment  which one) Left (Active)  Date First Assessed: 08/19/21   Wound Type: Other (Comment)  Location: (c) Toe (Comment  which one)  Location Orientation: Left  Present on Admission: Yes    Assessments 08/19/2021  9:47 PM 08/26/2021  1:00 PM  Dressing Type None --  Dressing Status None None  Site / Wound Assessment -- Black  Wound Length (cm) 2 cm --  Wound Width (cm) 1.2 cm --  Wound Depth (cm) 0.1 cm --  Wound Volume (cm^3) 0.24 cm^3 --  Wound Surface Area (cm^2) 2.4 cm^2 --  Drainage Amount -- None     No Linked orders to display     Incision (Closed) 08/24/21 Foot Left (Active)  Date First Assessed/Time First Assessed: 08/24/21 1821  Location: Foot  Location Orientation: Left    Assessments 08/24/2021  6:35 PM 08/26/2021  8:03 PM  Dressing Type Compression wrap None  Dressing Clean, Dry, Intact --  Site / Wound Assessment Dressing in place / Unable to assess Clean;Dry  Margins -- Attached edges (approximated)  Closure -- Sutures  Drainage Amount None None     No Linked orders to display    Discharge Exam:   Vitals:   08/27/21 0006 08/27/21 0309 08/27/21 0817 08/27/21 1225  BP: (!) 143/67 (!) 144/65 (!) 147/71 139/63  Pulse: 74 79 (!) 59 76  Resp: Temp: 97.7 F (36.5 C) 99 F (37.2 C) 98.4 F (36.9 C) 98.7 F (37.1 C)  TempSrc: Axillary Oral Oral Oral  SpO2: 96% 96% 98% 100%  Weight:      Height:        Body mass index is 29.64 kg/m.  General exam: Pleasant, not in distress Skin: No rashes, lesions or ulcers. HEENT: Atraumatic, normocephalic, no obvious bleeding Lungs: Clear to auscultation bilaterally CVS: Regular rate and rhythm, no murmur GI/Abd soft, nontender, nondistended, bowel sound present CNS: Alert, awake, oriented x3.  Not in distress Psychiatry: Mood appropriate. Extremities: No pedal edema, no calf tenderness.  Left foot has  healthy looking incisional wound.  Follow ups:    Follow-up [employer], [address] [facility] Follow up.   Why: They will be setting up home health for next week PT OT RN ( for wound left foot) Contact information: [name] [place] [address] [phone]         Vivi Barrack, DPM Follow up.   Specialty: Podiatry Contact information: [address] [place] [address] [place] [address] [phone]                 Discharge Instructions:   Discharge Instructions     Call MD for:  difficulty breathing, headache or visual disturbances   Complete by: As directed    Call MD for:  extreme fatigue   Complete by: As directed    Call MD for:  hives   Complete by: As directed    Call MD for:  persistant dizziness or light-headedness   Complete by: As directed    Call MD for:  persistant nausea and vomiting   Complete by: As directed    Call MD for:  severe uncontrolled pain   Complete by: As directed    Call MD for:  temperature >100.4   Complete by: As directed    Diet general   Complete by: As directed    Discharge instructions   Complete by: As directed    Recommendations at discharge:   Antibiotics for 1 week.  Pain medicines as needed   follow-up with podiatry as an outpatient   follow-up with PCP as an outpatient to get repeat liver enzymes levels checked in 1 week  Discharge instructions for diabetes mellitus: Check blood sugar 3 times a day and bedtime at home. If blood sugar running above 200 or less than 70 please call your MD to adjust insulin. If you notice signs and symptoms of hypoglycemia (low blood sugar) like jitteriness, confusion, thirst, tremor and sweating, please check blood sugar, drink sugary drink/biscuits/sweets to increase sugar level and call MD or return to ER.    General discharge instructions: Follow with Primary MD Pcp, No in 7 days  Please request your PCP  to go  over your  hospital tests, procedures, radiology results at the follow up. Please get your medicines reviewed and adjusted.  Your PCP may decide to repeat certain labs or tests as needed. Do not drive, operate heavy machinery, perform activities at heights, swimming or participation in water activities or provide baby sitting services if your were admitted for syncope or siezures until you have seen by Primary MD or a Neurologist and advised to do so again. [place] Controlled Substance Reporting System database was reviewed. Do not drive, operate heavy machinery, perform activities at heights, swim, participate in water activities or provide baby-sitting services while on medications for pain, sleep and mood until your outpatient physician has reevaluated you and advised to do so again.  You are strongly recommended to comply with the dose, frequency and duration of prescribed medications. Activity: As tolerated with Full fall precautions use walker/cane & assistance as needed Avoid using any recreational substances like cigarette, tobacco, alcohol, or non-prescribed drug. If you experience worsening of your admission symptoms, develop shortness of breath, life threatening emergency, suicidal or homicidal thoughts you must seek medical attention immediately by calling 911 or calling your MD immediately  if symptoms less severe. You must read complete instructions/literature along with all the possible adverse reactions/side effects for all the medicines you take and that have been prescribed to you. Take any new medicine only after you have completely understood and accepted all the possible adverse reactions/side effects.  Wear Seat belts while driving. You were cared for by a hospitalist during your hospital stay. If you have any questions about your discharge medications or the care you received while you were in the hospital after you are discharged, you can call the unit and ask to speak with the  hospitalist or the covering physician. Once you are discharged, your primary care physician will handle any further medical issues. Please note that NO REFILLS for any discharge medications will be authorized once you are discharged, as it is imperative that you return to your primary care physician (or establish a relationship with a primary care physician if you do not have one).   Discharge wound care:   Complete by: As directed    Increase activity slowly   Complete by: As directed        Discharge Medications:   Allergies as of 08/27/2021       Reactions   Bacitracin    Other reaction(s): Other Burns skin   Citalopram Diarrhea   Doxycycline Other (See Comments)   Other reaction(s): Other "burning" all over body   Morphine And Related Hives   Neomycin    Other reaction(s): Other (See Comments), Unknown unknown   Ciprofloxacin    Tongue swells   Ciprofloxacin-dexamethasone Other (See Comments)   unknown   Duloxetine    Other reaction(s): Mental Status Changes (intolerance) "saw pictures of gun"   Latex Itching   Levaquin [levofloxacin In D5w] Other (See Comments)   Pt does not remember reaction but states she's had issues with med   Piper    Other reaction(s): Other (See Comments) White pepper- uncontrollable sneezing    Salvia Officinalis    Other reaction(s): Other (See Comments) Sneezing   Soma [carisoprodol]    Sleepy and constipation   Lasix [furosemide] Rash        Medication List     STOP taking these medications    doxycycline 100 MG capsule Commonly known as: VIBRAMYCIN   hydrochlorothiazide 25 MG tablet Commonly known as: HYDRODIURIL  prochlorperazine 10 MG tablet Commonly known as: COMPAZINE       TAKE these medications    acetaminophen 500 MG tablet Commonly known as: TYLENOL Take 1,000 mg by mouth every 6 (six) hours as needed for moderate pain.   acidophilus Caps capsule Take 2 capsules by mouth daily for 7 days. Start taking  on: August 28, 2021   albuterol 108 (90 Base) MCG/ACT inhaler Commonly known as: VENTOLIN HFA Inhale 1-2 puffs into the lungs every 6 (six) hours as needed for wheezing or shortness of breath.   ALPRAZolam 1 MG tablet Commonly known as: XANAX Take 1 mg by mouth 3 (three) times daily as needed for anxiety.   amoxicillin-clavulanate 875-125 MG tablet Commonly known as: AUGMENTIN Take 1 tablet by mouth every 12 (twelve) hours for 7 days.   aspirin EC 81 MG tablet Take 1 tablet (81 mg total) by mouth daily. Swallow whole. Start taking on: August 28, 2021   Baclofen 5 MG Tabs Take 5 mg by mouth 3 (three) times daily as needed (muscle spasm).   busPIRone 10 MG tablet Commonly known as: BUSPAR Take 25 mg by mouth 2 (two) times daily.   clopidogrel 75 MG tablet Commonly known as: PLAVIX Take 75 mg by mouth daily.   [employer] Caps Take 1 capsule by mouth daily.   diclofenac Sodium 1 % Gel Commonly known as: VOLTAREN Apply 4 g topically 4 (four) times daily as needed (pain).   docusate sodium 100 MG capsule Commonly known as: COLACE Take 100 mg by mouth 2 (two) times daily as needed for mild constipation.   empagliflozin 10 MG Tabs tablet Commonly known as: JARDIANCE Take 10 mg by mouth daily.   esomeprazole 40 MG capsule Commonly known as: NEXIUM Take 40 mg by mouth 2 (two) times daily before a meal.   estradiol 0.1 MG/GM vaginal cream Commonly known as: ESTRACE Place 1 Applicatorful vaginally See admin instructions. Three times a week   famotidine 20 MG tablet Commonly known as: PEPCID Take 20 mg by mouth 2 (two) times daily.   feeding supplement Liqd Take 237 mLs by mouth 2 (two) times daily between meals.   FLUoxetine 40 MG capsule Commonly known as: PROZAC Take 80 mg by mouth daily.   fluticasone 50 MCG/ACT nasal spray Commonly known as: FLONASE Place 1 spray into both nostrils daily as needed for allergies or rhinitis.   gabapentin 300 MG  capsule Commonly known as: NEURONTIN Take 300 mg by mouth 3 (three) times daily.   guaiFENesin 600 MG 12 hr tablet Commonly known as: MUCINEX Take 600 mg by mouth daily as needed for cough.   HYDROcodone-acetaminophen 5-325 MG tablet Commonly known as: NORCO/VICODIN Take 1 tablet by mouth every 6 (six) hours as needed.   hydrocortisone 25 MG suppository Commonly known as: ANUSOL-HC Place 25 mg rectally 3 (three) times daily as needed for hemorrhoids or anal itching.   hydrOXYzine 10 MG tablet Commonly known as: ATARAX Take 10 mg by mouth 2 (two) times daily.   ibuprofen 200 MG tablet Commonly known as: ADVIL Take 600 mg by mouth every 8 (eight) hours as needed for moderate pain.   insulin lispro 100 UNIT/ML KwikPen Commonly known as: HUMALOG Inject 0-9 Units into the skin 3 (three) times daily. Sliding scale: less than 150 -0 units 150-200 - 2 units 201-250 - 3 units 251-300 - 4 units 301-350 - 5 units 351-400 - 6 units 401-450 - 7 units 451-500 - 8 units  501-550- 9 units Greater than 550 notify provider.   ipratropium-albuterol 0.5-2.5 (3) MG/3ML Soln Commonly known as: DUONEB Take 3 mLs by nebulization every 6 (six) hours as needed (wheezing).   lidocaine 4 % Place 1 patch onto the skin daily as needed (pain).   lisinopril 5 MG tablet Commonly known as: ZESTRIL Take 5 mg by mouth daily.   loperamide 2 MG capsule Commonly known as: IMODIUM Take 2 mg by mouth daily as needed for diarrhea or loose stools.   loratadine 10 MG tablet Commonly known as: CLARITIN Take 10 mg by mouth daily as needed for allergies.   meclizine 25 MG tablet Commonly known as: ANTIVERT Take 25 mg by mouth 4 (four) times daily as needed for dizziness.   metFORMIN 1000 MG tablet Commonly known as: GLUCOPHAGE Take 1,000 mg by mouth 2 (two) times daily with a meal.   multivitamin with minerals Tabs tablet Take 1 tablet by mouth daily.   nystatin cream Commonly known as:  MYCOSTATIN Apply 1 application. topically 4 (four) times daily as needed for dry skin.   ondansetron 4 MG disintegrating tablet Commonly known as: ZOFRAN-ODT Take 4 mg by mouth every 8 (eight) hours as needed for nausea or vomiting.   polyethylene glycol 17 g packet Commonly known as: MIRALAX / GLYCOLAX Take 17 g by mouth daily as needed for moderate constipation.   potassium chloride SA 20 MEQ tablet Commonly known as: KLOR-CON M Take 20 mEq by mouth daily.   promethazine 12.5 MG tablet Commonly known as: PHENERGAN Take 1 tablet (12.5 mg total) by mouth every 6 (six) hours as needed for nausea or vomiting.   rOPINIRole 0.25 MG tablet Commonly known as: REQUIP Take 0.25 mg by mouth at bedtime.   rosuvastatin 20 MG tablet Commonly known as: CRESTOR Take 20 mg by mouth daily.   senna 8.6 MG Tabs tablet Commonly known as: SENOKOT Take 2 tablets by mouth daily as needed for mild constipation.   sucralfate 1 GM/10ML suspension Commonly known as: CARAFATE Take 1 g by mouth 4 (four) times daily as needed (stomch ulcer).   topiramate 25 MG tablet Commonly known as: TOPAMAX Take 25 mg by mouth daily.   Toujeo SoloStar 300 UNIT/ML Solostar Pen Generic drug: insulin glargine (1 Unit Dial) Inject 20 Units into the skin at bedtime.   traZODone 50 MG tablet Commonly known as: DESYREL Take 25-50 mg by mouth at bedtime as needed for sleep.   Trulicity 1.5 MG/0.5ML Sopn Generic drug: Dulaglutide Inject 3 mg into the skin once a week. Fridays   Tylenol Sinus+Headache 5-325 MG Tabs Generic drug: Phenylephrine-Acetaminophen Take 2 tablets by mouth daily as needed (headache).               Discharge Care Instructions  (From admission, onward)           Start     Ordered   08/27/21 0000  Discharge wound care:        08/27/21 1237             The results of significant diagnostics from this hospitalization (including imaging, microbiology, ancillary and  laboratory) are listed below for reference.    Procedures and Diagnostic Studies:   DG Foot Complete Left  Result Date: 08/19/2021 CLINICAL DATA:  Left foot infection. History of diabetes and diabetic neuropathy. Patient reports infection to left foot from left pinky toe amputation 06/16/2021. Wound black with redness around it. Pain and chills. EXAM: LEFT FOOT - COMPLETE 3+  VIEW COMPARISON:  Left foot radiographs 07/30/2021 and 08/15/2014 FINDINGS: Postsurgical changes are again seen of amputation of the fifth toe phalanges. There is mildly increased lucency within the soft tissues at the amputation site. No cortical erosion is seen within the adjacent fifth metatarsal or fourth metatarsal/fourth toe proximal phalangeal bones. No significant osteoarthritis. Unchanged chronic minimally displaced fracture of the base of fifth metatarsal. IMPRESSION: Status post fifth toe phalangeal amputation unchanged. Mildly increased lucency within the soft tissues at the distal amputation site may represent the reported soft tissue infection. No cortical erosion is seen to indicate radiographic evidence of acute osteomyelitis. Electronically Signed   By: Neita Garnet M.D.   On: 08/19/2021 14:27   VAS [place] ABI WITH/WO TBI  Result Date: 08/20/2021  LOWER EXTREMITY DOPPLER STUDY Patient Name:  MARCELLINA JONSSON  Date of Exam:   08/20/2021 Medical Rec #: [id number]         Accession #:    [id number] Date of Birth: 1956-03-25        Patient Gender: F Patient Age:   66 years Exam Location:  [facility] Procedure:      VAS [place] ABI WITH/WO TBI Referring Phys: ANASTASSIA DOUTOVA --------------------------------------------------------------------------------  Indications: Rest pain, ulceration, gangrene, and peripheral artery disease. High Risk Factors: Hypertension, hyperlipidemia, Diabetes.  Vascular Interventions: Balloon angioplasty of left anterior tibial artery                         05/12/21 at [facility], 5th toe  amputation 06/16/21 at                         [facility],. Limitations: Today's exam was limited due to involuntary patient movement              secondary to ischemia. Comparison Study: No prior study on file at [facility]. There is mention of                   prior PVR done at [facility] as follow up to angioplasty                   indicating non compressible vessels and a right great toe                   pressure of 49 and and absent left great toe pressure 05/2021.                   Surgeon recommended left BKA, but patient declined. Performing Technologist: Sherren Kerns RVS  Examination Guidelines: A complete evaluation includes at minimum, Doppler waveform signals and systolic blood pressure reading at the level of bilateral brachial, anterior tibial, and posterior tibial arteries, when vessel segments are accessible. Bilateral testing is considered an integral part of a complete examination. Photoelectric Plethysmograph (PPG) waveforms and toe systolic pressure readings are included as required and additional duplex testing as needed. Limited examinations for reoccurring indications may be performed as noted.  ABI Findings: +---------+------------------+-----+-----------+--------+ Right    Rt Pressure (mmHg)IndexWaveform   Comment  +---------+------------------+-----+-----------+--------+ Brachial 143                    multiphasic         +---------+------------------+-----+-----------+--------+ PTA      254               1.46 biphasic            +---------+------------------+-----+-----------+--------+  DP       254               1.46 monophasic          +---------+------------------+-----+-----------+--------+ Great Toe39                0.22                     +---------+------------------+-----+-----------+--------+ +---------+------------------+-----+-----------+-------+ Left     Lt Pressure (mmHg)IndexWaveform   Comment  +---------+------------------+-----+-----------+-------+ Brachial 174                    multiphasic        +---------+------------------+-----+-----------+-------+ PTA      254               1.46 monophasic         +---------+------------------+-----+-----------+-------+ DP       130               0.75 monophasic         +---------+------------------+-----+-----------+-------+ Great Toe0                 0.00                    +---------+------------------+-----+-----------+-------+ +-------+-------------+-----------+------------+------------+ ABI/TBIToday's ABI  Today's TBIPrevious ABIPrevious TBI +-------+-------------+-----------+------------+------------+ Right  1.46 non comp0.22       non comp    0.49         +-------+-------------+-----------+------------+------------+ Left   1.46 non compabsent     non comp    absent       +-------+-------------+-----------+------------+------------+ Arterial wall calcification precludes accurate ankle pressures and ABIs. Bilateral ABIs appear essentially unchanged compared to prior study on 05/2021. Right TBIs appear decreased compared to prior study on 05/2021.  Summary: Right: Resting right ankle-brachial index indicates noncompressible right lower extremity arteries. The right toe-brachial index is abnormal. Left: Resting left ankle-brachial index indicates noncompressible left lower extremity arteries. The left toe-brachial index is abnormal. *See table(s) above for measurements and observations.  Electronically signed by Lemar LivingsBrandon Cain MD on 08/20/2021 at 4:23:21 PM.    Final      Labs:   Basic Metabolic Panel: Recent Labs  Lab 08/23/21 0052 08/24/21 0322 08/25/21 0352 08/26/21 0454  NA 137 138 139 136  K 4.1 4.4 4.2 4.4  CL 108 109 107 103  CO2 23 26 27 25   GLUCOSE 188* 191* 144* 126*  BUN 19 16 16 19   CREATININE 0.81 0.96 0.85 0.82  CALCIUM 8.4* 8.6* 8.8* 8.8*   GFR Estimated Creatinine Clearance: 63.4  mL/min (by C-G formula based on SCr of 0.82 mg/dL). Liver Function Tests: Recent Labs  Lab 08/23/21 0052 08/24/21 0322 08/25/21 0352 08/26/21 0454  AST 687* 160* 272* 241*  ALT 309* 172* 189* 164*  ALKPHOS 172* 138* 253* 311*  BILITOT 0.5 0.6 0.4 0.8  PROT 5.6* 5.6* 6.1* 5.9*  ALBUMIN 2.8* 2.7* 2.9* 2.8*   No results for input(s): LIPASE, AMYLASE in the last 168 hours. No results for input(s): AMMONIA in the last 168 hours. Coagulation profile No results for input(s): INR, PROTIME in the last 168 hours.  CBC: Recent Labs  Lab 08/24/21 0322 08/25/21 0352 08/26/21 0454  WBC 9.4 8.8 10.5  NEUTROABS 4.7 3.8 5.6  HGB 9.2* 9.4* 9.1*  HCT 30.2* 31.0* 29.4*  MCV 89.1 90.1 89.1  PLT 310 311 314   Cardiac Enzymes: No results for input(s): CKTOTAL, CKMB, CKMBINDEX, TROPONINI in the  last 168 hours. BNP: Invalid input(s): POCBNP CBG: Recent Labs  Lab 08/26/21 1234 08/26/21 1615 08/26/21 2111 08/27/21 0618 08/27/21 1038  GLUCAP 117* 205* 201* 212* 86   D-Dimer No results for input(s): DDIMER in the last 72 hours. Hgb A1c No results for input(s): HGBA1C in the last 72 hours. Lipid Profile No results for input(s): CHOL, HDL, LDLCALC, TRIG, CHOLHDL, LDLDIRECT in the last 72 hours. Thyroid function studies No results for input(s): TSH, T4TOTAL, T3FREE, THYROIDAB in the last 72 hours.  Invalid input(s): FREET3 Anemia work up No results for input(s): VITAMINB12, FOLATE, FERRITIN, TIBC, IRON, RETICCTPCT in the last 72 hours. Microbiology Recent Results (from the past 240 hour(s))  Blood Cultures x 2 sites     Status: None   Collection Time: 08/19/21  1:49 PM   Specimen: BLOOD RIGHT HAND  Result Value Ref Range Status   Specimen Description BLOOD RIGHT HAND  Final   Special Requests   Final    BOTTLES DRAWN AEROBIC AND ANAEROBIC Blood Culture results may not be optimal due to an inadequate volume of blood received in culture bottles   Culture   Final    NO GROWTH 5  DAYS Performed at [facility], [address] [place], [place] [address]    Report Status 08/24/2021 FINAL  Final  Blood Cultures x 2 sites     Status: None   Collection Time: 08/19/21  1:54 PM   Specimen: BLOOD LEFT HAND  Result Value Ref Range Status   Specimen Description BLOOD LEFT HAND  Final   Special Requests   Final    BOTTLES DRAWN AEROBIC AND ANAEROBIC Blood Culture results may not be optimal due to an inadequate volume of blood received in culture bottles   Culture   Final    NO GROWTH 5 DAYS Performed at [facility], [address] [place], [place] [address]    Report Status 08/24/2021 FINAL  Final  MRSA Next Gen by PCR, Nasal     Status: None   Collection Time: 08/19/21 10:59 PM   Specimen: Urine, Clean Catch; Nasal Swab  Result Value Ref Range Status   MRSA by PCR Next Gen NOT DETECTED NOT DETECTED Final    Comment: (NOTE) The GeneXpert MRSA Assay (FDA approved for NASAL specimens only), is one component of a comprehensive MRSA colonization surveillance program. It is not intended to diagnose MRSA infection nor to guide or monitor treatment for MRSA infections. Test performance is not FDA approved in patients less than 2 years old. Performed at [facility], [address] [place], [place] [address]     Time coordinating discharge: 35 minutes  Signed: Jermari Tamargo  [facility] 08/27/2021, 12:37 PM

## 2021-08-27 NOTE — Progress Notes (Signed)
Pt placed on CPAP for the night at preveoius settings, tol well
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
## 2021-08-27 NOTE — Plan of Care (Signed)
  Problem: Health Behavior/Discharge Planning: Goal: Ability to manage health-related needs will improve Outcome: Adequate for Discharge   

## 2021-08-27 NOTE — TOC Transition Note (Addendum)
Transition of Care [facility]) - CM/SW Discharge Note   Patient Details  Name: PENNIE VANBLARCOM MRN: [id number] Date of Birth: 1956/02/26  [facility] CM/SW Contact:  Lockie Pares, RN Phone Number: 08/27/2021, 10:49 AM   Clinical Narrative:     Patient will likely DC home today. [facility] is setting up Home Health to start Monday for Rn and seeing their PT OT team. Patient lives with Kirt Boys, granddaughter. She has her wheelchair in the room.  Called the patient, she is weepy and lonely frustrated, feeling pain. . Discussed transportation. One of her granddaughters will pick her up when DC. Rep Annice Pih from [place] was in the room, spoke to her concerning DC planning.  Patient may DC today, awaiting MD rounds Final next level of care: Home w Home Health Services Barriers to Discharge: No Barriers Identified   Patient Goals and CMS Choice Patient states their goals for this hospitalization and ongoing recovery are:: To return home CMS Medicare.gov Compare Post Acute Care list provided to:: Patient Choice offered to / list presented to : Patient  Discharge Placement                       Discharge Plan and Services   Discharge Planning Services: CM Consult Post Acute Care Choice: Home Health          DME Arranged: N/A DME Agency: NA       HH Arranged: PT, OT, RN, Disease Management HH Agency: Other - See comment ([facility]) Date HH Agency Contacted: 08/21/21   Representative spoke with at [facility] Agency: Valarie Cones, SW  Social Determinants of Health (SDOH) Interventions     Readmission Risk Interventions     View : No data to display.

## 2021-08-27 NOTE — Progress Notes (Signed)
Subjective: POD # 3 s/p left partial fifth metatarsal amputation with wound closure.  She states that she is feeling better today.  Denies any fevers or chills.  Pain is better controlled.  She has no new concerns.    Objective: AAO x3, NAD Incision coapted with sutures, staples intact.  There are some mild ST changes still present but overall appears to be improving clinically.  There is no drainage or pus.  No fluctuation crepitation.  There is no malodor.  Small necrotic area present on the medial aspect of the hallux which is superficial in drainage or pus.  No fluctuation or crepitation.  No malodor.           Assessment: POD #3 s/p left fifth metatarsal partial resection, wound excision, closure  Plan: Incision appears to be healing well at this time.  Overall she is feeling better.  She weight-bear as tolerated in surgical shoe although he is limited in a weightbearing keep the foot elevated.  Recommend discharge home with a week of oral antibiotics, Augmentin.  I will follow-up with her in the office in about a week.  Return precautions discussed.  Celesta Gentile, DPM

## 2021-08-29 ENCOUNTER — Telehealth: Payer: Self-pay

## 2021-08-29 ENCOUNTER — Ambulatory Visit (HOSPITAL_BASED_OUTPATIENT_CLINIC_OR_DEPARTMENT_OTHER): Payer: Medicare (Managed Care) | Admitting: Internal Medicine

## 2021-09-08 ENCOUNTER — Ambulatory Visit (INDEPENDENT_AMBULATORY_CARE_PROVIDER_SITE_OTHER): Payer: Medicare (Managed Care)

## 2021-09-08 ENCOUNTER — Ambulatory Visit (INDEPENDENT_AMBULATORY_CARE_PROVIDER_SITE_OTHER): Payer: Medicare (Managed Care) | Admitting: Podiatry

## 2021-09-08 DIAGNOSIS — L97521 Non-pressure chronic ulcer of other part of left foot limited to breakdown of skin: Secondary | ICD-10-CM | POA: Diagnosis not present

## 2021-09-08 DIAGNOSIS — I739 Peripheral vascular disease, unspecified: Secondary | ICD-10-CM

## 2021-09-08 DIAGNOSIS — M79672 Pain in left foot: Secondary | ICD-10-CM

## 2021-09-11 ENCOUNTER — Telehealth: Payer: Self-pay | Admitting: Podiatry

## 2021-09-11 NOTE — Telephone Encounter (Signed)
Called patient to get her in today, she called the on call provider this weekend and she stated she has some stitches removed and that the incision has opened back up. I did offer her an appointment today but she doesn't have transportation until tomorrow. She has been added to Dr. Lilian Kapur schedule .

## 2021-09-11 NOTE — Progress Notes (Signed)
Subjective: Anna [name] is a 66 y.o. is seen today in office s/p left foot fifth metatarsal head resection preformed on 08/24/2021.  States that she is doing better and pain is better controlled and she is taking oral pain medication.  She states been better spirits today.  Denies any fevers or chills.   Objective: General: No acute distress, AAOx3  LEFT foot: Incision is well coapted proximally with sutures, staples intact.  There is an area of scabbing, eschar distally along the incision.  I did remove the sutures today as they were getting buried in the skin and also coming loose.  I was able to debride some of the eschar today distally.  There is no drainage or pus.  No surrounding erythema, ascending cellulitis.  No fluctuance or crepitation.  There is no malodor. No pain with calf compression, swelling, warmth, erythema.   Assessment and Plan:  Status post Left foot fifth metatarsal head resection, doing well with no complications   -Treatment options discussed including all alternatives, risks, and complications -Remove the sutures today both staples intact.  I was able to debride some of the loose scab today.  Areas cleaned with saline.  Betadine was applied followed by dry sterile dressing.  Continue with dressing changes with Betadine for now and to keep area clean and dry.  Continue offloading shoe, elevation. -States that she still gets pain in her foot she has pain in her foot on the side of the bed in order to help with the pain.  Follow-up with vascular surgery as well.   Vivi Barrack DPM

## 2021-09-12 ENCOUNTER — Ambulatory Visit (INDEPENDENT_AMBULATORY_CARE_PROVIDER_SITE_OTHER): Payer: Medicare (Managed Care) | Admitting: Podiatry

## 2021-09-12 DIAGNOSIS — Z91199 Patient's noncompliance with other medical treatment and regimen due to unspecified reason: Secondary | ICD-10-CM

## 2021-09-20 ENCOUNTER — Emergency Department (HOSPITAL_COMMUNITY): Payer: Medicare (Managed Care)

## 2021-09-20 ENCOUNTER — Emergency Department (HOSPITAL_COMMUNITY)
Admission: EM | Admit: 2021-09-20 | Discharge: 2021-09-20 | Disposition: A | Discharge status: Home / Self Care | Payer: Medicare (Managed Care) | Attending: Emergency Medicine | Admitting: Emergency Medicine

## 2021-09-20 ENCOUNTER — Encounter (HOSPITAL_COMMUNITY): Payer: Self-pay

## 2021-09-20 ENCOUNTER — Other Ambulatory Visit: Payer: Self-pay

## 2021-09-20 ENCOUNTER — Telehealth: Payer: Self-pay | Admitting: Podiatry

## 2021-09-20 ENCOUNTER — Other Ambulatory Visit: Payer: Self-pay | Admitting: Podiatry

## 2021-09-20 DIAGNOSIS — E11319 Type 2 diabetes mellitus with unspecified diabetic retinopathy without macular edema: Secondary | ICD-10-CM | POA: Diagnosis not present

## 2021-09-20 DIAGNOSIS — Z79899 Other long term (current) drug therapy: Secondary | ICD-10-CM | POA: Insufficient documentation

## 2021-09-20 DIAGNOSIS — J45909 Unspecified asthma, uncomplicated: Secondary | ICD-10-CM | POA: Insufficient documentation

## 2021-09-20 DIAGNOSIS — Z794 Long term (current) use of insulin: Secondary | ICD-10-CM | POA: Diagnosis not present

## 2021-09-20 DIAGNOSIS — I10 Essential (primary) hypertension: Secondary | ICD-10-CM | POA: Diagnosis not present

## 2021-09-20 DIAGNOSIS — I739 Peripheral vascular disease, unspecified: Secondary | ICD-10-CM

## 2021-09-20 DIAGNOSIS — M79672 Pain in left foot: Secondary | ICD-10-CM | POA: Diagnosis present

## 2021-09-20 DIAGNOSIS — E1140 Type 2 diabetes mellitus with diabetic neuropathy, unspecified: Secondary | ICD-10-CM | POA: Diagnosis not present

## 2021-09-20 DIAGNOSIS — Z91040 Latex allergy status: Secondary | ICD-10-CM | POA: Insufficient documentation

## 2021-09-20 DIAGNOSIS — Z7982 Long term (current) use of aspirin: Secondary | ICD-10-CM | POA: Insufficient documentation

## 2021-09-20 DIAGNOSIS — L97521 Non-pressure chronic ulcer of other part of left foot limited to breakdown of skin: Secondary | ICD-10-CM

## 2021-09-20 LAB — CBC WITH DIFFERENTIAL/PLATELET
Abs Immature Granulocytes: 0.03 10*3/uL (ref 0.00–0.07)
Basophils Absolute: 0.1 10*3/uL (ref 0.0–0.1)
Basophils Relative: 1 %
Eosinophils Absolute: 0.5 10*3/uL (ref 0.0–0.5)
Eosinophils Relative: 4 %
HCT: 35.5 % — ABNORMAL LOW (ref 36.0–46.0)
Hemoglobin: 10.9 g/dL — ABNORMAL LOW (ref 12.0–15.0)
Immature Granulocytes: 0 %
Lymphocytes Relative: 35 %
Lymphs Abs: 3.6 10*3/uL (ref 0.7–4.0)
MCH: 27 pg (ref 26.0–34.0)
MCHC: 30.7 g/dL (ref 30.0–36.0)
MCV: 88.1 fL (ref 80.0–100.0)
Monocytes Absolute: 0.9 10*3/uL (ref 0.1–1.0)
Monocytes Relative: 8 %
Neutro Abs: 5.4 10*3/uL (ref 1.7–7.7)
Neutrophils Relative %: 52 %
Platelets: 343 10*3/uL (ref 150–400)
RBC: 4.03 MIL/uL (ref 3.87–5.11)
RDW: 14 % (ref 11.5–15.5)
WBC: 10.4 10*3/uL (ref 4.0–10.5)
nRBC: 0 % (ref 0.0–0.2)

## 2021-09-20 LAB — BASIC METABOLIC PANEL
Anion gap: 8 (ref 5–15)
BUN: 18 mg/dL (ref 8–23)
CO2: 25 mmol/L (ref 22–32)
Calcium: 9.1 mg/dL (ref 8.9–10.3)
Chloride: 105 mmol/L (ref 98–111)
Creatinine, Ser: 0.89 mg/dL (ref 0.44–1.00)
GFR, Estimated: >60 mL/min (ref >60)
Glucose, Bld: 163 mg/dL — ABNORMAL HIGH (ref 70–99)
Potassium: 4.4 mmol/L (ref 3.5–5.1)
Sodium: 138 mmol/L (ref 135–145)

## 2021-09-20 LAB — LACTIC ACID, PLASMA: Lactic Acid, Venous: 1 mmol/L (ref 0.5–1.9)

## 2021-09-20 MED ORDER — KETOROLAC TROMETHAMINE 30 MG/ML IJ SOLN
30.0000 mg | Freq: Once | INTRAMUSCULAR | Status: AC
Start: 2021-09-20 — End: 2021-09-20
  Administered 2021-09-20: 30 mg via INTRAMUSCULAR
  Filled 2021-09-20: qty 1

## 2021-09-20 MED ORDER — HYDROCODONE-ACETAMINOPHEN 5-325 MG PO TABS
1.0000 | ORAL_TABLET | Freq: Once | ORAL | Status: AC
  Administered 2021-09-20: 1 via ORAL
  Filled 2021-09-20: qty 1

## 2021-09-20 NOTE — Discharge Instructions (Signed)
Expect call from [facility] to set up follow up appointment in the next day or two.   It was a pleasure caring for you today in the emergency department.  Please return to the emergency department for any worsening or worrisome symptoms.

## 2021-09-20 NOTE — ED Triage Notes (Signed)
Pt BIB EMS from home. Pt had amputation in March. Pt now has necrotic tissue and possible wound infection.   158/88 HR 79 RR 18 98% RA

## 2021-09-20 NOTE — ED Provider Triage Note (Signed)
Emergency Medicine Provider Triage Evaluation Note  POET HINEMAN , a 66 y.o. female  was evaluated in triage.  Pt complains of left foot pain and swelling.  She is status post small toe amputation.  States for the past 4 days the symptoms have been worsening.  Denies fever.  Denies drainage from the surgical wound..  Review of Systems  Positive: As above Negative: as above  Physical Exam  BP (!) 163/71   Pulse 79   Temp 98.9 F (37.2 C) (Oral)   Resp 18   SpO2 98%  Gen:   Awake, no distress   Resp:  Normal effort  MSK:   Moves extremities without difficulty  Other:    Medical Decision Making  Medically screening exam initiated at 3:30 PM.  Appropriate orders placed.  LACOLE KOMOROWSKI was informed that the remainder of the evaluation will be completed by another provider, this initial triage assessment does not replace that evaluation, and the importance of remaining in the ED until their evaluation is complete.     Marita Kansas, PA-C 09/20/21 1531

## 2021-09-20 NOTE — ED Provider Notes (Signed)
[facility]-EMERGENCY DEPT Provider Note   CSN: [id number] Arrival date & time: 09/20/21  1508     History  Chief Complaint  Patient presents with   Wound Infection    Anna [name] is a 66 y.o. female.  Patient as above with significant medical history as below, including diabetic neuropathy, hypertension, hyperlipidemia who presents to the ED with complaint of left foot pain.  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 Ardelle Anton.  She did not show for her last office visit with podiatry 6/20, last saw them in office 6/16.  Patient ports worsening pain to her left foot over the past week.  She has been taking her home analgesics with mild improvement to her symptoms.  Last time she took a Percocet was around 2 PM today.  No improvement to her pain.  She was given fentanyl by EMS 1 point which did give her drastic relief of her pain, since then she has been noticing her pain more frequently.  No fevers or chills, no nausea or vomiting.  No drainage from the wound.  No odor to the wound.     Past Medical History:  Diagnosis Date   Arthritis    Asthma    Back pain    Bronchitis    Bursitis    Diabetes mellitus    Diabetic neuropathy (HCC)    Diabetic retinopathy    Hyperlipemia    Hypertension    Migraine    Obesity    Sepsis due to urinary tract infection [facility])     Past Surgical History:  Procedure Laterality Date   ABDOMINAL AORTOGRAM W/LOWER EXTREMITY Left 08/22/2021   Procedure: ABDOMINAL AORTOGRAM W/LOWER EXTREMITY;  Surgeon: Nada Libman, MD;  Location: [facility] INVASIVE CV LAB;  Service: Cardiovascular;  Laterality: Left;   CESAREAN SECTION     x3   CHOLECYSTECTOMY     EYE SURGERY Left    METATARSAL HEAD EXCISION Left 08/24/2021   Procedure: METATARSAL HEAD EXCISION FIFTH TOE;  Surgeon: Vivi Barrack, DPM;  Location: [facility] OR;  Service: Podiatry;  Laterality:  Left;   PERIPHERAL VASCULAR BALLOON ANGIOPLASTY  08/22/2021   Procedure: PERIPHERAL VASCULAR BALLOON ANGIOPLASTY;  Surgeon: Nada Libman, MD;  Location: [facility] INV[facility] CV [facility];  Service: Cardiovascular;;  Left AT   WOUND DEBRIDEMENT Left 08/24/2021   Procedure: DEBRIDEMENT WOUND OF LEFT FOOT;  Surgeon: Vivi Barrack, DPM;  Location: [facility] OR;  Service: Podiatry;  Laterality: Left;     The history is provided by the patient. No language interpreter was used.       Home Medications Prior to Admission medications   Medication Sig Start Date End Date Taking? Authorizing Provider  acetaminophen (TYLENOL) 500 MG tablet Take 1,000 mg by mouth every 6 (six) hours as needed for moderate pain.    [provider]  albuterol (VENTOLIN HFA) 108 (90 Base) MCG/ACT inhaler Inhale 1-2 puffs into the lungs every 6 (six) hours as needed for wheezing or shortness of breath.    [provider]  ALPRAZolam Prudy Feeler) 1 MG tablet Take 1 mg by mouth 3 (three) times daily as needed for anxiety.    [provider]  aspirin EC 81 MG tablet Take 1 tablet (81 mg total) by mouth daily. Swallow whole. 08/28/21 11/26/21  Lorin Glass, MD  Baclofen 5 MG TABS Take 5 mg by mouth 3 (three) times daily  as needed (muscle spasm).    [provider]  busPIRone (BUSPAR) 10 MG tablet Take 25 mg by mouth 2 (two) times daily.    [provider]  clopidogrel (PLAVIX) 75 MG tablet Take 75 mg by mouth daily.    [provider]  diclofenac Sodium (VOLTAREN) 1 % GEL Apply 4 g topically 4 (four) times daily as needed (pain).    [provider]  docusate sodium (COLACE) 100 MG capsule Take 100 mg by mouth 2 (two) times daily as needed for mild constipation.    [provider]  Dulaglutide (TRULICITY) 1.5 MG/0.5ML SOPN Inject 3 mg into the skin once a week. Fridays    [provider]  empagliflozin (JARDIANCE) 10 MG TABS tablet Take 10 mg by mouth daily.    [provider]  esomeprazole (NEXIUM) 40 MG capsule Take 40 mg by mouth 2 (two) times daily before a meal.    [provider]  estradiol (ESTRACE) 0.1 MG/GM vaginal cream Place 1 Applicatorful vaginally See admin instructions. Three times a week    [provider]  famotidine (PEPCID) 20 MG tablet Take 20 mg by mouth 2 (two) times daily.    [provider]  feeding supplement (ENSURE ENLIVE / ENSURE PLUS) LIQD Take 237 mLs by mouth 2 (two) times daily between meals. 08/27/21   Lorin Glass, MD  FLUoxetine (PROZAC) 40 MG capsule Take 80 mg by mouth daily.    [provider]  fluticasone (FLONASE) 50 MCG/ACT nasal spray Place 1 spray into both nostrils daily as needed for allergies or rhinitis.    [provider]  gabapentin (NEURONTIN) 300 MG capsule Take 300 mg by mouth 3 (three) times daily.    [provider]  guaiFENesin (MUCINEX) 600 MG 12 hr tablet Take 600 mg by mouth daily as needed for cough.    [provider]  hydrocortisone (ANUSOL-HC) 25 MG suppository Place 25 mg rectally 3 (three) times daily as needed for hemorrhoids or anal itching.    [provider]  hydrOXYzine (ATARAX) 10 MG tablet Take 10 mg by mouth 2 (two) times daily.    [provider]  ibuprofen (ADVIL) 200 MG tablet Take 600 mg by mouth every 8 (eight) hours as needed for moderate pain.    [provider]  insulin glargine, 1 Unit Dial, (TOUJEO SOLOSTAR) 300 UNIT/ML Solostar Pen Inject 20 Units into the skin at bedtime.    [provider]  insulin lispro (HUMALOG) 100 UNIT/ML KwikPen Inject 0-9 Units into the skin 3 (three) times daily. Sliding scale: less than 150 -0 units 150-200 - 2 units 201-250 - 3 units 251-300 - 4 units 301-350 - 5 units 351-400 - 6 units 401-450 - 7 units 451-500 - 8 units 501-550- 9 units Greater than 550 notify provider.    [provider]  ipratropium-albuterol (DUONEB) 0.5-2.5 (3)  MG/3ML SOLN Take 3 mLs by nebulization every 6 (six) hours as needed (wheezing).    [provider]  Lactobacillus Rhamnosus, GG, (CULTURELLE HEALTH & WELLNESS) CAPS Take 1 capsule by mouth daily. 08/05/21   [provider]  lidocaine 4 % Place 1 patch onto the skin daily as needed (pain).    [provider]  lisinopril (ZESTRIL) 5 MG tablet Take 5 mg by mouth daily.    [provider]  loperamide (IMODIUM) 2 MG capsule Take 2 mg by mouth daily as needed for diarrhea or loose stools.    [provider]  loratadine (CLARITIN) 10 MG tablet Take 10 mg by mouth daily as needed for allergies.    [provider]  meclizine (ANTIVERT) 25 MG tablet Take 25 mg by mouth 4 (four) times daily as needed for dizziness.    [provider]  metFORMIN (GLUCOPHAGE) 1000 MG tablet Take 1,000 mg by mouth 2 (two) times daily with a meal.    [provider]  Multiple Vitamin (MULTIVITAMIN WITH MINERALS) TABS tablet Take 1 tablet by mouth daily.    [provider]  nystatin cream (MYCOSTATIN) Apply 1 application. topically 4 (four) times daily as needed for dry skin.    [provider]  ondansetron (ZOFRAN-ODT) 4 MG disintegrating tablet Take 4 mg by mouth every 8 (eight) hours as needed for nausea or vomiting.    [provider]  Phenylephrine-Acetaminophen (TYLENOL SINUS+HEADACHE) 5-325 MG TABS Take 2 tablets by mouth daily as needed (headache).    [provider]  polyethylene glycol (MIRALAX / GLYCOLAX) 17 g packet Take 17 g by mouth daily as needed for moderate constipation.    [provider]  potassium chloride SA (KLOR-CON M) 20 MEQ tablet Take 20 mEq by mouth daily.    [provider]  promethazine (PHENERGAN) 12.5 MG tablet Take 1 tablet (12.5 mg total) by mouth every 6 (six) hours as needed for nausea or vomiting. Patient not taking: Reported on 08/19/2021 06/25/14   Richarda OverlieAbrol, Nayana, MD   rOPINIRole (REQUIP) 0.25 MG tablet Take 0.25 mg by mouth at bedtime.    [provider]  rosuvastatin (CRESTOR) 20 MG tablet Take 20 mg by mouth daily.    [provider]  senna (SENOKOT) 8.6 MG TABS tablet Take 2 tablets by mouth daily as needed for mild constipation.    [provider]  sucralfate (CARAFATE) 1 GM/10ML suspension Take 1 g by mouth 4 (four) times daily as needed (stomch ulcer).    [provider]  topiramate (TOPAMAX) 25 MG tablet Take 25 mg by mouth daily.    [provider]  traZODone (DESYREL) 50 MG tablet Take 25-50 mg by mouth at bedtime as needed for sleep.    [provider]      Allergies    Bacitracin, Citalopram, Doxycycline, Morphine and related, Neomycin, Ciprofloxacin, Ciprofloxacin-dexamethasone, Duloxetine, Latex, Levaquin [levofloxacin in d5w], Piper, Salvia officinalis, Soma [carisoprodol], and Lasix [furosemide]    Review of Systems   Review of Systems  Constitutional:  Negative for chills and fever.  HENT:  Negative for facial swelling and trouble swallowing.   Eyes:  Negative for photophobia and visual disturbance.  Respiratory:  Negative for cough and shortness of breath.   Cardiovascular:  Negative for chest pain and palpitations.  Gastrointestinal:  Negative for abdominal pain, nausea and vomiting.  Endocrine: Negative for polydipsia and polyuria.  Genitourinary:  Negative for difficulty urinating and hematuria.  Musculoskeletal:  Positive for arthralgias. Negative for gait problem and joint swelling.  Skin:  Positive for wound. Negative for pallor and rash.  Neurological:  Negative for syncope and headaches.  Psychiatric/Behavioral:  Negative for agitation and confusion.     Physical Exam Updated Vital Signs BP (!) 146/73   Pulse 62   Temp 98.9 F (37.2 C) (Oral)   Resp 16   SpO2 97%  Physical Exam Vitals and nursing note reviewed.  Constitutional:      General: She is not in acute  distress.    Appearance: Normal appearance.  HENT:  Head: Normocephalic and atraumatic.     Right Ear: External ear normal.     Left Ear: External ear normal.     Nose: Nose normal.     Mouth/Throat:     Mouth: Mucous membranes are moist.  Eyes:     General: No scleral icterus.       Right eye: No discharge.        Left eye: No discharge.  Cardiovascular:     Rate and Rhythm: Normal rate and regular rhythm.     Pulses: Normal pulses.     Heart sounds: Normal heart sounds.  Pulmonary:     Effort: Pulmonary effort is normal. No respiratory distress.     Breath sounds: Normal breath sounds.  Abdominal:     General: Abdomen is flat.     Tenderness: There is no abdominal tenderness.  Musculoskeletal:        General: Normal range of motion.     Cervical back: Normal range of motion.     Right lower leg: No edema.     Left lower leg: No edema.  Feet:     Comments: Status post partial amputation left foot, see photos No purulent drainage there is a large scab/eschar No erythema or streaking DP pulses equal bilateral Multiple wounds to left foot Skin:    General: Skin is warm and dry.     Capillary Refill: Capillary refill takes less than 2 seconds.  Neurological:     Mental Status: She is alert.  Psychiatric:        Mood and Affect: Mood normal.        Behavior: Behavior normal.          ED Results / Procedures / Treatments   Labs (all labs ordered are listed, but only abnormal results are displayed) Labs Reviewed  CBC WITH DIFFERENTIAL/PLATELET - Abnormal; Notable for the following components:      Result Value   Hemoglobin 10.9 (*)    HCT 35.5 (*)    All other components within normal limits  BASIC METABOLIC PANEL - Abnormal; Notable for the following components:   Glucose, Bld 163 (*)    All other components within normal limits  LACTIC ACID, PLASMA    EKG None  Radiology DG Foot Complete Left  Result Date: 09/20/2021 CLINICAL DATA:  Foot pain and  swelling EXAM: LEFT FOOT - COMPLETE 3+ VIEW COMPARISON:  09/08/2021, 08/24/2021, 08/19/2021 FINDINGS: Chronic fracture at the base of the fifth metatarsal. Patient is status post transmetatarsal amputation fifth digit with smooth cut margin. No definite erosive change. Vascular calcifications. No soft tissue emphysema. IMPRESSION: Status post transmetatarsal amputation fifth digit without definitive acute osseous abnormality. Chronic fracture deformity base of fifth metatarsal Electronically Signed   By: Jasmine Pang M.D.   On: 09/20/2021 16:00    Procedures Procedures    Medications Ordered in ED Medications  ketorolac (TORADOL) 30 MG/ML injection 30 mg (30 mg Intramuscular Given 09/20/21 2000)  HYDROcodone-acetaminophen (NORCO/VICODIN) 5-325 MG per tablet 1 tablet (1 tablet Oral Given 09/20/21 2000)  HYDROcodone-acetaminophen (NORCO/VICODIN) 5-325 MG per tablet 1 tablet (1 tablet Oral Given 09/20/21 2205)    ED Course/ Medical Decision Making/ A&P                           Medical Decision Making Amount and/or Complexity of Data Reviewed Labs: ordered.  Risk Prescription drug management.    CC: Foot pain  This patient presents  to the Emergency Department for the above complaint. This involves an extensive number of treatment options and is a complaint that carries with it a high risk of complications and morbidity. Vital signs were reviewed. Serious etiologies considered.  DDx includes not limited to superficial infection, soft tissue infection, osteomyelitis, sprain, strain, MSK, psychogenic, other acute etiologies were considered  Record review:  Previous records obtained and reviewed  Prior office visit, prior ED visits, prior labs and imaging, home medications  Additional history obtained from N/A  Medical and surgical history as noted above.   Work up as above, notable for:  Labs & imaging results that were available during my care of the patient were visualized by me and  considered in my medical decision making.   I ordered imaging studies which included x-ray left foot. I visualized the imaging, interpreted images, and I agree with radiologist interpretation.  Chronic pharmacy, no acute changes, no evidence of osteomyelitis  Labs reviewed and are stable.  No evidence of sepsis. No leukocytosis, she is afebrile.   Personally discussed patient care with consultant; Dr Ardelle Anton, he has viewed wound photos, does not favor infection.  Pain likely secondary to ongoing ischemic changes.  Vascular did recommend BKA which patient is hesitant regarding this.  Management: Analgesics Will d/w podiatry  ED Course:     Reassessment:  Symptoms improved.  Will adjust patient's home pain regimen.  Podiatry will call her tomorrow to help set up an appointment.  Patient feeling much better.  Stable for discharge.  Admission was considered.   Low suspicion for acute infection to the patient's foot.  The patient improved significantly and was discharged in stable condition. Detailed discussions were had with the patient regarding current findings, and need for close f/u with PCP or on call doctor. The patient has been instructed to return immediately if the symptoms worsen in any way for re-evaluation. Patient verbalized understanding and is in agreement with current care plan. All questions answered prior to discharge.              Social determinants of health include -  Social History   Socioeconomic History   Marital status: Divorced    Spouse name: Not on file   Number of children: Not on file   Years of education: Not on file   Highest education level: Not on file  Occupational History   Not on file  Tobacco Use   Smoking status: Never   Smokeless tobacco: Never  Substance and Sexual Activity   Alcohol use: No   Drug use: No   Sexual activity: Not on file  Other Topics Concern   Not on file  Social History Narrative   Not on file    Social Determinants of Health   Financial Resource Strain: Not on file  Food Insecurity: Not on file  Transportation Needs: Not on file  Physical Activity: Not on file  Stress: Not on file  Social Connections: Not on file  Intimate Partner Violence: Not on file      This chart was dictated using voice recognition software.  Despite best efforts to proofread,  errors can occur which can change the documentation meaning. `        Final Clinical Impression(s) / ED Diagnoses Final diagnoses:  Left foot pain    Rx / DC Orders ED Discharge Orders     None         Sloan Leiter, DO 09/20/21 2331

## 2021-09-20 NOTE — Telephone Encounter (Signed)
Received call from [facility] long ER. Patient is there with pain. X-ray shows no acute osseous abnormality. WBC is normal as well as lactic acid. Pictures reviewed. Eschar present but does not look acutely infected but just not healing. Will need vascular and wound care follow up. She apparently was not able to come in for her last appointment due to transportation.   If ER sends home will need a follow up. Carney Bern, can you please call to see if we can get her in sooner? I am also going to place a wound care referral.   Thanks.

## 2021-09-21 ENCOUNTER — Ambulatory Visit (INDEPENDENT_AMBULATORY_CARE_PROVIDER_SITE_OTHER): Payer: Medicare (Managed Care) | Admitting: Podiatry

## 2021-09-21 DIAGNOSIS — L97528 Non-pressure chronic ulcer of other part of left foot with other specified severity: Secondary | ICD-10-CM

## 2021-09-21 DIAGNOSIS — L97521 Non-pressure chronic ulcer of other part of left foot limited to breakdown of skin: Secondary | ICD-10-CM

## 2021-09-22 ENCOUNTER — Other Ambulatory Visit: Payer: Self-pay | Admitting: Podiatry

## 2021-09-22 DIAGNOSIS — L97521 Non-pressure chronic ulcer of other part of left foot limited to breakdown of skin: Secondary | ICD-10-CM

## 2021-09-24 NOTE — Progress Notes (Signed)
Subjective: Anna [name] is a 66 y.o. is seen today in office s/p left foot fifth metatarsal head resection preformed on 08/24/2021.  She was just seen emergency room given increased pain.  She was discharged and presents today for follow-up.  She states her pain is better controlled today.  No drainage or pus.  No fevers or chills.   Objective: General: No acute distress, AAOx3  LEFT foot: Incision is well coapted proximally with staples intact.  On the distal, plantar aspect is an eschar noted.  Incision does not appear to be healing well.  There is no surrounding erythema, ascending cellulitis.  No fluctuance or crepitation but there is no malodor.  Mild edema.  There is also eschar is noted on the hallux as well as dorsal foot.  They are stable.  No swelling redness or drainage. No pain with calf compression, swelling, warmth, erythema.    Assessment and Plan:  Status post Left foot fifth metatarsal head resection, nonhealing  -Treatment options discussed including all alternatives, risks, and complications -X-rays reviewed from emergency room.  No evidence of acute fracture, osteomyelitis. -At this point the wound is not healing well.  In the attempt to try to salvage the limb will refer to with the wound care center.  I removed the staples today.  Continue Betadine dressing changes for now until she can go to the wound care center.  We will try to arrange home health care. -Follow-up with vascular surgery.  Vivi Barrack DPM

## 2021-09-27 NOTE — Telephone Encounter (Signed)
Appt scheduled for 11/06/21- klg

## 2021-09-28 ENCOUNTER — Ambulatory Visit (INDEPENDENT_AMBULATORY_CARE_PROVIDER_SITE_OTHER): Payer: Medicare (Managed Care) | Admitting: Orthopedic Surgery

## 2021-09-28 DIAGNOSIS — I70262 Atherosclerosis of native arteries of extremities with gangrene, left leg: Secondary | ICD-10-CM | POA: Diagnosis not present

## 2021-10-05 ENCOUNTER — Inpatient Hospital Stay (HOSPITAL_COMMUNITY)
Admission: EM | Admit: 2021-10-05 | Discharge: 2021-10-13 | DRG: 240 | Disposition: A | Discharge status: Skilled Nursing Facility | Payer: Medicare (Managed Care) | Attending: Family Medicine | Admitting: Family Medicine

## 2021-10-05 ENCOUNTER — Encounter (HOSPITAL_COMMUNITY): Payer: Self-pay | Admitting: Emergency Medicine

## 2021-10-05 ENCOUNTER — Telehealth: Payer: Self-pay

## 2021-10-05 DIAGNOSIS — Z881 Allergy status to other antibiotic agents status: Secondary | ICD-10-CM

## 2021-10-05 DIAGNOSIS — F419 Anxiety disorder, unspecified: Secondary | ICD-10-CM | POA: Diagnosis present

## 2021-10-05 DIAGNOSIS — I70262 Atherosclerosis of native arteries of extremities with gangrene, left leg: Secondary | ICD-10-CM | POA: Diagnosis present

## 2021-10-05 DIAGNOSIS — F39 Unspecified mood [affective] disorder: Secondary | ICD-10-CM | POA: Diagnosis present

## 2021-10-05 DIAGNOSIS — Z7902 Long term (current) use of antithrombotics/antiplatelets: Secondary | ICD-10-CM

## 2021-10-05 DIAGNOSIS — Z79818 Long term (current) use of other agents affecting estrogen receptors and estrogen levels: Secondary | ICD-10-CM

## 2021-10-05 DIAGNOSIS — Z79899 Other long term (current) drug therapy: Secondary | ICD-10-CM

## 2021-10-05 DIAGNOSIS — J449 Chronic obstructive pulmonary disease, unspecified: Secondary | ICD-10-CM | POA: Diagnosis present

## 2021-10-05 DIAGNOSIS — E785 Hyperlipidemia, unspecified: Secondary | ICD-10-CM | POA: Diagnosis present

## 2021-10-05 DIAGNOSIS — E11319 Type 2 diabetes mellitus with unspecified diabetic retinopathy without macular edema: Secondary | ICD-10-CM | POA: Diagnosis present

## 2021-10-05 DIAGNOSIS — N179 Acute kidney failure, unspecified: Secondary | ICD-10-CM | POA: Diagnosis present

## 2021-10-05 DIAGNOSIS — Z7985 Long-term (current) use of injectable non-insulin antidiabetic drugs: Secondary | ICD-10-CM

## 2021-10-05 DIAGNOSIS — E7800 Pure hypercholesterolemia, unspecified: Secondary | ICD-10-CM | POA: Diagnosis present

## 2021-10-05 DIAGNOSIS — I10 Essential (primary) hypertension: Secondary | ICD-10-CM | POA: Diagnosis present

## 2021-10-05 DIAGNOSIS — D649 Anemia, unspecified: Secondary | ICD-10-CM | POA: Diagnosis present

## 2021-10-05 DIAGNOSIS — Z794 Long term (current) use of insulin: Secondary | ICD-10-CM

## 2021-10-05 DIAGNOSIS — I96 Gangrene, not elsewhere classified: Secondary | ICD-10-CM

## 2021-10-05 DIAGNOSIS — G2581 Restless legs syndrome: Secondary | ICD-10-CM | POA: Diagnosis present

## 2021-10-05 DIAGNOSIS — L03116 Cellulitis of left lower limb: Principal | ICD-10-CM | POA: Diagnosis present

## 2021-10-05 DIAGNOSIS — E875 Hyperkalemia: Secondary | ICD-10-CM | POA: Diagnosis present

## 2021-10-05 DIAGNOSIS — E663 Overweight: Secondary | ICD-10-CM | POA: Diagnosis present

## 2021-10-05 DIAGNOSIS — E1165 Type 2 diabetes mellitus with hyperglycemia: Secondary | ICD-10-CM | POA: Diagnosis present

## 2021-10-05 DIAGNOSIS — D75839 Thrombocytosis, unspecified: Secondary | ICD-10-CM | POA: Diagnosis present

## 2021-10-05 DIAGNOSIS — Z885 Allergy status to narcotic agent status: Secondary | ICD-10-CM

## 2021-10-05 DIAGNOSIS — Z6829 Body mass index (BMI) 29.0-29.9, adult: Secondary | ICD-10-CM

## 2021-10-05 DIAGNOSIS — G4733 Obstructive sleep apnea (adult) (pediatric): Secondary | ICD-10-CM | POA: Diagnosis present

## 2021-10-05 DIAGNOSIS — L97529 Non-pressure chronic ulcer of other part of left foot with unspecified severity: Secondary | ICD-10-CM | POA: Diagnosis present

## 2021-10-05 DIAGNOSIS — E1140 Type 2 diabetes mellitus with diabetic neuropathy, unspecified: Secondary | ICD-10-CM | POA: Diagnosis present

## 2021-10-05 DIAGNOSIS — Z888 Allergy status to other drugs, medicaments and biological substances status: Secondary | ICD-10-CM

## 2021-10-05 DIAGNOSIS — Z7982 Long term (current) use of aspirin: Secondary | ICD-10-CM

## 2021-10-05 DIAGNOSIS — Z7984 Long term (current) use of oral hypoglycemic drugs: Secondary | ICD-10-CM

## 2021-10-05 DIAGNOSIS — E11621 Type 2 diabetes mellitus with foot ulcer: Secondary | ICD-10-CM | POA: Diagnosis present

## 2021-10-05 DIAGNOSIS — E1152 Type 2 diabetes mellitus with diabetic peripheral angiopathy with gangrene: Principal | ICD-10-CM | POA: Diagnosis present

## 2021-10-05 DIAGNOSIS — E119 Type 2 diabetes mellitus without complications: Secondary | ICD-10-CM

## 2021-10-05 DIAGNOSIS — K760 Fatty (change of) liver, not elsewhere classified: Secondary | ICD-10-CM | POA: Diagnosis present

## 2021-10-05 DIAGNOSIS — Z91040 Latex allergy status: Secondary | ICD-10-CM

## 2021-10-05 LAB — COMPREHENSIVE METABOLIC PANEL
ALT: 16 U/L (ref 0–44)
AST: 23 U/L (ref 15–41)
Albumin: 3.5 g/dL (ref 3.5–5.0)
Alkaline Phosphatase: 71 U/L (ref 38–126)
Anion gap: 14 (ref 5–15)
BUN: 17 mg/dL (ref 8–23)
CO2: 24 mmol/L (ref 22–32)
Calcium: 9.5 mg/dL (ref 8.9–10.3)
Chloride: 100 mmol/L (ref 98–111)
Creatinine, Ser: 0.96 mg/dL (ref 0.44–1.00)
GFR, Estimated: >60 mL/min (ref >60)
Glucose, Bld: 151 mg/dL — ABNORMAL HIGH (ref 70–99)
Potassium: 4.4 mmol/L (ref 3.5–5.1)
Sodium: 138 mmol/L (ref 135–145)
Total Bilirubin: 0.6 mg/dL (ref 0.3–1.2)
Total Protein: 7.2 g/dL (ref 6.5–8.1)

## 2021-10-05 LAB — CBC WITH DIFFERENTIAL/PLATELET
Abs Immature Granulocytes: 0.03 10*3/uL (ref 0.00–0.07)
Basophils Absolute: 0.1 10*3/uL (ref 0.0–0.1)
Basophils Relative: 1 %
Eosinophils Absolute: 0.4 10*3/uL (ref 0.0–0.5)
Eosinophils Relative: 3 %
HCT: 35.8 % — ABNORMAL LOW (ref 36.0–46.0)
Hemoglobin: 11.2 g/dL — ABNORMAL LOW (ref 12.0–15.0)
Immature Granulocytes: 0 %
Lymphocytes Relative: 27 %
Lymphs Abs: 3 10*3/uL (ref 0.7–4.0)
MCH: 27.1 pg (ref 26.0–34.0)
MCHC: 31.3 g/dL (ref 30.0–36.0)
MCV: 86.5 fL (ref 80.0–100.0)
Monocytes Absolute: 0.8 10*3/uL (ref 0.1–1.0)
Monocytes Relative: 7 %
Neutro Abs: 6.9 10*3/uL (ref 1.7–7.7)
Neutrophils Relative %: 62 %
Platelets: 532 10*3/uL — ABNORMAL HIGH (ref 150–400)
RBC: 4.14 MIL/uL (ref 3.87–5.11)
RDW: 14.8 % (ref 11.5–15.5)
WBC: 11.1 10*3/uL — ABNORMAL HIGH (ref 4.0–10.5)
nRBC: 0 % (ref 0.0–0.2)

## 2021-10-05 LAB — PROTIME-INR
INR: 1 (ref 0.8–1.2)
Prothrombin Time: 12.8 seconds (ref 11.4–15.2)

## 2021-10-05 LAB — CBG MONITORING, ED: Glucose-Capillary: 126 mg/dL — ABNORMAL HIGH (ref 70–99)

## 2021-10-05 LAB — LACTIC ACID, PLASMA: Lactic Acid, Venous: 1.6 mmol/L (ref 0.5–1.9)

## 2021-10-05 MED ORDER — OXYCODONE-ACETAMINOPHEN 5-325 MG PO TABS
1.0000 | ORAL_TABLET | Freq: Once | ORAL | Status: AC
  Administered 2021-10-05: 1 via ORAL
  Filled 2021-10-05: qty 1

## 2021-10-05 NOTE — ED Provider Triage Note (Signed)
Emergency Medicine Provider Triage Evaluation Note  Anna Anna [name] , Anna Anna [name] y.o. female  was evaluated in triage.  Pt complains of concerns for left foot infection onset today.  Patient notes that her pain is becoming unbearable today and that is the cause for her visit to the ED.  Has Anna [name] subjective fever.  Denies vomiting/nausea.  Pt has Anna [name] scheduled left BKA scheduled with [name] on 10/11/21.   Review of Systems  Positive: As per HPI Negative:   Physical Exam  BP (!) 149/111   Pulse 85   Temp 98 F (36.7 C) (Oral)   Resp 16   SpO2 98%  Gen:   Awake, no distress   Resp:  Normal effort  MSK:   Moves extremities without difficulty  Other:  Dry gangrenous area noted to the left lateral toes.  Fourth toe amputated.  Gangrenous area noted to medial aspect of left great toe.  Mild erythema noted to the foot.  No increased warmth.  Medical Decision Making  Medically screening exam initiated at 3:28 PM.  Appropriate orders placed.  Anna Anna [name] was informed that the remainder of the evaluation will be completed by another provider, this initial triage assessment does not replace that evaluation, and the importance of remaining in the ED until their evaluation is complete.  Work-up initiated   Anna Knust A, PA-C 10/05/21 1552

## 2021-10-05 NOTE — ED Triage Notes (Signed)
Patient BIB [facility] from [facility] of the triad for evaluation of left foot infection, multiple amputations from same foot, gangrenous wound on left foot. Alert, oriented, and in no apparent distress at this time.

## 2021-10-05 NOTE — Telephone Encounter (Signed)
Nurse practitioner called on behalf of pt and wanted to see if someone could do pt's surgery due to pt's increase pain and her concern for sepsis. Myself and Dr. Roda Shutters both advised for pt to go to the ER to be evaluated. She stated she would have the pt do this.

## 2021-10-06 ENCOUNTER — Other Ambulatory Visit: Payer: Self-pay

## 2021-10-06 ENCOUNTER — Encounter (HOSPITAL_COMMUNITY): Payer: Self-pay | Admitting: Internal Medicine

## 2021-10-06 DIAGNOSIS — F39 Unspecified mood [affective] disorder: Secondary | ICD-10-CM | POA: Diagnosis present

## 2021-10-06 DIAGNOSIS — G4733 Obstructive sleep apnea (adult) (pediatric): Secondary | ICD-10-CM | POA: Diagnosis present

## 2021-10-06 DIAGNOSIS — D75839 Thrombocytosis, unspecified: Secondary | ICD-10-CM | POA: Diagnosis present

## 2021-10-06 DIAGNOSIS — E1140 Type 2 diabetes mellitus with diabetic neuropathy, unspecified: Secondary | ICD-10-CM | POA: Diagnosis present

## 2021-10-06 DIAGNOSIS — L03116 Cellulitis of left lower limb: Secondary | ICD-10-CM | POA: Diagnosis present

## 2021-10-06 DIAGNOSIS — Z9989 Dependence on other enabling machines and devices: Secondary | ICD-10-CM | POA: Diagnosis not present

## 2021-10-06 DIAGNOSIS — I10 Essential (primary) hypertension: Secondary | ICD-10-CM | POA: Diagnosis present

## 2021-10-06 DIAGNOSIS — I70262 Atherosclerosis of native arteries of extremities with gangrene, left leg: Secondary | ICD-10-CM | POA: Diagnosis present

## 2021-10-06 DIAGNOSIS — L97529 Non-pressure chronic ulcer of other part of left foot with unspecified severity: Secondary | ICD-10-CM | POA: Diagnosis present

## 2021-10-06 DIAGNOSIS — E1152 Type 2 diabetes mellitus with diabetic peripheral angiopathy with gangrene: Secondary | ICD-10-CM | POA: Diagnosis present

## 2021-10-06 DIAGNOSIS — N179 Acute kidney failure, unspecified: Secondary | ICD-10-CM | POA: Diagnosis present

## 2021-10-06 DIAGNOSIS — Z6829 Body mass index (BMI) 29.0-29.9, adult: Secondary | ICD-10-CM | POA: Diagnosis not present

## 2021-10-06 DIAGNOSIS — K760 Fatty (change of) liver, not elsewhere classified: Secondary | ICD-10-CM | POA: Diagnosis present

## 2021-10-06 DIAGNOSIS — E663 Overweight: Secondary | ICD-10-CM | POA: Diagnosis present

## 2021-10-06 DIAGNOSIS — E08319 Diabetes mellitus due to underlying condition with unspecified diabetic retinopathy without macular edema: Secondary | ICD-10-CM | POA: Diagnosis not present

## 2021-10-06 DIAGNOSIS — I96 Gangrene, not elsewhere classified: Secondary | ICD-10-CM | POA: Diagnosis not present

## 2021-10-06 DIAGNOSIS — E875 Hyperkalemia: Secondary | ICD-10-CM | POA: Diagnosis present

## 2021-10-06 DIAGNOSIS — D649 Anemia, unspecified: Secondary | ICD-10-CM | POA: Diagnosis present

## 2021-10-06 DIAGNOSIS — E11621 Type 2 diabetes mellitus with foot ulcer: Secondary | ICD-10-CM | POA: Diagnosis present

## 2021-10-06 DIAGNOSIS — E11319 Type 2 diabetes mellitus with unspecified diabetic retinopathy without macular edema: Secondary | ICD-10-CM | POA: Diagnosis present

## 2021-10-06 DIAGNOSIS — E785 Hyperlipidemia, unspecified: Secondary | ICD-10-CM | POA: Diagnosis present

## 2021-10-06 DIAGNOSIS — G2581 Restless legs syndrome: Secondary | ICD-10-CM | POA: Diagnosis present

## 2021-10-06 DIAGNOSIS — J449 Chronic obstructive pulmonary disease, unspecified: Secondary | ICD-10-CM | POA: Diagnosis present

## 2021-10-06 DIAGNOSIS — F419 Anxiety disorder, unspecified: Secondary | ICD-10-CM | POA: Diagnosis present

## 2021-10-06 DIAGNOSIS — Z888 Allergy status to other drugs, medicaments and biological substances status: Secondary | ICD-10-CM | POA: Diagnosis not present

## 2021-10-06 DIAGNOSIS — Z881 Allergy status to other antibiotic agents status: Secondary | ICD-10-CM | POA: Diagnosis not present

## 2021-10-06 DIAGNOSIS — Z885 Allergy status to narcotic agent status: Secondary | ICD-10-CM | POA: Diagnosis not present

## 2021-10-06 DIAGNOSIS — E1165 Type 2 diabetes mellitus with hyperglycemia: Secondary | ICD-10-CM | POA: Diagnosis present

## 2021-10-06 DIAGNOSIS — Z794 Long term (current) use of insulin: Secondary | ICD-10-CM | POA: Diagnosis not present

## 2021-10-06 LAB — GLUCOSE, CAPILLARY: Glucose-Capillary: 125 mg/dL — ABNORMAL HIGH (ref 70–99)

## 2021-10-06 LAB — CBC WITH DIFFERENTIAL/PLATELET
Abs Immature Granulocytes: 0.05 10*3/uL (ref 0.00–0.07)
Basophils Absolute: 0.1 10*3/uL (ref 0.0–0.1)
Basophils Relative: 1 %
Eosinophils Absolute: 0.5 10*3/uL (ref 0.0–0.5)
Eosinophils Relative: 4 %
HCT: 34.5 % — ABNORMAL LOW (ref 36.0–46.0)
Hemoglobin: 10.4 g/dL — ABNORMAL LOW (ref 12.0–15.0)
Immature Granulocytes: 0 %
Lymphocytes Relative: 28 %
Lymphs Abs: 3.3 10*3/uL (ref 0.7–4.0)
MCH: 26.7 pg (ref 26.0–34.0)
MCHC: 30.1 g/dL (ref 30.0–36.0)
MCV: 88.5 fL (ref 80.0–100.0)
Monocytes Absolute: 1 10*3/uL (ref 0.1–1.0)
Monocytes Relative: 9 %
Neutro Abs: 6.6 10*3/uL (ref 1.7–7.7)
Neutrophils Relative %: 58 %
Platelets: 512 10*3/uL — ABNORMAL HIGH (ref 150–400)
RBC: 3.9 MIL/uL (ref 3.87–5.11)
RDW: 14.8 % (ref 11.5–15.5)
WBC: 11.5 10*3/uL — ABNORMAL HIGH (ref 4.0–10.5)
nRBC: 0.2 % (ref 0.0–0.2)

## 2021-10-06 LAB — COMPREHENSIVE METABOLIC PANEL
ALT: 15 U/L (ref 0–44)
AST: 22 U/L (ref 15–41)
Albumin: 3.3 g/dL — ABNORMAL LOW (ref 3.5–5.0)
Alkaline Phosphatase: 65 U/L (ref 38–126)
Anion gap: 13 (ref 5–15)
BUN: 15 mg/dL (ref 8–23)
CO2: 25 mmol/L (ref 22–32)
Calcium: 9.1 mg/dL (ref 8.9–10.3)
Chloride: 101 mmol/L (ref 98–111)
Creatinine, Ser: 0.98 mg/dL (ref 0.44–1.00)
GFR, Estimated: >60 mL/min (ref >60)
Glucose, Bld: 120 mg/dL — ABNORMAL HIGH (ref 70–99)
Potassium: 3.8 mmol/L (ref 3.5–5.1)
Sodium: 139 mmol/L (ref 135–145)
Total Bilirubin: 0.2 mg/dL — ABNORMAL LOW (ref 0.3–1.2)
Total Protein: 6.7 g/dL (ref 6.5–8.1)

## 2021-10-06 LAB — LACTIC ACID, PLASMA: Lactic Acid, Venous: 0.9 mmol/L (ref 0.5–1.9)

## 2021-10-06 LAB — SEDIMENTATION RATE: Sed Rate: 84 mm/hr — ABNORMAL HIGH (ref 0–22)

## 2021-10-06 LAB — CBG MONITORING, ED
Glucose-Capillary: 116 mg/dL — ABNORMAL HIGH (ref 70–99)
Glucose-Capillary: 115 mg/dL — ABNORMAL HIGH (ref 70–99)
Glucose-Capillary: 124 mg/dL — ABNORMAL HIGH (ref 70–99)

## 2021-10-06 LAB — C-REACTIVE PROTEIN: CRP: 0.8 mg/dL (ref <1.0)

## 2021-10-06 LAB — PREALBUMIN: Prealbumin: 20 mg/dL (ref 18–38)

## 2021-10-06 LAB — MAGNESIUM: Magnesium: 2.2 mg/dL (ref 1.7–2.4)

## 2021-10-06 MED ORDER — GABAPENTIN 300 MG PO CAPS
300.0000 mg | ORAL_CAPSULE | Freq: Three times a day (TID) | ORAL | Status: DC
  Administered 2021-10-06 – 2021-10-13 (×22): 300 mg via ORAL
  Filled 2021-10-06 (×22): qty 1

## 2021-10-06 MED ORDER — FENTANYL CITRATE PF 50 MCG/ML IJ SOSY
100.0000 ug | PREFILLED_SYRINGE | Freq: Once | INTRAMUSCULAR | Status: AC
  Administered 2021-10-06: 100 ug via INTRAVENOUS
  Filled 2021-10-06: qty 2

## 2021-10-06 MED ORDER — POLYETHYLENE GLYCOL 3350 17 G PO PACK
17.0000 g | PACK | Freq: Every day | ORAL | Status: DC | PRN
Start: 2021-10-06 — End: 2021-10-11
  Administered 2021-10-09: 17 g via ORAL
  Filled 2021-10-06: qty 1

## 2021-10-06 MED ORDER — CHLORHEXIDINE GLUCONATE 4 % EX LIQD: 60.0000 mL | Freq: Once | CUTANEOUS | Status: DC

## 2021-10-06 MED ORDER — DOCUSATE SODIUM 100 MG PO CAPS
100.0000 mg | ORAL_CAPSULE | Freq: Two times a day (BID) | ORAL | Status: DC
  Administered 2021-10-06 – 2021-10-11 (×11): 100 mg via ORAL
  Filled 2021-10-06 (×11): qty 1

## 2021-10-06 MED ORDER — ACETAMINOPHEN 325 MG PO TABS
650.0000 mg | ORAL_TABLET | Freq: Four times a day (QID) | ORAL | Status: DC | PRN
  Administered 2021-10-07 – 2021-10-10 (×4): 650 mg via ORAL
  Filled 2021-10-06 (×4): qty 2

## 2021-10-06 MED ORDER — CHLORHEXIDINE GLUCONATE 4 % EX LIQD
60.0000 mL | Freq: Once | CUTANEOUS | Status: AC
  Administered 2021-10-11: 4 via TOPICAL
  Filled 2021-10-06: qty 15

## 2021-10-06 MED ORDER — POVIDONE-IODINE 10 % EX SWAB
2.0000 | Freq: Once | CUTANEOUS | Status: AC
  Administered 2021-10-11: 2 via TOPICAL

## 2021-10-06 MED ORDER — ONDANSETRON HCL 4 MG/2ML IJ SOLN
4.0000 mg | Freq: Four times a day (QID) | INTRAMUSCULAR | Status: DC | PRN
  Administered 2021-10-07: 4 mg via INTRAVENOUS
  Filled 2021-10-06: qty 2

## 2021-10-06 MED ORDER — INSULIN ASPART 100 UNIT/ML IJ SOLN
0.0000 [IU] | Freq: Three times a day (TID) | INTRAMUSCULAR | Status: DC
  Administered 2021-10-07: 3 [IU] via SUBCUTANEOUS
  Administered 2021-10-07: 2 [IU] via SUBCUTANEOUS
  Administered 2021-10-08 – 2021-10-09 (×2): 3 [IU] via SUBCUTANEOUS
  Administered 2021-10-09: 2 [IU] via SUBCUTANEOUS
  Administered 2021-10-10: 3 [IU] via SUBCUTANEOUS
  Administered 2021-10-10: 2 [IU] via SUBCUTANEOUS
  Administered 2021-10-11 – 2021-10-12 (×2): 5 [IU] via SUBCUTANEOUS
  Administered 2021-10-12: 15 [IU] via SUBCUTANEOUS
  Administered 2021-10-12 – 2021-10-13 (×3): 2 [IU] via SUBCUTANEOUS

## 2021-10-06 MED ORDER — ACETAMINOPHEN 650 MG RE SUPP: 650.0000 mg | Freq: Four times a day (QID) | RECTAL | Status: DC | PRN

## 2021-10-06 MED ORDER — HYDROMORPHONE HCL 1 MG/ML IJ SOLN
0.5000 mg | INTRAMUSCULAR | Status: DC | PRN
  Administered 2021-10-06: 0.5 mg via INTRAVENOUS
  Filled 2021-10-06: qty 1

## 2021-10-06 MED ORDER — ONDANSETRON HCL 4 MG PO TABS
4.0000 mg | ORAL_TABLET | Freq: Four times a day (QID) | ORAL | Status: DC | PRN
  Administered 2021-10-10: 4 mg via ORAL
  Filled 2021-10-06 (×2): qty 1

## 2021-10-06 MED ORDER — GUAIFENESIN ER 600 MG PO TB12
600.0000 mg | ORAL_TABLET | Freq: Two times a day (BID) | ORAL | Status: DC
  Administered 2021-10-06 – 2021-10-13 (×14): 600 mg via ORAL
  Filled 2021-10-06 (×14): qty 1

## 2021-10-06 MED ORDER — METHOCARBAMOL 500 MG PO TABS
500.0000 mg | ORAL_TABLET | Freq: Two times a day (BID) | ORAL | Status: DC | PRN
Start: 2021-10-06 — End: 2021-10-13
  Administered 2021-10-06 – 2021-10-13 (×9): 500 mg via ORAL
  Filled 2021-10-06 (×9): qty 1

## 2021-10-06 MED ORDER — INSULIN GLARGINE-YFGN 100 UNIT/ML ~~LOC~~ SOLN
25.0000 [IU] | Freq: Every day | SUBCUTANEOUS | Status: DC
Start: 2021-10-06 — End: 2021-10-10
  Administered 2021-10-06 – 2021-10-09 (×4): 25 [IU] via SUBCUTANEOUS
  Filled 2021-10-06 (×5): qty 0.25

## 2021-10-06 MED ORDER — NALOXONE HCL 0.4 MG/ML IJ SOLN: 0.4000 mg | INTRAMUSCULAR | Status: DC | PRN

## 2021-10-06 MED ORDER — ENOXAPARIN SODIUM 40 MG/0.4ML IJ SOSY
40.0000 mg | PREFILLED_SYRINGE | INTRAMUSCULAR | Status: DC
  Administered 2021-10-06 – 2021-10-13 (×7): 40 mg via SUBCUTANEOUS
  Filled 2021-10-06 (×7): qty 0.4

## 2021-10-06 MED ORDER — CEFAZOLIN SODIUM-DEXTROSE 2-4 GM/100ML-% IV SOLN
2.0000 g | INTRAVENOUS | Status: AC
  Administered 2021-10-11: 2 g via INTRAVENOUS

## 2021-10-06 MED ORDER — METRONIDAZOLE 500 MG/100ML IV SOLN
500.0000 mg | Freq: Two times a day (BID) | INTRAVENOUS | Status: DC
  Administered 2021-10-06 – 2021-10-12 (×13): 500 mg via INTRAVENOUS
  Filled 2021-10-06 (×13): qty 100

## 2021-10-06 MED ORDER — TOPIRAMATE 25 MG PO TABS
25.0000 mg | ORAL_TABLET | Freq: Every day | ORAL | Status: DC
  Administered 2021-10-06 – 2021-10-13 (×8): 25 mg via ORAL
  Filled 2021-10-06 (×8): qty 1

## 2021-10-06 MED ORDER — INSULIN ASPART 100 UNIT/ML IJ SOLN
0.0000 [IU] | Freq: Every day | INTRAMUSCULAR | Status: DC
  Administered 2021-10-11: 5 [IU] via SUBCUTANEOUS

## 2021-10-06 MED ORDER — VANCOMYCIN HCL IN DEXTROSE 1-5 GM/200ML-% IV SOLN
1000.0000 mg | INTRAVENOUS | Status: AC
  Administered 2021-10-07 – 2021-10-12 (×6): 1000 mg via INTRAVENOUS
  Filled 2021-10-06 (×7): qty 200

## 2021-10-06 MED ORDER — INSULIN ASPART 100 UNIT/ML IJ SOLN
0.0000 [IU] | Freq: Four times a day (QID) | INTRAMUSCULAR | Status: DC
  Administered 2021-10-06: 1 [IU] via SUBCUTANEOUS

## 2021-10-06 MED ORDER — LISINOPRIL 5 MG PO TABS
5.0000 mg | ORAL_TABLET | Freq: Every day | ORAL | Status: DC
  Administered 2021-10-06 – 2021-10-13 (×8): 5 mg via ORAL
  Filled 2021-10-06 (×8): qty 1

## 2021-10-06 MED ORDER — ALBUTEROL SULFATE (2.5 MG/3ML) 0.083% IN NEBU
2.5000 mg | INHALATION_SOLUTION | Freq: Four times a day (QID) | RESPIRATORY_TRACT | Status: DC | PRN
  Administered 2021-10-11: 2.5 mg via RESPIRATORY_TRACT
  Filled 2021-10-06: qty 3

## 2021-10-06 MED ORDER — PIPERACILLIN-TAZOBACTAM 3.375 G IVPB 30 MIN
3.3750 g | Freq: Once | INTRAVENOUS | Status: AC
  Administered 2021-10-06: 3.375 g via INTRAVENOUS
  Filled 2021-10-06: qty 50

## 2021-10-06 MED ORDER — BISACODYL 5 MG PO TBEC
5.0000 mg | DELAYED_RELEASE_TABLET | Freq: Every day | ORAL | Status: DC | PRN
  Administered 2021-10-09: 5 mg via ORAL
  Filled 2021-10-06: qty 1

## 2021-10-06 MED ORDER — PANTOPRAZOLE SODIUM 40 MG PO TBEC
40.0000 mg | DELAYED_RELEASE_TABLET | Freq: Two times a day (BID) | ORAL | Status: DC
  Administered 2021-10-06 – 2021-10-13 (×15): 40 mg via ORAL
  Filled 2021-10-06 (×15): qty 1

## 2021-10-06 MED ORDER — VANCOMYCIN HCL 1250 MG/250ML IV SOLN: 1250.0000 mg | INTRAVENOUS | Status: DC

## 2021-10-06 MED ORDER — HYDRALAZINE HCL 20 MG/ML IJ SOLN: 5.0000 mg | INTRAMUSCULAR | Status: DC | PRN

## 2021-10-06 MED ORDER — TRANEXAMIC ACID 1000 MG/10ML IV SOLN
2000.0000 mg | INTRAVENOUS | Status: DC
  Filled 2021-10-06: qty 20

## 2021-10-06 MED ORDER — SODIUM CHLORIDE 0.9 % IV SOLN
2.0000 g | INTRAVENOUS | Status: AC
  Administered 2021-10-06 – 2021-10-12 (×7): 2 g via INTRAVENOUS
  Filled 2021-10-06 (×7): qty 20

## 2021-10-06 MED ORDER — HYDROMORPHONE HCL 1 MG/ML IJ SOLN
0.5000 mg | INTRAMUSCULAR | Status: DC | PRN
  Administered 2021-10-06 – 2021-10-11 (×15): 0.5 mg via INTRAVENOUS
  Filled 2021-10-06: qty 0.5
  Filled 2021-10-06: qty 1
  Filled 2021-10-06 (×7): qty 0.5
  Filled 2021-10-06: qty 1
  Filled 2021-10-06 (×9): qty 0.5

## 2021-10-06 MED ORDER — POVIDONE-IODINE 10 % EX SWAB: 2.0000 | Freq: Once | CUTANEOUS | Status: DC

## 2021-10-06 MED ORDER — FLUOXETINE HCL 20 MG PO CAPS
80.0000 mg | ORAL_CAPSULE | Freq: Every day | ORAL | Status: DC
  Administered 2021-10-06 – 2021-10-13 (×8): 80 mg via ORAL
  Filled 2021-10-06 (×8): qty 4

## 2021-10-06 MED ORDER — ACETAMINOPHEN 325 MG PO TABS: 650.0000 mg | ORAL_TABLET | Freq: Four times a day (QID) | ORAL | Status: DC | PRN

## 2021-10-06 MED ORDER — VANCOMYCIN HCL 1500 MG/300ML IV SOLN
1500.0000 mg | Freq: Once | INTRAVENOUS | Status: AC
  Administered 2021-10-06: 1500 mg via INTRAVENOUS
  Filled 2021-10-06: qty 300

## 2021-10-06 MED ORDER — BUSPIRONE HCL 5 MG PO TABS
25.0000 mg | ORAL_TABLET | Freq: Two times a day (BID) | ORAL | Status: DC
  Administered 2021-10-06 – 2021-10-13 (×15): 25 mg via ORAL
  Filled 2021-10-06 (×5): qty 1
  Filled 2021-10-06: qty 3
  Filled 2021-10-06 (×9): qty 1

## 2021-10-06 MED ORDER — GLUCERNA SHAKE PO LIQD
237.0000 mL | Freq: Three times a day (TID) | ORAL | Status: DC
  Administered 2021-10-07 – 2021-10-12 (×13): 237 mL via ORAL

## 2021-10-06 MED ORDER — CLOPIDOGREL BISULFATE 75 MG PO TABS
75.0000 mg | ORAL_TABLET | Freq: Every day | ORAL | Status: DC
  Administered 2021-10-06 – 2021-10-13 (×8): 75 mg via ORAL
  Filled 2021-10-06 (×9): qty 1

## 2021-10-06 MED ORDER — ASPIRIN 81 MG PO TBEC
81.0000 mg | DELAYED_RELEASE_TABLET | Freq: Every day | ORAL | Status: DC
  Administered 2021-10-06 – 2021-10-13 (×8): 81 mg via ORAL
  Filled 2021-10-06 (×8): qty 1

## 2021-10-06 MED ORDER — ROPINIROLE HCL 0.5 MG PO TABS
0.2500 mg | ORAL_TABLET | Freq: Every day | ORAL | Status: DC
  Administered 2021-10-06 – 2021-10-12 (×7): 0.25 mg via ORAL
  Filled 2021-10-06 (×8): qty 1

## 2021-10-06 MED ORDER — ALPRAZOLAM 0.5 MG PO TABS
1.0000 mg | ORAL_TABLET | Freq: Three times a day (TID) | ORAL | Status: DC | PRN
  Administered 2021-10-07 (×3): 1 mg via ORAL
  Filled 2021-10-06 (×4): qty 2

## 2021-10-06 MED ORDER — OXYCODONE HCL 5 MG PO TABS
5.0000 mg | ORAL_TABLET | Freq: Four times a day (QID) | ORAL | Status: DC | PRN
  Administered 2021-10-06 – 2021-10-07 (×5): 10 mg via ORAL
  Filled 2021-10-06 (×5): qty 2

## 2021-10-06 MED ORDER — HYDROXYZINE HCL 10 MG PO TABS
10.0000 mg | ORAL_TABLET | Freq: Two times a day (BID) | ORAL | Status: DC
  Administered 2021-10-06 – 2021-10-13 (×15): 10 mg via ORAL
  Filled 2021-10-06 (×15): qty 1

## 2021-10-06 NOTE — Progress Notes (Signed)
Pt arrived to 5 north 30. Alert and oriented x 4, identified appropriately.  VS stable, no signs of acute distress, pt denies chest pain, SOB. Pt oriented to room and equipment, instructed to call for assistance and how to use call light. Call light left within pt reach, bed alarm in place. Will continue to monitor pt and treat per MD orders.

## 2021-10-06 NOTE — Consult Note (Signed)
Reason for Consult:Left foot ulcer Referring Physician: Jonah Blue Time called: [id number] Time at bedside: 1059   Anna [name] is an 66 y.o. female.  HPI: Fabiana was scheduled for BKA on Wednesday but presented to the ED with increased left foot pain. She has had about a year-long hx/o left foot ulcerations. She denies systemic symptoms such as fevers, chills, sweats, N/V.  Past Medical History:  Diagnosis Date   Arthritis    Asthma    Back pain    Bronchitis    Bursitis    Diabetes mellitus    Diabetic neuropathy (HCC)    Diabetic retinopathy    Hyperlipemia    Hypertension    Migraine    Obesity    Sepsis due to urinary tract infection [facility])     Past Surgical History:  Procedure Laterality Date   ABDOMINAL AORTOGRAM W/LOWER EXTREMITY Left 08/22/2021   Procedure: ABDOMINAL AORTOGRAM W/LOWER EXTREMITY;  Surgeon: Nada Libman, MD;  Location: [facility];  Service: Cardiovascular;  Laterality: Left;   CESAREAN SECTION     x3   CHOLECYSTECTOMY     EYE SURGERY Left    METATARSAL HEAD EXCISION Left 08/24/2021   Procedure: METATARSAL HEAD EXCISION FIFTH TOE;  Surgeon: Vivi Barrack, DPM;  Location: [facility] OR;  Service: Podiatry;  Laterality: Left;   PERIPHERAL VASCULAR BALLOON ANGIOPLASTY  08/22/2021   Procedure: PERIPHERAL VASCULAR BALLOON ANGIOPLASTY;  Surgeon: Nada Libman, MD;  Location: [facility];  Service: Cardiovascular;;  Left AT   WOUND DEBRIDEMENT Left 08/24/2021   Procedure: DEBRIDEMENT WOUND OF LEFT FOOT;  Surgeon: Vivi Barrack, DPM;  Location: [facility] OR;  Service: Podiatry;  Laterality: Left;    History reviewed. No pertinent family history.  Social History:  reports that she has never smoked. She has never used smokeless tobacco. She reports that she does not drink alcohol and does not use drugs.  Allergies:  Allergies  Allergen Reactions   Bacitracin     Other reaction(s): Other Burns skin     Citalopram Diarrhea   Doxycycline  Other (See Comments)    Other reaction(s): Other "burning" all over body     Morphine And Related Hives   Neomycin     Other reaction(s): Other (See Comments), Unknown unknown    Ciprofloxacin     Tongue swells   Ciprofloxacin-Dexamethasone Other (See Comments)    unknown    Duloxetine     Other reaction(s): Mental Status Changes (intolerance) "saw pictures of gun"   Latex Itching   Levaquin [Levofloxacin In D5w] Other (See Comments)    Pt does not remember reaction but states she's had issues with med   Piper     Other reaction(s): Other (See Comments) White pepper- uncontrollable sneezing    Salvia Officinalis     Other reaction(s): Other (See Comments) Sneezing   Soma [Carisoprodol]     Sleepy and constipation   Lasix [Furosemide] Rash    Medications: I have reviewed the patient's current medications.  Results for orders placed or performed during the hospital encounter of 10/05/21 (from the past 48 hour(s))  Comprehensive metabolic panel     Status: Abnormal   Collection Time: 10/05/21  2:37 PM  Result Value Ref Range   Sodium 138 135 - 145 mmol/L   Potassium 4.4 3.5 - 5.1 mmol/L   Chloride 100 98 - 111 mmol/L   CO2 24 22 - 32 mmol/L   Glucose, Bld 151 (H) 70 - 99  mg/dL    Comment: Glucose reference range applies only to samples taken after fasting for at least 8 hours.   BUN 17 8 - 23 mg/dL   Creatinine, Ser 5.63 0.44 - 1.00 mg/dL   Calcium 9.5 8.9 - 87.5 mg/dL   Total Protein 7.2 6.5 - 8.1 g/dL   Albumin 3.5 3.5 - 5.0 g/dL   AST 23 15 - 41 U/L   ALT 16 0 - 44 U/L   Alkaline Phosphatase 71 38 - 126 U/L   Total Bilirubin 0.6 0.3 - 1.2 mg/dL   GFR, Estimated >64 >33 mL/min    Comment: (NOTE) Calculated using the CKD-EPI Creatinine Equation (2021)    Anion gap 14 5 - 15    Comment: Performed at [facility], [address] [place], [place] [address]  Lactic acid, plasma     Status: None   Collection Time: 10/05/21  2:37 PM  Result Value Ref Range    Lactic Acid, Venous 1.6 0.5 - 1.9 mmol/L    Comment: Performed at [facility], [address], [place], [place] [address]  CBC with Differential     Status: Abnormal   Collection Time: 10/05/21  2:37 PM  Result Value Ref Range   WBC 11.1 (H) 4.0 - 10.5 K/uL   RBC 4.14 3.87 - 5.11 MIL/uL   Hemoglobin 11.2 (L) 12.0 - 15.0 g/dL   HCT 06.3 (L) 01.6 - 01.0 %   MCV 86.5 80.0 - 100.0 fL   MCH 27.1 26.0 - 34.0 pg   MCHC 31.3 30.0 - 36.0 g/dL   RDW 93.2 35.5 - 73.2 %   Platelets 532 (H) 150 - 400 K/uL   nRBC 0.0 0.0 - 0.2 %   Neutrophils Relative % 62 %   Neutro Abs 6.9 1.7 - 7.7 K/uL   Lymphocytes Relative 27 %   Lymphs Abs 3.0 0.7 - 4.0 K/uL   Monocytes Relative 7 %   Monocytes Absolute 0.8 0.1 - 1.0 K/uL   Eosinophils Relative 3 %   Eosinophils Absolute 0.4 0.0 - 0.5 K/uL   Basophils Relative 1 %   Basophils Absolute 0.1 0.0 - 0.1 K/uL   Immature Granulocytes 0 %   Abs Immature Granulocytes 0.03 0.00 - 0.07 K/uL    Comment: Performed at [facility], [address] [place], [place] [address]  Protime-INR     Status: None   Collection Time: 10/05/21  2:37 PM  Result Value Ref Range   Prothrombin Time 12.8 11.4 - 15.2 seconds   INR 1.0 0.8 - 1.2    Comment: (NOTE) INR goal varies based on device and disease states. Performed at [facility], [address] [place], [place] [address]   Culture, blood (Routine x 2)     Status: None (Preliminary result)   Collection Time: 10/05/21  2:37 PM   Specimen: BLOOD  Result Value Ref Range   Specimen Description BLOOD BLOOD LEFT FOREARM    Special Requests      BOTTLES DRAWN AEROBIC AND ANAEROBIC Blood Culture adequate volume   Culture      NO GROWTH < 24 HOURS Performed at [facility], [address] [place], [place] [address]    Report Status PENDING   Culture, blood (Routine x 2)     Status: None (Preliminary result)   Collection Time: 10/05/21  2:47 PM   Specimen: BLOOD  Result Value Ref Range    Specimen Description  BLOOD SITE NOT SPECIFIED    Special Requests      BOTTLES DRAWN AEROBIC AND ANAEROBIC Blood Culture adequate volume   Culture      NO GROWTH < 24 HOURS Performed at [facility], [address], [place], [place] [address]    Report Status PENDING   POC CBG, ED     Status: Abnormal   Collection Time: 10/05/21 11:34 PM  Result Value Ref Range   Glucose-Capillary 126 (H) 70 - 99 mg/dL    Comment: Glucose reference range applies only to samples taken after fasting for at least 8 hours.   Comment 1 Notify RN    Comment 2 Document in Chart   Lactic acid, plasma     Status: None   Collection Time: 10/06/21  2:06 AM  Result Value Ref Range   Lactic Acid, Venous 0.9 0.5 - 1.9 mmol/L    Comment: Performed at [facility], [address], [place], [place] [address]  CBC with Differential/Platelet     Status: Abnormal   Collection Time: 10/06/21  5:00 AM  Result Value Ref Range   WBC 11.5 (H) 4.0 - 10.5 K/uL   RBC 3.90 3.87 - 5.11 MIL/uL   Hemoglobin 10.4 (L) 12.0 - 15.0 g/dL   HCT 62.9 (L) 47.6 - 54.6 %   MCV 88.5 80.0 - 100.0 fL   MCH 26.7 26.0 - 34.0 pg   MCHC 30.1 30.0 - 36.0 g/dL   RDW 50.3 54.6 - 56.8 %   Platelets 512 (H) 150 - 400 K/uL   nRBC 0.2 0.0 - 0.2 %   Neutrophils Relative % 58 %   Neutro Abs 6.6 1.7 - 7.7 K/uL   Lymphocytes Relative 28 %   Lymphs Abs 3.3 0.7 - 4.0 K/uL   Monocytes Relative 9 %   Monocytes Absolute 1.0 0.1 - 1.0 K/uL   Eosinophils Relative 4 %   Eosinophils Absolute 0.5 0.0 - 0.5 K/uL   Basophils Relative 1 %   Basophils Absolute 0.1 0.0 - 0.1 K/uL   Immature Granulocytes 0 %   Abs Immature Granulocytes 0.05 0.00 - 0.07 K/uL    Comment: Performed at [facility], [address] [place], [place] [address]  Comprehensive metabolic panel     Status: Abnormal   Collection Time: 10/06/21  5:00 AM  Result Value Ref Range   Sodium 139 135 - 145 mmol/L   Potassium 3.8 3.5 - 5.1 mmol/L   Chloride 101 98 - 111  mmol/L   CO2 25 22 - 32 mmol/L   Glucose, Bld 120 (H) 70 - 99 mg/dL    Comment: Glucose reference range applies only to samples taken after fasting for at least 8 hours.   BUN 15 8 - 23 mg/dL   Creatinine, Ser 7.00 0.44 - 1.00 mg/dL   Calcium 9.1 8.9 - 17.4 mg/dL   Total Protein 6.7 6.5 - 8.1 g/dL   Albumin 3.3 (L) 3.5 - 5.0 g/dL   AST 22 15 - 41 U/L   ALT 15 0 - 44 U/L   Alkaline Phosphatase 65 38 - 126 U/L   Total Bilirubin 0.2 (L) 0.3 - 1.2 mg/dL   GFR, Estimated >94 >49 mL/min    Comment: (NOTE) Calculated using the CKD-EPI Creatinine Equation (2021)    Anion gap 13 5 - 15    Comment: Performed at [facility], [address], [place], [place] [address]  Magnesium  Status: None   Collection Time: 10/06/21  5:00 AM  Result Value Ref Range   Magnesium 2.2 1.7 - 2.4 mg/dL    Comment: Performed at [facility], [address], [place], [place] [address]  CBG monitoring, ED     Status: Abnormal   Collection Time: 10/06/21  7:52 AM  Result Value Ref Range   Glucose-Capillary 116 (H) 70 - 99 mg/dL    Comment: Glucose reference range applies only to samples taken after fasting for at least 8 hours.  C-reactive protein     Status: None   Collection Time: 10/06/21 10:24 AM  Result Value Ref Range   CRP 0.8 <1.0 mg/dL    Comment: Performed at [facility], [address] [place], [place] [address]  Prealbumin     Status: None   Collection Time: 10/06/21 10:24 AM  Result Value Ref Range   Prealbumin 20 18 - 38 mg/dL    Comment: Performed at [facility], [address] [place], [place] [address]    No results found.  Review of Systems  HENT:  Negative for ear discharge, ear pain, hearing loss and tinnitus.   Eyes:  Negative for photophobia and pain.  Respiratory:  Negative for cough and shortness of breath.   Cardiovascular:  Negative for chest pain.  Gastrointestinal:  Negative for abdominal pain, nausea and vomiting.  Genitourinary:  Negative  for dysuria, flank pain, frequency and urgency.  Musculoskeletal:  Positive for arthralgias (Left foot). Negative for back pain, myalgias and neck pain.  Neurological:  Negative for dizziness and headaches.  Hematological:  Does not bruise/bleed easily.  Psychiatric/Behavioral:  The patient is not nervous/anxious.    Blood pressure (!) 158/68, pulse 90, temperature 98.7 F (37.1 C), temperature source Oral, resp. rate 18, height 5\' 2"  (1.575 m), weight 72.6 kg, SpO2 99 %. Physical Exam Constitutional:      General: She is not in acute distress.    Appearance: She is well-developed. She is not diaphoretic.  HENT:     Head: Normocephalic and atraumatic.  Eyes:     General: No scleral icterus.       Right eye: No discharge.        Left eye: No discharge.     Conjunctiva/sclera: Conjunctivae normal.  Cardiovascular:     Rate and Rhythm: Normal rate and regular rhythm.  Pulmonary:     Effort: Pulmonary effort is normal. No respiratory distress.  Musculoskeletal:     Cervical back: Normal range of motion.  Feet:     Comments: Left foot: Necrotic ulceration lateral forefoot, small ulceration plantar 1st MTP, 2+ DP, 1+PT, mild diffuse erythema Skin:    General: Skin is warm and dry.  Neurological:     Mental Status: She is alert.  Psychiatric:        Mood and Affect: Mood normal.        Behavior: Behavior normal.     Assessment/Plan: Left foot ulcerations -- Continue to plan BKA Wednesday with Dr. Thursday. Appreciate medicine involvement for pain management and infection suppression until then.    Lajoyce Corners, PA-C Orthopedic Surgery [phone] 10/06/2021, 11:17 AM

## 2021-10-06 NOTE — Progress Notes (Signed)
Pharmacy Antibiotic Note  Anna [name] is a 66 y.o. female admitted on 10/05/2021 with  LLE cellulitis .  Pharmacy has been consulted for Vancomycin dosing. SCr stable 0.98.  Plan: Adjust vancomycin to 1g IV q24h. Goal AUC 400-550. Expected AUC: 461 SCr used: 0.98 Ceftriaxone 2g IV q24h per MD Flagyl 500mg  IV q12h per MD Monitor clinical progress, c/s, renal function F/u de-escalation plan/LOT, vancomycin levels as indicated   Height: 5\' 2"  (157.5 cm) Weight: 72.6 kg (160 lb) IBW/kg (Calculated) : 50.1  Temp (24hrs), Avg:98.3 F (36.8 C), Min:98 F (36.7 C), Max:98.7 F (37.1 C)  Recent Labs  Lab 10/05/21 1437 10/06/21 0206 10/06/21 0500  WBC 11.1*  --  11.5*  CREATININE 0.96  --  0.98  LATICACIDVEN 1.6 0.9  --      Estimated Creatinine Clearance: 52.7 mL/min (by C-G formula based on SCr of 0.98 mg/dL).    Allergies  Allergen Reactions   Bacitracin     Other reaction(s): Other Burns skin     Citalopram Diarrhea   Doxycycline Other (See Comments)    Other reaction(s): Other "burning" all over body     Morphine And Related Hives   Neomycin     Other reaction(s): Other (See Comments), Unknown unknown    Ciprofloxacin     Tongue swells   Ciprofloxacin-Dexamethasone Other (See Comments)    unknown    Duloxetine     Other reaction(s): Mental Status Changes (intolerance) "saw pictures of gun"   Latex Itching   Levaquin [Levofloxacin In D5w] Other (See Comments)    Pt does not remember reaction but states she's had issues with med   Piper     Other reaction(s): Other (See Comments) White pepper- uncontrollable sneezing    Salvia Officinalis     Other reaction(s): Other (See Comments) Sneezing   Soma [Carisoprodol]     Sleepy and constipation   Lasix [Furosemide] Rash     10/08/21 [place] 10/06/2021 10:03 AM

## 2021-10-06 NOTE — ED Provider Notes (Signed)
[facility] EMERGENCY DEPARTMENT Provider Note   CSN: [id number] Arrival date & time: 10/05/21  1419     History  Chief complaint - foot pain  Anna [name] is a 66 y.o. female.  HPI Patient with a history of diabetes presents with increasing pain in her left foot.  Patient reports she was told that the wounds on her foot are getting infected.  She reports fever and chills.  She reports she has intermittent episodes of pain in the foot.  No recent trauma.  She reports she is scheduled for surgery on the left leg next week.    Past Medical History:  Diagnosis Date   Arthritis    Asthma    Back pain    Bronchitis    Bursitis    Diabetes mellitus    Diabetic neuropathy (HCC)    Diabetic retinopathy    Hyperlipemia    Hypertension    Migraine    Obesity    Sepsis due to urinary tract infection (HCC)     Home Medications Prior to Admission medications   Medication Sig Start Date End Date Taking? Authorizing Provider  acetaminophen (TYLENOL) 500 MG tablet Take 1,000 mg by mouth every 6 (six) hours as needed for moderate pain.    [provider]  albuterol (VENTOLIN HFA) 108 (90 Base) MCG/ACT inhaler Inhale 1-2 puffs into the lungs every 6 (six) hours as needed for wheezing or shortness of breath.    [provider]  ALPRAZolam Prudy Feeler) 1 MG tablet Take 1 mg by mouth 3 (three) times daily as needed for anxiety.    [provider]  aspirin EC 81 MG tablet Take 1 tablet (81 mg total) by mouth daily. Swallow whole. 08/28/21 11/26/21  Lorin Glass, MD  Baclofen 5 MG TABS Take 5 mg by mouth 3 (three) times daily as needed (muscle spasm).    [provider]  busPIRone (BUSPAR) 10 MG tablet Take 25 mg by mouth 2 (two) times daily.    [provider]  clopidogrel (PLAVIX) 75 MG tablet Take 75 mg by mouth daily.    [provider]  diclofenac Sodium (VOLTAREN) 1 % GEL Apply 4 g topically 4 (four) times daily as needed  (pain).    [provider]  docusate sodium (COLACE) 100 MG capsule Take 100 mg by mouth 2 (two) times daily as needed for mild constipation.    [provider]  Dulaglutide (TRULICITY) 1.5 MG/0.5ML SOPN Inject 3 mg into the skin once a week. Fridays    [provider]  empagliflozin (JARDIANCE) 10 MG TABS tablet Take 10 mg by mouth daily.    [provider]  esomeprazole (NEXIUM) 40 MG capsule Take 40 mg by mouth 2 (two) times daily before a meal.    [provider]  estradiol (ESTRACE) 0.1 MG/GM vaginal cream Place 1 Applicatorful vaginally See admin instructions. Three times a week    [provider]  famotidine (PEPCID) 20 MG tablet Take 20 mg by mouth 2 (two) times daily.    [provider]  feeding supplement (ENSURE ENLIVE / ENSURE PLUS) LIQD Take 237 mLs by mouth 2 (two) times daily between meals. 08/27/21   Lorin Glass, MD  FLUoxetine (PROZAC) 40 MG capsule Take 80 mg by mouth daily.    [provider]  fluticasone (FLONASE) 50 MCG/ACT nasal spray Place 1 spray into both nostrils daily as needed for allergies or rhinitis.    [provider]  gabapentin (NEURONTIN) 300 MG capsule Take 300 mg by mouth 3 (three) times daily.    [provider]  guaiFENesin (MUCINEX) 600 MG 12 hr tablet Take 600 mg by mouth daily as needed for cough.    [provider]  hydrocortisone (ANUSOL-HC) 25 MG suppository Place 25 mg rectally 3 (three) times daily as needed for hemorrhoids or anal itching.    [provider]  hydrOXYzine (ATARAX) 10 MG tablet Take 10 mg by mouth 2 (two) times daily.    [provider]  ibuprofen (ADVIL) 200 MG tablet Take 600 mg by mouth every 8 (eight) hours as needed for moderate pain.    [provider]  insulin glargine, 1 Unit Dial, (TOUJEO SOLOSTAR) 300 UNIT/ML Solostar Pen Inject 20 Units into the skin at bedtime.    [provider]  insulin  lispro (HUMALOG) 100 UNIT/ML KwikPen Inject 0-9 Units into the skin 3 (three) times daily. Sliding scale: less than 150 -0 units 150-200 - 2 units 201-250 - 3 units 251-300 - 4 units 301-350 - 5 units 351-400 - 6 units 401-450 - 7 units 451-500 - 8 units 501-550- 9 units Greater than 550 notify provider.    [provider]  ipratropium-albuterol (DUONEB) 0.5-2.5 (3) MG/3ML SOLN Take 3 mLs by nebulization every 6 (six) hours as needed (wheezing).    [provider]  Lactobacillus Rhamnosus, GG, (CULTURELLE HEALTH & WELLNESS) CAPS Take 1 capsule by mouth daily. 08/05/21   [provider]  lidocaine 4 % Place 1 patch onto the skin daily as needed (pain).    [provider]  lisinopril (ZESTRIL) 5 MG tablet Take 5 mg by mouth daily.    [provider]  loperamide (IMODIUM) 2 MG capsule Take 2 mg by mouth daily as needed for diarrhea or loose stools.    [provider]  loratadine (CLARITIN) 10 MG tablet Take 10 mg by mouth daily as needed for allergies.    [provider]  meclizine (ANTIVERT) 25 MG tablet Take 25 mg by mouth 4 (four) times daily as needed for dizziness.    [provider]  metFORMIN (GLUCOPHAGE) 1000 MG tablet Take 1,000 mg by mouth 2 (two) times daily with a meal.    [provider]  Multiple Vitamin (MULTIVITAMIN WITH MINERALS) TABS tablet Take 1 tablet by mouth daily.    [provider]  nystatin cream (MYCOSTATIN) Apply 1 application. topically 4 (four) times daily as needed for dry skin.    [provider]  ondansetron (ZOFRAN-ODT) 4 MG disintegrating tablet Take 4 mg by mouth every 8 (eight) hours as needed for nausea or vomiting.    [provider]  Phenylephrine-Acetaminophen (TYLENOL SINUS+HEADACHE) 5-325 MG TABS Take 2 tablets by mouth daily as needed (headache).    [provider]  polyethylene glycol (MIRALAX / GLYCOLAX) 17 g packet Take 17 g by mouth  daily as needed for moderate constipation.    [provider]  potassium chloride SA (KLOR-CON M) 20 MEQ tablet Take 20 mEq by mouth daily.    [provider]  promethazine (PHENERGAN) 12.5 MG tablet Take 1 tablet (12.5 mg total) by mouth every 6 (six) hours as needed for nausea or vomiting. Patient not taking: Reported on 08/19/2021 06/25/14   Richarda Overlie, MD  rOPINIRole (REQUIP) 0.25 MG tablet Take 0.25 mg by mouth at bedtime.    [provider]  rosuvastatin (CRESTOR) 20 MG tablet Take 20 mg by mouth daily.  [provider]  senna (SENOKOT) 8.6 MG TABS tablet Take 2 tablets by mouth daily as needed for mild constipation.    [provider]  sucralfate (CARAFATE) 1 GM/10ML suspension Take 1 g by mouth 4 (four) times daily as needed (stomch ulcer).    [provider]  topiramate (TOPAMAX) 25 MG tablet Take 25 mg by mouth daily.    [provider]  traZODone (DESYREL) 50 MG tablet Take 25-50 mg by mouth at bedtime as needed for sleep.    [provider]      Allergies    Bacitracin, Citalopram, Doxycycline, Morphine and related, Neomycin, Ciprofloxacin, Ciprofloxacin-dexamethasone, Duloxetine, Latex, Levaquin [levofloxacin in d5w], Piper, Salvia officinalis, Soma [carisoprodol], and Lasix [furosemide]    Review of Systems   Review of Systems  Constitutional:  Positive for fever.  Musculoskeletal:  Positive for arthralgias.  Skin:  Positive for wound.    Physical Exam Updated Vital Signs BP (!) 121/101   Pulse 77   Temp 98.2 F (36.8 C) (Oral)   Resp 18   SpO2 95%  Physical Exam CONSTITUTIONAL: Elderly and anxious HEAD: Normocephalic/atraumatic EYES: EOMI ENMT: Mucous membranes moist NECK: supple no meningeal signs CV: S1/S2 noted, no murmurs/rubs/gallops noted LUNGS: Lungs are clear to auscultation bilaterally, no apparent distress ABDOMEN: soft, nontender NEURO: Pt is awake/alert/appropriate, moves all  extremitiesx4.  No facial droop.   EXTREMITIES: Both feet are warm to touch.  Diffuse tenderness noted to the left foot.  No obvious crepitus.  There is erythema and warmth.  See photo.  Distal pulses found by Doppler SKIN: warm, see photo PSYCH: Anxious      Patient gave verbal permission to utilize photo for medical documentation only The image was not stored on any personal device ED Results / Procedures / Treatments   Labs (all labs ordered are listed, but only abnormal results are displayed) Labs Reviewed  COMPREHENSIVE METABOLIC PANEL - Abnormal; Notable for the following components:      Result Value   Glucose, Bld 151 (*)    All other components within normal limits  CBC WITH DIFFERENTIAL/PLATELET - Abnormal; Notable for the following components:   WBC 11.1 (*)    Hemoglobin 11.2 (*)    HCT 35.8 (*)    Platelets 532 (*)    All other components within normal limits  CBG MONITORING, ED - Abnormal; Notable for the following components:   Glucose-Capillary 126 (*)    All other components within normal limits  CULTURE, BLOOD (ROUTINE X 2)  CULTURE, BLOOD (ROUTINE X 2)  LACTIC ACID, PLASMA  LACTIC ACID, PLASMA  PROTIME-INR    EKG None  Radiology No results found.  Procedures Procedures    Medications Ordered in ED Medications  vancomycin (VANCOREADY) IVPB 1500 mg/300 mL (1,500 mg Intravenous New Bag/Given 10/06/21 0242)  piperacillin-tazobactam (ZOSYN) IVPB 3.375 g (has no administration in time range)  oxyCODONE-acetaminophen (PERCOCET/ROXICET) 5-325 MG per tablet 1 tablet (1 tablet Oral Given 10/05/21 1541)  oxyCODONE-acetaminophen (PERCOCET/ROXICET) 5-325 MG per tablet 1 tablet (1 tablet Oral Given 10/05/21 2156)  fentaNYL (SUBLIMAZE) injection 100 mcg (100 mcg Intravenous Given 10/06/21 0157)  fentaNYL (SUBLIMAZE) injection 100 mcg (100 mcg Intravenous Given 10/06/21 0340)    ED Course/ Medical Decision Making/ A&P Clinical Course as of 10/06/21 0340  Fri Oct 06, 2021  0212 Patient presents with increasing pain in her left foot.  Patient has necrotic wounds on the foot that appear to be secondarily infected.  She is already scheduled  for a left BKA next week.  However due to evidence of infection patient will need to be admitted for IV antibiotic [DW]  0340 Lactic acid negative, less likely sepsis.  Vital signs appropriate.  However she would benefit from admission for pain control as well as IV antibiotic [DW]  0340 Discussed with Dr. Arlean Hopping  for admission [DW]    Clinical Course User Index [DW] Zadie Rhine, MD                           Medical Decision Making Amount and/or Complexity of Data Reviewed Labs: ordered.  Risk Prescription drug management.   This patient presents to the ED for concern of foot pain, this involves an extensive number of treatment options, and is a complaint that carries with it a high risk of complications and morbidity.  The differential diagnosis includes but is not limited to cellulitis, abscess, fracture, necrotizing fasciitis  Comorbidities that complicate the patient evaluation: Patient's presentation is complicated by their history of diabetes  Additional history obtained: Records reviewed  podiatry records reviewed patient with recent left fifth metatarsal resection  Lab Tests: I Ordered, and personally interpreted labs.  The pertinent results include: Leukocytosis   Medicines ordered and prescription drug management: I ordered medication including fentanyl for pain Vancomycin for presumed infection Reevaluation of the patient after these medicines showed that the patient    improved   Critical Interventions:  IV antibiotic  Consultations Obtained: I requested consultation with the admitting physician [facility] , and discussed  findings as well as pertinent plan - they recommend: Admission  Reevaluation: After the interventions noted above, I reevaluated the patient and found that  they have :improved  Complexity of problems addressed: Patient's presentation is most consistent with  acute presentation with potential threat to life or bodily function  Disposition: After consideration of the diagnostic results and the patient's response to treatment,  I feel that the patent would benefit from admission   .           Final Clinical Impression(s) / ED Diagnoses Final diagnoses:  Cellulitis of left foot    Rx / DC Orders ED Discharge Orders     None         Zadie Rhine, MD 10/06/21 [phone]

## 2021-10-06 NOTE — Progress Notes (Signed)
Pharmacy Antibiotic Note  Anna [name] is a 66 y.o. female admitted on 10/05/2021 with  LLE cellulitis .  Pharmacy has been consulted for Vancomycin dosing.  Vancomycin 1500 mg IV given in ED at  0245  Plan: Vancomycin 1250 mg IV q24h     Temp (24hrs), Avg:98.2 F (36.8 C), Min:98 F (36.7 C), Max:98.4 F (36.9 C)  Recent Labs  Lab 10/05/21 1437 10/06/21 0206  WBC 11.1*  --   CREATININE 0.96  --   LATICACIDVEN 1.6 0.9    CrCl cannot be calculated (Unknown ideal weight.).    Allergies  Allergen Reactions   Bacitracin     Other reaction(s): Other Burns skin     Citalopram Diarrhea   Doxycycline Other (See Comments)    Other reaction(s): Other "burning" all over body     Morphine And Related Hives   Neomycin     Other reaction(s): Other (See Comments), Unknown unknown    Ciprofloxacin     Tongue swells   Ciprofloxacin-Dexamethasone Other (See Comments)    unknown    Duloxetine     Other reaction(s): Mental Status Changes (intolerance) "saw pictures of gun"   Latex Itching   Levaquin [Levofloxacin In D5w] Other (See Comments)    Pt does not remember reaction but states she's had issues with med   Piper     Other reaction(s): Other (See Comments) White pepper- uncontrollable sneezing    Salvia Officinalis     Other reaction(s): Other (See Comments) Sneezing   Soma [Carisoprodol]     Sleepy and constipation   Lasix [Furosemide] Rash     Eddie Candle 10/06/2021 4:55 AM

## 2021-10-06 NOTE — Consult Note (Signed)
WOC Nurse Consult Note: Patient receiving care in [facility] ED13. Reason for Consult: "lower extremity wound" Per the chart review, [name] has planned for next week a left BKA.  The condition of the patient's left foot exceeds the scope of practice of the WOC nurse. Please direct all wound care/extremity care questions or concerns to [name] or his associate.  WOC nurse will not follow at this time.  Please re-consult the WOC team if needed.  Helmut Muster, RN, MSN, CWOCN, CNS-BC, pager [phone]

## 2021-10-06 NOTE — H&P (Signed)
History and Physical    Patient: Anna [name] ZOX:[id number] DOB: 03-Dec-1955 DOA: 10/05/2021 DOS: the patient was seen and examined on 10/06/2021 PCP: Janifer Adie, MD [name] Patient coming from: Home - lives with son, granddaughter, and great-granddaughter; NOK: Son, [phone]   Chief Complaint: L foot infection  HPI: Anna [name] is a 66 y.o. female with medical history significant of DM, HTN, and HLD presenting with L foot infection. She is scheduled for L BKA next week (7/18).  For the last 2-3 weeks, she can't sleep and is having ongoing pain.  2-3 weeks ago, the doctor took out the stitches - she had toe amputation and it was still hurting so "he cut the toe down" and it hurt terribly.  The OR note reports wound debridement on 6/1 and it was still hurting terribly.  She saw Dr. Sharol Given on 7/6 and he offered BKA.  She was offered BKA on 7/19.  She has been going to [facility] and it has been getting worse every day.  She is noticing erythema along the foot.  She has been mildly nauseated, unable to eat much.  She would like to go to [employer] or [place] post-operatively.    ER Course:  Carryover, per Dr. Velia Meyer:  This is a 66 year old female with diabetes and poorly healing left foot wound status post partial amputation of left fifth metatarsal, who is being admitted with cellulitis of the left foot.    She is being followed by Dr. Sharol Given of orthopedic surgery, and is scheduled for left BKA next week.  However, she presents this evening complaining of significant increase in left foot swelling, tenderness, erythema over the last few days.  This is also been associated the onset subjective fever/chills over the last few days.  Presenting labs notable for mild leukocytosis.  Otherwise, no SIRS criteria currently met.  Lactate nonelevated.  She reportedly has pulses to the distal left lower extremity by Doppler, with the left lower extremity noted to be warm on exam.     Blood cultures x2 pending, given vancomycin and Zosyn.   Of note, Case has not yet been discussed with orthopedic surgery.  In the context of current n.p.o. status, I have ordered Accu-Cheks on a every 6 hours basis.     Review of Systems: As mentioned in the history of present illness. All other systems reviewed and are negative. Past Medical History:  Diagnosis Date   Arthritis    Asthma    Back pain    Bronchitis    Bursitis    Diabetes mellitus    Diabetic neuropathy (HCC)    Diabetic retinopathy    Hyperlipemia    Hypertension    Migraine    Obesity    Sepsis due to urinary tract infection [facility])    Past Surgical History:  Procedure Laterality Date   ABDOMINAL AORTOGRAM W/LOWER EXTREMITY Left 08/22/2021   Procedure: ABDOMINAL AORTOGRAM W/LOWER EXTREMITY;  Surgeon: Serafina Mitchell, MD;  Location: [place] CV LAB;  Service: Cardiovascular;  Laterality: Left;   CESAREAN SECTION     x3   CHOLECYSTECTOMY     EYE SURGERY Left    METATARSAL HEAD EXCISION Left 08/24/2021   Procedure: METATARSAL HEAD EXCISION FIFTH TOE;  Surgeon: Trula Slade, DPM;  Location: [place];  Service: Podiatry;  Laterality: Left;   PERIPHERAL VASCULAR BALLOON ANGIOPLASTY  08/22/2021   Procedure: PERIPHERAL VASCULAR BALLOON ANGIOPLASTY;  Surgeon: Serafina Mitchell, MD;  Location: [facility]  INVASIVE CV LAB;  Service: Cardiovascular;;  Left AT   WOUND DEBRIDEMENT Left 08/24/2021   Procedure: DEBRIDEMENT WOUND OF LEFT FOOT;  Surgeon: Trula Slade, DPM;  Location: [place];  Service: Podiatry;  Laterality: Left;   Social History:  reports that she has never smoked. She has never used smokeless tobacco. She reports that she does not drink alcohol and does not use drugs.  Allergies  Allergen Reactions   Bacitracin     Other reaction(s): Other Burns skin     Citalopram Diarrhea   Doxycycline Other (See Comments)    Other reaction(s): Other "burning" all over body     Morphine And Related Hives    Neomycin     Other reaction(s): Other (See Comments), Unknown unknown    Ciprofloxacin     Tongue swells   Ciprofloxacin-Dexamethasone Other (See Comments)    unknown    Duloxetine     Other reaction(s): Mental Status Changes (intolerance) "saw pictures of gun"   Latex Itching   Levaquin [Levofloxacin In D5w] Other (See Comments)    Pt does not remember reaction but states she's had issues with med   Piper     Other reaction(s): Other (See Comments) White pepper- uncontrollable sneezing    Salvia Officinalis     Other reaction(s): Other (See Comments) Sneezing   Soma [Carisoprodol]     Sleepy and constipation   Lasix [Furosemide] Rash    History reviewed. No pertinent family history.  Prior to Admission medications   Medication Sig Start Date End Date Taking? Authorizing Provider  acetaminophen (TYLENOL) 500 MG tablet Take 1,000 mg by mouth every 6 (six) hours as needed for moderate pain.   Yes [provider]  albuterol (VENTOLIN HFA) 108 (90 Base) MCG/ACT inhaler Inhale 1-2 puffs into the lungs every 6 (six) hours as needed for wheezing or shortness of breath.   Yes [provider]  ALPRAZolam Duanne Moron) 1 MG tablet Take 1 mg by mouth 3 (three) times daily as needed for anxiety.   Yes [provider]  aspirin EC 81 MG tablet Take 1 tablet (81 mg total) by mouth daily. Swallow whole. 08/28/21 11/26/21 Yes Dahal, Marlowe Aschoff, MD  busPIRone (BUSPAR) 10 MG tablet Take 25 mg by mouth 2 (two) times daily.   Yes [provider]  clopidogrel (PLAVIX) 75 MG tablet Take 75 mg by mouth daily.   Yes [provider]  diclofenac Sodium (VOLTAREN) 1 % GEL Apply 4 g topically 4 (four) times daily as needed (pain).   Yes [provider]  docusate sodium (COLACE) 100 MG capsule Take 100 mg by mouth 2 (two) times daily as needed for mild constipation.   Yes [provider]  Dulaglutide (TRULICITY) 1.5 ZO/1.0RU SOPN Inject 3 mg into the skin once  a week. Fridays   Yes [provider]  empagliflozin (JARDIANCE) 10 MG TABS tablet Take 10 mg by mouth daily.   Yes [provider]  esomeprazole (NEXIUM) 40 MG capsule Take 40 mg by mouth 2 (two) times daily before a meal.   Yes [provider]  estradiol (ESTRACE) 0.1 MG/GM vaginal cream Place 1 Applicatorful vaginally See admin instructions. Three times a week   Yes [provider]  ferrous sulfate 325 (65 FE) MG tablet Take 325 mg by mouth daily with breakfast.   Yes [provider]  FLUoxetine (PROZAC) 40 MG capsule Take 80 mg by mouth daily.   Yes [provider]  fluticasone (FLONASE) 50  MCG/ACT nasal spray Place 1 spray into both nostrils daily as needed for allergies or rhinitis.   Yes [provider]  gabapentin (NEURONTIN) 300 MG capsule Take 300 mg by mouth 3 (three) times daily.   Yes [provider]  guaiFENesin (MUCINEX) 600 MG 12 hr tablet Take 600 mg by mouth daily as needed for cough.   Yes [provider]  hydrOXYzine (ATARAX) 10 MG tablet Take 10 mg by mouth 2 (two) times daily.   Yes [provider]  ibuprofen (ADVIL) 200 MG tablet Take 600 mg by mouth every 8 (eight) hours as needed for moderate pain.   Yes [provider]  insulin glargine, 1 Unit Dial, (TOUJEO SOLOSTAR) 300 UNIT/ML Solostar Pen Inject 25 Units into the skin at bedtime.   Yes [provider]  insulin lispro (HUMALOG) 100 UNIT/ML KwikPen Inject 2-9 Units into the skin 3 (three) times daily. Sliding scale: less than 150 -0 units 150-200 - 2 units 201-250 - 3 units 251-300 - 4 units 301-350 - 5 units 351-400 - 6 units 401-450 - 7 units 451-500 - 8 units 501-550- 9 units Greater than 550 notify provider.   Yes [provider]  Lactobacillus Rhamnosus, GG, ([place]) CAPS Take 1 capsule by mouth daily. 08/05/21  Yes [provider]  lidocaine 4 % Place 1 patch onto  the skin daily as needed (pain).   Yes [provider]  lisinopril (ZESTRIL) 5 MG tablet Take 5 mg by mouth daily.   Yes [provider]  loperamide (IMODIUM) 2 MG capsule Take 2 mg by mouth daily as needed for diarrhea or loose stools.   Yes [provider]  loratadine (CLARITIN) 10 MG tablet Take 10 mg by mouth daily as needed for allergies.   Yes [provider]  meclizine (ANTIVERT) 25 MG tablet Take 25 mg by mouth 4 (four) times daily as needed for dizziness.   Yes [provider]  metFORMIN (GLUCOPHAGE) 1000 MG tablet Take 1,000 mg by mouth 2 (two) times daily with a meal.   Yes [provider]  methocarbamol (ROBAXIN) 500 MG tablet Take 500 mg by mouth 2 (two) times daily as needed for muscle spasms.   Yes [provider]  Multiple Vitamin (MULTIVITAMIN WITH MINERALS) TABS tablet Take 1 tablet by mouth daily.   Yes [provider]  nystatin cream (MYCOSTATIN) Apply 1 application. topically 4 (four) times daily as needed for dry skin.   Yes [provider]  ondansetron (ZOFRAN) 4 MG tablet Take 4 mg by mouth every 8 (eight) hours as needed for nausea or vomiting.   Yes [provider]  ondansetron (ZOFRAN-ODT) 4 MG disintegrating tablet Take 4 mg by mouth every 8 (eight) hours as needed for nausea or vomiting.   Yes [provider]  oxycodone (OXY-IR) 5 MG capsule Take 5-10 mg by mouth every 6 (six) hours as needed for pain.   Yes [provider]  polyethylene glycol (MIRALAX / GLYCOLAX) 17 g packet Take 17 g by mouth daily as needed for moderate constipation.   Yes [provider]  potassium chloride SA (KLOR-CON M) 20 MEQ tablet Take 20 mEq by mouth daily.   Yes [provider]  rOPINIRole (REQUIP) 0.25 MG tablet Take 0.25 mg by mouth at bedtime.   Yes [provider]  sucralfate (CARAFATE) 1 GM/10ML suspension Take 1 g by mouth 4 (four) times daily as needed  (stomch ulcer).   Yes  [provider]  topiramate (TOPAMAX) 25 MG tablet Take 25 mg by mouth daily.   Yes [provider]  naloxone (NARCAN) nasal spray 4 mg/0.1 mL Place 1 spray into the nose once as needed (overdose).    [provider]  promethazine (PHENERGAN) 12.5 MG tablet Take 1 tablet (12.5 mg total) by mouth every 6 (six) hours as needed for nausea or vomiting. Patient not taking: Reported on 08/19/2021 06/25/14   Reyne Dumas, MD    Physical Exam: Vitals:   10/06/21 1315 10/06/21 1330 10/06/21 1400 10/06/21 1430  BP: (!) 145/65 139/65 (!) 152/79 123/75  Pulse: 79 78 78 77  Resp: 19 14 11  (!) 25  Temp:      TempSrc:      SpO2: 98% 97% 97% 97%  Weight:      Height:       General:  Appears older than stated age, frail Eyes:  PERRL, EOMI, normal lids, iris ENT:  grossly normal hearing, lips & tongue, mmm Neck:  no LAD, masses or thyromegaly Cardiovascular:  RRR, no m/r/g. No LE edema.  Respiratory:   CTA bilaterally with no wheezes/rales/rhonchi.  Normal respiratory effort. Abdomen:  soft, NT, ND, NABS Skin:  necrosis of L lateral foot with ulcer at base of medial great toe, surrounding mostly plantar erythema Musculoskeletal:  grossly normal tone BUE/BLE,  s/p L 5th metatarsal amputation Lower extremity:  See photos below, diminished but present pulses on LLE      Psychiatric:  blutned mood and affect, speech fluent and appropriate, AOx3 Neurologic:  CN 2-12 grossly intact, moves all extremities in coordinated fashion   Radiological Exams on Admission: Independently reviewed - see discussion in A/P where applicable  No results found.  EKG: Independently reviewed.  NSR with rate 75; no evidence of acute ischemia   Labs on Admission: I have personally reviewed the available labs and imaging studies at the time of the admission.  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 [facility]) Active Problems:   Essential hypertension   Type 2 diabetes mellitus (HCC)   OSA on CPAP   Dyslipidemia   Mood disorder (HCC)    Critical limb ischemia with gangrene of LLE -Patient with prior toe amputation, admitted last month and underwent debridement of the foot and metatarsal head excision -She has had suboptimal wound healing since  -She had prior angiogram of the leg on 5/30 and it showed severe tibial disease with limited options for revascularization -She was already scheduled for BKA on 7/19 -Will admit to Med Surg -Will treat with IV antibiotics (Rocephin/Flagyl/Vanc as per the lower extremity wound algorithm) -Orthopedics will consult -Vascular surgery was notified of admission but previously noted limited revascularization options -LE wound order set utilized including labs (CRP, ESR, A1c, prealbumin, HIV, and blood cultures) and consults (diabetes coordinator; peripheral vascular navigator; TOC team; wound care; and nutrition)  -Continue ASA, Plavix -Continue robaxin, Oxy IR  DM -Will check Q2I -hold Trulicity, Jardiance, metformin -Continue glargine -Cover with moderate-scale SSI  -Continue Neurontin  HTN -Continue lisinopril  HLD -Not currently taking Crestor due to elevated LFTs  Mood d/o -Continue Xanax, buspirone, fluoxetine, hydroxyzine, Topamax  OSA -Continue CPAP    Advance Care Planning:   Code Status: Full Code   Consults: Orthopedics; diabetes coordinator; peripheral vascular navigator; TOC  team; wound care; and nutrition  DVT Prophylaxis: Lovenox  Family Communication: None present; she is capable of communicating with family at this time  Severity of Illness: The appropriate patient status for this patient is INPATIENT. Inpatient status is judged to be reasonable and necessary in order to provide the required intensity of  service to ensure the patient's safety. The patient's presenting symptoms, physical exam findings, and initial radiographic and laboratory data in the context of their chronic comorbidities is felt to place them at high risk for further clinical deterioration. Furthermore, it is not anticipated that the patient will be medically stable for discharge from the hospital within 2 midnights of admission.   * I certify that at the point of admission it is my clinical judgment that the patient will require inpatient hospital care spanning beyond 2 midnights from the point of admission due to high intensity of service, high risk for further deterioration and high frequency of surveillance required.*  Author: Karmen Bongo, MD 10/06/2021 6:24 PM  For on call review [email or web address].

## 2021-10-06 NOTE — ED Notes (Signed)
Kirt Boys granddaughter [phone] requesting an update on the patient

## 2021-10-06 NOTE — ED Notes (Signed)
Vascular at bedside at this time.

## 2021-10-06 NOTE — Progress Notes (Signed)
  Carryover admission to the Day Admitter.  I discussed this case with the EDP, Dr. Christy Gentles.  Per these discussions:   This is a 66 year old female with diabetes and poorly healing left foot wound status post partial amputation of left fifth metatarsal, who is being admitted with cellulitis of the left foot.   She is being followed by Dr. Sharol Given of orthopedic surgery, and is scheduled for left BKA next week.  However, she presents this evening complaining of significant increase in left foot swelling, tenderness, erythema over the last few days.  This is also been associated the onset subjective fever/chills over the last few days.  Presenting labs notable for mild leukocytosis.  Otherwise, no SIRS criteria currently met.  Lactate nonelevated.  She reportedly has pulses to the distal left lower extremity by Doppler, with the left lower extremity noted to be warm on exam.   Blood cultures x2 were collected and the patient has received doses of IV vancomycin and Zosyn in the ED.  I have placed an order for for inpatient admission to a med telemetry for further evaluation management of left foot cellulitis.  I have placed some additional preliminary admit orders via the adult multi-morbid admission order set. I have also ordered as needed IV Dilaudid.  N.p.o. with ice chips/sips with meds for (in case patient's left bka can be expedited).  Of note, Case has not yet been discussed with orthopedic surgery.  In the context of current n.p.o. status, I have ordered Accu-Cheks on a every 6 hours basis with associated sliding scale insulin.  Additionally, I have ordered a fresh set of morning labs.    Babs Bertin, DO Hospitalist

## 2021-10-06 NOTE — Inpatient Diabetes Management (Signed)
Inpatient Diabetes Program Recommendations  AACE/ADA: New Consensus Statement on Inpatient Glycemic Control (2015)  Target Ranges:  Prepandial:   less than 140 mg/dL      Peak postprandial:   less than 180 mg/dL (1-2 hours)      Critically ill patients:  140 - 180 mg/dL   Lab Results  Component Value Date   GLUCAP 124 (H) 10/06/2021   HGBA1C 8.2 (H) 08/20/2021    Review of Glycemic Control  Diabetes history: DM 2 Outpatient Diabetes medications: Metformin 1000 mg bid, Toujeo 30 units qhs, Trulicity 3 mg, Jardiance 10 mg Daily, Humalog SSI 2-9 units tid Current orders for Inpatient glycemic control:  Semglee 25 units qhs Novolog 0-15 units tid + hs  A1c 8.2% on 5/28  Inpatient Diabetes Program Recommendations:    Attempted to speak with pt regarding A1c and glucose control at home. Pt in severe pain holding onto her foot requesting for pain medication and her home Robaxin. Messaged Dr. Ophelia Charter and Pts nurse. Glucose trends currently at inpatient goal. Will continue to monitor outpt. SW  Thanks,  Christena Deem RN, MSN, BC-ADM Inpatient Diabetes Coordinator Team Pager [phone] (8a-5p)

## 2021-10-07 DIAGNOSIS — E1140 Type 2 diabetes mellitus with diabetic neuropathy, unspecified: Secondary | ICD-10-CM | POA: Diagnosis not present

## 2021-10-07 DIAGNOSIS — E08319 Diabetes mellitus due to underlying condition with unspecified diabetic retinopathy without macular edema: Secondary | ICD-10-CM | POA: Diagnosis not present

## 2021-10-07 DIAGNOSIS — I70262 Atherosclerosis of native arteries of extremities with gangrene, left leg: Secondary | ICD-10-CM | POA: Diagnosis not present

## 2021-10-07 DIAGNOSIS — I96 Gangrene, not elsewhere classified: Secondary | ICD-10-CM | POA: Diagnosis not present

## 2021-10-07 LAB — GLUCOSE, CAPILLARY
Glucose-Capillary: 167 mg/dL — ABNORMAL HIGH (ref 70–99)
Glucose-Capillary: 121 mg/dL — ABNORMAL HIGH (ref 70–99)
Glucose-Capillary: 105 mg/dL — ABNORMAL HIGH (ref 70–99)
Glucose-Capillary: 125 mg/dL — ABNORMAL HIGH (ref 70–99)

## 2021-10-07 LAB — SURGICAL PCR SCREEN
MRSA, PCR: NEGATIVE
Staphylococcus aureus: NEGATIVE

## 2021-10-07 MED ORDER — OXYCODONE HCL ER 10 MG PO T12A
10.0000 mg | EXTENDED_RELEASE_TABLET | Freq: Two times a day (BID) | ORAL | Status: DC
  Administered 2021-10-07 – 2021-10-08 (×2): 10 mg via ORAL
  Filled 2021-10-07 (×2): qty 1

## 2021-10-07 MED ORDER — ASCORBIC ACID 500 MG PO TABS
500.0000 mg | ORAL_TABLET | Freq: Every day | ORAL | Status: DC
  Administered 2021-10-07 – 2021-10-11 (×5): 500 mg via ORAL
  Filled 2021-10-07 (×5): qty 1

## 2021-10-07 MED ORDER — ADULT MULTIVITAMIN W/MINERALS CH
1.0000 | ORAL_TABLET | Freq: Every day | ORAL | Status: DC
  Administered 2021-10-07 – 2021-10-13 (×7): 1 via ORAL
  Filled 2021-10-07 (×8): qty 1

## 2021-10-07 MED ORDER — OXYCODONE HCL 5 MG PO TABS
5.0000 mg | ORAL_TABLET | Freq: Four times a day (QID) | ORAL | Status: DC | PRN
  Administered 2021-10-07 – 2021-10-09 (×4): 5 mg via ORAL
  Filled 2021-10-07 (×4): qty 1

## 2021-10-07 MED ORDER — ZINC SULFATE 220 (50 ZN) MG PO CAPS
220.0000 mg | ORAL_CAPSULE | Freq: Every day | ORAL | Status: DC
  Administered 2021-10-07 – 2021-10-11 (×5): 220 mg via ORAL
  Filled 2021-10-07 (×5): qty 1

## 2021-10-07 NOTE — Progress Notes (Signed)
Preoperative MRSA Screening sent to lab

## 2021-10-07 NOTE — Progress Notes (Signed)
PROGRESS NOTE Anna [name]  ENI:[id number] DOB: [birth year] DOA: 10/05/2021 PCP: Jethro Bastos, MD   Brief Narrative/Hospital Course: 66 y.o.f w/ history significant of DM, HTN, and HLD presented with L foot infection. She is scheduled for L BKA next week (7/18) by Dr Lajoyce Corners but presented to the ED with significant increase in her left foot swelling tenderness redness along with fever subjective, chills.  In the ED BMP stable CBC with mild leukocytosis lactic acid 1.6 chronic anemia, thrombocytosis 532, sed rate 84. Patient was admitted for amputation.    Subjective: Patient seen and examined. Resting comfortably   Assessment and Plan: Principal Problem:   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 [facility]) Active Problems:   Essential hypertension   Type 2 diabetes mellitus (HCC)   OSA on CPAP   Dyslipidemia   Mood disorder (HCC)   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, with surrounding cellulitis PVD: Patient followed by [name]- was scheduled for outpatient BKA 7/18 but presented with worsening infection.  Had erythema on the foot on admission which is improving, continue with empiric antibiotic Rocephin, vancomycin, Flagyl along with home aspirin and Plavix.  Continue pain control with opiates, muscle relaxant, Neurontin.  Vascular surgery had seen, at this time she has a nonsalvageable left foot and will require left BKA, likely anticipating amputation on Monday as [name] is not available until Wednesday.  Essential hypertension: Well-controlled on lisinopril T2DM W/ diabetic neuropathy: Blood sugar well controlled, continue SSI, Semglee 25 units hold for or Recent Labs  Lab 10/06/21 1255 10/06/21 1746 10/06/21 2107 10/07/21 0907 10/07/21 1128  GLUCAP 115* 124* 125* 167* 121*    OSA on CPAP-continue the same Dyslipidemia-not taking Crestor currently due to elevated LFTs. Mood disorder: Mood is stable continue home BuSpar,  Requip, Prozac, Neurontin, Atarax and Topamax Hepatic steatosis monitor lfts. COPD-cont nebs Chronic anemia-monitor Overweight with Body mass index is 29.26 kg/m. : will benefit with weight loss. RLS/Anxiety:cont home meds    DVT prophylaxis: enoxaparin (LOVENOX) injection 40 mg Start: 10/06/21 1000 Code Status:   Code Status: Full Code Family Communication: plan of care discussed with patient at bedside. Patient status is: Inpatient because of ongoing management of critical limb ischemia Level of care: Med-Surg   Dispo: The patient is from: home            Anticipated disposition: TBD, UNKNOWN  Mobility Assessment (last 72 hours)     Mobility Assessment     Row Name 10/07/21 1000 10/06/21 2045         Does patient have an order for bedrest or is patient medically unstable No - Continue assessment No - Continue assessment      What is the highest level of mobility based on the progressive mobility assessment? Level 2 (Chairfast) - Balance while sitting on edge of bed and cannot stand Level 2 (Chairfast) - Balance while sitting on edge of bed and cannot stand      Is the above level different from baseline mobility prior to current illness? Yes - Recommend PT order Yes - Recommend PT order                Objective: Vitals last 24 hrs: Vitals:   10/06/21 2047 10/07/21 0028 10/07/21 0500 10/07/21 0804  BP: (!) 163/69 (!) 116/52 (!) 163/67 (!) 107/53  Pulse: 75 74 80 69  Resp: 15 15 16 16   Temp: 98.7 F (37.1 C) 99.5 F (37.5 C) 99  F (37.2 C) 97.8 F (36.6 C)  TempSrc:   Oral   SpO2: 100% 100% 100% 93%  Weight:      Height:       Weight change:   Physical Examination: General exam: alert awake,older than stated age, weak appearing. HEENT:Oral mucosa moist, Ear/Nose WNL grossly, dentition normal. Respiratory system: bilaterally diminished BS, no use of accessory muscle Cardiovascular system: S1 & S2 +, No JVD. Gastrointestinal system: Abdomen soft,NT,ND,  BS+ Nervous System:Alert, awake, moving extremities and grossly nonfocal Extremities: LE edema neg, left foot with area of gangrene surrounding erythema better Skin: No rashes,no icterus. MSK: Normal muscle bulk,tone, power  Medications reviewed:  Scheduled Meds:  vitamin C  500 mg Oral Daily   aspirin EC  81 mg Oral Daily   busPIRone  25 mg Oral BID   [START ON 10/11/2021] chlorhexidine  60 mL Topical Once   clopidogrel  75 mg Oral Daily   docusate sodium  100 mg Oral BID   enoxaparin (LOVENOX) injection  40 mg Subcutaneous Q24H   feeding supplement (GLUCERNA SHAKE)  237 mL Oral TID BM   FLUoxetine  80 mg Oral Daily   gabapentin  300 mg Oral TID   guaiFENesin  600 mg Oral BID   hydrOXYzine  10 mg Oral BID   insulin aspart  0-15 Units Subcutaneous TID WC   insulin aspart  0-5 Units Subcutaneous QHS   insulin glargine-yfgn  25 Units Subcutaneous QHS   lisinopril  5 mg Oral Daily   multivitamin with minerals  1 tablet Oral Daily   pantoprazole  40 mg Oral BID AC   [START ON 10/11/2021] povidone-iodine  2 Application Topical Once   rOPINIRole  0.25 mg Oral QHS   topiramate  25 mg Oral Daily   [START ON 10/11/2021] tranexamic acid (CYKLOKAPRON) 2,000 mg in sodium chloride 0.9 % 50 mL Topical Application  2,000 mg Topical To OR   zinc sulfate  220 mg Oral Daily   Continuous Infusions:  [START ON 10/11/2021]  ceFAZolin (ANCEF) IV     cefTRIAXone (ROCEPHIN)  IV 2 g (10/07/21 0956)   metronidazole 500 mg (10/07/21 1117)   vancomycin 1,000 mg (10/07/21 0422)      Diet Order             Diet heart healthy/carb modified Room service appropriate? Yes; Fluid consistency: Thin  Diet effective now                    Nutrition Problem: Increased nutrient needs Etiology: wound healing Signs/Symptoms: estimated needs Interventions: Glucerna shake, MVI   Intake/Output Summary (Last 24 hours) at 10/07/2021 1415 Last data filed at 10/07/2021 0500 Gross per 24 hour  Intake 240.44 ml   Output --  Net 240.44 ml   Net IO Since Admission: 790.74 mL [10/07/21 1415]  Wt Readings from Last 3 Encounters:  10/06/21 72.6 kg  08/19/21 73.5 kg  07/30/21 73.5 kg     Unresulted Labs (From admission, onward)    None     Data Reviewed: I have personally reviewed following labs and imaging studies CBC: Recent Labs  Lab 10/05/21 1437 10/06/21 0500  WBC 11.1* 11.5*  NEUTROABS 6.9 6.6  HGB 11.2* 10.4*  HCT 35.8* 34.5*  MCV 86.5 88.5  PLT 532* 512*   Basic Metabolic Panel: Recent Labs  Lab 10/05/21 1437 10/06/21 0500  NA 138 139  K 4.4 3.8  CL 100 101  CO2 24 25  GLUCOSE 151* 120*  BUN 17 15  CREATININE 0.96 0.98  CALCIUM 9.5 9.1  MG  --  2.2   GFR: Estimated Creatinine Clearance: 52.7 mL/min (by C-G formula based on SCr of 0.98 mg/dL). Liver Function Tests: Recent Labs  Lab 10/05/21 1437 10/06/21 0500  AST 23 22  ALT 16 15  ALKPHOS 71 65  BILITOT 0.6 0.2*  PROT 7.2 6.7  ALBUMIN 3.5 3.3*   No results for input(s): "LIPASE", "AMYLASE" in the last 168 hours. No results for input(s): "AMMONIA" in the last 168 hours. Coagulation Profile: Recent Labs  Lab 10/05/21 1437  INR 1.0   BNP (last 3 results) No results for input(s): "PROBNP" in the last 8760 hours. HbA1C: No results for input(s): "HGBA1C" in the last 72 hours. CBG: Recent Labs  Lab 10/06/21 1255 10/06/21 1746 10/06/21 2107 10/07/21 0907 10/07/21 1128  GLUCAP 115* 124* 125* 167* 121*   Lipid Profile: No results for input(s): "CHOL", "HDL", "LDLCALC", "TRIG", "CHOLHDL", "LDLDIRECT" in the last 72 hours. Thyroid Function Tests: No results for input(s): "TSH", "T4TOTAL", "FREET4", "T3FREE", "THYROIDAB" in the last 72 hours. Sepsis Labs: Recent Labs  Lab 10/05/21 1437 10/06/21 0206  LATICACIDVEN 1.6 0.9    Recent Results (from the past 240 hour(s))  Culture, blood (Routine x 2)     Status: None (Preliminary result)   Collection Time: 10/05/21  2:37 PM   Specimen: BLOOD   Result Value Ref Range Status   Specimen Description BLOOD BLOOD LEFT FOREARM  Final   Special Requests   Final    BOTTLES DRAWN AEROBIC AND ANAEROBIC Blood Culture adequate volume   Culture   Final    NO GROWTH 2 DAYS Performed at [facility], [place] [address] [place], [place] [address]    Report Status PENDING  Incomplete  Culture, blood (Routine x 2)     Status: None (Preliminary result)   Collection Time: 10/05/21  2:47 PM   Specimen: BLOOD  Result Value Ref Range Status   Specimen Description BLOOD SITE NOT SPECIFIED  Final   Special Requests   Final    BOTTLES DRAWN AEROBIC AND ANAEROBIC Blood Culture adequate volume   Culture   Final    NO GROWTH 2 DAYS Performed at [facility], [address], [place], [place] [address]    Report Status PENDING  Incomplete  Surgical pcr screen     Status: None   Collection Time: 10/07/21  5:40 AM   Specimen: Nasal Mucosa; Nasal Swab  Result Value Ref Range Status   MRSA, PCR NEGATIVE NEGATIVE Final   Staphylococcus aureus NEGATIVE NEGATIVE Final    Comment: (NOTE) The Xpert SA Assay (FDA approved for NASAL specimens in patients 96 years of age and older), is one component of a comprehensive surveillance program. It is not intended to diagnose infection nor to guide or monitor treatment. Performed at [facility], [place] [address], [place],  [address]     Antimicrobials: Anti-infectives (From admission, onward)    Start     Dose/Rate Route Frequency Ordered Stop   10/11/21 0600  ceFAZolin (ANCEF) IVPB 2g/100 mL premix        2 g 200 mL/hr over 30 Minutes Intravenous On call to O.R. 10/06/21 2011 10/12/21 0559   10/07/21 0600  vancomycin (VANCOREADY) IVPB 1250 mg/250 mL  Status:  Discontinued        1,250 mg 166.7 mL/hr over 90 Minutes Intravenous Every 24 hours 10/06/21 0458 10/06/21 1005   10/07/21  0300  vancomycin (VANCOCIN) IVPB 1000 mg/200 mL premix        1,000 mg 200 mL/hr over 60 Minutes  Intravenous Every 24 hours 10/06/21 1005     10/06/21 1000  cefTRIAXone (ROCEPHIN) 2 g in sodium chloride 0.9 % 100 mL IVPB        2 g 200 mL/hr over 30 Minutes Intravenous Every 24 hours 10/06/21 0849 10/13/21 0959   10/06/21 1000  metroNIDAZOLE (FLAGYL) IVPB 500 mg        500 mg 100 mL/hr over 60 Minutes Intravenous Every 12 hours 10/06/21 0849 10/13/21 0959   10/06/21 0315  piperacillin-tazobactam (ZOSYN) IVPB 3.375 g        3.375 g 100 mL/hr over 30 Minutes Intravenous  Once 10/06/21 0305 10/06/21 0610   10/06/21 0115  vancomycin (VANCOREADY) IVPB 1500 mg/300 mL        1,500 mg 150 mL/hr over 120 Minutes Intravenous  Once 10/06/21 0110 10/06/21 0454     Culture/Microbiology    Component Value Date/Time   SDES BLOOD SITE NOT SPECIFIED 10/05/2021 1447   SPECREQUEST  10/05/2021 1447    BOTTLES DRAWN AEROBIC AND ANAEROBIC Blood Culture adequate volume   CULT  10/05/2021 1447    NO GROWTH 2 DAYS Performed at [facility], [place] [address] [place], [place] [address]    REPTSTATUS PENDING 10/05/2021 1447  Radiology Studies: No results found.  LOS: 1 day  Antonieta Pert, MD [facility]  10/07/2021, 2:15 PM

## 2021-10-07 NOTE — Hospital Course (Addendum)
65 y.o.f w/ history significant of DM, HTN, and HLD presented with L foot infection. She is scheduled for L BKA next week (7/18) by Dr Lajoyce Corners but presented to the ED with significant increase in her left foot swelling tenderness redness along with fever subjective, chills.  In the ED BMP stable CBC with mild leukocytosis lactic acid 1.6 chronic anemia, thrombocytosis 532, sed rate 84.Patient was admitted for amputation.  Patient is having severe pain was added on OxyContin following which she was very drowsy.  Seen by vascular initially contemplating amputation on Monday but seen by Dr. Jason Nest is planning for transtibial amputation on Wednesday 7/19

## 2021-10-07 NOTE — Consult Note (Signed)
Vascular and Vein Specialist of [facility]  Patient name: Anna [name] MRN: [id number] DOB: October 07, 1955 Sex: female   REQUESTING PROVIDER:    ER   REASON FOR CONSULT:    Left foot wound  HISTORY OF PRESENT ILLNESS:   CHANTERIA [name] is a 66 y.o. female, who was initially evaluated by vascular surgery on 08/21/2021 for a poorly healing left fifth toe amputation as well as a great toe wound on the left.  She had undergone percutaneous intervention and amputation at [facility].  She was scheduled for below-knee amputation there but wanted a second opinion.  She underwent repeat angiography on 08/22/2021.  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  She had a palpable pulse.  She does not have any other options for revascularization she was admitted for pain in her foot.  She was offered a below-knee amputation by [name].  She began to notice some redness on the foot and return to the hospital.  She is scheduled for a below-knee amputation by [name] on Wednesday.  We were consulted to see if this could be done sooner.  She is now complaining of worsening swelling and pain.  Her white count was elevated to 11.5.  She is now on IV antibiotics  The patient is a diabetic with associated neuropathy and retinopathy.  She is on a statin for hypercholesterolemia.  She is medically managed for hypertension.  She is a non-smoker.  PAST MEDICAL HISTORY    Past Medical History:  Diagnosis Date   Arthritis    Asthma    Back pain    Bronchitis    Bursitis    Diabetes mellitus    Diabetic neuropathy (HCC)    Diabetic retinopathy    Hyperlipemia    Hypertension    Migraine    Obesity    Sepsis due to urinary tract infection (HCC)      FAMILY HISTORY   History reviewed. No pertinent family history.  SOCIAL HISTORY:   Social History    Socioeconomic History   Marital status: Divorced    Spouse name: Not on file   Number of children: Not on file   Years of education: Not on file   Highest education level: Not on file  Occupational History   Not on file  Tobacco Use   Smoking status: Never   Smokeless tobacco: Never  Substance and Sexual Activity   Alcohol use: No   Drug use: No   Sexual activity: Not on file  Other Topics Concern   Not on file  Social History Narrative   Not on file   Social Determinants of Health   Financial Resource Strain: Not on file  Food Insecurity: Not on file  Transportation Needs: Not on file  Physical Activity: Not on file  Stress: Not on file  Social Connections: Not on file  Intimate Partner Violence: Not on file    ALLERGIES:    Allergies  Allergen Reactions   Bacitracin     Other reaction(s): Other Burns skin     Citalopram Diarrhea   Doxycycline Other (See Comments)    Other reaction(s): Other "burning" all over body     Morphine And Related Hives   Neomycin     Other reaction(s): Other (See Comments), Unknown unknown    Ciprofloxacin     Tongue swells   Ciprofloxacin-Dexamethasone Other (See Comments)  unknown    Duloxetine     Other reaction(s): Mental Status Changes (intolerance) "saw pictures of gun"   Latex Itching   Levaquin [Levofloxacin In D5w] Other (See Comments)    Pt does not remember reaction but states she's had issues with med   Piper     Other reaction(s): Other (See Comments) White pepper- uncontrollable sneezing    Salvia Officinalis     Other reaction(s): Other (See Comments) Sneezing   Soma [Carisoprodol]     Sleepy and constipation   Lasix [Furosemide] Rash    CURRENT MEDICATIONS:    Current Facility-Administered Medications  Medication Dose Route Frequency Provider Last Rate Last Admin   acetaminophen (TYLENOL) tablet 650 mg  650 mg Oral Q6H PRN Jonah Blue, MD       Or   acetaminophen (TYLENOL)  suppository 650 mg  650 mg Rectal Q6H PRN Jonah Blue, MD       albuterol (PROVENTIL) (2.5 MG/3ML) 0.083% nebulizer solution 2.5 mg  2.5 mg Inhalation Q6H PRN Jonah Blue, MD       ALPRAZolam Prudy Feeler) tablet 1 mg  1 mg Oral TID PRN Jonah Blue, MD   1 mg at 10/07/21 0657   aspirin EC tablet 81 mg  81 mg Oral Daily Jonah Blue, MD   81 mg at 10/07/21 0946   bisacodyl (DULCOLAX) EC tablet 5 mg  5 mg Oral Daily PRN Jonah Blue, MD       busPIRone (BUSPAR) tablet 25 mg  25 mg Oral BID Jonah Blue, MD   25 mg at 10/07/21 0945   [START ON 10/11/2021] ceFAZolin (ANCEF) IVPB 2g/100 mL premix  2 g Intravenous On Call to OR Adonis Huguenin, NP       cefTRIAXone (ROCEPHIN) 2 g in sodium chloride 0.9 % 100 mL IVPB  2 g Intravenous Q24H Jonah Blue, MD 200 mL/hr at 10/07/21 0956 2 g at 10/07/21 0956   [START ON 10/11/2021] chlorhexidine (HIBICLENS) 4 % liquid 4 Application  60 mL Topical Once Madueme, Elvira C, RPH       clopidogrel (PLAVIX) tablet 75 mg  75 mg Oral Daily Jonah Blue, MD   75 mg at 10/07/21 0947   docusate sodium (COLACE) capsule 100 mg  100 mg Oral BID Jonah Blue, MD   100 mg at 10/07/21 0946   enoxaparin (LOVENOX) injection 40 mg  40 mg Subcutaneous Q24H Jonah Blue, MD   40 mg at 10/07/21 0948   feeding supplement (GLUCERNA SHAKE) (GLUCERNA SHAKE) liquid 237 mL  237 mL Oral TID BM Howerter, Justin B, DO       FLUoxetine (PROZAC) capsule 80 mg  80 mg Oral Daily Jonah Blue, MD   80 mg at 10/07/21 0946   gabapentin (NEURONTIN) capsule 300 mg  300 mg Oral TID Jonah Blue, MD   300 mg at 10/07/21 0944   guaiFENesin (MUCINEX) 12 hr tablet 600 mg  600 mg Oral BID Omelia Blackwater K, NP   600 mg at 10/07/21 0946   hydrALAZINE (APRESOLINE) injection 5 mg  5 mg Intravenous Q4H PRN Jonah Blue, MD       HYDROmorphone (DILAUDID) injection 0.5 mg  0.5 mg Intravenous Q4H PRN Jonah Blue, MD   0.5 mg at 10/07/21 0656   hydrOXYzine (ATARAX) tablet 10 mg   10 mg Oral BID Jonah Blue, MD   10 mg at 10/07/21 0944   insulin aspart (novoLOG) injection 0-15 Units  0-15 Units Subcutaneous TID [facility] Jonah Blue, MD  3 Units at 10/07/21 0947   insulin aspart (novoLOG) injection 0-5 Units  0-5 Units Subcutaneous QHS Jonah Blue, MD       insulin glargine-yfgn [facility]) injection 25 Units  25 Units Subcutaneous QHS Jonah Blue, MD   25 Units at 10/06/21 2247   lisinopril (ZESTRIL) tablet 5 mg  5 mg Oral Daily Jonah Blue, MD   5 mg at 10/07/21 0944   methocarbamol (ROBAXIN) tablet 500 mg  500 mg Oral BID PRN Jonah Blue, MD   500 mg at 10/07/21 0440   metroNIDAZOLE (FLAGYL) IVPB 500 mg  500 mg Intravenous Steva Colder, MD 100 mL/hr at 10/06/21 2243 500 mg at 10/06/21 2243   ondansetron (ZOFRAN) tablet 4 mg  4 mg Oral Q6H PRN Jonah Blue, MD       Or   ondansetron [facility] injection 4 mg  4 mg Intravenous Q6H PRN Jonah Blue, MD       oxyCODONE (Oxy IR/ROXICODONE) immediate release tablet 5-10 mg  5-10 mg Oral Q6H PRN Jonah Blue, MD   10 mg at 10/07/21 0345   pantoprazole (PROTONIX) EC tablet 40 mg  40 mg Oral BID Magda Paganini, MD   40 mg at 10/07/21 0946   polyethylene glycol (MIRALAX / GLYCOLAX) packet 17 g  17 g Oral Daily PRN Jonah Blue, MD       [START ON 10/11/2021] povidone-iodine 10 % swab 2 Application  2 Application Topical Once Synthia Innocent, RPH       rOPINIRole (REQUIP) tablet 0.25 mg  0.25 mg Oral QHS Jonah Blue, MD   0.25 mg at 10/06/21 2247   topiramate (TOPAMAX) tablet 25 mg  25 mg Oral Daily Jonah Blue, MD   25 mg at 10/07/21 0946   [START ON 10/11/2021] tranexamic acid (CYKLOKAPRON) 2,000 mg in sodium chloride 0.9 % 50 mL Topical Application  2,000 mg Topical To OR Adonis Huguenin, NP       vancomycin (VANCOCIN) IVPB 1000 mg/200 mL premix  1,000 mg Intravenous Q24H von Dohlen, Haley B, RPH 200 mL/hr at 10/07/21 0422 1,000 mg at 10/07/21 0422    REVIEW OF SYSTEMS:   [X]   denotes positive finding, [ ]  denotes negative finding Cardiac  Comments:  Chest pain or chest pressure:    Shortness of breath upon exertion:    Short of breath when lying flat:    Irregular heart rhythm:        Vascular    Pain in calf, thigh, or hip brought on by ambulation:    Pain in feet at night that wakes you up from your sleep:  x   Blood clot in your veins:    Leg swelling:         Pulmonary    Oxygen at home:    Productive cough:     Wheezing:         Neurologic    Sudden weakness in arms or legs:     Sudden numbness in arms or legs:     Sudden onset of difficulty speaking or slurred speech:    Temporary loss of vision in one eye:     Problems with dizziness:         Gastrointestinal    Blood in stool:      Vomited blood:         Genitourinary    Burning when urinating:     Blood in urine:        Psychiatric  Major depression:         Hematologic    Bleeding problems:    Problems with blood clotting too easily:        Skin    Rashes or ulcers:        Constitutional    Fever or chills:     PHYSICAL EXAM:   Vitals:   10/06/21 2047 10/07/21 0028 10/07/21 0500 10/07/21 0804  BP: (!) 163/69 (!) 116/52 (!) 163/67 (!) 107/53  Pulse: 75 74 80 69  Resp: 15 15 16 16   Temp: 98.7 F (37.1 C) 99.5 F (37.5 C) 99 F (37.2 C) 97.8 F (36.6 C)  TempSrc:   Oral   SpO2: 100% 100% 100% 93%  Weight:      Height:        GENERAL: The patient is a well-nourished female, in no acute distress. The vital signs are documented above. CARDIAC: There is a regular rate and rhythm.  VASCULAR: Nonpalpable pedal pulses but brisk Doppler signals PULMONARY: Nonlabored respirations ABDOMEN: Soft and non-tender with normal pitched bowel sounds.  MUSCULOSKELETAL: There are no major deformities or cyanosis. NEUROLOGIC: No focal weakness or paresthesias are detected. SKIN: Persistent wound on the foot PSYCHIATRIC: The patient has a normal affect.     STUDIES:    None  ASSESSMENT and PLAN   I believe the patient has a nonsalvageable left foot and will require a left below-knee amputation.  I discussed proceeding with this on Monday to expedite this Dr. Wednesday is not available until Wednesday.  I will follow-up with the patient on Sunday to confirm proceeding early.  She wants to discuss this with her family.   Wednesday, MD, FACS [facility] [phone] Pager [phone]

## 2021-10-07 NOTE — Progress Notes (Signed)
Pt states she does not want to wear CPAP currently but will call RT if she changes her mind. RT will cont to monitor as needed.

## 2021-10-07 NOTE — Progress Notes (Signed)
Initial Nutrition Assessment  DOCUMENTATION CODES:   Not applicable  INTERVENTION:   Glucerna Shake po TID, each supplement provides 220 kcal and 10 grams of protein  MVI with minerals daily.  Vitamin C 500 mg daily x 2 months.  Zinc 220 mg daily x 14 days.  NUTRITION DIAGNOSIS:   Increased nutrient needs related to wound healing as evidenced by estimated needs.  GOAL:   Patient will meet greater than or equal to 90% of their needs  MONITOR:   PO intake, Supplement acceptance, Labs, Skin  REASON FOR ASSESSMENT:   Consult Wound healing  ASSESSMENT:   66 yo female admitted with gangrene & critical limb ischemia of LLE. PMH includes DM with neuropathy & retinopathy, arthritis, HTN, HLD, asthma.  Plans for left BKA next week, possibly Monday.   Weight history reviewed.  No significant weight changes noted.  Currently on a carb modified diet, heart healthy. Meal intakes not documented.  Patient will need increased calories and protein to promote wound healing. Will add MVI and continue PO supplements. She is receiving Glucerna Shake TID.  Labs and medications reviewed. CBG: 167-121 this morning  NUTRITION - FOCUSED PHYSICAL EXAM:  Unable to complete, RD working remotely  Diet Order:   Diet Order             Diet heart healthy/carb modified Room service appropriate? Yes; Fluid consistency: Thin  Diet effective now                   EDUCATION NEEDS:   Not appropriate for education at this time  Skin:  Skin Assessment: Skin Integrity Issues: Skin Integrity Issues:: Diabetic Ulcer, Other (Comment) Diabetic Ulcer: multiple wounds to L foot and toes Other: MASD to breast  Last BM:  7/13  Height:   Ht Readings from Last 1 Encounters:  10/06/21 5\' 2"  (1.575 m)    Weight:   Wt Readings from Last 1 Encounters:  10/06/21 72.6 kg     BMI:  Body mass index is 29.26 kg/m.  Estimated Nutritional Needs:   Kcal:  1900-2100  Protein:  100-120  gm  Fluid:  >/= 2 L    10/08/21 RD, LDN, CNSC Please refer to Amion for contact information.

## 2021-10-08 DIAGNOSIS — E1152 Type 2 diabetes mellitus with diabetic peripheral angiopathy with gangrene: Secondary | ICD-10-CM

## 2021-10-08 DIAGNOSIS — I96 Gangrene, not elsewhere classified: Secondary | ICD-10-CM

## 2021-10-08 DIAGNOSIS — Z794 Long term (current) use of insulin: Secondary | ICD-10-CM

## 2021-10-08 DIAGNOSIS — I70262 Atherosclerosis of native arteries of extremities with gangrene, left leg: Secondary | ICD-10-CM | POA: Diagnosis not present

## 2021-10-08 LAB — GLUCOSE, CAPILLARY
Glucose-Capillary: 117 mg/dL — ABNORMAL HIGH (ref 70–99)
Glucose-Capillary: 98 mg/dL (ref 70–99)
Glucose-Capillary: 184 mg/dL — ABNORMAL HIGH (ref 70–99)
Glucose-Capillary: 114 mg/dL — ABNORMAL HIGH (ref 70–99)

## 2021-10-08 MED ORDER — LIP MEDEX EX OINT
1.0000 | TOPICAL_OINTMENT | CUTANEOUS | Status: DC | PRN
  Filled 2021-10-08: qty 7

## 2021-10-08 MED ORDER — ALPRAZOLAM 0.25 MG PO TABS
0.2500 mg | ORAL_TABLET | Freq: Three times a day (TID) | ORAL | Status: DC | PRN
  Administered 2021-10-08 – 2021-10-13 (×7): 0.25 mg via ORAL
  Filled 2021-10-08 (×7): qty 1

## 2021-10-08 NOTE — H&P (View-Only) (Signed)
ORTHOPAEDIC CONSULTATION  REQUESTING PHYSICIAN: Lanae Boast, MD  Chief Complaint: Ischemic pain in left foot.  HPI: Anna [name] is a 66 y.o. female who presents with ischemic pain left foot.  She is status post limb salvage intervention with 1/5 ray amputation but has had progressive gangrenous changes.  Patient has been optimized from a vascular standpoint with vascular surgery and still has ischemic rest pain.  Patient has uncontrolled type 2 diabetes.  Past Medical History:  Diagnosis Date   Arthritis    Asthma    Back pain    Bronchitis    Bursitis    Diabetes mellitus    Diabetic neuropathy (HCC)    Diabetic retinopathy    Hyperlipemia    Hypertension    Migraine    Obesity    Sepsis due to urinary tract infection [facility])    Past Surgical History:  Procedure Laterality Date   ABDOMINAL AORTOGRAM W/LOWER EXTREMITY Left 08/22/2021   Procedure: ABDOMINAL AORTOGRAM W/LOWER EXTREMITY;  Surgeon: Nada Libman, MD;  Location: [facility] INV[facility]IVE [facility];  Service: Cardiovascular;  Laterality: Left;   CESAREAN SECTION     x3   CHOLECYSTECTOMY     EYE SURGERY Left    METATARSAL HEAD EXCISION Left 08/24/2021   Procedure: METATARSAL HEAD EXCISION FIFTH TOE;  Surgeon: Vivi Barrack, DPM;  Location: [facility] OR;  Service: Podiatry;  Laterality: Left;   PERIPHERAL VASCULAR BALLOON ANGIOPLASTY  08/22/2021   Procedure: PERIPHERAL VASCULAR BALLOON ANGIOPLASTY;  Surgeon: Nada Libman, MD;  Location: [facility] INV[facility];  Service: Cardiovascular;;  Left AT   WOUND DEBRIDEMENT Left 08/24/2021   Procedure: DEBRIDEMENT WOUND OF LEFT FOOT;  Surgeon: Vivi Barrack, DPM;  Location: [facility] OR;  Service: Podiatry;  Laterality: Left;   Social History   Socioeconomic History   Marital status: Divorced    Spouse name: Not on file   Number of children: Not on file   Years of education: Not on file   Highest education level: Not on file  Occupational History   Not on file  Tobacco Use    Smoking status: Never   Smokeless tobacco: Never  Substance and Sexual Activity   Alcohol use: No   Drug use: No   Sexual activity: Not on file  Other Topics Concern   Not on file  Social History Narrative   Not on file   Social Determinants of Health   Financial Resource Strain: Not on file  Food Insecurity: Not on file  Transportation Needs: Not on file  Physical Activity: Not on file  Stress: Not on file  Social Connections: Not on file   History reviewed. No pertinent family history. - negative except otherwise stated in the family history section Allergies  Allergen Reactions   Bacitracin     Other reaction(s): Other Burns skin     Citalopram Diarrhea   Doxycycline Other (See Comments)    Other reaction(s): Other "burning" all over body     Morphine And Related Hives   Neomycin     Other reaction(s): Other (See Comments), Unknown unknown    Ciprofloxacin     Tongue swells   Ciprofloxacin-Dexamethasone Other (See Comments)    unknown    Duloxetine     Other reaction(s): Mental Status Changes (intolerance) "saw pictures of gun"   Latex Itching   Levaquin [Levofloxacin In D5w] Other (See Comments)    Pt does not remember reaction but states she's had issues with med  Piper     Other reaction(s): Other (See Comments) White pepper- uncontrollable sneezing    Salvia Officinalis     Other reaction(s): Other (See Comments) Sneezing   Soma [Carisoprodol]     Sleepy and constipation   Lasix [Furosemide] Rash   Prior to Admission medications   Medication Sig Start Date End Date Taking? Authorizing Provider  acetaminophen (TYLENOL) 500 MG tablet Take 1,000 mg by mouth every 6 (six) hours as needed for moderate pain.   Yes [provider]  albuterol (VENTOLIN HFA) 108 (90 Base) MCG/ACT inhaler Inhale 1-2 puffs into the lungs every 6 (six) hours as needed for wheezing or shortness of breath.   Yes [provider]  ALPRAZolam Prudy Feeler) 1 MG  tablet Take 1 mg by mouth 3 (three) times daily as needed for anxiety.   Yes [provider]  aspirin EC 81 MG tablet Take 1 tablet (81 mg total) by mouth daily. Swallow whole. 08/28/21 11/26/21 Yes Dahal, Melina Schools, MD  bisacodyl 5 MG EC tablet Take 10 mg by mouth daily as needed for moderate constipation.   Yes [provider]  busPIRone (BUSPAR) 10 MG tablet Take 25 mg by mouth 2 (two) times daily.   Yes [provider]  clopidogrel (PLAVIX) 75 MG tablet Take 75 mg by mouth daily.   Yes [provider]  diclofenac Sodium (VOLTAREN) 1 % GEL Apply 4 g topically 4 (four) times daily as needed (pain).   Yes [provider]  docusate sodium (COLACE) 100 MG capsule Take 100 mg by mouth 2 (two) times daily as needed for mild constipation.   Yes [provider]  Dulaglutide (TRULICITY) 1.5 MG/0.5ML SOPN Inject 3 mg into the skin once a week. Saturday   Yes [provider]  empagliflozin (JARDIANCE) 10 MG TABS tablet Take 10 mg by mouth daily.   Yes [provider]  esomeprazole (NEXIUM) 40 MG capsule Take 40 mg by mouth 2 (two) times daily before a meal.   Yes [provider]  estradiol (ESTRACE) 0.1 MG/GM vaginal cream Place 1 Applicatorful vaginally as needed (weekly as needed for dryness).   Yes [provider]  ferrous sulfate 325 (65 FE) MG tablet Take 325 mg by mouth daily with breakfast.   Yes [provider]  FLUoxetine (PROZAC) 40 MG capsule Take 80 mg by mouth daily.   Yes [provider]  fluticasone (FLONASE) 50 MCG/ACT nasal spray Place 1 spray into both nostrils daily as needed for allergies or rhinitis.   Yes [provider]  gabapentin (NEURONTIN) 300 MG capsule Take 300 mg by mouth 3 (three) times daily.   Yes [provider]  guaiFENesin (MUCINEX) 600 MG 12 hr tablet Take 600 mg by mouth 2 (two) times daily.   Yes [provider]  hydrOXYzine (ATARAX) 10 MG  tablet Take 10 mg by mouth 2 (two) times daily.   Yes [provider]  ibuprofen (ADVIL) 200 MG tablet Take 600 mg by mouth every 8 (eight) hours as needed for moderate pain.   Yes [provider]  insulin glargine, 1 Unit Dial, (TOUJEO SOLOSTAR) 300 UNIT/ML Solostar Pen Inject 30 Units into the skin at bedtime.   Yes [provider]  insulin lispro (HUMALOG) 100 UNIT/ML KwikPen Inject 2-9 Units into the skin 3 (three) times daily. Sliding scale: less than 150 -0 units 150-200 - 2 units 201-250 - 3 units 251-300 - 4 units 301-350 - 5 units 351-400 - 6 units  401-450 - 7 units 451-500 - 8 units 501-550- 9 units Greater than 550 notify provider.   Yes [provider]  Lactobacillus Rhamnosus, GG, (CULTURELLE HEALTH & WELLNESS) CAPS Take 1 capsule by mouth 2 (two) times daily. 08/05/21  Yes [provider]  lidocaine 4 % Place 1 patch onto the skin daily as needed (pain).   Yes [provider]  lisinopril (ZESTRIL) 5 MG tablet Take 5 mg by mouth daily.   Yes [provider]  loperamide (IMODIUM) 2 MG capsule Take 2 mg by mouth daily as needed for diarrhea or loose stools.   Yes [provider]  loratadine (CLARITIN) 10 MG tablet Take 10 mg by mouth daily as needed for allergies.   Yes [provider]  meclizine (ANTIVERT) 25 MG tablet Take 25 mg by mouth 4 (four) times daily as needed for dizziness.   Yes [provider]  metFORMIN (GLUCOPHAGE) 1000 MG tablet Take 1,000 mg by mouth 2 (two) times daily with a meal.   Yes [provider]  methocarbamol (ROBAXIN) 500 MG tablet Take 500 mg by mouth 2 (two) times daily as needed for muscle spasms.   Yes [provider]  Multiple Vitamin (MULTIVITAMIN WITH MINERALS) TABS tablet Take 1 tablet by mouth daily.   Yes [provider]  naloxone (NARCAN) nasal spray 4 mg/0.1 mL Place 1 spray into the nose once as needed (overdose).   Yes [provider]  nystatin cream (MYCOSTATIN) Apply 1 application. topically 4 (four) times daily as needed for dry skin.   Yes [provider]  ondansetron (ZOFRAN) 4 MG tablet Take 4 mg by mouth every 8 (eight) hours as needed for nausea or vomiting.   Yes [provider]  ondansetron (ZOFRAN-ODT) 4 MG disintegrating tablet Take 4 mg by mouth every 8 (eight) hours as needed for nausea or vomiting.   Yes [provider]  oxycodone (OXY-IR) 5 MG capsule Take 5-10 mg by mouth every 6 (six) hours as needed for pain.   Yes [provider]  polyethylene glycol (MIRALAX / GLYCOLAX) 17 g packet Take 17 g by mouth daily as needed for moderate constipation.   Yes [provider]  potassium chloride SA (KLOR-CON M) 20 MEQ tablet Take 20 mEq by mouth daily.   Yes [provider]  rOPINIRole (REQUIP) 0.25 MG tablet Take 0.25 mg by mouth at bedtime.   Yes [provider]  sucralfate (CARAFATE) 1 GM/10ML suspension Take 1 g by mouth 4 (four) times daily as needed (stomch ulcer).   Yes [provider]  topiramate (TOPAMAX) 25 MG tablet Take 25 mg by mouth daily.   Yes [provider]   No results found. - pertinent xrays, CT, MRI studies were reviewed and independently interpreted  Positive ROS: All other systems have been reviewed and were otherwise negative with the exception of those mentioned in the HPI and as above.  Physical Exam: General: Alert, no acute distress Psychiatric: Patient is competent for consent with normal mood and affect Lymphatic: No axillary or cervical lymphadenopathy Cardiovascular: No pedal edema Respiratory: No cyanosis, no use of accessory musculature GI: No organomegaly, abdomen is soft and non-tender    Images:  @ENCIMAGES @  Labs:  Lab Results  Component Value Date   HGBA1C 8.2 (H) 08/20/2021   ESRSEDRATE 84 (H) 10/06/2021   ESRSEDRATE 37 (H) 08/19/2021   CRP 0.8 10/06/2021   CRP <0.5  08/19/2021   REPTSTATUS PENDING 10/05/2021  CULT  10/05/2021    NO GROWTH 3 DAYS Performed at [facility], [address] [place], [place] [address]    Leanor Kail SPECIES 05/18/2011    Lab Results  Component Value Date   ALBUMIN 3.3 (L) 10/06/2021   ALBUMIN 3.5 10/05/2021   ALBUMIN 2.8 (L) 08/26/2021   PREALBUMIN 20 10/06/2021   PREALBUMIN 19.0 08/20/2021        Latest Ref Rng & Units 10/06/2021    5:00 AM 10/05/2021    2:37 PM 09/20/2021    3:35 PM  CBC EXTENDED  WBC 4.0 - 10.5 K/uL 11.5  11.1  10.4   RBC 3.87 - 5.11 MIL/uL 3.90  4.14  4.03   Hemoglobin 12.0 - 15.0 g/dL 48.5  46.2  70.3   HCT 36.0 - 46.0 % 34.5  35.8  35.5   Platelets 150 - 400 K/uL 512  532  343   NEUT# 1.7 - 7.7 K/uL 6.6  6.9  5.4   Lymph# 0.7 - 4.0 K/uL 3.3  3.0  3.6     Neurologic: Patient does not have protective sensation bilateral lower extremities.   MUSCULOSKELETAL:   Skin: Examination patient has a gangrenous ulcer to the left great toe as well as a gangrenous wound from the fifth ray amputation.  There is no ascending cellulitis.  Patient's white blood cell count is 11.5 hemoglobin 10.4.  Albumin 3.3.  Patient has pitting edema in the left lower extremity without a palpable pulse.  Most recent ankle-brachial indices shows monophasic noncompressible flow.  Toe ankle-brachial indices is absent.  Assessment: Assessment: Diabetic insensate neuropathy with peripheral vascular disease with gangrenous changes to the left foot with ischemic rest pain.  Plan: Plan: We will plan for a left transtibial amputation on Wednesday.  Risks and benefits of surgery were discussed discussed use of biologic tissue graft for wound healing.  Patient states she understands wished to proceed at this time.  Thank you for the consult and the opportunity to see Ms. Rosary Lively, MD [facility] [phone] 11:20 AM

## 2021-10-08 NOTE — Progress Notes (Signed)
PROGRESS NOTE Anna [name]  ZDG:[id number] DOB: December 07, 1955 DOA: 10/05/2021 PCP: Jethro Bastos, MD   Brief Narrative/Hospital Course: 66 y.o.f w/ history significant of DM, HTN, and HLD presented with L foot infection. She is scheduled for L BKA next week (7/18) by Dr Lajoyce Corners but presented to the ED with significant increase in her left foot swelling tenderness redness along with fever subjective, chills.  In the ED BMP stable CBC with mild leukocytosis lactic acid 1.6 chronic anemia, thrombocytosis 532, sed rate 84. Patient was admitted for amputation.    Subjective: Seen and examined.  Appears to be drowsy but reports he had to wake up in the middle of the night screaming with pain Overnight afebrile continues to need oxycodone, IV Dilaudid for pain control, was added on OxyContin 10 twice daily 7/15-and pain is better controlled with this.   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- was scheduled for outpatient BKA 7/18 but presented with worsening infection and pain- was added on OxyContin 10 twice daily 7/15pain is better controlled with this.-Discussed with nursing staff to hold  prn Dilaudid/Oxy/robaxin if lethargic todau.  Redness improving but continues to have pain.  ContinueRocephin, vancomycin, Flagyl along with home aspirin and Plavix.  Continue pain control with opiates, muscle relaxant, Neurontin.  Vascular surgery had seen, at this time she has a nonsalvageable left foot and will require left BKA, likely anticipating amputation on Monday as [name] is not available until Wednesday.  Essential hypertension: Stable on lisinopril T2DM W/ diabetic  neuropathy: Well-controlled on SSI, Semglee 25 units hold if NPO Recent Labs  Lab 10/07/21 0907 10/07/21 1128 10/07/21 1544 10/07/21 2116 10/08/21 0739  GLUCAP 167* 121* 105* 125* 117*  OSA on CPAP-continue the same Dyslipidemia-previously on Crestor but stopped in June as LDL at goal.   Mood disorder: cont BuSpar, Requip, Prozac, Neurontin, Atarax and Topamax Hepatic steatosis monitor lfts. COPD-not in exacerbation, cont nebs Chronic anemia-monitor Overweight with Body mass index is 29.26 kg/m. : will benefit with weight loss. RLS/Anxiety:cont home meds    DVT prophylaxis: enoxaparin (LOVENOX) injection 40 mg Start: 10/06/21 1000 Code Status:   Code Status: Full Code Family Communication: plan of care discussed with patient at bedside. Patient status is: Inpatient because of ongoing management of critical limb ischemia Level of care: Med-Surg   Dispo: The patient is from: home            Anticipated disposition: TBD, pending left BKA  Mobility Assessment (last 72 hours)     Mobility Assessment     Row Name 10/07/21 1000 10/06/21 2045         Does patient have an order for bedrest or is patient medically unstable No - Continue assessment No - Continue assessment      What is the highest level of mobility based on the progressive mobility assessment? Level 2 (Chairfast) - Balance while sitting on edge of bed and cannot stand Level 2 (Chairfast) - Balance while sitting on edge of bed and cannot stand      Is the above level different from baseline mobility prior to current illness? Yes - Recommend PT order Yes - Recommend PT order  Objective: Vitals last 24 hrs: Vitals:   10/07/21 1546 10/07/21 2113 10/08/21 0405 10/08/21 0740  BP: (!) 122/50 (!) 120/57 (!) 126/57 (!) 147/63  Pulse: 69 71 76 80  Resp: 16 15 16 16   Temp: 98 F (36.7 C) 97.8 F (36.6 C) 98.3 F (36.8 C) 99.2 F (37.3 C)  TempSrc: Oral Oral Oral Oral  SpO2: 94% (!) 89% 94% 94%   Weight:      Height:       Weight change:   Physical Examination: General exam: Mild lethargic but able to wake up answer questions appropriately, older than stated age, weak appearing. HEENT:Oral mucosa moist, Ear/Nose WNL grossly, dentition normal. Respiratory system: bilaterally clear no use of accessory muscle Cardiovascular system: S1 & S2 +, No JVD,. Gastrointestinal system: Abdomen soft,NT,ND,BS+ Nervous System:Alert, awake, moving extremities and grossly nonfocal Extremities: Left Foot with dry gangrene as below , distal peripheral pulses palpable.  Skin: No rashes,no icterus. MSK: Normal muscle bulk,tone, power   Medications reviewed:  Scheduled Meds:  vitamin C  500 mg Oral Daily   aspirin EC  81 mg Oral Daily   busPIRone  25 mg Oral BID   [START ON 10/11/2021] chlorhexidine  60 mL Topical Once   clopidogrel  75 mg Oral Daily   docusate sodium  100 mg Oral BID   enoxaparin (LOVENOX) injection  40 mg Subcutaneous Q24H   feeding supplement (GLUCERNA SHAKE)  237 mL Oral TID BM   FLUoxetine  80 mg Oral Daily   gabapentin  300 mg Oral TID   guaiFENesin  600 mg Oral BID   hydrOXYzine  10 mg Oral BID   insulin aspart  0-15 Units Subcutaneous TID WC   insulin aspart  0-5 Units Subcutaneous QHS   insulin glargine-yfgn  25 Units Subcutaneous QHS   lisinopril  5 mg Oral Daily   multivitamin with minerals  1 tablet Oral Daily   oxyCODONE  10 mg Oral Q12H   pantoprazole  40 mg Oral BID AC   [START ON 10/11/2021] povidone-iodine  2 Application Topical Once   rOPINIRole  0.25 mg Oral QHS   topiramate  25 mg Oral Daily   [START ON 10/11/2021] tranexamic acid (CYKLOKAPRON) 2,000 mg in sodium chloride 0.9 % 50 mL Topical Application  2,000 mg Topical To OR   zinc sulfate  220 mg Oral Daily   Continuous Infusions:  [START ON 10/11/2021]  ceFAZolin (ANCEF) IV     cefTRIAXone (ROCEPHIN)  IV 2 g (10/08/21 0936)   metronidazole 500 mg (10/07/21 2115)   vancomycin 1,000 mg (10/08/21  0347)      Diet Order             Diet NPO time specified Except for: Sips with Meds  Diet effective midnight           Diet heart healthy/carb modified Room service appropriate? Yes; Fluid consistency: Thin  Diet effective now                    Nutrition Problem: Increased nutrient needs Etiology: wound healing Signs/Symptoms: estimated needs Interventions: Glucerna shake, MVI  No intake or output data in the 24 hours ending 10/08/21 1016 Net IO Since Admission: 390.74 mL [10/08/21 1016]  Wt Readings from Last 3 Encounters:  10/06/21 72.6 kg  08/19/21 73.5 kg  07/30/21 73.5 kg     Unresulted Labs (From admission, onward)    None     Data Reviewed: I have personally reviewed following  labs and imaging studies CBC: Recent Labs  Lab 10/05/21 1437 10/06/21 0500  WBC 11.1* 11.5*  NEUTROABS 6.9 6.6  HGB 11.2* 10.4*  HCT 35.8* 34.5*  MCV 86.5 88.5  PLT 532* 512*   Basic Metabolic Panel: Recent Labs  Lab 10/05/21 1437 10/06/21 0500  NA 138 139  K 4.4 3.8  CL 100 101  CO2 24 25  GLUCOSE 151* 120*  BUN 17 15  CREATININE 0.96 0.98  CALCIUM 9.5 9.1  MG  --  2.2   GFR: Estimated Creatinine Clearance: 52.7 mL/min (by C-G formula based on SCr of 0.98 mg/dL). Liver Function Tests: Recent Labs  Lab 10/05/21 1437 10/06/21 0500  AST 23 22  ALT 16 15  ALKPHOS 71 65  BILITOT 0.6 0.2*  PROT 7.2 6.7  ALBUMIN 3.5 3.3*   No results for input(s): "LIPASE", "AMYLASE" in the last 168 hours. No results for input(s): "AMMONIA" in the last 168 hours. Coagulation Profile: Recent Labs  Lab 10/05/21 1437  INR 1.0   BNP (last 3 results) No results for input(s): "PROBNP" in the last 8760 hours. HbA1C: No results for input(s): "HGBA1C" in the last 72 hours. CBG: Recent Labs  Lab 10/07/21 0907 10/07/21 1128 10/07/21 1544 10/07/21 2116 10/08/21 0739  GLUCAP 167* 121* 105* 125* 117*   Lipid Profile: No results for input(s): "CHOL", "HDL", "LDLCALC",  "TRIG", "CHOLHDL", "LDLDIRECT" in the last 72 hours. Thyroid Function Tests: No results for input(s): "TSH", "T4TOTAL", "FREET4", "T3FREE", "THYROIDAB" in the last 72 hours. Sepsis Labs: Recent Labs  Lab 10/05/21 1437 10/06/21 0206  LATICACIDVEN 1.6 0.9    Recent Results (from the past 240 hour(s))  Culture, blood (Routine x 2)     Status: None (Preliminary result)   Collection Time: 10/05/21  2:37 PM   Specimen: BLOOD  Result Value Ref Range Status   Specimen Description BLOOD BLOOD LEFT FOREARM  Final   Special Requests   Final    BOTTLES DRAWN AEROBIC AND ANAEROBIC Blood Culture adequate volume   Culture   Final    NO GROWTH 3 DAYS Performed at [facility], [address], [place], [place] [address]    Report Status PENDING  Incomplete  Culture, blood (Routine x 2)     Status: None (Preliminary result)   Collection Time: 10/05/21  2:47 PM   Specimen: BLOOD  Result Value Ref Range Status   Specimen Description BLOOD SITE NOT SPECIFIED  Final   Special Requests   Final    BOTTLES DRAWN AEROBIC AND ANAEROBIC Blood Culture adequate volume   Culture   Final    NO GROWTH 3 DAYS Performed at [facility], [address], [place], [place] [address]    Report Status PENDING  Incomplete  Surgical pcr screen     Status: None   Collection Time: 10/07/21  5:40 AM   Specimen: Nasal Mucosa; Nasal Swab  Result Value Ref Range Status   MRSA, PCR NEGATIVE NEGATIVE Final   Staphylococcus aureus NEGATIVE NEGATIVE Final    Comment: (NOTE) The Xpert SA Assay (FDA approved for NASAL specimens in patients 28 years of age and older), is one component of a comprehensive surveillance program. It is not intended to diagnose infection nor to guide or monitor treatment. Performed at [facility], [address], [place], [place] [address]     Antimicrobials: Anti-infectives (From admission, onward)    Start     Dose/Rate Route Frequency Ordered Stop   10/11/21  0600   ceFAZolin (ANCEF) IVPB 2g/100 mL premix        2 g 200 mL/hr over 30 Minutes Intravenous On call to O.R. 10/06/21 2011 10/12/21 0559   10/07/21 0600  vancomycin (VANCOREADY) IVPB 1250 mg/250 mL  Status:  Discontinued        1,250 mg 166.7 mL/hr over 90 Minutes Intravenous Every 24 hours 10/06/21 0458 10/06/21 1005   10/07/21 0300  vancomycin (VANCOCIN) IVPB 1000 mg/200 mL premix        1,000 mg 200 mL/hr over 60 Minutes Intravenous Every 24 hours 10/06/21 1005     10/06/21 1000  cefTRIAXone (ROCEPHIN) 2 g in sodium chloride 0.9 % 100 mL IVPB        2 g 200 mL/hr over 30 Minutes Intravenous Every 24 hours 10/06/21 0849 10/13/21 0959   10/06/21 1000  metroNIDAZOLE (FLAGYL) IVPB 500 mg        500 mg 100 mL/hr over 60 Minutes Intravenous Every 12 hours 10/06/21 0849 10/13/21 0959   10/06/21 0315  piperacillin-tazobactam (ZOSYN) IVPB 3.375 g        3.375 g 100 mL/hr over 30 Minutes Intravenous  Once 10/06/21 0305 10/06/21 0610   10/06/21 0115  vancomycin (VANCOREADY) IVPB 1500 mg/300 mL        1,500 mg 150 mL/hr over 120 Minutes Intravenous  Once 10/06/21 0110 10/06/21 0454     Culture/Microbiology    Component Value Date/Time   SDES BLOOD SITE NOT SPECIFIED 10/05/2021 1447   SPECREQUEST  10/05/2021 1447    BOTTLES DRAWN AEROBIC AND ANAEROBIC Blood Culture adequate volume   CULT  10/05/2021 1447    NO GROWTH 3 DAYS Performed at [facility], [address], [place], [place] [address]    REPTSTATUS PENDING 10/05/2021 1447  Radiology Studies: No results found.  LOS: 2 days  Lanae Boast, MD [facility]  10/08/2021, 10:16 AM

## 2021-10-08 NOTE — Plan of Care (Signed)
  Problem: Coping: Goal: Ability to adjust to condition or change in health will improve Outcome: Progressing   Problem: Metabolic: Goal: Ability to maintain appropriate glucose levels will improve Outcome: Progressing   

## 2021-10-08 NOTE — Progress Notes (Addendum)
  Progress Note    10/08/2021 9:05 AM Hospital Day 2  Subjective:  resting in her room.  Falls asleep easily.  Says she has pain in her foot.  T. 99.2  Vitals:   10/08/21 0405 10/08/21 0740  BP: (!) 126/57 (!) 147/63  Pulse: 76 80  Resp: 16 16  Temp: 98.3 F (36.8 C) 99.2 F (37.3 C)  SpO2: 94% 94%    Physical Exam: General:  no distress; resting comfortably but falls asleep easily Lungs:  non labored   CBC    Component Value Date/Time   WBC 11.5 (H) 10/06/2021 0500   RBC 3.90 10/06/2021 0500   HGB 10.4 (L) 10/06/2021 0500   HCT 34.5 (L) 10/06/2021 0500   PLT 512 (H) 10/06/2021 0500   MCV 88.5 10/06/2021 0500   MCH 26.7 10/06/2021 0500   MCHC 30.1 10/06/2021 0500   RDW 14.8 10/06/2021 0500   LYMPHSABS 3.3 10/06/2021 0500   MONOABS 1.0 10/06/2021 0500   EOSABS 0.5 10/06/2021 0500   BASOSABS 0.1 10/06/2021 0500    BMET    Component Value Date/Time   NA 139 10/06/2021 0500   K 3.8 10/06/2021 0500   CL 101 10/06/2021 0500   CO2 25 10/06/2021 0500   GLUCOSE 120 (H) 10/06/2021 0500   BUN 15 10/06/2021 0500   CREATININE 0.98 10/06/2021 0500   CALCIUM 9.1 10/06/2021 0500   GFRNONAA >60 10/06/2021 0500   GFRAA >90 06/25/2014 0535    INR    Component Value Date/Time   INR 1.0 10/05/2021 1437     Intake/Output Summary (Last 24 hours) at 10/08/2021 0905 Last data filed at 10/07/2021 1000 Gross per 24 hour  Intake --  Output 400 ml  Net -400 ml     Assessment/Plan:  66 y.o. 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  She still needs to speak with her family.  I told her I would put in order for npo after MN in case she decides to proceed.     Anna Massed, PA-C [facility] [phone] 10/08/2021 9:05 AM   I agree with the above.  I spoke to [name] who has her scheduled for BKA  Wednesday.  He will see her later today  WElls Jasmarie Coppock

## 2021-10-08 NOTE — Inpatient Diabetes Management (Signed)
Inpatient Diabetes Program Recommendations  AACE/ADA: New Consensus Statement on Inpatient Glycemic Control (2015)  Target Ranges:  Prepandial:   less than 140 mg/dL      Peak postprandial:   less than 180 mg/dL (1-2 hours)      Critically ill patients:  140 - 180 mg/dL   Lab Results  Component Value Date   GLUCAP 98 10/08/2021   HGBA1C 8.2 (H) 08/20/2021    Review of Glycemic Control  Diabetes history: DM 2 Outpatient Diabetes medications: Metformin 1000 mg bid, Toujeo 30 units qhs, Trulicity 3 mg, Jardiance 10 mg Daily, Humalog SSI 2-9 units tid Current orders for Inpatient glycemic control:  Semglee 25 units qhs Novolog 0-15 units tid + hs  A1c 8.2% on 5/28  Glucose trends WNL  Pt resting in bed at this time. High A1c level of 8.2% is more than likely the result of the current infectious process. Pt will be inpatient till at least Thursday this week. Will follow pt with glucose trends.  Thanks,  Christena Deem RN, MSN, BC-ADM Inpatient Diabetes Coordinator Team Pager [phone] (8a-5p)

## 2021-10-08 NOTE — Consult Note (Signed)
ORTHOPAEDIC CONSULTATION  REQUESTING PHYSICIAN: Lanae Boast, MD  Chief Complaint: Ischemic pain in left foot.  HPI: Anna [name] is a 66 y.o. female who presents with ischemic pain left foot.  She is status post limb salvage intervention with 1/5 ray amputation but has had progressive gangrenous changes.  Patient has been optimized from a vascular standpoint with vascular surgery and still has ischemic rest pain.  Patient has uncontrolled type 2 diabetes.  Past Medical History:  Diagnosis Date   Arthritis    Asthma    Back pain    Bronchitis    Bursitis    Diabetes mellitus    Diabetic neuropathy (HCC)    Diabetic retinopathy    Hyperlipemia    Hypertension    Migraine    Obesity    Sepsis due to urinary tract infection [facility])    Past Surgical History:  Procedure Laterality Date   ABDOMINAL AORTOGRAM W/LOWER EXTREMITY Left 08/22/2021   Procedure: ABDOMINAL AORTOGRAM W/LOWER EXTREMITY;  Surgeon: Nada Libman, MD;  Location: [facility] INVASIVE [facility];  Service: Cardiovascular;  Laterality: Left;   CESAREAN SECTION     x3   CHOLECYSTECTOMY     EYE SURGERY Left    METATARSAL HEAD EXCISION Left 08/24/2021   Procedure: METATARSAL HEAD EXCISION FIFTH TOE;  Surgeon: Vivi Barrack, DPM;  Location: [facility] OR;  Service: Podiatry;  Laterality: Left;   PERIPHERAL VASCULAR BALLOON ANGIOPLASTY  08/22/2021   Procedure: PERIPHERAL VASCULAR BALLOON ANGIOPLASTY;  Surgeon: Nada Libman, MD;  Location: [facility];  Service: Cardiovascular;;  Left AT   WOUND DEBRIDEMENT Left 08/24/2021   Procedure: DEBRIDEMENT WOUND OF LEFT FOOT;  Surgeon: Vivi Barrack, DPM;  Location: [facility] OR;  Service: Podiatry;  Laterality: Left;   Social History   Socioeconomic History   Marital status: Divorced    Spouse name: Not on file   Number of children: Not on file   Years of education: Not on file   Highest education level: Not on file  Occupational History   Not on file  Tobacco Use    Smoking status: Never   Smokeless tobacco: Never  Substance and Sexual Activity   Alcohol use: No   Drug use: No   Sexual activity: Not on file  Other Topics Concern   Not on file  Social History Narrative   Not on file   Social Determinants of Health   Financial Resource Strain: Not on file  Food Insecurity: Not on file  Transportation Needs: Not on file  Physical Activity: Not on file  Stress: Not on file  Social Connections: Not on file   History reviewed. No pertinent family history. - negative except otherwise stated in the family history section Allergies  Allergen Reactions   Bacitracin     Other reaction(s): Other Burns skin     Citalopram Diarrhea   Doxycycline Other (See Comments)    Other reaction(s): Other "burning" all over body     Morphine And Related Hives   Neomycin     Other reaction(s): Other (See Comments), Unknown unknown    Ciprofloxacin     Tongue swells   Ciprofloxacin-Dexamethasone Other (See Comments)    unknown    Duloxetine     Other reaction(s): Mental Status Changes (intolerance) "saw pictures of gun"   Latex Itching   Levaquin [Levofloxacin In D5w] Other (See Comments)    Pt does not remember reaction but states she's had issues with med  Piper     Other reaction(s): Other (See Comments) White pepper- uncontrollable sneezing    Salvia Officinalis     Other reaction(s): Other (See Comments) Sneezing   Soma [Carisoprodol]     Sleepy and constipation   Lasix [Furosemide] Rash   Prior to Admission medications   Medication Sig Start Date End Date Taking? Authorizing Provider  acetaminophen (TYLENOL) 500 MG tablet Take 1,000 mg by mouth every 6 (six) hours as needed for moderate pain.   Yes [provider]  albuterol (VENTOLIN HFA) 108 (90 Base) MCG/ACT inhaler Inhale 1-2 puffs into the lungs every 6 (six) hours as needed for wheezing or shortness of breath.   Yes [provider]  ALPRAZolam Prudy Feeler) 1 MG  tablet Take 1 mg by mouth 3 (three) times daily as needed for anxiety.   Yes [provider]  aspirin EC 81 MG tablet Take 1 tablet (81 mg total) by mouth daily. Swallow whole. 08/28/21 11/26/21 Yes Dahal, Melina Schools, MD  bisacodyl 5 MG EC tablet Take 10 mg by mouth daily as needed for moderate constipation.   Yes [provider]  busPIRone (BUSPAR) 10 MG tablet Take 25 mg by mouth 2 (two) times daily.   Yes [provider]  clopidogrel (PLAVIX) 75 MG tablet Take 75 mg by mouth daily.   Yes [provider]  diclofenac Sodium (VOLTAREN) 1 % GEL Apply 4 g topically 4 (four) times daily as needed (pain).   Yes [provider]  docusate sodium (COLACE) 100 MG capsule Take 100 mg by mouth 2 (two) times daily as needed for mild constipation.   Yes [provider]  Dulaglutide (TRULICITY) 1.5 MG/0.5ML SOPN Inject 3 mg into the skin once a week. Saturday   Yes [provider]  empagliflozin (JARDIANCE) 10 MG TABS tablet Take 10 mg by mouth daily.   Yes [provider]  esomeprazole (NEXIUM) 40 MG capsule Take 40 mg by mouth 2 (two) times daily before a meal.   Yes [provider]  estradiol (ESTRACE) 0.1 MG/GM vaginal cream Place 1 Applicatorful vaginally as needed (weekly as needed for dryness).   Yes [provider]  ferrous sulfate 325 (65 FE) MG tablet Take 325 mg by mouth daily with breakfast.   Yes [provider]  FLUoxetine (PROZAC) 40 MG capsule Take 80 mg by mouth daily.   Yes [provider]  fluticasone (FLONASE) 50 MCG/ACT nasal spray Place 1 spray into both nostrils daily as needed for allergies or rhinitis.   Yes [provider]  gabapentin (NEURONTIN) 300 MG capsule Take 300 mg by mouth 3 (three) times daily.   Yes [provider]  guaiFENesin (MUCINEX) 600 MG 12 hr tablet Take 600 mg by mouth 2 (two) times daily.   Yes [provider]  hydrOXYzine (ATARAX) 10 MG  tablet Take 10 mg by mouth 2 (two) times daily.   Yes [provider]  ibuprofen (ADVIL) 200 MG tablet Take 600 mg by mouth every 8 (eight) hours as needed for moderate pain.   Yes [provider]  insulin glargine, 1 Unit Dial, (TOUJEO SOLOSTAR) 300 UNIT/ML Solostar Pen Inject 30 Units into the skin at bedtime.   Yes [provider]  insulin lispro (HUMALOG) 100 UNIT/ML KwikPen Inject 2-9 Units into the skin 3 (three) times daily. Sliding scale: less than 150 -0 units 150-200 - 2 units 201-250 - 3 units 251-300 - 4 units 301-350 - 5 units 351-400 - 6 units  401-450 - 7 units 451-500 - 8 units 501-550- 9 units Greater than 550 notify provider.   Yes [provider]  Lactobacillus Rhamnosus, GG, (CULTURELLE HEALTH & WELLNESS) CAPS Take 1 capsule by mouth 2 (two) times daily. 08/05/21  Yes [provider]  lidocaine 4 % Place 1 patch onto the skin daily as needed (pain).   Yes [provider]  lisinopril (ZESTRIL) 5 MG tablet Take 5 mg by mouth daily.   Yes [provider]  loperamide (IMODIUM) 2 MG capsule Take 2 mg by mouth daily as needed for diarrhea or loose stools.   Yes [provider]  loratadine (CLARITIN) 10 MG tablet Take 10 mg by mouth daily as needed for allergies.   Yes [provider]  meclizine (ANTIVERT) 25 MG tablet Take 25 mg by mouth 4 (four) times daily as needed for dizziness.   Yes [provider]  metFORMIN (GLUCOPHAGE) 1000 MG tablet Take 1,000 mg by mouth 2 (two) times daily with a meal.   Yes [provider]  methocarbamol (ROBAXIN) 500 MG tablet Take 500 mg by mouth 2 (two) times daily as needed for muscle spasms.   Yes [provider]  Multiple Vitamin (MULTIVITAMIN WITH MINERALS) TABS tablet Take 1 tablet by mouth daily.   Yes [provider]  naloxone (NARCAN) nasal spray 4 mg/0.1 mL Place 1 spray into the nose once as needed (overdose).   Yes [provider]  nystatin cream (MYCOSTATIN) Apply 1 application. topically 4 (four) times daily as needed for dry skin.   Yes [provider]  ondansetron (ZOFRAN) 4 MG tablet Take 4 mg by mouth every 8 (eight) hours as needed for nausea or vomiting.   Yes [provider]  ondansetron (ZOFRAN-ODT) 4 MG disintegrating tablet Take 4 mg by mouth every 8 (eight) hours as needed for nausea or vomiting.   Yes [provider]  oxycodone (OXY-IR) 5 MG capsule Take 5-10 mg by mouth every 6 (six) hours as needed for pain.   Yes [provider]  polyethylene glycol (MIRALAX / GLYCOLAX) 17 g packet Take 17 g by mouth daily as needed for moderate constipation.   Yes [provider]  potassium chloride SA (KLOR-CON M) 20 MEQ tablet Take 20 mEq by mouth daily.   Yes [provider]  rOPINIRole (REQUIP) 0.25 MG tablet Take 0.25 mg by mouth at bedtime.   Yes [provider]  sucralfate (CARAFATE) 1 GM/10ML suspension Take 1 g by mouth 4 (four) times daily as needed (stomch ulcer).   Yes [provider]  topiramate (TOPAMAX) 25 MG tablet Take 25 mg by mouth daily.   Yes [provider]   No results found. - pertinent xrays, CT, MRI studies were reviewed and independently interpreted  Positive ROS: All other systems have been reviewed and were otherwise negative with the exception of those mentioned in the HPI and as above.  Physical Exam: General: Alert, no acute distress Psychiatric: Patient is competent for consent with normal mood and affect Lymphatic: No axillary or cervical lymphadenopathy Cardiovascular: No pedal edema Respiratory: No cyanosis, no use of accessory musculature GI: No organomegaly, abdomen is soft and non-tender    Images:  @ENCIMAGES @  Labs:  Lab Results  Component Value Date   HGBA1C 8.2 (H) 08/20/2021   ESRSEDRATE 84 (H) 10/06/2021   ESRSEDRATE 37 (H) 08/19/2021   CRP 0.8 10/06/2021   CRP <0.5  08/19/2021   REPTSTATUS PENDING 10/05/2021  CULT  10/05/2021    NO GROWTH 3 DAYS Performed at [facility], [address] [place], [place] [address]    Leanor Kail SPECIES 05/18/2011    Lab Results  Component Value Date   ALBUMIN 3.3 (L) 10/06/2021   ALBUMIN 3.5 10/05/2021   ALBUMIN 2.8 (L) 08/26/2021   PREALBUMIN 20 10/06/2021   PREALBUMIN 19.0 08/20/2021        Latest Ref Rng & Units 10/06/2021    5:00 AM 10/05/2021    2:37 PM 09/20/2021    3:35 PM  CBC EXTENDED  WBC 4.0 - 10.5 K/uL 11.5  11.1  10.4   RBC 3.87 - 5.11 MIL/uL 3.90  4.14  4.03   Hemoglobin 12.0 - 15.0 g/dL 48.5  46.2  70.3   HCT 36.0 - 46.0 % 34.5  35.8  35.5   Platelets 150 - 400 K/uL 512  532  343   NEUT# 1.7 - 7.7 K/uL 6.6  6.9  5.4   Lymph# 0.7 - 4.0 K/uL 3.3  3.0  3.6     Neurologic: Patient does not have protective sensation bilateral lower extremities.   MUSCULOSKELETAL:   Skin: Examination patient has a gangrenous ulcer to the left great toe as well as a gangrenous wound from the fifth ray amputation.  There is no ascending cellulitis.  Patient's white blood cell count is 11.5 hemoglobin 10.4.  Albumin 3.3.  Patient has pitting edema in the left lower extremity without a palpable pulse.  Most recent ankle-brachial indices shows monophasic noncompressible flow.  Toe ankle-brachial indices is absent.  Assessment: Assessment: Diabetic insensate neuropathy with peripheral vascular disease with gangrenous changes to the left foot with ischemic rest pain.  Plan: Plan: We will plan for a left transtibial amputation on Wednesday.  Risks and benefits of surgery were discussed discussed use of biologic tissue graft for wound healing.  Patient states she understands wished to proceed at this time.  Thank you for the consult and the opportunity to see Ms. Rosary Lively, MD [facility] [phone] 11:20 AM

## 2021-10-09 ENCOUNTER — Encounter: Payer: Self-pay | Admitting: Orthopedic Surgery

## 2021-10-09 DIAGNOSIS — I70262 Atherosclerosis of native arteries of extremities with gangrene, left leg: Secondary | ICD-10-CM | POA: Diagnosis not present

## 2021-10-09 LAB — GLUCOSE, CAPILLARY
Glucose-Capillary: 95 mg/dL (ref 70–99)
Glucose-Capillary: 140 mg/dL — ABNORMAL HIGH (ref 70–99)
Glucose-Capillary: 159 mg/dL — ABNORMAL HIGH (ref 70–99)
Glucose-Capillary: 162 mg/dL — ABNORMAL HIGH (ref 70–99)

## 2021-10-09 MED ORDER — OXYCODONE HCL 5 MG PO TABS
5.0000 mg | ORAL_TABLET | ORAL | Status: DC | PRN
  Administered 2021-10-09 – 2021-10-11 (×8): 10 mg via ORAL
  Filled 2021-10-09 (×8): qty 2

## 2021-10-09 NOTE — Progress Notes (Signed)
PROGRESS NOTE Anna [name]  PPI:[id number] DOB: September 08, 1955 DOA: 10/05/2021 PCP: Jethro Bastos, MD   Brief Narrative/Hospital Course: 66 y.o.f w/ history significant of DM, HTN, and HLD presented with L foot infection. She is scheduled for L BKA next week (7/18) by Dr Lajoyce Corners but presented to the ED with significant increase in her left foot swelling tenderness redness along with fever subjective, chills.  In the ED BMP stable CBC with mild leukocytosis lactic acid 1.6 chronic anemia, thrombocytosis 532, sed rate 84.Patient was admitted for amputation.  Patient is having severe pain was added on OxyContin following which she was very drowsy.  Seen by vascular initially contemplating amputation on Monday but seen by Dr. Jason Nest is planning for transtibial amputation on Wednesday 7/19    Subjective: Seen and examined Complaints of low grade temp BP in 100-150s   Assessment and Plan: Principal Problem:   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 [facility]) Active Problems:   Essential hypertension   Type 2 diabetes mellitus (HCC)   OSA on CPAP   Dyslipidemia   Mood disorder (HCC)   Gangrene of toe of left foot (HCC)   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, with surrounding cellulitis PVD: Patient followed by [name]- was scheduled for outpatient BKA 7/18 but presented with worsening pain.  On empiric antibiotics, ongoing severe pain vascular changed pain meds to 2.5 mg oxy every 4 hours, OxyContin discontinued due to drowsiness.  Minimize IV pain meds.  Seen by vascular initially contemplating amputation on Monday but seen by Dr. Jason Nest is planning for transtibial amputation on Wednesday 7/19. Cont aspirin and Plavix, Neurontin.  Essential hypertension: Well-controlled on continuous lisinopril T2DM W/ diabetic neuropathy: Well-controlled on SSI, Semglee 25 units hold if NPO Recent Labs  Lab 10/07/21 2116 10/08/21 0739 10/08/21 1204  10/08/21 1746 10/08/21 2049  GLUCAP 125* 117* 98 184* 114*  OSA on CPAP nightly Dyslipidemia-previously on Crestor but stopped in June as LDL at goal.   Mood disorder/anxiety/RLS: Moderate stable, cont BuSpar, Requip, Prozac, Neurontin, Atarax and Topamax Hepatic steatosis monitor lfts. COPD-not in exacerbation, cont nebs, IS. Chronic anemia-monitor hb periop. Overweight with Body mass index is 29.26 kg/m. : will benefit with weight loss.  DVT prophylaxis: enoxaparin (LOVENOX) injection 40 mg Start: 10/06/21 1000 Code Status:   Code Status: Full Code Family Communication: plan of care discussed with patient at bedside. Patient status is: Inpatient because of ongoing management of critical limb ischemia Level of care: Med-Surg   Dispo: The patient is from: home            Anticipated disposition: TBD, pending left leg amputation   Mobility Assessment (last 72 hours)     Mobility Assessment     Row Name 10/08/21 2100 10/08/21 0828 10/07/21 1000 10/06/21 2045     Does patient have an order for bedrest or is patient medically unstable No - Continue assessment No - Continue assessment No - Continue assessment No - Continue assessment    What is the highest level of mobility based on the progressive mobility assessment? Level 3 (Stands with assist) - Balance while standing  and cannot march in place Level 3 (Stands with assist) - Balance while standing  and cannot march in place Level 2 (Chairfast) - Balance while sitting on edge of bed and cannot stand Level 2 (Chairfast) - Balance while sitting on edge of bed and cannot stand    Is the above level different from baseline mobility  prior to current illness? Yes - Recommend PT order Yes - Recommend PT order Yes - Recommend PT order Yes - Recommend PT order              Objective: Vitals last 24 hrs: Vitals:   10/08/21 1700 10/08/21 1941 10/09/21 0024 10/09/21 0416  BP: (!) 106/57 (!) 103/50  (!) 147/64  Pulse: 76 72 68 80  Resp:  18 16 18 16   Temp: 99 F (37.2 C) 98.4 F (36.9 C)  98.1 F (36.7 C)  TempSrc: Oral Oral  Oral  SpO2: 99% 96% 97% 99%  Weight:      Height:       Weight change:   Physical Examination: General exam: AA, older than stated age, weak appearing. HEENT:Oral mucosa moist, Ear/Nose WNL grossly, dentition normal. Respiratory system: bilaterally diminished, no use of accessory muscle Cardiovascular system: S1 & S2 +, No JVD,. Gastrointestinal system: Abdomen soft,NT,ND,BS+ Nervous System:Alert, awake, moving extremities and grossly nonfocal Extremities: Left foot with dry gangrene with mild surrounding erythema Skin: No rashes,no icterus. MSK: Normal muscle bulk,tone, power   Medications reviewed:  Scheduled Meds:  vitamin C  500 mg Oral Daily   aspirin EC  81 mg Oral Daily   busPIRone  25 mg Oral BID   [START ON 10/11/2021] chlorhexidine  60 mL Topical Once   clopidogrel  75 mg Oral Daily   docusate sodium  100 mg Oral BID   enoxaparin (LOVENOX) injection  40 mg Subcutaneous Q24H   feeding supplement (GLUCERNA SHAKE)  237 mL Oral TID BM   FLUoxetine  80 mg Oral Daily   gabapentin  300 mg Oral TID   guaiFENesin  600 mg Oral BID   hydrOXYzine  10 mg Oral BID   insulin aspart  0-15 Units Subcutaneous TID WC   insulin aspart  0-5 Units Subcutaneous QHS   insulin glargine-yfgn  25 Units Subcutaneous QHS   lisinopril  5 mg Oral Daily   multivitamin with minerals  1 tablet Oral Daily   pantoprazole  40 mg Oral BID AC   [START ON 10/11/2021] povidone-iodine  2 Application Topical Once   rOPINIRole  0.25 mg Oral QHS   topiramate  25 mg Oral Daily   [START ON 10/11/2021] tranexamic acid (CYKLOKAPRON) 2,000 mg in sodium chloride 0.9 % 50 mL Topical Application  2,000 mg Topical To OR   zinc sulfate  220 mg Oral Daily   Continuous Infusions:  [START ON 10/11/2021]  ceFAZolin (ANCEF) IV     cefTRIAXone (ROCEPHIN)  IV Stopped (10/08/21 1006)   metronidazole Stopped (10/08/21 2206)    vancomycin Stopped (10/09/21 0305)      Diet Order             Diet Carb Modified Fluid consistency: Thin; Room service appropriate? Yes  Diet effective now                    Nutrition Problem: Increased nutrient needs Etiology: wound healing Signs/Symptoms: estimated needs Interventions: Glucerna shake, MVI   Intake/Output Summary (Last 24 hours) at 10/09/2021 0816 Last data filed at 10/09/2021 0614 Gross per 24 hour  Intake 1680.03 ml  Output 2 ml  Net 1678.03 ml   Net IO Since Admission: 2,068.77 mL [10/09/21 0816]  Wt Readings from Last 3 Encounters:  10/06/21 72.6 kg  08/19/21 73.5 kg  07/30/21 73.5 kg     Unresulted Labs (From admission, onward)    None     Data Reviewed:  I have personally reviewed following labs and imaging studies CBC: Recent Labs  Lab 10/05/21 1437 10/06/21 0500  WBC 11.1* 11.5*  NEUTROABS 6.9 6.6  HGB 11.2* 10.4*  HCT 35.8* 34.5*  MCV 86.5 88.5  PLT 532* 512*   Basic Metabolic Panel: Recent Labs  Lab 10/05/21 1437 10/06/21 0500  NA 138 139  K 4.4 3.8  CL 100 101  CO2 24 25  GLUCOSE 151* 120*  BUN 17 15  CREATININE 0.96 0.98  CALCIUM 9.5 9.1  MG  --  2.2   GFR: Estimated Creatinine Clearance: 52.7 mL/min (by C-G formula based on SCr of 0.98 mg/dL). Liver Function Tests: Recent Labs  Lab 10/05/21 1437 10/06/21 0500  AST 23 22  ALT 16 15  ALKPHOS 71 65  BILITOT 0.6 0.2*  PROT 7.2 6.7  ALBUMIN 3.5 3.3*   No results for input(s): "LIPASE", "AMYLASE" in the last 168 hours. No results for input(s): "AMMONIA" in the last 168 hours. Coagulation Profile: Recent Labs  Lab 10/05/21 1437  INR 1.0   BNP (last 3 results) No results for input(s): "PROBNP" in the last 8760 hours. HbA1C: No results for input(s): "HGBA1C" in the last 72 hours. CBG: Recent Labs  Lab 10/07/21 2116 10/08/21 0739 10/08/21 1204 10/08/21 1746 10/08/21 2049  GLUCAP 125* 117* 98 184* 114*   Lipid Profile: No results for input(s):  "CHOL", "HDL", "LDLCALC", "TRIG", "CHOLHDL", "LDLDIRECT" in the last 72 hours. Thyroid Function Tests: No results for input(s): "TSH", "T4TOTAL", "FREET4", "T3FREE", "THYROIDAB" in the last 72 hours. Sepsis Labs: Recent Labs  Lab 10/05/21 1437 10/06/21 0206  LATICACIDVEN 1.6 0.9    Recent Results (from the past 240 hour(s))  Culture, blood (Routine x 2)     Status: None (Preliminary result)   Collection Time: 10/05/21  2:37 PM   Specimen: BLOOD  Result Value Ref Range Status   Specimen Description BLOOD BLOOD LEFT FOREARM  Final   Special Requests   Final    BOTTLES DRAWN AEROBIC AND ANAEROBIC Blood Culture adequate volume   Culture   Final    NO GROWTH 3 DAYS Performed at [facility], [address] [place], [place] [address]    Report Status PENDING  Incomplete  Culture, blood (Routine x 2)     Status: None (Preliminary result)   Collection Time: 10/05/21  2:47 PM   Specimen: BLOOD  Result Value Ref Range Status   Specimen Description BLOOD SITE NOT SPECIFIED  Final   Special Requests   Final    BOTTLES DRAWN AEROBIC AND ANAEROBIC Blood Culture adequate volume   Culture   Final    NO GROWTH 3 DAYS Performed at [facility], [address] [place], [place] [address]    Report Status PENDING  Incomplete  Surgical pcr screen     Status: None   Collection Time: 10/07/21  5:40 AM   Specimen: Nasal Mucosa; Nasal Swab  Result Value Ref Range Status   MRSA, PCR NEGATIVE NEGATIVE Final   Staphylococcus aureus NEGATIVE NEGATIVE Final    Comment: (NOTE) The Xpert SA Assay (FDA approved for NASAL specimens in patients 80 years of age and older), is one component of a comprehensive surveillance program. It is not intended to diagnose infection nor to guide or monitor treatment. Performed at [facility], [address] [place], [place] [address]     Antimicrobials: Anti-infectives (From admission, onward)    Start     Dose/Rate Route Frequency  Ordered  Stop   10/11/21 0600  ceFAZolin (ANCEF) IVPB 2g/100 mL premix        2 g 200 mL/hr over 30 Minutes Intravenous On call to O.R. 10/06/21 2011 10/12/21 0559   10/07/21 0600  vancomycin (VANCOREADY) IVPB 1250 mg/250 mL  Status:  Discontinued        1,250 mg 166.7 mL/hr over 90 Minutes Intravenous Every 24 hours 10/06/21 0458 10/06/21 1005   10/07/21 0300  vancomycin (VANCOCIN) IVPB 1000 mg/200 mL premix        1,000 mg 200 mL/hr over 60 Minutes Intravenous Every 24 hours 10/06/21 1005     10/06/21 1000  cefTRIAXone (ROCEPHIN) 2 g in sodium chloride 0.9 % 100 mL IVPB        2 g 200 mL/hr over 30 Minutes Intravenous Every 24 hours 10/06/21 0849 10/13/21 0959   10/06/21 1000  metroNIDAZOLE (FLAGYL) IVPB 500 mg        500 mg 100 mL/hr over 60 Minutes Intravenous Every 12 hours 10/06/21 0849 10/13/21 0959   10/06/21 0315  piperacillin-tazobactam (ZOSYN) IVPB 3.375 g        3.375 g 100 mL/hr over 30 Minutes Intravenous  Once 10/06/21 0305 10/06/21 0610   10/06/21 0115  vancomycin (VANCOREADY) IVPB 1500 mg/300 mL        1,500 mg 150 mL/hr over 120 Minutes Intravenous  Once 10/06/21 0110 10/06/21 0454     Culture/Microbiology    Component Value Date/Time   SDES BLOOD SITE NOT SPECIFIED 10/05/2021 1447   SPECREQUEST  10/05/2021 1447    BOTTLES DRAWN AEROBIC AND ANAEROBIC Blood Culture adequate volume   CULT  10/05/2021 1447    NO GROWTH 3 DAYS Performed at [facility], [address] [place], [place] [address]    REPTSTATUS PENDING 10/05/2021 1447  Radiology Studies: No results found.  LOS: 3 days  Lanae Boast, MD [facility]  10/09/2021, 8:16 AM

## 2021-10-09 NOTE — Plan of Care (Signed)
  Problem: Education: Goal: Knowledge of General Education information will improve Description: Including pain rating scale, medication(s)/side effects and non-pharmacologic comfort measures Outcome: Progressing   Problem: Clinical Measurements: Goal: Ability to maintain clinical measurements within normal limits will improve Outcome: Progressing   Problem: Activity: Goal: Risk for activity intolerance will decrease Outcome: Progressing   Problem: Nutrition: Goal: Adequate nutrition will be maintained Outcome: Progressing   Problem: Coping: Goal: Level of anxiety will decrease Outcome: Progressing   Problem: Pain Managment: Goal: General experience of comfort will improve Outcome: Progressing   Problem: Safety: Goal: Ability to remain free from injury will improve Outcome: Progressing   Problem: Skin Integrity: Goal: Risk for impaired skin integrity will decrease Outcome: Progressing   

## 2021-10-09 NOTE — Progress Notes (Signed)
Office Visit Note   Patient: Anna [name]           Date of Birth: 04-19-1955           MRN: [id number] Visit Date: 09/28/2021              Requested by: No referring provider defined for this encounter. PCP: Jethro Bastos, MD  Chief Complaint  Patient presents with   Left Foot - Pain    S/p surgery with Dr. Ardelle Anton 08/24/2021 deb of wound and 5th toe MTH amputation       HPI: Patient is a 66 year old woman who is seen for evaluation for critical limb ischemia left lower extremity 4 weeks status post fifth ray amputation on 08/24/2021.  Patient also has undergone balloon angioplasty with Dr. Durene Cal with balloon angioplasty of the anterior tibial artery patient does not have further revascularization options.  Assessment & Plan: Visit Diagnoses:  1. 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 [facility])     Plan: With patient's critical limb ischemia and progressive gangrenous changes despite revascularization I have recommended patient proceed with a transtibial amputation.  Patient states that she would like to proceed with surgery in about a week.  Follow-Up Instructions: Return in about 2 weeks (around 10/12/2021).   Ortho Exam  Patient is alert, oriented, no adenopathy, well-dressed, normal affect, normal respiratory effort. Examination patient has dry gangrenous changes to the left great toe with a dry gangrenous wound over the fifth ray amputation.  Patient does not have palpable pulses.  Patient has rest ischemic pain.  Patient has pain to light touch of the left foot.  There is no ascending cellulitis no purulent drainage.   Imaging: No results found.    Labs: Lab Results  Component Value Date   HGBA1C 8.2 (H) 08/20/2021   ESRSEDRATE 84 (H) 10/06/2021   ESRSEDRATE 37 (H) 08/19/2021   CRP 0.8 10/06/2021   CRP <0.5 08/19/2021   REPTSTATUS PENDING 10/05/2021   CULT  10/05/2021    NO GROWTH 3 DAYS Performed at [facility],  [address], [place], [place] [address]    Leanor Kail SPECIES 05/18/2011     Lab Results  Component Value Date   ALBUMIN 3.3 (L) 10/06/2021   ALBUMIN 3.5 10/05/2021   ALBUMIN 2.8 (L) 08/26/2021   PREALBUMIN 20 10/06/2021   PREALBUMIN 19.0 08/20/2021    Lab Results  Component Value Date   MG 2.2 10/06/2021   MG 1.8 08/20/2021   No results found for: "VD25OH"  Lab Results  Component Value Date   PREALBUMIN 20 10/06/2021   PREALBUMIN 19.0 08/20/2021      Latest Ref Rng & Units 10/06/2021    5:00 AM 10/05/2021    2:37 PM 09/20/2021    3:35 PM  CBC EXTENDED  WBC 4.0 - 10.5 K/uL 11.5  11.1  10.4   RBC 3.87 - 5.11 MIL/uL 3.90  4.14  4.03   Hemoglobin 12.0 - 15.0 g/dL 62.1  30.8  65.7   HCT 36.0 - 46.0 % 34.5  35.8  35.5   Platelets 150 - 400 K/uL 512  532  343   NEUT# 1.7 - 7.7 K/uL 6.6  6.9  5.4   Lymph# 0.7 - 4.0 K/uL 3.3  3.0  3.6      There is no height or weight on file to calculate BMI.  Orders:  No orders of the defined types were placed in this encounter.  No orders of the defined types were placed in this encounter.    Procedures: No procedures performed  Clinical Data: No additional findings.  ROS:  All other systems negative, except as noted in the HPI. Review of Systems  Objective: Vital Signs: There were no vitals taken for this visit.  Specialty Comments:  No specialty comments available.  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) 10/06/2021   Dyslipidemia 10/06/2021   Mood disorder (HCC) 10/06/2021   Cellulitis 08/19/2021   Hyperkalemia 08/19/2021   OSA on CPAP 08/19/2021   Pyelonephritis 06/24/2014   Diabetic neuropathy (HCC)    Essential hypertension    Type 2 diabetes mellitus (HCC)    COPD mixed type (HCC)    Acute pyelonephritis 06/23/2014   Past Medical History:  Diagnosis Date    Arthritis    Asthma    Back pain    Bronchitis    Bursitis    Diabetes mellitus    Diabetic neuropathy (HCC)    Diabetic retinopathy    Hyperlipemia    Hypertension    Migraine    Obesity    Sepsis due to urinary tract infection (HCC)     History reviewed. No pertinent family history.  Past Surgical History:  Procedure Laterality Date   ABDOMINAL AORTOGRAM W/LOWER EXTREMITY Left 08/22/2021   Procedure: ABDOMINAL AORTOGRAM W/LOWER EXTREMITY;  Surgeon: Nada Libman, MD;  Location: [facility] INVASIVE [facility];  Service: Cardiovascular;  Laterality: Left;   CESAREAN SECTION     x3   CHOLECYSTECTOMY     EYE SURGERY Left    METATARSAL HEAD EXCISION Left 08/24/2021   Procedure: METATARSAL HEAD EXCISION FIFTH TOE;  Surgeon: Vivi Barrack, DPM;  Location: [facility] OR;  Service: Podiatry;  Laterality: Left;   PERIPHERAL VASCULAR BALLOON ANGIOPLASTY  08/22/2021   Procedure: PERIPHERAL VASCULAR BALLOON ANGIOPLASTY;  Surgeon: Nada Libman, MD;  Location: [facility];  Service: Cardiovascular;;  Left AT   WOUND DEBRIDEMENT Left 08/24/2021   Procedure: DEBRIDEMENT WOUND OF LEFT FOOT;  Surgeon: Vivi Barrack, DPM;  Location: [facility] OR;  Service: Podiatry;  Laterality: Left;   Social History   Occupational History   Not on file  Tobacco Use   Smoking status: Never   Smokeless tobacco: Never  Substance and Sexual Activity   Alcohol use: No   Drug use: No   Sexual activity: Not on file

## 2021-10-09 NOTE — Progress Notes (Signed)
[facility]  Subjective  - pain in the left foot is sever   Objective (!) 147/64 80 98.1 F (36.7 C) (Oral) 16 99%  Intake/Output Summary (Last 24 hours) at 10/09/2021 0715 Last data filed at 10/09/2021 6160 Gross per 24 hour  Intake 1680.03 ml  Output 2 ml  Net 1678.03 ml         Assessment/Planning: Ischemic left foot without revascularization options. [name] plans for  left transtibial amputation on Wednesday 10/11/21 Changed pain medication changed to 2 5 mg oxy q 4 and asked not to use IV pain medication if possible.   Anna [name] 10/09/2021 7:15 AM --  Laboratory Lab Results: No results for input(s): "WBC", "HGB", "HCT", "PLT" in the last 72 hours. BMET No results for input(s): "NA", "K", "CL", "CO2", "GLUCOSE", "BUN", "CREATININE", "CALCIUM" in the last 72 hours.  COAG Lab Results  Component Value Date   INR 1.0 10/05/2021   No results found for: "PTT"

## 2021-10-09 NOTE — Care Management Important Message (Signed)
Important Message  Patient Details  Name: Anna [name] MRN: [id number] Date of Birth: December 07, 1955   Medicare Important Message Given:  Yes     [name] 10/09/2021, 3:44 PM

## 2021-10-09 NOTE — Progress Notes (Signed)
Pt placed on CPAP for bed. RT will cont to monitor as needed.  

## 2021-10-10 ENCOUNTER — Telehealth: Payer: Self-pay | Admitting: *Deleted

## 2021-10-10 DIAGNOSIS — I70262 Atherosclerosis of native arteries of extremities with gangrene, left leg: Secondary | ICD-10-CM | POA: Diagnosis not present

## 2021-10-10 LAB — BASIC METABOLIC PANEL
Anion gap: 8 (ref 5–15)
BUN: 16 mg/dL (ref 8–23)
CO2: 24 mmol/L (ref 22–32)
Calcium: 8.5 mg/dL — ABNORMAL LOW (ref 8.9–10.3)
Chloride: 104 mmol/L (ref 98–111)
Creatinine, Ser: 0.9 mg/dL (ref 0.44–1.00)
GFR, Estimated: >60 mL/min (ref >60)
Glucose, Bld: 104 mg/dL — ABNORMAL HIGH (ref 70–99)
Potassium: 4.1 mmol/L (ref 3.5–5.1)
Sodium: 136 mmol/L (ref 135–145)

## 2021-10-10 LAB — CULTURE, BLOOD (ROUTINE X 2)
Culture: NO GROWTH
Culture: NO GROWTH
Special Requests: ADEQUATE
Special Requests: ADEQUATE

## 2021-10-10 LAB — GLUCOSE, CAPILLARY
Glucose-Capillary: 104 mg/dL — ABNORMAL HIGH (ref 70–99)
Glucose-Capillary: 129 mg/dL — ABNORMAL HIGH (ref 70–99)
Glucose-Capillary: 178 mg/dL — ABNORMAL HIGH (ref 70–99)
Glucose-Capillary: 107 mg/dL — ABNORMAL HIGH (ref 70–99)

## 2021-10-10 MED ORDER — INSULIN GLARGINE-YFGN 100 UNIT/ML ~~LOC~~ SOLN
10.0000 [IU] | Freq: Every day | SUBCUTANEOUS | Status: DC
  Administered 2021-10-10 – 2021-10-11 (×2): 10 [IU] via SUBCUTANEOUS
  Filled 2021-10-10 (×4): qty 0.1

## 2021-10-10 MED ORDER — CEFAZOLIN SODIUM-DEXTROSE 2-4 GM/100ML-% IV SOLN
2.0000 g | INTRAVENOUS | Status: DC
  Filled 2021-10-10: qty 100

## 2021-10-10 MED ORDER — TRANEXAMIC ACID 1000 MG/10ML IV SOLN
2000.0000 mg | INTRAVENOUS | Status: DC
  Filled 2021-10-10: qty 20

## 2021-10-10 NOTE — Telephone Encounter (Signed)
-----   Message from Vivi Barrack, DPM sent at 09/24/2021  3:01 PM EDT ----- Skarlet Lyons- I put in for home health if you can please follow up on this for me. Thank you.

## 2021-10-10 NOTE — Telephone Encounter (Signed)
Patient has been hospitalized, saw Dr Lajoyce Corners on 09/28/21,scheduled for below the(left) knee amputation scheduled.

## 2021-10-10 NOTE — Progress Notes (Signed)
PROGRESS NOTE Anna [name]  YYT:[id number] DOB: 05-Jul-1955 DOA: 10/05/2021 PCP: Jethro Bastos, MD   Brief Narrative/Hospital Course: 66 y.o.f w/ history significant of DM, HTN, and HLD presented with L foot infection. She is scheduled for L BKA next week (7/18) by Dr Lajoyce Corners but presented to the ED with significant increase in her left foot swelling tenderness redness along with fever subjective, chills.  In the ED BMP stable CBC with mild leukocytosis lactic acid 1.6 chronic anemia, thrombocytosis 532, sed rate 84.Patient was admitted for amputation.  Patient is having severe pain was added on OxyContin following which she was very drowsy.  Seen by vascular initially contemplating amputation on Monday but seen by Dr. Jason Nest is planning for transtibial amputation on Wednesday 7/19    Subjective: Seen and examined. Complains of ongoing pain and crying Overnight patient afebrile BP stable,BMP stable this morning    Assessment and Plan: Principal Problem:   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 [facility]) Active Problems:   Essential hypertension   Type 2 diabetes mellitus (HCC)   OSA on CPAP   Dyslipidemia   Mood disorder (HCC)   Gangrene of toe of left foot (HCC)   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, with surrounding cellulitis PVD: Patient followed by [name]- was scheduled for outpatient BKA 7/18 but presented with worsening pain.  On empiric antibiotics, ongoing severe pain vascular changed pain meds to 2.5 mg oxy every 4 hours, OxyContin discontinued due to drowsiness.  Minimize IV pain meds.  Seen by vascular initially contemplating amputation on Monday but seen by Dr. Jason Nest is planning for transtibial amputation on Wednesday 7/19. Cont aspirin and Plavix, Neurontin.  Continue pain control  Essential hypertension: Controlled on lisinopril T2DM W/ diabetic neuropathy: Controlled on SSI, on Semglee 25 units nightly- will cut to 10  u tonight, resume home dose post op Recent Labs  Lab 10/09/21 0821 10/09/21 1201 10/09/21 1710 10/09/21 2101 10/10/21 0749  GLUCAP 95 140* 159* 162* 104*  OSA cont CPAP nightly Dyslipidemia-previously on Crestor but stopped in June as LDL at goal.   Mood disorder/anxiety/RLS: Mood stable on BuSpar, Requip, Prozac, Neurontin, Atarax and Topamax Hepatic steatosis monitor lfts. COPD-not in exacerbation, cont nebs,IS. Chronic anemia-monitor hb periop. Overweight with Body mass index is 29.26 kg/m. : will benefit with weight loss.  DVT prophylaxis: enoxaparin (LOVENOX) injection 40 mg Start: 10/06/21 1000 Code Status:   Code Status: Full Code Family Communication: plan of care discussed with patient at bedside. Patient status is: Inpatient because of ongoing management of critical limb ischemia Level of care: Med-Surg   Dispo: The patient is from: home            Anticipated disposition: TBD, pending left leg amputation   Mobility Assessment (last 72 hours)     Mobility Assessment     Row Name 10/09/21 2200 10/09/21 0900 10/08/21 2100 10/08/21 0828     Does patient have an order for bedrest or is patient medically unstable No - Continue assessment No - Continue assessment No - Continue assessment No - Continue assessment    What is the highest level of mobility based on the progressive mobility assessment? Level 3 (Stands with assist) - Balance while standing  and cannot march in place Level 3 (Stands with assist) - Balance while standing  and cannot march in place Level 3 (Stands with assist) - Balance while standing  and cannot march in place Level 3 (Stands with assist) -  Balance while standing  and cannot march in place    Is the above level different from baseline mobility prior to current illness? Yes - Recommend PT order Yes - Recommend PT order Yes - Recommend PT order Yes - Recommend PT order              Objective: Vitals last 24 hrs: Vitals:   10/09/21 2008  10/09/21 2349 10/10/21 0600 10/10/21 0742  BP: (!) 130/57  (!) 161/75 (!) 133/57  Pulse: 83 82 75   Resp: 16 15 18 19   Temp: 98.3 F (36.8 C)   99.2 F (37.3 C)  TempSrc:    Oral  SpO2: 97% 97% 98% 99%  Weight:      Height:       Weight change:   Physical Examination: General exam: AA, older than stated age, weak appearing. HEENT:Oral mucosa moist, Ear/Nose WNL grossly, dentition normal. Respiratory system: bilaterally diminished, no use of accessory muscle Cardiovascular system: S1 & S2 +, No JVD,. Gastrointestinal system: Abdomen soft,NT,ND,BS+ Nervous System:Alert, awake, moving extremities and grossly nonfocal Extremities: lle foot w/ dry gangrene  as below Skin: No rashes,no icterus. MSK: Normal muscle bulk,tone, power   Medications reviewed:  Scheduled Meds:  vitamin C  500 mg Oral Daily   aspirin EC  81 mg Oral Daily   busPIRone  25 mg Oral BID   [START ON 10/11/2021] chlorhexidine  60 mL Topical Once   clopidogrel  75 mg Oral Daily   docusate sodium  100 mg Oral BID   enoxaparin (LOVENOX) injection  40 mg Subcutaneous Q24H   feeding supplement (GLUCERNA SHAKE)  237 mL Oral TID BM   FLUoxetine  80 mg Oral Daily   gabapentin  300 mg Oral TID   guaiFENesin  600 mg Oral BID   hydrOXYzine  10 mg Oral BID   insulin aspart  0-15 Units Subcutaneous TID WC   insulin aspart  0-5 Units Subcutaneous QHS   insulin glargine-yfgn  10 Units Subcutaneous QHS   lisinopril  5 mg Oral Daily   multivitamin with minerals  1 tablet Oral Daily   pantoprazole  40 mg Oral BID AC   [START ON 10/11/2021] povidone-iodine  2 Application Topical Once   rOPINIRole  0.25 mg Oral QHS   topiramate  25 mg Oral Daily   [START ON 10/11/2021] tranexamic acid (CYKLOKAPRON) 2,000 mg in sodium chloride 0.9 % 50 mL Topical Application  2,000 mg Topical To OR   zinc sulfate  220 mg Oral Daily   Continuous Infusions:  [START ON 10/11/2021]  ceFAZolin (ANCEF) IV     cefTRIAXone (ROCEPHIN)  IV 2 g  (10/10/21 0837)   metronidazole 500 mg (10/10/21 1020)   vancomycin 1,000 mg (10/10/21 0226)      Diet Order             Diet Carb Modified Fluid consistency: Thin; Room service appropriate? Yes  Diet effective now                    Nutrition Problem: Increased nutrient needs Etiology: wound healing Signs/Symptoms: estimated needs Interventions: Glucerna shake, MVI   Intake/Output Summary (Last 24 hours) at 10/10/2021 1056 Last data filed at 10/10/2021 0900 Gross per 24 hour  Intake 240 ml  Output --  Net 240 ml   Net IO Since Admission: 948.77 mL [10/10/21 1056]  Wt Readings from Last 3 Encounters:  10/06/21 72.6 kg  08/19/21 73.5 kg  07/30/21 73.5 kg  Unresulted Labs (From admission, onward)    None     Data Reviewed: I have personally reviewed following labs and imaging studies CBC: Recent Labs  Lab 10/05/21 1437 10/06/21 0500  WBC 11.1* 11.5*  NEUTROABS 6.9 6.6  HGB 11.2* 10.4*  HCT 35.8* 34.5*  MCV 86.5 88.5  PLT 532* 512*   Basic Metabolic Panel: Recent Labs  Lab 10/05/21 1437 10/06/21 0500 10/10/21 0239  NA 138 139 136  K 4.4 3.8 4.1  CL 100 101 104  CO2 24 25 24   GLUCOSE 151* 120* 104*  BUN 17 15 16   CREATININE 0.96 0.98 0.90  CALCIUM 9.5 9.1 8.5*  MG  --  2.2  --    GFR: Estimated Creatinine Clearance: 57.4 mL/min (by C-G formula based on SCr of 0.9 mg/dL). Liver Function Tests: Recent Labs  Lab 10/05/21 1437 10/06/21 0500  AST 23 22  ALT 16 15  ALKPHOS 71 65  BILITOT 0.6 0.2*  PROT 7.2 6.7  ALBUMIN 3.5 3.3*   No results for input(s): "LIPASE", "AMYLASE" in the last 168 hours. No results for input(s): "AMMONIA" in the last 168 hours. Coagulation Profile: Recent Labs  Lab 10/05/21 1437  INR 1.0   BNP (last 3 results) No results for input(s): "PROBNP" in the last 8760 hours. HbA1C: No results for input(s): "HGBA1C" in the last 72 hours. CBG: Recent Labs  Lab 10/09/21 0821 10/09/21 1201 10/09/21 1710  10/09/21 2101 10/10/21 0749  GLUCAP 95 140* 159* 162* 104*   Lipid Profile: No results for input(s): "CHOL", "HDL", "LDLCALC", "TRIG", "CHOLHDL", "LDLDIRECT" in the last 72 hours. Thyroid Function Tests: No results for input(s): "TSH", "T4TOTAL", "FREET4", "T3FREE", "THYROIDAB" in the last 72 hours. Sepsis Labs: Recent Labs  Lab 10/05/21 1437 10/06/21 0206  LATICACIDVEN 1.6 0.9    Recent Results (from the past 240 hour(s))  Culture, blood (Routine x 2)     Status: None (Preliminary result)   Collection Time: 10/05/21  2:37 PM   Specimen: BLOOD  Result Value Ref Range Status   Specimen Description BLOOD BLOOD LEFT FOREARM  Final   Special Requests   Final    BOTTLES DRAWN AEROBIC AND ANAEROBIC Blood Culture adequate volume   Culture   Final    NO GROWTH 4 DAYS Performed at [facility], [address] [place], [address] [place]    Report Status PENDING  Incomplete  Culture, blood (Routine x 2)     Status: None (Preliminary result)   Collection Time: 10/05/21  2:47 PM   Specimen: BLOOD  Result Value Ref Range Status   Specimen Description BLOOD SITE NOT SPECIFIED  Final   Special Requests   Final    BOTTLES DRAWN AEROBIC AND ANAEROBIC Blood Culture adequate volume   Culture   Final    NO GROWTH 4 DAYS Performed at [facility], [address], [place], [address] [place]    Report Status PENDING  Incomplete  Surgical pcr screen     Status: None   Collection Time: 10/07/21  5:40 AM   Specimen: Nasal Mucosa; Nasal Swab  Result Value Ref Range Status   MRSA, PCR NEGATIVE NEGATIVE Final   Staphylococcus aureus NEGATIVE NEGATIVE Final    Comment: (NOTE) The Xpert SA Assay (FDA approved for NASAL specimens in patients 75 years of age and older), is one component of a comprehensive surveillance program. It is not intended to diagnose infection nor to guide or monitor treatment. Performed at [facility]  [facility], [address], [place], [place] [address]      Antimicrobials: Anti-infectives (From admission, onward)    Start     Dose/Rate Route Frequency Ordered Stop   10/11/21 0600  ceFAZolin (ANCEF) IVPB 2g/100 mL premix        2 g 200 mL/hr over 30 Minutes Intravenous On call to O.R. 10/06/21 2011 10/12/21 0559   10/07/21 0600  vancomycin (VANCOREADY) IVPB 1250 mg/250 mL  Status:  Discontinued        1,250 mg 166.7 mL/hr over 90 Minutes Intravenous Every 24 hours 10/06/21 0458 10/06/21 1005   10/07/21 0300  vancomycin (VANCOCIN) IVPB 1000 mg/200 mL premix        1,000 mg 200 mL/hr over 60 Minutes Intravenous Every 24 hours 10/06/21 1005     10/06/21 1000  cefTRIAXone (ROCEPHIN) 2 g in sodium chloride 0.9 % 100 mL IVPB        2 g 200 mL/hr over 30 Minutes Intravenous Every 24 hours 10/06/21 0849 10/13/21 0859   10/06/21 1000  metroNIDAZOLE (FLAGYL) IVPB 500 mg        500 mg 100 mL/hr over 60 Minutes Intravenous Every 12 hours 10/06/21 0849 10/13/21 0959   10/06/21 0315  piperacillin-tazobactam (ZOSYN) IVPB 3.375 g        3.375 g 100 mL/hr over 30 Minutes Intravenous  Once 10/06/21 0305 10/06/21 0610   10/06/21 0115  vancomycin (VANCOREADY) IVPB 1500 mg/300 mL        1,500 mg 150 mL/hr over 120 Minutes Intravenous  Once 10/06/21 0110 10/06/21 0454     Culture/Microbiology    Component Value Date/Time   SDES BLOOD SITE NOT SPECIFIED 10/05/2021 1447   SPECREQUEST  10/05/2021 1447    BOTTLES DRAWN AEROBIC AND ANAEROBIC Blood Culture adequate volume   CULT  10/05/2021 1447    NO GROWTH 4 DAYS Performed at [facility], [address] [place], [place] [address]    REPTSTATUS PENDING 10/05/2021 1447  Radiology Studies: No results found.  LOS: 4 days  Lanae Boast, MD [facility]  10/10/2021, 10:56 AM

## 2021-10-10 NOTE — Progress Notes (Signed)
Pharmacy Antibiotic Note  Anna [name] is a 66 y.o. female admitted on 10/05/2021 with  LLE cellulitis .  Pharmacy was  consulted on 7/14 for Vancomycin dosing.  Day #5 vancomycin.  SCr is stable 0.90.   Blood cultures x2 negative.  7/15 surgical MRSA PCR: negative Plan L BKA on Wed 7/19 at ~2pm  Plan: Continue Vancomycin to 1g IV q24h. Goal AUC 400-550. Expected AUC: 461 SCr used: 0.98 Ceftriaxone 2g IV q24h per MD Flagyl 500mg  IV q12h per MD Monitor clinical progress, c/s, renal function F/u de-escalation plan/LOT, vancomycin levels as indicated.   Follow up post op tomorrow, will consider checking vanc levels if vanc to continue >24 hrs post op   Height: 5\' 2"  (157.5 cm) Weight: 72.6 kg (160 lb) IBW/kg (Calculated) : 50.1  Temp (24hrs), Avg:98.7 F (37.1 C), Min:98.3 F (36.8 C), Max:99.2 F (37.3 C)  Recent Labs  Lab 10/05/21 1437 10/06/21 0206 10/06/21 0500 10/10/21 0239  WBC 11.1*  --  11.5*  --   CREATININE 0.96  --  0.98 0.90  LATICACIDVEN 1.6 0.9  --   --      Estimated Creatinine Clearance: 57.4 mL/min (by C-G formula based on SCr of 0.9 mg/dL).    Allergies  Allergen Reactions   Bacitracin     Other reaction(s): Other Burns skin     Citalopram Diarrhea   Doxycycline Other (See Comments)    Other reaction(s): Other "burning" all over body     Morphine And Related Hives   Neomycin     Other reaction(s): Other (See Comments), Unknown unknown    Ciprofloxacin     Tongue swells   Ciprofloxacin-Dexamethasone Other (See Comments)    unknown    Duloxetine     Other reaction(s): Mental Status Changes (intolerance) "saw pictures of gun"   Latex Itching   Levaquin [Levofloxacin In D5w] Other (See Comments)    Pt does not remember reaction but states she's had issues with med   Piper     Other reaction(s): Other (See Comments) White pepper- uncontrollable sneezing    Salvia Officinalis     Other reaction(s): Other (See Comments) Sneezing    Soma [Carisoprodol]     Sleepy and constipation   Lasix [Furosemide] Rash    Thank you for allowing pharmacy to be part of this patients care team. 10/08/21, RPh Clinical Pharmacist  10/10/2021 5:59 PM

## 2021-10-11 ENCOUNTER — Encounter (HOSPITAL_COMMUNITY)
Admission: EM | Disposition: A | Discharge status: Skilled Nursing Facility | Payer: Self-pay | Source: Home / Self Care | Attending: Internal Medicine

## 2021-10-11 ENCOUNTER — Inpatient Hospital Stay (HOSPITAL_COMMUNITY): Payer: Medicare (Managed Care)

## 2021-10-11 ENCOUNTER — Inpatient Hospital Stay (HOSPITAL_COMMUNITY)
Admission: RE | Admit: 2021-10-11 | Payer: Medicare (Managed Care) | Source: Ambulatory Visit | Admitting: Orthopedic Surgery

## 2021-10-11 ENCOUNTER — Other Ambulatory Visit: Payer: Self-pay

## 2021-10-11 ENCOUNTER — Encounter (HOSPITAL_COMMUNITY): Payer: Self-pay | Admitting: Internal Medicine

## 2021-10-11 DIAGNOSIS — I70262 Atherosclerosis of native arteries of extremities with gangrene, left leg: Secondary | ICD-10-CM | POA: Diagnosis not present

## 2021-10-11 DIAGNOSIS — Z9989 Dependence on other enabling machines and devices: Secondary | ICD-10-CM

## 2021-10-11 DIAGNOSIS — Z794 Long term (current) use of insulin: Secondary | ICD-10-CM

## 2021-10-11 DIAGNOSIS — G4733 Obstructive sleep apnea (adult) (pediatric): Secondary | ICD-10-CM

## 2021-10-11 DIAGNOSIS — E1152 Type 2 diabetes mellitus with diabetic peripheral angiopathy with gangrene: Secondary | ICD-10-CM

## 2021-10-11 HISTORY — PX: AMPUTATION: SHX166

## 2021-10-11 LAB — GLUCOSE, CAPILLARY
Glucose-Capillary: 100 mg/dL — ABNORMAL HIGH (ref 70–99)
Glucose-Capillary: 106 mg/dL — ABNORMAL HIGH (ref 70–99)
Glucose-Capillary: 112 mg/dL — ABNORMAL HIGH (ref 70–99)
Glucose-Capillary: 240 mg/dL — ABNORMAL HIGH (ref 70–99)
Glucose-Capillary: 400 mg/dL — ABNORMAL HIGH (ref 70–99)

## 2021-10-11 SURGERY — AMPUTATION BELOW KNEE
Anesthesia: Regional | Site: Knee | Laterality: Left

## 2021-10-11 MED ORDER — ONDANSETRON HCL 4 MG/2ML IJ SOLN
INTRAMUSCULAR | Status: AC
  Filled 2021-10-11: qty 2

## 2021-10-11 MED ORDER — MAGNESIUM SULFATE 2 GM/50ML IV SOLN: 2.0000 g | Freq: Every day | INTRAVENOUS | Status: DC | PRN

## 2021-10-11 MED ORDER — FENTANYL CITRATE (PF) 100 MCG/2ML IJ SOLN
INTRAMUSCULAR | Status: AC
  Administered 2021-10-11: 50 ug via INTRAVENOUS
  Filled 2021-10-11: qty 2

## 2021-10-11 MED ORDER — MAGNESIUM CITRATE PO SOLN: 1.0000 | Freq: Once | ORAL | Status: DC | PRN

## 2021-10-11 MED ORDER — BISACODYL 5 MG PO TBEC
5.0000 mg | DELAYED_RELEASE_TABLET | Freq: Every day | ORAL | Status: DC | PRN
  Administered 2021-10-12: 5 mg via ORAL
  Filled 2021-10-11: qty 1

## 2021-10-11 MED ORDER — LIDOCAINE 2% (20 MG/ML) 5 ML SYRINGE
INTRAMUSCULAR | Status: DC | PRN
  Administered 2021-10-11: 10 mg via INTRAVENOUS

## 2021-10-11 MED ORDER — DEXAMETHASONE SODIUM PHOSPHATE 10 MG/ML IJ SOLN
INTRAMUSCULAR | Status: DC | PRN
  Administered 2021-10-11 (×2): 5 mg

## 2021-10-11 MED ORDER — HYDRALAZINE HCL 20 MG/ML IJ SOLN: 5.0000 mg | INTRAMUSCULAR | Status: DC | PRN

## 2021-10-11 MED ORDER — ONDANSETRON HCL 4 MG/2ML IJ SOLN
4.0000 mg | Freq: Four times a day (QID) | INTRAMUSCULAR | Status: DC | PRN
  Administered 2021-10-13: 4 mg via INTRAVENOUS
  Filled 2021-10-11: qty 2

## 2021-10-11 MED ORDER — HYDROMORPHONE HCL 1 MG/ML IJ SOLN
0.5000 mg | INTRAMUSCULAR | Status: DC | PRN
  Administered 2021-10-11 – 2021-10-13 (×7): 1 mg via INTRAVENOUS
  Filled 2021-10-11 (×7): qty 1

## 2021-10-11 MED ORDER — SODIUM CHLORIDE 0.9 % IV SOLN
INTRAVENOUS | Status: DC
Start: 2021-10-11 — End: 2021-10-13

## 2021-10-11 MED ORDER — TRANEXAMIC ACID-NACL 1000-0.7 MG/100ML-% IV SOLN
INTRAVENOUS | Status: DC | PRN
  Administered 2021-10-11: 1000 mg via INTRAVENOUS

## 2021-10-11 MED ORDER — DEXAMETHASONE SODIUM PHOSPHATE 10 MG/ML IJ SOLN
INTRAMUSCULAR | Status: DC | PRN
  Administered 2021-10-11: 5 mg via INTRAVENOUS

## 2021-10-11 MED ORDER — PHENYLEPHRINE 80 MCG/ML (10ML) SYRINGE FOR IV PUSH (FOR BLOOD PRESSURE SUPPORT)
PREFILLED_SYRINGE | INTRAVENOUS | Status: DC | PRN
  Administered 2021-10-11 (×4): 80 ug via INTRAVENOUS

## 2021-10-11 MED ORDER — DOCUSATE SODIUM 100 MG PO CAPS
100.0000 mg | ORAL_CAPSULE | Freq: Every day | ORAL | Status: DC
  Administered 2021-10-12 – 2021-10-13 (×2): 100 mg via ORAL
  Filled 2021-10-11 (×2): qty 1

## 2021-10-11 MED ORDER — ASCORBIC ACID 500 MG PO TABS
1000.0000 mg | ORAL_TABLET | Freq: Every day | ORAL | Status: DC
  Administered 2021-10-11 – 2021-10-13 (×3): 1000 mg via ORAL
  Filled 2021-10-11 (×3): qty 2

## 2021-10-11 MED ORDER — FENTANYL CITRATE (PF) 250 MCG/5ML IJ SOLN
INTRAMUSCULAR | Status: AC
  Filled 2021-10-11: qty 5

## 2021-10-11 MED ORDER — PANTOPRAZOLE SODIUM 40 MG PO TBEC: 40.0000 mg | DELAYED_RELEASE_TABLET | Freq: Every day | ORAL | Status: DC

## 2021-10-11 MED ORDER — METOPROLOL TARTRATE 5 MG/5ML IV SOLN: 2.0000 mg | INTRAVENOUS | Status: DC | PRN

## 2021-10-11 MED ORDER — LIDOCAINE 2% (20 MG/ML) 5 ML SYRINGE
INTRAMUSCULAR | Status: AC
  Filled 2021-10-11: qty 5

## 2021-10-11 MED ORDER — PROPOFOL 10 MG/ML IV BOLUS
INTRAVENOUS | Status: DC | PRN
  Administered 2021-10-11: 100 mg via INTRAVENOUS

## 2021-10-11 MED ORDER — ORAL CARE MOUTH RINSE: 15.0000 mL | Freq: Once | OROMUCOSAL | Status: AC

## 2021-10-11 MED ORDER — CEFAZOLIN SODIUM-DEXTROSE 2-4 GM/100ML-% IV SOLN: 2.0000 g | Freq: Three times a day (TID) | INTRAVENOUS | Status: DC

## 2021-10-11 MED ORDER — JUVEN PO PACK
1.0000 | PACK | Freq: Two times a day (BID) | ORAL | Status: DC
  Administered 2021-10-11 – 2021-10-13 (×4): 1 via ORAL
  Filled 2021-10-11 (×3): qty 1

## 2021-10-11 MED ORDER — LABETALOL HCL 5 MG/ML IV SOLN: 10.0000 mg | INTRAVENOUS | Status: DC | PRN

## 2021-10-11 MED ORDER — ONDANSETRON HCL 4 MG/2ML IJ SOLN
4.0000 mg | Freq: Once | INTRAMUSCULAR | Status: AC
  Administered 2021-10-11: 4 mg via INTRAVENOUS

## 2021-10-11 MED ORDER — OXYCODONE HCL 5 MG PO TABS: 5.0000 mg | ORAL_TABLET | ORAL | Status: DC | PRN

## 2021-10-11 MED ORDER — INSULIN ASPART 100 UNIT/ML IJ SOLN: 0.0000 [IU] | INTRAMUSCULAR | Status: DC | PRN

## 2021-10-11 MED ORDER — GUAIFENESIN-DM 100-10 MG/5ML PO SYRP
15.0000 mL | ORAL_SOLUTION | ORAL | Status: DC | PRN
  Administered 2021-10-12 (×3): 15 mL via ORAL
  Filled 2021-10-11 (×3): qty 20

## 2021-10-11 MED ORDER — ONDANSETRON HCL 4 MG/2ML IJ SOLN
INTRAMUSCULAR | Status: DC | PRN
  Administered 2021-10-11: 4 mg via INTRAVENOUS

## 2021-10-11 MED ORDER — POLYETHYLENE GLYCOL 3350 17 G PO PACK: 17.0000 g | PACK | Freq: Every day | ORAL | Status: DC | PRN

## 2021-10-11 MED ORDER — FENTANYL CITRATE (PF) 100 MCG/2ML IJ SOLN: 25.0000 ug | INTRAMUSCULAR | Status: DC | PRN

## 2021-10-11 MED ORDER — MIDAZOLAM HCL 2 MG/2ML IJ SOLN
INTRAMUSCULAR | Status: AC
  Administered 2021-10-11: 2 mg via INTRAVENOUS
  Filled 2021-10-11: qty 2

## 2021-10-11 MED ORDER — PHENYLEPHRINE HCL-NACL 20-0.9 MG/250ML-% IV SOLN
INTRAVENOUS | Status: DC | PRN
  Administered 2021-10-11: 40 ug/min via INTRAVENOUS

## 2021-10-11 MED ORDER — OXYCODONE HCL 5 MG PO TABS
10.0000 mg | ORAL_TABLET | ORAL | Status: DC | PRN
  Administered 2021-10-11 – 2021-10-13 (×5): 15 mg via ORAL
  Filled 2021-10-11 (×5): qty 3

## 2021-10-11 MED ORDER — ACETAMINOPHEN 325 MG PO TABS
325.0000 mg | ORAL_TABLET | Freq: Four times a day (QID) | ORAL | Status: DC | PRN
  Administered 2021-10-12: 650 mg via ORAL
  Filled 2021-10-11: qty 2

## 2021-10-11 MED ORDER — PHENOL 1.4 % MT LIQD: 1.0000 | OROMUCOSAL | Status: DC | PRN

## 2021-10-11 MED ORDER — ACETAMINOPHEN 500 MG PO TABS: 1000.0000 mg | ORAL_TABLET | Freq: Once | ORAL | Status: DC

## 2021-10-11 MED ORDER — POTASSIUM CHLORIDE CRYS ER 20 MEQ PO TBCR: 20.0000 meq | EXTENDED_RELEASE_TABLET | Freq: Every day | ORAL | Status: DC | PRN

## 2021-10-11 MED ORDER — CHLORHEXIDINE GLUCONATE 0.12 % MT SOLN
OROMUCOSAL | Status: AC
  Administered 2021-10-11: 15 mL via OROMUCOSAL
  Filled 2021-10-11: qty 15

## 2021-10-11 MED ORDER — LACTATED RINGERS IV SOLN: INTRAVENOUS | Status: DC

## 2021-10-11 MED ORDER — ROPIVACAINE HCL 5 MG/ML IJ SOLN
INTRAMUSCULAR | Status: DC | PRN
  Administered 2021-10-11: 30 mL via PERINEURAL
  Administered 2021-10-11: 10 mL via PERINEURAL

## 2021-10-11 MED ORDER — ZINC SULFATE 220 (50 ZN) MG PO CAPS
220.0000 mg | ORAL_CAPSULE | Freq: Every day | ORAL | Status: DC
  Administered 2021-10-11 – 2021-10-13 (×3): 220 mg via ORAL
  Filled 2021-10-11 (×3): qty 1

## 2021-10-11 MED ORDER — DEXAMETHASONE SODIUM PHOSPHATE 10 MG/ML IJ SOLN
INTRAMUSCULAR | Status: AC
Start: 2021-10-11 — End: ?
  Filled 2021-10-11: qty 1

## 2021-10-11 MED ORDER — ALUM & MAG HYDROXIDE-SIMETH 200-200-20 MG/5ML PO SUSP: 15.0000 mL | ORAL | Status: DC | PRN

## 2021-10-11 MED ORDER — 0.9 % SODIUM CHLORIDE (POUR BTL) OPTIME
TOPICAL | Status: DC | PRN
  Administered 2021-10-11: 1000 mL

## 2021-10-11 MED ORDER — CHLORHEXIDINE GLUCONATE 0.12 % MT SOLN: 15.0000 mL | Freq: Once | OROMUCOSAL | Status: AC

## 2021-10-11 MED ORDER — MIDAZOLAM HCL 2 MG/2ML IJ SOLN: 2.0000 mg | Freq: Once | INTRAMUSCULAR | Status: AC

## 2021-10-11 MED ORDER — TRANEXAMIC ACID-NACL 1000-0.7 MG/100ML-% IV SOLN
INTRAVENOUS | Status: AC
  Filled 2021-10-11: qty 100

## 2021-10-11 MED ORDER — FENTANYL CITRATE (PF) 100 MCG/2ML IJ SOLN: 50.0000 ug | Freq: Once | INTRAMUSCULAR | Status: AC

## 2021-10-11 SURGICAL SUPPLY — 40 items
BAG COUNTER SPONGE SURGICOUNT (BAG) ×1 IMPLANT
BLADE SAW RECIP 87.9 MT (BLADE) ×2 IMPLANT
BLADE SURG 21 STRL SS (BLADE) ×2 IMPLANT
BNDG COHESIVE 6X5 TAN STRL LF (GAUZE/BANDAGES/DRESSINGS) IMPLANT
CANISTER WOUND CARE 500ML ATS (WOUND CARE) ×2 IMPLANT
COVER SURGICAL LIGHT HANDLE (MISCELLANEOUS) ×2 IMPLANT
CUFF TOURN SGL QUICK 34 (TOURNIQUET CUFF) ×2
CUFF TRNQT CYL 34X4.125X (TOURNIQUET CUFF) ×1 IMPLANT
DRAPE DERMATAC (DRAPES) ×2 IMPLANT
DRAPE INCISE IOBAN 66X45 STRL (DRAPES) ×2 IMPLANT
DRAPE U-SHAPE 47X51 STRL (DRAPES) ×2 IMPLANT
DRESSING PREVENA PLUS CUSTOM (GAUZE/BANDAGES/DRESSINGS) ×1 IMPLANT
DRSG PREVENA PLUS CUSTOM (GAUZE/BANDAGES/DRESSINGS) ×2
DURAPREP 26ML APPLICATOR (WOUND CARE) ×2 IMPLANT
ELECT REM PT RETURN 9FT ADLT (ELECTROSURGICAL) ×2
ELECTRODE REM PT RTRN 9FT ADLT (ELECTROSURGICAL) ×1 IMPLANT
GLOVE BIOGEL PI IND STRL 9 (GLOVE) ×1 IMPLANT
GLOVE BIOGEL PI INDICATOR 9 (GLOVE) ×1
GLOVE SURG ORTHO 9.0 STRL STRW (GLOVE) ×2 IMPLANT
GOWN STRL REUS W/ TWL XL LVL3 (GOWN DISPOSABLE) ×2 IMPLANT
GOWN STRL REUS W/TWL XL LVL3 (GOWN DISPOSABLE) ×4
GRAFT SKIN WND MICRO 38 (Tissue) ×1 IMPLANT
GRAFT SKIN WND OMEGA3 SB 7X10 (Tissue) ×1 IMPLANT
KIT BASIN OR (CUSTOM PROCEDURE TRAY) ×2 IMPLANT
KIT TURNOVER KIT B (KITS) ×2 IMPLANT
MANIFOLD NEPTUNE II (INSTRUMENTS) ×2 IMPLANT
NS IRRIG 1000ML POUR BTL (IV SOLUTION) ×2 IMPLANT
PACK ORTHO EXTREMITY (CUSTOM PROCEDURE TRAY) ×2 IMPLANT
PAD ARMBOARD 7.5X6 YLW CONV (MISCELLANEOUS) ×2 IMPLANT
PREVENA RESTOR ARTHOFORM 46X30 (CANNISTER) ×2 IMPLANT
SPONGE T-LAP 18X18 ~~LOC~~+RFID (SPONGE) ×1 IMPLANT
STAPLER VISISTAT 35W (STAPLE) ×1 IMPLANT
STOCKINETTE IMPERVIOUS LG (DRAPES) ×2 IMPLANT
SUT ETHILON 2 0 PSLX (SUTURE) IMPLANT
SUT SILK 2 0 (SUTURE) ×2
SUT SILK 2-0 18XBRD TIE 12 (SUTURE) ×1 IMPLANT
SUT VIC AB 1 CTX 27 (SUTURE) ×4 IMPLANT
TOWEL GREEN STERILE (TOWEL DISPOSABLE) ×2 IMPLANT
TUBE CONNECTING 12X1/4 (SUCTIONS) ×2 IMPLANT
YANKAUER SUCT BULB TIP NO VENT (SUCTIONS) ×2 IMPLANT

## 2021-10-11 NOTE — Op Note (Signed)
10/11/2021  1:56 PM  PATIENT:  Anna [name]    PRE-OPERATIVE DIAGNOSIS:  Gangrene Left Foot  POST-OPERATIVE DIAGNOSIS:  Same  PROCEDURE:  LEFT BELOW KNEE AMPUTATION Application kerecis tissue graft 108 sq cm Application wound vac  SURGEON:  Nadara Mustard, MD  ANESTHESIA:   General  PREOPERATIVE INDICATIONS:  Anna [name] is a  66 y.o. female with a diagnosis of Gangrene Left Foot who failed conservative measures and elected for surgical management.    The risks benefits and alternatives were discussed with the patient preoperatively including but not limited to the risks of infection, bleeding, nerve injury, cardiopulmonary complications, the need for revision surgery, among others, and the patient was willing to proceed.  OPERATIVE IMPLANTS: kerecis micro 38 sq cm, kerecis 70 sq cm    OPERATIVE FINDINGS: healthy tissue  OPERATIVE PROCEDURE: Patient was brought to the operating room after undergoing a regional anesthetic.  After adequate levels anesthesia were obtained a thigh tourniquet was placed and the lower extremity was prepped using DuraPrep draped into a sterile field. The foot was draped out of the sterile field with impervious stockinette.  A timeout was called and the tourniquet inflated.  A transverse skin incision was made 12 cm distal to the tibial tubercle, the incision curved proximally, and a large posterior flap was created.  The tibia was transected just proximal to the skin incision and beveled anteriorly.  The fibula was transected just proximal to the tibial incision.  The sciatic nerve was pulled cut and allowed to retract.  The vascular bundles were suture ligated with 2-0 silk.  The tourniquet was deflated and hemostasis obtained.    Kerecis powder was applied to the end of the tibia and fibula.  This was covered with a Kerecis sheet.    The deep and superficial fascial layers were closed using #1 Vicryl.  The skin was closed using staples.  The Prevena  customizable dressing was applied this was overwrapped with the arthroform sponge.  Collier Flowers was used to secure the sponges and the circumferential compression was secured to the skin with Dermatac.  This was connected to the wound VAC pump and had a good suction fit this was covered with a stump shrinker and a limb protector.  Patient was taken to the PACU in stable condition.   DISCHARGE PLANNING:  Antibiotic duration: 24-hour antibiotics  Weightbearing: Nonweightbearing on the operative extremity  Pain medication: Opioid pathway  Dressing care/ Wound VAC: Continue wound VAC with the Prevena plus pump at discharge for 1 week  Ambulatory devices: Walker or kneeling scooter  Discharge to: Discharge planning based on recommendations per physical therapy  Follow-up: In the office 1 week after discharge.

## 2021-10-11 NOTE — Progress Notes (Signed)
Inpatient Rehab Admissions Coordinator:   Consult received and chart reviewed.  Note pt with [facility] which is unlikely to approve CIR admission for this patient.  Would recommend TOC pursue other rehab options once therapy has a chance to evaluate and make f/u recommendations.   Estill Dooms, PT, DPT Admissions Coordinator [phone] 10/11/21  2:58 PM

## 2021-10-11 NOTE — Anesthesia Preprocedure Evaluation (Addendum)
Anesthesia Evaluation  Patient identified by MRN, date of birth, ID band Patient awake    Reviewed: Allergy & Precautions, NPO status , Patient's Chart, lab work & pertinent test results  Airway Mallampati: III  TM Distance: >3 FB Neck ROM: Full    Dental  (+) Edentulous Lower, Edentulous Upper, Dental Advisory Given   Pulmonary asthma , sleep apnea and Continuous Positive Airway Pressure Ventilation , COPD,    Pulmonary exam normal breath sounds clear to auscultation       Cardiovascular hypertension, + Peripheral Vascular Disease  Normal cardiovascular exam+ Valvular Problems/Murmurs MR  Rhythm:Regular Rate:Normal  TTE 2023 1. Left ventricular ejection fraction, by estimation, is 60 to 65%. The  left ventricle has normal function. The left ventricle has no regional  wall motion abnormalities. Left ventricular diastolic parameters are  consistent with Grade I diastolic  dysfunction (impaired relaxation).  2. Right ventricular systolic function is normal. The right ventricular  size is normal.  3. The mitral valve is degenerative. Mild to moderate mitral valve  regurgitation.  4. Tricuspid valve regurgitation is moderate.  5. The aortic valve is normal in structure. Aortic valve regurgitation is  mild.  6. Aortic no significant ascending aortic aneurysm.  7. The inferior vena cava is normal in size with greater than 50%  respiratory variability, suggesting right atrial pressure of 3 mmHg.   Neuro/Psych  Headaches, negative psych ROS   GI/Hepatic negative GI ROS, Neg liver ROS,   Endo/Other  diabetes, Type 2, Insulin Dependent  Renal/GU Renal disease  negative genitourinary   Musculoskeletal  (+) Arthritis ,   Abdominal   Peds  Hematology negative hematology ROS (+)   Anesthesia Other Findings   Reproductive/Obstetrics                            Anesthesia Physical Anesthesia  Plan  ASA: 3  Anesthesia Plan: General and Regional   Post-op Pain Management: Regional block* and Tylenol PO (pre-op)*   Induction: Intravenous  PONV Risk Score and Plan: 3 and Ondansetron, Dexamethasone and Midazolam  Airway Management Planned: LMA  Additional Equipment:   Intra-op Plan:   Post-operative Plan: Extubation in OR  Informed Consent: I have reviewed the patients History and Physical, chart, labs and discussed the procedure including the risks, benefits and alternatives for the proposed anesthesia with the patient or authorized representative who has indicated his/her understanding and acceptance.     Dental advisory given  Plan Discussed with: CRNA  Anesthesia Plan Comments:         Anesthesia Quick Evaluation

## 2021-10-11 NOTE — Progress Notes (Signed)
Orthopedic Tech Progress Note Patient Details:  Anna [name] June 16, 1955 [id number]  Patient ID: Lajuana Carry, female   DOB: 04-24-1955, 66 y.o.   MRN: [id number] Order placed with [facility] for BK. Darleen Crocker 10/11/2021, 1:38 PM

## 2021-10-11 NOTE — Interval H&P Note (Signed)
History and Physical Interval Note:  10/11/2021 6:47 AM  Anna [name]  has presented today for surgery, with the diagnosis of Gangrene Left Foot.  The various methods of treatment have been discussed with the patient and family. After consideration of risks, benefits and other options for treatment, the patient has consented to  Procedure(s): LEFT BELOW KNEE AMPUTATION (Left) as a surgical intervention.  The patient's history has been reviewed, patient examined, no change in status, stable for surgery.  I have reviewed the patient's chart and labs.  Questions were answered to the patient's satisfaction.     Nadara Mustard

## 2021-10-11 NOTE — Anesthesia Procedure Notes (Signed)
Anesthesia Regional Block: Adductor canal block   Pre-Anesthetic Checklist: , timeout performed,  Correct Patient, Correct Site, Correct Laterality,  Correct Procedure, Correct Position, site marked,  Risks and benefits discussed,  Pre-op evaluation,  At surgeon's request and post-op pain management  Laterality: Left  Prep: Maximum Sterile Barrier Precautions used, chloraprep       Needles:  Injection technique: Single-shot  Needle Type: Echogenic Stimulator Needle     Needle Length: 9cm  Needle Gauge: 21     Additional Needles:   Procedures:,,,, ultrasound used (permanent image in chart),,    Narrative:  Start time: 10/11/2021 12:35 PM End time: 10/11/2021 12:38 PM Injection made incrementally with aspirations every 5 mL. Anesthesiologist: Elmer Picker, MD

## 2021-10-11 NOTE — Anesthesia Procedure Notes (Signed)
Anesthesia Regional Block: Popliteal block   Pre-Anesthetic Checklist: , timeout performed,  Correct Patient, Correct Site, Correct Laterality,  Correct Procedure, Correct Position, site marked,  Risks and benefits discussed,  Pre-op evaluation,  At surgeon's request and post-op pain management  Laterality: Left  Prep: Maximum Sterile Barrier Precautions used, chloraprep       Needles:  Injection technique: Single-shot  Needle Type: Echogenic Stimulator Needle     Needle Length: 9cm  Needle Gauge: 21     Additional Needles:   Procedures:,,,, ultrasound used (permanent image in chart),,    Narrative:  Start time: 10/11/2021 12:32 PM End time: 10/11/2021 12:35 PM Injection made incrementally with aspirations every 5 mL. Anesthesiologist: Elmer Picker, MD

## 2021-10-11 NOTE — Anesthesia Procedure Notes (Addendum)
Procedure Name: LMA Insertion Date/Time: 10/11/2021 12:52 PM  Performed by: Audie Pinto, CRNAPre-anesthesia Checklist: Patient identified, Emergency Drugs available, Suction available and Patient being monitored Patient Re-evaluated:Patient Re-evaluated prior to induction Oxygen Delivery Method: Circle system utilized Preoxygenation: Pre-oxygenation with 100% oxygen Induction Type: IV induction Ventilation: Mask ventilation without difficulty LMA: LMA inserted LMA Size: 3.0 Number of attempts: 1 Placement Confirmation: positive ETCO2 Tube secured with: Tape Dental Injury: Teeth and Oropharynx as per pre-operative assessment  Comments: Lauren Cozart, SRNA placed LMA under direct supervision.

## 2021-10-11 NOTE — Progress Notes (Signed)
PROGRESS NOTE RAELI WIENS  FYB:[id number] DOB: 03-12-1956 DOA: 10/05/2021 PCP: Jethro Bastos, MD   Brief Narrative/Hospital Course: 66 y.o.f w/ history significant of DM, HTN, and HLD presented with L foot infection. She is scheduled for L BKA next week (7/18) by Dr Lajoyce Corners but presented to the ED with significant increase in her left foot swelling tenderness redness along with fever subjective, chills.  In the ED BMP stable CBC with mild leukocytosis lactic acid 1.6 chronic anemia, thrombocytosis 532, sed rate 84.Patient was admitted for amputation.  Patient is having severe pain was added on OxyContin following which she was very drowsy.  Seen by vascular initially contemplating amputation on Monday but seen by Dr. Jason Nest is planning for transtibial amputation on Wednesday 7/19    Subjective:  Doing fair pain seems moderately controlled at this time Surgery scheduled for 230 this afternoon  Assessment and Plan: Principal Problem:   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 [facility]) Active Problems:   Essential hypertension   Type 2 diabetes mellitus (HCC)   OSA on CPAP   Dyslipidemia   Mood disorder (HCC)   Gangrene of toe of left foot (HCC)   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, with surrounding cellulitis PVD: Patient followed by [name]- was scheduled for outpatient BKA 7/18 but presented with worsening pain.   Continues on vancomycin and Flagyl and ceftriaxone ongoing severe pain vascular changed pain meds to 2.5 mg oxy every 4 Undergoing amputation Wednesday 7/19. Cont aspirin and Plavix, Neurontin.  Continue pain control Essential hypertension: Controlled on lisinopril 5 mg daily T2DM W/ diabetic neuropathy: Controlled on SSI, CBGs ranging 100-1 20, on Semglee 25 units nightly- will cut to 10 perioperatively OSA cont CPAP nightly Dyslipidemia-previously on Crestor but stopped in June as LDL at goal.   Mood  disorder/anxiety/RLS: Mood stable on BuSpar, Requip, Prozac, Neurontin, Atarax and Topamax Hepatic steatosis monitor lfts periodically next check in a.m. COPD-not in exacerbation, cont nebs,IS. Chronic anemia-monitor hb periop. Overweight with Body mass index is 29.26 kg/m. : will benefit with weight loss.  DVT prophylaxis: enoxaparin (LOVENOX) injection 40 mg Start: 10/06/21 1000 Code Status:   Code Status: Full Code Family Communication: plan of care discussed with patient at bedside. Patient status is: Inpatient because of ongoing management of critical limb ischemia Level of care: Med-Surg   Dispo: The patient is from: home            Anticipated disposition: TBD, pending left leg amputation   Mobility Assessment (last 72 hours)     Mobility Assessment     Row Name 10/10/21 2038 10/09/21 2200 10/09/21 0900 10/08/21 2100     Does patient have an order for bedrest or is patient medically unstable No - Continue assessment No - Continue assessment No - Continue assessment No - Continue assessment    What is the highest level of mobility based on the progressive mobility assessment? Level 2 ([place]) - Balance while sitting on edge of bed and cannot stand Level 3 (Stands with assist) - Balance while standing  and cannot march in place Level 3 (Stands with assist) - Balance while standing  and cannot march in place Level 3 (Stands with assist) - Balance while standing  and cannot march in place    Is the above level different from baseline mobility prior to current illness? Yes - Recommend PT order Yes - Recommend PT order Yes - Recommend PT order Yes - Recommend PT order  Objective: Vitals last 24 hrs: Vitals:   10/10/21 1317 10/10/21 1937 10/11/21 0600 10/11/21 0749  BP: (!) 148/62 (!) 108/58 114/62 (!) 121/57  Pulse: 79 76 71 73  Resp: 17 16 16    Temp: 98.5 F (36.9 C) 98.9 F (37.2 C)  98.5 F (36.9 C)  TempSrc: Oral Oral  Oral  SpO2: 100% 99% 98% 99%   Weight:      Height:       Weight change:   Physical Examination:  Awake coherent white female looks about stated age No icterus no pallor Chest is clear no added sound no rales rhonchi or wheeze No lower extremity edema I did not examine wound S1-S2 no murmur Neurologically intact no focal deficit  Medications reviewed:  Scheduled Meds:  vitamin C  500 mg Oral Daily   aspirin EC  81 mg Oral Daily   busPIRone  25 mg Oral BID   clopidogrel  75 mg Oral Daily   docusate sodium  100 mg Oral BID   enoxaparin (LOVENOX) injection  40 mg Subcutaneous Q24H   feeding supplement (GLUCERNA SHAKE)  237 mL Oral TID BM   FLUoxetine  80 mg Oral Daily   gabapentin  300 mg Oral TID   guaiFENesin  600 mg Oral BID   hydrOXYzine  10 mg Oral BID   insulin aspart  0-15 Units Subcutaneous TID WC   insulin aspart  0-5 Units Subcutaneous QHS   insulin glargine-yfgn  10 Units Subcutaneous QHS   lisinopril  5 mg Oral Daily   multivitamin with minerals  1 tablet Oral Daily   pantoprazole  40 mg Oral BID AC   povidone-iodine  2 Application Topical Once   rOPINIRole  0.25 mg Oral QHS   topiramate  25 mg Oral Daily   tranexamic acid (CYKLOKAPRON) 2,000 mg in sodium chloride 0.9 % 50 mL Topical Application  2,000 mg Topical To OR   tranexamic acid (CYKLOKAPRON) 2,000 mg in sodium chloride 0.9 % 50 mL Topical Application  2,000 mg Topical To OR   zinc sulfate  220 mg Oral Daily   Continuous Infusions:   ceFAZolin (ANCEF) IV      ceFAZolin (ANCEF) IV     cefTRIAXone (ROCEPHIN)  IV 2 g (10/11/21 1004)   metronidazole 500 mg (10/10/21 2127)   vancomycin 1,000 mg (10/11/21 0151)      Diet Order             Diet NPO time specified Except for: Sips with Meds  Diet effective ____                    Nutrition Problem: Increased nutrient needs Etiology: wound healing Signs/Symptoms: estimated needs Interventions: Glucerna shake, MVI  No intake or output data in the 24 hours ending 10/11/21  1041  Net IO Since Admission: 948.77 mL [10/11/21 1041]  Wt Readings from Last 3 Encounters:  10/06/21 72.6 kg  08/19/21 73.5 kg  07/30/21 73.5 kg     Unresulted Labs (From admission, onward)    None     Data Reviewed: I have personally reviewed following labs and imaging studies CBC: Recent Labs  Lab 10/05/21 1437 10/06/21 0500  WBC 11.1* 11.5*  NEUTROABS 6.9 6.6  HGB 11.2* 10.4*  HCT 35.8* 34.5*  MCV 86.5 88.5  PLT 532* 512*    Basic Metabolic Panel: Recent Labs  Lab 10/05/21 1437 10/06/21 0500 10/10/21 0239  NA 138 139 136  K 4.4 3.8 4.1  CL  100 101 104  CO2 24 25 24   GLUCOSE 151* 120* 104*  BUN 17 15 16   CREATININE 0.96 0.98 0.90  CALCIUM 9.5 9.1 8.5*  MG  --  2.2  --     GFR: Estimated Creatinine Clearance: 57.4 mL/min (by C-G formula based on SCr of 0.9 mg/dL). Liver Function Tests: Recent Labs  Lab 10/05/21 1437 10/06/21 0500  AST 23 22  ALT 16 15  ALKPHOS 71 65  BILITOT 0.6 0.2*  PROT 7.2 6.7  ALBUMIN 3.5 3.3*    No results for input(s): "LIPASE", "AMYLASE" in the last 168 hours. No results for input(s): "AMMONIA" in the last 168 hours. Coagulation Profile: Recent Labs  Lab 10/05/21 1437  INR 1.0    BNP (last 3 results) No results for input(s): "PROBNP" in the last 8760 hours. HbA1C: No results for input(s): "HGBA1C" in the last 72 hours. CBG: Recent Labs  Lab 10/10/21 0749 10/10/21 1120 10/10/21 1609 10/10/21 2114 10/11/21 0751  GLUCAP 104* 129* 178* 107* 100*    Lipid Profile: No results for input(s): "CHOL", "HDL", "LDLCALC", "TRIG", "CHOLHDL", "LDLDIRECT" in the last 72 hours. Thyroid Function Tests: No results for input(s): "TSH", "T4TOTAL", "FREET4", "T3FREE", "THYROIDAB" in the last 72 hours. Sepsis Labs: Recent Labs  Lab 10/05/21 1437 10/06/21 0206  LATICACIDVEN 1.6 0.9     Recent Results (from the past 240 hour(s))  Culture, blood (Routine x 2)     Status: None   Collection Time: 10/05/21  2:37 PM    Specimen: BLOOD  Result Value Ref Range Status   Specimen Description BLOOD BLOOD LEFT FOREARM  Final   Special Requests   Final    BOTTLES DRAWN AEROBIC AND ANAEROBIC Blood Culture adequate volume   Culture   Final    NO GROWTH 5 DAYS Performed at [facility], [address] [place], [address] [place]    Report Status 10/10/2021 FINAL  Final  Culture, blood (Routine x 2)     Status: None   Collection Time: 10/05/21  2:47 PM   Specimen: BLOOD  Result Value Ref Range Status   Specimen Description BLOOD SITE NOT SPECIFIED  Final   Special Requests   Final    BOTTLES DRAWN AEROBIC AND ANAEROBIC Blood Culture adequate volume   Culture   Final    NO GROWTH 5 DAYS Performed at [facility], [address] [place], [address] [place]    Report Status 10/10/2021 FINAL  Final  Surgical pcr screen     Status: None   Collection Time: 10/07/21  5:40 AM   Specimen: Nasal Mucosa; Nasal Swab  Result Value Ref Range Status   MRSA, PCR NEGATIVE NEGATIVE Final   Staphylococcus aureus NEGATIVE NEGATIVE Final    Comment: (NOTE) The Xpert SA Assay (FDA approved for NASAL specimens in patients 99 years of age and older), is one component of a comprehensive surveillance program. It is not intended to diagnose infection nor to guide or monitor treatment. Performed at [facility], [address], [place], [address] [place]     Antimicrobials: Anti-infectives (From admission, onward)    Start     Dose/Rate Route Frequency Ordered Stop   10/11/21 0600  ceFAZolin (ANCEF) IVPB 2g/100 mL premix        2 g 200 mL/hr over 30 Minutes Intravenous On call to O.R. 10/06/21 2011 10/12/21 0559   10/11/21 0600  ceFAZolin (ANCEF) IVPB 2g/100 mL premix  2 g 200 mL/hr over 30 Minutes Intravenous On call to O.R. 10/10/21 1844 10/12/21 0559   10/07/21 0600  vancomycin (VANCOREADY) IVPB 1250 mg/250 mL  Status:  Discontinued        1,250 mg 166.7 mL/hr over 90 Minutes Intravenous Every  24 hours 10/06/21 0458 10/06/21 1005   10/07/21 0300  vancomycin (VANCOCIN) IVPB 1000 mg/200 mL premix        1,000 mg 200 mL/hr over 60 Minutes Intravenous Every 24 hours 10/06/21 1005     10/06/21 1000  cefTRIAXone (ROCEPHIN) 2 g in sodium chloride 0.9 % 100 mL IVPB        2 g 200 mL/hr over 30 Minutes Intravenous Every 24 hours 10/06/21 0849 10/13/21 0859   10/06/21 1000  metroNIDAZOLE (FLAGYL) IVPB 500 mg        500 mg 100 mL/hr over 60 Minutes Intravenous Every 12 hours 10/06/21 0849 10/13/21 0959   10/06/21 0315  piperacillin-tazobactam (ZOSYN) IVPB 3.375 g        3.375 g 100 mL/hr over 30 Minutes Intravenous  Once 10/06/21 0305 10/06/21 0610   10/06/21 0115  vancomycin (VANCOREADY) IVPB 1500 mg/300 mL        1,500 mg 150 mL/hr over 120 Minutes Intravenous  Once 10/06/21 0110 10/06/21 0454     Culture/Microbiology    Component Value Date/Time   SDES BLOOD SITE NOT SPECIFIED 10/05/2021 1447   SPECREQUEST  10/05/2021 1447    BOTTLES DRAWN AEROBIC AND ANAEROBIC Blood Culture adequate volume   CULT  10/05/2021 1447    NO GROWTH 5 DAYS Performed at [facility], [address] [place], [place] [address]    REPTSTATUS 10/10/2021 FINAL 10/05/2021 1447  Radiology Studies: No results found.  LOS: 5 days  Rhetta Mura, MD [facility]  10/11/2021, 10:41 AM

## 2021-10-11 NOTE — Anesthesia Postprocedure Evaluation (Signed)
Anesthesia Post Note  Patient: Anna [name]  Procedure(s) Performed: LEFT BELOW KNEE AMPUTATION (Left: Knee)     Patient location during evaluation: PACU Anesthesia Type: Regional and General Level of consciousness: awake and alert Pain management: pain level controlled Vital Signs Assessment: post-procedure vital signs reviewed and stable Respiratory status: spontaneous breathing, nonlabored ventilation, respiratory function stable and patient connected to nasal cannula oxygen Cardiovascular status: blood pressure returned to baseline and stable Postop Assessment: no apparent nausea or vomiting Anesthetic complications: no   No notable events documented.  Last Vitals:  Vitals:   10/11/21 1400 10/11/21 1449  BP: (!) 112/51 118/79  Pulse: 75 77  Resp: 12   Temp:  37.2 C  SpO2: 97% 100%    Last Pain:  Vitals:   10/11/21 1449  TempSrc: Oral  PainSc:                  Anna [name]

## 2021-10-11 NOTE — Transfer of Care (Signed)
Immediate Anesthesia Transfer of Care Note  Patient: Anna [name]  Procedure(s) Performed: LEFT BELOW KNEE AMPUTATION (Left: Knee)  Patient Location: PACU  Anesthesia Type:General and Regional  Level of Consciousness: drowsy  Airway & Oxygen Therapy: Patient Spontanous Breathing and Patient connected to face mask oxygen  Post-op Assessment: Report given to RN and Post -op Vital signs reviewed and stable  Post vital signs: Reviewed and stable  Last Vitals:  Vitals Value Taken Time  BP 98/46 10/11/21 1334  Temp    Pulse 65 10/11/21 1337  Resp 10 10/11/21 1337  SpO2 100 % 10/11/21 1337  Vitals shown include unvalidated device data.  Last Pain:  Vitals:   10/11/21 1235  TempSrc:   PainSc: 0-No pain      Patients Stated Pain Goal: 2 (10/11/21 0954)  Complications: No notable events documented.

## 2021-10-12 DIAGNOSIS — I70262 Atherosclerosis of native arteries of extremities with gangrene, left leg: Secondary | ICD-10-CM | POA: Diagnosis not present

## 2021-10-12 LAB — CBC WITH DIFFERENTIAL/PLATELET
Abs Immature Granulocytes: 0.07 10*3/uL (ref 0.00–0.07)
Basophils Absolute: 0 10*3/uL (ref 0.0–0.1)
Basophils Relative: 0 %
Eosinophils Absolute: 0 10*3/uL (ref 0.0–0.5)
Eosinophils Relative: 0 %
HCT: 32.7 % — ABNORMAL LOW (ref 36.0–46.0)
Hemoglobin: 10.1 g/dL — ABNORMAL LOW (ref 12.0–15.0)
Immature Granulocytes: 1 %
Lymphocytes Relative: 5 %
Lymphs Abs: 0.7 10*3/uL (ref 0.7–4.0)
MCH: 27 pg (ref 26.0–34.0)
MCHC: 30.9 g/dL (ref 30.0–36.0)
MCV: 87.4 fL (ref 80.0–100.0)
Monocytes Absolute: 0.8 10*3/uL (ref 0.1–1.0)
Monocytes Relative: 6 %
Neutro Abs: 13.3 10*3/uL — ABNORMAL HIGH (ref 1.7–7.7)
Neutrophils Relative %: 88 %
Platelets: 400 10*3/uL (ref 150–400)
RBC: 3.74 MIL/uL — ABNORMAL LOW (ref 3.87–5.11)
RDW: 15 % (ref 11.5–15.5)
WBC: 14.8 10*3/uL — ABNORMAL HIGH (ref 4.0–10.5)
nRBC: 0 % (ref 0.0–0.2)

## 2021-10-12 LAB — COMPREHENSIVE METABOLIC PANEL
ALT: 44 U/L (ref 0–44)
AST: 61 U/L — ABNORMAL HIGH (ref 15–41)
Albumin: 2.7 g/dL — ABNORMAL LOW (ref 3.5–5.0)
Alkaline Phosphatase: 207 U/L — ABNORMAL HIGH (ref 38–126)
Anion gap: 10 (ref 5–15)
BUN: 35 mg/dL — ABNORMAL HIGH (ref 8–23)
CO2: 22 mmol/L (ref 22–32)
Calcium: 8.6 mg/dL — ABNORMAL LOW (ref 8.9–10.3)
Chloride: 103 mmol/L (ref 98–111)
Creatinine, Ser: 0.9 mg/dL (ref 0.44–1.00)
GFR, Estimated: >60 mL/min (ref >60)
Glucose, Bld: 437 mg/dL — ABNORMAL HIGH (ref 70–99)
Potassium: 5.1 mmol/L (ref 3.5–5.1)
Sodium: 135 mmol/L (ref 135–145)
Total Bilirubin: 0.4 mg/dL (ref 0.3–1.2)
Total Protein: 6.1 g/dL — ABNORMAL LOW (ref 6.5–8.1)

## 2021-10-12 LAB — GLUCOSE, CAPILLARY
Glucose-Capillary: 368 mg/dL — ABNORMAL HIGH (ref 70–99)
Glucose-Capillary: 205 mg/dL — ABNORMAL HIGH (ref 70–99)
Glucose-Capillary: 123 mg/dL — ABNORMAL HIGH (ref 70–99)
Glucose-Capillary: 80 mg/dL (ref 70–99)

## 2021-10-12 MED ORDER — INSULIN GLARGINE-YFGN 100 UNIT/ML ~~LOC~~ SOLN
25.0000 [IU] | Freq: Every day | SUBCUTANEOUS | Status: DC
  Administered 2021-10-12: 25 [IU] via SUBCUTANEOUS
  Filled 2021-10-12 (×3): qty 0.25

## 2021-10-12 MED ORDER — SODIUM CHLORIDE 0.9 % IV SOLN: INTRAVENOUS | Status: DC

## 2021-10-12 NOTE — Progress Notes (Signed)
Patient ID: Anna [name], female   DOB: 1955-04-22, 66 y.o.   MRN: [id number] Patient is postoperative day 1 left below-knee amputation.  Patient states that she is in less pain than she was prior to surgery.  There is no drainage in the wound VAC canister there is a good suction fit.  Anticipate discharge to inpatient or outpatient rehab.

## 2021-10-12 NOTE — Plan of Care (Signed)
  Problem: Education: Goal: Ability to describe self-care measures that may prevent or decrease complications (Diabetes Survival Skills Education) will improve Outcome: Progressing Goal: Individualized Educational Video(s) Outcome: Progressing   Problem: Coping: Goal: Ability to adjust to condition or change in health will improve Outcome: Progressing   Problem: Fluid Volume: Goal: Ability to maintain a balanced intake and output will improve Outcome: Progressing   Problem: Health Behavior/Discharge Planning: Goal: Ability to identify and utilize available resources and services will improve Outcome: Progressing Goal: Ability to manage health-related needs will improve Outcome: Progressing   Problem: Metabolic: Goal: Ability to maintain appropriate glucose levels will improve Outcome: Progressing   Problem: Nutritional: Goal: Maintenance of adequate nutrition will improve Outcome: Progressing Goal: Progress toward achieving an optimal weight will improve Outcome: Progressing   Problem: Skin Integrity: Goal: Risk for impaired skin integrity will decrease Outcome: Progressing   Problem: Tissue Perfusion: Goal: Adequacy of tissue perfusion will improve Outcome: Progressing   Problem: Education: Goal: Knowledge of General Education information will improve Description: Including pain rating scale, medication(s)/side effects and non-pharmacologic comfort measures Outcome: Progressing   Problem: Health Behavior/Discharge Planning: Goal: Ability to manage health-related needs will improve Outcome: Progressing   Problem: Clinical Measurements: Goal: Ability to maintain clinical measurements within normal limits will improve Outcome: Progressing Goal: Will remain free from infection Outcome: Progressing Goal: Diagnostic test results will improve Outcome: Progressing Goal: Respiratory complications will improve Outcome: Progressing Goal: Cardiovascular complication will  be avoided Outcome: Progressing   Problem: Activity: Goal: Risk for activity intolerance will decrease Outcome: Progressing   Problem: Nutrition: Goal: Adequate nutrition will be maintained Outcome: Progressing   Problem: Coping: Goal: Level of anxiety will decrease Outcome: Progressing   Problem: Elimination: Goal: Will not experience complications related to bowel motility Outcome: Progressing Goal: Will not experience complications related to urinary retention Outcome: Progressing   Problem: Pain Managment: Goal: General experience of comfort will improve Outcome: Progressing   Problem: Safety: Goal: Ability to remain free from injury will improve Outcome: Progressing   Problem: Skin Integrity: Goal: Risk for impaired skin integrity will decrease Outcome: Progressing   Problem: Education: Goal: Knowledge of General Education information will improve Description: Including pain rating scale, medication(s)/side effects and non-pharmacologic comfort measures Outcome: Progressing   Problem: Health Behavior/Discharge Planning: Goal: Ability to manage health-related needs will improve Outcome: Progressing   Problem: Clinical Measurements: Goal: Ability to maintain clinical measurements within normal limits will improve Outcome: Progressing Goal: Will remain free from infection Outcome: Progressing Goal: Diagnostic test results will improve Outcome: Progressing Goal: Respiratory complications will improve Outcome: Progressing Goal: Cardiovascular complication will be avoided Outcome: Progressing   Problem: Activity: Goal: Risk for activity intolerance will decrease Outcome: Progressing   Problem: Nutrition: Goal: Adequate nutrition will be maintained Outcome: Progressing   Problem: Coping: Goal: Level of anxiety will decrease Outcome: Progressing   Problem: Elimination: Goal: Will not experience complications related to bowel motility Outcome:  Progressing Goal: Will not experience complications related to urinary retention Outcome: Progressing   Problem: Pain Managment: Goal: General experience of comfort will improve Outcome: Progressing   Problem: Safety: Goal: Ability to remain free from injury will improve Outcome: Progressing   Problem: Skin Integrity: Goal: Risk for impaired skin integrity will decrease Outcome: Progressing   Problem: Education: Goal: Knowledge of the prescribed therapeutic regimen will improve Outcome: Progressing Goal: Ability to verbalize activity precautions or restrictions will improve Outcome: Progressing Goal: Understanding of discharge needs will improve Outcome: Progressing

## 2021-10-12 NOTE — Progress Notes (Signed)
   RE:   Anna [name]      Date of Birth: 02-01-2056      Date:   10/12/21       To Whom It May Concern:  Please be advised that the above-named patient will require a short-term nursing home stay - anticipated 30 days or less for rehabilitation and strengthening.  The plan is for return home.                 MD signature                Date

## 2021-10-12 NOTE — TOC Initial Note (Addendum)
Transition of Care [facility]) - Initial/Assessment Note    Patient Details  Name: Anna [name] MRN: [id number] Date of Birth: 09-05-1955  [facility] CM/SW Contact:    Joanne Chars, LCSW Phone Number: 10/12/2021, 2:08 PM  Clinical Narrative:   CSW met with pt regarding DC recommendation for SNF.  Pt agreeable to this, choice discussed for [facility] pts, permission given to send out referral in hub.  Permission given to speak with sons Lanny Hurst and Wille Glaser.  Pt lives with son Matt Holmes of [facility] services.  Pt is vaccinated for covid with multiple boosters.                 TC message left with [facility] CSW.  1500: TC [facility].  They will approve SNF. She had spoken with pt prior to admit about [facility], waiting to here back from them now.  [facility] can transport to SNF when ready.     Expected Discharge Plan: Skilled Nursing Facility Barriers to Discharge: Continued Medical Work up, SNF Pending bed offer   Patient Goals and CMS Choice Patient states their goals for this hospitalization and ongoing recovery are:: "back to normal"   Choice offered to / list presented to : Patient  Expected Discharge Plan and Services Expected Discharge Plan: [place] In-house Referral: Clinical Social Work   Post Acute Care Choice: [place] Living arrangements for the past 2 months: [place]                                      Prior Living Arrangements/Services Living arrangements for the past 2 months: Single Family Home Lives with:: Adult Children (lives with son Lanny Hurst) Patient language and need for interpreter reviewed:: Yes Do you feel safe going back to the place where you live?: Yes      Need for Family Participation in Patient Care: No (Comment) Care giver support system in place?: Yes (comment) Current home services: Other (comment) ([facility]) Criminal Activity/Legal Involvement Pertinent to Current  Situation/Hospitalization: No - Comment as needed  Activities of Daily Living Home Assistive Devices/Equipment: CBG Meter, Eyeglasses, Wheelchair ADL Screening (condition at time of admission) Patient's cognitive ability adequate to safely complete daily activities?: Yes Is the patient deaf or have difficulty hearing?: Yes Does the patient have difficulty seeing, even when wearing glasses/contacts?: No Does the patient have difficulty concentrating, remembering, or making decisions?: No Patient able to express need for assistance with ADLs?: Yes Does the patient have difficulty dressing or bathing?: Yes Independently performs ADLs?: No Communication: Independent Dressing (OT): Needs assistance Is this a change from baseline?: Pre-admission baseline Grooming: Needs assistance Is this a change from baseline?: Pre-admission baseline Feeding: Independent Bathing: Needs assistance Is this a change from baseline?: Pre-admission baseline Toileting: Needs assistance Is this a change from baseline?: Pre-admission baseline In/Out Bed: Needs assistance Is this a change from baseline?: Pre-admission baseline Walks in Home: Needs assistance Is this a change from baseline?: Pre-admission baseline Does the patient have difficulty walking or climbing stairs?: Yes Weakness of Legs: Left Weakness of Arms/Hands: None  Permission Sought/Granted Permission sought to share information with : Family Supports Permission granted to share information with : Yes, Verbal Permission Granted  Share Information with NAME: sons: Jones Broom, granddaughter Cloyde Reams  Permission granted to share info w AGENCY: SNF        Emotional Assessment Appearance:: Appears  stated age Attitude/Demeanor/Rapport: Engaged Affect (typically observed): Appropriate, Pleasant Orientation: : Oriented to Self, Oriented to Place, Oriented to  Time, Oriented to Situation Alcohol / Substance Use: Not Applicable Psych Involvement: No  (comment)  Admission diagnosis:  Cellulitis of left foot [L03.116] Cellulitis of left lower extremity [L03.116] 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 ([place]) [I70.262] Patient Active Problem List   Diagnosis Date Noted   Gangrene of toe of left foot ([place])    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 ([place]) 10/06/2021   Dyslipidemia 10/06/2021   Mood disorder ([place]) 10/06/2021   Cellulitis 08/19/2021   Hyperkalemia 08/19/2021   OSA on CPAP 08/19/2021   Pyelonephritis 06/24/2014   Diabetic neuropathy ([place])    Essential hypertension    Type 2 diabetes mellitus ([place])    COPD mixed type ([place])    Acute pyelonephritis 06/23/2014   PCP:  Janifer Adie, MD Pharmacy:   [place], [place] - [address] [place] [address] Phone: [phone] Fax: [phone]     Social Determinants of Health (SDOH) Interventions    Readmission Risk Interventions     No data to display

## 2021-10-12 NOTE — Progress Notes (Addendum)
PROGRESS NOTE Anna [name]  ION:[id number] DOB: [birth year] DOA: 10/05/2021 PCP: Jethro Bastos, MD   Brief Narrative/Hospital Course: 66 y.o.f w/ history significant of DM, HTN, and HLD presented with L foot infection. She is scheduled for L BKA next week (7/18) by Dr Lajoyce Corners but presented to the ED with significant increase in her left foot swelling tenderness redness along with fever subjective, chills.  In the ED BMP stable CBC with mild leukocytosis lactic acid 1.6 chronic anemia, thrombocytosis 532, sed rate 84.Patient was admitted for amputation.  Patient is having severe pain was added on OxyContin following which she was very drowsy.  Seen by vascular initially contemplating amputation on Monday but seen by Dr. Jason Nest is planning for transtibial amputation on Wednesday 7/19    Subjective:  Fair complains of spasm Doesn't think the robaxin is helping Asking about baclofen No fever chills  Assessment and Plan: Principal Problem:   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 [facility]) Active Problems:   Essential hypertension   Type 2 diabetes mellitus (HCC)   OSA on CPAP   Dyslipidemia   Mood disorder (HCC)   Gangrene of toe of left foot (HCC)   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, with surrounding cellulitis PVD: S/p L sided BKA 7/19 Patient followed by [name]- was scheduled for outpatient BKA 7/18 but presented with worsening pain.   Continues on vancomycin and Flagyl and ceftriaxone--did d/c Abx 7/20 Continue  2.5 mg oxy every 4 Cont aspirin and Plavix, Neurontin.   Probably needs skilled placement AKI Hyperkalemia: BUN slight up, also K 5.1--stop K replacement--give IVF today Essential hypertension: Controlled on lisinopril 5 mg daily T2DM W/ diabetic neuropathy: Controlled on SSI, CBGs ranging 300-400 increase LA insulin back to 25 U OSA cont CPAP nightly Dyslipidemia-previously on Crestor but stopped in June as LDL at  goal.   Mood disorder/anxiety/RLS: Mood stable on BuSpar, Requip, Prozac, Neurontin, Atarax and Topamax Hepatic steatosis monitor lfts periodically--seems stabilized COPD-not in exacerbation, cont nebs,IS. Chronic anemia-monitor hb periop. Overweight with Body mass index is 29.26 kg/m. : will benefit with weight loss.  DVT prophylaxis: SCD's Start: 10/11/21 1449 enoxaparin (LOVENOX) injection 40 mg Start: 10/06/21 1000 Code Status:   Code Status: Full Code Family Communication: plan of care discussed with patient at bedside. Patient status is: Inpatient because of ongoing management of critical limb ischemia Level of care: Med-Surg   Dispo: The patient is from: home            Anticipated disposition: TBD, pending left leg amputation   Mobility Assessment (last 72 hours)     Mobility Assessment     Row Name 10/12/21 0934 10/12/21 0914 10/11/21 1945 10/11/21 0800 10/10/21 2038   Does patient have an order for bedrest or is patient medically unstable -- -- No - Continue assessment No - Continue assessment No - Continue assessment   What is the highest level of mobility based on the progressive mobility assessment? Level 3 (Stands with assist) - Balance while standing  and cannot march in place Level 3 (Stands with assist) - Balance while standing  and cannot march in place Level 2 (Chairfast) - Balance while sitting on edge of bed and cannot stand Level 2 (Chairfast) - Balance while sitting on edge of bed and cannot stand Level 2 (Chairfast) - Balance while sitting on edge of bed and cannot stand   Is the above level different from baseline mobility prior to current illness? -- --  Yes - Recommend PT order Yes - Recommend PT order Yes - Recommend PT order    Row Name 10/09/21 2200           Does patient have an order for bedrest or is patient medically unstable No - Continue assessment       What is the highest level of mobility based on the progressive mobility assessment? Level 3  (Stands with assist) - Balance while standing  and cannot march in place       Is the above level different from baseline mobility prior to current illness? Yes - Recommend PT order                 Objective: Vitals last 24 hrs: Vitals:   10/11/21 2343 10/11/21 2352 10/12/21 0754 10/12/21 0800  BP:  112/78 (!) 123/55   Pulse:  80 (!) 103   Resp:  18    Temp:  99 F (37.2 C) 98.2 F (36.8 C)   TempSrc:   Oral   SpO2: 99% 94% (!) 84% 99%  Weight:      Height:       Weight change:   Physical Examination:  Coherent in nad looks about stated age No icterus no pallor Chest is clear no added sound no rales rhonchi or wheeze No lower extremity edema I did not examine wound S1-S2 no murmur Neurologically intact no focal deficit  Medications reviewed:  Scheduled Meds:  vitamin C  1,000 mg Oral Daily   aspirin EC  81 mg Oral Daily   busPIRone  25 mg Oral BID   clopidogrel  75 mg Oral Daily   docusate sodium  100 mg Oral Daily   enoxaparin (LOVENOX) injection  40 mg Subcutaneous Q24H   feeding supplement (GLUCERNA SHAKE)  237 mL Oral TID BM   FLUoxetine  80 mg Oral Daily   gabapentin  300 mg Oral TID   guaiFENesin  600 mg Oral BID   hydrOXYzine  10 mg Oral BID   insulin aspart  0-15 Units Subcutaneous TID WC   insulin aspart  0-5 Units Subcutaneous QHS   insulin glargine-yfgn  10 Units Subcutaneous QHS   lisinopril  5 mg Oral Daily   multivitamin with minerals  1 tablet Oral Daily   nutrition supplement (JUVEN)  1 packet Oral BID BM   pantoprazole  40 mg Oral BID AC   rOPINIRole  0.25 mg Oral QHS   topiramate  25 mg Oral Daily   zinc sulfate  220 mg Oral Daily   Continuous Infusions:  sodium chloride Stopped (10/12/21 1138)   sodium chloride Stopped (10/12/21 1139)   magnesium sulfate bolus IVPB     metronidazole 500 mg (10/12/21 0936)      Diet Order             Diet Carb Modified Fluid consistency: Thin; Room service appropriate? Yes  Diet effective now                     Nutrition Problem: Increased nutrient needs Etiology: wound healing Signs/Symptoms: estimated needs Interventions: Glucerna shake, MVI   Intake/Output Summary (Last 24 hours) at 10/12/2021 1207 Last data filed at 10/12/2021 0300 Gross per 24 hour  Intake 1145.59 ml  Output 100 ml  Net 1045.59 ml    Net IO Since Admission: 1,994.36 mL [10/12/21 1207]  Wt Readings from Last 3 Encounters:  10/06/21 72.6 kg  08/19/21 73.5 kg  07/30/21 73.5 kg  Unresulted Labs (From admission, onward)    None     Data Reviewed: I have personally reviewed following labs and imaging studies CBC: Recent Labs  Lab 10/05/21 1437 10/06/21 0500 10/12/21 0343  WBC 11.1* 11.5* 14.8*  NEUTROABS 6.9 6.6 13.3*  HGB 11.2* 10.4* 10.1*  HCT 35.8* 34.5* 32.7*  MCV 86.5 88.5 87.4  PLT 532* 512* [id number]    Basic Metabolic Panel: Recent Labs  Lab 10/05/21 1437 10/06/21 0500 10/10/21 0239 10/12/21 0343  NA 138 139 136 135  K 4.4 3.8 4.1 5.1  CL 100 101 104 103  CO2 24 25 24 22   GLUCOSE 151* 120* 104* 437*  BUN 17 15 16  35*  CREATININE 0.96 0.98 0.90 0.90  CALCIUM 9.5 9.1 8.5* 8.6*  MG  --  2.2  --   --     GFR: Estimated Creatinine Clearance: 57.4 mL/min (by C-G formula based on SCr of 0.9 mg/dL). Liver Function Tests: Recent Labs  Lab 10/05/21 1437 10/06/21 0500 10/12/21 0343  AST 23 22 61*  ALT 16 15 44  ALKPHOS 71 65 207*  BILITOT 0.6 0.2* 0.4  PROT 7.2 6.7 6.1*  ALBUMIN 3.5 3.3* 2.7*    No results for input(s): "LIPASE", "AMYLASE" in the last 168 hours. No results for input(s): "AMMONIA" in the last 168 hours. Coagulation Profile: Recent Labs  Lab 10/05/21 1437  INR 1.0    BNP (last 3 results) No results for input(s): "PROBNP" in the last 8760 hours. HbA1C: No results for input(s): "HGBA1C" in the last 72 hours. CBG: Recent Labs  Lab 10/11/21 1338 10/11/21 1619 10/11/21 2106 10/12/21 0724 10/12/21 1138  GLUCAP 112* 240* 400* 368* 205*     Lipid Profile: No results for input(s): "CHOL", "HDL", "LDLCALC", "TRIG", "CHOLHDL", "LDLDIRECT" in the last 72 hours. Thyroid Function Tests: No results for input(s): "TSH", "T4TOTAL", "FREET4", "T3FREE", "THYROIDAB" in the last 72 hours. Sepsis Labs: Recent Labs  Lab 10/05/21 1437 10/06/21 0206  LATICACIDVEN 1.6 0.9     Recent Results (from the past 240 hour(s))  Culture, blood (Routine x 2)     Status: None   Collection Time: 10/05/21  2:37 PM   Specimen: BLOOD  Result Value Ref Range Status   Specimen Description BLOOD BLOOD LEFT FOREARM  Final   Special Requests   Final    BOTTLES DRAWN AEROBIC AND ANAEROBIC Blood Culture adequate volume   Culture   Final    NO GROWTH 5 DAYS Performed at [facility], [address] [place], [place] [address]    Report Status 10/10/2021 FINAL  Final  Culture, blood (Routine x 2)     Status: None   Collection Time: 10/05/21  2:47 PM   Specimen: BLOOD  Result Value Ref Range Status   Specimen Description BLOOD SITE NOT SPECIFIED  Final   Special Requests   Final    BOTTLES DRAWN AEROBIC AND ANAEROBIC Blood Culture adequate volume   Culture   Final    NO GROWTH 5 DAYS Performed at [facility], [address] [place], [place] [address]    Report Status 10/10/2021 FINAL  Final  Surgical pcr screen     Status: None   Collection Time: 10/07/21  5:40 AM   Specimen: Nasal Mucosa; Nasal Swab  Result Value Ref Range Status   MRSA, PCR NEGATIVE NEGATIVE Final   Staphylococcus aureus NEGATIVE NEGATIVE Final    Comment: (NOTE) The Xpert SA Assay (FDA approved for NASAL specimens in  patients 77 years of age and older), is one component of a comprehensive surveillance program. It is not intended to diagnose infection nor to guide or monitor treatment. Performed at [facility], [place] [address], [place], [place] [address]     Antimicrobials: Anti-infectives (From admission, onward)    Start     Dose/Rate Route  Frequency Ordered Stop   10/11/21 1800  ceFAZolin (ANCEF) IVPB 2g/100 mL premix  Status:  Discontinued        2 g 200 mL/hr over 30 Minutes Intravenous Every 8 hours 10/11/21 1448 10/11/21 1547   10/11/21 0600  ceFAZolin (ANCEF) IVPB 2g/100 mL premix        2 g 200 mL/hr over 30 Minutes Intravenous On call to O.R. 10/06/21 2011 10/11/21 1256   10/11/21 0600  ceFAZolin (ANCEF) IVPB 2g/100 mL premix  Status:  Discontinued        2 g 200 mL/hr over 30 Minutes Intravenous On call to O.R. 10/10/21 1844 10/11/21 1448   10/07/21 0600  vancomycin (VANCOREADY) IVPB 1250 mg/250 mL  Status:  Discontinued        1,250 mg 166.7 mL/hr over 90 Minutes Intravenous Every 24 hours 10/06/21 0458 10/06/21 1005   10/07/21 0300  vancomycin (VANCOCIN) IVPB 1000 mg/200 mL premix        1,000 mg 200 mL/hr over 60 Minutes Intravenous Every 24 hours 10/06/21 1005 10/12/21 0543   10/06/21 1000  cefTRIAXone (ROCEPHIN) 2 g in sodium chloride 0.9 % 100 mL IVPB        2 g 200 mL/hr over 30 Minutes Intravenous Every 24 hours 10/06/21 0849 10/12/21 0841   10/06/21 1000  metroNIDAZOLE (FLAGYL) IVPB 500 mg        500 mg 100 mL/hr over 60 Minutes Intravenous Every 12 hours 10/06/21 0849 10/13/21 0959   10/06/21 0315  piperacillin-tazobactam (ZOSYN) IVPB 3.375 g        3.375 g 100 mL/hr over 30 Minutes Intravenous  Once 10/06/21 0305 10/06/21 0610   10/06/21 0115  vancomycin (VANCOREADY) IVPB 1500 mg/300 mL        1,500 mg 150 mL/hr over 120 Minutes Intravenous  Once 10/06/21 0110 10/06/21 0454     Culture/Microbiology    Component Value Date/Time   SDES BLOOD SITE NOT SPECIFIED 10/05/2021 1447   SPECREQUEST  10/05/2021 1447    BOTTLES DRAWN AEROBIC AND ANAEROBIC Blood Culture adequate volume   CULT  10/05/2021 1447    NO GROWTH 5 DAYS Performed at [facility], [place] [address] [place], [place] [address]    REPTSTATUS 10/10/2021 FINAL 10/05/2021 1447  Radiology Studies: No results found.  LOS: 6 days   Nita Sells, MD [facility]  10/12/2021, 12:07 PM

## 2021-10-12 NOTE — Progress Notes (Signed)
Pt placed on cpap and is tolerating it well at this time. Rt will continue to monitor.

## 2021-10-12 NOTE — NC FL2 (Signed)
[place] MEDICAID FL2 LEVEL OF CARE SCREENING TOOL     IDENTIFICATION  Patient Name: Anna [name] Birthdate: [birth year] Sex: female Admission Date (Current Location): 10/05/2021  [facility] and [place] Number:  Producer, television/film/video and Address:  [facility], [address], [place], [place] [address]      Provider Number: [id number]  Attending Physician Name and Address:  Rhetta Mura, MD  Relative Name and Phone Number:  Khelani, [place] [phone]    Current Level of Care: Hospital Recommended Level of Care: Skilled Nursing Facility Prior Approval Number:    Date Approved/Denied:   PASRR Number:    Discharge Plan: SNF    Current Diagnoses: Patient Active Problem List   Diagnosis Date Noted   Gangrene of toe of left foot [facility])    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) 10/06/2021   Dyslipidemia 10/06/2021   Mood disorder (HCC) 10/06/2021   Cellulitis 08/19/2021   Hyperkalemia 08/19/2021   OSA on CPAP 08/19/2021   Pyelonephritis 06/24/2014   Diabetic neuropathy (HCC)    Essential hypertension    Type 2 diabetes mellitus (HCC)    COPD mixed type (HCC)    Acute pyelonephritis 06/23/2014    Orientation RESPIRATION BLADDER Height & Weight     Self, Time, Situation, Place  Normal Continent Weight: 160 lb (72.6 kg) Height:  5\' 2"  (157.5 cm)  BEHAVIORAL SYMPTOMS/MOOD NEUROLOGICAL BOWEL NUTRITION STATUS      Continent Diet (see discharge summary)  AMBULATORY STATUS COMMUNICATION OF NEEDS Skin   Total Care Verbally Surgical wounds                       Personal Care Assistance Level of Assistance  Bathing, Feeding, Dressing Bathing Assistance: Limited assistance Feeding assistance: Independent Dressing Assistance: Limited assistance     Functional Limitations Info  Sight, Hearing, Speech Sight Info: Adequate Hearing Info: Adequate Speech Info: Adequate    SPECIAL CARE FACTORS FREQUENCY   PT (By licensed PT), OT (By licensed OT)     PT Frequency: 5x week OT Frequency: 5x week            Contractures Contractures Info: Not present    Additional Factors Info  Code Status, Allergies, Insulin Sliding Scale Code Status Info: full Allergies Info: Bacitracin, Citalopram, Doxycycline, Morphine And Related, Neomycin, Ciprofloxacin, Ciprofloxacin-dexamethasone, Duloxetine, Latex, Levaquin (Levofloxacin In D5w), Piper, Salvia Officinalis, Soma (Carisoprodol), Lasix (Furosemide)   Insulin Sliding Scale Info: Novolog: see discharge summary       Current Medications (10/12/2021):  This is the current hospital active medication list Current Facility-Administered Medications  Medication Dose Route Frequency Provider Last Rate Last Admin   0.9 %  sodium chloride infusion   Intravenous Continuous 10/14/2021, MD   Stopped at 10/12/21 1138   0.9 %  sodium chloride infusion   Intravenous Continuous 10/14/21, MD   Stopped at 10/12/21 1139   acetaminophen (TYLENOL) tablet 325-650 mg  325-650 mg Oral Q6H PRN 10/14/21, MD       albuterol (PROVENTIL) (2.5 MG/3ML) 0.083% nebulizer solution 2.5 mg  2.5 mg Inhalation Q6H PRN Nadara Mustard, MD   2.5 mg at 10/11/21 2343   ALPRAZolam 10/13/21) tablet 0.25 mg  0.25 mg Oral TID PRN Prudy Feeler, MD   0.25 mg at 10/11/21 0521   alum & mag hydroxide-simeth (MAALOX/MYLANTA) 200-200-20 MG/5ML suspension 15-30 mL  15-30 mL Oral Q2H PRN 07-17-1974  V, MD       ascorbic acid (VITAMIN C) tablet 1,000 mg  1,000 mg Oral Daily Nadara Mustard, MD   1,000 mg at 10/12/21 0816   aspirin EC tablet 81 mg  81 mg Oral Daily Nadara Mustard, MD   81 mg at 10/12/21 0816   bisacodyl (DULCOLAX) EC tablet 5 mg  5 mg Oral Daily PRN Nadara Mustard, MD   5 mg at 10/12/21 0520   busPIRone (BUSPAR) tablet 25 mg  25 mg Oral BID Nadara Mustard, MD   25 mg at 10/12/21 0817   clopidogrel (PLAVIX) tablet 75 mg  75 mg Oral Daily Nadara Mustard, MD   75 mg at  10/12/21 0816   docusate sodium (COLACE) capsule 100 mg  100 mg Oral Daily Nadara Mustard, MD   100 mg at 10/12/21 0814   enoxaparin (LOVENOX) injection 40 mg  40 mg Subcutaneous Q24H Nadara Mustard, MD   40 mg at 10/12/21 0816   feeding supplement (GLUCERNA SHAKE) (GLUCERNA SHAKE) liquid 237 mL  237 mL Oral TID BM Nadara Mustard, MD   237 mL at 10/12/21 0937   FLUoxetine (PROZAC) capsule 80 mg  80 mg Oral Daily Nadara Mustard, MD   80 mg at 10/12/21 0936   gabapentin (NEURONTIN) capsule 300 mg  300 mg Oral TID Nadara Mustard, MD   300 mg at 10/12/21 0815   guaiFENesin (MUCINEX) 12 hr tablet 600 mg  600 mg Oral BID Nadara Mustard, MD   600 mg at 10/12/21 0816   guaiFENesin-dextromethorphan (ROBITUSSIN DM) 100-10 MG/5ML syrup 15 mL  15 mL Oral Q4H PRN Nadara Mustard, MD   15 mL at 10/12/21 0936   hydrALAZINE (APRESOLINE) injection 5 mg  5 mg Intravenous Q4H PRN Nadara Mustard, MD       HYDROmorphone (DILAUDID) injection 0.5-1 mg  0.5-1 mg Intravenous Q4H PRN Nadara Mustard, MD   1 mg at 10/12/21 1135   hydrOXYzine (ATARAX) tablet 10 mg  10 mg Oral BID Nadara Mustard, MD   10 mg at 10/12/21 0815   insulin aspart (novoLOG) injection 0-15 Units  0-15 Units Subcutaneous TID WC Nadara Mustard, MD   5 Units at 10/12/21 1255   insulin aspart (novoLOG) injection 0-5 Units  0-5 Units Subcutaneous QHS Nadara Mustard, MD   5 Units at 10/11/21 2121   insulin glargine-yfgn (SEMGLEE) injection 25 Units  25 Units Subcutaneous QHS Rhetta Mura, MD       labetalol (NORMODYNE) injection 10 mg  10 mg Intravenous Q10 min PRN Nadara Mustard, MD       lip balm (CARMEX) ointment 1 Application  1 Application Topical PRN Nadara Mustard, MD       lisinopril (ZESTRIL) tablet 5 mg  5 mg Oral Daily Nadara Mustard, MD   5 mg at 10/12/21 0815   magnesium sulfate IVPB 2 g 50 mL  2 g Intravenous Daily PRN Nadara Mustard, MD       methocarbamol (ROBAXIN) tablet 500 mg  500 mg Oral BID PRN Nadara Mustard, MD   500 mg at  10/12/21 1254   metoprolol tartrate (LOPRESSOR) injection 2-5 mg  2-5 mg Intravenous Q2H PRN Nadara Mustard, MD       multivitamin with minerals tablet 1 tablet  1 tablet Oral Daily Nadara Mustard, MD   1 tablet at 10/12/21 2992   nutrition supplement (  JUVEN) (JUVEN) powder packet 1 packet  1 packet Oral BID BM Nadara Mustard, MD   1 packet at 10/12/21 0813   ondansetron [facility]) injection 4 mg  4 mg Intravenous Q6H PRN Nadara Mustard, MD       ondansetron [facility]) tablet 4 mg  4 mg Oral Q6H PRN Nadara Mustard, MD   4 mg at 10/10/21 1026   oxyCODONE (Oxy IR/ROXICODONE) immediate release tablet 10-15 mg  10-15 mg Oral Q4H PRN Nadara Mustard, MD   15 mg at 10/12/21 1254   oxyCODONE (Oxy IR/ROXICODONE) immediate release tablet 5-10 mg  5-10 mg Oral Q4H PRN Nadara Mustard, MD       pantoprazole (PROTONIX) EC tablet 40 mg  40 mg Oral BID AC Nadara Mustard, MD   40 mg at 10/12/21 0751   phenol (CHLORASEPTIC) mouth spray 1 spray  1 spray Mouth/Throat PRN Nadara Mustard, MD       polyethylene glycol (MIRALAX / GLYCOLAX) packet 17 g  17 g Oral Daily PRN Nadara Mustard, MD       rOPINIRole (REQUIP) tablet 0.25 mg  0.25 mg Oral QHS Nadara Mustard, MD   0.25 mg at 10/11/21 2123   topiramate (TOPAMAX) tablet 25 mg  25 mg Oral Daily Nadara Mustard, MD   25 mg at 10/12/21 3419   zinc sulfate capsule 220 mg  220 mg Oral Daily Nadara Mustard, MD   220 mg at 10/12/21 6222     Discharge Medications: Please see discharge summary for a list of discharge medications.  Relevant Imaging Results:  Relevant Lab Results:   Additional Information SSN: [id number].  Pt is vaccinated for covid with multiple boosters.  Lorri Frederick, LCSW

## 2021-10-12 NOTE — Progress Notes (Signed)
Pt. I.V infiltrated NS stopped

## 2021-10-12 NOTE — Evaluation (Signed)
Physical Therapy Evaluation Patient Details Name: Anna [name] MRN: [id number] DOB: 1955-11-12 Today's Date: 10/12/2021  History of Present Illness  66 y/o female presented to ED on 10/05/21 for evaluation of L foot infection with gangrenous wound on L foot. 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 PMH: DM 2, HTN, HLD, peripheral neuropathy, CVA, COPD, CAD, GERD, obesity, PAD, and OSA on CPAP.  Clinical Impression  Patient admitted with the above. PTA, patient lives with children and grandchildren and used w/c for primary mobility. Patient currently presents with weakness, impaired balance, decreased activity tolerance, and impaired mobility. Educated patient on limb positioning, importance of limb protector, phantom limb sensations/pain, and exercises to maintain LLE strength, patient verbalized understanding. Required minA+2 for sit to stand and stand pivot transfer to recliner. Able to pivot on R LE but has difficulty problem solving scooting foot backwards. Patient deferred attempts at hopping this session. Patient will benefit from skilled PT services during acute stay to address listed deficits. Recommend SNF at discharge to maximize functional mobility and safety.        Recommendations for follow up therapy are one component of a multi-disciplinary discharge planning process, led by the attending physician.  Recommendations may be updated based on patient status, additional functional criteria and insurance authorization.  Follow Up Recommendations Skilled nursing-short term rehab (<3 hours/day) Can patient physically be transported by private vehicle: Yes    Assistance Recommended at Discharge Frequent or constant Supervision/Assistance  Patient can return home with the following  A little help with walking and/or transfers;A little help with bathing/dressing/bathroom;Assistance with cooking/housework;Direct supervision/assist for medications management;Direct supervision/assist for financial  management;Assist for transportation;Help with stairs or ramp for entrance    Equipment Recommendations Rolling Carlie Solorzano (2 wheels);Wheelchair (measurements PT);Wheelchair cushion (measurements PT)  Recommendations for Other Services       Functional Status Assessment Patient has had a recent decline in their functional status and demonstrates the ability to make significant improvements in function in a reasonable and predictable amount of time.     Precautions / Restrictions Precautions Precautions: Fall Precaution Comments: L limb protector Restrictions Weight Bearing Restrictions: Yes LLE Weight Bearing: Non weight bearing      Mobility  Bed Mobility Overal bed mobility: Needs Assistance Bed Mobility: Supine to Sit     Supine to sit: Supervision     General bed mobility comments: supervision for safety. Able to come to EOB and reposition hips towards EOB with scooting    Transfers Overall transfer level: Needs assistance Equipment used: Rolling Mery Guadalupe (2 wheels) Transfers: Sit to/from Stand, Bed to chair/wheelchair/BSC Sit to Stand: Min assist, +2 physical assistance, +2 safety/equipment Stand pivot transfers: Min assist, +2 physical assistance, +2 safety/equipment         General transfer comment: minA+2 to steady upon standing. Able to pivot on R LE to transfer to recliner on L side. MinA+2 for balance primarily with attempting to scoot foot backwards    Ambulation/Gait               General Gait Details: patient declined attempts at hopping this session, "wanting to take it slow"  Stairs            Wheelchair Mobility    Modified Rankin (Stroke Patients Only)       Balance Overall balance assessment: Needs assistance Sitting-balance support: No upper extremity supported, Feet supported Sitting balance-Leahy Scale: Good     Standing balance support: Bilateral upper extremity supported, Reliant on assistive device for balance Standing  balance-Leahy  Scale: Poor Standing balance comment: reliant on UE support                             Pertinent Vitals/Pain Pain Assessment Pain Assessment: Faces Faces Pain Scale: Hurts even more Pain Location: L residual limb Pain Descriptors / Indicators: Grimacing, Discomfort Pain Intervention(s): Monitored during session, Repositioned, Premedicated before session    Home Living Family/patient expects to be discharged to:: Private residence Living Arrangements: Children;Other (Comment) (daughter, son, and granddaughter) Available Help at Discharge: Family Type of Home: House           Home Equipment: Tub bench;Hospital bed;BSC/3in1 Additional Comments: plan to discharge to SNF    Prior Function Prior Level of Function : Needs assist             Mobility Comments: was using w/c primarily for mobility but had to return it prior to being admitted to hospital ADLs Comments: wheelchair does not fit into bathroom; sometimes hops, or heel-walks on LLE to get from doorway to tub transfer bench; has a 3in1 in her bedrooom     Hand Dominance        Extremity/Trunk Assessment   Upper Extremity Assessment Upper Extremity Assessment: Defer to OT evaluation    Lower Extremity Assessment Lower Extremity Assessment: LLE deficits/detail LLE Deficits / Details: recent L BKA. Able to perform SLR and perform adequate quad set    Cervical / Trunk Assessment Cervical / Trunk Assessment: Normal  Communication   Communication: No difficulties  Cognition Arousal/Alertness: Awake/alert Behavior During Therapy: WFL for tasks assessed/performed Overall Cognitive Status: No family/caregiver present to determine baseline cognitive functioning                                 General Comments: memory deficits noted with patient repeating same joke during session. Tangential throughout        General Comments General comments (skin integrity, edema,  etc.): Educated patient on limb positioning, importance of limb protector, phantom limb sensations, and exercises to perform to maintain L LE strength, patient verbalized understanding    Exercises Amputee Exercises Quad Sets: AROM, Left, 5 reps, Other (comment) (long sitting) Straight Leg Raises: AROM, Left, 5 reps, Other (comment) (long sitting)   Assessment/Plan    PT Assessment Patient needs continued PT services  PT Problem List Decreased strength;Decreased balance;Decreased range of motion;Decreased activity tolerance;Decreased mobility;Decreased cognition;Decreased knowledge of use of DME;Decreased safety awareness;Pain       PT Treatment Interventions DME instruction;Gait training;Functional mobility training;Therapeutic activities;Therapeutic exercise;Balance training;Patient/family education;Wheelchair mobility training    PT Goals (Current goals can be found in the Care Plan section)  Acute Rehab PT Goals Patient Stated Goal: to go to rehab then go home PT Goal Formulation: With patient Time For Goal Achievement: 10/26/21 Potential to Achieve Goals: Good    Frequency Min 3X/week     Co-evaluation PT/OT/SLP Co-Evaluation/Treatment: Yes Reason for Co-Treatment: For patient/therapist safety;To address functional/ADL transfers PT goals addressed during session: Balance;Mobility/safety with mobility         AM-PAC PT "6 Clicks" Mobility  Outcome Measure Help needed turning from your back to your side while in a flat bed without using bedrails?: A Little Help needed moving from lying on your back to sitting on the side of a flat bed without using bedrails?: A Little Help needed moving to and from a bed to a chair (including  a wheelchair)?: Total Help needed standing up from a chair using your arms (e.g., wheelchair or bedside chair)?: Total Help needed to walk in hospital room?: Total Help needed climbing 3-5 steps with a railing? : Total 6 Click Score: 10    End of  Session Equipment Utilized During Treatment: Gait belt;Other (comment) (Limb protector) Activity Tolerance: Patient tolerated treatment well Patient left: in chair;with call bell/phone within reach;with chair alarm set Nurse Communication: Mobility status PT Visit Diagnosis: Unsteadiness on feet (R26.81);Muscle weakness (generalized) (M62.81);Other abnormalities of gait and mobility (R26.89)    Time: 2694-8546 PT Time Calculation (min) (ACUTE ONLY): 32 min   Charges:   PT Evaluation $PT Eval Moderate Complexity: 1 Mod          Willliam Pettet A. Dan Humphreys PT, DPT Acute Rehabilitation Services Office [phone]   Viviann Spare 10/12/2021, 9:26 AM

## 2021-10-12 NOTE — Evaluation (Signed)
Occupational Therapy Evaluation Patient Details Name: Anna [name] MRN: [id number] DOB: 1955-09-24 Today's Date: 10/12/2021   History of Present Illness 65 y/o female presented to ED on 10/05/21 for evaluation of L foot infection with gangrenous wound on L foot. 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 PMH: DM 2, HTN, HLD, peripheral neuropathy, CVA, COPD, CAD, GERD, obesity, PAD, and OSA on CPAP.   Clinical Impression   PTA, pt lives with family, recently using wheelchair for mobility since L LE deficits and able to complete ADLs with no more than Setup assist (bathroom not w/c accessible at home). Pt presents now with deficits in cognition, balance, endurance and mild pain in L LE. Pt presents now eager to participate with overall Setup Assist for UB ADLs, Mod A for LB ADLs and Min A x 2 for transfers using RW. Pt did not feel she can safely hop with RW at this time so will further address with ADLs in next sessions. Pt interested in postacute rehab to maximize safety/independence with SNF rehab recommended.      Recommendations for follow up therapy are one component of a multi-disciplinary discharge planning process, led by the attending physician.  Recommendations may be updated based on patient status, additional functional criteria and insurance authorization.   Follow Up Recommendations  Skilled nursing-short term rehab (<3 hours/day)    Assistance Recommended at Discharge Frequent or constant Supervision/Assistance  Patient can return home with the following A little help with walking and/or transfers;A lot of help with bathing/dressing/bathroom;Assistance with cooking/housework;Assist for transportation;Help with stairs or ramp for entrance    Functional Status Assessment  Patient has had a recent decline in their functional status and demonstrates the ability to make significant improvements in function in a reasonable and predictable amount of time.  Equipment Recommendations  Wheelchair  (measurements OT);Wheelchair cushion (measurements OT)    Recommendations for Other Services       Precautions / Restrictions Precautions Precautions: Fall Precaution Comments: L limb protector Restrictions Weight Bearing Restrictions: Yes LLE Weight Bearing: Non weight bearing      Mobility Bed Mobility Overal bed mobility: Needs Assistance Bed Mobility: Supine to Sit     Supine to sit: Supervision     General bed mobility comments: supervision for safety. Able to come to EOB and reposition hips towards EOB with scooting    Transfers Overall transfer level: Needs assistance Equipment used: Rolling walker (2 wheels) Transfers: Sit to/from Stand, Bed to chair/wheelchair/BSC Sit to Stand: Min assist, +2 physical assistance, +2 safety/equipment Stand pivot transfers: Min assist, +2 physical assistance, +2 safety/equipment         General transfer comment: minA+2 to steady upon standing. Able to pivot on R LE to transfer to recliner on L side. MinA+2 for balance primarily with attempting to scoot foot backwards      Balance Overall balance assessment: Needs assistance Sitting-balance support: No upper extremity supported, Feet supported Sitting balance-Leahy Scale: Good     Standing balance support: Bilateral upper extremity supported, Reliant on assistive device for balance Standing balance-Leahy Scale: Poor Standing balance comment: reliant on UE support                           ADL either performed or assessed with clinical judgement   ADL Overall ADL's : Needs assistance/impaired Eating/Feeding: Independent   Grooming: Set up;Sitting   Upper Body Bathing: Set up;Sitting   Lower Body Bathing: Moderate assistance;Sit to/from stand;Sitting/lateral leans Lower Body Bathing  Details (indicate cue type and reason): able to bathe anterior peri region sitting and bed level after noted purewick was not in place. will need increased support in standing for  balance/safety Upper Body Dressing : Set up;Sitting   Lower Body Dressing: Moderate assistance;Sitting/lateral leans;Sit to/from stand   Toilet Transfer: Minimal assistance;+2 for physical assistance;+2 for safety/equipment;Stand-pivot;BSC/3in1;Rolling walker (2 wheels) Toilet Transfer Details (indicate cue type and reason): simulated to recliner Toileting- Clothing Manipulation and Hygiene: Moderate assistance;Sitting/lateral lean;Sit to/from stand         General ADL Comments: Pt limited by expected balance deficits s/p amputation though moving fairly well. limited assist for LB ADLs if using lateral leans with increased support in standing to prevent falls.     Vision Ability to See in Adequate Light: 0 Adequate Patient Visual Report: No change from baseline Vision Assessment?: No apparent visual deficits     Perception     Praxis      Pertinent Vitals/Pain Pain Assessment Pain Assessment: Faces Faces Pain Scale: Hurts even more Pain Location: L residual limb Pain Descriptors / Indicators: Grimacing, Discomfort Pain Intervention(s): Monitored during session     Hand Dominance Right   Extremity/Trunk Assessment Upper Extremity Assessment Upper Extremity Assessment: Overall WFL for tasks assessed   Lower Extremity Assessment Lower Extremity Assessment: Defer to PT evaluation LLE Deficits / Details: recent L BKA. Able to perform SLR and perform adequate quad set   Cervical / Trunk Assessment Cervical / Trunk Assessment: Normal   Communication Communication Communication: No difficulties   Cognition Arousal/Alertness: Awake/alert Behavior During Therapy: WFL for tasks assessed/performed Overall Cognitive Status: No family/caregiver present to determine baseline cognitive functioning                                 General Comments: memory deficits noted with patient repeating same joke during session. Tangential throughout but can be redirected      General Comments  Educated patient on limb positioning, importance of limb protector    Exercises     Shoulder Instructions      Home Living Family/patient expects to be discharged to:: Private residence Living Arrangements: Children;Other (Comment) (daughter, son, and granddaughter) Available Help at Discharge: Family Type of Home: House Home Access: Stairs to enter Secretary/administrator of Steps: 1 Entrance Stairs-Rails: Left Home Layout: One level     Bathroom Shower/Tub: Tub/shower unit         Home Equipment: Tub bench;Hospital bed;BSC/3in1   Additional Comments: plan to discharge to SNF      Prior Functioning/Environment Prior Level of Function : Needs assist             Mobility Comments: was using w/c primarily for mobility but had to return it prior to being admitted to hospital ADLs Comments: wheelchair does not fit into bathroom; sometimes hops, or heel-walks on LLE to get from doorway to tub transfer bench; has a 3in1 in her bedrooom        OT Problem List: Decreased strength;Decreased activity tolerance;Impaired balance (sitting and/or standing);Decreased safety awareness;Decreased knowledge of use of DME or AE;Decreased knowledge of precautions;Decreased cognition;Pain      OT Treatment/Interventions: Self-care/ADL training;Therapeutic exercise;Energy conservation;DME and/or AE instruction;Therapeutic activities;Balance training;Patient/family education    OT Goals(Current goals can be found in the care plan section) Acute Rehab OT Goals Patient Stated Goal: go to rehab, be able to get around and get back home to family OT Goal Formulation: With  patient Time For Goal Achievement: 10/26/21 Potential to Achieve Goals: Good  OT Frequency: Min 2X/week    Co-evaluation PT/OT/SLP Co-Evaluation/Treatment: Yes Reason for Co-Treatment: For patient/therapist safety;To address functional/ADL transfers PT goals addressed during session:  Balance;Mobility/safety with mobility OT goals addressed during session: ADL's and self-care      AM-PAC OT "6 Clicks" Daily Activity     Outcome Measure Help from another person eating meals?: None Help from another person taking care of personal grooming?: A Little Help from another person toileting, which includes using toliet, bedpan, or urinal?: A Lot Help from another person bathing (including washing, rinsing, drying)?: A Lot Help from another person to put on and taking off regular upper body clothing?: A Little Help from another person to put on and taking off regular lower body clothing?: A Lot 6 Click Score: 16   End of Session Equipment Utilized During Treatment: Gait belt;Rolling walker (2 wheels) Nurse Communication: Mobility status  Activity Tolerance: Patient tolerated treatment well Patient left: in chair;with call bell/phone within reach;with chair alarm set (no nurse call cord in room, connected to chair pad/box only)  OT Visit Diagnosis: Unsteadiness on feet (R26.81);Other abnormalities of gait and mobility (R26.89)                Time: 3532-9924 OT Time Calculation (min): 31 min Charges:  OT General Charges $OT Visit: 1 Visit OT Evaluation $OT Eval Moderate Complexity: 1 Mod  Bradd Canary, OTR/L Acute Rehab Services Office: [phone]   Lorre Munroe 10/12/2021, 9:42 AM

## 2021-10-13 ENCOUNTER — Ambulatory Visit: Payer: Medicare (Managed Care) | Admitting: Podiatry

## 2021-10-13 ENCOUNTER — Encounter (HOSPITAL_COMMUNITY): Payer: Self-pay | Admitting: Orthopedic Surgery

## 2021-10-13 DIAGNOSIS — I70262 Atherosclerosis of native arteries of extremities with gangrene, left leg: Secondary | ICD-10-CM | POA: Diagnosis not present

## 2021-10-13 LAB — BASIC METABOLIC PANEL
Anion gap: 11 (ref 5–15)
BUN: 19 mg/dL (ref 8–23)
CO2: 24 mmol/L (ref 22–32)
Calcium: 8.5 mg/dL — ABNORMAL LOW (ref 8.9–10.3)
Chloride: 98 mmol/L (ref 98–111)
Creatinine, Ser: 0.76 mg/dL (ref 0.44–1.00)
GFR, Estimated: >60 mL/min (ref >60)
Glucose, Bld: 129 mg/dL — ABNORMAL HIGH (ref 70–99)
Potassium: 4.3 mmol/L (ref 3.5–5.1)
Sodium: 133 mmol/L — ABNORMAL LOW (ref 135–145)

## 2021-10-13 LAB — GLUCOSE, CAPILLARY
Glucose-Capillary: 126 mg/dL — ABNORMAL HIGH (ref 70–99)
Glucose-Capillary: 122 mg/dL — ABNORMAL HIGH (ref 70–99)

## 2021-10-13 MED ORDER — FLUOXETINE HCL 40 MG PO CAPS: 80.0000 mg | ORAL_CAPSULE | Freq: Every day | ORAL | 0 refills | Status: DC

## 2021-10-13 MED ORDER — TOPIRAMATE 25 MG PO TABS: 25.0000 mg | ORAL_TABLET | Freq: Every day | ORAL | 0 refills | Status: DC

## 2021-10-13 MED ORDER — HYDROXYZINE HCL 10 MG PO TABS: 10.0000 mg | ORAL_TABLET | Freq: Two times a day (BID) | ORAL | 0 refills | Status: DC

## 2021-10-13 MED ORDER — GABAPENTIN 300 MG PO CAPS: 300.0000 mg | ORAL_CAPSULE | Freq: Three times a day (TID) | ORAL | 0 refills | Status: DC

## 2021-10-13 MED ORDER — OXYCODONE HCL 5 MG PO CAPS: 5.0000 mg | ORAL_CAPSULE | Freq: Four times a day (QID) | ORAL | 0 refills | Status: DC | PRN

## 2021-10-13 MED ORDER — BUSPIRONE HCL 10 MG PO TABS: 25.0000 mg | ORAL_TABLET | Freq: Two times a day (BID) | ORAL | 0 refills | Status: DC

## 2021-10-13 MED ORDER — ALPRAZOLAM 1 MG PO TABS: 1.0000 mg | ORAL_TABLET | Freq: Three times a day (TID) | ORAL | 0 refills | Status: DC | PRN

## 2021-10-13 NOTE — TOC Transition Note (Addendum)
Transition of Care [facility]) - CM/SW Discharge Note   Patient Details  Name: ALEXANDRA LIPPS MRN: [id number] Date of Birth: 1955/08/14  [facility]) CM/SW Contact:  Lorri Frederick, LCSW Phone Number: 10/13/2021, 12:00 PM   Clinical Narrative:   Pt discharging to [facility].  RN call report to [phone].  Irving Burton, PACE CSW contacted and will alert transportation that pt is ready.     Final next level of care: Skilled Nursing Facility Barriers to Discharge: Barriers Resolved   Patient Goals and CMS Choice Patient states their goals for this hospitalization and ongoing recovery are:: "back to normal"   Choice offered to / list presented to : Patient  Discharge Placement              Patient chooses bed at: [facility] Patient to be transferred to facility by: PTAR Name of family member notified: LM with son Gabriel Rung, phone number not working for [employer] back, contact information for [facility] provided.   Patient and family notified of of transfer: 10/13/21  Discharge Plan and Services In-house Referral: Clinical Social Work   Post Acute Care Choice: Skilled Nursing Facility                               Social Determinants of Health (SDOH) Interventions     Readmission Risk Interventions     No data to display

## 2021-10-13 NOTE — TOC Progression Note (Addendum)
Transition of Care [facility]) - Progression Note    Patient Details  Name: Anna [name] MRN: [id number] Date of Birth: Jan 19, 1956  Transition of Care [facility]) CM/SW Contact  Lorri Frederick, LCSW Phone Number: 10/13/2021, 10:39 AM  Clinical Narrative:   CSW LM with [facility] and [facility] regarding possible bed offers.  1035: passr received: [phone] E  1145: [facility] off today, unable to reach [facility] who is covering. CSW was able to speak with Velna Hatchet at [facility], who is in contact with [facility], and confirms through [facility] on the other line that they have accepted and are ready to receive pt.  Pt is accepted in the hub.    Expected Discharge Plan: Skilled Nursing Facility Barriers to Discharge: Continued Medical Work up, SNF Pending bed offer  Expected Discharge Plan and Services Expected Discharge Plan: Skilled Nursing Facility In-house Referral: Clinical Social Work   Post Acute Care Choice: Skilled Nursing Facility Living arrangements for the past 2 months: Single Family Home Expected Discharge Date: 10/13/21                                     Social Determinants of Health (SDOH) Interventions    Readmission Risk Interventions     No data to display

## 2021-10-13 NOTE — Discharge Summary (Signed)
Physician Discharge Summary  Anna [name] YHC:[id number] DOB: [birth year] DOA: 10/05/2021  PCP: Anna Adie, MD  Admit date: 10/05/2021 Discharge date: 10/13/2021  Time spent: 27 minutes  Recommendations for Outpatient Follow-up:  Requires Chem-12 CBC in about 1 week Recommend wound VAC as well as follow-up with orthopedics in about 1 week  Discharge Diagnoses:  MAIN problem for hospitalization   Left foot infection Critical limb ischemia Mood disorder AKI earlier in admission OSA BMI 29  Please see below for itemized issues addressed in Anna [name]- refer to other progress notes for clarity if needed  Discharge Condition: Improved  Diet recommendation: Diabetic  Filed Weights   10/06/21 0732  Weight: 72.6 kg    History of present illness:  66 y.o.f w/ history significant of DM, HTN, and HLD presented with L foot infection. She is scheduled for L BKA next week (7/18) by Dr Anna [name] but presented to the ED with significant increase in her left foot swelling tenderness redness along with fever subjective, chills.  In the ED BMP stable CBC with mild leukocytosis lactic acid 1.6 chronic anemia, thrombocytosis 532, sed rate 84.Patient was admitted for amputation.   Patient is having severe pain was added on OxyContin following which she was very drowsy.  Seen by vascular initially contemplating amputation on Monday but seen by Dr. Rolly [name] transtibial amputation 7/19    Hospital Course:  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, with surrounding cellulitis PVD: S/p L sided BKA 7/19 Patient followed by Dr. Sharol [name]- was scheduled for outpatient BKA 7/18 but presented with worsening pain.   Continues on vancomycin and Flagyl and ceftriaxone--did d/c Abx 7/20 Continue  2.5 mg oxy every 4 Cont aspirin and Plavix, Neurontin.   Will need skilled placement on discharge and will need follow-up with wound VAC at facility as well as with Dr. Sharol [name] AKI  Hyperkalemia: BUN slight up, also K 5.1--stop K replacement--give IVF today--- BUNs/creatinine improved AKI resolved and potassium better-discontinued scheduled potassium Essential hypertension: Controlled on lisinopril 5 mg daily T2DM W/ diabetic neuropathy: Controlled on SSI, CBGs improved to the 120 range after increase back to regular insulin dosing of 25 units we will go home on specialized sliding scale as well OSA cont CPAP nightly Dyslipidemia-previously on Crestor but stopped in June as LDL at goal.   Mood disorder/anxiety/RLS: Mood stable on BuSpar, Requip, Prozac, Neurontin, Atarax and Topamax--scripts written for patient additionally Hepatic steatosis monitor lfts periodically--seems stabilized COPD-not in exacerbation, cont nebs,IS. Chronic anemia-monitor hb periop. Overweight with Body mass index is 29.26 kg/m. : will benefit with outpatient weight loss   Discharge Exam: Vitals:   10/13/21 0441 10/13/21 0747  BP: 134/80 (!) 158/68  Pulse: 80 94  Resp: 18   Temp: 98.2 F (36.8 C) 100.3 F (37.9 C)  SpO2: 100% 96%    Subj on day of d/c   Awake coherent seems to be screaming out in pain but had just received pain meds-overall does seem to be a little bit more excitable than previously No chest pain No fever Intermittent periods of lucidity  General Exam on discharge  Awake coherent in some painful distress CTA B no added sound rales rhonchi Abdomen soft ROM intact Chest is clear S1-S2 no murmur Left lower extremity has wound VAC in place  Discharge Instructions   Discharge Instructions     Diet - low sodium heart healthy   Complete by: As directed    Increase activity slowly   Complete by: As directed  Leave dressing on - Keep it clean, dry, and intact until clinic visit   Complete by: As directed    Negative Pressure Wound Therapy - Incisional   Complete by: As directed       Allergies as of 10/13/2021       Reactions   Bacitracin    Other  reaction(s): Other Burns skin   Citalopram Diarrhea   Doxycycline Other (See Comments)   Other reaction(s): Other "burning" all over body   Morphine And Related Hives   Neomycin    Other reaction(s): Other (See Comments), Unknown unknown   Ciprofloxacin    Tongue swells   Ciprofloxacin-dexamethasone Other (See Comments)   unknown   Duloxetine    Other reaction(s): Mental Status Changes (intolerance) "saw pictures of gun"   Latex Itching   Levaquin [levofloxacin In D5w] Other (See Comments)   Pt does not remember reaction but states she's had issues with med   Piper    Other reaction(s): Other (See Comments) White pepper- uncontrollable sneezing    Salvia Officinalis    Other reaction(s): Other (See Comments) Sneezing   Soma [carisoprodol]    Sleepy and constipation   Lasix [furosemide] Rash        Medication List     STOP taking these medications    ibuprofen 200 MG tablet Commonly known as: ADVIL   loperamide 2 MG capsule Commonly known as: IMODIUM   meclizine 25 MG tablet Commonly known as: ANTIVERT   Narcan 4 MG/0.1ML Liqd nasal spray kit Generic drug: naloxone   ondansetron 4 MG disintegrating tablet Commonly known as: ZOFRAN-ODT   potassium chloride SA 20 MEQ tablet Commonly known as: KLOR-CON M       TAKE these medications    acetaminophen 500 MG tablet Commonly known as: TYLENOL Take 1,000 mg by mouth every 6 (six) hours as needed for moderate pain.   albuterol 108 (90 Base) MCG/ACT inhaler Commonly known as: VENTOLIN HFA Inhale 1-2 puffs into the lungs every 6 (six) hours as needed for wheezing or shortness of breath.   ALPRAZolam 1 MG tablet Commonly known as: XANAX Take 1 tablet (1 mg total) by mouth 3 (three) times daily as needed for anxiety.   aspirin EC 81 MG tablet Take 1 tablet (81 mg total) by mouth daily. Swallow whole.   bisacodyl 5 MG EC tablet Generic drug: bisacodyl Take 10 mg by mouth daily as needed for moderate  constipation.   busPIRone 10 MG tablet Commonly known as: BUSPAR Take 2.5 tablets (25 mg total) by mouth 2 (two) times daily.   clopidogrel 75 MG tablet Commonly known as: PLAVIX Take 75 mg by mouth daily.   [place] Take 1 capsule by mouth 2 (two) times daily.   diclofenac Sodium 1 % Gel Commonly known as: VOLTAREN Apply 4 g topically 4 (four) times daily as needed (pain).   docusate sodium 100 MG capsule Commonly known as: COLACE Take 100 mg by mouth 2 (two) times daily as needed for mild constipation.   empagliflozin 10 MG Tabs tablet Commonly known as: JARDIANCE Take 10 mg by mouth daily.   esomeprazole 40 MG capsule Commonly known as: NEXIUM Take 40 mg by mouth 2 (two) times daily before a meal.   estradiol 0.1 MG/GM vaginal cream Commonly known as: ESTRACE Place 1 Applicatorful vaginally as needed (weekly as needed for dryness).   ferrous sulfate 325 (65 FE) MG tablet Take 325 mg by mouth daily with breakfast.  FLUoxetine 40 MG capsule Commonly known as: PROZAC Take 2 capsules (80 mg total) by mouth daily.   fluticasone 50 MCG/ACT nasal spray Commonly known as: FLONASE Place 1 spray into both nostrils daily as needed for allergies or rhinitis.   gabapentin 300 MG capsule Commonly known as: NEURONTIN Take 1 capsule (300 mg total) by mouth 3 (three) times daily.   guaiFENesin 600 MG 12 hr tablet Commonly known as: MUCINEX Take 600 mg by mouth 2 (two) times daily.   hydrOXYzine 10 MG tablet Commonly known as: ATARAX Take 1 tablet (10 mg total) by mouth 2 (two) times daily.   insulin lispro 100 UNIT/ML KwikPen Commonly known as: HUMALOG Inject 2-9 Units into the skin 3 (three) times daily. Sliding scale: less than 150 -0 units 150-200 - 2 units 201-250 - 3 units 251-300 - 4 units 301-350 - 5 units 351-400 - 6 units 401-450 - 7 units 451-500 - 8 units 501-550- 9 units Greater than 550 notify provider.   lidocaine 4  % Place 1 patch onto the skin daily as needed (pain).   lisinopril 5 MG tablet Commonly known as: ZESTRIL Take 5 mg by mouth daily.   loratadine 10 MG tablet Commonly known as: CLARITIN Take 10 mg by mouth daily as needed for allergies.   metFORMIN 1000 MG tablet Commonly known as: GLUCOPHAGE Take 1,000 mg by mouth 2 (two) times daily with a meal.   methocarbamol 500 MG tablet Commonly known as: ROBAXIN Take 500 mg by mouth 2 (two) times daily as needed for muscle spasms.   multivitamin with minerals Tabs tablet Take 1 tablet by mouth daily.   nystatin cream Commonly known as: MYCOSTATIN Apply 1 application. topically 4 (four) times daily as needed for dry skin.   ondansetron 4 MG tablet Commonly known as: ZOFRAN Take 4 mg by mouth every 8 (eight) hours as needed for nausea or vomiting.   oxycodone 5 MG capsule Commonly known as: OXY-IR Take 1-2 capsules (5-10 mg total) by mouth every 6 (six) hours as needed for pain.   polyethylene glycol 17 g packet Commonly known as: MIRALAX / GLYCOLAX Take 17 g by mouth daily as needed for moderate constipation.   rOPINIRole 0.25 MG tablet Commonly known as: REQUIP Take 0.25 mg by mouth at bedtime.   sucralfate 1 GM/10ML suspension Commonly known as: CARAFATE Take 1 g by mouth 4 (four) times daily as needed (stomch ulcer).   topiramate 25 MG tablet Commonly known as: TOPAMAX Take 1 tablet (25 mg total) by mouth daily.   Toujeo SoloStar 300 UNIT/ML Solostar Pen Generic drug: insulin glargine (1 Unit Dial) Inject 30 Units into the skin at bedtime.   Trulicity 1.5 TS/1.7BL Sopn Generic drug: Dulaglutide Inject 3 mg into the skin once a week. Saturday               Discharge Care Instructions  (From admission, onward)           Start     Ordered   10/13/21 0000  Leave dressing on - Keep it clean, dry, and intact until clinic visit        10/13/21 0958           Allergies  Allergen Reactions    Bacitracin     Other reaction(s): Other Burns skin     Citalopram Diarrhea   Doxycycline Other (See Comments)    Other reaction(s): Other "burning" all over body     Morphine And Related Hives  Neomycin     Other reaction(s): Other (See Comments), Unknown unknown    Ciprofloxacin     Tongue swells   Ciprofloxacin-Dexamethasone Other (See Comments)    unknown    Duloxetine     Other reaction(s): Mental Status Changes (intolerance) "saw pictures of gun"   Latex Itching   Levaquin [Levofloxacin In D5w] Other (See Comments)    Pt does not remember reaction but states she's had issues with med   Piper     Other reaction(s): Other (See Comments) White pepper- uncontrollable sneezing    Salvia Officinalis     Other reaction(s): Other (See Comments) Sneezing   Soma [Carisoprodol]     Sleepy and constipation   Lasix [Furosemide] Rash    Follow-up Information     Newt Minion, MD Follow up in 1 week(s).   Specialty: Orthopedic Surgery Contact information: [place] [id number] [phone]                  The results of significant diagnostics from this hospitalization (including imaging, microbiology, ancillary and laboratory) are listed below for reference.    Significant Diagnostic Studies: DG Foot Complete Left  Result Date: 09/22/2021 Please see detailed radiograph report in office note.  DG Foot Complete Left  Result Date: 09/20/2021 CLINICAL DATA:  Foot pain and swelling EXAM: LEFT FOOT - COMPLETE 3+ VIEW COMPARISON:  09/08/2021, 08/24/2021, 08/19/2021 FINDINGS: Chronic fracture at the base of the fifth metatarsal. Patient is status post transmetatarsal amputation fifth digit with smooth cut margin. No definite erosive change. Vascular calcifications. No soft tissue emphysema. IMPRESSION: Status post transmetatarsal amputation fifth digit without definitive acute osseous abnormality. Chronic fracture deformity base of fifth metatarsal  Electronically Signed   By: Donavan Foil M.D.   On: 09/20/2021 16:00    Microbiology: Recent Results (from the past 240 hour(s))  Culture, blood (Routine x 2)     Status: None   Collection Time: 10/05/21  2:37 PM   Specimen: BLOOD  Result Value Ref Range Status   Specimen Description BLOOD BLOOD LEFT FOREARM  Final   Special Requests   Final    BOTTLES DRAWN AEROBIC AND ANAEROBIC Blood Culture adequate volume   Culture   Final    NO GROWTH 5 DAYS Performed at [facility], [address], [place], [place] [address]    Report Status 10/10/2021 FINAL  Final  Culture, blood (Routine x 2)     Status: None   Collection Time: 10/05/21  2:47 PM   Specimen: BLOOD  Result Value Ref Range Status   Specimen Description BLOOD SITE NOT SPECIFIED  Final   Special Requests   Final    BOTTLES DRAWN AEROBIC AND ANAEROBIC Blood Culture adequate volume   Culture   Final    NO GROWTH 5 DAYS Performed at [facility], [address] [place], [place] [address]    Report Status 10/10/2021 FINAL  Final  Surgical pcr screen     Status: None   Collection Time: 10/07/21  5:40 AM   Specimen: Nasal Mucosa; Nasal Swab  Result Value Ref Range Status   MRSA, PCR NEGATIVE NEGATIVE Final   Staphylococcus aureus NEGATIVE NEGATIVE Final    Comment: (NOTE) The Xpert SA Assay (FDA approved for NASAL specimens in patients 81 years of age and older), is one component of a comprehensive surveillance program. It is not intended to diagnose infection nor to guide or monitor treatment. Performed at [facility]  [facility], [place] [address], [place], [place] [address]      Labs: Basic Metabolic Panel: Recent Labs  Lab 10/10/21 0239 10/12/21 0343 10/13/21 0812  NA 136 135 133*  K 4.1 5.1 4.3  CL 104 103 98  CO2 24 22 24   GLUCOSE 104* 437* 129*  BUN 16 35* 19  CREATININE 0.90 0.90 0.76  CALCIUM 8.5* 8.6* 8.5*   Liver Function Tests: Recent Labs  Lab 10/12/21 0343  AST 61*  ALT 44  ALKPHOS  207*  BILITOT 0.4  PROT 6.1*  ALBUMIN 2.7*   No results for input(s): "LIPASE", "AMYLASE" in the last 168 hours. No results for input(s): "AMMONIA" in the last 168 hours. CBC: Recent Labs  Lab 10/12/21 0343  WBC 14.8*  NEUTROABS 13.3*  HGB 10.1*  HCT 32.7*  MCV 87.4  PLT 400   Cardiac Enzymes: No results for input(s): "CKTOTAL", "CKMB", "CKMBINDEX", "TROPONINI" in the last 168 hours. BNP: BNP (last 3 results) No results for input(s): "BNP" in the last 8760 hours.  ProBNP (last 3 results) No results for input(s): "PROBNP" in the last 8760 hours.  CBG: Recent Labs  Lab 10/12/21 0724 10/12/21 1138 10/12/21 1631 10/12/21 2101 10/13/21 0748  GLUCAP 368* 205* 123* 80 126*       Signed:  Nita Sells MD   [facility] 10/13/2021, 9:58 AM

## 2021-10-16 LAB — SURGICAL PATHOLOGY

## 2021-10-18 ENCOUNTER — Other Ambulatory Visit: Payer: Self-pay | Admitting: *Deleted

## 2021-10-18 DIAGNOSIS — I70262 Atherosclerosis of native arteries of extremities with gangrene, left leg: Secondary | ICD-10-CM

## 2021-10-20 ENCOUNTER — Ambulatory Visit (INDEPENDENT_AMBULATORY_CARE_PROVIDER_SITE_OTHER): Payer: Medicare (Managed Care) | Admitting: Family

## 2021-10-20 ENCOUNTER — Encounter: Payer: Self-pay | Admitting: Family

## 2021-10-20 DIAGNOSIS — I96 Gangrene, not elsewhere classified: Secondary | ICD-10-CM

## 2021-10-20 NOTE — Progress Notes (Signed)
   Post-Op Visit Note   Patient: Anna [name]           Date of Birth: 25-Nov-1955           MRN: [id number] Visit Date: 10/20/2021 PCP: Jethro Bastos, MD  Chief Complaint: No chief complaint on file.   HPI:  HPI The patient is a 66 year old woman seen 1 week status post below-knee amputation on the left Ortho Exam On examination of the residual limb the incision is well approximated staples there is no gaping drainage no significant edema no erythema  Visit Diagnoses: No diagnosis found.  Plan: Begin daily Dial soap cleansing.  Dry dressings.  Shrinker and limb protector around-the-clock.  Follow-up in the office in 2 weeks for staple removal.  Follow-Up Instructions: No follow-ups on file.   Imaging: No results found.  Orders:  No orders of the defined types were placed in this encounter.  No orders of the defined types were placed in this encounter.    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) 10/06/2021   Dyslipidemia 10/06/2021   Mood disorder (HCC) 10/06/2021   Cellulitis 08/19/2021   Hyperkalemia 08/19/2021   OSA on CPAP 08/19/2021   Pyelonephritis 06/24/2014   Diabetic neuropathy (HCC)    Essential hypertension    Type 2 diabetes mellitus (HCC)    COPD mixed type (HCC)    Acute pyelonephritis 06/23/2014   Past Medical History:  Diagnosis Date   Arthritis    Asthma    Back pain    Bronchitis    Bursitis    Diabetes mellitus    Diabetic neuropathy (HCC)    Diabetic retinopathy    Hyperlipemia    Hypertension    Migraine    Obesity    Sepsis due to urinary tract infection (HCC)     No family history on file.  Past Surgical History:  Procedure Laterality Date   ABDOMINAL AORTOGRAM W/LOWER EXTREMITY Left 08/22/2021   Procedure: ABDOMINAL AORTOGRAM W/LOWER EXTREMITY;  Surgeon: Nada Libman, MD;  Location: [facility]  INVASIVE CV LAB;  Service: Cardiovascular;  Laterality: Left;   AMPUTATION Left 10/11/2021   Procedure: LEFT BELOW KNEE AMPUTATION;  Surgeon: Nadara Mustard, MD;  Location: [facility] OR;  Service: Orthopedics;  Laterality: Left;   CESAREAN SECTION     x3   CHOLECYSTECTOMY     EYE SURGERY Left    METATARSAL HEAD EXCISION Left 08/24/2021   Procedure: METATARSAL HEAD EXCISION FIFTH TOE;  Surgeon: Vivi Barrack, DPM;  Location: [facility] OR;  Service: Podiatry;  Laterality: Left;   PERIPHERAL VASCULAR BALLOON ANGIOPLASTY  08/22/2021   Procedure: PERIPHERAL VASCULAR BALLOON ANGIOPLASTY;  Surgeon: Nada Libman, MD;  Location: [facility] INVASIVE CV LAB;  Service: Cardiovascular;;  Left AT   WOUND DEBRIDEMENT Left 08/24/2021   Procedure: DEBRIDEMENT WOUND OF LEFT FOOT;  Surgeon: Vivi Barrack, DPM;  Location: [facility] OR;  Service: Podiatry;  Laterality: Left;   Social History   Occupational History   Not on file  Tobacco Use   Smoking status: Never   Smokeless tobacco: Never  Substance and Sexual Activity   Alcohol use: No   Drug use: No   Sexual activity: Not on file

## 2021-10-30 ENCOUNTER — Encounter (HOSPITAL_COMMUNITY): Payer: Medicare (Managed Care)

## 2021-10-30 ENCOUNTER — Encounter: Payer: Medicare (Managed Care) | Admitting: Surgery

## 2021-11-03 ENCOUNTER — Encounter: Payer: Medicare (Managed Care) | Admitting: Family

## 2021-11-06 ENCOUNTER — Encounter: Payer: Self-pay | Admitting: Surgery

## 2021-11-06 ENCOUNTER — Telehealth: Payer: Self-pay | Admitting: Family

## 2021-11-06 ENCOUNTER — Ambulatory Visit (INDEPENDENT_AMBULATORY_CARE_PROVIDER_SITE_OTHER): Payer: Medicare (Managed Care) | Admitting: Surgery

## 2021-11-06 ENCOUNTER — Encounter (HOSPITAL_COMMUNITY): Payer: Medicare (Managed Care)

## 2021-11-06 VITALS — BP 133/73 | HR 80 | Temp 98.3°F | Resp 20 | Ht 62.0 in | Wt 160.0 lb

## 2021-11-06 DIAGNOSIS — I70262 Atherosclerosis of native arteries of extremities with gangrene, left leg: Secondary | ICD-10-CM | POA: Diagnosis not present

## 2021-11-06 NOTE — Telephone Encounter (Signed)
It looks like she is schedule on 11/08/21 with Denny Peon for a post of apt.

## 2021-11-06 NOTE — Telephone Encounter (Signed)
[facility] called in to confirm patients next appt but there was no appt set up from last visit on 08/11 is there follow up instructions patient is post op

## 2021-11-06 NOTE — Progress Notes (Signed)
Vascular and Vein Specialist of [facility]  Patient name: Anna [name] MRN: [id number] DOB: 07/27/1955 Sex: female   REASON FOR VISIT:    Follow-up  HISOTRY OF PRESENT ILLNESS:   Anna [name] is a 66 y.o. female, who was initially evaluated by vascular surgery on 08/21/2021 for a poorly healing left fifth toe amputation as well as a great toe wound on the left.  She had undergone percutaneous intervention and amputation at [facility].  She was scheduled for below-knee amputation there but wanted a second opinion.  She underwent repeat angiography on 08/22/2021.  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  She had a palpable pulse.  She was without options for revascularization when she represented with worsening pain and infection.  She ultimately underwent left below-knee amputation by [name]  The patient is a diabetic with associated neuropathy and retinopathy.  She is on a statin for hypercholesterolemia.  She is medically managed for hypertension.  She is a non-smoker.  PAST MEDICAL HISTORY:   Past Medical History:  Diagnosis Date   Arthritis    Asthma    Back pain    Bronchitis    Bursitis    Diabetes mellitus    Diabetic neuropathy (HCC)    Diabetic retinopathy    Hyperlipemia    Hypertension    Migraine    Obesity    Sepsis due to urinary tract infection (HCC)      FAMILY HISTORY:   History reviewed. No pertinent family history.  SOCIAL HISTORY:   Social History   Tobacco Use   Smoking status: Never   Smokeless tobacco: Never  Substance Use Topics   Alcohol use: No     ALLERGIES:   Allergies  Allergen Reactions   Bacitracin     Other reaction(s): Other Burns skin     Citalopram Diarrhea   Doxycycline Other (See Comments)    Other reaction(s): Other "burning" all over body     Morphine And  Related Hives   Neomycin     Other reaction(s): Other (See Comments), Unknown unknown    Ciprofloxacin     Tongue swells   Ciprofloxacin-Dexamethasone Other (See Comments)    unknown    Duloxetine     Other reaction(s): Mental Status Changes (intolerance) "saw pictures of gun"   Latex Itching   Levaquin [Levofloxacin In D5w] Other (See Comments)    Pt does not remember reaction but states she's had issues with med   Piper     Other reaction(s): Other (See Comments) White pepper- uncontrollable sneezing    Salvia Officinalis     Other reaction(s): Other (See Comments) Sneezing   Soma [Carisoprodol]     Sleepy and constipation   Lasix [Furosemide] Rash     CURRENT MEDICATIONS:   Current Outpatient Medications  Medication Sig Dispense Refill   acetaminophen (TYLENOL) 500 MG tablet Take 1,000 mg by mouth every 6 (six) hours as needed for moderate pain.     albuterol (VENTOLIN HFA) 108 (90 Base) MCG/ACT inhaler Inhale 1-2 puffs into the lungs every 6 (six) hours as needed for wheezing or shortness of breath.     ALPRAZolam (XANAX) 1 MG tablet Take 1 tablet (1 mg total) by mouth 3 (three) times daily as needed for anxiety. 30 tablet 0   aspirin EC 81 MG tablet Take 1 tablet (81 mg total) by mouth daily.  Swallow whole. 30 tablet 2   bisacodyl 5 MG EC tablet Take 10 mg by mouth daily as needed for moderate constipation.     busPIRone (BUSPAR) 10 MG tablet Take 2.5 tablets (25 mg total) by mouth 2 (two) times daily. 40 tablet 0   clopidogrel (PLAVIX) 75 MG tablet Take 75 mg by mouth daily.     diclofenac Sodium (VOLTAREN) 1 % GEL Apply 4 g topically 4 (four) times daily as needed (pain).     docusate sodium (COLACE) 100 MG capsule Take 100 mg by mouth 2 (two) times daily as needed for mild constipation.     Dulaglutide (TRULICITY) 1.5 MG/0.5ML SOPN Inject 3 mg into the skin once a week. Saturday     empagliflozin (JARDIANCE) 10 MG TABS tablet Take 10 mg by mouth daily.      esomeprazole (NEXIUM) 40 MG capsule Take 40 mg by mouth 2 (two) times daily before a meal.     estradiol (ESTRACE) 0.1 MG/GM vaginal cream Place 1 Applicatorful vaginally as needed (weekly as needed for dryness).     ferrous sulfate 325 (65 FE) MG tablet Take 325 mg by mouth daily with breakfast.     FLUoxetine (PROZAC) 40 MG capsule Take 2 capsules (80 mg total) by mouth daily. 30 capsule 0   fluticasone (FLONASE) 50 MCG/ACT nasal spray Place 1 spray into both nostrils daily as needed for allergies or rhinitis.     gabapentin (NEURONTIN) 300 MG capsule Take 1 capsule (300 mg total) by mouth 3 (three) times daily. 30 capsule 0   guaiFENesin (MUCINEX) 600 MG 12 hr tablet Take 600 mg by mouth 2 (two) times daily.     hydrOXYzine (ATARAX) 10 MG tablet Take 1 tablet (10 mg total) by mouth 2 (two) times daily. 30 tablet 0   insulin glargine, 1 Unit Dial, (TOUJEO SOLOSTAR) 300 UNIT/ML Solostar Pen Inject 30 Units into the skin at bedtime.     insulin lispro (HUMALOG) 100 UNIT/ML KwikPen Inject 2-9 Units into the skin 3 (three) times daily. Sliding scale: less than 150 -0 units 150-200 - 2 units 201-250 - 3 units 251-300 - 4 units 301-350 - 5 units 351-400 - 6 units 401-450 - 7 units 451-500 - 8 units 501-550- 9 units Greater than 550 notify provider.     Lactobacillus Rhamnosus, GG, (CULTURELLE HEALTH & WELLNESS) CAPS Take 1 capsule by mouth 2 (two) times daily.     lidocaine 4 % Place 1 patch onto the skin daily as needed (pain).     lisinopril (ZESTRIL) 5 MG tablet Take 5 mg by mouth daily.     loratadine (CLARITIN) 10 MG tablet Take 10 mg by mouth daily as needed for allergies.     metFORMIN (GLUCOPHAGE) 1000 MG tablet Take 1,000 mg by mouth 2 (two) times daily with a meal.     methocarbamol (ROBAXIN) 500 MG tablet Take 500 mg by mouth 2 (two) times daily as needed for muscle spasms.     Multiple Vitamin (MULTIVITAMIN WITH MINERALS) TABS tablet Take 1 tablet by mouth daily.     nystatin cream  (MYCOSTATIN) Apply 1 application. topically 4 (four) times daily as needed for dry skin.     ondansetron (ZOFRAN) 4 MG tablet Take 4 mg by mouth every 8 (eight) hours as needed for nausea or vomiting.     oxycodone (OXY-IR) 5 MG capsule Take 1-2 capsules (5-10 mg total) by mouth every 6 (six) hours as needed for pain. 30 capsule 0  polyethylene glycol (MIRALAX / GLYCOLAX) 17 g packet Take 17 g by mouth daily as needed for moderate constipation.     rOPINIRole (REQUIP) 0.25 MG tablet Take 0.25 mg by mouth at bedtime.     sucralfate (CARAFATE) 1 GM/10ML suspension Take 1 g by mouth 4 (four) times daily as needed (stomch ulcer).     topiramate (TOPAMAX) 25 MG tablet Take 1 tablet (25 mg total) by mouth daily. 30 tablet 0   No current facility-administered medications for this visit.    REVIEW OF SYSTEMS:   [X]  denotes positive finding, [ ]  denotes negative finding Cardiac  Comments:  Chest pain or chest pressure:    Shortness of breath upon exertion:    Short of breath when lying flat:    Irregular heart rhythm:        Vascular    Pain in calf, thigh, or hip brought on by ambulation:    Pain in feet at night that wakes you up from your sleep:     Blood clot in your veins:    Leg swelling:         Pulmonary    Oxygen at home:    Productive cough:     Wheezing:         Neurologic    Sudden weakness in arms or legs:     Sudden numbness in arms or legs:     Sudden onset of difficulty speaking or slurred speech:    Temporary loss of vision in one eye:     Problems with dizziness:         Gastrointestinal    Blood in stool:     Vomited blood:         Genitourinary    Burning when urinating:     Blood in urine:        Psychiatric    Major depression:         Hematologic    Bleeding problems:    Problems with blood clotting too easily:        Skin    Rashes or ulcers:        Constitutional    Fever or chills:      PHYSICAL EXAM:   Vitals:   11/06/21 1500  BP:  133/73  Pulse: 80  Resp: 20  Temp: 98.3 F (36.8 C)  SpO2: 97%  Weight: 160 lb (72.6 kg)  Height: 5\' 2"  (1.575 m)    GENERAL: The patient is a well-nourished female, in no acute distress. The vital signs are documented above. CARDIAC: There is a regular rate and rhythm.  VASCULAR: Nonpalpable pedal pulse on the right PULMONARY: Non-labored respirations ABDOMEN: Soft and non-tender with normal pitched bowel sounds.  MUSCULOSKELETAL: Left below-knee amputation  NEUROLOGIC: No focal weakness or paresthesias are detected. SKIN: There are no ulcers or rashes noted. PSYCHIATRIC: The patient has a normal affect.  STUDIES:   None  MEDICAL ISSUES:   PAD: The patient has undergone left below-knee amputation by Dr. .  She is scheduled to get her staples out in the near future.  I have discussed that she needs to have vascular follow-up.  This could either be at [facility] or here.  She does not have any rest pain or ulcers on the right leg.  Therefore, I have recommended that she get surveillance ABIs in 1 year.  I will also get a carotid ultrasound at that time.    , MD, FACS Vascular and Vein  Specialists of [facility] [phone] Pager [phone]

## 2021-11-08 ENCOUNTER — Encounter: Payer: Self-pay | Admitting: Family

## 2021-11-08 ENCOUNTER — Ambulatory Visit (INDEPENDENT_AMBULATORY_CARE_PROVIDER_SITE_OTHER): Payer: Medicare (Managed Care) | Admitting: Family

## 2021-11-08 DIAGNOSIS — I70262 Atherosclerosis of native arteries of extremities with gangrene, left leg: Secondary | ICD-10-CM

## 2021-11-08 NOTE — Progress Notes (Signed)
   Post-Op Visit Note   Patient: Anna [name]           Date of Birth: [birth year]           MRN: [id number] Visit Date: 11/08/2021 PCP: Jethro Bastos, MD  Chief Complaint: No chief complaint on file.   HPI:  HPI The patient is a 66 year old woman seen today status post left below-knee amputation Ortho Exam On examination of the left residual limb staples are in place this is healing well there is no gaping or drainage no erythema then consolidating well.  Visit Diagnoses: No diagnosis found.  Plan: Staples harvested today without incident.  Shrinker applied.  Given an order for her prosthesis set up. Follow-Up Instructions: No follow-ups on file.   Imaging: No results found.  Orders:  No orders of the defined types were placed in this encounter.  No orders of the defined types were placed in this encounter.    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) 10/06/2021   Dyslipidemia 10/06/2021   Mood disorder (HCC) 10/06/2021   Cellulitis 08/19/2021   Hyperkalemia 08/19/2021   OSA on CPAP 08/19/2021   Pyelonephritis 06/24/2014   Diabetic neuropathy (HCC)    Essential hypertension    Type 2 diabetes mellitus (HCC)    COPD mixed type (HCC)    Acute pyelonephritis 06/23/2014   Past Medical History:  Diagnosis Date   Arthritis    Asthma    Back pain    Bronchitis    Bursitis    Diabetes mellitus    Diabetic neuropathy (HCC)    Diabetic retinopathy    Hyperlipemia    Hypertension    Migraine    Obesity    Sepsis due to urinary tract infection (HCC)     History reviewed. No pertinent family history.  Past Surgical History:  Procedure Laterality Date   ABDOMINAL AORTOGRAM W/LOWER EXTREMITY Left 08/22/2021   Procedure: ABDOMINAL AORTOGRAM W/LOWER EXTREMITY;  Surgeon: Nada Libman, MD;  Location: [facility] IN[facility];   Service: Cardiovascular;  Laterality: Left;   AMPUTATION Left 10/11/2021   Procedure: LEFT BELOW KNEE AMPUTATION;  Surgeon: Nadara Mustard, MD;  Location: [facility] OR;  Service: Orthopedics;  Laterality: Left;   CESAREAN SECTION     x3   CHOLECYSTECTOMY     EYE SURGERY Left    METATARSAL HEAD EXCISION Left 08/24/2021   Procedure: METATARSAL HEAD EXCISION FIFTH TOE;  Surgeon: Vivi Barrack, DPM;  Location: [facility] OR;  Service: Podiatry;  Laterality: Left;   PERIPHERAL VASCULAR BALLOON ANGIOPLASTY  08/22/2021   Procedure: PERIPHERAL VASCULAR BALLOON ANGIOPLASTY;  Surgeon: Nada Libman, MD;  Location: [facility];  Service: Cardiovascular;;  Left AT   WOUND DEBRIDEMENT Left 08/24/2021   Procedure: DEBRIDEMENT WOUND OF LEFT FOOT;  Surgeon: Vivi Barrack, DPM;  Location: [facility] OR;  Service: Podiatry;  Laterality: Left;   Social History   Occupational History   Not on file  Tobacco Use   Smoking status: Never   Smokeless tobacco: Never  Substance and Sexual Activity   Alcohol use: No   Drug use: No   Sexual activity: Not on file

## 2021-12-06 ENCOUNTER — Encounter: Payer: Medicare (Managed Care) | Admitting: Family

## 2022-01-10 ENCOUNTER — Telehealth: Payer: Self-pay

## 2022-01-10 NOTE — Telephone Encounter (Signed)
Can you please call [facility] [phone]. I called and lm on vm last week to sch follow up appt for this pt. She is a BKA and missed her f/u appt last month and has not been evaluated by the prosthetic company either and would like to get her on Erin's sch next week. Can you please call to set up? Thanks!!

## 2022-01-10 NOTE — Telephone Encounter (Signed)
-----   Message from Pamella Pert, [place] sent at 01/01/2022  4:36 PM EDT ----- Regarding: RE: call [facility] Called and lm on vm foe scheduler Leda Gauze [phone] to advise that the pt is s/p a BKA in July. Was last in the office 11/08/21 was a no show to her visit on 12/06/21 with no follow up scheduled. I also advised that [employer] had not been contacted and has not evaluated the pt for a prosthetic. To call me back so that I can make a follow up appt with either Erin or Dr. Sharol Given in the next few weeks for follow up.  ----- Message ----- From: Pamella Pert, RMA Sent: 12/28/2021   4:43 PM EDT To: Pamella Pert, RMA Subject: call [facility]                         This pt was to follow up in the office last month s/p BKA and was a no show. She also has not been scheduled for casting of her prosthetic with [employer] even though she was given rx for this. Call [facility] tomorrow and see if we can make an appt for the pt in the next week or two for follow up and also for [employer].

## 2022-02-12 ENCOUNTER — Ambulatory Visit (INDEPENDENT_AMBULATORY_CARE_PROVIDER_SITE_OTHER): Payer: Medicare (Managed Care)

## 2022-02-12 ENCOUNTER — Ambulatory Visit (INDEPENDENT_AMBULATORY_CARE_PROVIDER_SITE_OTHER): Payer: Medicare (Managed Care) | Admitting: Orthopedic Surgery

## 2022-02-12 DIAGNOSIS — Z89512 Acquired absence of left leg below knee: Secondary | ICD-10-CM | POA: Diagnosis not present

## 2022-03-04 ENCOUNTER — Encounter: Payer: Self-pay | Admitting: Orthopedic Surgery

## 2022-03-04 NOTE — Progress Notes (Signed)
Office Visit Note   Patient: Anna [name]           Date of Birth: Mar 11, 1956           MRN: [id number] Visit Date: 02/12/2022              Requested by: Jethro Bastos, MD [address] [place],  [place] [address] PCP: Jethro Bastos, MD  Chief Complaint  Patient presents with   Left Leg - Follow-up    10/11/21 left BKA w/ kerecis      HPI: Patient is 4 months status post left below-knee amputation with tissue reinforcement with Kerecis tissue graft.  She is currently wearing her shrinker but she has had decreased volume in the residual limb.  Assessment & Plan: Visit Diagnoses:  1. S/P below knee amputation, left (HCC)     Plan: Patient is provided a prescription for [employer] for a prosthesis.  Follow-Up Instructions: Return in about 4 weeks (around 03/12/2022).   Ortho Exam  Patient is alert, oriented, no adenopathy, well-dressed, normal affect, normal respiratory effort. Examination patient has an abrasion over the lateral residual limb from striking her leg on her wheelchair this is 2 mm in diameter with healthy granulation tissue.  There is no ischemic changes.  Patient is a new left transtibial  amputee.  Patient's current comorbidities are not expected to impact the ability to function with the prescribed prosthesis. Patient verbally communicates a strong desire to use a prosthesis. Patient currently requires mobility aids to ambulate without a prosthesis.  Expects not to use mobility aids with a new prosthesis.  Patient is a K2 level ambulator that will use a prosthesis to walk around their home and the community over low level environmental barriers.      Imaging: No results found.    Labs: Lab Results  Component Value Date   HGBA1C 8.2 (H) 08/20/2021   ESRSEDRATE 84 (H) 10/06/2021   ESRSEDRATE 37 (H) 08/19/2021   CRP 0.8 10/06/2021   CRP <0.5 08/19/2021   REPTSTATUS 10/10/2021 FINAL 10/05/2021   CULT  10/05/2021    NO GROWTH 5  DAYS Performed at [facility], [address] [place], [place] [address]    Leanor Kail SPECIES 05/18/2011     Lab Results  Component Value Date   ALBUMIN 2.7 (L) 10/12/2021   ALBUMIN 3.3 (L) 10/06/2021   ALBUMIN 3.5 10/05/2021   PREALBUMIN 20 10/06/2021   PREALBUMIN 19.0 08/20/2021    Lab Results  Component Value Date   MG 2.2 10/06/2021   MG 1.8 08/20/2021   No results found for: "VD25OH"  Lab Results  Component Value Date   PREALBUMIN 20 10/06/2021   PREALBUMIN 19.0 08/20/2021      Latest Ref Rng & Units 10/12/2021    3:43 AM 10/06/2021    5:00 AM 10/05/2021    2:37 PM  CBC EXTENDED  WBC 4.0 - 10.5 K/uL 14.8  11.5  11.1   RBC 3.87 - 5.11 MIL/uL 3.74  3.90  4.14   Hemoglobin 12.0 - 15.0 g/dL 29.5  28.4  13.2   HCT 36.0 - 46.0 % 32.7  34.5  35.8   Platelets 150 - 400 K/uL 400  512  532   NEUT# 1.7 - 7.7 K/uL 13.3  6.6  6.9   Lymph# 0.7 - 4.0 K/uL 0.7  3.3  3.0      There is no height or weight on file to calculate BMI.  Orders:  Orders Placed This Encounter  Procedures   XR Tibia/Fibula Left   No orders of the defined types were placed in this encounter.    Procedures: No procedures performed  Clinical Data: No additional findings.  ROS:  All other systems negative, except as noted in the HPI. Review of Systems  Objective: Vital Signs: There were no vitals taken for this visit.  Specialty Comments:  No specialty comments available.  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) 10/06/2021   Dyslipidemia 10/06/2021   Mood disorder (HCC) 10/06/2021   Cellulitis 08/19/2021   Hyperkalemia 08/19/2021   OSA on CPAP 08/19/2021   Pyelonephritis 06/24/2014   Diabetic neuropathy (HCC)    Essential hypertension    Type 2 diabetes mellitus (HCC)    COPD mixed type (HCC)    Acute pyelonephritis 06/23/2014    Past Medical History:  Diagnosis Date   Arthritis    Asthma    Back pain    Bronchitis    Bursitis    Diabetes mellitus    Diabetic neuropathy (HCC)    Diabetic retinopathy    Hyperlipemia    Hypertension    Migraine    Obesity    Sepsis due to urinary tract infection (HCC)     History reviewed. No pertinent family history.  Past Surgical History:  Procedure Laterality Date   ABDOMINAL AORTOGRAM W/LOWER EXTREMITY Left 08/22/2021   Procedure: ABDOMINAL AORTOGRAM W/LOWER EXTREMITY;  Surgeon: Nada Libman, MD;  Location: [facility];  Service: Cardiovascular;  Laterality: Left;   AMPUTATION Left 10/11/2021   Procedure: LEFT BELOW KNEE AMPUTATION;  Surgeon: Nadara Mustard, MD;  Location: [facility] OR;  Service: Orthopedics;  Laterality: Left;   CESAREAN SECTION     x3   CHOLECYSTECTOMY     EYE SURGERY Left    METATARSAL HEAD EXCISION Left 08/24/2021   Procedure: METATARSAL HEAD EXCISION FIFTH TOE;  Surgeon: Vivi Barrack, DPM;  Location: [facility] OR;  Service: Podiatry;  Laterality: Left;   PERIPHERAL VASCULAR BALLOON ANGIOPLASTY  08/22/2021   Procedure: PERIPHERAL VASCULAR BALLOON ANGIOPLASTY;  Surgeon: Nada Libman, MD;  [facility];  Service: Cardiovascular;;  Left AT   WOUND DEBRIDEMENT Left 08/24/2021   Procedure: DEBRIDEMENT WOUND OF LEFT FOOT;  Surgeon: Vivi Barrack, DPM;  Location: [facility] OR;  Service: Podiatry;  Laterality: Left;   Social History   Occupational History   Not on file  Tobacco Use   Smoking status: Never   Smokeless tobacco: Never  Substance and Sexual Activity   Alcohol use: No   Drug use: No   Sexual activity: Not on file

## 2022-03-12 ENCOUNTER — Encounter: Payer: Self-pay | Admitting: Orthopedic Surgery

## 2022-03-12 ENCOUNTER — Ambulatory Visit (INDEPENDENT_AMBULATORY_CARE_PROVIDER_SITE_OTHER): Payer: Medicare (Managed Care) | Admitting: Orthopedic Surgery

## 2022-03-12 DIAGNOSIS — Z89512 Acquired absence of left leg below knee: Secondary | ICD-10-CM | POA: Diagnosis not present

## 2022-03-12 NOTE — Progress Notes (Unsigned)
Office Visit Note   Patient: HELAYNE [name]           Date of Birth: Aug 29, 1955           MRN: [id number] Visit Date: 03/12/2022              Requested by: Anna Bastos, MD [address] [place],  [place] [address] PCP: Anna Bastos, MD  Chief Complaint  Patient presents with   Left Leg - Follow-up    10/11/2021 left BKA kerecis graft       HPI: Patient is a 66 year old woman is 5 months status post left below-knee amputation.  She is currently wearing a stump shrinker.  Patient states she struck her leg multiple times on her wheelchair and does not use the leg rest to elevate her leg.  Assessment & Plan: Visit Diagnoses:  1. S/P below knee amputation, left (HCC)     Plan: Patient is provided a new prescription for a prosthesis for the left below-knee amputation.  She has had significant decreased volume in the residual leg.  Orders are written for physical therapy to begin gait training after she receives her leg and a new prescription was provided for [employer].  Follow-Up Instructions: Return in about 2 months (around 05/13/2022).   Ortho Exam  Patient is alert, oriented, no adenopathy, well-dressed, normal affect, normal respiratory effort. Examination the residual limb has decreased volume in the left leg there is no cellulitis no open wounds no signs of infection.  Patient is a new left transtibial  amputee.  Patient's current comorbidities are not expected to impact the ability to function with the prescribed prosthesis. Patient verbally communicates a strong desire to use a prosthesis. Patient currently requires mobility aids to ambulate without a prosthesis.  Expects not to use mobility aids with a new prosthesis.  Patient is a K2 level ambulator that will use a prosthesis to walk around their home and the community over low level environmental barriers.      Imaging: No results found. No images are attached to the encounter.  Labs: Lab Results   Component Value Date   HGBA1C 8.2 (H) 08/20/2021   ESRSEDRATE 84 (H) 10/06/2021   ESRSEDRATE 37 (H) 08/19/2021   CRP 0.8 10/06/2021   CRP <0.5 08/19/2021   REPTSTATUS 10/10/2021 FINAL 10/05/2021   CULT  10/05/2021    NO GROWTH 5 DAYS Performed at [facility], [address], [place], [place] [address]    Leanor Kail SPECIES 05/18/2011     Lab Results  Component Value Date   ALBUMIN 2.7 (L) 10/12/2021   ALBUMIN 3.3 (L) 10/06/2021   ALBUMIN 3.5 10/05/2021   PREALBUMIN 20 10/06/2021   PREALBUMIN 19.0 08/20/2021    Lab Results  Component Value Date   MG 2.2 10/06/2021   MG 1.8 08/20/2021   No results found for: "VD25OH"  Lab Results  Component Value Date   PREALBUMIN 20 10/06/2021   PREALBUMIN 19.0 08/20/2021      Latest Ref Rng & Units 10/12/2021    3:43 AM 10/06/2021    5:00 AM 10/05/2021    2:37 PM  CBC EXTENDED  WBC 4.0 - 10.5 K/uL 14.8  11.5  11.1   RBC 3.87 - 5.11 MIL/uL 3.74  3.90  4.14   Hemoglobin 12.0 - 15.0 g/dL 75.9  16.3  84.6   HCT 36.0 - 46.0 % 32.7  34.5  35.8   Platelets 150 - 400 K/uL 400  512  532   NEUT# 1.7 - 7.7 K/uL 13.3  6.6  6.9   Lymph# 0.7 - 4.0 K/uL 0.7  3.3  3.0      There is no height or weight on file to calculate BMI.  Orders:  No orders of the defined types were placed in this encounter.  No orders of the defined types were placed in this encounter.    Procedures: No procedures performed  Clinical Data: No additional findings.  ROS:  All other systems negative, except as noted in the HPI. Review of Systems  Objective: Vital Signs: There were no vitals taken for this visit.  Specialty Comments:  No specialty comments available.  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) 10/06/2021   Dyslipidemia 10/06/2021   Mood disorder (HCC) 10/06/2021   Cellulitis 08/19/2021    Hyperkalemia 08/19/2021   OSA on CPAP 08/19/2021   Pyelonephritis 06/24/2014   Diabetic neuropathy (HCC)    Essential hypertension    Type 2 diabetes mellitus (HCC)    COPD mixed type (HCC)    Acute pyelonephritis 06/23/2014   Past Medical History:  Diagnosis Date   Arthritis    Asthma    Back pain    Bronchitis    Bursitis    Diabetes mellitus    Diabetic neuropathy (HCC)    Diabetic retinopathy    Hyperlipemia    Hypertension    Migraine    Obesity    Sepsis due to urinary tract infection (HCC)     History reviewed. No pertinent family history.  Past Surgical History:  Procedure Laterality Date   ABDOMINAL AORTOGRAM W/LOWER EXTREMITY Left 08/22/2021   Procedure: ABDOMINAL AORTOGRAM W/LOWER EXTREMITY;  Surgeon: Anna Libman, MD;  [facility];  Service: Cardiovascular;  Laterality: Left;   AMPUTATION Left 10/11/2021   Procedure: LEFT BELOW KNEE AMPUTATION;  Surgeon: Nadara Mustard, MD;  Location: [facility] OR;  Service: Orthopedics;  Laterality: Left;   CESAREAN SECTION     x3   CHOLECYSTECTOMY     EYE SURGERY Left    METATARSAL HEAD EXCISION Left 08/24/2021   Procedure: METATARSAL HEAD EXCISION FIFTH TOE;  Surgeon: Anna [name], DPM;  Location: [facility] OR;  Service: Podiatry;  Laterality: Left;   PERIPHERAL VASCULAR BALLOON ANGIOPLASTY  08/22/2021   Procedure: PERIPHERAL VASCULAR BALLOON ANGIOPLASTY;  Surgeon: Anna Libman, MD;  [facility];  Service: Cardiovascular;;  Left AT   WOUND DEBRIDEMENT Left 08/24/2021   Procedure: DEBRIDEMENT WOUND OF LEFT FOOT;  Surgeon: Anna [name], DPM;  Location: [facility] OR;  Service: Podiatry;  Laterality: Left;   Social History   Occupational History   Not on file  Tobacco Use   Smoking status: Never   Smokeless tobacco: Never  Substance and Sexual Activity   Alcohol use: No   Drug use: No   Sexual activity: Not on file

## 2022-03-13 ENCOUNTER — Telehealth: Payer: Self-pay | Admitting: Orthopedic Surgery

## 2022-03-13 NOTE — Telephone Encounter (Signed)
April (Rehab Director and physical therapist ) called from [facility] stating pt is attending physical therapy at [facility] and will not attend out facility for therapy session. April phone number is [phone]

## 2022-03-13 NOTE — Telephone Encounter (Signed)
Noted  

## 2022-04-02 ENCOUNTER — Ambulatory Visit: Payer: Medicare (Managed Care) | Admitting: Physical Therapy

## 2022-04-14 ENCOUNTER — Emergency Department (HOSPITAL_COMMUNITY): Payer: Medicare (Managed Care)

## 2022-04-14 ENCOUNTER — Encounter (HOSPITAL_COMMUNITY): Payer: Self-pay | Admitting: Emergency Medicine

## 2022-04-14 ENCOUNTER — Emergency Department (HOSPITAL_COMMUNITY)
Admission: EM | Admit: 2022-04-14 | Discharge: 2022-04-14 | Disposition: A | Discharge status: Home / Self Care | Payer: Medicare (Managed Care) | Attending: Emergency Medicine | Admitting: Emergency Medicine

## 2022-04-14 ENCOUNTER — Other Ambulatory Visit: Payer: Self-pay

## 2022-04-14 DIAGNOSIS — Z79899 Other long term (current) drug therapy: Secondary | ICD-10-CM | POA: Insufficient documentation

## 2022-04-14 DIAGNOSIS — Z91040 Latex allergy status: Secondary | ICD-10-CM | POA: Insufficient documentation

## 2022-04-14 DIAGNOSIS — Z7984 Long term (current) use of oral hypoglycemic drugs: Secondary | ICD-10-CM | POA: Insufficient documentation

## 2022-04-14 DIAGNOSIS — I10 Essential (primary) hypertension: Secondary | ICD-10-CM | POA: Insufficient documentation

## 2022-04-14 DIAGNOSIS — U071 COVID-19: Secondary | ICD-10-CM | POA: Diagnosis not present

## 2022-04-14 DIAGNOSIS — Z7901 Long term (current) use of anticoagulants: Secondary | ICD-10-CM | POA: Diagnosis not present

## 2022-04-14 DIAGNOSIS — Z794 Long term (current) use of insulin: Secondary | ICD-10-CM | POA: Diagnosis not present

## 2022-04-14 DIAGNOSIS — E119 Type 2 diabetes mellitus without complications: Secondary | ICD-10-CM | POA: Diagnosis not present

## 2022-04-14 DIAGNOSIS — R059 Cough, unspecified: Secondary | ICD-10-CM | POA: Diagnosis present

## 2022-04-14 LAB — CBC WITH DIFFERENTIAL/PLATELET
Abs Immature Granulocytes: 0.01 10*3/uL (ref 0.00–0.07)
Basophils Absolute: 0.1 10*3/uL (ref 0.0–0.1)
Basophils Relative: 1 %
Eosinophils Absolute: 0.2 10*3/uL (ref 0.0–0.5)
Eosinophils Relative: 2 %
HCT: 39.4 % (ref 36.0–46.0)
Hemoglobin: 12.3 g/dL (ref 12.0–15.0)
Immature Granulocytes: 0 %
Lymphocytes Relative: 35 %
Lymphs Abs: 2.4 10*3/uL (ref 0.7–4.0)
MCH: 29.8 pg (ref 26.0–34.0)
MCHC: 31.2 g/dL (ref 30.0–36.0)
MCV: 95.4 fL (ref 80.0–100.0)
Monocytes Absolute: 0.9 10*3/uL (ref 0.1–1.0)
Monocytes Relative: 13 %
Neutro Abs: 3.3 10*3/uL (ref 1.7–7.7)
Neutrophils Relative %: 49 %
Platelets: 319 10*3/uL (ref 150–400)
RBC: 4.13 MIL/uL (ref 3.87–5.11)
RDW: 13.6 % (ref 11.5–15.5)
WBC: 6.8 10*3/uL (ref 4.0–10.5)
nRBC: 0 % (ref 0.0–0.2)

## 2022-04-14 LAB — BASIC METABOLIC PANEL
Anion gap: 10 (ref 5–15)
BUN: 12 mg/dL (ref 8–23)
CO2: 24 mmol/L (ref 22–32)
Calcium: 9 mg/dL (ref 8.9–10.3)
Chloride: 107 mmol/L (ref 98–111)
Creatinine, Ser: 1.07 mg/dL — ABNORMAL HIGH (ref 0.44–1.00)
GFR, Estimated: 57 mL/min — ABNORMAL LOW (ref >60)
Glucose, Bld: 195 mg/dL — ABNORMAL HIGH (ref 70–99)
Potassium: 3.6 mmol/L (ref 3.5–5.1)
Sodium: 141 mmol/L (ref 135–145)

## 2022-04-14 LAB — RESP PANEL BY RT-PCR (RSV, FLU A&B, COVID)  RVPGX2
Influenza A by PCR: NEGATIVE
Influenza B by PCR: NEGATIVE
Resp Syncytial Virus by PCR: NEGATIVE
SARS Coronavirus 2 by RT PCR: POSITIVE — AB

## 2022-04-14 MED ORDER — BENZONATATE 100 MG PO CAPS
200.0000 mg | ORAL_CAPSULE | Freq: Once | ORAL | Status: AC
  Administered 2022-04-14: 200 mg via ORAL
  Filled 2022-04-14: qty 2

## 2022-04-14 MED ORDER — BENZONATATE 100 MG PO CAPS: 100.0000 mg | ORAL_CAPSULE | Freq: Three times a day (TID) | ORAL | 0 refills | Status: DC

## 2022-04-14 MED ORDER — NIRMATRELVIR/RITONAVIR (PAXLOVID)TABLET: 3.0000 | ORAL_TABLET | Freq: Two times a day (BID) | ORAL | 0 refills | Status: AC

## 2022-04-14 MED ORDER — ALBUTEROL SULFATE HFA 108 (90 BASE) MCG/ACT IN AERS
2.0000 | INHALATION_SPRAY | Freq: Once | RESPIRATORY_TRACT | Status: AC
  Administered 2022-04-14: 2 via RESPIRATORY_TRACT
  Filled 2022-04-14: qty 6.7

## 2022-04-14 NOTE — ED Triage Notes (Signed)
Pt Bib [facility] from home. Pt reports cough, congestion, body aches that started this morning.

## 2022-04-14 NOTE — ED Notes (Signed)
Patient verbalizes understanding of discharge instructions. Opportunity for questioning and answers were provided. Armband removed by staff, pt discharged from ED. Pt taken to ED waiting room via wheel chair.  

## 2022-04-14 NOTE — Discharge Instructions (Addendum)
Take Paxlovid as prescribed.  Take Tessalon Perles as needed for cough.  You have COVID and need to isolate for 5 days and you wear a mask around others for another 5 days  See your doctor for follow-up  Return to ER if you have worse cough or fever or trouble breathing

## 2022-04-14 NOTE — ED Provider Triage Note (Signed)
Emergency Medicine Provider Triage Evaluation Note  Anna [name] , a 67 y.o. female  was evaluated in triage.  Pt complains of short of breath, coughing, fevers and bodyaches.  Started yesterday, worsened today.  History of COPD and asthma..  Review of Systems  Per HPI  Physical Exam  There were no vitals taken for this visit. Gen:   Awake, no distress   Resp:  Normal effort coarse lung sounds, wheezing. MSK:   Moves extremities without difficulty  Other:    Medical Decision Making  Medically screening exam initiated at 5:46 PM.  Appropriate orders placed.  SHAWNICE TILMON was informed that the remainder of the evaluation will be completed by another provider, this initial triage assessment does not replace that evaluation, and the importance of remaining in the ED until their evaluation is complete.     Sherrill Raring, PA-C 04/14/22 1746

## 2022-04-14 NOTE — ED Provider Notes (Signed)
[facility] Provider Note   CSN: [id number] Arrival date & time: 04/14/22  1717     History  Chief Complaint  Patient presents with   Cough    Anna [name] is a 67 y.o. female history of diabetes, hypertension here presenting with cough and chills.  [facility]  Patient states that she has been coughing since yesterday.  She felt like it was a asthma exacerbation.  Has been using albuterol with minimal relief.  She states that she noticed some of the elderly patients at the program is coughing as well.  The history is provided by the patient.       Home Medications Prior to Admission medications   Medication Sig Start Date End Date Taking? Authorizing Provider  acetaminophen (TYLENOL) 500 MG tablet Take 1,000 mg by mouth every 6 (six) hours as needed for moderate pain.    [provider]  albuterol (VENTOLIN HFA) 108 (90 Base) MCG/ACT inhaler Inhale 1-2 puffs into the lungs every 6 (six) hours as needed for wheezing or shortness of breath.    [provider]  ALPRAZolam Duanne Moron) 1 MG tablet Take 1 tablet (1 mg total) by mouth 3 (three) times daily as needed for anxiety. 10/13/21   Nita Sells, MD  bisacodyl 5 MG EC tablet Take 10 mg by mouth daily as needed for moderate constipation.    [provider]  busPIRone (BUSPAR) 10 MG tablet Take 2.5 tablets (25 mg total) by mouth 2 (two) times daily. 10/13/21   Nita Sells, MD  clopidogrel (PLAVIX) 75 MG tablet Take 75 mg by mouth daily.    [provider]  diclofenac Sodium (VOLTAREN) 1 % GEL Apply 4 g topically 4 (four) times daily as needed (pain).    [provider]  docusate sodium (COLACE) 100 MG capsule Take 100 mg by mouth 2 (two) times daily as needed for mild constipation.    [provider]  Dulaglutide (TRULICITY) 1.5 AS/3.4HD SOPN Inject 3 mg into the skin once a week. Saturday     [provider]  empagliflozin (JARDIANCE) 10 MG TABS tablet Take 10 mg by mouth daily.    [provider]  esomeprazole (NEXIUM) 40 MG capsule Take 40 mg by mouth 2 (two) times daily before a meal.    [provider]  estradiol (ESTRACE) 0.1 MG/GM vaginal cream Place 1 Applicatorful vaginally as needed (weekly as needed for dryness).    [provider]  ferrous sulfate 325 (65 FE) MG tablet Take 325 mg by mouth daily with breakfast.    [provider]  FLUoxetine (PROZAC) 40 MG capsule Take 2 capsules (80 mg total) by mouth daily. 10/13/21   Nita Sells, MD  fluticasone (FLONASE) 50 MCG/ACT nasal spray Place 1 spray into both nostrils daily as needed for allergies or rhinitis.    [provider]  gabapentin (NEURONTIN) 300 MG capsule Take 1 capsule (300 mg total) by mouth 3 (three) times daily. 10/13/21   Nita Sells, MD  guaiFENesin (MUCINEX) 600 MG 12 hr tablet Take 600 mg by mouth 2 (two) times daily.    [provider]  hydrOXYzine (ATARAX) 10 MG tablet Take 1 tablet (10 mg total) by mouth 2 (two) times daily. 10/13/21   Nita Sells, MD  insulin glargine, 1 Unit Dial, (TOUJEO SOLOSTAR) 300 UNIT/ML Solostar Pen Inject 30 Units into the skin at bedtime.    [provider]  insulin lispro (HUMALOG) 100 UNIT/ML KwikPen Inject 2-9 Units into the skin 3 (three) times daily. Sliding scale: less than 150 -0 units 150-200 - 2 units 201-250 - 3 units 251-300 - 4 units 301-350 - 5 units 351-400 - 6 units 401-450 - 7 units 451-500 - 8 units 501-550- 9 units Greater than 550 notify provider.    [provider]  Lactobacillus Rhamnosus, GG, ([place]) CAPS Take 1 capsule by mouth 2 (two) times daily. 08/05/21   [provider]  lidocaine 4 % Place 1 patch onto the skin daily as needed (pain).    [provider]  lisinopril (ZESTRIL) 5 MG tablet Take 5 mg by mouth  daily.    [provider]  loratadine (CLARITIN) 10 MG tablet Take 10 mg by mouth daily as needed for allergies.    [provider]  metFORMIN (GLUCOPHAGE) 1000 MG tablet Take 1,000 mg by mouth 2 (two) times daily with a meal.    [provider]  methocarbamol (ROBAXIN) 500 MG tablet Take 500 mg by mouth 2 (two) times daily as needed for muscle spasms.    [provider]  Multiple Vitamin (MULTIVITAMIN WITH MINERALS) TABS tablet Take 1 tablet by mouth daily.    [provider]  nystatin cream (MYCOSTATIN) Apply 1 application. topically 4 (four) times daily as needed for dry skin.    [provider]  ondansetron (ZOFRAN) 4 MG tablet Take 4 mg by mouth every 8 (eight) hours as needed for nausea or vomiting.    [provider]  oxycodone (OXY-IR) 5 MG capsule Take 1-2 capsules (5-10 mg total) by mouth every 6 (six) hours as needed for pain. 10/13/21   Nita Sells, MD  polyethylene glycol (MIRALAX / GLYCOLAX) 17 g packet Take 17 g by mouth daily as needed for moderate constipation.    [provider]  rOPINIRole (REQUIP) 0.25 MG tablet Take 0.25 mg by mouth at bedtime.    [provider]  sucralfate (CARAFATE) 1 GM/10ML suspension Take 1 g by mouth 4 (four) times daily as needed (stomch ulcer).    [provider]  topiramate (TOPAMAX) 25 MG tablet Take 1 tablet (25 mg total) by mouth daily. 10/13/21   Nita Sells, MD      Allergies    Bacitracin, Citalopram, Doxycycline, Morphine and related, Neomycin, Ciprofloxacin, Ciprofloxacin-dexamethasone, Duloxetine, Latex, Levaquin [levofloxacin in d5w], Piper, Salvia officinalis, Soma [carisoprodol], and Lasix [furosemide]    Review of Systems   Review of Systems  Respiratory:  Positive for cough.   All other systems reviewed and are negative.   Physical Exam Updated Vital Signs BP (!) 151/55   Pulse 93   Temp 98.6 F (37 C)   Resp 19   SpO2  100%  Physical Exam Vitals and nursing note reviewed.  Constitutional:      Appearance: Normal appearance.  HENT:     Head: Normocephalic.     Nose: Nose normal.     Mouth/Throat:     Mouth: Mucous membranes are moist.  Eyes:     Extraocular Movements: Extraocular movements intact.     Pupils: Pupils are equal, round, and reactive to light.  Cardiovascular:     Rate and Rhythm: Normal rate and regular rhythm.     Pulses: Normal pulses.     Heart sounds: Normal heart sounds.  Pulmonary:     Effort: Pulmonary effort is normal.     Comments: Slightly tachypneic and diminished bilaterally, minimally  wheezing  Abdominal:     General: Abdomen is flat.     Palpations: Abdomen is soft.  Musculoskeletal:        General: Normal range of motion.     Cervical back: Normal range of motion and neck supple.  Skin:    General: Skin is warm.     Capillary Refill: Capillary refill takes less than 2 seconds.  Neurological:     General: No focal deficit present.     Mental Status: She is alert and oriented to person, place, and time.  Psychiatric:        Mood and Affect: Mood normal.        Behavior: Behavior normal.     ED Results / Procedures / Treatments   Labs (all labs ordered are listed, but only abnormal results are displayed) Labs Reviewed  RESP PANEL BY RT-PCR (RSV, FLU A&B, COVID)  RVPGX2 - Abnormal; Notable for the following components:      Result Value   SARS Coronavirus 2 by RT PCR POSITIVE (*)    All other components within normal limits  BASIC METABOLIC PANEL - Abnormal; Notable for the following components:   Glucose, Bld 195 (*)    Creatinine, Ser 1.07 (*)    GFR, Estimated 57 (*)    All other components within normal limits  CBC WITH DIFFERENTIAL/PLATELET    EKG EKG Interpretation  Date/Time:  Saturday April 14 2022 18:01:52 EST Ventricular Rate:  90 PR Interval:  116 QRS Duration: 72 QT Interval:  334 QTC Calculation: 408 R Axis:   79 Text  Interpretation: Normal sinus rhythm Nonspecific T wave abnormality Abnormal ECG When compared with ECG of 06-Oct-2021 07:58, No significant change since last tracing Confirmed by Anna [name] [id number]) on 04/14/2022 7:32:33 PM  Radiology DG Chest 2 View  Result Date: 04/14/2022 CLINICAL DATA:  Chest pain, cough, congestion and body aches since this morning EXAM: CHEST - 2 VIEW COMPARISON:  08/01/2020 FINDINGS: Normal heart size, mediastinal contours, and pulmonary vascularity. Atherosclerotic calcification aorta. Lungs clear. No pulmonary infiltrate, pleural effusion, or pneumothorax. Osseous structures unremarkable. IMPRESSION: No acute abnormalities. Aortic Atherosclerosis (ICD10-I70.0). Electronically Signed   By: Ulyses Southward M.D.   On: 04/14/2022 18:44    Procedures Procedures    Medications Ordered in ED Medications  albuterol (VENTOLIN HFA) 108 (90 Base) MCG/ACT inhaler 2 puff (2 puffs Inhalation Given 04/14/22 2007)    ED Course/ Medical Decision Making/ A&P                             Medical Decision Making ZHAVIA CUNANAN is a 67 y.o. female here presenting with cough and wheezing.  Patient is COVID-positive.  Chest x-ray showed no pneumonia.  White blood cell count is normal.  Patient has no oxygen requirement.  Will discharge home with Paxlovid and Tessalon Perles and albuterol as needed   Problems Addressed: COVID-19: acute illness or injury  Amount and/or Complexity of Data Reviewed Labs: ordered. Decision-making details documented in ED Course.  Risk Prescription drug management.    Final Clinical Impression(s) / ED Diagnoses Final diagnoses:  None    Rx / DC Orders ED Discharge Orders     None         Charlynne Pander, MD 04/14/22 2118

## 2022-05-14 ENCOUNTER — Ambulatory Visit (INDEPENDENT_AMBULATORY_CARE_PROVIDER_SITE_OTHER): Payer: Medicare (Managed Care) | Admitting: Orthopedic Surgery

## 2022-05-14 DIAGNOSIS — Z89512 Acquired absence of left leg below knee: Secondary | ICD-10-CM | POA: Diagnosis not present

## 2022-05-15 ENCOUNTER — Ambulatory Visit: Payer: Medicare (Managed Care) | Admitting: Orthopedic Surgery

## 2022-05-21 ENCOUNTER — Encounter: Payer: Self-pay | Admitting: Orthopedic Surgery

## 2022-05-21 NOTE — Progress Notes (Signed)
Office Visit Note   Patient: Anna [name]           Date of Birth: 07-May-1955           MRN: [id number] Visit Date: 05/14/2022              Requested by: Janifer Adie, MD No address on file PCP: Janifer Adie, MD (Inactive)  Chief Complaint  Patient presents with   Left Leg - Follow-up    10/11/2021 left BKA       HPI: Patient is a 67 year old woman who presents status post left transtibial amputation July 2023.  Patient has been provided a prescription for [employer] for prosthetic fitting and [facility]  Assessment & Plan: Visit Diagnoses:  1. S/P below knee amputation, left ([place])     Plan: Patient has received her prosthetic leg she will continue physical therapy [facility].  Follow-Up Instructions: Return if symptoms worsen or fail to improve.   Ortho Exam  Patient is alert, oriented, no adenopathy, well-dressed, normal affect, normal respiratory effort. Examination patient has a well-healed residual limb no ulcers no cellulitis no swelling no signs of infection.  Imaging: No results found. No images are attached to the encounter.  Labs: Lab Results  Component Value Date   HGBA1C 8.2 (H) 08/20/2021   ESRSEDRATE 84 (H) 10/06/2021   ESRSEDRATE 37 (H) 08/19/2021   CRP 0.8 10/06/2021   CRP <0.5 08/19/2021   REPTSTATUS 10/10/2021 FINAL 10/05/2021   CULT  10/05/2021    NO GROWTH 5 DAYS Performed at [facility], [place] [address], [place], [place] [address]    LABORGA KLEBSIELLA SPECIES 05/18/2011     Lab Results  Component Value Date   ALBUMIN 2.7 (L) 10/12/2021   ALBUMIN 3.3 (L) 10/06/2021   ALBUMIN 3.5 10/05/2021   PREALBUMIN 20 10/06/2021   PREALBUMIN 19.0 08/20/2021    Lab Results  Component Value Date   MG 2.2 10/06/2021   MG 1.8 08/20/2021   No results found for: "VD25OH"  Lab Results  Component Value Date   PREALBUMIN 20 10/06/2021   PREALBUMIN 19.0 08/20/2021      Latest Ref Rng & Units  04/14/2022    5:42 PM 10/12/2021    3:43 AM 10/06/2021    5:00 AM  CBC EXTENDED  WBC 4.0 - 10.5 K/uL 6.8  14.8  11.5   RBC 3.87 - 5.11 MIL/uL 4.13  3.74  3.90   Hemoglobin 12.0 - 15.0 g/dL 12.3  10.1  10.4   HCT 36.0 - 46.0 % 39.4  32.7  34.5   Platelets 150 - 400 K/uL 319  400  512   NEUT# 1.7 - 7.7 K/uL 3.3  13.3  6.6   Lymph# 0.7 - 4.0 K/uL 2.4  0.7  3.3      There is no height or weight on file to calculate BMI.  Orders:  No orders of the defined types were placed in this encounter.  No orders of the defined types were placed in this encounter.    Procedures: No procedures performed  Clinical Data: No additional findings.  ROS:  All other systems negative, except as noted in the HPI. Review of Systems  Objective: Vital Signs: There were no vitals taken for this visit.  Specialty Comments:  No specialty comments available.  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) 10/06/2021   Dyslipidemia 10/06/2021   Mood disorder ([place]) 10/06/2021   Cellulitis 08/19/2021   Hyperkalemia 08/19/2021   OSA on CPAP 08/19/2021   Pyelonephritis 06/24/2014   Diabetic neuropathy ([place])    Essential hypertension    Type 2 diabetes mellitus ([place])    COPD mixed type ([place])    Acute pyelonephritis 06/23/2014   Past Medical History:  Diagnosis Date   Arthritis    Asthma    Back pain    Bronchitis    Bursitis    Diabetes mellitus    Diabetic neuropathy ([place])    Diabetic retinopathy    Hyperlipemia    Hypertension    Migraine    Obesity    Sepsis due to urinary tract infection ([place])     History reviewed. No pertinent family history.  Past Surgical History:  Procedure Laterality Date   ABDOMINAL AORTOGRAM W/LOWER EXTREMITY Left 08/22/2021   Procedure: ABDOMINAL AORTOGRAM W/LOWER EXTREMITY;  Surgeon: Serafina Mitchell, MD;  Location: [facility];  Service: Cardiovascular;  Laterality: Left;   AMPUTATION Left 10/11/2021   Procedure: LEFT BELOW KNEE AMPUTATION;  Surgeon: Newt Minion, MD;  Location: [facility];  Service: Orthopedics;  Laterality: Left;   CESAREAN SECTION     x3   CHOLECYSTECTOMY     EYE SURGERY Left    METATARSAL HEAD EXCISION Left 08/24/2021   Procedure: METATARSAL HEAD EXCISION FIFTH TOE;  Surgeon: Trula Slade, DPM;  Location: [facility];  Service: Podiatry;  Laterality: Left;   PERIPHERAL VASCULAR BALLOON ANGIOPLASTY  08/22/2021   Procedure: PERIPHERAL VASCULAR BALLOON ANGIOPLASTY;  Surgeon: Serafina Mitchell, MD;  Location: [facility];  Service: Cardiovascular;;  Left AT   WOUND DEBRIDEMENT Left 08/24/2021   Procedure: DEBRIDEMENT WOUND OF LEFT FOOT;  Surgeon: Trula Slade, DPM;  Location: [facility];  Service: Podiatry;  Laterality: Left;   Social History   Occupational History   Not on file  Tobacco Use   Smoking status: Never   Smokeless tobacco: Never  Substance and Sexual Activity   Alcohol use: No   Drug use: No   Sexual activity: Not on file

## 2022-05-22 ENCOUNTER — Encounter: Payer: Self-pay | Admitting: Internal Medicine

## 2022-05-22 ENCOUNTER — Other Ambulatory Visit: Payer: Self-pay

## 2022-05-22 DIAGNOSIS — Z13820 Encounter for screening for osteoporosis: Secondary | ICD-10-CM

## 2022-05-28 ENCOUNTER — Institutional Professional Consult (permissible substitution): Payer: Medicare (Managed Care) | Admitting: Adult Health

## 2022-05-29 ENCOUNTER — Encounter: Payer: Self-pay | Admitting: Adult Health

## 2022-05-29 ENCOUNTER — Ambulatory Visit (INDEPENDENT_AMBULATORY_CARE_PROVIDER_SITE_OTHER): Payer: Medicare (Managed Care) | Admitting: Adult Health

## 2022-05-29 VITALS — BP 114/50 | HR 83 | Temp 99.1°F | Ht 62.0 in | Wt 141.0 lb

## 2022-05-29 DIAGNOSIS — Z8673 Personal history of transient ischemic attack (TIA), and cerebral infarction without residual deficits: Secondary | ICD-10-CM

## 2022-05-29 DIAGNOSIS — G4733 Obstructive sleep apnea (adult) (pediatric): Secondary | ICD-10-CM

## 2022-05-29 NOTE — Progress Notes (Signed)
$'@Patient'j$  ID: Anna [name], female    DOB: March 17, 1956, 67 y.o.   MRN: [id number]  Chief Complaint  Patient presents with   Consult    Referring provider: No ref. 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  RV SF is normal.  Mild to moderate mitral valve regurg, moderate tricuspid valve regurg  05/29/2022 Sleep consult  Patient presents today for sleep consult to establish for sleep apnea.  Patient says she was diagnosed with sleep apnea in the past and previously was on CPAP.  She had housing instability and has lost her CPAP.  Says she was homeless for short period of time.  Is wanting to get back on CPAP.  Patient previously used [employer] as her DME company.  Previous records show patient was started on CPAP in 2019.  Last CPAP download in 2021 showed CPAP pressure at 13 cm H2O.  With AHI at 2.4 with very poor usage.  She complains of snoring, restless sleep, daytime sleepiness, witnessed apneic events and gasping for air during her sleep and nightmares. Marland Kitchen  Epworth score is 10 out of 24.  Typically gets sleepy if she sits down to watch TV, read or in the evening hours.  Typically goes to bed about 11 PM.  Takes an hour to go to sleep.  Is up multiple times throughout the night.  Gets up at 6 AM.  Weight is down 50 pounds over the last couple years.  Current weight is at 141 pounds with a BMI 25.  No symptoms suspicious for cataplexy or sleep paralysis.  Patient does have a history of his previous stroke.  Now living with grand daughter, has secure housing. Has Restless leg, on Requip At bedtime  -takes it a few nights a week.  Has depression, anxiety and PTSD, Phantom pain, neuropathy -on buspar,  neurontin, robaxin, requip, oxycodone. Does not drive. Granddaughter helps with care and transportation.  Goes to [place] 4 days a week.   Allergies  Allergen Reactions   Bacitracin     Other reaction(s): Other Burns skin     Citalopram Diarrhea   Doxycycline Other (See Comments)    Other reaction(s): Other "burning" all over body     Morphine And Related Hives   Neomycin     Other reaction(s): Other (See Comments), Unknown unknown    Ciprofloxacin     Tongue swells   Ciprofloxacin-Dexamethasone Other (See Comments)    unknown    Duloxetine     Other reaction(s): Mental Status Changes (intolerance) "saw pictures of gun"   Latex Itching   Levaquin [Levofloxacin In D5w] Other (See Comments)    Pt does not remember reaction but states she's had issues with med   Piper     Other reaction(s): Other (See Comments) White pepper- uncontrollable sneezing    Salvia Officinalis     Other reaction(s): Other (See Comments) Sneezing   Soma [Carisoprodol]     Sleepy and constipation   Lasix [Furosemide] Rash    Immunization History  Administered Date(s) Administered   Fluad Quad(high Dose 65+) 12/25/2021   Influenza,inj,Quad PF,6+ Mos 06/25/2014   Moderna Sars-Covid-2 Vaccination 08/05/2019, 09/08/2019, 03/10/2020   Pneumococcal Polysaccharide-23 12/09/2015   Respiratory Syncytial  Virus Vaccine,Recomb Aduvanted(Arexvy) 02/19/2022    Past Medical History:  Diagnosis Date   Arthritis    Asthma    Back pain    Bronchitis    Bursitis    Diabetes mellitus    Diabetic neuropathy (HCC)    Diabetic retinopathy    Hyperlipemia    Hypertension    Migraine    Obesity    Sepsis due to urinary tract infection [facility])    Past Surgical History:  Procedure Laterality Date   ABDOMINAL AORTOGRAM W/LOWER EXTREMITY Left 08/22/2021   Procedure: ABDOMINAL AORTOGRAM W/LOWER EXTREMITY;  Surgeon: Serafina Mitchell, MD;  Location: [facility];  Service: Cardiovascular;  Laterality:  Left;   AMPUTATION Left 10/11/2021   Procedure: LEFT BELOW KNEE AMPUTATION;  Surgeon: Newt Minion, MD;  Location: [place];  Service: Orthopedics;  Laterality: Left;   CESAREAN SECTION     x3   CHOLECYSTECTOMY     EYE SURGERY Left    METATARSAL HEAD EXCISION Left 08/24/2021   Procedure: METATARSAL HEAD EXCISION FIFTH TOE;  Surgeon: Trula Slade, DPM;  Location: [place];  Service: Podiatry;  Laterality: Left;   PERIPHERAL VASCULAR BALLOON ANGIOPLASTY  08/22/2021   Procedure: PERIPHERAL VASCULAR BALLOON ANGIOPLASTY;  Surgeon: Serafina Mitchell, MD;  Location: [place] [facility];  Service: Cardiovascular;;  Left AT   WOUND DEBRIDEMENT Left 08/24/2021   Procedure: DEBRIDEMENT WOUND OF LEFT FOOT;  Surgeon: Trula Slade, DPM;  Location: [place];  Service: Podiatry;  Laterality: Left;   Patient Active Problem List   Diagnosis Date Noted   Gangrene of toe of left foot ([place])    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 ([place]) 10/06/2021   Dyslipidemia 10/06/2021   Mood disorder ([place]) 10/06/2021   Cellulitis 08/19/2021   Hyperkalemia 08/19/2021   OSA (obstructive sleep apnea) 08/19/2021   Pyelonephritis 06/24/2014   Diabetic neuropathy ([place])    Essential hypertension    Type 2 diabetes mellitus ([name])    COPD mixed type ([place])    Acute pyelonephritis 06/23/2014     Tobacco History: Social History   Tobacco Use  Smoking Status Never  Smokeless Tobacco Never   Counseling given: Not Answered   Outpatient Medications Prior to Visit  Medication Sig Dispense Refill   acetaminophen (TYLENOL) 500 MG tablet Take 1,000 mg by mouth every 6 (six) hours as needed for moderate pain.     albuterol (VENTOLIN HFA) 108 (90 Base) MCG/ACT inhaler Inhale 1-2 puffs into the lungs every 6 (six) hours as needed for wheezing or shortness of breath.     busPIRone (BUSPAR) 10 MG tablet Take 2.5 tablets (25 mg total) by mouth 2 (two) times daily. 40 tablet 0   clopidogrel (PLAVIX) 75 MG  tablet Take 75 mg by mouth daily.     diclofenac Sodium (VOLTAREN) 1 % GEL Apply 4 g topically 4 (four) times daily as needed (pain).     docusate sodium (COLACE) 100 MG capsule Take 100 mg by mouth 2 (two) times daily as needed for mild constipation.     Dulaglutide (TRULICITY) 1.5 0000000 SOPN Inject 3 mg into the skin once a week. Saturday     estradiol (ESTRACE) 0.1 MG/GM vaginal cream Place 1 Applicatorful vaginally as needed (weekly as needed for dryness).     famotidine (PEPCID) 20 MG tablet Take 20 mg by mouth 2 (two) times daily.     FLUoxetine (PROZAC) 40 MG capsule Take 2 capsules (80 mg total)  by mouth daily. (Patient taking differently: Take 40 mg by mouth daily. 1 tablet) 30 capsule 0   fluticasone (FLONASE) 50 MCG/ACT nasal spray Place 1 spray into both nostrils daily as needed for allergies or rhinitis.     gabapentin (NEURONTIN) 300 MG capsule Take 1 capsule (300 mg total) by mouth 3 (three) times daily. (Patient taking differently: Take 300 mg by mouth 2 (two) times daily.) 30 capsule 0   guaiFENesin (MUCINEX) 600 MG 12 hr tablet Take 600 mg by mouth 2 (two) times daily as needed.     hydrOXYzine (ATARAX) 10 MG tablet Take 1 tablet (10 mg total) by mouth 2 (two) times daily. 30 tablet 0   insulin glargine, 1 Unit Dial, (TOUJEO SOLOSTAR) 300 UNIT/ML Solostar Pen Inject 30 Units into the skin at bedtime.     insulin lispro (HUMALOG) 100 UNIT/ML KwikPen Inject 2-9 Units into the skin 3 (three) times daily. Sliding scale: less than 150 -0 units 150-200 - 2 units 201-250 - 3 units 251-300 - 4 units 301-350 - 5 units 351-400 - 6 units 401-450 - 7 units 451-500 - 8 units 501-550- 9 units Greater than 550 notify provider.     Lactobacillus Rhamnosus, GG, (CULTURELLE HEALTH & WELLNESS) CAPS Take 1 capsule by mouth 2 (two) times daily as needed.     lidocaine 4 % Place 1 patch onto the skin daily as needed (pain).     lisinopril (ZESTRIL) 5 MG tablet Take 5 mg by mouth daily.      loratadine (CLARITIN) 10 MG tablet Take 10 mg by mouth daily as needed for allergies.     metFORMIN (GLUCOPHAGE) 1000 MG tablet Take 1,000 mg by mouth 2 (two) times daily with a meal.     methocarbamol (ROBAXIN) 500 MG tablet Take 500 mg by mouth 2 (two) times daily as needed for muscle spasms.     Multiple Vitamin (MULTIVITAMIN WITH MINERALS) TABS tablet Take 1 tablet by mouth daily.     nystatin cream (MYCOSTATIN) Apply 1 application. topically 4 (four) times daily as needed for dry skin.     ondansetron (ZOFRAN) 4 MG tablet Take 4 mg by mouth every 8 (eight) hours as needed for nausea or vomiting.     polyethylene glycol (MIRALAX / GLYCOLAX) 17 g packet Take 17 g by mouth daily as needed for moderate constipation.     rOPINIRole (REQUIP) 0.25 MG tablet Take 0.25 mg by mouth at bedtime.     topiramate (TOPAMAX) 25 MG tablet Take 1 tablet (25 mg total) by mouth daily. 30 tablet 0   Baclofen 5 MG TABS Take 1 tablet by mouth as needed. (Patient not taking: Reported on 05/29/2022)     benzonatate (TESSALON) 100 MG capsule Take 1 capsule (100 mg total) by mouth every 8 (eight) hours. (Patient not taking: Reported on 05/29/2022) 21 capsule 0   bisacodyl 5 MG EC tablet Take 10 mg by mouth daily as needed for moderate constipation. (Patient not taking: Reported on 05/29/2022)     esomeprazole (NEXIUM) 40 MG capsule Take 40 mg by mouth 2 (two) times daily before a meal. (Patient not taking: Reported on 05/29/2022)     oxycodone (OXY-IR) 5 MG capsule Take 1-2 capsules (5-10 mg total) by mouth every 6 (six) hours as needed for pain. (Patient not taking: Reported on 05/29/2022) 30 capsule 0   sucralfate (CARAFATE) 1 GM/10ML suspension Take 1 g by mouth 4 (four) times daily as needed (stomch ulcer). (Patient not taking: Reported  on 05/29/2022)     ALPRAZolam (XANAX) 1 MG tablet Take 1 tablet (1 mg total) by mouth 3 (three) times daily as needed for anxiety. (Patient not taking: Reported on 05/29/2022) 30 tablet 0    empagliflozin (JARDIANCE) 10 MG TABS tablet Take 10 mg by mouth daily. (Patient not taking: Reported on 05/29/2022)     ferrous sulfate 325 (65 FE) MG tablet Take 325 mg by mouth daily with breakfast. (Patient not taking: Reported on 05/29/2022)     No facility-administered medications prior to visit.     Review of Systems:   Constitutional:   No  weight loss, night sweats,  Fevers, chills,  +fatigue, or  lassitude.  HEENT:   No headaches,  Difficulty swallowing,  Tooth/dental problems, or  Sore throat,                No sneezing, itching, ear ache, nasal congestion, post nasal drip,   CV:  No chest pain,  Orthopnea, PND, swelling in lower extremities, anasarca, dizziness, palpitations, syncope.   GI  No heartburn, indigestion, abdominal pain, nausea, vomiting, diarrhea, change in bowel habits, loss of appetite, bloody stools.   Resp: No shortness of breath with exertion or at rest.  No excess mucus, no productive cough,  No non-productive cough,  No coughing up of blood.  No change in color of mucus.  No wheezing.  No chest wall deformity  Skin: no rash or lesions.  GU: no dysuria, change in color of urine, no urgency or frequency.  No flank pain, no hematuria   MS: Phantom pain    Physical Exam  BP (!) 114/50 (BP Location: Right Arm, Patient Position: Sitting, Cuff Size: Normal)   Pulse 83   Temp 99.1 F (37.3 C) (Oral)   Ht '5\' 2"'$  (1.575 m)   Wt 141 lb (64 kg)   SpO2 96%   BMI 25.79 kg/m   GEN: A/Ox3; pleasant , NAD, well nourished , WC     HEENT:  [place]/AT,   NOSE-clear, THROAT-clear, no lesions, no postnasal drip or exudate noted. Class 2 MP   NECK:  Supple w/ fair ROM; no JVD; normal carotid impulses w/o bruits; no thyromegaly or nodules palpated; no lymphadenopathy.    RESP  Clear  P & A; w/o, wheezes/ rales/ or rhonchi. no accessory muscle use, no dullness to percussion  CARD:  RRR, no m/r/g, no peripheral edema, pulses intact, no cyanosis or clubbing.  GI:   Soft &  nt; nml bowel sounds; no organomegaly or masses detected.   Musco: Warm bil, L BKA  Neuro: alert, no focal deficits noted.    Skin: Warm, no lesions or rashes    Lab Results:  CBC    BNP No results found for: "BNP"  ProBNP No results found for: "PROBNP"  Imaging: No results found.        No data to display          No results found for: "NITRICOXIDE"      Assessment & Plan:   OSA (obstructive sleep apnea) History of sleep apnea previously on CPAP with intolerance/noncompliance with housing and stability, ongoing snoring, daytime sleepiness, restless sleep, previous stroke, gasping for air during sleep, witnessed apneic events, nightmares all suspicious for ongoing sleep apnea.  Patient will need an in lab sleep study with split-night - discussed how weight can impact sleep and risk for sleep disordered breathing - discussed options to assist with weight loss: combination of diet modification, cardiovascular and strength  training exercises   - had an extensive discussion regarding the adverse health consequences related to untreated sleep disordered breathing - specifically discussed the risks for hypertension, coronary artery disease, cardiac dysrhythmias, cerebrovascular disease, and diabetes - lifestyle modification discussed   - discussed how sleep disruption can increase risk of accidents, particularly when driving - safe driving practices were discussed   Plan  Patient Instructions  Set up for split-night sleep study Healthy sleep regimen Follow-up in 6 weeks to discuss sleep study results and treatment plan      Rexene Edison, NP 05/29/2022

## 2022-05-29 NOTE — Addendum Note (Signed)
Addended by: Vanessa Barbara on: 05/29/2022 10:41 AM   Modules accepted: Orders

## 2022-05-29 NOTE — Patient Instructions (Signed)
Set up for split-night sleep study Healthy sleep regimen Follow-up in 6 weeks to discuss sleep study results and treatment plan

## 2022-05-29 NOTE — Assessment & Plan Note (Signed)
History of sleep apnea previously on CPAP with intolerance/noncompliance with housing and stability, ongoing snoring, daytime sleepiness, restless sleep, previous stroke, gasping for air during sleep, witnessed apneic events, nightmares all suspicious for ongoing sleep apnea.  Patient will need an in lab sleep study with split-night - discussed how weight can impact sleep and risk for sleep disordered breathing - discussed options to assist with weight loss: combination of diet modification, cardiovascular and strength training exercises   - had an extensive discussion regarding the adverse health consequences related to untreated sleep disordered breathing - specifically discussed the risks for hypertension, coronary artery disease, cardiac dysrhythmias, cerebrovascular disease, and diabetes - lifestyle modification discussed   - discussed how sleep disruption can increase risk of accidents, particularly when driving - safe driving practices were discussed   Plan  Patient Instructions  Set up for split-night sleep study Healthy sleep regimen Follow-up in 6 weeks to discuss sleep study results and treatment plan

## 2022-05-29 NOTE — Progress Notes (Signed)
Reviewed and agree with assessment/plan.   Chesley Mires, MD [facility] Pulmonary/Critical Care 05/29/2022, 11:27 AM Pager:  [phone]

## 2022-07-10 ENCOUNTER — Telehealth: Payer: Self-pay | Admitting: *Deleted

## 2022-07-10 ENCOUNTER — Encounter: Payer: Medicare (Managed Care) | Admitting: Adult Health

## 2022-07-10 NOTE — Telephone Encounter (Signed)
Patient has not had her split night sleep study done, PCCs state that they are 10 weeks out getting PA.  Patient came in to office for f/u, discussed doing a HST as the split night sleep study is 10 weeks out getting PA.  Order changed, patient to call after test is completed and schedule a 2 week out f/u.  Nothing further needed.

## 2022-07-10 NOTE — Addendum Note (Signed)
Addended by: Delrae Rend on: 07/10/2022 08:56 AM   Modules accepted: Orders

## 2022-07-24 ENCOUNTER — Ambulatory Visit (INDEPENDENT_AMBULATORY_CARE_PROVIDER_SITE_OTHER): Payer: Medicare (Managed Care) | Admitting: Podiatry

## 2022-07-24 ENCOUNTER — Other Ambulatory Visit: Payer: Medicare (Managed Care)

## 2022-07-24 ENCOUNTER — Other Ambulatory Visit: Payer: Self-pay

## 2022-07-24 ENCOUNTER — Encounter: Payer: Self-pay | Admitting: Podiatry

## 2022-07-24 ENCOUNTER — Ambulatory Visit: Payer: Medicare (Managed Care)

## 2022-07-24 DIAGNOSIS — Z794 Long term (current) use of insulin: Secondary | ICD-10-CM

## 2022-07-24 DIAGNOSIS — B351 Tinea unguium: Secondary | ICD-10-CM | POA: Diagnosis not present

## 2022-07-24 DIAGNOSIS — M2041 Other hammer toe(s) (acquired), right foot: Secondary | ICD-10-CM

## 2022-07-24 DIAGNOSIS — I739 Peripheral vascular disease, unspecified: Secondary | ICD-10-CM | POA: Diagnosis not present

## 2022-07-24 DIAGNOSIS — E119 Type 2 diabetes mellitus without complications: Secondary | ICD-10-CM

## 2022-07-24 DIAGNOSIS — E1152 Type 2 diabetes mellitus with diabetic peripheral angiopathy with gangrene: Secondary | ICD-10-CM | POA: Diagnosis not present

## 2022-07-24 DIAGNOSIS — Z1231 Encounter for screening mammogram for malignant neoplasm of breast: Secondary | ICD-10-CM

## 2022-07-24 NOTE — Progress Notes (Incomplete)
Patient presents to the office today for diabetic shoe and insole measuring.  Patient was measured with brannock device to determine size and width for 1 pair of extra depth shoes and foam casted for 3 pair of insoles.   ABN signed.   Documentation of medical necessity will be sent to patient's treating diabetic doctor to verify and sign.   Patient's diabetic provider: Nelly Laurence, DO  Shoes and insoles will be ordered at that time and patient will be notified for an appointment for fitting when they arrive.   Brannock measurement: 8  Patient shoe selection-   1st   Shoe choice:   [id number]  2nd  Shoe choice:   [id number]  Shoe size ordered: 8 W

## 2022-07-24 NOTE — Progress Notes (Signed)
  Subjective:  Patient ID: Anna [name], female    DOB: 06-09-1955,  MRN: [id number]  Chief Complaint  Patient presents with   Diabetes    type 2 diabetes mellitus with diabetic neuropathy  Acquired absence of left leg below knee  eval for diabetic shoes seeing christan at 31a      67 y.o. female presents with the above complaint. History confirmed with patient.  Since her last visit our office if she elected for amputation of the left leg below the knee she is ambulating in a prosthetic now and therapy has gone well.  Her right foot had some blistering recently but this healed uneventfully.  She is here for diabetic foot evaluation because she does not want to risk losing this right foot.  The nails on his right foot are thickened elongated causing pain and discomfort and are quite difficult for her to trim on her own which she does not want to risk with her diabetes and PAD as well.  Objective:  Physical Exam: warm, good capillary refill, no trophic changes or ulcerative lesions, and weakly palpable DP and PT pulse on the right side, foot is warm and well-perfused overall, nails thickened elongated yellow discolored with subungual debris, she has lesser hammertoe deformities that are reducible  Assessment:   1. PAD (peripheral artery disease) (HCC)   2. Hammertoe of right foot   3. Type 2 diabetes mellitus with diabetic peripheral angiopathy and gangrene, with long-term current use of insulin (HCC)   4. Onychomycosis   5. Encounter for diabetic foot exam [facility])      Plan:  Patient was evaluated and treated and all questions answered.  Patient educated on diabetes. Discussed proper diabetic foot care and discussed risks and complications of disease. Educated patient in depth on reasons to return to the office immediately should he/she discover anything concerning or new on the feet. All questions answered. Discussed proper shoes as well.  She was fitted for extra-depth diabetic  shoes with a multidensity insole today.  Discussed the etiology and treatment options for the condition in detail with the patient.  Recommended debridement of the nails today. Sharp and mechanical debridement performed of all painful and mycotic nails today. Nails debrided in length and thickness using a nail nipper to level of comfort. Discussed treatment options including appropriate shoe gear. Follow up as needed for painful nails.  This is medically necessary due to her significant diabetes and PAD    Return in about 3 months (around 10/23/2022) for at risk diabetic foot care.

## 2022-09-05 ENCOUNTER — Ambulatory Visit: Payer: Medicare (Managed Care)

## 2022-09-30 ENCOUNTER — Emergency Department (HOSPITAL_COMMUNITY): Payer: Medicare (Managed Care)

## 2022-09-30 ENCOUNTER — Encounter (HOSPITAL_COMMUNITY): Admission: RE | Payer: Self-pay | Source: Skilled Nursing Facility | Attending: Internal Medicine

## 2022-09-30 ENCOUNTER — Inpatient Hospital Stay (HOSPITAL_COMMUNITY)
Admission: RE | Admit: 2022-09-30 | Discharge: 2022-10-04 | DRG: 853 | Disposition: A | Discharge status: Other Acute Inpatient Hospital | Payer: Medicare (Managed Care) | Source: Skilled Nursing Facility | Attending: Internal Medicine | Admitting: Internal Medicine

## 2022-09-30 ENCOUNTER — Inpatient Hospital Stay (HOSPITAL_COMMUNITY): Payer: Medicare (Managed Care) | Admitting: Anesthesiology

## 2022-09-30 ENCOUNTER — Encounter (HOSPITAL_COMMUNITY): Payer: Self-pay

## 2022-09-30 ENCOUNTER — Inpatient Hospital Stay (HOSPITAL_COMMUNITY): Payer: Medicare (Managed Care)

## 2022-09-30 ENCOUNTER — Other Ambulatory Visit: Payer: Self-pay

## 2022-09-30 DIAGNOSIS — Z881 Allergy status to other antibiotic agents status: Secondary | ICD-10-CM | POA: Diagnosis not present

## 2022-09-30 DIAGNOSIS — A419 Sepsis, unspecified organism: Secondary | ICD-10-CM

## 2022-09-30 DIAGNOSIS — N390 Urinary tract infection, site not specified: Secondary | ICD-10-CM

## 2022-09-30 DIAGNOSIS — N179 Acute kidney failure, unspecified: Secondary | ICD-10-CM | POA: Diagnosis present

## 2022-09-30 DIAGNOSIS — Z794 Long term (current) use of insulin: Secondary | ICD-10-CM

## 2022-09-30 DIAGNOSIS — Z6823 Body mass index (BMI) 23.0-23.9, adult: Secondary | ICD-10-CM | POA: Diagnosis not present

## 2022-09-30 DIAGNOSIS — Z87440 Personal history of urinary (tract) infections: Secondary | ICD-10-CM

## 2022-09-30 DIAGNOSIS — Z79899 Other long term (current) drug therapy: Secondary | ICD-10-CM

## 2022-09-30 DIAGNOSIS — R6521 Severe sepsis with septic shock: Secondary | ICD-10-CM | POA: Diagnosis present

## 2022-09-30 DIAGNOSIS — Z885 Allergy status to narcotic agent status: Secondary | ICD-10-CM | POA: Diagnosis not present

## 2022-09-30 DIAGNOSIS — Z7984 Long term (current) use of oral hypoglycemic drugs: Secondary | ICD-10-CM

## 2022-09-30 DIAGNOSIS — I10 Essential (primary) hypertension: Secondary | ICD-10-CM | POA: Diagnosis present

## 2022-09-30 DIAGNOSIS — E871 Hypo-osmolality and hyponatremia: Secondary | ICD-10-CM | POA: Diagnosis present

## 2022-09-30 DIAGNOSIS — Z888 Allergy status to other drugs, medicaments and biological substances status: Secondary | ICD-10-CM | POA: Diagnosis not present

## 2022-09-30 DIAGNOSIS — K219 Gastro-esophageal reflux disease without esophagitis: Secondary | ICD-10-CM | POA: Diagnosis present

## 2022-09-30 DIAGNOSIS — E785 Hyperlipidemia, unspecified: Secondary | ICD-10-CM | POA: Diagnosis present

## 2022-09-30 DIAGNOSIS — E1151 Type 2 diabetes mellitus with diabetic peripheral angiopathy without gangrene: Secondary | ICD-10-CM | POA: Diagnosis present

## 2022-09-30 DIAGNOSIS — F419 Anxiety disorder, unspecified: Secondary | ICD-10-CM | POA: Diagnosis present

## 2022-09-30 DIAGNOSIS — B9620 Unspecified Escherichia coli [E. coli] as the cause of diseases classified elsewhere: Secondary | ICD-10-CM | POA: Diagnosis present

## 2022-09-30 DIAGNOSIS — G4733 Obstructive sleep apnea (adult) (pediatric): Secondary | ICD-10-CM | POA: Diagnosis present

## 2022-09-30 DIAGNOSIS — Z7902 Long term (current) use of antithrombotics/antiplatelets: Secondary | ICD-10-CM

## 2022-09-30 DIAGNOSIS — Z89512 Acquired absence of left leg below knee: Secondary | ICD-10-CM | POA: Diagnosis not present

## 2022-09-30 DIAGNOSIS — E669 Obesity, unspecified: Secondary | ICD-10-CM | POA: Diagnosis present

## 2022-09-30 DIAGNOSIS — E860 Dehydration: Secondary | ICD-10-CM | POA: Diagnosis present

## 2022-09-30 DIAGNOSIS — N136 Pyonephrosis: Secondary | ICD-10-CM | POA: Diagnosis present

## 2022-09-30 DIAGNOSIS — Z7985 Long-term (current) use of injectable non-insulin antidiabetic drugs: Secondary | ICD-10-CM

## 2022-09-30 DIAGNOSIS — N135 Crossing vessel and stricture of ureter without hydronephrosis: Secondary | ICD-10-CM

## 2022-09-30 DIAGNOSIS — E1152 Type 2 diabetes mellitus with diabetic peripheral angiopathy with gangrene: Secondary | ICD-10-CM

## 2022-09-30 DIAGNOSIS — Z1152 Encounter for screening for COVID-19: Secondary | ICD-10-CM

## 2022-09-30 DIAGNOSIS — Z8673 Personal history of transient ischemic attack (TIA), and cerebral infarction without residual deficits: Secondary | ICD-10-CM

## 2022-09-30 DIAGNOSIS — E119 Type 2 diabetes mellitus without complications: Secondary | ICD-10-CM

## 2022-09-30 DIAGNOSIS — E11319 Type 2 diabetes mellitus with unspecified diabetic retinopathy without macular edema: Secondary | ICD-10-CM | POA: Diagnosis present

## 2022-09-30 DIAGNOSIS — Z9049 Acquired absence of other specified parts of digestive tract: Secondary | ICD-10-CM

## 2022-09-30 DIAGNOSIS — K5909 Other constipation: Secondary | ICD-10-CM | POA: Diagnosis present

## 2022-09-30 DIAGNOSIS — Z7982 Long term (current) use of aspirin: Secondary | ICD-10-CM

## 2022-09-30 DIAGNOSIS — Z91040 Latex allergy status: Secondary | ICD-10-CM

## 2022-09-30 DIAGNOSIS — E1140 Type 2 diabetes mellitus with diabetic neuropathy, unspecified: Secondary | ICD-10-CM | POA: Diagnosis present

## 2022-09-30 DIAGNOSIS — Z91018 Allergy to other foods: Secondary | ICD-10-CM

## 2022-09-30 DIAGNOSIS — J4489 Other specified chronic obstructive pulmonary disease: Secondary | ICD-10-CM | POA: Diagnosis present

## 2022-09-30 DIAGNOSIS — E872 Acidosis, unspecified: Secondary | ICD-10-CM | POA: Diagnosis present

## 2022-09-30 DIAGNOSIS — J449 Chronic obstructive pulmonary disease, unspecified: Principal | ICD-10-CM | POA: Diagnosis present

## 2022-09-30 DIAGNOSIS — N3289 Other specified disorders of bladder: Secondary | ICD-10-CM | POA: Diagnosis present

## 2022-09-30 HISTORY — DX: Urinary tract infection, site not specified: A41.9

## 2022-09-30 HISTORY — DX: Cerebral infarction, unspecified: I63.9

## 2022-09-30 HISTORY — PX: CYSTOSCOPY WITH STENT PLACEMENT: SHX5790

## 2022-09-30 HISTORY — DX: Sepsis, unspecified organism: N39.0

## 2022-09-30 LAB — CBC WITH DIFFERENTIAL/PLATELET
Abs Immature Granulocytes: 0.2 10*3/uL — ABNORMAL HIGH (ref 0.00–0.07)
Basophils Absolute: 0.1 10*3/uL (ref 0.0–0.1)
Basophils Relative: 0 %
Eosinophils Absolute: 0 10*3/uL (ref 0.0–0.5)
Eosinophils Relative: 0 %
HCT: 35.4 % — ABNORMAL LOW (ref 36.0–46.0)
Hemoglobin: 11.2 g/dL — ABNORMAL LOW (ref 12.0–15.0)
Immature Granulocytes: 1 %
Lymphocytes Relative: 6 %
Lymphs Abs: 1.5 10*3/uL (ref 0.7–4.0)
MCH: 29.3 pg (ref 26.0–34.0)
MCHC: 31.6 g/dL (ref 30.0–36.0)
MCV: 92.7 fL (ref 80.0–100.0)
Monocytes Absolute: 1.7 10*3/uL — ABNORMAL HIGH (ref 0.1–1.0)
Monocytes Relative: 7 %
Neutro Abs: 19.9 10*3/uL — ABNORMAL HIGH (ref 1.7–7.7)
Neutrophils Relative %: 86 %
Platelets: 336 10*3/uL (ref 150–400)
RBC: 3.82 MIL/uL — ABNORMAL LOW (ref 3.87–5.11)
RDW: 12.9 % (ref 11.5–15.5)
WBC: 23.4 10*3/uL — ABNORMAL HIGH (ref 4.0–10.5)
nRBC: 0 % (ref 0.0–0.2)

## 2022-09-30 LAB — COMPREHENSIVE METABOLIC PANEL
ALT: 22 U/L (ref 0–44)
AST: 24 U/L (ref 15–41)
Albumin: 3.3 g/dL — ABNORMAL LOW (ref 3.5–5.0)
Alkaline Phosphatase: 76 U/L (ref 38–126)
Anion gap: 13 (ref 5–15)
BUN: 19 mg/dL (ref 8–23)
CO2: 19 mmol/L — ABNORMAL LOW (ref 22–32)
Calcium: 8.7 mg/dL — ABNORMAL LOW (ref 8.9–10.3)
Chloride: 99 mmol/L (ref 98–111)
Creatinine, Ser: 1.18 mg/dL — ABNORMAL HIGH (ref 0.44–1.00)
GFR, Estimated: 51 mL/min — ABNORMAL LOW (ref >60)
Glucose, Bld: 239 mg/dL — ABNORMAL HIGH (ref 70–99)
Potassium: 4 mmol/L (ref 3.5–5.1)
Sodium: 131 mmol/L — ABNORMAL LOW (ref 135–145)
Total Bilirubin: 0.9 mg/dL (ref 0.3–1.2)
Total Protein: 7 g/dL (ref 6.5–8.1)

## 2022-09-30 LAB — URINALYSIS, W/ REFLEX TO CULTURE (INFECTION SUSPECTED)
Bilirubin Urine: NEGATIVE
Glucose, UA: NEGATIVE mg/dL
Ketones, ur: NEGATIVE mg/dL
Nitrite: POSITIVE — AB
Protein, ur: 30 mg/dL — AB
Specific Gravity, Urine: 1.012 (ref 1.005–1.030)
WBC, UA: >50 WBC/hpf (ref 0–5)
pH: 6 (ref 5.0–8.0)

## 2022-09-30 LAB — LACTIC ACID, PLASMA
Lactic Acid, Venous: 2.6 mmol/L (ref 0.5–1.9)
Lactic Acid, Venous: 5.9 mmol/L (ref 0.5–1.9)

## 2022-09-30 LAB — GLUCOSE, CAPILLARY
Glucose-Capillary: 132 mg/dL — ABNORMAL HIGH (ref 70–99)
Glucose-Capillary: 166 mg/dL — ABNORMAL HIGH (ref 70–99)
Glucose-Capillary: 151 mg/dL — ABNORMAL HIGH (ref 70–99)

## 2022-09-30 LAB — PROTIME-INR
INR: 1.1 (ref 0.8–1.2)
Prothrombin Time: 14.4 seconds (ref 11.4–15.2)

## 2022-09-30 LAB — HIV ANTIBODY (ROUTINE TESTING W REFLEX): HIV Screen 4th Generation wRfx: NONREACTIVE

## 2022-09-30 LAB — RESP PANEL BY RT-PCR (RSV, FLU A&B, COVID)  RVPGX2
Influenza A by PCR: NEGATIVE
Influenza B by PCR: NEGATIVE
Resp Syncytial Virus by PCR: NEGATIVE
SARS Coronavirus 2 by RT PCR: NEGATIVE

## 2022-09-30 SURGERY — CYSTOSCOPY, WITH STENT INSERTION
Anesthesia: General | Laterality: Right

## 2022-09-30 MED ORDER — INSULIN ASPART 100 UNIT/ML IJ SOLN: 0.0000 [IU] | INTRAMUSCULAR | Status: DC | PRN

## 2022-09-30 MED ORDER — LORATADINE 10 MG PO TABS: 10.0000 mg | ORAL_TABLET | Freq: Every day | ORAL | Status: DC | PRN

## 2022-09-30 MED ORDER — IOHEXOL 350 MG/ML SOLN
75.0000 mL | Freq: Once | INTRAVENOUS | Status: AC | PRN
  Administered 2022-09-30: 75 mL via INTRAVENOUS

## 2022-09-30 MED ORDER — ALBUTEROL SULFATE (2.5 MG/3ML) 0.083% IN NEBU
3.0000 mL | INHALATION_SOLUTION | Freq: Four times a day (QID) | RESPIRATORY_TRACT | Status: DC | PRN
  Administered 2022-10-01 (×2): 3 mL via RESPIRATORY_TRACT
  Filled 2022-09-30 (×2): qty 3

## 2022-09-30 MED ORDER — ORAL CARE MOUTH RINSE: 15.0000 mL | Freq: Once | OROMUCOSAL | Status: AC

## 2022-09-30 MED ORDER — SODIUM CHLORIDE 0.9 % IV SOLN
500.0000 mg | Freq: Once | INTRAVENOUS | Status: AC
  Administered 2022-09-30: 500 mg via INTRAVENOUS
  Filled 2022-09-30: qty 5

## 2022-09-30 MED ORDER — ONDANSETRON HCL 4 MG/2ML IJ SOLN
INTRAMUSCULAR | Status: DC | PRN
  Administered 2022-09-30: 4 mg via INTRAVENOUS

## 2022-09-30 MED ORDER — ONDANSETRON HCL 4 MG PO TABS: 4.0000 mg | ORAL_TABLET | Freq: Four times a day (QID) | ORAL | Status: DC | PRN

## 2022-09-30 MED ORDER — LACTATED RINGERS IV SOLN: INTRAVENOUS | Status: DC

## 2022-09-30 MED ORDER — ACETAMINOPHEN 650 MG RE SUPP: 650.0000 mg | Freq: Four times a day (QID) | RECTAL | Status: DC | PRN

## 2022-09-30 MED ORDER — IOHEXOL 300 MG/ML  SOLN
INTRAMUSCULAR | Status: DC | PRN
  Administered 2022-09-30: 8 mL

## 2022-09-30 MED ORDER — ACETAMINOPHEN 325 MG PO TABS
650.0000 mg | ORAL_TABLET | Freq: Four times a day (QID) | ORAL | Status: DC | PRN
  Administered 2022-09-30 – 2022-10-04 (×6): 650 mg via ORAL
  Filled 2022-09-30 (×6): qty 2

## 2022-09-30 MED ORDER — LACTATED RINGERS IV BOLUS (SEPSIS)
1000.0000 mL | Freq: Once | INTRAVENOUS | Status: AC
  Administered 2022-09-30: 1000 mL via INTRAVENOUS

## 2022-09-30 MED ORDER — FENTANYL CITRATE (PF) 250 MCG/5ML IJ SOLN
INTRAMUSCULAR | Status: AC
  Filled 2022-09-30: qty 5

## 2022-09-30 MED ORDER — VANCOMYCIN HCL 1250 MG/250ML IV SOLN
1250.0000 mg | Freq: Once | INTRAVENOUS | Status: DC
  Filled 2022-09-30 (×2): qty 250

## 2022-09-30 MED ORDER — LIDOCAINE 2% (20 MG/ML) 5 ML SYRINGE
INTRAMUSCULAR | Status: DC | PRN
  Administered 2022-09-30: 60 mg via INTRAVENOUS

## 2022-09-30 MED ORDER — BUSPIRONE HCL 5 MG PO TABS
7.5000 mg | ORAL_TABLET | Freq: Two times a day (BID) | ORAL | Status: DC
  Administered 2022-09-30 – 2022-10-04 (×8): 7.5 mg via ORAL
  Filled 2022-09-30: qty 2
  Filled 2022-09-30: qty 1
  Filled 2022-09-30 (×4): qty 2
  Filled 2022-09-30: qty 1
  Filled 2022-09-30 (×3): qty 2

## 2022-09-30 MED ORDER — LACTATED RINGERS IV BOLUS
1000.0000 mL | Freq: Once | INTRAVENOUS | Status: AC
  Administered 2022-09-30: 1000 mL via INTRAVENOUS

## 2022-09-30 MED ORDER — PROMETHAZINE HCL 25 MG/ML IJ SOLN: 6.2500 mg | INTRAMUSCULAR | Status: DC | PRN

## 2022-09-30 MED ORDER — ENOXAPARIN SODIUM 40 MG/0.4ML IJ SOSY
40.0000 mg | PREFILLED_SYRINGE | INTRAMUSCULAR | Status: DC
  Administered 2022-09-30 – 2022-10-02 (×3): 40 mg via SUBCUTANEOUS
  Filled 2022-09-30 (×3): qty 0.4

## 2022-09-30 MED ORDER — NOREPINEPHRINE 4 MG/250ML-% IV SOLN
0.0000 ug/min | INTRAVENOUS | Status: DC
  Administered 2022-09-30: 2 ug/min via INTRAVENOUS
  Filled 2022-09-30: qty 250

## 2022-09-30 MED ORDER — PANTOPRAZOLE SODIUM 40 MG PO TBEC
40.0000 mg | DELAYED_RELEASE_TABLET | Freq: Every day | ORAL | Status: DC
  Administered 2022-09-30 – 2022-10-04 (×5): 40 mg via ORAL
  Filled 2022-09-30 (×5): qty 1

## 2022-09-30 MED ORDER — STERILE WATER FOR IRRIGATION IR SOLN
Status: DC | PRN
  Administered 2022-09-30: 3000 mL

## 2022-09-30 MED ORDER — FENTANYL CITRATE (PF) 250 MCG/5ML IJ SOLN
INTRAMUSCULAR | Status: DC | PRN
  Administered 2022-09-30: 50 ug via INTRAVENOUS

## 2022-09-30 MED ORDER — ONDANSETRON HCL 4 MG/2ML IJ SOLN: 4.0000 mg | Freq: Four times a day (QID) | INTRAMUSCULAR | Status: DC | PRN

## 2022-09-30 MED ORDER — TOPIRAMATE 25 MG PO TABS
25.0000 mg | ORAL_TABLET | Freq: Every day | ORAL | Status: DC
  Administered 2022-09-30 – 2022-10-04 (×5): 25 mg via ORAL
  Filled 2022-09-30 (×5): qty 1

## 2022-09-30 MED ORDER — CHLORHEXIDINE GLUCONATE 0.12 % MT SOLN
OROMUCOSAL | Status: AC
  Administered 2022-09-30: 15 mL via OROMUCOSAL
  Filled 2022-09-30: qty 15

## 2022-09-30 MED ORDER — SODIUM CHLORIDE 0.9 % IV SOLN
2.0000 g | Freq: Once | INTRAVENOUS | Status: AC
  Administered 2022-09-30: 2 g via INTRAVENOUS
  Filled 2022-09-30: qty 12.5

## 2022-09-30 MED ORDER — ROPINIROLE HCL 0.25 MG PO TABS
0.2500 mg | ORAL_TABLET | Freq: Every day | ORAL | Status: DC
  Administered 2022-09-30 – 2022-10-03 (×4): 0.25 mg via ORAL
  Filled 2022-09-30 (×5): qty 1

## 2022-09-30 MED ORDER — PHENYLEPHRINE HCL-NACL 20-0.9 MG/250ML-% IV SOLN
INTRAVENOUS | Status: DC | PRN
  Administered 2022-09-30: 20 ug/min via INTRAVENOUS

## 2022-09-30 MED ORDER — CYCLOBENZAPRINE HCL 5 MG PO TABS
5.0000 mg | ORAL_TABLET | Freq: Two times a day (BID) | ORAL | Status: DC
  Administered 2022-09-30 – 2022-10-04 (×8): 5 mg via ORAL
  Filled 2022-09-30 (×8): qty 1

## 2022-09-30 MED ORDER — PROPOFOL 10 MG/ML IV BOLUS
INTRAVENOUS | Status: DC | PRN
  Administered 2022-09-30: 70 mg via INTRAVENOUS

## 2022-09-30 MED ORDER — ACETAMINOPHEN 500 MG PO TABS: 1000.0000 mg | ORAL_TABLET | Freq: Two times a day (BID) | ORAL | Status: DC | PRN

## 2022-09-30 MED ORDER — WATER FOR IRRIGATION, STERILE IR SOLN
Status: DC | PRN
  Administered 2022-09-30: 1000 mL

## 2022-09-30 MED ORDER — CHLORHEXIDINE GLUCONATE 0.12 % MT SOLN: 15.0000 mL | Freq: Once | OROMUCOSAL | Status: AC

## 2022-09-30 MED ORDER — ATORVASTATIN CALCIUM 40 MG PO TABS
40.0000 mg | ORAL_TABLET | Freq: Every day | ORAL | Status: DC
  Administered 2022-09-30 – 2022-10-04 (×5): 40 mg via ORAL
  Filled 2022-09-30 (×5): qty 1

## 2022-09-30 MED ORDER — GABAPENTIN 400 MG PO CAPS
400.0000 mg | ORAL_CAPSULE | Freq: Three times a day (TID) | ORAL | Status: DC
  Administered 2022-09-30 – 2022-10-04 (×12): 400 mg via ORAL
  Filled 2022-09-30 (×12): qty 1

## 2022-09-30 MED ORDER — INSULIN ASPART 100 UNIT/ML IJ SOLN
0.0000 [IU] | Freq: Three times a day (TID) | INTRAMUSCULAR | Status: DC
  Administered 2022-09-30 – 2022-10-03 (×2): 3 [IU] via SUBCUTANEOUS
  Administered 2022-10-04: 2 [IU] via SUBCUTANEOUS
  Administered 2022-10-04: 3 [IU] via SUBCUTANEOUS

## 2022-09-30 MED ORDER — FENTANYL CITRATE PF 50 MCG/ML IJ SOSY
25.0000 ug | PREFILLED_SYRINGE | Freq: Once | INTRAMUSCULAR | Status: AC
  Administered 2022-09-30: 25 ug via INTRAVENOUS
  Filled 2022-09-30: qty 1

## 2022-09-30 MED ORDER — AMISULPRIDE (ANTIEMETIC) 5 MG/2ML IV SOLN: 10.0000 mg | Freq: Once | INTRAVENOUS | Status: DC | PRN

## 2022-09-30 MED ORDER — SUCCINYLCHOLINE CHLORIDE 200 MG/10ML IV SOSY
PREFILLED_SYRINGE | INTRAVENOUS | Status: DC | PRN
  Administered 2022-09-30: 80 mg via INTRAVENOUS

## 2022-09-30 MED ORDER — VANCOMYCIN HCL IN DEXTROSE 1-5 GM/200ML-% IV SOLN: 1000.0000 mg | Freq: Once | INTRAVENOUS | Status: DC

## 2022-09-30 MED ORDER — SODIUM CHLORIDE 0.9 % IV SOLN
2.0000 g | INTRAVENOUS | Status: DC
  Administered 2022-09-30 – 2022-10-03 (×4): 2 g via INTRAVENOUS
  Filled 2022-09-30 (×4): qty 20

## 2022-09-30 MED ORDER — LACTATED RINGERS IV BOLUS: 500.0000 mL | Freq: Once | INTRAVENOUS | Status: DC

## 2022-09-30 MED ORDER — ONDANSETRON HCL 4 MG/2ML IJ SOLN
4.0000 mg | Freq: Once | INTRAMUSCULAR | Status: AC
  Administered 2022-09-30: 4 mg via INTRAVENOUS
  Filled 2022-09-30: qty 2

## 2022-09-30 MED ORDER — FENTANYL CITRATE (PF) 100 MCG/2ML IJ SOLN: 25.0000 ug | INTRAMUSCULAR | Status: DC | PRN

## 2022-09-30 MED ORDER — PHENYLEPHRINE 80 MCG/ML (10ML) SYRINGE FOR IV PUSH (FOR BLOOD PRESSURE SUPPORT)
PREFILLED_SYRINGE | INTRAVENOUS | Status: DC | PRN
  Administered 2022-09-30: 160 ug via INTRAVENOUS
  Administered 2022-09-30 (×2): 80 ug via INTRAVENOUS
  Administered 2022-09-30: 240 ug via INTRAVENOUS

## 2022-09-30 MED ORDER — ACETAMINOPHEN 325 MG PO TABS
650.0000 mg | ORAL_TABLET | Freq: Once | ORAL | Status: AC
  Administered 2022-09-30: 650 mg via ORAL
  Filled 2022-09-30: qty 2

## 2022-09-30 SURGICAL SUPPLY — 26 items
BAG DRN RND TRDRP ANRFLXCHMBR (UROLOGICAL SUPPLIES) ×1
BAG URINE DRAIN 2000ML AR STRL (UROLOGICAL SUPPLIES) ×1 IMPLANT
BAG URO CATCHER STRL LF (MISCELLANEOUS) ×1 IMPLANT
CATH FOLEY 2WAY SLVR 5CC 16FR (CATHETERS) IMPLANT
CATH URETL OPEN END 6FR 70 (CATHETERS) ×1 IMPLANT
GLOVE BIO SURGEON STRL SZ7.5 (GLOVE) ×1 IMPLANT
GLOVE BIOGEL PI IND STRL 6.5 (GLOVE) IMPLANT
GLOVE SURG SS PI 7.5 STRL IVOR (GLOVE) IMPLANT
GOWN STRL REUS W/ TWL LRG LVL3 (GOWN DISPOSABLE) ×1 IMPLANT
GOWN STRL REUS W/ TWL XL LVL3 (GOWN DISPOSABLE) ×1 IMPLANT
GOWN STRL REUS W/TWL LRG LVL3 (GOWN DISPOSABLE) ×1
GOWN STRL REUS W/TWL XL LVL3 (GOWN DISPOSABLE) ×1
GUIDEWIRE ANG ZIPWIRE 038X150 (WIRE) IMPLANT
GUIDEWIRE STR DUAL SENSOR (WIRE) ×1 IMPLANT
KIT TURNOVER KIT B (KITS) ×1 IMPLANT
MANIFOLD NEPTUNE II (INSTRUMENTS) ×1 IMPLANT
NS IRRIG 1000ML POUR BTL (IV SOLUTION) ×1 IMPLANT
PACK CYSTO (CUSTOM PROCEDURE TRAY) ×1 IMPLANT
STENT URET 6FRX24 CONTOUR (STENTS) IMPLANT
STENT URET 6FRX26 CONTOUR (STENTS) IMPLANT
SYPHON OMNI JUG (MISCELLANEOUS) ×1 IMPLANT
TOWEL GREEN STERILE FF (TOWEL DISPOSABLE) ×1 IMPLANT
TRAY FOL W/BAG SLVR 16FR STRL (SET/KITS/TRAYS/PACK) IMPLANT
TRAY FOLEY W/BAG SLVR 16FR LF (SET/KITS/TRAYS/PACK) ×1
TUBE CONNECTING 12X1/4 (SUCTIONS) IMPLANT
WATER STERILE IRR 3000ML UROMA (IV SOLUTION) ×1 IMPLANT

## 2022-09-30 NOTE — Assessment & Plan Note (Signed)
Cpap at night  

## 2022-09-30 NOTE — Sepsis Progress Note (Signed)
Elink following code sepsis °

## 2022-09-30 NOTE — ED Notes (Signed)
Informed patient placement that patient will be going to OR for a bed post procedure.

## 2022-09-30 NOTE — Assessment & Plan Note (Signed)
Hold on antihypertensive medications. Continue fluid resuscitation.

## 2022-09-30 NOTE — Assessment & Plan Note (Signed)
Continue pantoprazole. °

## 2022-09-30 NOTE — ED Notes (Signed)
ICU MD at bedside

## 2022-09-30 NOTE — ED Notes (Signed)
Reached out to pharmacy about delay in Vancomycin delivery.

## 2022-09-30 NOTE — Assessment & Plan Note (Signed)
Add insulin sliding scale for glucose cover and monitoring. Patient currently NPO for urology procedure.

## 2022-09-30 NOTE — ED Notes (Signed)
Spoke with provider for medication after nonpharmacological interventions for spasms reported by patient. Pt unable to tolerate one position.

## 2022-09-30 NOTE — Assessment & Plan Note (Signed)
Continue oxymetry monitoring No signs of exacerbation Continue with bronchodilatory therapy.

## 2022-09-30 NOTE — Anesthesia Postprocedure Evaluation (Signed)
Anesthesia Post Note  Patient: Anna [name]  Procedure(s) Performed: CYSTOSCOPY WITH STENT PLACEMENT (Right)     Patient location during evaluation: PACU Anesthesia Type: General Level of consciousness: sedated and patient cooperative Pain management: pain level controlled Vital Signs Assessment: post-procedure vital signs reviewed and stable Respiratory status: spontaneous breathing Cardiovascular status: stable Anesthetic complications: no   No notable events documented.  Last Vitals:  Vitals:   09/30/22 1615 09/30/22 1630  BP: (!) 92/55 (!) 90/50  Pulse: 94 94  Resp: 12 13  Temp:  37.6 C  SpO2: 97% 97%    Last Pain:  Vitals:   09/30/22 1630  TempSrc:   PainSc: 0-No pain                 Lewie Loron

## 2022-09-30 NOTE — Transfer of Care (Signed)
Immediate Anesthesia Transfer of Care Note  Patient: Anna [name]  Procedure(s) Performed: CYSTOSCOPY WITH STENT PLACEMENT (Right)  Patient Location: PACU  Anesthesia Type:General  Level of Consciousness: drowsy and patient cooperative  Airway & Oxygen Therapy: Patient Spontanous Breathing and Patient connected to face mask oxygen  Post-op Assessment: Report given to RN and Post -op Vital signs reviewed and stable  Post vital signs: Reviewed and stable  Last Vitals:  Vitals Value Taken Time  BP 114/61 09/30/22 1505  Temp    Pulse 99 09/30/22 1507  Resp 20 09/30/22 1507  SpO2 100 % 09/30/22 1507  Vitals shown include unvalidated device data.  Last Pain:  Vitals:   09/30/22 1335  TempSrc: Oral  PainSc:          Complications: No notable events documented.

## 2022-09-30 NOTE — Consult Note (Addendum)
Urology Consult Note   Requesting Attending Physician:  Linwood Dibbles, MD Service Providing Consult: Urology  Consulting Attending: Dr Modena Slater    Reason for Consult:  Sepsis with UPJO  HPI: Anna [name] is seen in consultation for reasons noted above at the request of Linwood Dibbles, MD for evaluation of sepsis with c/f urinary source as well as chronic UPJO.  This is a 67 y.o. female with h/o stroke on Plavix (last dose yesterday, asthma, HTN,HLD, migraine who presents with fever and chills  and mild nausea. She had one episode of vomiting. Denies hematuria. No dyuria, urgency or frequency. Denies flank pain or lower abdominal pain.    She has a history of stroke in December 2023, now on plavix.   In the ED, patient was febrile to a Tmax 100.5. She required NE and was tachycardic to 106. She was given 1 dose of cefepime and ceftriaxone. She was given sepsis fluid protocol. UA with c/f +LE, nitrites, few bacteria. Lactate elevated to 5.9. WBC of 23.4, and Cr 1.18 from base line 0.8-1.      Past Medical History: Past Medical History:  Diagnosis Date   Arthritis    Asthma    Back pain    Bronchitis    Bursitis    Diabetes mellitus    Diabetic neuropathy (HCC)    Diabetic retinopathy    Hyperlipemia    Hypertension    Migraine    Obesity    Sepsis due to urinary tract infection [facility])     Past Surgical History:  Past Surgical History:  Procedure Laterality Date   ABDOMINAL AORTOGRAM W/LOWER EXTREMITY Left 08/22/2021   Procedure: ABDOMINAL AORTOGRAM W/LOWER EXTREMITY;  Surgeon: Nada Libman, MD;  Location: [facility];  Service: Cardiovascular;  Laterality: Left;   AMPUTATION Left 10/11/2021   Procedure: LEFT BELOW KNEE AMPUTATION;  Surgeon: Nadara Mustard, MD;  Location: [facility] OR;  Service: Orthopedics;  Laterality: Left;   CESAREAN SECTION     x3   CHOLECYSTECTOMY     EYE SURGERY Left    METATARSAL HEAD EXCISION Left 08/24/2021   Procedure: METATARSAL HEAD  EXCISION FIFTH TOE;  Surgeon: Vivi Barrack, DPM;  Location: [facility] OR;  Service: Podiatry;  Laterality: Left;   PERIPHERAL VASCULAR BALLOON ANGIOPLASTY  08/22/2021   Procedure: PERIPHERAL VASCULAR BALLOON ANGIOPLASTY;  Surgeon: Nada Libman, MD;  Location: [facility];  Service: Cardiovascular;;  Left AT   WOUND DEBRIDEMENT Left 08/24/2021   Procedure: DEBRIDEMENT WOUND OF LEFT FOOT;  Surgeon: Vivi Barrack, DPM;  Location: [facility] OR;  Service: Podiatry;  Laterality: Left;    Medication: Current Facility-Administered Medications  Medication Dose Route Frequency Provider Last Rate Last Admin   cefTRIAXone (ROCEPHIN) 2 g in sodium chloride 0.9 % 100 mL IVPB  2 g Intravenous Q24H Cheri Fowler, MD       lactated ringers infusion   Intravenous Continuous Mare Ferrari, PA-C 150 mL/hr at 09/30/22 0928 New Bag at 09/30/22 0928   norepinephrine (LEVOPHED) 4mg  in (0.016 mg/mL) premix infusion  0-40 mcg/min Intravenous Continuous Mare Ferrari, PA-C   Stopped at 09/30/22 1112   Current Outpatient Medications  Medication Sig Dispense Refill   acetaminophen (TYLENOL) 500 MG tablet Take 1,000 mg by mouth 2 (two) times daily as needed for moderate pain.     albuterol (VENTOLIN HFA) 108 (90 Base) MCG/ACT inhaler Inhale 1-2 puffs into the lungs every 6 (six) hours as needed for  wheezing or shortness of breath.     aspirin EC 81 MG tablet Take 81 mg by mouth daily. Swallow whole.     atorvastatin (LIPITOR) 40 MG tablet Take 40 mg by mouth daily.     busPIRone (BUSPAR) 7.5 MG tablet Take 7.5 mg by mouth 2 (two) times daily.     calamine lotion Apply 1 Application topically 3 (three) times daily as needed for itching (to insect bites on right breast and right upper back).     clopidogrel (PLAVIX) 75 MG tablet Take 75 mg by mouth daily.     cyclobenzaprine (FLEXERIL) 5 MG tablet Take 5 mg by mouth in the morning and at bedtime.     docusate sodium (COLACE) 100 MG capsule Take 100 mg by  mouth 2 (two) times daily as needed for mild constipation.     Dulaglutide 3 MG/0.5ML SOPN Inject 3 mg into the skin once a week. Saturday     estradiol (ESTRACE) 0.1 MG/GM vaginal cream Place 1 Applicatorful vaginally as needed (weekly as needed for dryness).     fluticasone (FLONASE) 50 MCG/ACT nasal spray Place 1 spray into both nostrils daily as needed for allergies or rhinitis.     gabapentin (NEURONTIN) 400 MG capsule Take 400 mg by mouth 3 (three) times daily.     hydrOXYzine (ATARAX) 10 MG tablet Take 1 tablet (10 mg total) by mouth 2 (two) times daily. (Patient taking differently: Take 10 mg by mouth 2 (two) times daily as needed for itching.) 30 tablet 0   insulin glargine (LANTUS) 100 unit/mL SOPN Inject 25 Units into the skin at bedtime.     Lactobacillus Rhamnosus, GG, (CULTURELLE HEALTH & WELLNESS) CAPS Take 1 capsule by mouth daily.     lidocaine 4 % Place 1 patch onto the skin daily as needed (pain).     lisinopril (ZESTRIL) 5 MG tablet Take 5 mg by mouth daily.     loratadine (CLARITIN) 10 MG tablet Take 10 mg by mouth daily as needed for allergies.     meclizine (ANTIVERT) 12.5 MG tablet Take 12.5 mg by mouth daily as needed for dizziness.     Menthol, Topical Analgesic, (BIOFREEZE) 4 % GEL Apply 1 application  topically 4 (four) times daily as needed (to right side back, neck, shoulder and hip for pain).     metFORMIN (GLUCOPHAGE) 1000 MG tablet Take 1,000 mg by mouth 2 (two) times daily with a meal.     Multiple Vitamin (MULTIVITAMIN WITH MINERALS) TABS tablet Take 1 tablet by mouth daily.     naloxone (NARCAN) nasal spray 4 mg/0.1 mL Place 1 spray into the nose once as needed (for opiod overdose).     nystatin cream (MYCOSTATIN) Apply 1 application. topically 4 (four) times daily as needed for dry skin.     ondansetron (ZOFRAN) 4 MG tablet Take 4 mg by mouth every 8 (eight) hours as needed for nausea or vomiting.     OVER THE COUNTER MEDICATION Take 1 tablet by mouth daily as  needed (concentration).     pantoprazole (PROTONIX) 40 MG tablet Take 40 mg by mouth daily.     polyethylene glycol (MIRALAX / GLYCOLAX) 17 g packet Take 17 g by mouth daily as needed for moderate constipation.     rOPINIRole (REQUIP) 0.25 MG tablet Take 0.25 mg by mouth at bedtime.     sucralfate (CARAFATE) 1 GM/10ML suspension Take 1 g by mouth 4 (four) times daily as needed (stomch ulcer).  topiramate (TOPAMAX) 25 MG tablet Take 1 tablet (25 mg total) by mouth daily. 30 tablet 0    Allergies: Allergies  Allergen Reactions   Bacitracin     Other reaction(s): Other Burns skin     Citalopram Diarrhea   Doxycycline Other (See Comments)    Other reaction(s): Other "burning" all over body     Morphine And Codeine Hives   Neomycin     Unknown reaction Not listed on the MAR    Ciprofloxacin     Tongue swells   Ciprofloxacin-Dexamethasone Other (See Comments)    unknown    Duloxetine     Other reaction(s): Mental Status Changes (intolerance) "saw pictures of gun"   Latex Itching   Levaquin [Levofloxacin In D5w] Other (See Comments)    Pt does not remember reaction but states she's had issues with med   Piper     Other reaction(s): Other (See Comments) White pepper- uncontrollable sneezing    Salvia Officinalis     Sneezing Not listed on the [facility]   Soma [Carisoprodol]     Sleepy and constipation  Not listed on the [facility]   Lasix [Furosemide] Rash    Social History: Social History   Tobacco Use   Smoking status: Never   Smokeless tobacco: Never  Substance Use Topics   Alcohol use: No   Drug use: No    Family History History reviewed. No pertinent family history.  Review of Systems 10 systems were reviewed and are negative except as noted specifically in the HPI.  Objective   Vital signs in last 24 hours: BP (!) 172/81   Pulse 95   Temp 98 F (36.7 C) (Oral)   Resp (!) 21   Ht 5\' 2"  (1.575 m)   Wt 58.1 kg   SpO2 99%   BMI 23.41 kg/m   Physical  Exam General: NAD, A&O, resting, appropriate HEENT: [place]/AT, EOMI, MMM Pulmonary: Normal work of breathing Cardiovascular: HDS, adequate peripheral perfusion Abdomen: Soft, NTTP, nondistended, . GU: voiding spontaenously, No CVA tenderness Extremities: warm and well perfused Neuro: Appropriate, no focal neurological deficits  Most Recent Labs: Lab Results  Component Value Date   WBC 23.4 (H) 09/30/2022   HGB 11.2 (L) 09/30/2022   HCT 35.4 (L) 09/30/2022   PLT 336 09/30/2022    Lab Results  Component Value Date   NA 131 (L) 09/30/2022   K 4.0 09/30/2022   CL 99 09/30/2022   CO2 19 (L) 09/30/2022   BUN 19 09/30/2022   CREATININE 1.18 (H) 09/30/2022   CALCIUM 8.7 (L) 09/30/2022   MG 2.2 10/06/2021   PHOS 3.5 08/20/2021    Lab Results  Component Value Date   INR 1.1 09/30/2022     Urine Culture: @[id number] (laburin,org,[id number],[id number])@   IMAGING: CT CHEST ABDOMEN PELVIS W CONTRAST  Result Date: 09/30/2022 CLINICAL DATA:  Sepsis. EXAM: CT CHEST, ABDOMEN, AND PELVIS WITH CONTRAST TECHNIQUE: Multidetector CT imaging of the chest, abdomen and pelvis was performed following the standard protocol during bolus administration of intravenous contrast. RADIATION DOSE REDUCTION: This exam was performed according to the departmental dose-optimization program which includes automated exposure control, adjustment of the mA and/or kV according to patient size and/or use of iterative reconstruction technique. CONTRAST:  75mL OMNIPAQUE IOHEXOL 350 MG/ML SOLN COMPARISON:  CT angio chest 08/02/2020 and CT AP from 06/23/2014 FINDINGS: CT CHEST FINDINGS Cardiovascular: Heart size is normal. No pericardial effusion. Aortic atherosclerosis and coronary artery calcification. Mediastinum/Nodes: Thyroid gland, trachea, and esophagus are unremarkable. No  enlarged mediastinal or hilar lymph nodes. Lungs/Pleura: No pleural fluid, pneumothorax or interstitial edema. Patchy ground-glass attenuation is noted  within the superior segment of the left lower lobe, mild patchy ground-glass attenuation is scattered within the left lower lobe. Bibasilar peripheral subpleural reticulation is noted. No airspace consolidation. Small nodule in the lateral right apex is unchanged measuring 4 mm. 3 mm right middle lobe lung nodule is also stable. These are compatible with a benign process require no further follow-up. Musculoskeletal: No acute or suspicious osseous findings. CT ABDOMEN PELVIS FINDINGS Hepatobiliary: No focal liver abnormality is seen. Status post cholecystectomy. No biliary dilatation. Pancreas: Unremarkable. No pancreatic ductal dilatation or surrounding inflammatory changes. Spleen: Normal in size without focal abnormality. Adrenals/Urinary Tract: Normal adrenal glands. Asymmetric right-sided pelvocaliectasis with dilated extrarenal pelvis to the UPJ. This is slightly progressive compared with the previous exam. No obstructing stone identified. Normal caliber right ureter. There is mild right-sided perinephric fat stranding which is new from the previous exam. Equivocal striated nephrograms within the right kidney noted on the delayed images. Unremarkable appearance of the left kidney with chronic left extrarenal pelvis. Tiny focus of gas noted within the non dependent portion of the bladder lumen. No focal bladder abnormality noted. Stomach/Bowel: Stomach appears normal. The appendix is not confidently identified. No secondary signs of acute appendicitis. No pathologic dilatation of large or small bowel loops. Mild stool burden is noted throughout the colon. Vascular/Lymphatic: Aortic atherosclerosis. No enlarged abdominopelvic lymph nodes. Increased multiplicity of normal size retroperitoneal lymph nodes identified. Reproductive: Uterus and bilateral adnexa are unremarkable. Other: No free fluid or fluid collections. Fat containing infraumbilical ventral abdominal wall hernias noted which measures 5.8 cm. No  signs of pneumoperitoneum. Musculoskeletal: Multilevel degenerative disc disease noted throughout the lumbar spine. No acute or suspicious osseous findings. IMPRESSION: 1. Patchy ground-glass attenuation within the superior segment of the left lower lobe and left lower lobe, which may reflect mild atypical infection or inflammation. 2. Chronic, mildly progressive dilatation extrarenal to the level of the UPJ. Normal caliber right ureter. No obstructing stone identified. 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 3. Mild right-sided perinephric fat stranding with equivocal striated nephrograms within the right kidney. A small amount of gas is noted within the non dependent portion of the urinary bladder. Cannot exclude urinary tract infection. Correlation with urinalysis is recommended. 4. Fat containing infraumbilical ventral abdominal wall hernia. 5. Aortic Atherosclerosis (ICD10-I70.0). Electronically Signed   By: Signa Kell M.D.   On: 09/30/2022 10:13   DG Chest 2 View  Result Date: 09/30/2022 CLINICAL DATA:  Sepsis suspected EXAM: CHEST - 2 VIEW COMPARISON:  04/14/2022 FINDINGS: The heart size and mediastinal contours are within normal limits. Both lungs are clear. The visualized skeletal structures are unremarkable. IMPRESSION: No active cardiopulmonary disease. Electronically Signed   By: Signa Kell M.D.   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 We discussed stent vs PCN. With UPJO, usually prefer PCN however given the fact that patient is currently on plavix, unable to proceed with PCN. At this time, we try to place a ureteral stent. We discussed the possibilty of not being able to pass a stent, in that case, we may need to wait a few days and have IR place a PCN.   For sepsis, follow up Ucx. Continue BSA.    Recommendations: #urinary sepsis with UPJO -plan for  urgent  right stent placement today -continue NPO status -continue BSA, follow up Ucx, treat for total course of 14 day -place foley catheter for maximal drainage. Remove when afebrile for 24 hours. -pt will need OP work up of UPJO.   #AKI -Trend Creatinine    Thank you for this consult. Please contact the urology consult pager with any further questions/concerns.

## 2022-09-30 NOTE — H&P (Addendum)
History and Physical    Patient: Anna [name] WUJ:[id number] DOB: 07-Mar-1956 DOA: 09/30/2022 DOS: the patient was seen and examined on 09/30/2022 PCP: Jethro Bastos, MD  Patient coming from: SN[facility]  Chief Complaint:  Chief Complaint  Patient presents with   Hypotension   Fever   HPI: Anna [name] is a 67 y.o. female with medical history significant of T2DM, hyperlipidemia, hypertension, obesity, left AKA,  and asthma who presented with generalized weakness.  Patient reports not feeling well for the last 3 to 4 days, feeling very weak and deconditioned. Today not able to get into her wheelchair due to severe weakness. No chest pain, abdominal pain or flank pain. Denies urinary symptoms like dysuria or increased urinary frequency.  Per ED report patient has been hypotensive and febrile over the last 24 hrs.  EMS was called and she was transported to the ED.  Code sepsis was called and patient was placed on IV fluids and IV antibiotics. She required briefly peripheral vasopressor therapy with norepinephrine that now has been weaned off. Urology has evaluated the patient and plan to intervene right UPJ obstruction.   Review of Systems: As mentioned in the history of present illness. All other systems reviewed and are negative. Past Medical History:  Diagnosis Date   Arthritis    Asthma    Back pain    Bronchitis    Bursitis    Diabetes mellitus    Diabetic neuropathy (HCC)    Diabetic retinopathy    Hyperlipemia    Hypertension    Migraine    Obesity    Sepsis due to urinary tract infection [facility])    Past Surgical History:  Procedure Laterality Date   ABDOMINAL AORTOGRAM W/LOWER EXTREMITY Left 08/22/2021   Procedure: ABDOMINAL AORTOGRAM W/LOWER EXTREMITY;  Surgeon: Nada Libman, MD;  Location: [facility] INVASIVE [facility] LAB;  Service: Cardiovascular;  Laterality: Left;   AMPUTATION Left 10/11/2021   Procedure: LEFT BELOW KNEE AMPUTATION;  Surgeon: Nadara Mustard, MD;  Location:  [facility] OR;  Service: Orthopedics;  Laterality: Left;   CESAREAN SECTION     x3   CHOLECYSTECTOMY     EYE SURGERY Left    METATARSAL HEAD EXCISION Left 08/24/2021   Procedure: METATARSAL HEAD EXCISION FIFTH TOE;  Surgeon: Vivi Barrack, DPM;  Location: [facility] OR;  Service: Podiatry;  Laterality: Left;   PERIPHERAL VASCULAR BALLOON ANGIOPLASTY  08/22/2021   Procedure: PERIPHERAL VASCULAR BALLOON ANGIOPLASTY;  Surgeon: Nada Libman, MD;  Location: [facility] INV[facility];  Service: Cardiovascular;;  Left AT   WOUND DEBRIDEMENT Left 08/24/2021   Procedure: DEBRIDEMENT WOUND OF LEFT FOOT;  Surgeon: Vivi Barrack, DPM;  Location: [facility] OR;  Service: Podiatry;  Laterality: Left;   Social History:  reports that she has never smoked. She has never used smokeless tobacco. She reports that she does not drink alcohol and does not use drugs.  Allergies  Allergen Reactions   Bacitracin     Other reaction(s): Other Burns skin     Citalopram Diarrhea   Doxycycline Other (See Comments)    Other reaction(s): Other "burning" all over body     Morphine And Codeine Hives   Neomycin     Unknown reaction Not listed on the MAR    Ciprofloxacin     Tongue swells   Ciprofloxacin-Dexamethasone Other (See Comments)    unknown    Duloxetine     Other reaction(s): Mental Status Changes (intolerance) "saw pictures of gun"   Latex  Itching   Levaquin [Levofloxacin In D5w] Other (See Comments)    Pt does not remember reaction but states she's had issues with med   Piper     Other reaction(s): Other (See Comments) White pepper- uncontrollable sneezing    Salvia Officinalis     Sneezing Not listed on the [facility]   Soma [Carisoprodol]     Sleepy and constipation  Not listed on the [facility]   Lasix [Furosemide] Rash    History reviewed. No pertinent family history.  Prior to Admission medications   Medication Sig Start Date End Date Taking? Authorizing Provider  acetaminophen (TYLENOL) 500 MG tablet Take 1,000  mg by mouth 2 (two) times daily as needed for moderate pain.   Yes [provider]  albuterol (VENTOLIN HFA) 108 (90 Base) MCG/ACT inhaler Inhale 1-2 puffs into the lungs every 6 (six) hours as needed for wheezing or shortness of breath.   Yes [provider]  aspirin EC 81 MG tablet Take 81 mg by mouth daily. Swallow whole.   Yes [provider]  atorvastatin (LIPITOR) 40 MG tablet Take 40 mg by mouth daily.   Yes [provider]  busPIRone (BUSPAR) 7.5 MG tablet Take 7.5 mg by mouth 2 (two) times daily.   Yes [provider]  calamine lotion Apply 1 Application topically 3 (three) times daily as needed for itching (to insect bites on right breast and right upper back).   Yes [provider]  clopidogrel (PLAVIX) 75 MG tablet Take 75 mg by mouth daily.   Yes [provider]  cyclobenzaprine (FLEXERIL) 5 MG tablet Take 5 mg by mouth in the morning and at bedtime.   Yes [provider]  docusate sodium (COLACE) 100 MG capsule Take 100 mg by mouth 2 (two) times daily as needed for mild constipation.   Yes [provider]  Dulaglutide 3 MG/0.5ML SOPN Inject 3 mg into the skin once a week. Saturday   Yes [provider]  estradiol (ESTRACE) 0.1 MG/GM vaginal cream Place 1 Applicatorful vaginally as needed (weekly as needed for dryness).   Yes [provider]  fluticasone (FLONASE) 50 MCG/ACT nasal spray Place 1 spray into both nostrils daily as needed for allergies or rhinitis.   Yes [provider]  gabapentin (NEURONTIN) 400 MG capsule Take 400 mg by mouth 3 (three) times daily.   Yes [provider]  hydrOXYzine (ATARAX) 10 MG tablet Take 1 tablet (10 mg total) by mouth 2 (two) times daily. Patient taking differently: Take 10 mg by mouth 2 (two) times daily as needed for itching. 10/13/21  Yes Rhetta Mura, MD  insulin glargine (LANTUS) 100 unit/mL SOPN Inject 25 Units into the  skin at bedtime.   Yes [provider]  Lactobacillus Rhamnosus, GG, (CULTURELLE HEALTH & WELLNESS) CAPS Take 1 capsule by mouth daily. 08/05/21  Yes [provider]  lidocaine 4 % Place 1 patch onto the skin daily as needed (pain).   Yes [provider]  lisinopril (ZESTRIL) 5 MG tablet Take 5 mg by mouth daily.   Yes [provider]  loratadine (CLARITIN) 10 MG tablet Take 10 mg by mouth daily as needed for allergies.   Yes [provider]  meclizine (ANTIVERT) 12.5 MG tablet Take 12.5 mg by mouth daily as needed for dizziness. 10/01/19  Yes [provider]  Menthol, Topical Analgesic, (BIOFREEZE) 4 % GEL Apply 1 application  topically 4 (four) times daily as needed (to right side  back, neck, shoulder and hip for pain).   Yes [provider]  metFORMIN (GLUCOPHAGE) 1000 MG tablet Take 1,000 mg by mouth 2 (two) times daily with a meal.   Yes [provider]  Multiple Vitamin (MULTIVITAMIN WITH MINERALS) TABS tablet Take 1 tablet by mouth daily.   Yes [provider]  naloxone (NARCAN) nasal spray 4 mg/0.1 mL Place 1 spray into the nose once as needed (for opiod overdose).   Yes [provider]  nystatin cream (MYCOSTATIN) Apply 1 application. topically 4 (four) times daily as needed for dry skin.   Yes [provider]  ondansetron (ZOFRAN) 4 MG tablet Take 4 mg by mouth every 8 (eight) hours as needed for nausea or vomiting.   Yes [provider]  OVER THE COUNTER MEDICATION Take 1 tablet by mouth daily as needed (concentration).   Yes [provider]  pantoprazole (PROTONIX) 40 MG tablet Take 40 mg by mouth daily.   Yes [provider]  polyethylene glycol (MIRALAX / GLYCOLAX) 17 g packet Take 17 g by mouth daily as needed for moderate constipation.   Yes [provider]  rOPINIRole (REQUIP) 0.25 MG tablet Take 0.25 mg by mouth at bedtime.   Yes [provider]   sucralfate (CARAFATE) 1 GM/10ML suspension Take 1 g by mouth 4 (four) times daily as needed (stomch ulcer).   Yes [provider]  topiramate (TOPAMAX) 25 MG tablet Take 1 tablet (25 mg total) by mouth daily. 10/13/21  Yes Rhetta Mura, MD    Physical Exam: Vitals:   09/30/22 1100 09/30/22 1110 09/30/22 1120 09/30/22 1201  BP: (!) 172/81 (!) 177/78 (!) 120/93 (!) 143/91  Pulse: 95 100 (!) 106 (!) 127  Resp: (!) 21 20 19 19   Temp:    99.9 F (37.7 C)  TempSrc:    Oral  SpO2: 99% 98% 98% 98%  Weight:      Height:       Neurology awake and alert, deconditioned and ill looking appearing. She is having shaking chills at the time of my examination.  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  Data Reviewed:   Na 131, K 4,0 Cl 99, bicarbonate 19, glucose 239, bun 19 cr 1,18 Lactic acid 2,6 and 5,9  Wbc 23.4 hgb 11.2 plt 336  Sars covid 19 negative   Urina analysis SG 1,012, 30 protein, positive nitrates, moderate leukocytes, small hgb, >50 wbc   CT chest/ abdomen and pelvis.  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 Normal caliber ureter. Non obstructing stone identified. 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 Mild right sided perinephric stranding with equivocal striated nephrograms within the right kidney.   Chest radiograph with no infiltrates, no cardiomegaly or effusions.   EKG 90 bpm, normal axis, normal intervals, sinus rhythm with no significant ST segment or T wave changes.   Assessment and Plan: * Sepsis secondary to UTI [facility]) Right pyelonephritis.  Patient with severe sepsis, with hypotension.  Now is off  vasopressors after aggressive IV fluids resuscitation 3 L LR.  Plan to admit to progressive care unit.  continue antibiotic therapy with broad spectrum antibiotic therapy with  IV ceftriaxone. Continue lactated ringers at 100 ml per hr Source control with Urology intervention, plan for stent to the right.  Follow up on cell count, cultures and temperature curve Will have a low threshold to resume vasopressor therapy.   Essential hypertension Hold on antihypertensive medications. Continue fluid resuscitation.   Type 2 diabetes mellitus (HCC) Add insulin sliding scale for glucose cover and monitoring. Patient currently NPO for urology procedure.   COPD mixed type (HCC) Continue oxymetry monitoring No signs of exacerbation Continue with bronchodilatory therapy.   OSA (obstructive sleep apnea) Cpap at night   GERD (gastroesophageal reflux disease) Continue pantoprazole.    Patient critically ill with severe sepsis, transitory shock, requiring aggressive IV therapy. Critical care time 60 minutes.    Advance Care Planning:   Code Status: Full Code   Consults: none   Family Communication: no family at the bedside   Severity of Illness: The appropriate patient status for this patient is INPATIENT. Inpatient status is judged to be reasonable and necessary in order to provide the required intensity of service to ensure the patient's safety. The patient's presenting symptoms, physical exam findings, and initial radiographic and laboratory data in the context of their chronic comorbidities is felt to place them at high risk for further clinical deterioration. Furthermore, it is not anticipated that the patient will be medically stable for discharge from the hospital within 2 midnights of admission.   * I certify that at the point of admission it is my clinical judgment that the patient will require inpatient hospital care spanning beyond 2 midnights from the point of admission due to  high intensity of service, high risk for further deterioration and high frequency of surveillance required.*  Author: Coralie Keens, MD 09/30/2022 12:05 PM  For on call review [email or web address].

## 2022-09-30 NOTE — ED Notes (Signed)
ED TO INPATIENT HANDOFF REPORT  ED Nurse Name and Phone #: [phone]  S Name/Age/Gender Anna [name] 67 y.o. female Room/Bed: OTFC/OTF  Code Status   Code Status: Full Code  Home/SNF/Other Skilled nursing facility Patient oriented to: self, place, time,situation Is this baseline? Yes   Triage Complete: Triage complete  Chief Complaint Sepsis secondary to UTI (HCC) [A41.9, N39.0]  Triage Note Pt arrived from [place] via [facility] c/o Hypotension and fever that began yesterday.    Allergies Allergies  Allergen Reactions   Bacitracin     Other reaction(s): Other Burns skin     Citalopram Diarrhea   Doxycycline Other (See Comments)    Other reaction(s): Other "burning" all over body     Morphine And Codeine Hives   Neomycin     Unknown reaction Not listed on the MAR    Ciprofloxacin     Tongue swells   Ciprofloxacin-Dexamethasone Other (See Comments)    unknown    Duloxetine     Other reaction(s): Mental Status Changes (intolerance) "saw pictures of gun"   Latex Itching   Levaquin [Levofloxacin In D5w] Other (See Comments)    Pt does not remember reaction but states she's had issues with med   Piper     Other reaction(s): Other (See Comments) White pepper- uncontrollable sneezing    Salvia Officinalis     Sneezing Not listed on the [facility]   Soma [Carisoprodol]     Sleepy and constipation  Not listed on the [facility]   Lasix [Furosemide] Rash    Level of Care/Admitting Diagnosis ED Disposition     ED Disposition  Admit   Condition  --   Comment  Hospital Area: [facility] [[id number]]  Level of Care: Progressive [102]  Admit to Progressive based on following criteria: MULTISYSTEM THREATS such as stable sepsis, metabolic/electrolyte imbalance with or without encephalopathy that is responding to early treatment.  May admit patient to [facility] or [facility] if equivalent level of care is available:: No  Covid Evaluation: Asymptomatic -  no recent exposure (last 10 days) testing not required  Diagnosis: Sepsis secondary to UTI [facility]) [[id number]]  Admitting Physician: Coralie Keens [[id number]]  Attending Physician: Coralie Keens [[id number]]  Certification:: I certify this patient will need inpatient services for at least 2 midnights  Estimated Length of Stay: 3          B Medical/Surgery History Past Medical History:  Diagnosis Date   Arthritis    Asthma    Back pain    Bronchitis    Bursitis    Diabetes mellitus    Diabetic neuropathy (HCC)    Diabetic retinopathy    Hyperlipemia    Hypertension    Migraine    Obesity    Sepsis due to urinary tract infection [facility])    Past Surgical History:  Procedure Laterality Date   ABDOMINAL AORTOGRAM W/LOWER EXTREMITY Left 08/22/2021   Procedure: ABDOMINAL AORTOGRAM W/LOWER EXTREMITY;  Surgeon: Nada Libman, MD;  Location: [facility] INVASIVE CV LAB;  Service: Cardiovascular;  Laterality: Left;   AMPUTATION Left 10/11/2021   Procedure: LEFT BELOW KNEE AMPUTATION;  Surgeon: Nadara Mustard, MD;  Location: [facility] OR;  Service: Orthopedics;  Laterality: Left;   CESAREAN SECTION     x3   CHOLECYSTECTOMY     EYE SURGERY Left    METATARSAL HEAD EXCISION Left 08/24/2021   Procedure: METATARSAL HEAD EXCISION FIFTH TOE;  Surgeon: Vivi Barrack, DPM;  Location: [facility] OR;  Service: Podiatry;  Laterality: Left;   PERIPHERAL VASCULAR BALLOON ANGIOPLASTY  08/22/2021   Procedure: PERIPHERAL VASCULAR BALLOON ANGIOPLASTY;  Surgeon: Nada Libman, MD;  Location: [facility] INVAS[facility] CV LAB;  Service: Cardiovascular;;  Left AT   WOUND DEBRIDEMENT Left 08/24/2021   Procedure: DEBRIDEMENT WOUND OF LEFT FOOT;  Surgeon: Vivi Barrack, DPM;  Location: [facility] OR;  Service: Podiatry;  Laterality: Left;     A IV Location/Drains/Wounds Patient Lines/Drains/Airways Status     Active Line/Drains/Airways     Name Placement date Placement time Site Days   Peripheral IV 09/30/22 18 G Anterior;Left  Forearm 09/30/22  0626  Forearm  less than 1   Peripheral IV 09/30/22 20 G Left Antecubital 09/30/22  0630  Antecubital  less than 1   Negative Pressure Wound Therapy Knee Left 10/11/21  1320  --  354   Wound / Incision (Open or Dehisced) 08/19/21 Diabetic ulcer Toe (Comment  which one) Anterior;Left 08/19/21  2115  Toe (Comment  which one)  407   Wound / Incision (Open or Dehisced) 08/19/21 Other (Comment) Toe (Comment  which one) Left 08/19/21  --  Toe (Comment  which one)  407   Wound / Incision (Open or Dehisced) 10/06/21 Irritant Dermatitis (Moisture Associated Skin Damage) Breast Right;Left;Anterior;Lateral;Medial 10/06/21  2045  Breast  359   Wound / Incision (Open or Dehisced) 10/06/21 Non-pressure wound Foot Left;Lateral 10/06/21  2045  Foot  359   Wound / Incision (Open or Dehisced) 10/06/21 Diabetic ulcer Toe (Comment  which one) Left;Other (Comment) 10/06/21  2045  Toe (Comment  which one)  359   Wound / Incision (Open or Dehisced) 10/06/21 Diabetic ulcer Foot Left;Medial;Anterior 10/06/21  2045  Foot  359   Wound / Incision (Open or Dehisced) 10/06/21 Diabetic ulcer Toe (Comment  which one) Left;Anterior 10/06/21  2045  Toe (Comment  which one)  359            Intake/Output Last 24 hours  Intake/Output Summary (Last 24 hours) at 09/30/2022 1321 Last data filed at 09/30/2022 1113 Gross per 24 hour  Intake 8.49 ml  Output 800 ml  Net -791.51 ml    Labs/Imaging Results for orders placed or performed during the hospital encounter of 09/30/22 (from the past 48 hour(s))  Comprehensive metabolic panel     Status: Abnormal   Collection Time: 09/30/22  6:25 AM  Result Value Ref Range   Sodium 131 (L) 135 - 145 mmol/L   Potassium 4.0 3.5 - 5.1 mmol/L   Chloride 99 98 - 111 mmol/L   CO2 19 (L) 22 - 32 mmol/L   Glucose, Bld 239 (H) 70 - 99 mg/dL    Comment: Glucose reference range applies only to samples taken after fasting for at least 8 hours.   BUN 19 8 - 23 mg/dL   Creatinine,  Ser 1.61 (H) 0.44 - 1.00 mg/dL   Calcium 8.7 (L) 8.9 - 10.3 mg/dL   Total Protein 7.0 6.5 - 8.1 g/dL   Albumin 3.3 (L) 3.5 - 5.0 g/dL   AST 24 15 - 41 U/L   ALT 22 0 - 44 U/L   Alkaline Phosphatase 76 38 - 126 U/L   Total Bilirubin 0.9 0.3 - 1.2 mg/dL   GFR, Estimated 51 (L) >60 mL/min    Comment: (NOTE) Calculated using the CKD-EPI Creatinine Equation (2021)    Anion gap 13 5 - 15    Comment: Performed at [facility], [address]  [place], [place] [address]  Lactic acid, plasma     Status: Abnormal   Collection Time: 09/30/22  6:25 AM  Result Value Ref Range   Lactic Acid, Venous 2.6 (HH) 0.5 - 1.9 mmol/L    Comment: CRITICAL RESULT CALLED TO, READ BACK BY AND VERIFIED WITH E ENELLO RN AT [phone] [id number] BY D LONG Performed at [facility], [address] [place], [place] [address]   CBC with Differential     Status: Abnormal   Collection Time: 09/30/22  6:25 AM  Result Value Ref Range   WBC 23.4 (H) 4.0 - 10.5 K/uL   RBC 3.82 (L) 3.87 - 5.11 MIL/uL   Hemoglobin 11.2 (L) 12.0 - 15.0 g/dL   HCT 29.5 (L) 62.1 - 30.8 %   MCV 92.7 80.0 - 100.0 fL   MCH 29.3 26.0 - 34.0 pg   MCHC 31.6 30.0 - 36.0 g/dL   RDW 65.7 84.6 - 96.2 %   Platelets 336 150 - 400 K/uL   nRBC 0.0 0.0 - 0.2 %   Neutrophils Relative % 86 %   Neutro Abs 19.9 (H) 1.7 - 7.7 K/uL   Lymphocytes Relative 6 %   Lymphs Abs 1.5 0.7 - 4.0 K/uL   Monocytes Relative 7 %   Monocytes Absolute 1.7 (H) 0.1 - 1.0 K/uL   Eosinophils Relative 0 %   Eosinophils Absolute 0.0 0.0 - 0.5 K/uL   Basophils Relative 0 %   Basophils Absolute 0.1 0.0 - 0.1 K/uL   Immature Granulocytes 1 %   Abs Immature Granulocytes 0.20 (H) 0.00 - 0.07 K/uL    Comment: Performed at [facility], [address], [place], [place] [address]  Protime-INR     Status: None   Collection Time: 09/30/22  6:25 AM  Result Value Ref Range   Prothrombin Time 14.4 11.4 - 15.2 seconds   INR 1.1 0.8 - 1.2    Comment: (NOTE) INR goal varies  based on device and disease states. Performed at [facility], [address], [place], [place] [address]   Resp panel by RT-PCR (RSV, Flu A&B, Covid) Anterior Nasal Swab     Status: None   Collection Time: 09/30/22  6:30 AM   Specimen: Anterior Nasal Swab  Result Value Ref Range   SARS Coronavirus 2 by RT PCR NEGATIVE NEGATIVE   Influenza A by PCR NEGATIVE NEGATIVE   Influenza B by PCR NEGATIVE NEGATIVE    Comment: (NOTE) The Xpert Xpress SARS-CoV-2/FLU/RSV plus assay is intended as an aid in the diagnosis of influenza from Nasopharyngeal swab specimens and should not be used as a sole basis for treatment. Nasal washings and aspirates are unacceptable for Xpert Xpress SARS-CoV-2/FLU/RSV testing.  Fact Sheet for Patients: BloggerCourse.com  Fact Sheet for Healthcare Providers: SeriousBroker.it  This test is not yet approved or cleared by the [place] FDA and has been authorized for detection and/or diagnosis of SARS-CoV-2 by FDA under an Emergency Use Authorization (EUA). This EUA will remain in effect (meaning this test can be used) for the duration of the COVID-19 declaration under Section 564(b)(1) of the Act, 21 U.S.C. section 360bbb-3(b)(1), unless the authorization is terminated or revoked.     Resp Syncytial Virus by PCR NEGATIVE NEGATIVE    Comment: (NOTE) Fact Sheet for Patients: BloggerCourse.com  Fact Sheet for Healthcare Providers: SeriousBroker.it  This test is not yet approved or cleared by the [place] FDA and has been authorized for detection and/or diagnosis  of SARS-CoV-2 by FDA under an Emergency Use Authorization (EUA). This EUA will remain in effect (meaning this test can be used) for the duration of the COVID-19 declaration under Section 564(b)(1) of the Act, 21 U.S.C. section 360bbb-3(b)(1), unless the authorization is terminated  or revoked.  Performed at [facility], [address] [place], [place] [address]   Lactic acid, plasma     Status: Abnormal   Collection Time: 09/30/22  8:30 AM  Result Value Ref Range   Lactic Acid, Venous 5.9 (HH) 0.5 - 1.9 mmol/L    Comment: CRITICAL VALUE NOTED. VALUE IS CONSISTENT WITH PREVIOUSLY REPORTED/CALLED VALUE Performed at [facility], [address] [place], [place] [address]   Urinalysis, w/ Reflex to Culture (Infection Suspected) -Urine, Clean Catch     Status: Abnormal   Collection Time: 09/30/22  9:00 AM  Result Value Ref Range   Specimen Source URINE, CATHETERIZED    Color, Urine YELLOW YELLOW   APPearance HAZY (A) CLEAR   Specific Gravity, Urine 1.012 1.005 - 1.030   pH 6.0 5.0 - 8.0   Glucose, UA NEGATIVE NEGATIVE mg/dL   Hgb urine dipstick SMALL (A) NEGATIVE   Bilirubin Urine NEGATIVE NEGATIVE   Ketones, ur NEGATIVE NEGATIVE mg/dL   Protein, ur 30 (A) NEGATIVE mg/dL   Nitrite POSITIVE (A) NEGATIVE   Leukocytes,Ua MODERATE (A) NEGATIVE   RBC / HPF 6-10 0 - 5 RBC/hpf   WBC, UA >50 0 - 5 WBC/hpf    Comment:        Reflex urine culture not performed if WBC <=10, OR if Squamous epithelial cells >5. If Squamous epithelial cells >5 suggest recollection.    Bacteria, UA FEW (A) NONE SEEN   Squamous Epithelial / HPF 0-5 0 - 5 /HPF    Comment: Performed at [facility], [address] [place], [place] [address]  Urine Culture     Status: None (Preliminary result)   Collection Time: 09/30/22  9:00 AM   Specimen: Urine, Random  Result Value Ref Range   Specimen Description URINE, RANDOM    Special Requests      URINE, CLEAN CATCH Performed at [facility], [address] [place], [place] [address]    Culture PENDING    Report Status PENDING    CT CHEST ABDOMEN PELVIS W CONTRAST  Result Date: 09/30/2022 CLINICAL DATA:  Sepsis. EXAM: CT CHEST, ABDOMEN, AND PELVIS WITH CONTRAST TECHNIQUE: Multidetector CT imaging of the chest,  abdomen and pelvis was performed following the standard protocol during bolus administration of intravenous contrast. RADIATION DOSE REDUCTION: This exam was performed according to the departmental dose-optimization program which includes automated exposure control, adjustment of the mA and/or kV according to patient size and/or use of iterative reconstruction technique. CONTRAST:  75mL OMNIPAQUE IOHEXOL 350 MG/ML SOLN COMPARISON:  CT angio chest 08/02/2020 and CT AP from 06/23/2014 FINDINGS: CT CHEST FINDINGS Cardiovascular: Heart size is normal. No pericardial effusion. Aortic atherosclerosis and coronary artery calcification. Mediastinum/Nodes: Thyroid gland, trachea, and esophagus are unremarkable. No enlarged mediastinal or hilar lymph nodes. Lungs/Pleura: No pleural fluid, pneumothorax or interstitial edema. Patchy ground-glass attenuation is noted within the superior segment of the left lower lobe, mild patchy ground-glass attenuation is scattered within the left lower lobe. Bibasilar peripheral subpleural reticulation is noted. No airspace consolidation. Small nodule in the lateral right apex is unchanged measuring 4 mm. 3 mm right middle lobe lung nodule is also stable. These are compatible with a  benign process require no further follow-up. Musculoskeletal: No acute or suspicious osseous findings. CT ABDOMEN PELVIS FINDINGS Hepatobiliary: No focal liver abnormality is seen. Status post cholecystectomy. No biliary dilatation. Pancreas: Unremarkable. No pancreatic ductal dilatation or surrounding inflammatory changes. Spleen: Normal in size without focal abnormality. Adrenals/Urinary Tract: Normal adrenal glands. Asymmetric right-sided pelvocaliectasis with dilated extrarenal pelvis to the UPJ. This is slightly progressive compared with the previous exam. No obstructing stone identified. Normal caliber right ureter. There is mild right-sided perinephric fat stranding which is new from the previous exam.  Equivocal striated nephrograms within the right kidney noted on the delayed images. Unremarkable appearance of the left kidney with chronic left extrarenal pelvis. Tiny focus of gas noted within the non dependent portion of the bladder lumen. No focal bladder abnormality noted. Stomach/Bowel: Stomach appears normal. The appendix is not confidently identified. No secondary signs of acute appendicitis. No pathologic dilatation of large or small bowel loops. Mild stool burden is noted throughout the colon. Vascular/Lymphatic: Aortic atherosclerosis. No enlarged abdominopelvic lymph nodes. Increased multiplicity of normal size retroperitoneal lymph nodes identified. Reproductive: Uterus and bilateral adnexa are unremarkable. Other: No free fluid or fluid collections. Fat containing infraumbilical ventral abdominal wall hernias noted which measures 5.8 cm. No signs of pneumoperitoneum. Musculoskeletal: Multilevel degenerative disc disease noted throughout the lumbar spine. No acute or suspicious osseous findings. IMPRESSION: 1. Patchy ground-glass attenuation within the superior segment of the left lower lobe and left lower lobe, which may reflect mild atypical infection or inflammation. 2. Chronic, mildly progressive dilatation extrarenal to the level of the UPJ. Normal caliber right ureter. No obstructing stone identified. 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 3. Mild right-sided perinephric fat stranding with equivocal striated nephrograms within the right kidney. A small amount of gas is noted within the non dependent portion of the urinary bladder. Cannot exclude urinary tract infection. Correlation with urinalysis is recommended. 4. Fat containing infraumbilical ventral abdominal wall hernia. 5. Aortic Atherosclerosis (ICD10-I70.0). Electronically Signed   By: Signa Kell M.D.   On: 09/30/2022 10:13   DG Chest 2 View  Result Date:  09/30/2022 CLINICAL DATA:  Sepsis suspected EXAM: CHEST - 2 VIEW COMPARISON:  04/14/2022 FINDINGS: The heart size and mediastinal contours are within normal limits. Both lungs are clear. The visualized skeletal structures are unremarkable. IMPRESSION: No active cardiopulmonary disease. Electronically Signed   By: Signa Kell M.D.   On: 09/30/2022 07:02    Pending Labs Unresulted Labs (From admission, onward)     Start     Ordered   10/07/22 0500  Creatinine, serum  (enoxaparin (LOVENOX)    CrCl >/= 30 ml/min)  Weekly,   R     Comments: while on enoxaparin therapy    09/30/22 1149   10/01/22 0500  Basic metabolic panel  Tomorrow morning,   R        09/30/22 1149   10/01/22 0500  CBC  Tomorrow morning,   R        09/30/22 1149   09/30/22 1255  Hemoglobin A1c  Once,   R       Comments: To assess prior glycemic control    09/30/22 1255   09/30/22 1148  HIV Antibody (routine testing w rflx)  (HIV Antibody (Routine testing w reflex) panel)  Once,   R        09/30/22 1149   09/30/22 1148  CBC  (enoxaparin (LOVENOX)    CrCl >/= 30  ml/min)  Once,   R       Comments: Baseline for enoxaparin therapy IF NOT ALREADY DRAWN.  Notify MD if PLT < 100 K.    09/30/22 1149   09/30/22 1148  Creatinine, serum  (enoxaparin (LOVENOX)    CrCl >/= 30 ml/min)  Once,   R       Comments: Baseline for enoxaparin therapy IF NOT ALREADY DRAWN.    09/30/22 1149   09/30/22 0630  Culture, blood (Routine x 2)  BLOOD CULTURE X 2,   R (with STAT occurrences)      09/30/22 0630            Vitals/Pain Today's Vitals   09/30/22 1139 09/30/22 1201 09/30/22 1202 09/30/22 1300  BP:  (!) 143/91  (!) 121/57  Pulse:  (!) 127  (!) 111  Resp:  19  17  Temp:  99.9 F (37.7 C)    TempSrc:  Oral    SpO2:  98%  99%  Weight:      Height:      PainSc: 10-Worst pain ever  0-No pain     Isolation Precautions No active isolations  Medications Medications  cefTRIAXone (ROCEPHIN) 2 g in sodium chloride 0.9 % 100 mL  IVPB (has no administration in time range)  atorvastatin (LIPITOR) tablet 40 mg (has no administration in time range)  busPIRone (BUSPAR) tablet 7.5 mg (has no administration in time range)  pantoprazole (PROTONIX) EC tablet 40 mg (has no administration in time range)  cyclobenzaprine (FLEXERIL) tablet 5 mg (has no administration in time range)  gabapentin (NEURONTIN) capsule 400 mg (has no administration in time range)  rOPINIRole (REQUIP) tablet 0.25 mg (has no administration in time range)  topiramate (TOPAMAX) tablet 25 mg (has no administration in time range)  albuterol (PROVENTIL) (2.5 MG/3ML) 0.083% nebulizer solution 3 mL (has no administration in time range)  loratadine (CLARITIN) tablet 10 mg (has no administration in time range)  enoxaparin (LOVENOX) injection 40 mg (has no administration in time range)  acetaminophen (TYLENOL) tablet 650 mg (has no administration in time range)    Or  acetaminophen (TYLENOL) suppository 650 mg (has no administration in time range)  ondansetron (ZOFRAN) tablet 4 mg (has no administration in time range)    Or  ondansetron (ZOFRAN) injection 4 mg (has no administration in time range)  lactated ringers infusion (has no administration in time range)  insulin aspart (novoLOG) injection 0-15 Units (has no administration in time range)  lactated ringers bolus 1,000 mL (0 mLs Intravenous Stopped 09/30/22 0756)    And  lactated ringers bolus 1,000 mL (0 mLs Intravenous Stopped 09/30/22 0756)  ceFEPIme (MAXIPIME) 2 g in sodium chloride 0.9 % 100 mL IVPB (0 g Intravenous Stopped 09/30/22 0756)  azithromycin (ZITHROMAX) 500 mg in sodium chloride 0.9 % 250 mL IVPB (0 mg Intravenous Stopped 09/30/22 0925)  acetaminophen (TYLENOL) tablet 650 mg (650 mg Oral Given 09/30/22 0705)  lactated ringers bolus 1,000 mL (0 mLs Intravenous Stopped 09/30/22 1113)  iohexol (OMNIPAQUE) 350 MG/ML injection 75 mL (75 mLs Intravenous Contrast Given 09/30/22 0955)  fentaNYL (SUBLIMAZE)  injection 25 mcg (25 mcg Intravenous Given 09/30/22 1141)  ondansetron (ZOFRAN) injection 4 mg (4 mg Intravenous Given 09/30/22 1140)    Mobility manual wheelchair     Focused Assessments Cardiac Assessment Handoff:  Cardiac Rhythm: Normal sinus rhythm Lab Results  Component Value Date   CKTOTAL 37 (L) 08/20/2021   No results found for: "DDIMER" Does the Patient  currently have chest pain? No    R Recommendations: See Admitting Provider Note  Report given to:   Additional Notes:   Left sided BKA - amputated in 09/2021

## 2022-09-30 NOTE — Consult Note (Signed)
NAME:  Anna [name], MRN:  [id number], DOB:  July 18, 1955, LOS: 0 ADMISSION DATE:  09/30/2022, CONSULTATION DATE: 09/30/2022 REFERRING MD:  Trudee Grip , CHIEF COMPLAINT: Generalized weakness  History of Present Illness:  67 year old female with diabetes type 2, complicated with retinopathy and neuropathy hypertension, hyperlipidemia and recurrent UTI who was brought in the emergency department with a complaint of generalized weakness and not feeling well for last 2 days.  She stated that she was able to help herself moving to wheelchair but for last 2 days she has not been able to.  She did endorse cough but denies shortness of breath, chest pain, nausea and vomiting She complained of dysuria and urgency  Pertinent  Medical History   Past Medical History:  Diagnosis Date   Arthritis    Asthma    Back pain    Bronchitis    Bursitis    Diabetes mellitus    Diabetic neuropathy (HCC)    Diabetic retinopathy    Hyperlipemia    Hypertension    Migraine    Obesity    Sepsis due to urinary tract infection (HCC)    Stroke (HCC)    2020     Significant Hospital Events: Including procedures, antibiotic start and stop dates in addition to other pertinent events     Interim History / Subjective:  As above  Objective   Blood pressure (!) 121/57, pulse (!) 111, temperature 99.9 F (37.7 C), temperature source Oral, resp. rate 17, height 5\' 2"  (1.575 m), weight 58.1 kg, SpO2 99 %.        Intake/Output Summary (Last 24 hours) at 09/30/2022 1326 Last data filed at 09/30/2022 1113 Gross per 24 hour  Intake 8.49 ml  Output 800 ml  Net -791.51 ml   Filed Weights   09/30/22 0755  Weight: 58.1 kg    Examination: General: Acute on chronically ill-appearing female, lying on the bed HEENT: [place]/AT, eyes anicteric.  Dry mucus membranes Neuro: Alert, awake following commands Chest: Coarse breath sounds, no wheezes or rhonchi Heart: Regular rate and rhythm, no murmurs or  gallops Abdomen: Soft, nontender, nondistended, bowel sounds present Skin: No rash  Labs and images were reviewed Resolved Hospital Problem list     Assessment & Plan:  Sepsis due to acute pyelonephritis, POA Diabetes type 2 Hypertension Hyperlipidemia Hyponatremia Acute kidney injury due to dehydration  Patient is still clinically looks dry, she does meet clinically for sepsis with acute organ injury including AKI UA suggestive of UTI, CT scan abdomen pelvis showed probable right-sided pyelonephritis Continue IV antibiotics with ceftriaxone Follow-up urine culture Continue IV fluid hydration Monitor fingerstick goal 140-180 Patient blood pressure is borderline at this time, hold off on antihypertensive meds She is off vasopressors Monitor intake and output Avoid nephrotoxic agent Monitor serum sodium  As patient is off vasopressors, she looks stable to be admitted to hospitalist.  At this time she does not require ICU level of care PCCM will sign off, please call with questions  Best Practice (right click and "Reselect all SmartList Selections" daily)   Per primary team  Labs   CBC: Recent Labs  Lab 09/30/22 0625  WBC 23.4*  NEUTROABS 19.9*  HGB 11.2*  HCT 35.4*  MCV 92.7  PLT 336    Basic Metabolic Panel: Recent Labs  Lab 09/30/22 0625  NA 131*  K 4.0  CL 99  CO2 19*  GLUCOSE 239*  BUN 19  CREATININE 1.18*  CALCIUM 8.7*   GFR: Estimated  Creatinine Clearance: 36.6 mL/min (A) (by C-G formula based on SCr of 1.18 mg/dL (H)). Recent Labs  Lab 09/30/22 0625 09/30/22 0830  WBC 23.4*  --   LATICACIDVEN 2.6* 5.9*    Liver Function Tests: Recent Labs  Lab 09/30/22 0625  AST 24  ALT 22  ALKPHOS 76  BILITOT 0.9  PROT 7.0  ALBUMIN 3.3*   No results for input(s): "LIPASE", "AMYLASE" in the last 168 hours. No results for input(s): "AMMONIA" in the last 168 hours.  ABG No results found for: "PHART", "PCO2ART", "PO2ART", "HCO3", "TCO2",  "ACIDBASEDEF", "O2SAT"   Coagulation Profile: Recent Labs  Lab 09/30/22 0625  INR 1.1    Cardiac Enzymes: No results for input(s): "CKTOTAL", "CKMB", "CKMBINDEX", "TROPONINI" in the last 168 hours.  HbA1C: Hgb A1c MFr Bld  Date/Time Value Ref Range Status  08/20/2021 01:19 AM 8.2 (H) 4.8 - 5.6 % Final    Comment:    (NOTE) Pre diabetes:          5.7%-6.4%  Diabetes:              >6.4%  Glycemic control for   <7.0% adults with diabetes     CBG: No results for input(s): "GLUCAP" in the last 168 hours.  Review of Systems:   12 point review of system is significant for complaint mentioned HPI, rest is negative  Past Medical History:  She,  has a past medical history of Arthritis, Asthma, Back pain, Bronchitis, Bursitis, Diabetes mellitus, Diabetic neuropathy (HCC), Diabetic retinopathy, Hyperlipemia, Hypertension, Migraine, Obesity, and Sepsis due to urinary tract infection (HCC).   Surgical History:   Past Surgical History:  Procedure Laterality Date   ABDOMINAL AORTOGRAM W/LOWER EXTREMITY Left 08/22/2021   Procedure: ABDOMINAL AORTOGRAM W/LOWER EXTREMITY;  Surgeon: Nada Libman, MD;  Location: [facility];  Service: Cardiovascular;  Laterality: Left;   AMPUTATION Left 10/11/2021   Procedure: LEFT BELOW KNEE AMPUTATION;  Surgeon: Nadara Mustard, MD;  Location[facility];  Service: Orthopedics;  Laterality: Left;   CESAREAN SECTION     x3   CHOLECYSTECTOMY     EYE SURGERY Left    METATARSAL HEAD EXCISION Left 08/24/2021   Procedure: METATARSAL HEAD EXCISION FIFTH TOE;  Surgeon: Vivi Barrack, DPM;  Location: [facility] OR;  Service: Podiatry;  Laterality: Left;   PERIPHERAL VASCULAR BALLOON ANGIOPLASTY  08/22/2021   Procedure: PERIPHERAL VASCULAR BALLOON ANGIOPLASTY;  Surgeon: Nada Libman, MD;  Location: [facility];  Service: Cardiovascular;;  Left AT   WOUND DEBRIDEMENT Left 08/24/2021   Procedure: DEBRIDEMENT WOUND OF LEFT FOOT;  Surgeon: Vivi Barrack, DPM;  Location: [facility] OR;  Service: Podiatry;  Laterality: Left;     Social History:   reports that she has never smoked. She has never used smokeless tobacco. She reports that she does not drink alcohol and does not use drugs.   Family History:  Her family history is not on file.   Allergies Allergies  Allergen Reactions   Bacitracin     Other reaction(s): Other Burns skin     Citalopram Diarrhea   Doxycycline Other (See Comments)    Other reaction(s): Other "burning" all over body     Morphine And Codeine Hives   Neomycin     Unknown reaction Not listed on the MAR    Ciprofloxacin     Tongue swells   Ciprofloxacin-Dexamethasone Other (See Comments)    unknown    Duloxetine     Other reaction(s):  Mental Status Changes (intolerance) "saw pictures of gun"   Latex Itching   Levaquin [Levofloxacin In D5w] Other (See Comments)    Pt does not remember reaction but states she's had issues with med   Piper     Other reaction(s): Other (See Comments) White pepper- uncontrollable sneezing    Salvia Officinalis     Sneezing Not listed on the [facility]   Soma [Carisoprodol]     Sleepy and constipation  Not listed on the [facility]   Lasix [Furosemide] Rash     Home Medications  Prior to Admission medications   Medication Sig Start Date End Date Taking? Authorizing Provider  acetaminophen (TYLENOL) 500 MG tablet Take 1,000 mg by mouth 2 (two) times daily as needed for moderate pain.   Yes [provider]  albuterol (VENTOLIN HFA) 108 (90 Base) MCG/ACT inhaler Inhale 1-2 puffs into the lungs every 6 (six) hours as needed for wheezing or shortness of breath.   Yes [provider]  aspirin EC 81 MG tablet Take 81 mg by mouth daily. Swallow whole.   Yes [provider]  atorvastatin (LIPITOR) 40 MG tablet Take 40 mg by mouth daily.   Yes [provider]  busPIRone (BUSPAR) 7.5 MG tablet Take 7.5 mg by mouth 2 (two) times daily.   Yes [provider]  calamine lotion Apply 1 Application topically 3 (three) times daily as needed for itching (to insect bites on right breast and right upper back).   Yes [provider]  clopidogrel (PLAVIX) 75 MG tablet Take 75 mg by mouth daily.   Yes [provider]  cyclobenzaprine (FLEXERIL) 5 MG tablet Take 5 mg by mouth in the morning and at bedtime.   Yes [provider]  docusate sodium (COLACE) 100 MG capsule Take 100 mg by mouth 2 (two) times daily as needed for mild constipation.   Yes [provider]  Dulaglutide 3 MG/0.5ML SOPN Inject 3 mg into the skin once a week. Saturday   Yes [provider]  estradiol (ESTRACE) 0.1 MG/GM vaginal cream Place 1 Applicatorful vaginally as needed (weekly as needed for dryness).   Yes [provider]  fluticasone (FLONASE) 50 MCG/ACT nasal spray Place 1 spray into both nostrils daily as needed for allergies or rhinitis.   Yes [provider]  gabapentin (NEURONTIN) 400 MG capsule Take 400 mg by mouth 3 (three) times daily.   Yes [provider]  hydrOXYzine (ATARAX) 10 MG tablet Take 1 tablet (10 mg total) by mouth 2 (two) times daily. Patient taking differently: Take 10 mg by mouth 2 (two) times daily as needed for itching. 10/13/21  Yes Rhetta Mura, MD  insulin glargine (LANTUS) 100 unit/mL SOPN Inject 25 Units into the skin at bedtime.   Yes [provider]  Lactobacillus Rhamnosus, GG, (CULTURELLE HEALTH & WELLNESS) CAPS Take 1 capsule by mouth daily. 08/05/21  Yes [provider]  lidocaine 4 % Place 1 patch onto the skin daily as needed (pain).   Yes [provider]  lisinopril (ZESTRIL) 5 MG tablet Take 5 mg by mouth daily.   Yes [provider]  loratadine (CLARITIN) 10 MG tablet Take 10 mg by mouth daily as needed for allergies.   Yes [provider]  meclizine (ANTIVERT) 12.5 MG tablet Take 12.5 mg by mouth daily as needed  for dizziness. 10/01/19  Yes [provider]  Menthol, Topical Analgesic, (BIOFREEZE) 4 % GEL Apply 1 application  topically 4 (  four) times daily as needed (to right side back, neck, shoulder and hip for pain).   Yes [provider]  metFORMIN (GLUCOPHAGE) 1000 MG tablet Take 1,000 mg by mouth 2 (two) times daily with a meal.   Yes [provider]  Multiple Vitamin (MULTIVITAMIN WITH MINERALS) TABS tablet Take 1 tablet by mouth daily.   Yes [provider]  naloxone (NARCAN) nasal spray 4 mg/0.1 mL Place 1 spray into the nose once as needed (for opiod overdose).   Yes [provider]  nystatin cream (MYCOSTATIN) Apply 1 application. topically 4 (four) times daily as needed for dry skin.   Yes [provider]  ondansetron (ZOFRAN) 4 MG tablet Take 4 mg by mouth every 8 (eight) hours as needed for nausea or vomiting.   Yes [provider]  OVER THE COUNTER MEDICATION Take 1 tablet by mouth daily as needed (concentration).   Yes [provider]  pantoprazole (PROTONIX) 40 MG tablet Take 40 mg by mouth daily.   Yes [provider]  polyethylene glycol (MIRALAX / GLYCOLAX) 17 g packet Take 17 g by mouth daily as needed for moderate constipation.   Yes [provider]  rOPINIRole (REQUIP) 0.25 MG tablet Take 0.25 mg by mouth at bedtime.   Yes [provider]  sucralfate (CARAFATE) 1 GM/10ML suspension Take 1 g by mouth 4 (four) times daily as needed (stomch ulcer).   Yes [provider]  topiramate (TOPAMAX) 25 MG tablet Take 1 tablet (25 mg total) by mouth daily. 10/13/21  Yes Rhetta Mura, MD      Cheri Fowler, MD [facility] See Amion for pager If no response to pager, please call [phone] until 7pm After 7pm, Please call E-link [phone]

## 2022-09-30 NOTE — ED Triage Notes (Signed)
Pt arrived from [place] via [facility] c/o Hypotension and fever that began yesterday.

## 2022-09-30 NOTE — Progress Notes (Signed)
ED Pharmacy Antibiotic Sign Off An antibiotic consult was received from an ED provider for cefepime and vancomycin per pharmacy dosing for PNA. A chart review was completed to assess appropriateness.   The following one time order(s) were placed:  Vancomycin 1250mg  IV x1 Cefepime 2g IV x1  Further antibiotic and/or antibiotic pharmacy consults should be ordered by the admitting provider if indicated.   Thank you for allowing pharmacy to be a part of this patient's care.   Vernard Gambles, PharmD, BCPS   Clinical Pharmacist 09/30/22 6:59 AM

## 2022-09-30 NOTE — Op Note (Addendum)
Preoperative diagnosis:  Right UPJO with urinary sepsis    Postoperative diagnosis:  same   Procedure:  Cystoscopy right ureteral stent placement right retrograde pyelography with interpretation   Surgeon:Eugene Bell, III, MD  Anesthesia: General  Complications: None  Intraoperative findings:   -bladder with significant irritation and erythema as well as white debris. No masses or lesions on this evaluation.  -right retrograde pyelography demonstrated dilation of the right kidney to the level you of the UPJ. small kink a the proximal UPJ able to easily negotiate wire. -right proximal culture sent. Urine clear and not concerning for infection -foley catheter placed given c/f bladder debris and recent fever  EBL: Minimal  Specimens: None  Indication: ELEKTRA MAUTHE is a 67 y.o. 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 After reviewing the management options for treatment, he elected to proceed with the above surgical procedure(s). We have discussed the potential benefits and risks of the procedure, side effects of the proposed treatment, the likelihood of the patient achieving the goals of the procedure, and any potential problems that might occur during the procedure or recuperation. Informed consent has been obtained.  Description of procedure:  The patient was taken to the operating room and general anesthesia was induced.  The patient was placed in the dorsal lithotomy position, prepped and draped in the usual sterile fashion, and preoperative antibiotics were administered. A preoperative time-out was performed.   Cystourethroscopy was performed which demonstrated the findings above. Attention then turned to the rightureteral orifice and a ureteral catheter was used to intubate the ureteral orifice.  Omnipaque contrast was injected through the ureteral catheter and a retrograde pyelogram  was performed with findings as dictated above.  A 0.38 sensor guidewire was then advanced up the right ureter into the renal pelvis under fluoroscopic guidance.  5 Fr open ended catheter was advanced to the right UPJ under fluoroscopic guidance. The wire was removed. A proximal culture was obtained. The wire was then replaced and confirmed to be in the renal pelvis.  A 6Fr x 24 JJ ureteral stent was advance over the wire using Seldinger technique.  The stent was positioned appropriately under fluoroscopic and cystoscopic guidance.  The wire was then removed with an adequate stent curl noted in the renal pelvis as well as in the bladder. The cystoscope was removed. The bladder was kept full.  A 16 Fr foley catheter was placed given debris and bladder and recent fever. 10 cc were placed in the foley balloon.   The patient appeared to tolerate the procedure well and without complications.  The patient was able to be awakened and transferred to the recovery unit in satisfactory condition.   Plan: Continue ureteral stent Continue foley until fever free for 24 hours Follow up cultures, continue BSA Rest of care per primary team For bladder spasm, ok to treat with vesicare vs ditropan   Jerald Kief, MD, PhD [facility] Resident  [facility]

## 2022-09-30 NOTE — Assessment & Plan Note (Addendum)
Right pyelonephritis.  Patient with severe sepsis, with hypotension.  Now is off vasopressors after aggressive IV fluids resuscitation 3 L LR.  Plan to admit to progressive care unit.  continue antibiotic therapy with broad spectrum antibiotic therapy with IV ceftriaxone. Continue lactated ringers at 100 ml per hr Source control with Urology intervention, plan for stent to the right.  Follow up on cell count, cultures and temperature curve Will have a low threshold to resume vasopressor therapy.

## 2022-09-30 NOTE — Anesthesia Procedure Notes (Signed)
Procedure Name: Intubation Date/Time: 09/30/2022 2:26 PM  Performed by: Carlos American, CRNAPre-anesthesia Checklist: Patient identified, Emergency Drugs available, Suction available and Patient being monitored Patient Re-evaluated:Patient Re-evaluated prior to induction Oxygen Delivery Method: Circle System Utilized Preoxygenation: Pre-oxygenation with 100% oxygen Induction Type: IV induction, Rapid sequence and Cricoid Pressure applied Laryngoscope Size: Mac and 3 Grade View: Grade I Tube type: Oral Tube size: 7.0 mm Number of attempts: 1 Airway Equipment and Method: Stylet Placement Confirmation: ETT inserted through vocal cords under direct vision, positive ETCO2 and breath sounds checked- equal and bilateral Secured at: 21 cm Tube secured with: Tape Dental Injury: Teeth and Oropharynx as per pre-operative assessment

## 2022-09-30 NOTE — ED Provider Notes (Cosign Needed Addendum)
[place] EMERGENCY DEPARTMENT AT [facility] Provider Note   CSN: [id number] Arrival date & time: 09/30/22  1308     History  Chief Complaint  Patient presents with   Hypotension   Fever    Anna [name] is a 67 y.o. female.  HPI 67 year old female with a history of hyperlipidemia, DM type II, arthritis, diabetic retinopathy and neuropathy, asthma, sepsis due to UTI presents to the ER with complaints of weakness and fatigue for the last 2 days.  She states that normally she is able to help with the skin to her wheelchair but the last few days she has not been able to do much for herself.  She does endorse a productive cough.  Denies any dysuria though does states she has a history of UTIs.  She states that she does have issues with chronic constipation and has been having some diffuse abdominal pain as well.  She took a laxative yesterday and has not had a bowel movement.  [facility] resides at [facility].  No known sick contacts.  Denies any chest pain or shortness of breath.  No nausea or vomiting or diarrhea.    Home Medications Prior to Admission medications   Medication Sig Start Date End Date Taking? Authorizing Provider  acetaminophen (TYLENOL) 500 MG tablet Take 1,000 mg by mouth 2 (two) times daily as needed for moderate pain.   Yes [provider]  albuterol (VENTOLIN HFA) 108 (90 Base) MCG/ACT inhaler Inhale 1-2 puffs into the lungs every 6 (six) hours as needed for wheezing or shortness of breath.   Yes [provider]  aspirin EC 81 MG tablet Take 81 mg by mouth daily. Swallow whole.   Yes [provider]  atorvastatin (LIPITOR) 40 MG tablet Take 40 mg by mouth daily.   Yes [provider]  busPIRone (BUSPAR) 7.5 MG tablet Take 7.5 mg by mouth 2 (two) times daily.   Yes [provider]  calamine lotion Apply 1 Application topically 3 (three) times daily as needed for itching (to insect bites on right breast and  right upper back).   Yes [provider]  clopidogrel (PLAVIX) 75 MG tablet Take 75 mg by mouth daily.   Yes [provider]  cyclobenzaprine (FLEXERIL) 5 MG tablet Take 5 mg by mouth in the morning and at bedtime.   Yes [provider]  docusate sodium (COLACE) 100 MG capsule Take 100 mg by mouth 2 (two) times daily as needed for mild constipation.   Yes [provider]  Dulaglutide 3 MG/0.5ML SOPN Inject 3 mg into the skin once a week. Saturday   Yes [provider]  estradiol (ESTRACE) 0.1 MG/GM vaginal cream Place 1 Applicatorful vaginally as needed (weekly as needed for dryness).   Yes [provider]  fluticasone (FLONASE) 50 MCG/ACT nasal spray Place 1 spray into both nostrils daily as needed for allergies or rhinitis.   Yes [provider]  gabapentin (NEURONTIN) 400 MG capsule Take 400 mg by mouth 3 (three) times daily.   Yes [provider]  hydrOXYzine (ATARAX) 10 MG tablet Take 1 tablet (10 mg total) by mouth 2 (two) times daily. Patient taking differently: Take 10 mg by mouth 2 (two) times daily as needed for itching. 10/13/21  Yes Rhetta Mura, MD  insulin glargine (LANTUS) 100 unit/mL SOPN Inject 25 Units into the skin at bedtime.   Yes [provider]  Lactobacillus Rhamnosus, GG, (CULTURELLE HEALTH & WELLNESS)  CAPS Take 1 capsule by mouth daily. 08/05/21  Yes [provider]  lidocaine 4 % Place 1 patch onto the skin daily as needed (pain).   Yes [provider]  lisinopril (ZESTRIL) 5 MG tablet Take 5 mg by mouth daily.   Yes [provider]  loratadine (CLARITIN) 10 MG tablet Take 10 mg by mouth daily as needed for allergies.   Yes [provider]  meclizine (ANTIVERT) 12.5 MG tablet Take 12.5 mg by mouth daily as needed for dizziness. 10/01/19  Yes [provider]  Menthol, Topical Analgesic, (BIOFREEZE) 4 % GEL Apply 1 application  topically 4 (four)  times daily as needed (to right side back, neck, shoulder and hip for pain).   Yes [provider]  metFORMIN (GLUCOPHAGE) 1000 MG tablet Take 1,000 mg by mouth 2 (two) times daily with a meal.   Yes [provider]  Multiple Vitamin (MULTIVITAMIN WITH MINERALS) TABS tablet Take 1 tablet by mouth daily.   Yes [provider]  naloxone (NARCAN) nasal spray 4 mg/0.1 mL Place 1 spray into the nose once as needed (for opiod overdose).   Yes [provider]  nystatin cream (MYCOSTATIN) Apply 1 application. topically 4 (four) times daily as needed for dry skin.   Yes [provider]  ondansetron (ZOFRAN) 4 MG tablet Take 4 mg by mouth every 8 (eight) hours as needed for nausea or vomiting.   Yes [provider]  OVER THE COUNTER MEDICATION Take 1 tablet by mouth daily as needed (concentration).   Yes [provider]  pantoprazole (PROTONIX) 40 MG tablet Take 40 mg by mouth daily.   Yes [provider]  polyethylene glycol (MIRALAX / GLYCOLAX) 17 g packet Take 17 g by mouth daily as needed for moderate constipation.   Yes [provider]  rOPINIRole (REQUIP) 0.25 MG tablet Take 0.25 mg by mouth at bedtime.   Yes [provider]  sucralfate (CARAFATE) 1 GM/10ML suspension Take 1 g by mouth 4 (four) times daily as needed (stomch ulcer).   Yes [provider]  topiramate (TOPAMAX) 25 MG tablet Take 1 tablet (25 mg total) by mouth daily. 10/13/21  Yes Rhetta Mura, MD      Allergies    Bacitracin, Citalopram, Doxycycline, Morphine and codeine, Neomycin, Ciprofloxacin, Ciprofloxacin-dexamethasone, Duloxetine, Latex, Levaquin [levofloxacin in d5w], Piper, Salvia officinalis, Soma [carisoprodol], and Lasix [furosemide]    Review of Systems   Review of Systems Ten systems reviewed and are negative for acute change, except as noted in the HPI.   Physical Exam Updated Vital Signs BP 132/60   Pulse 82    Temp 98 F (36.7 C) (Oral)   Resp 16   Ht 5\' 2"  (1.575 m)   Wt 58.1 kg   SpO2 97%   BMI 23.41 kg/m  Physical Exam Vitals and nursing note reviewed.  Constitutional:      General: She is not in acute distress.    Appearance: She is well-developed.  HENT:     Head: Normocephalic and atraumatic.  Eyes:     Conjunctiva/sclera: Conjunctivae normal.  Cardiovascular:     Rate and Rhythm: Normal rate and regular rhythm.     Heart sounds: No murmur heard. Pulmonary:     Effort: Pulmonary effort is normal. No respiratory distress.     Breath sounds: Normal breath sounds.  Abdominal:     General: There is distension.     Palpations: Abdomen is soft.  Tenderness: There is abdominal tenderness.     Comments: Abdomen distended, soft, diffusely tender  Musculoskeletal:        General: No swelling.     Cervical back: Neck supple.     Comments: L BKA   Skin:    General: Skin is warm and dry.     Capillary Refill: Capillary refill takes less than 2 seconds.  Neurological:     General: No focal deficit present.     Mental Status: She is alert and oriented to person, place, and time.  Psychiatric:        Mood and Affect: Mood normal.     ED Results / Procedures / Treatments   Labs (all labs ordered are listed, but only abnormal results are displayed) Labs Reviewed  COMPREHENSIVE METABOLIC PANEL - Abnormal; Notable for the following components:      Result Value   Sodium 131 (*)    CO2 19 (*)    Glucose, Bld 239 (*)    Creatinine, Ser 1.18 (*)    Calcium 8.7 (*)    Albumin 3.3 (*)    GFR, Estimated 51 (*)    All other components within normal limits  LACTIC ACID, PLASMA - Abnormal; Notable for the following components:   Lactic Acid, Venous 2.6 (*)    All other components within normal limits  LACTIC ACID, PLASMA - Abnormal; Notable for the following components:   Lactic Acid, Venous 5.9 (*)    All other components within normal limits  CBC WITH DIFFERENTIAL/PLATELET -  Abnormal; Notable for the following components:   WBC 23.4 (*)    RBC 3.82 (*)    Hemoglobin 11.2 (*)    HCT 35.4 (*)    Neutro Abs 19.9 (*)    Monocytes Absolute 1.7 (*)    Abs Immature Granulocytes 0.20 (*)    All other components within normal limits  URINALYSIS, W/ REFLEX TO CULTURE (INFECTION SUSPECTED) - Abnormal; Notable for the following components:   APPearance HAZY (*)    Hgb urine dipstick SMALL (*)    Protein, ur 30 (*)    Nitrite POSITIVE (*)    Leukocytes,Ua MODERATE (*)    Bacteria, UA FEW (*)    All other components within normal limits  RESP PANEL BY RT-PCR (RSV, FLU A&B, COVID)  RVPGX2  CULTURE, BLOOD (ROUTINE X 2)  CULTURE, BLOOD (ROUTINE X 2)  URINE CULTURE  PROTIME-INR    EKG EKG Interpretation Date/Time:  Sunday September 30 2022 07:14:33 EDT Ventricular Rate:  90 PR Interval:  135 QRS Duration:  91 QT Interval:  355 QTC Calculation: 435 R Axis:   73  Text Interpretation: Sinus rhythm Low voltage, precordial leads No significant change since last tracing Confirmed by Linwood Dibbles [phone]) on 09/30/2022 8:29:15 AM  Radiology CT CHEST ABDOMEN PELVIS W CONTRAST  Result Date: 09/30/2022 CLINICAL DATA:  Sepsis. EXAM: CT CHEST, ABDOMEN, AND PELVIS WITH CONTRAST TECHNIQUE: Multidetector CT imaging of the chest, abdomen and pelvis was performed following the standard protocol during bolus administration of intravenous contrast. RADIATION DOSE REDUCTION: This exam was performed according to the departmental dose-optimization program which includes automated exposure control, adjustment of the mA and/or kV according to patient size and/or use of iterative reconstruction technique. CONTRAST:  75mL OMNIPAQUE IOHEXOL 350 MG/ML SOLN COMPARISON:  CT angio chest 08/02/2020 and CT AP from 06/23/2014 FINDINGS: CT CHEST FINDINGS Cardiovascular: Heart size is normal. No pericardial effusion. Aortic atherosclerosis and coronary artery calcification. Mediastinum/Nodes: Thyroid gland,  trachea,  and esophagus are unremarkable. No enlarged mediastinal or hilar lymph nodes. Lungs/Pleura: No pleural fluid, pneumothorax or interstitial edema. Patchy ground-glass attenuation is noted within the superior segment of the left lower lobe, mild patchy ground-glass attenuation is scattered within the left lower lobe. Bibasilar peripheral subpleural reticulation is noted. No airspace consolidation. Small nodule in the lateral right apex is unchanged measuring 4 mm. 3 mm right middle lobe lung nodule is also stable. These are compatible with a benign process require no further follow-up. Musculoskeletal: No acute or suspicious osseous findings. CT ABDOMEN PELVIS FINDINGS Hepatobiliary: No focal liver abnormality is seen. Status post cholecystectomy. No biliary dilatation. Pancreas: Unremarkable. No pancreatic ductal dilatation or surrounding inflammatory changes. Spleen: Normal in size without focal abnormality. Adrenals/Urinary Tract: Normal adrenal glands. Asymmetric right-sided pelvocaliectasis with dilated extrarenal pelvis to the UPJ. This is slightly progressive compared with the previous exam. No obstructing stone identified. Normal caliber right ureter. There is mild right-sided perinephric fat stranding which is new from the previous exam. Equivocal striated nephrograms within the right kidney noted on the delayed images. Unremarkable appearance of the left kidney with chronic left extrarenal pelvis. Tiny focus of gas noted within the non dependent portion of the bladder lumen. No focal bladder abnormality noted. Stomach/Bowel: Stomach appears normal. The appendix is not confidently identified. No secondary signs of acute appendicitis. No pathologic dilatation of large or small bowel loops. Mild stool burden is noted throughout the colon. Vascular/Lymphatic: Aortic atherosclerosis. No enlarged abdominopelvic lymph nodes. Increased multiplicity of normal size retroperitoneal lymph nodes identified.  Reproductive: Uterus and bilateral adnexa are unremarkable. Other: No free fluid or fluid collections. Fat containing infraumbilical ventral abdominal wall hernias noted which measures 5.8 cm. No signs of pneumoperitoneum. Musculoskeletal: Multilevel degenerative disc disease noted throughout the lumbar spine. No acute or suspicious osseous findings. IMPRESSION: 1. Patchy ground-glass attenuation within the superior segment of the left lower lobe and left lower lobe, which may reflect mild atypical infection or inflammation. 2. Chronic, mildly progressive dilatation extrarenal to the level of the UPJ. Normal caliber right ureter. No obstructing stone identified. 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 3. Mild right-sided perinephric fat stranding with equivocal striated nephrograms within the right kidney. A small amount of gas is noted within the non dependent portion of the urinary bladder. Cannot exclude urinary tract infection. Correlation with urinalysis is recommended. 4. Fat containing infraumbilical ventral abdominal wall hernia. 5. Aortic Atherosclerosis (ICD10-I70.0). Electronically Signed   By: Signa Kell M.D.   On: 09/30/2022 10:13   DG Chest 2 View  Result Date: 09/30/2022 CLINICAL DATA:  Sepsis suspected EXAM: CHEST - 2 VIEW COMPARISON:  04/14/2022 FINDINGS: The heart size and mediastinal contours are within normal limits. Both lungs are clear. The visualized skeletal structures are unremarkable. IMPRESSION: No active cardiopulmonary disease. Electronically Signed   By: Signa Kell M.D.   On: 09/30/2022 07:02    Procedures .Critical Care  Performed by: Mare Ferrari, PA-C Authorized by: Mare Ferrari, PA-C   Critical care provider statement:    Critical care time (minutes):  30   Critical care was necessary to treat or prevent imminent or life-threatening deterioration of the following conditions:  Sepsis and  circulatory failure   Critical care was time spent personally by me on the following activities:  Development of treatment plan with patient or surrogate, discussions with consultants, evaluation of patient's response to treatment, examination of patient, ordering and review of laboratory  studies, ordering and review of radiographic studies, ordering and performing treatments and interventions, pulse oximetry, re-evaluation of patient's condition and review of old charts     Medications Ordered in ED Medications  vancomycin (VANCOREADY) IVPB 1250 mg/250 mL (has no administration in time range)  lactated ringers infusion ( Intravenous New Bag/Given 09/30/22 0928)  norepinephrine (LEVOPHED) 4mg  in (0.016 mg/mL) premix infusion (2 mcg/min Intravenous Infusion Verify 09/30/22 1028)  lactated ringers bolus 1,000 mL (0 mLs Intravenous Stopped 09/30/22 0756)    And  lactated ringers bolus 1,000 mL (0 mLs Intravenous Stopped 09/30/22 0756)  ceFEPIme (MAXIPIME) 2 g in sodium chloride 0.9 % 100 mL IVPB (0 g Intravenous Stopped 09/30/22 0756)  azithromycin (ZITHROMAX) 500 mg in sodium chloride 0.9 % 250 mL IVPB (0 mg Intravenous Stopped 09/30/22 0925)  acetaminophen (TYLENOL) tablet 650 mg (650 mg Oral Given 09/30/22 0705)  lactated ringers bolus 1,000 mL (1,000 mLs Intravenous New Bag/Given 09/30/22 0929)  iohexol (OMNIPAQUE) 350 MG/ML injection 75 mL (75 mLs Intravenous Contrast Given 09/30/22 0955)    ED Course/ Medical Decision Making/ A&P                             Medical Decision Making Amount and/or Complexity of Data Reviewed Labs: ordered. Radiology: ordered. ECG/medicine tests: ordered.  Risk OTC drugs. Prescription drug management. Decision regarding hospitalization.  67 year old female who presents to the ER with complaints of weakness x 2 days.  On arrival blood pressures were soft at 95/40, 5 feet temperature of 100.5, mildly tachycardic at 102, not tachypneic or hypoxic.  Physical exam  reveals slightly distended and mildly diffusely tender abdomen.  She also endorses cough with productive sputum.  Concern for sepsis of unknown etiology, differential includes pneumonia, diverticulitis, appendicitis, UTI, colitis, SBO  Labs ordered, reviewed and interpreted by me.  CBC with leukocytosis of 23.4, with a left shift, hemoglobin 11.2, stable.  Actiq acid 2.6.  CMP with mild hyponatremia 131, creatinine 1.1, elevated from baseline  Chest x-ray reviewed, agree with radiology read, negative for acute findings.  EKG reviewed, normal sinus rhythm.  Patient received her 30 cc of fluid, and was persistently hypotensive.  Given additional liter of LR.  She was started on broad-spectrum antibiotics with cefepime, vancomycin and azithromycin for possible pneumonia/UTI.  Obtained a CT of the chest abdomen pelvis to better characterize source, findings as below, agree with radiology read  IMPRESSION:  1. Patchy ground-glass attenuation within the superior segment of  the left lower lobe and left lower lobe, which may reflect mild  atypical infection or inflammation.  2. Chronic, mildly progressive dilatation extrarenal to the level of  the UPJ. Normal caliber right ureter. No obstructing stone  identified. 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  3. Mild right-sided perinephric fat stranding with equivocal  striated nephrograms within the right kidney. A small amount of gas  is noted within the non dependent portion of the urinary bladder.  Cannot exclude urinary tract infection. Correlation with urinalysis  is recommended.  4. Fat containing infraumbilical ventral abdominal wall hernia.   Patient persistently hypotensive with worsening lactic acidosis to 5.6.  Consulted urology, who will see and evaluate the patient for possible chronic UPJ obstruction.  Consulted intensivist team.  10:48 AM: Critical care will admit.  I  spoke with the patient's son Gabriel Rung and updated him on her admission.  11:27AM: Pt weaned  off Levo. Critical care evaluated, ok or hospitalist service. Urology evaluated, plan for stent today. Consulted hospitalist  for admission    Final Clinical Impression(s) / ED Diagnoses Final diagnoses:  Septic shock [facility])    Rx / DC Orders ED Discharge Orders     None           Leone Brand 09/30/22 1557    Linwood Dibbles, MD 10/03/22 [phone]

## 2022-09-30 NOTE — Anesthesia Preprocedure Evaluation (Addendum)
Anesthesia Evaluation  Patient identified by MRN, date of birth, ID band Patient awake    Reviewed: Allergy & Precautions, NPO status , Patient's Chart, lab work & pertinent test results  Airway Mallampati: III  TM Distance: >3 FB Neck ROM: Full    Dental  (+) Edentulous Lower, Edentulous Upper, Dental Advisory Given   Pulmonary asthma , sleep apnea and Continuous Positive Airway Pressure Ventilation , COPD   Pulmonary exam normal breath sounds clear to auscultation       Cardiovascular hypertension, Pt. on medications and Pt. on home beta blockers + Peripheral Vascular Disease  + Valvular Problems/Murmurs (Mod TR) MR  Rhythm:Regular Rate:Tachycardia  Echho 08/2021  1. Left ventricular ejection fraction, by estimation, is 60 to 65%. The left ventricle has normal function. The left ventricle has no regional wall motion abnormalities. Left ventricular diastolic parameters are consistent with Grade I diastolic dysfunction (impaired relaxation).   2. Right ventricular systolic function is normal. The right ventricular size is normal.   3. The mitral valve is degenerative. Mild to moderate mitral valve regurgitation.   4. Tricuspid valve regurgitation is moderate.   5. The aortic valve is normal in structure. Aortic valve regurgitation is mild.   6. Aortic no significant ascending aortic aneurysm.   7. The inferior vena cava is normal in size with greater than 50% respiratory variability, suggesting right atrial pressure of 3 mmHg.     Neuro/Psych  Headaches PSYCHIATRIC DISORDERS Anxiety     CVA    GI/Hepatic Neg liver ROS,GERD  ,,  Endo/Other  diabetes, Type 2, Insulin Dependent    Renal/GU Renal disease     Musculoskeletal  (+) Arthritis ,    Abdominal  (+) + obese  Peds  Hematology negative hematology ROS (+)   Anesthesia Other Findings   Reproductive/Obstetrics                              Anesthesia Physical Anesthesia Plan  ASA: 3 and emergent  Anesthesia Plan: General   Post-op Pain Management: Ofirmev IV (intra-op)*   Induction: Intravenous, Rapid sequence and Cricoid pressure planned  PONV Risk Score and Plan: 4 or greater and Ondansetron, Dexamethasone, Midazolam and Treatment may vary due to age or medical condition  Airway Management Planned: Oral ETT  Additional Equipment:   Intra-op Plan:   Post-operative Plan: Extubation in OR  Informed Consent: I have reviewed the patients History and Physical, chart, labs and discussed the procedure including the risks, benefits and alternatives for the proposed anesthesia with the patient or authorized representative who has indicated his/her understanding and acceptance.     Dental advisory given  Plan Discussed with: CRNA  Anesthesia Plan Comments:         Anesthesia Quick Evaluation

## 2022-09-30 NOTE — ED Notes (Signed)
Pt had linen changed and urine sample sent.

## 2022-10-01 ENCOUNTER — Encounter (HOSPITAL_COMMUNITY): Payer: Self-pay | Admitting: Urology

## 2022-10-01 DIAGNOSIS — A419 Sepsis, unspecified organism: Secondary | ICD-10-CM | POA: Diagnosis not present

## 2022-10-01 DIAGNOSIS — N390 Urinary tract infection, site not specified: Secondary | ICD-10-CM | POA: Diagnosis not present

## 2022-10-01 LAB — BASIC METABOLIC PANEL
Anion gap: 8 (ref 5–15)
BUN: 16 mg/dL (ref 8–23)
CO2: 26 mmol/L (ref 22–32)
Calcium: 8.3 mg/dL — ABNORMAL LOW (ref 8.9–10.3)
Chloride: 103 mmol/L (ref 98–111)
Creatinine, Ser: 0.99 mg/dL (ref 0.44–1.00)
GFR, Estimated: >60 mL/min (ref >60)
Glucose, Bld: 111 mg/dL — ABNORMAL HIGH (ref 70–99)
Potassium: 3.9 mmol/L (ref 3.5–5.1)
Sodium: 137 mmol/L (ref 135–145)

## 2022-10-01 LAB — BLOOD CULTURE ID PANEL (REFLEXED) - BCID2

## 2022-10-01 LAB — PROCALCITONIN: Procalcitonin: 6.23 ng/mL

## 2022-10-01 LAB — CBC
HCT: 29.8 % — ABNORMAL LOW (ref 36.0–46.0)
Hemoglobin: 9.4 g/dL — ABNORMAL LOW (ref 12.0–15.0)
MCH: 30.5 pg (ref 26.0–34.0)
MCHC: 31.5 g/dL (ref 30.0–36.0)
MCV: 96.8 fL (ref 80.0–100.0)
Platelets: 276 10*3/uL (ref 150–400)
RBC: 3.08 MIL/uL — ABNORMAL LOW (ref 3.87–5.11)
RDW: 13.3 % (ref 11.5–15.5)
WBC: 17 10*3/uL — ABNORMAL HIGH (ref 4.0–10.5)
nRBC: 0.1 % (ref 0.0–0.2)

## 2022-10-01 LAB — URINE CULTURE
Culture: <10000 — AB
Culture: NO GROWTH

## 2022-10-01 LAB — GLUCOSE, CAPILLARY
Glucose-Capillary: 146 mg/dL — ABNORMAL HIGH (ref 70–99)
Glucose-Capillary: 141 mg/dL — ABNORMAL HIGH (ref 70–99)
Glucose-Capillary: 127 mg/dL — ABNORMAL HIGH (ref 70–99)
Glucose-Capillary: 154 mg/dL — ABNORMAL HIGH (ref 70–99)

## 2022-10-01 LAB — HEMOGLOBIN A1C
Hgb A1c MFr Bld: 6.6 % — ABNORMAL HIGH (ref 4.8–5.6)
Mean Plasma Glucose: 143 mg/dL

## 2022-10-01 LAB — CULTURE, BLOOD (ROUTINE X 2)

## 2022-10-01 LAB — BRAIN NATRIURETIC PEPTIDE: B Natriuretic Peptide: 411.8 pg/mL — ABNORMAL HIGH (ref 0.0–100.0)

## 2022-10-01 LAB — C-REACTIVE PROTEIN: CRP: 19.6 mg/dL — ABNORMAL HIGH (ref <1.0)

## 2022-10-01 LAB — MRSA NEXT GEN BY PCR, NASAL: MRSA by PCR Next Gen: NOT DETECTED

## 2022-10-01 LAB — MAGNESIUM: Magnesium: 2 mg/dL (ref 1.7–2.4)

## 2022-10-01 MED ORDER — INSULIN GLARGINE-YFGN 100 UNIT/ML ~~LOC~~ SOLN
10.0000 [IU] | Freq: Every day | SUBCUTANEOUS | Status: DC
  Administered 2022-10-01 – 2022-10-04 (×4): 10 [IU] via SUBCUTANEOUS
  Filled 2022-10-01 (×4): qty 0.1

## 2022-10-01 MED ORDER — CLOPIDOGREL BISULFATE 75 MG PO TABS
75.0000 mg | ORAL_TABLET | Freq: Every day | ORAL | Status: DC
  Administered 2022-10-01 – 2022-10-04 (×4): 75 mg via ORAL
  Filled 2022-10-01 (×4): qty 1

## 2022-10-01 MED ORDER — ASPIRIN 81 MG PO TBEC
81.0000 mg | DELAYED_RELEASE_TABLET | Freq: Every day | ORAL | Status: DC
  Administered 2022-10-01 – 2022-10-04 (×4): 81 mg via ORAL
  Filled 2022-10-01 (×4): qty 1

## 2022-10-01 MED ORDER — MAGIC MOUTHWASH W/LIDOCAINE
5.0000 mL | Freq: Four times a day (QID) | ORAL | Status: DC | PRN
  Administered 2022-10-02 – 2022-10-03 (×4): 5 mL via ORAL
  Filled 2022-10-01 (×8): qty 5

## 2022-10-01 MED ORDER — RISAQUAD PO CAPS
1.0000 | ORAL_CAPSULE | Freq: Every day | ORAL | Status: DC
  Administered 2022-10-01 – 2022-10-04 (×4): 1 via ORAL
  Filled 2022-10-01 (×4): qty 1

## 2022-10-01 MED ORDER — FENTANYL CITRATE PF 50 MCG/ML IJ SOSY
25.0000 ug | PREFILLED_SYRINGE | INTRAMUSCULAR | Status: DC | PRN
  Administered 2022-10-01 – 2022-10-03 (×4): 25 ug via INTRAVENOUS
  Filled 2022-10-01 (×4): qty 1

## 2022-10-01 MED ORDER — LACTATED RINGERS IV SOLN: INTRAVENOUS | Status: DC

## 2022-10-01 MED ORDER — LACTATED RINGERS IV SOLN: INTRAVENOUS | Status: AC

## 2022-10-01 MED ORDER — TAMSULOSIN HCL 0.4 MG PO CAPS
0.4000 mg | ORAL_CAPSULE | Freq: Every day | ORAL | Status: DC
  Administered 2022-10-01 – 2022-10-04 (×4): 0.4 mg via ORAL
  Filled 2022-10-01 (×4): qty 1

## 2022-10-01 MED ORDER — GUAIFENESIN-DM 100-10 MG/5ML PO SYRP
10.0000 mL | ORAL_SOLUTION | Freq: Four times a day (QID) | ORAL | Status: DC | PRN
  Administered 2022-10-01 – 2022-10-04 (×8): 10 mL via ORAL
  Filled 2022-10-01 (×10): qty 10

## 2022-10-01 NOTE — Consult Note (Signed)
Urology Consult Note   Requesting Attending Physician:  Leroy Sea, MD Service Providing Consult: Urology  Consulting Attending: Dr Modena Slater    Reason for Consult:  Sepsis with UPJO  HPI: Anna [name] is seen in consultation for reasons noted above at the request of Leroy Sea, MD for evaluation of sepsis with c/f urinary source as well as chronic UPJO.  This is a 67 y.o. female with h/o stroke on Plavix (last dose yesterday, asthma, HTN,HLD, migraine who presents with fever and chills  and mild nausea. She had one episode of vomiting. Denies hematuria. No dyuria, urgency or frequency. Denies flank pain or lower abdominal pain.    She has a history of stroke in December 2023, now on plavix.   In the ED, patient was febrile to a Tmax 100.5. She required NE and was tachycardic to 106. She was given 1 dose of cefepime and ceftriaxone. She was given sepsis fluid protocol. UA with c/f +LE, nitrites, few bacteria. Lactate elevated to 5.9. WBC of 23.4, and Cr 1.18 from base line 0.8-1.      Past Medical History: Past Medical History:  Diagnosis Date   Arthritis    Asthma    Back pain    Bronchitis    Bursitis    Diabetes mellitus    Diabetic neuropathy (HCC)    Diabetic retinopathy    Hyperlipemia    Hypertension    Migraine    Obesity    Sepsis due to urinary tract infection (HCC)    Stroke [facility])    2020    Past Surgical History:  Past Surgical History:  Procedure Laterality Date   ABDOMINAL AORTOGRAM W/LOWER EXTREMITY Left 08/22/2021   Procedure: ABDOMINAL AORTOGRAM W/LOWER EXTREMITY;  Surgeon: Nada Libman, MD;  Location: [facility] INVASIVE CV LAB;  Service: Cardiovascular;  Laterality: Left;   AMPUTATION Left 10/11/2021   Procedure: LEFT BELOW KNEE AMPUTATION;  Surgeon: Nadara Mustard, MD;  Location: [facility] OR;  Service: Orthopedics;  Laterality: Left;   CESAREAN SECTION     x3   CHOLECYSTECTOMY     CYSTOSCOPY WITH STENT PLACEMENT Right 09/30/2022    Procedure: CYSTOSCOPY WITH STENT PLACEMENT;  Surgeon: Crista Elliot, MD;  Location: [facility] OR;  Service: Urology;  Laterality: Right;   EYE SURGERY Left    METATARSAL HEAD EXCISION Left 08/24/2021   Procedure: METATARSAL HEAD EXCISION FIFTH TOE;  Surgeon: Vivi Barrack, DPM;  Location: [facility] OR;  Service: Podiatry;  Laterality: Left;   PERIPHERAL VASCULAR BALLOON ANGIOPLASTY  08/22/2021   Procedure: PERIPHERAL VASCULAR BALLOON ANGIOPLASTY;  Surgeon: Nada Libman, MD;  Location: [facility] CV [facility];  Service: Cardiovascular;;  Left AT   WOUND DEBRIDEMENT Left 08/24/2021   Procedure: DEBRIDEMENT WOUND OF LEFT FOOT;  Surgeon: Vivi Barrack, DPM;  Location: [facility] OR;  Service: Podiatry;  Laterality: Left;    Medication: Current Facility-Administered Medications  Medication Dose Route Frequency Provider Last Rate Last Admin   acetaminophen (TYLENOL) tablet 650 mg  650 mg Oral Q6H PRN Arrien, York Ram, MD   650 mg at 10/01/22 0431   Or   acetaminophen (TYLENOL) suppository 650 mg  650 mg Rectal Q6H PRN Arrien, York Ram, MD       albuterol (PROVENTIL) (2.5 MG/3ML) 0.083% nebulizer solution 3 mL  3 mL Inhalation Q6H PRN Arrien, York Ram, MD   3 mL at 10/01/22 1610   atorvastatin (LIPITOR) tablet 40 mg  40 mg Oral Daily Arrien,  York Ram, MD   40 mg at 09/30/22 1712   busPIRone (BUSPAR) tablet 7.5 mg  7.5 mg Oral BID Arrien, York Ram, MD   7.5 mg at 09/30/22 2134   cefTRIAXone (ROCEPHIN) 2 g in sodium chloride 0.9 % 100 mL IVPB  2 g Intravenous Q24H Cheri Fowler, MD 200 mL/hr at 09/30/22 2140 2 g at 09/30/22 2140   cyclobenzaprine (FLEXERIL) tablet 5 mg  5 mg Oral BID Coralie Keens, MD   5 mg at 09/30/22 2135   enoxaparin (LOVENOX) injection 40 mg  40 mg Subcutaneous Q24H Coralie Keens, MD   40 mg at 09/30/22 1712   fentaNYL (SUBLIMAZE) injection 25 mcg  25 mcg Intravenous Q2H PRN Hillary Bow, DO   25 mcg at 10/01/22 1610   gabapentin  (NEURONTIN) capsule 400 mg  400 mg Oral TID Coralie Keens, MD   400 mg at 09/30/22 2135   guaiFENesin-dextromethorphan (ROBITUSSIN DM) 100-10 MG/5ML syrup 10 mL  10 mL Oral Q6H PRN Leroy Sea, MD   10 mL at 10/01/22 0617   insulin aspart (novoLOG) injection 0-15 Units  0-15 Units Subcutaneous TID WC Arrien, York Ram, MD   3 Units at 09/30/22 1716   lactated ringers infusion   Intravenous Continuous Leroy Sea, MD 75 mL/hr at 10/01/22 0600 Rate Change at 10/01/22 0600   loratadine (CLARITIN) tablet 10 mg  10 mg Oral Daily PRN Arrien, York Ram, MD       ondansetron [facility]) tablet 4 mg  4 mg Oral Q6H PRN Arrien, York Ram, MD       Or   ondansetron [facility]) injection 4 mg  4 mg Intravenous Q6H PRN Arrien, York Ram, MD       pantoprazole (PROTONIX) EC tablet 40 mg  40 mg Oral Daily Arrien, York Ram, MD   40 mg at 09/30/22 1712   rOPINIRole (REQUIP) tablet 0.25 mg  0.25 mg Oral QHS Coralie Keens, MD   0.25 mg at 09/30/22 2135   topiramate (TOPAMAX) tablet 25 mg  25 mg Oral Daily Coralie Keens, MD   25 mg at 09/30/22 1712    Allergies: Allergies  Allergen Reactions   Bacitracin     Other reaction(s): Other Burns skin     Citalopram Diarrhea   Doxycycline Other (See Comments)    Other reaction(s): Other "burning" all over body     Morphine And Codeine Hives   Neomycin     Unknown reaction Not listed on the MAR    Ciprofloxacin     Tongue swells   Ciprofloxacin-Dexamethasone Other (See Comments)    unknown    Duloxetine     Other reaction(s): Mental Status Changes (intolerance) "saw pictures of gun"   Latex Itching   Levaquin [Levofloxacin In D5w] Other (See Comments)    Pt does not remember reaction but states she's had issues with med   Piper     Other reaction(s): Other (See Comments) White pepper- uncontrollable sneezing    Salvia Officinalis     Sneezing Not listed on the [facility]   Soma  [Carisoprodol]     Sleepy and constipation  Not listed on the [facility]   Lasix [Furosemide] Rash    Social History: Social History   Tobacco Use   Smoking status: Never   Smokeless tobacco: Never  Substance Use Topics   Alcohol use: No   Drug use: No    Family History History reviewed. No pertinent family history.  Review of Systems 10 systems were reviewed and are negative except as noted specifically in the HPI.  Objective   Vital signs in last 24 hours: BP (!) 101/53 (BP Location: Right Wrist)   Pulse 73   Temp (!) 97.5 F (36.4 C) (Oral)   Resp 11   Ht 5\' 2"  (1.575 m)   Wt 58.1 kg   SpO2 99%   BMI 23.41 kg/m   Physical Exam General: NAD, A&O, resting, appropriate HEENT: [place]/AT, EOMI, MMM Pulmonary: Normal work of breathing Cardiovascular: HDS, adequate peripheral perfusion Abdomen: Soft, NTTP, nondistended, . GU: voiding spontaenously, No CVA tenderness Extremities: warm and well perfused Neuro: Appropriate, no focal neurological deficits  Most Recent Labs: Lab Results  Component Value Date   WBC 17.0 (H) 10/01/2022   HGB 9.4 (L) 10/01/2022   HCT 29.8 (L) 10/01/2022   PLT 276 10/01/2022    Lab Results  Component Value Date   NA 137 10/01/2022   K 3.9 10/01/2022   CL 103 10/01/2022   CO2 26 10/01/2022   BUN 16 10/01/2022   CREATININE 0.99 10/01/2022   CALCIUM 8.3 (L) 10/01/2022   MG 2.0 10/01/2022   PHOS 3.5 08/20/2021    Lab Results  Component Value Date   INR 1.1 09/30/2022     Urine Culture: @[id number] (laburin,org,[id number],[id number])@   IMAGING: DG C-Arm 1-60 Min  Result Date: 09/30/2022 CLINICAL DATA:  surgery, elective EXAM: DG C-ARM 1-60 MIN COMPARISON:  CT CAP, 09/30/2022. FLUOROSCOPY: Exposure Index (as provided by the fluoroscopic device): 9.3 mGy Kerma FINDINGS: Limited oblique planar images of the RIGHT collecting system obtained C-arm. Images demonstrating retrograde opacification of the RIGHT ureter, with contrast opacification of  the RIGHT renal collecting system. Mild RIGHT pelviectasis, with transition at the ureteropelvic junction. No evidence of filling defect is demonstrated. IMPRESSION: Fluoroscopic imaging for intraoperative RIGHT retrograde nephrostogram. Mild RIGHT pelviectasis with transition at the UPJ. For complete description of intra procedural findings, please see performing service dictation. Electronically Signed   By: Roanna Banning M.D.   On: 09/30/2022 15:25   CT CHEST ABDOMEN PELVIS W CONTRAST  Result Date: 09/30/2022 CLINICAL DATA:  Sepsis. EXAM: CT CHEST, ABDOMEN, AND PELVIS WITH CONTRAST TECHNIQUE: Multidetector CT imaging of the chest, abdomen and pelvis was performed following the standard protocol during bolus administration of intravenous contrast. RADIATION DOSE REDUCTION: This exam was performed according to the departmental dose-optimization program which includes automated exposure control, adjustment of the mA and/or kV according to patient size and/or use of iterative reconstruction technique. CONTRAST:  75mL OMNIPAQUE IOHEXOL 350 MG/ML SOLN COMPARISON:  CT angio chest 08/02/2020 and CT AP from 06/23/2014 FINDINGS: CT CHEST FINDINGS Cardiovascular: Heart size is normal. No pericardial effusion. Aortic atherosclerosis and coronary artery calcification. Mediastinum/Nodes: Thyroid gland, trachea, and esophagus are unremarkable. No enlarged mediastinal or hilar lymph nodes. Lungs/Pleura: No pleural fluid, pneumothorax or interstitial edema. Patchy ground-glass attenuation is noted within the superior segment of the left lower lobe, mild patchy ground-glass attenuation is scattered within the left lower lobe. Bibasilar peripheral subpleural reticulation is noted. No airspace consolidation. Small nodule in the lateral right apex is unchanged measuring 4 mm. 3 mm right middle lobe lung nodule is also stable. These are compatible with a benign process require no further follow-up. Musculoskeletal: No acute or  suspicious osseous findings. CT ABDOMEN PELVIS FINDINGS Hepatobiliary: No focal liver abnormality is seen. Status post cholecystectomy. No biliary dilatation. Pancreas: Unremarkable. No pancreatic ductal dilatation or surrounding inflammatory changes. Spleen: Normal in size without focal  abnormality. Adrenals/Urinary Tract: Normal adrenal glands. Asymmetric right-sided pelvocaliectasis with dilated extrarenal pelvis to the UPJ. This is slightly progressive compared with the previous exam. No obstructing stone identified. Normal caliber right ureter. There is mild right-sided perinephric fat stranding which is new from the previous exam. Equivocal striated nephrograms within the right kidney noted on the delayed images. Unremarkable appearance of the left kidney with chronic left extrarenal pelvis. Tiny focus of gas noted within the non dependent portion of the bladder lumen. No focal bladder abnormality noted. Stomach/Bowel: Stomach appears normal. The appendix is not confidently identified. No secondary signs of acute appendicitis. No pathologic dilatation of large or small bowel loops. Mild stool burden is noted throughout the colon. Vascular/Lymphatic: Aortic atherosclerosis. No enlarged abdominopelvic lymph nodes. Increased multiplicity of normal size retroperitoneal lymph nodes identified. Reproductive: Uterus and bilateral adnexa are unremarkable. Other: No free fluid or fluid collections. Fat containing infraumbilical ventral abdominal wall hernias noted which measures 5.8 cm. No signs of pneumoperitoneum. Musculoskeletal: Multilevel degenerative disc disease noted throughout the lumbar spine. No acute or suspicious osseous findings. IMPRESSION: 1. Patchy ground-glass attenuation within the superior segment of the left lower lobe and left lower lobe, which may reflect mild atypical infection or inflammation. 2. Chronic, mildly progressive dilatation extrarenal to the level of the UPJ. Normal caliber right  ureter. No obstructing stone identified. 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 3. Mild right-sided perinephric fat stranding with equivocal striated nephrograms within the right kidney. A small amount of gas is noted within the non dependent portion of the urinary bladder. Cannot exclude urinary tract infection. Correlation with urinalysis is recommended. 4. Fat containing infraumbilical ventral abdominal wall hernia. 5. Aortic Atherosclerosis (ICD10-I70.0). Electronically Signed   By: Signa Kell M.D.   On: 09/30/2022 10:13   DG Chest 2 View  Result Date: 09/30/2022 CLINICAL DATA:  Sepsis suspected EXAM: CHEST - 2 VIEW COMPARISON:  04/14/2022 FINDINGS: The heart size and mediastinal contours are within normal limits. Both lungs are clear. The visualized skeletal structures are unremarkable. IMPRESSION: No active cardiopulmonary disease. Electronically Signed   By: Signa Kell M.D.   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   Pt stable today with improvement in MAP. She has been Afebrile. UOP appropriate. Bcx NG. Creatinine improved to 0.99. WBC downtrend to 17 from 23.   Endoreses some bladder spasm.    Recommendations: #urinary sepsis with UPJO -s/p R ureteral stent placement.  -continue ureteral stent at this time.  -We will see her in follow up. Follow up requested.  -continue BSA, follow up Ucx, treat for total course of 14 day -OK to remove foley catheter and TOV today.  -ok to start flomax for stent discomfort as well as vesicare for bladder spasm.    #AKI -Creatine back to baseline. AKI now resolved.    Thank you for this consult. Please contact the urology consult pager with any further questions/concerns.

## 2022-10-01 NOTE — Progress Notes (Signed)
PHARMACY - PHYSICIAN COMMUNICATION CRITICAL VALUE ALERT - BLOOD CULTURE IDENTIFICATION (BCID)  Anna [name] is an 67 y.o. female who presented to [facility] on 09/30/2022 with a chief complaint of Sepsis  Assessment:  1 of 4 blood cultures (aerobic bottle) growing E. Coli - no resistance  Name of physician (or Provider) Contacted: Dr. Thedore Mins, MD  Current antibiotics: Ceftriaxone  Changes to prescribed antibiotics recommended:  Continue current antibiotics  Results for orders placed or performed during the hospital encounter of 09/30/22  Blood Culture ID Panel (Reflexed) (Collected: 09/30/2022  6:30 AM)  Result Value Ref Range   Enterococcus faecalis NOT DETECTED NOT DETECTED   Enterococcus Faecium NOT DETECTED NOT DETECTED   Listeria monocytogenes NOT DETECTED NOT DETECTED   Staphylococcus species NOT DETECTED NOT DETECTED   Staphylococcus aureus (BCID) NOT DETECTED NOT DETECTED   Staphylococcus epidermidis NOT DETECTED NOT DETECTED   Staphylococcus lugdunensis NOT DETECTED NOT DETECTED   Streptococcus species NOT DETECTED NOT DETECTED   Streptococcus agalactiae NOT DETECTED NOT DETECTED   Streptococcus pneumoniae NOT DETECTED NOT DETECTED   Streptococcus pyogenes NOT DETECTED NOT DETECTED   A.calcoaceticus-baumannii NOT DETECTED NOT DETECTED   Bacteroides fragilis NOT DETECTED NOT DETECTED   Enterobacterales DETECTED (A) NOT DETECTED   Enterobacter cloacae complex NOT DETECTED NOT DETECTED   Escherichia coli DETECTED (A) NOT DETECTED   Klebsiella aerogenes NOT DETECTED NOT DETECTED   Klebsiella oxytoca NOT DETECTED NOT DETECTED   Klebsiella pneumoniae NOT DETECTED NOT DETECTED   Proteus species NOT DETECTED NOT DETECTED   Salmonella species NOT DETECTED NOT DETECTED   Serratia marcescens NOT DETECTED NOT DETECTED   Haemophilus influenzae NOT DETECTED NOT DETECTED   Neisseria meningitidis NOT DETECTED NOT DETECTED   Pseudomonas aeruginosa NOT DETECTED NOT DETECTED    Stenotrophomonas maltophilia NOT DETECTED NOT DETECTED   Candida albicans NOT DETECTED NOT DETECTED   Candida auris NOT DETECTED NOT DETECTED   Candida glabrata NOT DETECTED NOT DETECTED   Candida krusei NOT DETECTED NOT DETECTED   Candida parapsilosis NOT DETECTED NOT DETECTED   Candida tropicalis NOT DETECTED NOT DETECTED   Cryptococcus neoformans/gattii NOT DETECTED NOT DETECTED   CTX-M ESBL NOT DETECTED NOT DETECTED   Carbapenem resistance IMP NOT DETECTED NOT DETECTED   Carbapenem resistance KPC NOT DETECTED NOT DETECTED   Carbapenem resistance NDM NOT DETECTED NOT DETECTED   Carbapenem resist OXA 48 LIKE NOT DETECTED NOT DETECTED   Carbapenem resistance VIM NOT DETECTED NOT DETECTED    Ellis Savage, PharmD Clinical Pharmacist 10/01/2022  8:27 AM

## 2022-10-01 NOTE — Progress Notes (Signed)
PROGRESS NOTE                                                                                                                                                                                                             Patient Demographics:    Anna [name], is a 67 y.o. female, DOB - 11-13-1955, ZOX:[id number]  Outpatient Primary MD for the patient is Jethro Bastos, MD    LOS - 1  Admit date - 09/30/2022    Chief Complaint  Patient presents with   Hypotension   Fever       Brief Narrative (HPI from H&P)    67 y.o. female with medical history significant of T2DM, PAD, hyperlipidemia, hypertension, obesity, left AKA,  and asthma who presented with generalized weakness.  Diagnosed with right UPJ obstruction with pyelonephritis and sepsis and admitted to the hospital   Subjective:    Nkauj Masker today has, No headache, No chest pain, will have suprapubic pain and some right-sided flank pain, no shortness of breath or chest pain.   Assessment  & Plan :    Severe sepsis due to right UPJ obstruction causing pyelonephritis, E. coli bacteremia. she has been seen by urology, underwent cystoscopy with right ureteral stent placement, right retrograde pyelography with interpretation on 09/30/2022 by Dr. Modena Slater, also has a Foley catheter.  Currently on IV fluids and empiric IV Rocephin, sepsis pathophysiology is improving, continue supportive care, further management will be deferred to urology for this issue.  Follow final cultures.  PAD with left AKA.  Continue DAPT and statin for secondary prevention.  Essential hypertension - Hold on antihypertensive medications, blood pressure borderline but improving continue to monitor.  COPD mixed type (HCC) - Continue oxymetry monitoring, no signs of exacerbation, continue with bronchodilatory therapy.   Obesity OSA (obstructive sleep apnea) - Cpap at night, follow-up with PCP for  weight loss.  GERD (gastroesophageal reflux disease) - Continue pantoprazole.  Type 2 diabetes mellitus (HCC) -on Semglee and sliding scale monitor and adjust.  Lab Results  Component Value Date   HGBA1C 8.2 (H) 08/20/2021   CBG (last 3)  Recent Labs    09/30/22 1516 09/30/22 2046 10/01/22 0818  GLUCAP 166* 151* 146*        Condition - Extremely Guarded  Family Communication  : None present  Code Status :  Full code  Consults  : Urology  PUD Prophylaxis : PPI   Procedures  :      09/30/2022 by Dr. Modena Slater  for  Right UPJO with urinary sepsis      Procedure:   Cystoscopy right ureteral stent placement right retrograde pyelography with interpretation    CT - 1. Patchy ground-glass attenuation within the superior segment of the left lower lobe and left lower lobe, which may reflect mild atypical infection or inflammation. 2. Chronic, mildly progressive dilatation extrarenal to the level of the UPJ. Normal caliber right ureter. No obstructing stone identified. 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 3. Mild right-sided perinephric fat stranding with equivocal striated nephrograms within the right kidney. A small amount of gas is noted within the non dependent portion of the urinary bladder. Cannot exclude urinary tract infection. Correlation with urinalysis is recommended. 4. Fat containing infraumbilical ventral abdominal wall hernia. 5. Aortic Atherosclerosis (ICD10-I70.0).      Disposition Plan  :    Status is: Inpatient   DVT Prophylaxis  :    enoxaparin (LOVENOX) injection 40 mg Start: 09/30/22 1800 SCDs Start: 09/30/22 1148     Lab Results  Component Value Date   PLT 276 10/01/2022    Diet :  Diet Order             Diet Carb Modified Fluid consistency: Thin; Room service appropriate? Yes  Diet effective now                    Inpatient Medications  Scheduled Meds:   aspirin EC  81 mg Oral Daily   atorvastatin  40 mg Oral Daily   busPIRone  7.5 mg Oral BID   clopidogrel  75 mg Oral Daily   [facility]  1 capsule Oral Daily   cyclobenzaprine  5 mg Oral BID   enoxaparin (LOVENOX) injection  40 mg Subcutaneous Q24H   gabapentin  400 mg Oral TID   insulin aspart  0-15 Units Subcutaneous TID WC   pantoprazole  40 mg Oral Daily   rOPINIRole  0.25 mg Oral QHS   topiramate  25 mg Oral Daily   Continuous Infusions:  cefTRIAXone (ROCEPHIN)  IV 2 g (09/30/22 2140)   lactated ringers     PRN Meds:.acetaminophen **OR** acetaminophen, albuterol, fentaNYL (SUBLIMAZE) injection, guaiFENesin-dextromethorphan, loratadine, ondansetron **OR** ondansetron (ZOFRAN) IV    Objective:   Vitals:   09/30/22 1830 09/30/22 1944 10/01/22 0107 10/01/22 0331  BP: (!) 100/59 (!) 93/54 (!) 106/54 (!) 101/53  Pulse: 98 89 75 73  Resp: 16 19 14 11   Temp:  99.2 F (37.3 C) 97.6 F (36.4 C) (!) 97.5 F (36.4 C)  TempSrc:  Oral Oral Oral  SpO2: 99% 97% 98% 99%  Weight:      Height:        Wt Readings from Last 3 Encounters:  09/30/22 58.1 kg  05/29/22 64 kg  11/06/21 72.6 kg     Intake/Output Summary (Last 24 hours) at 10/01/2022 0821 Last data filed at 10/01/2022 0600 Gross per 24 hour  Intake 1431.53 ml  Output 890 ml  Net 541.53 ml     Physical Exam  Awake Alert, No new F.N deficits, Normal affect [place].AT,PERRAL Supple Neck, No JVD,   Symmetrical Chest wall movement, Good air movement bilaterally, CTAB RRR,No Gallops,Rubs or new Murmurs,  +ve B.Sounds, Abd Soft, No tenderness,   No Cyanosis,  Clubbing or edema      Data Review:    Recent Labs  Lab 09/30/22 0625 10/01/22 0617  WBC 23.4* 17.0*  HGB 11.2* 9.4*  HCT 35.4* 29.8*  PLT 336 276  MCV 92.7 96.8  MCH 29.3 30.5  MCHC 31.6 31.5  RDW 12.9 13.3  LYMPHSABS 1.5  --   MONOABS 1.7*  --   EOSABS 0.0  --   BASOSABS 0.1  --     Recent Labs  Lab 09/30/22 0625 09/30/22 0830  10/01/22 0617  NA 131*  --  137  K 4.0  --  3.9  CL 99  --  103  CO2 19*  --  26  ANIONGAP 13  --  8  GLUCOSE 239*  --  111*  BUN 19  --  16  CREATININE 1.18*  --  0.99  AST 24  --   --   ALT 22  --   --   ALKPHOS 76  --   --   BILITOT 0.9  --   --   ALBUMIN 3.3*  --   --   CRP  --   --  19.6*  PROCALCITON  --   --  6.23  LATICACIDVEN 2.6* 5.9*  --   INR 1.1  --   --   BNP  --   --  411.8*  MG  --   --  2.0  CALCIUM 8.7*  --  8.3*      Recent Labs  Lab 09/30/22 0625 09/30/22 0830 10/01/22 0617  CRP  --   --  19.6*  PROCALCITON  --   --  6.23  LATICACIDVEN 2.6* 5.9*  --   INR 1.1  --   --   BNP  --   --  411.8*  MG  --   --  2.0  CALCIUM 8.7*  --  8.3*      Radiology Reports DG C-Arm 1-60 Min  Result Date: 09/30/2022 CLINICAL DATA:  surgery, elective EXAM: DG C-ARM 1-60 MIN COMPARISON:  CT CAP, 09/30/2022. FLUOROSCOPY: Exposure Index (as provided by the fluoroscopic device): 9.3 mGy Kerma FINDINGS: Limited oblique planar images of the RIGHT collecting system obtained C-arm. Images demonstrating retrograde opacification of the RIGHT ureter, with contrast opacification of the RIGHT renal collecting system. Mild RIGHT pelviectasis, with transition at the ureteropelvic junction. No evidence of filling defect is demonstrated. IMPRESSION: Fluoroscopic imaging for intraoperative RIGHT retrograde nephrostogram. Mild RIGHT pelviectasis with transition at the UPJ. For complete description of intra procedural findings, please see performing service dictation. Electronically Signed   By: Roanna Banning M.D.   On: 09/30/2022 15:25   CT CHEST ABDOMEN PELVIS W CONTRAST  Result Date: 09/30/2022 CLINICAL DATA:  Sepsis. EXAM: CT CHEST, ABDOMEN, AND PELVIS WITH CONTRAST TECHNIQUE: Multidetector CT imaging of the chest, abdomen and pelvis was performed following the standard protocol during bolus administration of intravenous contrast. RADIATION DOSE REDUCTION: This exam was performed  according to the departmental dose-optimization program which includes automated exposure control, adjustment of the mA and/or kV according to patient size and/or use of iterative reconstruction technique. CONTRAST:  75mL OMNIPAQUE IOHEXOL 350 MG/ML SOLN COMPARISON:  CT angio chest 08/02/2020 and CT AP from 06/23/2014 FINDINGS: CT CHEST FINDINGS Cardiovascular: Heart size is normal. No pericardial effusion. Aortic atherosclerosis and coronary artery calcification. Mediastinum/Nodes: Thyroid gland, trachea, and esophagus are unremarkable. No enlarged mediastinal or hilar lymph nodes. Lungs/Pleura: No pleural fluid, pneumothorax or interstitial edema. Patchy ground-glass attenuation is noted  within the superior segment of the left lower lobe, mild patchy ground-glass attenuation is scattered within the left lower lobe. Bibasilar peripheral subpleural reticulation is noted. No airspace consolidation. Small nodule in the lateral right apex is unchanged measuring 4 mm. 3 mm right middle lobe lung nodule is also stable. These are compatible with a benign process require no further follow-up. Musculoskeletal: No acute or suspicious osseous findings. CT ABDOMEN PELVIS FINDINGS Hepatobiliary: No focal liver abnormality is seen. Status post cholecystectomy. No biliary dilatation. Pancreas: Unremarkable. No pancreatic ductal dilatation or surrounding inflammatory changes. Spleen: Normal in size without focal abnormality. Adrenals/Urinary Tract: Normal adrenal glands. Asymmetric right-sided pelvocaliectasis with dilated extrarenal pelvis to the UPJ. This is slightly progressive compared with the previous exam. No obstructing stone identified. Normal caliber right ureter. There is mild right-sided perinephric fat stranding which is new from the previous exam. Equivocal striated nephrograms within the right kidney noted on the delayed images. Unremarkable appearance of the left kidney with chronic left extrarenal pelvis. Tiny  focus of gas noted within the non dependent portion of the bladder lumen. No focal bladder abnormality noted. Stomach/Bowel: Stomach appears normal. The appendix is not confidently identified. No secondary signs of acute appendicitis. No pathologic dilatation of large or small bowel loops. Mild stool burden is noted throughout the colon. Vascular/Lymphatic: Aortic atherosclerosis. No enlarged abdominopelvic lymph nodes. Increased multiplicity of normal size retroperitoneal lymph nodes identified. Reproductive: Uterus and bilateral adnexa are unremarkable. Other: No free fluid or fluid collections. Fat containing infraumbilical ventral abdominal wall hernias noted which measures 5.8 cm. No signs of pneumoperitoneum. Musculoskeletal: Multilevel degenerative disc disease noted throughout the lumbar spine. No acute or suspicious osseous findings. IMPRESSION: 1. Patchy ground-glass attenuation within the superior segment of the left lower lobe and left lower lobe, which may reflect mild atypical infection or inflammation. 2. Chronic, mildly progressive dilatation extrarenal to the level of the UPJ. Normal caliber right ureter. No obstructing stone identified. 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 3. Mild right-sided perinephric fat stranding with equivocal striated nephrograms within the right kidney. A small amount of gas is noted within the non dependent portion of the urinary bladder. Cannot exclude urinary tract infection. Correlation with urinalysis is recommended. 4. Fat containing infraumbilical ventral abdominal wall hernia. 5. Aortic Atherosclerosis (ICD10-I70.0). Electronically Signed   By: Signa Kell M.D.   On: 09/30/2022 10:13   DG Chest 2 View  Result Date: 09/30/2022 CLINICAL DATA:  Sepsis suspected EXAM: CHEST - 2 VIEW COMPARISON:  04/14/2022 FINDINGS: The heart size and mediastinal contours are within normal limits. Both lungs are  clear. The visualized skeletal structures are unremarkable. IMPRESSION: No active cardiopulmonary disease. Electronically Signed   By: Signa Kell M.D.   On: 09/30/2022 07:02      Signature  -   Susa Raring M.D on 10/01/2022 at 8:21 AM   -  To page go to [email or web address]

## 2022-10-01 NOTE — Evaluation (Signed)
Occupational Therapy Evaluation Patient Details Name: Anna [name] MRN: [id number] DOB: Nov 27, 1955 Today's Date: 10/01/2022   History of Present Illness Pt is a 67 y/o F admitted on 09/30/22 after presenting with c/o generalized weakness x 3-4 days. Pt is being treated for sepsis 2/2 UTI, R pyelonephritis. Pt is s/p cystoscopy, R ureteral stent placement. PMH: DM2, HLD, HTN, obesity, L BKA, asthma, diabetic neuropathy, stroke (Dec. 2023)   Clinical Impression   Pt evaluated s/p above admission list. Pt presented from SNF and reports modified independence with ADL/IADLs and functional mobility with use of RW and manual w/c at baseline. Pt presents this session with generalized weakness, decreased balance, pain and history of L BKA with prosthetic not currently in room. Pt currently requires setup A for seated UB ADLs and min A for LB ADLs. Pt performed STS transfer and stand pivot transfer from EOB>chair using RW with min A and min verbal cues for hand placement. Pt would benefit from continued acute OT services to maximize functional independence and facilitate transition to skilled inpatient follow up therapy, <3 hours/day.        Recommendations for follow up therapy are one component of a multi-disciplinary discharge planning process, led by the attending physician.  Recommendations may be updated based on patient status, additional functional criteria and insurance authorization.   Assistance Recommended at Discharge Frequent or constant Supervision/Assistance  Patient can return home with the following A little help with walking and/or transfers;A little help with bathing/dressing/bathroom;Assistance with cooking/housework;Assist for transportation;Help with stairs or ramp for entrance    Functional Status Assessment  Patient has had a recent decline in their functional status and demonstrates the ability to make significant improvements in function in a reasonable and predictable amount  of time.  Equipment Recommendations  Other (comment) (defer)    Recommendations for Other Services       Precautions / Restrictions Precautions Precautions: Fall Precaution Comments: L BKA, does not have prosthetic in hospital with her Restrictions Weight Bearing Restrictions: No      Mobility Bed Mobility Overal bed mobility: Needs Assistance Bed Mobility: Supine to Sit     Supine to sit: Supervision, HOB elevated     General bed mobility comments: increased time    Transfers Overall transfer level: Needs assistance Equipment used: Rolling walker (2 wheels) Transfers: Sit to/from Stand, Bed to chair/wheelchair/BSC Sit to Stand: Min assist Stand pivot transfers: Min assist         General transfer comment: STS transfer and pivot to chair using RW with min A and min verbal cues for hand placement      Balance Overall balance assessment: Needs assistance Sitting-balance support: Feet supported, No upper extremity supported Sitting balance-Leahy Scale: Fair Sitting balance - Comments: sitting EOB   Standing balance support: Bilateral upper extremity supported, During functional activity, Reliant on assistive device for balance Standing balance-Leahy Scale: Poor Standing balance comment: BUE support on RW for stability                           ADL either performed or assessed with clinical judgement   ADL Overall ADL's : Needs assistance/impaired Eating/Feeding: Set up;Sitting   Grooming: Set up;Sitting   Upper Body Bathing: Set up;Sitting   Lower Body Bathing: Minimal assistance;Sitting/lateral leans   Upper Body Dressing : Set up;Sitting   Lower Body Dressing: Minimal assistance;Sitting/lateral leans   Toilet Transfer: Minimal assistance;Stand-pivot;Rolling walker (2 wheels) Toilet Transfer Details (indicate cue type  and reason): simulated Toileting- Clothing Manipulation and Hygiene: Minimal assistance;Sitting/lateral lean        Functional mobility during ADLs: Minimal assistance;Rolling walker (2 wheels) General ADL Comments: limited seconary to generalized weakness, pain, balance     Vision Baseline Vision/History: 1 Wears glasses Ability to See in Adequate Light: 0 Adequate Vision Assessment?: No apparent visual deficits     Perception Perception Perception Tested?: No   Praxis Praxis Praxis tested?: Not tested    Pertinent Vitals/Pain Pain Assessment Pain Assessment: Faces Faces Pain Scale: Hurts little more Pain Location: B groin, joints Pain Descriptors / Indicators: Discomfort, Aching Pain Intervention(s): Limited activity within patient's tolerance, Monitored during session     Hand Dominance Right   Extremity/Trunk Assessment Upper Extremity Assessment Upper Extremity Assessment: Generalized weakness   Lower Extremity Assessment Lower Extremity Assessment: Defer to PT evaluation   Cervical / Trunk Assessment Cervical / Trunk Assessment: Kyphotic   Communication Communication Communication: No difficulties   Cognition Arousal/Alertness: Awake/alert Behavior During Therapy: WFL for tasks assessed/performed Overall Cognitive Status: Within Functional Limits for tasks assessed                                 General Comments: A+O x4, self-distracting requiring mod cues to redirect.     General Comments  VSS on 1L [place]    Exercises     Shoulder Instructions      Home Living Family/patient expects to be discharged to:: Other (Comment) (hotel)                                 Additional Comments: Pt was residing with granddaughter but had to leave that situation & was at [facility] for Respite care (ending date of 7/11) with pt anticipating d/c to hotel, pt reports she has no assistance at d/c (hx of personal care aide but not currently) but speaks of a son who is somewhat involved. Pt did have hospital bed & [facility] but those are at her granddaughter's house  & she cannot access them at this time. Pt only has access to her w/c & RW. Pt is active with [facility] 4x/week.      Prior Functioning/Environment Prior Level of Function : Independent/Modified Independent             Mobility Comments: Pt reports she was able to perform bed mobility mod I with hospital bed, stand pivot transfers & ambulation up to 100 ft with RW & prosthetic, w/c mobility mod I. ADLs Comments: Reports modified independence with ADL/IADLs, wears briefs for incontinence.        OT Problem List: Decreased strength;Decreased activity tolerance;Impaired balance (sitting and/or standing);Pain      OT Treatment/Interventions: Self-care/ADL training;Therapeutic exercise;Energy conservation;DME and/or AE instruction;Therapeutic activities;Patient/family education;Balance training    OT Goals(Current goals can be found in the care plan section) Acute Rehab OT Goals Patient Stated Goal: to get stronger OT Goal Formulation: With patient Time For Goal Achievement: 10/15/22 Potential to Achieve Goals: Good ADL Goals Pt Will Perform Grooming: with modified independence;sitting Pt Will Perform Lower Body Dressing: with modified independence;sitting/lateral leans Pt Will Transfer to Toilet: with modified independence;stand pivot transfer;bedside commode Pt Will Perform Toileting - Clothing Manipulation and hygiene: with modified independence;sitting/lateral leans  OT Frequency: Min 2X/week    Co-evaluation              AM-PAC OT "  6 Clicks" Daily Activity     Outcome Measure Help from another person eating meals?: None Help from another person taking care of personal grooming?: A Little Help from another person toileting, which includes using toliet, bedpan, or urinal?: A Little Help from another person bathing (including washing, rinsing, drying)?: A Little Help from another person to put on and taking off regular upper body clothing?: A Little Help from another person to  put on and taking off regular lower body clothing?: A Little 6 Click Score: 19   End of Session Equipment Utilized During Treatment: Gait belt;Rolling walker (2 wheels);Oxygen Nurse Communication: Mobility status  Activity Tolerance: Patient tolerated treatment well Patient left: in chair;with call bell/phone within reach;with chair alarm set  OT Visit Diagnosis: Unsteadiness on feet (R26.81);Muscle weakness (generalized) (M62.81);History of falling (Z91.81);Pain                Time: 1610-9604 OT Time Calculation (min): 23 min Charges:  OT General Charges $OT Visit: 1 Visit OT Evaluation $OT Eval Moderate Complexity: 1 Mod OT Treatments $Self Care/Home Management : 8-22 mins  Sherley Bounds, OTS Acute Rehabilitation Services Office [phone] Secure Chat Communication Preferred   Sherley Bounds 10/01/2022, 2:19 PM

## 2022-10-01 NOTE — [place] FL2 (Signed)
[place] MEDICAID FL2 LEVEL OF CARE FORM     IDENTIFICATION  Patient Name: Anna [name] Birthdate: [birth year] Sex: female Admission Date (Current Location): 09/30/2022  [facility] and [place] Number:  Producer, television/film/video and Address:  [facility] [name], [address], [place], [place] [address]      Provider Number: [id number]  Attending Physician Name and Address:  Leroy Sea, MD  Relative Name and Phone Number:       Current Level of Care: Hospital Recommended Level of Care: Skilled Nursing Facility Prior Approval Number:    Date Approved/Denied:   PASRR Number: [id number] D expires 10/04/22  Discharge Plan: SNF    Current Diagnoses: Patient Active Problem List   Diagnosis Date Noted   Sepsis secondary to UTI (HCC) 09/30/2022   Gangrene of toe of left foot (HCC)    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) 10/06/2021   Dyslipidemia 10/06/2021   Mood disorder (HCC) 10/06/2021   Cellulitis 08/19/2021   Hyperkalemia 08/19/2021   OSA (obstructive sleep apnea) 08/19/2021   Anxiety 08/04/2021   Hyperlipidemia 08/04/2021   Thrombocytosis 08/04/2021   Status post amputation of lesser toe of left foot (HCC) 06/21/2021   PAD (peripheral artery disease) (HCC) 06/12/2021   Ischemic stroke (HCC) 04/11/2021   Type 2 diabetes mellitus with diabetic peripheral angiopathy and gangrene, with long-term current use of insulin (HCC) 04/03/2021   Left sided numbness 03/12/2021   Spinal stenosis of lumbar region with neurogenic claudication 03/18/2019   GERD (gastroesophageal reflux disease) 12/12/2017   History of adenomatous polyp of colon 01/14/2017   Dysphagia 09/12/2016   Screening for colon cancer 09/12/2016   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   Vision loss of left eye 12/09/2015   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 11/01/2015   Pyelonephritis 06/24/2014   Diabetic  neuropathy (HCC)    Essential hypertension    Type 2 diabetes mellitus (HCC)    COPD mixed type (HCC)    Acute pyelonephritis 06/23/2014    Orientation RESPIRATION BLADDER Height & Weight     Self, Time, Situation, Place  O2 (1L Nasal cannula) Incontinent, Indwelling catheter Weight: 128 lb (58.1 kg) Height:  5\' 2"  (157.5 cm)  BEHAVIORAL SYMPTOMS/MOOD NEUROLOGICAL BOWEL NUTRITION STATUS      Continent Diet (See dc summary)  AMBULATORY STATUS COMMUNICATION OF NEEDS Skin   Limited Assist Verbally Normal                       Personal Care Assistance Level of Assistance  Bathing, Feeding, Dressing Bathing Assistance: Limited assistance Feeding assistance: Limited assistance Dressing Assistance: Limited assistance     Functional Limitations Info  Sight Sight Info: Impaired        SPECIAL CARE FACTORS FREQUENCY                       Contractures      Additional Factors Info  Code Status, Allergies, Insulin Sliding Scale Code Status Info: full Allergies Info: Bacitracin, Citalopram, Doxycycline, Morphine And Codeine, Neomycin, Ciprofloxacin, Ciprofloxacin-dexamethasone, Duloxetine, Latex, Levaquin (Levofloxacin In D5w), Piper, Salvia Officinalis, Soma (Carisoprodol), Lasix (Furosemide)   Insulin Sliding Scale Info: See dc summary       Current Medications (10/01/2022):  This is the current hospital active medication list Current Facility-Administered Medications  Medication Dose Route Frequency Provider Last Rate Last Admin   acetaminophen (TYLENOL) tablet 650  mg  650 mg Oral Q6H PRN Coralie Keens, MD   650 mg at 10/01/22 1236   Or   acetaminophen (TYLENOL) suppository 650 mg  650 mg Rectal Q6H PRN Arrien, York Ram, MD       acidophilus (RISAQUAD) capsule 1 capsule  1 capsule Oral Daily Leroy Sea, MD   1 capsule at 10/01/22 0931   albuterol (PROVENTIL) (2.5 MG/3ML) 0.083% nebulizer solution 3 mL  3 mL Inhalation Q6H PRN Arrien,  York Ram, MD   3 mL at 10/01/22 1614   aspirin EC tablet 81 mg  81 mg Oral Daily Leroy Sea, MD   81 mg at 10/01/22 0931   atorvastatin (LIPITOR) tablet 40 mg  40 mg Oral Daily Arrien, York Ram, MD   40 mg at 10/01/22 0830   busPIRone (BUSPAR) tablet 7.5 mg  7.5 mg Oral BID Coralie Keens, MD   7.5 mg at 10/01/22 1610   cefTRIAXone (ROCEPHIN) 2 g in sodium chloride 0.9 % 100 mL IVPB  2 g Intravenous Q24H Cheri Fowler, MD 200 mL/hr at 09/30/22 2140 2 g at 09/30/22 2140   clopidogrel (PLAVIX) tablet 75 mg  75 mg Oral Daily Leroy Sea, MD   75 mg at 10/01/22 0931   cyclobenzaprine (FLEXERIL) tablet 5 mg  5 mg Oral BID Coralie Keens, MD   5 mg at 10/01/22 0829   enoxaparin (LOVENOX) injection 40 mg  40 mg Subcutaneous Q24H Coralie Keens, MD   40 mg at 09/30/22 1712   fentaNYL (SUBLIMAZE) injection 25 mcg  25 mcg Intravenous Q2H PRN Hillary Bow, DO   25 mcg at 10/01/22 0931   gabapentin (NEURONTIN) capsule 400 mg  400 mg Oral TID Coralie Keens, MD   400 mg at 10/01/22 0829   guaiFENesin-dextromethorphan (ROBITUSSIN DM) 100-10 MG/5ML syrup 10 mL  10 mL Oral Q6H PRN Leroy Sea, MD   10 mL at 10/01/22 0617   insulin aspart (novoLOG) injection 0-15 Units  0-15 Units Subcutaneous TID WC Arrien, York Ram, MD   3 Units at 09/30/22 1716   insulin glargine-yfgn (SEMGLEE) injection 10 Units  10 Units Subcutaneous Daily Leroy Sea, MD   10 Units at 10/01/22 1009   lactated ringers infusion   Intravenous Continuous Leroy Sea, MD 75 mL/hr at 10/01/22 0930 Restarted at 10/01/22 0930   loratadine (CLARITIN) tablet 10 mg  10 mg Oral Daily PRN Arrien, York Ram, MD       ondansetron [facility]) injection 4 mg  4 mg Intravenous Q6H PRN Arrien, York Ram, MD       pantoprazole (PROTONIX) EC tablet 40 mg  40 mg Oral Daily Arrien, York Ram, MD   40 mg at 10/01/22 9604   rOPINIRole (REQUIP) tablet 0.25 mg   0.25 mg Oral QHS Coralie Keens, MD   0.25 mg at 09/30/22 2135   tamsulosin (FLOMAX) capsule 0.4 mg  0.4 mg Oral Daily Leroy Sea, MD   0.4 mg at 10/01/22 0931   topiramate (TOPAMAX) tablet 25 mg  25 mg Oral Daily Coralie Keens, MD   25 mg at 10/01/22 5409     Discharge Medications: Please see discharge summary for a list of discharge medications.  Relevant Imaging Results:  Relevant Lab Results:   Additional Information SSN: [id number].  Mearl Latin, LCSW

## 2022-10-01 NOTE — Progress Notes (Signed)
RE: Anna [name] Date of Birth: Jul 28, 2055 Date: 10/01/22  Please be advised that the above-named patient will require a short-term nursing home stay - anticipated 30 days or less for rehabilitation and strengthening.  The plan is for return home.

## 2022-10-01 NOTE — Evaluation (Signed)
Physical Therapy Evaluation Patient Details Name: Anna [name] MRN: [id number] DOB: 05-18-1955 Today's Date: 10/01/2022  History of Present Illness  Pt is a 67 y/o F admitted on 09/30/22 after presenting with c/o generalized weakness x 3-4 days. Pt is being treated for sepsis 2/2 UTI, R pyelonephritis. Pt is s/p cystoscopy, R ureteral stent placement. PMH: DM2, HLD, HTN, obesity, L AKA, asthma, diabetic neuropathy, stroke (Dec. 2023)  Clinical Impression  Pt seen for PT evaluation with pt agreeable to tx. Pt reports at baseline, she is independent with bed mobility, transfers & gait with RW & RLE prosthetic, but also uses w/c. On this date, pt requires supervision for bed mobility, CGA for STS, and min assist for step pivot & short distance gait in room. Pt does not have prosthetic so anticipate pt will mobilize better once she has it donned. Pt limited by bilateral groin pain & overall discomfort but is motivated to participate. Recommend ongoing PT services to address deficits to increase independence & reduce fall risk with mobility.        Assistance Recommended at Discharge Intermittent Supervision/Assistance  If plan is discharge home, recommend the following:  Can travel by private vehicle  Assistance with cooking/housework;A little help with bathing/dressing/bathroom;A little help with walking and/or transfers;Assist for transportation;Help with stairs or ramp for entrance   Yes    Equipment Recommendations None recommended by PT  Recommendations for Other Services       Functional Status Assessment Patient has had a recent decline in their functional status and demonstrates the ability to make significant improvements in function in a reasonable and predictable amount of time.     Precautions / Restrictions Precautions Precautions: Fall Precaution Comments: L BKA, does not have prosthetic in hospital with her Restrictions Weight Bearing Restrictions: No      Mobility   Bed Mobility Overal bed mobility: Needs Assistance Bed Mobility: Supine to Sit     Supine to sit: Supervision, HOB elevated     General bed mobility comments: extra time to upright trunk to sitting EOB, use of bed rails.    Transfers Overall transfer level: Needs assistance Equipment used: Rolling walker (2 wheels) Transfers: Sit to/from Stand, Bed to chair/wheelchair/BSC Sit to Stand: Min guard   Step pivot transfers: Min assist (bed>recliner on L with decreased foot clearance RLE)       General transfer comment: good awareness of hand placement during STS from EOB, recliner.    Ambulation/Gait Ambulation/Gait assistance: Min assist Gait Distance (Feet):  (5 ft forwards + 5 ft backwards) Assistive device: Rolling walker (2 wheels) Gait Pattern/deviations: Step-to pattern, Decreased step length - right, Decreased stride length Gait velocity: decreased     General Gait Details: decreased foot clearance RLE  Stairs            Wheelchair Mobility     Tilt Bed    Modified Rankin (Stroke Patients Only)       Balance Overall balance assessment: Needs assistance Sitting-balance support: Feet supported Sitting balance-Leahy Scale: Fair Sitting balance - Comments: supervision static sitting   Standing balance support: Bilateral upper extremity supported, During functional activity, Reliant on assistive device for balance Standing balance-Leahy Scale: Fair                               Pertinent Vitals/Pain Pain Assessment Pain Assessment: Faces Faces Pain Scale: Hurts even more Pain Location: B groin, joints Pain Descriptors / Indicators:  Discomfort, Aching Pain Intervention(s): Monitored during session, Limited activity within patient's tolerance, Repositioned    Home Living Family/patient expects to be discharged to:: Other (Comment) (hotel)                   Additional Comments: Pt was residing with granddaughter but had to leave  that situation & was at [facility] for Respite care (ending date of 7/11) with pt anticipating d/c to hotel, pt reports she has no assistance at d/c (hx of personal care aide but not currently) but speaks of a son who is somewhat involved. Pt did have hospital bed & [facility] but those are at her granddaughter's house & she cannot access them at this time. Pt only has access to her w/c & RW.    Prior Function               Mobility Comments: Pt reports she was able to perform bed mobility mod I with hospital bed, stand pivot transfers & ambulation up to 100 ft with RW & prosthetic, w/c mobility mod I.       Hand Dominance        Extremity/Trunk Assessment   Upper Extremity Assessment Upper Extremity Assessment: Generalized weakness    Lower Extremity Assessment Lower Extremity Assessment: Generalized weakness       Communication   Communication: No difficulties  Cognition Arousal/Alertness: Awake/alert Behavior During Therapy: WFL for tasks assessed/performed Overall Cognitive Status: Within Functional Limits for tasks assessed                                          General Comments General comments (skin integrity, edema, etc.): Pt on 1L/min via nasal cannula, SPO2 >90% when pt had good pleth waveform. Pt c/o "wooziness" during gait attempt but BP in RUE once sitting 110/58 mmHg MAP 75. Pt endorses hx of vertigo.    Exercises     Assessment/Plan    PT Assessment Patient needs continued PT services  PT Problem List Decreased strength;Decreased mobility;Cardiopulmonary status limiting activity;Decreased activity tolerance;Pain;Decreased balance       PT Treatment Interventions DME instruction;Therapeutic exercise;Gait training;Balance training;Stair training;Functional mobility training;Neuromuscular re-education;Therapeutic activities;Patient/family education;Wheelchair mobility training    PT Goals (Current goals can be found in the Care Plan section)   Acute Rehab PT Goals Patient Stated Goal: get better, decreased pain PT Goal Formulation: With patient Time For Goal Achievement: 10/15/22 Potential to Achieve Goals: Good    Frequency Min 3X/week     Co-evaluation               AM-PAC PT "6 Clicks" Mobility  Outcome Measure Help needed turning from your back to your side while in a flat bed without using bedrails?: None Help needed moving from lying on your back to sitting on the side of a flat bed without using bedrails?: A Little Help needed moving to and from a bed to a chair (including a wheelchair)?: A Little Help needed standing up from a chair using your arms (e.g., wheelchair or bedside chair)?: A Little Help needed to walk in hospital room?: A Little Help needed climbing 3-5 steps with a railing? : A Lot 6 Click Score: 18    End of Session Equipment Utilized During Treatment: Gait belt Activity Tolerance: Patient tolerated treatment well;Patient limited by fatigue;Patient limited by pain Patient left: in chair;with chair alarm set;with call bell/phone within reach  Nurse Communication: Mobility status PT Visit Diagnosis: Muscle weakness (generalized) (M62.81);Unsteadiness on feet (R26.81);Difficulty in walking, not elsewhere classified (R26.2)    Time: 4098-1191 PT Time Calculation (min) (ACUTE ONLY): 36 min   Charges:   PT Evaluation $PT Eval Low Complexity: 1 Low   PT General Charges $$ ACUTE PT VISIT: 1 Visit         Aleda Grana, PT, DPT 10/01/22, 9:40 AM   Sandi Mariscal 10/01/2022, 9:40 AM

## 2022-10-02 DIAGNOSIS — N390 Urinary tract infection, site not specified: Secondary | ICD-10-CM | POA: Diagnosis not present

## 2022-10-02 DIAGNOSIS — A419 Sepsis, unspecified organism: Secondary | ICD-10-CM | POA: Diagnosis not present

## 2022-10-02 LAB — BASIC METABOLIC PANEL
Anion gap: 9 (ref 5–15)
BUN: 19 mg/dL (ref 8–23)
CO2: 24 mmol/L (ref 22–32)
Calcium: 8 mg/dL — ABNORMAL LOW (ref 8.9–10.3)
Chloride: 100 mmol/L (ref 98–111)
Creatinine, Ser: 1.04 mg/dL — ABNORMAL HIGH (ref 0.44–1.00)
GFR, Estimated: 59 mL/min — ABNORMAL LOW (ref >60)
Glucose, Bld: 147 mg/dL — ABNORMAL HIGH (ref 70–99)
Potassium: 4.1 mmol/L (ref 3.5–5.1)
Sodium: 133 mmol/L — ABNORMAL LOW (ref 135–145)

## 2022-10-02 LAB — CBC WITH DIFFERENTIAL/PLATELET
Abs Immature Granulocytes: 0.04 10*3/uL (ref 0.00–0.07)
Basophils Absolute: 0 10*3/uL (ref 0.0–0.1)
Basophils Relative: 0 %
Eosinophils Absolute: 0.3 10*3/uL (ref 0.0–0.5)
Eosinophils Relative: 2 %
HCT: 28.9 % — ABNORMAL LOW (ref 36.0–46.0)
Hemoglobin: 9.2 g/dL — ABNORMAL LOW (ref 12.0–15.0)
Immature Granulocytes: 0 %
Lymphocytes Relative: 17 %
Lymphs Abs: 1.9 10*3/uL (ref 0.7–4.0)
MCH: 30.2 pg (ref 26.0–34.0)
MCHC: 31.8 g/dL (ref 30.0–36.0)
MCV: 94.8 fL (ref 80.0–100.0)
Monocytes Absolute: 1.2 10*3/uL — ABNORMAL HIGH (ref 0.1–1.0)
Monocytes Relative: 11 %
Neutro Abs: 7.8 10*3/uL — ABNORMAL HIGH (ref 1.7–7.7)
Neutrophils Relative %: 70 %
Platelets: 245 10*3/uL (ref 150–400)
RBC: 3.05 MIL/uL — ABNORMAL LOW (ref 3.87–5.11)
RDW: 12.9 % (ref 11.5–15.5)
WBC: 11.2 10*3/uL — ABNORMAL HIGH (ref 4.0–10.5)
nRBC: 0 % (ref 0.0–0.2)

## 2022-10-02 LAB — GLUCOSE, CAPILLARY
Glucose-Capillary: 154 mg/dL — ABNORMAL HIGH (ref 70–99)
Glucose-Capillary: 150 mg/dL — ABNORMAL HIGH (ref 70–99)
Glucose-Capillary: 148 mg/dL — ABNORMAL HIGH (ref 70–99)
Glucose-Capillary: 133 mg/dL — ABNORMAL HIGH (ref 70–99)

## 2022-10-02 LAB — PROCALCITONIN: Procalcitonin: 4.03 ng/mL

## 2022-10-02 LAB — MAGNESIUM: Magnesium: 1.9 mg/dL (ref 1.7–2.4)

## 2022-10-02 LAB — CULTURE, BLOOD (ROUTINE X 2)

## 2022-10-02 LAB — C-REACTIVE PROTEIN: CRP: 13 mg/dL — ABNORMAL HIGH (ref <1.0)

## 2022-10-02 LAB — BRAIN NATRIURETIC PEPTIDE: B Natriuretic Peptide: 360.9 pg/mL — ABNORMAL HIGH (ref 0.0–100.0)

## 2022-10-02 LAB — GROUP A STREP BY PCR: Group A Strep by PCR: NOT DETECTED

## 2022-10-02 NOTE — Progress Notes (Addendum)
PROGRESS NOTE                                                                                                                                                                                                             Patient Demographics:    Anna [name], is a 67 y.o. female, DOB - [birth year], ZOX:[id number]  Outpatient Primary MD for the patient is Jethro Bastos, MD    LOS - 2  Admit date - 09/30/2022    Chief Complaint  Patient presents with   Hypotension   Fever       Brief Narrative (HPI from H&P)    67 y.o. female with medical history significant of T2DM, PAD, hyperlipidemia, hypertension, obesity, left AKA,  and asthma who presented with generalized weakness.  Diagnosed with right UPJ obstruction with pyelonephritis and sepsis and admitted to the hospital   Subjective:    Patient in bed, appears comfortable, denies any headache, no fever, no chest pain or pressure, no shortness of breath , no abdominal pain. No new focal weakness.  Assessment  & Plan :   Severe sepsis due to right UPJ obstruction causing pyelonephritis, E. coli bacteremia. she has been seen by urology, underwent cystoscopy with right ureteral stent placement, right retrograde pyelography with interpretation on 09/30/2022 by Dr. Modena Slater, also has a Foley catheter.  Currently on IV fluids and empiric IV Rocephin, sepsis pathophysiology is improving, continue supportive care, further management will be deferred to urology for this issue.  Follow final cultures.  PAD with left AKA.  Continue DAPT and statin for secondary prevention.  Essential hypertension - Hold on antihypertensive medications, blood pressure borderline but improving continue to monitor.  COPD mixed type (HCC) - Continue oxymetry monitoring, no signs of exacerbation, continue with bronchodilatory therapy.   Obesity OSA (obstructive sleep apnea) - Cpap at night, follow-up with  PCP for weight loss.  GERD (gastroesophageal reflux disease) - Continue pantoprazole.  Hyponatremia - check Sr Osm, Ur sodium + Osm  Type 2 diabetes mellitus (HCC) - on Semglee and sliding scale monitor and adjust.  Lab Results  Component Value Date   HGBA1C 6.6 (H) 09/30/2022   CBG (last 3)  Recent Labs    10/01/22 1223 10/01/22 1617 10/01/22 2125  GLUCAP 141* 127* 154*        Condition - Extremely  Guarded  Family Communication  : None present  Code Status : Full code  Consults  : Urology  PUD Prophylaxis : PPI   Procedures  :      09/30/2022 by Dr. Modena Slater  for  Right UPJO with urinary sepsis      Procedure:   Cystoscopy right ureteral stent placement right retrograde pyelography with interpretation    CT - 1. Patchy ground-glass attenuation within the superior segment of the left lower lobe and left lower lobe, which may reflect mild atypical infection or inflammation. 2. Chronic, mildly progressive dilatation extrarenal to the level of the UPJ. Normal caliber right ureter. No obstructing stone identified. 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 3. Mild right-sided perinephric fat stranding with equivocal striated nephrograms within the right kidney. A small amount of gas is noted within the non dependent portion of the urinary bladder. Cannot exclude urinary tract infection. Correlation with urinalysis is recommended. 4. Fat containing infraumbilical ventral abdominal wall hernia. 5. Aortic Atherosclerosis (ICD10-I70.0).      Disposition Plan  :    Status is: Inpatient   DVT Prophylaxis  :    enoxaparin (LOVENOX) injection 40 mg Start: 09/30/22 1800 SCDs Start: 09/30/22 1148     Lab Results  Component Value Date   PLT 245 10/02/2022    Diet :  Diet Order             Diet Carb Modified Fluid consistency: Thin; Room service appropriate? Yes  Diet effective now                     Inpatient Medications  Scheduled Meds:  acidophilus  1 capsule Oral Daily   aspirin EC  81 mg Oral Daily   atorvastatin  40 mg Oral Daily   busPIRone  7.5 mg Oral BID   clopidogrel  75 mg Oral Daily   cyclobenzaprine  5 mg Oral BID   enoxaparin (LOVENOX) injection  40 mg Subcutaneous Q24H   gabapentin  400 mg Oral TID   insulin aspart  0-15 Units Subcutaneous TID WC   insulin glargine-yfgn  10 Units Subcutaneous Daily   pantoprazole  40 mg Oral Daily   rOPINIRole  0.25 mg Oral QHS   tamsulosin  0.4 mg Oral Daily   topiramate  25 mg Oral Daily   Continuous Infusions:  cefTRIAXone (ROCEPHIN)  IV 2 g (10/01/22 2127)   lactated ringers 75 mL/hr at 10/01/22 0930   PRN Meds:.acetaminophen **OR** acetaminophen, albuterol, fentaNYL (SUBLIMAZE) injection, guaiFENesin-dextromethorphan, loratadine, magic mouthwash w/lidocaine, [DISCONTINUED] ondansetron **OR** ondansetron (ZOFRAN) IV    Objective:   Vitals:   10/01/22 1620 10/01/22 2015 10/01/22 2335 10/02/22 0303  BP: (!) 121/57 121/65 120/64 116/65  Pulse: (!) 101 98 99 96  Resp: 15 20 18 18   Temp: (!) 101.7 F (38.7 C) 98.9 F (37.2 C) 98.9 F (37.2 C) 98.8 F (37.1 C)  TempSrc: Oral Oral Oral Oral  SpO2: 98% 97% 96% 97%  Weight:      Height:        Wt Readings from Last 3 Encounters:  09/30/22 58.1 kg  05/29/22 64 kg  11/06/21 72.6 kg     Intake/Output Summary (Last 24 hours) at 10/02/2022 0731 Last data filed at 10/02/2022 0600 Gross per 24 hour  Intake 1892.5 ml  Output 2200 ml  Net -307.5 ml     Physical Exam  Awake Alert, No new F.N deficits,  Normal affect [place].AT,PERRAL Supple Neck, No JVD,   Symmetrical Chest wall movement, Good air movement bilaterally, CTAB RRR,No Gallops,Rubs or new Murmurs,  +ve B.Sounds, Abd Soft, No tenderness,   L AKA     Data Review:    Recent Labs  Lab 09/30/22 0625 10/01/22 0617 10/02/22 0305  WBC 23.4* 17.0* 11.2*  HGB 11.2* 9.4* 9.2*  HCT 35.4* 29.8* 28.9*   PLT 336 276 245  MCV 92.7 96.8 94.8  MCH 29.3 30.5 30.2  MCHC 31.6 31.5 31.8  RDW 12.9 13.3 12.9  LYMPHSABS 1.5  --  1.9  MONOABS 1.7*  --  1.2*  EOSABS 0.0  --  0.3  BASOSABS 0.1  --  0.0    Recent Labs  Lab 09/30/22 0625 09/30/22 0830 10/01/22 0617 10/02/22 0305  NA 131*  --  137 133*  K 4.0  --  3.9 4.1  CL 99  --  103 100  CO2 19*  --  26 24  ANIONGAP 13  --  8 9  GLUCOSE 239*  --  111* 147*  BUN 19  --  16 19  CREATININE 1.18*  --  0.99 1.04*  AST 24  --   --   --   ALT 22  --   --   --   ALKPHOS 76  --   --   --   BILITOT 0.9  --   --   --   ALBUMIN 3.3*  --   --   --   CRP  --   --  19.6* 13.0*  PROCALCITON  --   --  6.23 4.03  LATICACIDVEN 2.6* 5.9*  --   --   INR 1.1  --   --   --   HGBA1C 6.6*  --   --   --   BNP  --   --  411.8* 360.9*  MG  --   --  2.0 1.9  CALCIUM 8.7*  --  8.3* 8.0*      Recent Labs  Lab 09/30/22 0625 09/30/22 0830 10/01/22 0617 10/02/22 0305  CRP  --   --  19.6* 13.0*  PROCALCITON  --   --  6.23 4.03  LATICACIDVEN 2.6* 5.9*  --   --   INR 1.1  --   --   --   HGBA1C 6.6*  --   --   --   BNP  --   --  411.8* 360.9*  MG  --   --  2.0 1.9  CALCIUM 8.7*  --  8.3* 8.0*      Radiology Reports DG C-Arm 1-60 Min  Result Date: 09/30/2022 CLINICAL DATA:  surgery, elective EXAM: DG C-ARM 1-60 MIN COMPARISON:  CT CAP, 09/30/2022. FLUOROSCOPY: Exposure Index (as provided by the fluoroscopic device): 9.3 mGy Kerma FINDINGS: Limited oblique planar images of the RIGHT collecting system obtained C-arm. Images demonstrating retrograde opacification of the RIGHT ureter, with contrast opacification of the RIGHT renal collecting system. Mild RIGHT pelviectasis, with transition at the ureteropelvic junction. No evidence of filling defect is demonstrated. IMPRESSION: Fluoroscopic imaging for intraoperative RIGHT retrograde nephrostogram. Mild RIGHT pelviectasis with transition at the UPJ. For complete description of intra procedural findings,  please see performing service dictation. Electronically Signed   By: Roanna Banning M.D.   On: 09/30/2022 15:25   CT CHEST ABDOMEN PELVIS W CONTRAST  Result Date: 09/30/2022 CLINICAL DATA:  Sepsis. EXAM: CT CHEST, ABDOMEN, AND PELVIS WITH CONTRAST TECHNIQUE: Multidetector CT imaging  of the chest, abdomen and pelvis was performed following the standard protocol during bolus administration of intravenous contrast. RADIATION DOSE REDUCTION: This exam was performed according to the departmental dose-optimization program which includes automated exposure control, adjustment of the mA and/or kV according to patient size and/or use of iterative reconstruction technique. CONTRAST:  75mL OMNIPAQUE IOHEXOL 350 MG/ML SOLN COMPARISON:  CT angio chest 08/02/2020 and CT AP from 06/23/2014 FINDINGS: CT CHEST FINDINGS Cardiovascular: Heart size is normal. No pericardial effusion. Aortic atherosclerosis and coronary artery calcification. Mediastinum/Nodes: Thyroid gland, trachea, and esophagus are unremarkable. No enlarged mediastinal or hilar lymph nodes. Lungs/Pleura: No pleural fluid, pneumothorax or interstitial edema. Patchy ground-glass attenuation is noted within the superior segment of the left lower lobe, mild patchy ground-glass attenuation is scattered within the left lower lobe. Bibasilar peripheral subpleural reticulation is noted. No airspace consolidation. Small nodule in the lateral right apex is unchanged measuring 4 mm. 3 mm right middle lobe lung nodule is also stable. These are compatible with a benign process require no further follow-up. Musculoskeletal: No acute or suspicious osseous findings. CT ABDOMEN PELVIS FINDINGS Hepatobiliary: No focal liver abnormality is seen. Status post cholecystectomy. No biliary dilatation. Pancreas: Unremarkable. No pancreatic ductal dilatation or surrounding inflammatory changes. Spleen: Normal in size without focal abnormality. Adrenals/Urinary Tract: Normal adrenal glands.  Asymmetric right-sided pelvocaliectasis with dilated extrarenal pelvis to the UPJ. This is slightly progressive compared with the previous exam. No obstructing stone identified. Normal caliber right ureter. There is mild right-sided perinephric fat stranding which is new from the previous exam. Equivocal striated nephrograms within the right kidney noted on the delayed images. Unremarkable appearance of the left kidney with chronic left extrarenal pelvis. Tiny focus of gas noted within the non dependent portion of the bladder lumen. No focal bladder abnormality noted. Stomach/Bowel: Stomach appears normal. The appendix is not confidently identified. No secondary signs of acute appendicitis. No pathologic dilatation of large or small bowel loops. Mild stool burden is noted throughout the colon. Vascular/Lymphatic: Aortic atherosclerosis. No enlarged abdominopelvic lymph nodes. Increased multiplicity of normal size retroperitoneal lymph nodes identified. Reproductive: Uterus and bilateral adnexa are unremarkable. Other: No free fluid or fluid collections. Fat containing infraumbilical ventral abdominal wall hernias noted which measures 5.8 cm. No signs of pneumoperitoneum. Musculoskeletal: Multilevel degenerative disc disease noted throughout the lumbar spine. No acute or suspicious osseous findings. IMPRESSION: 1. Patchy ground-glass attenuation within the superior segment of the left lower lobe and left lower lobe, which may reflect mild atypical infection or inflammation. 2. Chronic, mildly progressive dilatation extrarenal to the level of the UPJ. Normal caliber right ureter. No obstructing stone identified. 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 3. Mild right-sided perinephric fat stranding with equivocal striated nephrograms within the right kidney. A small amount of gas is noted within the non dependent portion of the urinary bladder. Cannot  exclude urinary tract infection. Correlation with urinalysis is recommended. 4. Fat containing infraumbilical ventral abdominal wall hernia. 5. Aortic Atherosclerosis (ICD10-I70.0). Electronically Signed   By: Signa Kell M.D.   On: 09/30/2022 10:13   DG Chest 2 View  Result Date: 09/30/2022 CLINICAL DATA:  Sepsis suspected EXAM: CHEST - 2 VIEW COMPARISON:  04/14/2022 FINDINGS: The heart size and mediastinal contours are within normal limits. Both lungs are clear. The visualized skeletal structures are unremarkable. IMPRESSION: No active cardiopulmonary disease. Electronically Signed   By: Signa Kell M.D.   On: 09/30/2022 07:02  Signature  -   Susa Raring M.D on 10/02/2022 at 7:31 AM   -  To page go to [email or web address]

## 2022-10-03 DIAGNOSIS — N390 Urinary tract infection, site not specified: Secondary | ICD-10-CM | POA: Diagnosis not present

## 2022-10-03 DIAGNOSIS — A419 Sepsis, unspecified organism: Secondary | ICD-10-CM | POA: Diagnosis not present

## 2022-10-03 LAB — BASIC METABOLIC PANEL
Anion gap: 12 (ref 5–15)
BUN: 16 mg/dL (ref 8–23)
CO2: 22 mmol/L (ref 22–32)
Calcium: 8.4 mg/dL — ABNORMAL LOW (ref 8.9–10.3)
Chloride: 103 mmol/L (ref 98–111)
Creatinine, Ser: 1.01 mg/dL — ABNORMAL HIGH (ref 0.44–1.00)
GFR, Estimated: >60 mL/min (ref >60)
Glucose, Bld: 186 mg/dL — ABNORMAL HIGH (ref 70–99)
Potassium: 4.3 mmol/L (ref 3.5–5.1)
Sodium: 137 mmol/L (ref 135–145)

## 2022-10-03 LAB — CBC WITH DIFFERENTIAL/PLATELET
Abs Immature Granulocytes: 0.02 10*3/uL (ref 0.00–0.07)
Basophils Absolute: 0 10*3/uL (ref 0.0–0.1)
Basophils Relative: 1 %
Eosinophils Absolute: 0.3 10*3/uL (ref 0.0–0.5)
Eosinophils Relative: 4 %
HCT: 30 % — ABNORMAL LOW (ref 36.0–46.0)
Hemoglobin: 9.5 g/dL — ABNORMAL LOW (ref 12.0–15.0)
Immature Granulocytes: 0 %
Lymphocytes Relative: 16 %
Lymphs Abs: 1.4 10*3/uL (ref 0.7–4.0)
MCH: 30.4 pg (ref 26.0–34.0)
MCHC: 31.7 g/dL (ref 30.0–36.0)
MCV: 95.8 fL (ref 80.0–100.0)
Monocytes Absolute: 0.9 10*3/uL (ref 0.1–1.0)
Monocytes Relative: 11 %
Neutro Abs: 6.1 10*3/uL (ref 1.7–7.7)
Neutrophils Relative %: 68 %
Platelets: 256 10*3/uL (ref 150–400)
RBC: 3.13 MIL/uL — ABNORMAL LOW (ref 3.87–5.11)
RDW: 12.9 % (ref 11.5–15.5)
WBC: 8.8 10*3/uL (ref 4.0–10.5)
nRBC: 0 % (ref 0.0–0.2)

## 2022-10-03 LAB — C-REACTIVE PROTEIN: CRP: 8.8 mg/dL — ABNORMAL HIGH (ref <1.0)

## 2022-10-03 LAB — GLUCOSE, CAPILLARY
Glucose-Capillary: 116 mg/dL — ABNORMAL HIGH (ref 70–99)
Glucose-Capillary: 199 mg/dL — ABNORMAL HIGH (ref 70–99)
Glucose-Capillary: 120 mg/dL — ABNORMAL HIGH (ref 70–99)
Glucose-Capillary: 200 mg/dL — ABNORMAL HIGH (ref 70–99)

## 2022-10-03 LAB — MAGNESIUM: Magnesium: 1.9 mg/dL (ref 1.7–2.4)

## 2022-10-03 LAB — CULTURE, BLOOD (ROUTINE X 2)

## 2022-10-03 LAB — PROCALCITONIN: Procalcitonin: 2.07 ng/mL

## 2022-10-03 LAB — BRAIN NATRIURETIC PEPTIDE: B Natriuretic Peptide: 242.7 pg/mL — ABNORMAL HIGH (ref 0.0–100.0)

## 2022-10-03 NOTE — Progress Notes (Signed)
  Progress Note   Date: 10/02/2022  Patient Name: Anna [name]        MRN#: [id number]  Clarification of the diagnosis of AKI:   AKI, now resolved and the baseline creatinine was 0.7-1. Marland Kitchen

## 2022-10-03 NOTE — Progress Notes (Signed)
PROGRESS NOTE                                                                                                                                                                                                             Patient Demographics:    Anna [name], is a 67 y.o. female, DOB - [birth year], GEX:[id number]  Outpatient Primary MD for the patient is Jethro Bastos, MD    LOS - 3  Admit date - 09/30/2022    Chief Complaint  Patient presents with   Hypotension   Fever       Brief Narrative (HPI from H&P)    67 y.o. female with medical history significant of T2DM, PAD, hyperlipidemia, hypertension, obesity, left AKA,  and asthma who presented with generalized weakness.  Diagnosed with right UPJ obstruction with pyelonephritis and sepsis and admitted to the hospital   Subjective:   Patient in bed, appears comfortable, denies any headache, no fever, no chest pain or pressure, no shortness of breath , no abdominal pain. No focal weakness.   Assessment  & Plan :   Severe sepsis due to right UPJ obstruction causing pyelonephritis, E. coli bacteremia. she has been seen by urology, underwent cystoscopy with right ureteral stent placement, right retrograde pyelography with interpretation on 09/30/2022 by Dr. Modena Slater, also has a Foley catheter.  Currently on IV fluids and empiric IV Rocephin, sepsis pathophysiology is improving, continue supportive care, further management will be deferred to urology for this issue.  Follow final cultures.  PAD with left AKA.  Continue DAPT and statin for secondary prevention.  AKI due to sepsis.  Resolved.    Essential hypertension - Hold on antihypertensive medications, blood pressure borderline but improving continue to monitor.  COPD mixed type (HCC) - Continue oxymetry monitoring, no signs of exacerbation, continue with bronchodilatory therapy.   Obesity OSA (obstructive sleep apnea)  - Cpap at night, follow-up with PCP for weight loss.  GERD (gastroesophageal reflux disease) - Continue pantoprazole.  Hyponatremia - due to dehydration resolved with IV fluids.  Type 2 diabetes mellitus (HCC) - on Semglee and sliding scale monitor and adjust.  Lab Results  Component Value Date   HGBA1C 6.6 (H) 09/30/2022   CBG (last 3)  Recent Labs    10/02/22 1623 10/02/22 2155 10/03/22 0732  GLUCAP 148* 133* 116*  Condition - Extremely Guarded  Family Communication  : None present  Code Status : Full code  Consults  : Urology  PUD Prophylaxis : PPI   Procedures  :      09/30/2022 by Dr. Modena Slater  for  Right UPJO with urinary sepsis      Procedure:   Cystoscopy right ureteral stent placement right retrograde pyelography with interpretation    CT - 1. Patchy ground-glass attenuation within the superior segment of the left lower lobe and left lower lobe, which may reflect mild atypical infection or inflammation. 2. Chronic, mildly progressive dilatation extrarenal to the level of the UPJ. Normal caliber right ureter. No obstructing stone identified. 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 3. Mild right-sided perinephric fat stranding with equivocal striated nephrograms within the right kidney. A small amount of gas is noted within the non dependent portion of the urinary bladder. Cannot exclude urinary tract infection. Correlation with urinalysis is recommended. 4. Fat containing infraumbilical ventral abdominal wall hernia. 5. Aortic Atherosclerosis (ICD10-I70.0).      Disposition Plan  :    Status is: Inpatient   DVT Prophylaxis  :    enoxaparin (LOVENOX) injection 40 mg Start: 09/30/22 1800 SCDs Start: 09/30/22 1148     Lab Results  Component Value Date   PLT 256 10/03/2022    Diet :  Diet Order             Diet Carb Modified Fluid consistency: Thin; Room service  appropriate? Yes  Diet effective now                    Inpatient Medications  Scheduled Meds:  acidophilus  1 capsule Oral Daily   aspirin EC  81 mg Oral Daily   atorvastatin  40 mg Oral Daily   busPIRone  7.5 mg Oral BID   clopidogrel  75 mg Oral Daily   cyclobenzaprine  5 mg Oral BID   enoxaparin (LOVENOX) injection  40 mg Subcutaneous Q24H   gabapentin  400 mg Oral TID   insulin aspart  0-15 Units Subcutaneous TID WC   insulin glargine-yfgn  10 Units Subcutaneous Daily   pantoprazole  40 mg Oral Daily   rOPINIRole  0.25 mg Oral QHS   tamsulosin  0.4 mg Oral Daily   topiramate  25 mg Oral Daily   Continuous Infusions:  cefTRIAXone (ROCEPHIN)  IV 2 g (10/02/22 2140)   PRN Meds:.acetaminophen **OR** acetaminophen, albuterol, fentaNYL (SUBLIMAZE) injection, guaiFENesin-dextromethorphan, loratadine, magic mouthwash w/lidocaine, [DISCONTINUED] ondansetron **OR** ondansetron (ZOFRAN) IV    Objective:   Vitals:   10/03/22 0000 10/03/22 0200 10/03/22 0303 10/03/22 0400  BP: (!) 145/66  (!) 141/68 131/68  Pulse: (!) 101 (!) 103 95 93  Resp: 20 (!) 22 20 18   Temp: 98.6 F (37 C)  98.5 F (36.9 C)   TempSrc: Oral  Oral   SpO2: 90% 93% 92% 91%  Weight:      Height:        Wt Readings from Last 3 Encounters:  09/30/22 58.1 kg  05/29/22 64 kg  11/06/21 72.6 kg     Intake/Output Summary (Last 24 hours) at 10/03/2022 0803 Last data filed at 10/02/2022 2300 Gross per 24 hour  Intake 1600 ml  Output 1800 ml  Net -200 ml     Physical Exam  Awake Alert, No new F.N deficits, Normal affect [place].AT,PERRAL Supple Neck, No JVD,   Symmetrical  Chest wall movement, Good air movement bilaterally, CTAB RRR,No Gallops,Rubs or new Murmurs,  +ve B.Sounds, Abd Soft, No tenderness,   L AKA     Data Review:    Recent Labs  Lab 09/30/22 0625 10/01/22 0617 10/02/22 0305 10/03/22 0203  WBC 23.4* 17.0* 11.2* 8.8  HGB 11.2* 9.4* 9.2* 9.5*  HCT 35.4* 29.8* 28.9* 30.0*  PLT  336 276 245 256  MCV 92.7 96.8 94.8 95.8  MCH 29.3 30.5 30.2 30.4  MCHC 31.6 31.5 31.8 31.7  RDW 12.9 13.3 12.9 12.9  LYMPHSABS 1.5  --  1.9 1.4  MONOABS 1.7*  --  1.2* 0.9  EOSABS 0.0  --  0.3 0.3  BASOSABS 0.1  --  0.0 0.0    Recent Labs  Lab 09/30/22 0625 09/30/22 0830 10/01/22 0617 10/02/22 0305 10/03/22 0203  NA 131*  --  137 133* 137  K 4.0  --  3.9 4.1 4.3  CL 99  --  103 100 103  CO2 19*  --  26 24 22   ANIONGAP 13  --  8 9 12   GLUCOSE 239*  --  111* 147* 186*  BUN 19  --  16 19 16   CREATININE 1.18*  --  0.99 1.04* 1.01*  AST 24  --   --   --   --   ALT 22  --   --   --   --   ALKPHOS 76  --   --   --   --   BILITOT 0.9  --   --   --   --   ALBUMIN 3.3*  --   --   --   --   CRP  --   --  19.6* 13.0* 8.8*  PROCALCITON  --   --  6.23 4.03 2.07  LATICACIDVEN 2.6* 5.9*  --   --   --   INR 1.1  --   --   --   --   HGBA1C 6.6*  --   --   --   --   BNP  --   --  411.8* 360.9* 242.7*  MG  --   --  2.0 1.9 1.9  CALCIUM 8.7*  --  8.3* 8.0* 8.4*      Recent Labs  Lab 09/30/22 0625 09/30/22 0830 10/01/22 0617 10/02/22 0305 10/03/22 0203  CRP  --   --  19.6* 13.0* 8.8*  PROCALCITON  --   --  6.23 4.03 2.07  LATICACIDVEN 2.6* 5.9*  --   --   --   INR 1.1  --   --   --   --   HGBA1C 6.6*  --   --   --   --   BNP  --   --  411.8* 360.9* 242.7*  MG  --   --  2.0 1.9 1.9  CALCIUM 8.7*  --  8.3* 8.0* 8.4*      Radiology Reports DG C-Arm 1-60 Min  Result Date: 09/30/2022 CLINICAL DATA:  surgery, elective EXAM: DG C-ARM 1-60 MIN COMPARISON:  CT CAP, 09/30/2022. FLUOROSCOPY: Exposure Index (as provided by the fluoroscopic device): 9.3 mGy Kerma FINDINGS: Limited oblique planar images of the RIGHT collecting system obtained C-arm. Images demonstrating retrograde opacification of the RIGHT ureter, with contrast opacification of the RIGHT renal collecting system. Mild RIGHT pelviectasis, with transition at the ureteropelvic junction. No evidence of filling defect is  demonstrated. IMPRESSION: Fluoroscopic imaging for intraoperative RIGHT retrograde nephrostogram. Mild RIGHT pelviectasis  with transition at the UPJ. For complete description of intra procedural findings, please see performing service dictation. Electronically Signed   By: Roanna Banning M.D.   On: 09/30/2022 15:25   CT CHEST ABDOMEN PELVIS W CONTRAST  Result Date: 09/30/2022 CLINICAL DATA:  Sepsis. EXAM: CT CHEST, ABDOMEN, AND PELVIS WITH CONTRAST TECHNIQUE: Multidetector CT imaging of the chest, abdomen and pelvis was performed following the standard protocol during bolus administration of intravenous contrast. RADIATION DOSE REDUCTION: This exam was performed according to the departmental dose-optimization program which includes automated exposure control, adjustment of the mA and/or kV according to patient size and/or use of iterative reconstruction technique. CONTRAST:  75mL OMNIPAQUE IOHEXOL 350 MG/ML SOLN COMPARISON:  CT angio chest 08/02/2020 and CT AP from 06/23/2014 FINDINGS: CT CHEST FINDINGS Cardiovascular: Heart size is normal. No pericardial effusion. Aortic atherosclerosis and coronary artery calcification. Mediastinum/Nodes: Thyroid gland, trachea, and esophagus are unremarkable. No enlarged mediastinal or hilar lymph nodes. Lungs/Pleura: No pleural fluid, pneumothorax or interstitial edema. Patchy ground-glass attenuation is noted within the superior segment of the left lower lobe, mild patchy ground-glass attenuation is scattered within the left lower lobe. Bibasilar peripheral subpleural reticulation is noted. No airspace consolidation. Small nodule in the lateral right apex is unchanged measuring 4 mm. 3 mm right middle lobe lung nodule is also stable. These are compatible with a benign process require no further follow-up. Musculoskeletal: No acute or suspicious osseous findings. CT ABDOMEN PELVIS FINDINGS Hepatobiliary: No focal liver abnormality is seen. Status post cholecystectomy. No  biliary dilatation. Pancreas: Unremarkable. No pancreatic ductal dilatation or surrounding inflammatory changes. Spleen: Normal in size without focal abnormality. Adrenals/Urinary Tract: Normal adrenal glands. Asymmetric right-sided pelvocaliectasis with dilated extrarenal pelvis to the UPJ. This is slightly progressive compared with the previous exam. No obstructing stone identified. Normal caliber right ureter. There is mild right-sided perinephric fat stranding which is new from the previous exam. Equivocal striated nephrograms within the right kidney noted on the delayed images. Unremarkable appearance of the left kidney with chronic left extrarenal pelvis. Tiny focus of gas noted within the non dependent portion of the bladder lumen. No focal bladder abnormality noted. Stomach/Bowel: Stomach appears normal. The appendix is not confidently identified. No secondary signs of acute appendicitis. No pathologic dilatation of large or small bowel loops. Mild stool burden is noted throughout the colon. Vascular/Lymphatic: Aortic atherosclerosis. No enlarged abdominopelvic lymph nodes. Increased multiplicity of normal size retroperitoneal lymph nodes identified. Reproductive: Uterus and bilateral adnexa are unremarkable. Other: No free fluid or fluid collections. Fat containing infraumbilical ventral abdominal wall hernias noted which measures 5.8 cm. No signs of pneumoperitoneum. Musculoskeletal: Multilevel degenerative disc disease noted throughout the lumbar spine. No acute or suspicious osseous findings. IMPRESSION: 1. Patchy ground-glass attenuation within the superior segment of the left lower lobe and left lower lobe, which may reflect mild atypical infection or inflammation. 2. Chronic, mildly progressive dilatation extrarenal to the level of the UPJ. Normal caliber right ureter. No obstructing stone identified. 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 3. Mild right-sided perinephric fat stranding with equivocal striated nephrograms within the right kidney. A small amount of gas is noted within the non dependent portion of the urinary bladder. Cannot exclude urinary tract infection. Correlation with urinalysis is recommended. 4. Fat containing infraumbilical ventral abdominal wall hernia. 5. Aortic Atherosclerosis (ICD10-I70.0). Electronically Signed   By: Signa Kell M.D.   On: 09/30/2022 10:13   DG Chest 2 View  Result  Date: 09/30/2022 CLINICAL DATA:  Sepsis suspected EXAM: CHEST - 2 VIEW COMPARISON:  04/14/2022 FINDINGS: The heart size and mediastinal contours are within normal limits. Both lungs are clear. The visualized skeletal structures are unremarkable. IMPRESSION: No active cardiopulmonary disease. Electronically Signed   By: Signa Kell M.D.   On: 09/30/2022 07:02      Signature  -   Susa Raring M.D on 10/03/2022 at 8:03 AM   -  To page go to [email or web address]

## 2022-10-04 ENCOUNTER — Inpatient Hospital Stay (HOSPITAL_COMMUNITY): Payer: Medicare (Managed Care)

## 2022-10-04 ENCOUNTER — Other Ambulatory Visit (HOSPITAL_COMMUNITY): Payer: Self-pay

## 2022-10-04 DIAGNOSIS — A419 Sepsis, unspecified organism: Secondary | ICD-10-CM | POA: Diagnosis not present

## 2022-10-04 DIAGNOSIS — N390 Urinary tract infection, site not specified: Secondary | ICD-10-CM | POA: Diagnosis not present

## 2022-10-04 LAB — GLUCOSE, CAPILLARY
Glucose-Capillary: 146 mg/dL — ABNORMAL HIGH (ref 70–99)
Glucose-Capillary: 152 mg/dL — ABNORMAL HIGH (ref 70–99)

## 2022-10-04 MED ORDER — INSULIN ASPART 100 UNIT/ML FLEXPEN
PEN_INJECTOR | SUBCUTANEOUS | 0 refills | Status: DC
  Filled 2022-10-04: qty 9, 30d supply, fill #0

## 2022-10-04 MED ORDER — TAMSULOSIN HCL 0.4 MG PO CAPS: 0.4000 mg | ORAL_CAPSULE | Freq: Every day | ORAL | 0 refills | Status: DC

## 2022-10-04 MED ORDER — METRONIDAZOLE 500 MG PO TABS: 500.0000 mg | ORAL_TABLET | Freq: Two times a day (BID) | ORAL | 0 refills | Status: DC

## 2022-10-04 MED ORDER — IPRATROPIUM-ALBUTEROL 0.5-2.5 (3) MG/3ML IN SOLN
RESPIRATORY_TRACT | 0 refills | Status: DC
  Filled 2022-10-04: qty 360, fill #0

## 2022-10-04 MED ORDER — METRONIDAZOLE 500 MG PO TABS
500.0000 mg | ORAL_TABLET | Freq: Two times a day (BID) | ORAL | Status: DC
  Administered 2022-10-04: 500 mg via ORAL
  Filled 2022-10-04: qty 1

## 2022-10-04 MED ORDER — CEPHALEXIN 500 MG PO CAPS
500.0000 mg | ORAL_CAPSULE | Freq: Three times a day (TID) | ORAL | 0 refills | Status: AC
  Filled 2022-10-04: qty 21, 7d supply, fill #0

## 2022-10-04 MED ORDER — METHYLPREDNISOLONE 4 MG PO TBPK: ORAL_TABLET | ORAL | 0 refills | Status: DC

## 2022-10-04 MED ORDER — CEPHALEXIN 500 MG PO CAPS
500.0000 mg | ORAL_CAPSULE | Freq: Three times a day (TID) | ORAL | 0 refills | Status: DC
  Filled 2022-10-04: qty 21, 7d supply, fill #0

## 2022-10-04 MED ORDER — INSULIN ASPART 100 UNIT/ML FLEXPEN: PEN_INJECTOR | SUBCUTANEOUS | 0 refills | Status: DC

## 2022-10-04 MED ORDER — METRONIDAZOLE 500 MG PO TABS
500.0000 mg | ORAL_TABLET | Freq: Two times a day (BID) | ORAL | 0 refills | Status: DC
  Filled 2022-10-04: qty 10, 5d supply, fill #0

## 2022-10-04 MED ORDER — INSULIN ASPART 100 UNIT/ML FLEXPEN
PEN_INJECTOR | SUBCUTANEOUS | 0 refills | Status: DC
  Filled 2022-10-04: qty 15, fill #0

## 2022-10-04 MED ORDER — ALBUTEROL SULFATE HFA 108 (90 BASE) MCG/ACT IN AERS
1.0000 | INHALATION_SPRAY | Freq: Four times a day (QID) | RESPIRATORY_TRACT | 0 refills | Status: DC | PRN
  Filled 2022-10-04: qty 18, fill #0

## 2022-10-04 MED ORDER — INSULIN PEN NEEDLE 32G X 4 MM MISC
0 refills | Status: DC
  Filled 2022-10-04: qty 100, 30d supply, fill #0

## 2022-10-04 MED ORDER — TAMSULOSIN HCL 0.4 MG PO CAPS
0.4000 mg | ORAL_CAPSULE | Freq: Every day | ORAL | 0 refills | Status: DC
  Filled 2022-10-04: qty 30, 30d supply, fill #0

## 2022-10-04 MED ORDER — SODIUM CHLORIDE 0.9 % IV SOLN
2.0000 g | INTRAVENOUS | Status: DC
  Administered 2022-10-04: 2 g via INTRAVENOUS
  Filled 2022-10-04: qty 20

## 2022-10-04 MED ORDER — IPRATROPIUM-ALBUTEROL 0.5-2.5 (3) MG/3ML IN SOLN: RESPIRATORY_TRACT | 0 refills | Status: DC

## 2022-10-04 MED ORDER — CEPHALEXIN 500 MG PO CAPS: 500.0000 mg | ORAL_CAPSULE | Freq: Three times a day (TID) | ORAL | 0 refills | Status: DC

## 2022-10-04 MED ORDER — IPRATROPIUM-ALBUTEROL 0.5-2.5 (3) MG/3ML IN SOLN
RESPIRATORY_TRACT | 0 refills | Status: DC
  Filled 2022-10-04: qty 360, 15d supply, fill #0

## 2022-10-04 MED ORDER — METHYLPREDNISOLONE 4 MG PO TBPK
ORAL_TABLET | ORAL | 0 refills | Status: DC
  Filled 2022-10-04: qty 21, 6d supply, fill #0

## 2022-10-04 MED ORDER — METHYLPREDNISOLONE 4 MG PO TBPK
ORAL_TABLET | ORAL | 0 refills | Status: DC
  Filled 2022-10-04: qty 21, fill #0

## 2022-10-04 MED ORDER — ALBUTEROL SULFATE HFA 108 (90 BASE) MCG/ACT IN AERS: 1.0000 | INHALATION_SPRAY | Freq: Four times a day (QID) | RESPIRATORY_TRACT | 0 refills | Status: DC | PRN

## 2022-10-04 MED ORDER — PREDNISONE 20 MG PO TABS: 20.0000 mg | ORAL_TABLET | Freq: Every day | ORAL | Status: DC

## 2022-10-04 MED ORDER — ALBUTEROL SULFATE HFA 108 (90 BASE) MCG/ACT IN AERS
1.0000 | INHALATION_SPRAY | Freq: Four times a day (QID) | RESPIRATORY_TRACT | 0 refills | Status: DC | PRN
  Filled 2022-10-04: qty 18, 25d supply, fill #0

## 2022-10-04 NOTE — Discharge Instructions (Signed)
Follow with Primary MD Jethro Bastos, MD in 7 days   Get CBC, CMP, 2 view Chest X ray -  checked next visit with your primary MD    Activity: As tolerated with Full fall precautions use walker/cane & assistance as needed  Disposition Home    Diet: Heart Healthy Low Carb, check CBGs q. [employer] S  Special Instructions: If you have smoked or chewed Tobacco  in the last 2 yrs please stop smoking, stop any regular Alcohol  and or any Recreational drug use.  On your next visit with your primary care physician please Get Medicines reviewed and adjusted.  Please request your Prim.MD to go over all Hospital Tests and Procedure/Radiological results at the follow up, please get all Hospital records sent to your Prim MD by signing hospital release before you go home.  If you experience worsening of your admission symptoms, develop shortness of breath, life threatening emergency, suicidal or homicidal thoughts you must seek medical attention immediately by calling 911 or calling your MD immediately  if symptoms less severe.  You Must read complete instructions/literature along with all the possible adverse reactions/side effects for all the Medicines you take and that have been prescribed to you. Take any new Medicines after you have completely understood and accpet all the possible adverse reactions/side effects.

## 2022-10-04 NOTE — Progress Notes (Signed)
Patient discharge with [facility].

## 2022-10-04 NOTE — TOC Transition Note (Signed)
Transition of Care [facility]) - CM/SW Discharge Note   Patient Details  Name: Anna [name] MRN: [id number] Date of Birth: 08-03-1955  Transition of Care [facility]) CM/SW Contact:  Mearl Latin, LCSW Phone Number: 10/04/2022, 10:53 AM   Clinical Narrative:    Per Lawanna Kobus she is at work so [facility] is going to pick patient up at 12:30pm and take her to their day center. CSW provided them with RN station number upon arrival and alerted RN. No other needs identified.    Final next level of care: Home w Home Health Services Barriers to Discharge: Barriers Resolved   Patient Goals and CMS Choice      Discharge Placement                  Patient to be transferred to facility by: [facility] Name of family member notified: Lawanna Kobus Patient and family notified of of transfer: 10/04/22  Discharge Plan and Services Additional resources added to the After Visit Summary for   In-house Referral: Clinical Social Work Discharge Planning Services: CM Consult Post Acute Care Choice: Resumption of Svcs/PTA Provider ([facility])                      HH Agency:  ([facility])     Representative spoke with at [facility] Agency: Marchelle Folks  Social Determinants of Health (SDOH) Interventions SDOH Screenings   Food Insecurity: No Food Insecurity (09/30/2022)  Housing: Low Risk  (09/30/2022)  Transportation Needs: No Transportation Needs (09/30/2022)  Utilities: Not At Risk (09/30/2022)  Social Connections: Unknown (07/28/2021)   Received from [facility]  Tobacco Use: Low Risk  (10/01/2022)     Readmission Risk Interventions     No data to display

## 2022-10-04 NOTE — Progress Notes (Signed)
Discharge teaching completed. Patient to discharge to [facility] but to a day care called [facility]. Family aware.

## 2022-10-04 NOTE — Plan of Care (Signed)
  Problem: Fluid Volume: Goal: Ability to maintain a balanced intake and output will improve Outcome: Progressing   Problem: Health Behavior/Discharge Planning: Goal: Ability to identify and utilize available resources and services will improve Outcome: Progressing Goal: Ability to manage health-related needs will improve Outcome: Progressing   Problem: Metabolic: Goal: Ability to maintain appropriate glucose levels will improve Outcome: Progressing   Problem: Skin Integrity: Goal: Risk for impaired skin integrity will decrease Outcome: Progressing   Problem: Tissue Perfusion: Goal: Adequacy of tissue perfusion will improve Outcome: Progressing   Problem: Pain Managment: Goal: General experience of comfort will improve Outcome: Progressing

## 2022-10-04 NOTE — Discharge Summary (Signed)
Anna [name] ZOX:[id number] DOB: May 24, 1955 DOA: 09/30/2022  PCP: Jethro Bastos, MD  Admit date: 09/30/2022  Discharge date: 10/04/2022  Admitted From: Home   Disposition:  Home   Recommendations for Outpatient Follow-up:   Follow up with PCP in 1-2 weeks  PCP Please obtain BMP/CBC, 2 view CXR in 1week,  (see Discharge instructions)   PCP Please follow up on the following pending results: CBC, BMP, two-view chest x-ray next visit must follow-up with urology within a week of discharge.   Home Health: PT,OT   Equipment/Devices: Walker  Consultations: Urology Discharge Condition: Stable    CODE STATUS: Full    Diet Recommendation: Heart Healthy Low Carb    Chief Complaint  Patient presents with   Hypotension   Fever     Brief history of present illness from the day of admission and additional interim summary    67 y.o. female with medical history significant of T2DM, PAD, hyperlipidemia, hypertension, obesity, left AKA,  and asthma who presented with generalized weakness.  Diagnosed with right UPJ obstruction with pyelonephritis and sepsis and admitted to the hospital                                                                  Hospital Course   Severe sepsis due to right UPJ obstruction causing pyelonephritis, E. coli bacteremia. she has been seen by urology, underwent cystoscopy with right ureteral stent placement, right retrograde pyelography with interpretation on 09/30/2022 by Dr. Modena Slater, also has a Foley catheter.  Currently on IV fluids and empiric IV Rocephin x 6 doses, sepsis pathophysiology has completely resolved will get 1 more week of oral Keflex and Flagyl, discussed with Dr. Luciana Axe, has some cough and mild clinical signs of aspiration pneumonia, she tends to eat laying flat in bed she  has been encouraged not to do that multiple times.   PAD with left AKA.  Continue DAPT and statin for secondary prevention.   AKI due to sepsis.  Resolved.     Essential hypertension -now stable continue home regimen.   COPD mixed type (HCC) - Continue oxymetry monitoring, no signs of exacerbation, continue with bronchodilatory therapy.    Obesity OSA (obstructive sleep apnea) - Cpap at night, follow-up with PCP for weight loss.  Intermittently compliant counseled.   GERD (gastroesophageal reflux disease) - Continue pantoprazole.   Hyponatremia - due to dehydration resolved with IV fluids.   Type 2 diabetes mellitus (HCC) - on home regimen will add sliding scale for better control.  Discharge diagnosis     Principal Problem:   Sepsis secondary to UTI [facility]) Active Problems:   Essential hypertension   Type 2 diabetes mellitus (HCC)   COPD mixed type (HCC)   OSA (obstructive sleep apnea)   GERD (gastroesophageal reflux disease)  Discharge instructions    Discharge Instructions     Discharge instructions   Complete by: As directed    Follow with Primary MD Jethro Bastos, MD in 7 days   Get CBC, CMP, 2 view Chest X ray -  checked next visit with your primary MD    Activity: As tolerated with Full fall precautions use walker/cane & assistance as needed  Disposition Home    Diet: Heart Healthy Low Carb, check CBGs q. [facility]  Special Instructions: If you have smoked or chewed Tobacco  in the last 2 yrs please stop smoking, stop any regular Alcohol  and or any Recreational drug use.  On your next visit with your primary care physician please Get Medicines reviewed and adjusted.  Please request your Prim.MD to go over all Hospital Tests and Procedure/Radiological results at the follow up, please get all Hospital records sent to your Prim MD by signing hospital release before you go home.  If you experience worsening of your admission symptoms, develop shortness of  breath, life threatening emergency, suicidal or homicidal thoughts you must seek medical attention immediately by calling 911 or calling your MD immediately  if symptoms less severe.  You Must read complete instructions/literature along with all the possible adverse reactions/side effects for all the Medicines you take and that have been prescribed to you. Take any new Medicines after you have completely understood and accpet all the possible adverse reactions/side effects.   For home use only DME Nebulizer/meds   Complete by: As directed    Patient needs a nebulizer to treat with the following condition: COPD (chronic obstructive pulmonary disease) (HCC)   Length of Need: Lifetime   Increase activity slowly   Complete by: As directed        Discharge Medications   Allergies as of 10/04/2022       Reactions   Bacitracin    Other reaction(s): Other Burns skin   Citalopram Diarrhea   Doxycycline Other (See Comments)   Other reaction(s): Other "burning" all over body   Morphine And Codeine Hives   Neomycin    Unknown reaction Not listed on the MAR   Ciprofloxacin    Tongue swells   Ciprofloxacin-dexamethasone Other (See Comments)   unknown   Duloxetine    Other reaction(s): Mental Status Changes (intolerance) "saw pictures of gun"   Latex Itching   Levaquin [levofloxacin In D5w] Other (See Comments)   Pt does not remember reaction but states she's had issues with med   Piper    Other reaction(s): Other (See Comments) White pepper- uncontrollable sneezing    Salvia Officinalis    Sneezing Not listed on the [facility]   Soma [carisoprodol]    Sleepy and constipation Not listed on the [facility]   Lasix [furosemide] Rash        Medication List     TAKE these medications    acetaminophen 500 MG tablet Commonly known as: TYLENOL Take 1,000 mg by mouth 2 (two) times daily as needed for moderate pain.   albuterol 108 (90 Base) MCG/ACT inhaler Commonly known as: VENTOLIN  HFA Inhale 1-2 puffs into the lungs every 6 (six) hours as needed for wheezing or shortness of breath.   aspirin EC 81 MG tablet Take 81 mg by mouth daily. Swallow whole.   atorvastatin 40 MG tablet Commonly known as: LIPITOR Take 40 mg by mouth daily.   Biofreeze 4 % Gel Generic drug: Menthol (Topical Analgesic) Apply 1 application  topically  4 (four) times daily as needed (to right side back, neck, shoulder and hip for pain).   busPIRone 7.5 MG tablet Commonly known as: BUSPAR Take 7.5 mg by mouth 2 (two) times daily.   calamine lotion Apply 1 Application topically 3 (three) times daily as needed for itching (to insect bites on right breast and right upper back).   cephALEXin 500 MG capsule Commonly known as: KEFLEX Take 1 capsule (500 mg total) by mouth 3 (three) times daily for 7 days.   clopidogrel 75 MG tablet Commonly known as: PLAVIX Take 75 mg by mouth daily.   Culturelle Health & Wellness Caps Take 1 capsule by mouth daily.   cyclobenzaprine 5 MG tablet Commonly known as: FLEXERIL Take 5 mg by mouth in the morning and at bedtime.   docusate sodium 100 MG capsule Commonly known as: COLACE Take 100 mg by mouth 2 (two) times daily as needed for mild constipation.   Dulaglutide 3 MG/0.5ML Sopn Inject 3 mg into the skin once a week. Saturday   estradiol 0.1 MG/GM vaginal cream Commonly known as: ESTRACE Place 1 Applicatorful vaginally as needed (weekly as needed for dryness).   fluticasone 50 MCG/ACT nasal spray Commonly known as: FLONASE Place 1 spray into both nostrils daily as needed for allergies or rhinitis.   gabapentin 400 MG capsule Commonly known as: NEURONTIN Take 400 mg by mouth 3 (three) times daily.   hydrOXYzine 10 MG tablet Commonly known as: ATARAX Take 1 tablet (10 mg total) by mouth 2 (two) times daily. What changed:  when to take this reasons to take this   insulin aspart 100 UNIT/ML FlexPen Commonly known as: NOVOLOG Before each  meal 3 times a day, 140-199 - 2 units, 200-250 - 4 units, 251-299 - 6 units,  300-349 - 8 units,  350 or above 10 units.   insulin glargine 100 unit/mL Sopn Commonly known as: LANTUS Inject 25 Units into the skin at bedtime.   ipratropium-albuterol 0.5-2.5 (3) MG/3ML Soln Commonly known as: DUONEB Use twice a day scheduled and every 4 hours as needed for shortness of breath and wheezing   lidocaine 4 % Place 1 patch onto the skin daily as needed (pain).   lisinopril 5 MG tablet Commonly known as: ZESTRIL Take 5 mg by mouth daily.   loratadine 10 MG tablet Commonly known as: CLARITIN Take 10 mg by mouth daily as needed for allergies.   meclizine 12.5 MG tablet Commonly known as: ANTIVERT Take 12.5 mg by mouth daily as needed for dizziness.   metFORMIN 1000 MG tablet Commonly known as: GLUCOPHAGE Take 1,000 mg by mouth 2 (two) times daily with a meal.   methylPREDNISolone 4 MG Tbpk tablet Commonly known as: MEDROL DOSEPAK follow package directions   metroNIDAZOLE 500 MG tablet Commonly known as: FLAGYL Take 1 tablet (500 mg total) by mouth every 12 (twelve) hours.   multivitamin with minerals Tabs tablet Take 1 tablet by mouth daily.   naloxone 4 MG/0.1ML Liqd nasal spray kit Commonly known as: NARCAN Place 1 spray into the nose once as needed (for opiod overdose).   nystatin cream Commonly known as: MYCOSTATIN Apply 1 application. topically 4 (four) times daily as needed for dry skin.   ondansetron 4 MG tablet Commonly known as: ZOFRAN Take 4 mg by mouth every 8 (eight) hours as needed for nausea or vomiting.   OVER THE COUNTER MEDICATION Take 1 tablet by mouth daily as needed (concentration).   pantoprazole 40 MG tablet Commonly  known as: PROTONIX Take 40 mg by mouth daily.   polyethylene glycol 17 g packet Commonly known as: MIRALAX / GLYCOLAX Take 17 g by mouth daily as needed for moderate constipation.   rOPINIRole 0.25 MG tablet Commonly known as:  REQUIP Take 0.25 mg by mouth at bedtime.   sucralfate 1 GM/10ML suspension Commonly known as: CARAFATE Take 1 g by mouth 4 (four) times daily as needed (stomch ulcer).   tamsulosin 0.4 MG Caps capsule Commonly known as: FLOMAX Take 1 capsule (0.4 mg total) by mouth daily.   topiramate 25 MG tablet Commonly known as: TOPAMAX Take 1 tablet (25 mg total) by mouth daily.               Durable Medical Equipment  (From admission, onward)           Start     Ordered   10/04/22 0842  For home use only DME Walker rolling  Once       Comments: 5 wheel  Question Answer Comment  Walker: With 5 Inch Wheels   Patient needs a walker to treat with the following condition Weakness      10/04/22 0841   10/04/22 0000  For home use only DME Nebulizer/meds       Question Answer Comment  Patient needs a nebulizer to treat with the following condition COPD (chronic obstructive pulmonary disease) (HCC)   Length of Need Lifetime      10/04/22 0981             Contact information for follow-up providers     Jethro Bastos, MD. Schedule an appointment as soon as possible for a visit in 1 week(s).   Specialty: Family Medicine Contact information: [address] [place] [address] [phone]         Crista Elliot, MD. Schedule an appointment as soon as possible for a visit in 1 week(s).   Specialty: Urology Contact information: [address] [place] [address] [phone]              Contact information for after-discharge care     Destination     [facility] Preferred SNF .   Service: Skilled Nursing Contact information: [address] [place] [address] [phone]                     Major procedures and Radiology Reports - PLEASE review detailed and final reports thoroughly  -      DG C-Arm 1-60 Min  Result Date: 09/30/2022 CLINICAL DATA:  surgery, elective EXAM: DG C-ARM  1-60 MIN COMPARISON:  CT CAP, 09/30/2022. FLUOROSCOPY: Exposure Index (as provided by the fluoroscopic device): 9.3 mGy Kerma FINDINGS: Limited oblique planar images of the RIGHT collecting system obtained C-arm. Images demonstrating retrograde opacification of the RIGHT ureter, with contrast opacification of the RIGHT renal collecting system. Mild RIGHT pelviectasis, with transition at the ureteropelvic junction. No evidence of filling defect is demonstrated. IMPRESSION: Fluoroscopic imaging for intraoperative RIGHT retrograde nephrostogram. Mild RIGHT pelviectasis with transition at the UPJ. For complete description of intra procedural findings, please see performing service dictation. Electronically Signed   By: Roanna Banning M.D.   On: 09/30/2022 15:25   CT CHEST ABDOMEN PELVIS W CONTRAST  Result Date: 09/30/2022 CLINICAL DATA:  Sepsis. EXAM: CT CHEST, ABDOMEN, AND PELVIS WITH CONTRAST TECHNIQUE: Multidetector CT imaging of the chest, abdomen and pelvis was performed following the standard protocol during  bolus administration of intravenous contrast. RADIATION DOSE REDUCTION: This exam was performed according to the departmental dose-optimization program which includes automated exposure control, adjustment of the mA and/or kV according to patient size and/or use of iterative reconstruction technique. CONTRAST:  75mL OMNIPAQUE IOHEXOL 350 MG/ML SOLN COMPARISON:  CT angio chest 08/02/2020 and CT AP from 06/23/2014 FINDINGS: CT CHEST FINDINGS Cardiovascular: Heart size is normal. No pericardial effusion. Aortic atherosclerosis and coronary artery calcification. Mediastinum/Nodes: Thyroid gland, trachea, and esophagus are unremarkable. No enlarged mediastinal or hilar lymph nodes. Lungs/Pleura: No pleural fluid, pneumothorax or interstitial edema. Patchy ground-glass attenuation is noted within the superior segment of the left lower lobe, mild patchy ground-glass attenuation is scattered within the left lower  lobe. Bibasilar peripheral subpleural reticulation is noted. No airspace consolidation. Small nodule in the lateral right apex is unchanged measuring 4 mm. 3 mm right middle lobe lung nodule is also stable. These are compatible with a benign process require no further follow-up. Musculoskeletal: No acute or suspicious osseous findings. CT ABDOMEN PELVIS FINDINGS Hepatobiliary: No focal liver abnormality is seen. Status post cholecystectomy. No biliary dilatation. Pancreas: Unremarkable. No pancreatic ductal dilatation or surrounding inflammatory changes. Spleen: Normal in size without focal abnormality. Adrenals/Urinary Tract: Normal adrenal glands. Asymmetric right-sided pelvocaliectasis with dilated extrarenal pelvis to the UPJ. This is slightly progressive compared with the previous exam. No obstructing stone identified. Normal caliber right ureter. There is mild right-sided perinephric fat stranding which is new from the previous exam. Equivocal striated nephrograms within the right kidney noted on the delayed images. Unremarkable appearance of the left kidney with chronic left extrarenal pelvis. Tiny focus of gas noted within the non dependent portion of the bladder lumen. No focal bladder abnormality noted. Stomach/Bowel: Stomach appears normal. The appendix is not confidently identified. No secondary signs of acute appendicitis. No pathologic dilatation of large or small bowel loops. Mild stool burden is noted throughout the colon. Vascular/Lymphatic: Aortic atherosclerosis. No enlarged abdominopelvic lymph nodes. Increased multiplicity of normal size retroperitoneal lymph nodes identified. Reproductive: Uterus and bilateral adnexa are unremarkable. Other: No free fluid or fluid collections. Fat containing infraumbilical ventral abdominal wall hernias noted which measures 5.8 cm. No signs of pneumoperitoneum. Musculoskeletal: Multilevel degenerative disc disease noted throughout the lumbar spine. No acute or  suspicious osseous findings. IMPRESSION: 1. Patchy ground-glass attenuation within the superior segment of the left lower lobe and left lower lobe, which may reflect mild atypical infection or inflammation. 2. Chronic, mildly progressive dilatation extrarenal to the level of the UPJ. Normal caliber right ureter. No obstructing stone identified. 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 3. Mild right-sided perinephric fat stranding with equivocal striated nephrograms within the right kidney. A small amount of gas is noted within the non dependent portion of the urinary bladder. Cannot exclude urinary tract infection. Correlation with urinalysis is recommended. 4. Fat containing infraumbilical ventral abdominal wall hernia. 5. Aortic Atherosclerosis (ICD10-I70.0). Electronically Signed   By: Signa Kell M.D.   On: 09/30/2022 10:13   DG Chest 2 View  Result Date: 09/30/2022 CLINICAL DATA:  Sepsis suspected EXAM: CHEST - 2 VIEW COMPARISON:  04/14/2022 FINDINGS: The heart size and mediastinal contours are within normal limits. Both lungs are clear. The visualized skeletal structures are unremarkable. IMPRESSION: No active cardiopulmonary disease. Electronically Signed   By: Signa Kell M.D.   On: 09/30/2022 07:02    Micro Results    Recent Results (from the past 240 hour(s))  Culture,  blood (Routine x 2)     Status: None (Preliminary result)   Collection Time: 09/30/22  6:30 AM   Specimen: BLOOD LEFT FOREARM  Result Value Ref Range Status   Specimen Description BLOOD LEFT FOREARM  Final   Special Requests   Final    BOTTLES DRAWN AEROBIC AND ANAEROBIC Blood Culture adequate volume   Culture   Final    NO GROWTH 4 DAYS Performed at [facility], [address], [place], [place] [address]    Report Status PENDING  Incomplete  Culture, blood (Routine x 2)     Status: Abnormal   Collection Time: 09/30/22  6:30 AM   Specimen:  BLOOD  Result Value Ref Range Status   Specimen Description BLOOD LEFT ANTECUBITAL  Final   Special Requests   Final    BOTTLES DRAWN AEROBIC AND ANAEROBIC Blood Culture results may not be optimal due to an excessive volume of blood received in culture bottles   Culture  Setup Time   Final    GRAM NEGATIVE RODS AEROBIC BOTTLE ONLY CRITICAL RESULT CALLED TO, READ BACK BY AND VERIFIED WITH: PHARMD Janice Coffin [phone] [id number] BY Berline Chough MT Performed at [facility], [address] [place], [place] [address]    Culture ESCHERICHIA COLI (A)  Final   Report Status 10/03/2022 FINAL  Final   Organism ID, Bacteria ESCHERICHIA COLI  Final      Susceptibility   Escherichia coli - MIC*    AMPICILLIN >=32 RESISTANT Resistant     CEFEPIME <=0.12 SENSITIVE Sensitive     CEFTAZIDIME <=1 SENSITIVE Sensitive     CEFTRIAXONE 1 SENSITIVE Sensitive     CIPROFLOXACIN <=0.25 SENSITIVE Sensitive     GENTAMICIN <=1 SENSITIVE Sensitive     IMIPENEM <=0.25 SENSITIVE Sensitive     TRIMETH/SULFA <=20 SENSITIVE Sensitive     AMPICILLIN/SULBACTAM >=32 RESISTANT Resistant     PIP/TAZO 8 SENSITIVE Sensitive     * ESCHERICHIA COLI  Resp panel by RT-PCR (RSV, Flu A&B, Covid) Anterior Nasal Swab     Status: None   Collection Time: 09/30/22  6:30 AM   Specimen: Anterior Nasal Swab  Result Value Ref Range Status   SARS Coronavirus 2 by RT PCR NEGATIVE NEGATIVE Final   Influenza A by PCR NEGATIVE NEGATIVE Final   Influenza B by PCR NEGATIVE NEGATIVE Final    Comment: (NOTE) The Xpert Xpress SARS-CoV-2/FLU/RSV plus assay is intended as an aid in the diagnosis of influenza from Nasopharyngeal swab specimens and should not be used as a sole basis for treatment. Nasal washings and aspirates are unacceptable for Xpert Xpress SARS-CoV-2/FLU/RSV testing.  Fact Sheet for Patients: BloggerCourse.com  Fact Sheet for Healthcare Providers: SeriousBroker.it  This  test is not yet approved or cleared by the [place] FDA and has been authorized for detection and/or diagnosis of SARS-CoV-2 by FDA under an Emergency Use Authorization (EUA). This EUA will remain in effect (meaning this test can be used) for the duration of the COVID-19 declaration under Section 564(b)(1) of the Act, 21 U.S.C. section 360bbb-3(b)(1), unless the authorization is terminated or revoked.     Resp Syncytial Virus by PCR NEGATIVE NEGATIVE Final    Comment: (NOTE) Fact Sheet for Patients: BloggerCourse.com  Fact Sheet for Healthcare Providers: SeriousBroker.it  This test is not yet approved or cleared by the [place] FDA and has been authorized for detection and/or diagnosis of SARS-CoV-2 by FDA under an Emergency Use Authorization (EUA). This EUA  will remain in effect (meaning this test can be used) for the duration of the COVID-19 declaration under Section 564(b)(1) of the Act, 21 U.S.C. section 360bbb-3(b)(1), unless the authorization is terminated or revoked.  Performed at [facility], [address] [place], [place] [address]   Blood Culture ID Panel (Reflexed)     Status: Abnormal   Collection Time: 09/30/22  6:30 AM  Result Value Ref Range Status   Enterococcus faecalis NOT DETECTED NOT DETECTED Final   Enterococcus Faecium NOT DETECTED NOT DETECTED Final   Listeria monocytogenes NOT DETECTED NOT DETECTED Final   Staphylococcus species NOT DETECTED NOT DETECTED Final   Staphylococcus aureus (BCID) NOT DETECTED NOT DETECTED Final   Staphylococcus epidermidis NOT DETECTED NOT DETECTED Final   Staphylococcus lugdunensis NOT DETECTED NOT DETECTED Final   Streptococcus species NOT DETECTED NOT DETECTED Final   Streptococcus agalactiae NOT DETECTED NOT DETECTED Final   Streptococcus pneumoniae NOT DETECTED NOT DETECTED Final   Streptococcus pyogenes NOT DETECTED NOT DETECTED Final    A.calcoaceticus-baumannii NOT DETECTED NOT DETECTED Final   Bacteroides fragilis NOT DETECTED NOT DETECTED Final   Enterobacterales DETECTED (A) NOT DETECTED Final    Comment: Enterobacterales represent a large order of gram negative bacteria, not a single organism. CRITICAL RESULT CALLED TO, READ BACK BY AND VERIFIED WITH: PHARMD LAUREN BELL [phone] [id number] BY J RAZZAK MT    Enterobacter cloacae complex NOT DETECTED NOT DETECTED Final   Escherichia coli DETECTED (A) NOT DETECTED Final    Comment: CRITICAL RESULT CALLED TO, READ BACK BY AND VERIFIED WITH: PHARMD LAUREN BELL [phone] [id number] BY J RAZZAK MT    Klebsiella aerogenes NOT DETECTED NOT DETECTED Final   Klebsiella oxytoca NOT DETECTED NOT DETECTED Final   Klebsiella pneumoniae NOT DETECTED NOT DETECTED Final   Proteus species NOT DETECTED NOT DETECTED Final   Salmonella species NOT DETECTED NOT DETECTED Final   Serratia marcescens NOT DETECTED NOT DETECTED Final   Haemophilus influenzae NOT DETECTED NOT DETECTED Final   Neisseria meningitidis NOT DETECTED NOT DETECTED Final   Pseudomonas aeruginosa NOT DETECTED NOT DETECTED Final   Stenotrophomonas maltophilia NOT DETECTED NOT DETECTED Final   Candida albicans NOT DETECTED NOT DETECTED Final   Candida auris NOT DETECTED NOT DETECTED Final   Candida glabrata NOT DETECTED NOT DETECTED Final   Candida krusei NOT DETECTED NOT DETECTED Final   Candida parapsilosis NOT DETECTED NOT DETECTED Final   Candida tropicalis NOT DETECTED NOT DETECTED Final   Cryptococcus neoformans/gattii NOT DETECTED NOT DETECTED Final   CTX-M ESBL NOT DETECTED NOT DETECTED Final   Carbapenem resistance IMP NOT DETECTED NOT DETECTED Final   Carbapenem resistance KPC NOT DETECTED NOT DETECTED Final   Carbapenem resistance NDM NOT DETECTED NOT DETECTED Final   Carbapenem resist OXA 48 LIKE NOT DETECTED NOT DETECTED Final   Carbapenem resistance VIM NOT DETECTED NOT DETECTED Final    Comment: Performed at  [facility], [address] [place], [place] [address]  Urine Culture     Status: Abnormal   Collection Time: 09/30/22  9:00 AM   Specimen: Urine, Random  Result Value Ref Range Status   Specimen Description URINE, RANDOM  Final   Special Requests URINE, CLEAN CATCH  Final   Culture (A)  Final    <10,000 COLONIES/mL INSIGNIFICANT GROWTH Performed at [facility], [address] [place], [place] [address]    Report Status 10/01/2022 FINAL  Final  Urine Culture  Status: None   Collection Time: 09/30/22  2:30 PM   Specimen: Urine, Cystoscope  Result Value Ref Range Status   Specimen Description PELVIS  Final   Special Requests RIGHT RENAL, UR  Final   Culture   Final    NO GROWTH Performed at [facility], [address], [place], [place] [address]    Report Status 10/01/2022 FINAL  Final  MRSA Next Gen by PCR, Nasal     Status: None   Collection Time: 10/01/22  5:54 AM   Specimen: Nasal Mucosa; Nasal Swab  Result Value Ref Range Status   MRSA by PCR Next Gen NOT DETECTED NOT DETECTED Final    Comment: (NOTE) The GeneXpert MRSA Assay (FDA approved for NASAL specimens only), is one component of a comprehensive MRSA colonization surveillance program. It is not intended to diagnose MRSA infection nor to guide or monitor treatment for MRSA infections. Test performance is not FDA approved in patients less than 74 years old. Performed at [facility], [address] [place], [place] [address]   Group A Strep by PCR     Status: None   Collection Time: 10/02/22  3:56 PM   Specimen: Throat; Sterile Swab  Result Value Ref Range Status   Group A Strep by PCR NOT DETECTED NOT DETECTED Final    Comment: Performed at [facility], [address] [place], [place] [address]  Culture, blood (Routine X 2) w Reflex to ID Panel     Status: None (Preliminary result)   Collection Time: 10/03/22  7:58 AM   Specimen: BLOOD LEFT HAND  Result Value Ref Range  Status   Specimen Description BLOOD LEFT HAND  Final   Special Requests   Final    BOTTLES DRAWN AEROBIC AND ANAEROBIC Blood Culture adequate volume   Culture   Final    NO GROWTH < 24 HOURS Performed at [facility], [address] [place], [place] [address]    Report Status PENDING  Incomplete  Culture, blood (Routine X 2) w Reflex to ID Panel     Status: None (Preliminary result)   Collection Time: 10/03/22  7:59 AM   Specimen: BLOOD RIGHT ARM  Result Value Ref Range Status   Specimen Description BLOOD RIGHT ARM  Final   Special Requests   Final    BOTTLES DRAWN AEROBIC AND ANAEROBIC Blood Culture adequate volume   Culture   Final    NO GROWTH < 24 HOURS Performed at [facility], [address] [place], [place] [address]    Report Status PENDING  Incomplete    Today   Subjective    Anna [name] today has no headache,no chest abdominal pain,no new weakness tingling or numbness, feels much better wants to go home today.    Objective   Blood pressure (!) 146/66, pulse 79, temperature 98 F (36.7 C), temperature source Oral, resp. rate 15, height 5\' 2"  (1.575 m), weight 58.1 kg, SpO2 98%.   Intake/Output Summary (Last 24 hours) at 10/04/2022 0855 Last data filed at 10/04/2022 0800 Gross per 24 hour  Intake 2140 ml  Output 4500 ml  Net -2360 ml    Exam  Awake Alert, No new F.N deficits,    [place].AT,PERRAL Supple Neck,   Symmetrical Chest wall movement, Good air movement bilaterally, CTAB RRR,No Gallops,   +ve B.Sounds, Abd Soft, Non tender,  No Cyanosis, L.AKA   Data Review   Recent Labs  Lab  09/30/22 1610 10/01/22 0617 10/02/22 0305 10/03/22 0203  WBC 23.4* 17.0* 11.2* 8.8  HGB 11.2* 9.4* 9.2* 9.5*  HCT 35.4* 29.8* 28.9* 30.0*  PLT 336 276 245 256  MCV 92.7 96.8 94.8 95.8  MCH 29.3 30.5 30.2 30.4  MCHC 31.6 31.5 31.8 31.7  RDW 12.9 13.3 12.9 12.9  LYMPHSABS 1.5  --  1.9 1.4  MONOABS 1.7*  --  1.2* 0.9  EOSABS 0.0  --  0.3 0.3   BASOSABS 0.1  --  0.0 0.0    Recent Labs  Lab 09/30/22 0625 09/30/22 0830 10/01/22 0617 10/02/22 0305 10/03/22 0203  NA 131*  --  137 133* 137  K 4.0  --  3.9 4.1 4.3  CL 99  --  103 100 103  CO2 19*  --  26 24 22   ANIONGAP 13  --  8 9 12   GLUCOSE 239*  --  111* 147* 186*  BUN 19  --  16 19 16   CREATININE 1.18*  --  0.99 1.04* 1.01*  AST 24  --   --   --   --   ALT 22  --   --   --   --   ALKPHOS 76  --   --   --   --   BILITOT 0.9  --   --   --   --   ALBUMIN 3.3*  --   --   --   --   CRP  --   --  19.6* 13.0* 8.8*  PROCALCITON  --   --  6.23 4.03 2.07  LATICACIDVEN 2.6* 5.9*  --   --   --   INR 1.1  --   --   --   --   HGBA1C 6.6*  --   --   --   --   BNP  --   --  411.8* 360.9* 242.7*  MG  --   --  2.0 1.9 1.9  CALCIUM 8.7*  --  8.3* 8.0* 8.4*    Total Time in preparing paper work, data evaluation and todays exam - 35 minutes  Signature  -    Susa Raring M.D on 10/04/2022 at 8:55 AM   -  To page go to [email or web address]

## 2022-10-04 NOTE — TOC Progression Note (Addendum)
Transition of Care [facility]) - Progression Note    Patient Details  Name: Anna [name] MRN: [id number] Date of Birth: [birth year]  Transition of Care [facility] CM/SW Contact  Mearl Latin, LCSW Phone Number: 10/04/2022, 8:58 AM  Clinical Narrative:    8:58am-CSW left voicemail for [facility] social worker [phone] Darrol Jump to determine patient's discharge plan.   9:30am-CSW spoke with Marchelle Folks at [facility] [phone]. She reported today would have been patient's last day at SNF so the patient will be discharging home. She stated she spoke with Lawanna Kobus and the plan is for Lawanna Kobus to transport to a motel (patient previously homeless). [facility] will arrange any needed DME and home services, they request DC Summary be faxed to [phone] and scripts be sent electronically. CSW faxed summary. RNCM attempting to get in touch with patient's granddaughter.         Expected Discharge Plan and Services         Expected Discharge Date: 10/04/22                                     Social Determinants of Health (SDOH) Interventions SDOH Screenings   Food Insecurity: No Food Insecurity (09/30/2022)  Housing: Low Risk  (09/30/2022)  Transportation Needs: No Transportation Needs (09/30/2022)  Utilities: Not At Risk (09/30/2022)  Social Connections: Unknown (07/28/2021)   Received from [facility]  Tobacco Use: Low Risk  (10/01/2022)    Readmission Risk Interventions     No data to display

## 2022-10-05 LAB — CULTURE, BLOOD (ROUTINE X 2)
Culture: NO GROWTH
Special Requests: ADEQUATE

## 2022-10-08 LAB — CULTURE, BLOOD (ROUTINE X 2)
Culture: NO GROWTH
Culture: NO GROWTH
Special Requests: ADEQUATE
Special Requests: ADEQUATE

## 2022-10-15 ENCOUNTER — Other Ambulatory Visit: Payer: Self-pay | Admitting: *Deleted

## 2022-10-15 DIAGNOSIS — R0989 Other specified symptoms and signs involving the circulatory and respiratory systems: Secondary | ICD-10-CM

## 2022-10-15 DIAGNOSIS — I70262 Atherosclerosis of native arteries of extremities with gangrene, left leg: Secondary | ICD-10-CM

## 2022-10-23 ENCOUNTER — Ambulatory Visit (INDEPENDENT_AMBULATORY_CARE_PROVIDER_SITE_OTHER): Payer: Medicare (Managed Care) | Admitting: Podiatry

## 2022-10-23 ENCOUNTER — Encounter: Payer: Self-pay | Admitting: Podiatry

## 2022-10-23 DIAGNOSIS — Z794 Long term (current) use of insulin: Secondary | ICD-10-CM

## 2022-10-23 DIAGNOSIS — I739 Peripheral vascular disease, unspecified: Secondary | ICD-10-CM

## 2022-10-23 DIAGNOSIS — E1152 Type 2 diabetes mellitus with diabetic peripheral angiopathy with gangrene: Secondary | ICD-10-CM

## 2022-10-23 DIAGNOSIS — B351 Tinea unguium: Secondary | ICD-10-CM

## 2022-10-23 NOTE — Progress Notes (Signed)
  Subjective:  Patient ID: Anna [name], female    DOB: October 18, 1955,  MRN: [id number]  Chief Complaint  Patient presents with   Diabetes    "Cut my toenails."    67 y.o. female presents with the above complaint. History confirmed with patient.   Overall doing well no new issues, blood sugars well-controlled.  Nails are thickened elongated causing discomfort again  Objective:  Physical Exam: warm, good capillary refill, no trophic changes or ulcerative lesions, and weakly palpable DP and PT pulse on the right side, foot is warm and well-perfused overall, nails thickened elongated yellow discolored with subungual debris, she has lesser hammertoe deformities that are reducible  Assessment:   1. Onychomycosis   2. Type 2 diabetes mellitus with diabetic peripheral angiopathy and gangrene, with long-term current use of insulin (HCC)   3. PAD (peripheral artery disease) (HCC)      Plan:  Patient was evaluated and treated and all questions answered.     Discussed the etiology and treatment options for the condition in detail with the patient.  Recommended debridement of the nails today. Sharp and mechanical debridement performed of all painful and mycotic nails today. Nails debrided in length and thickness using a nail nipper to level of comfort. Discussed treatment options including appropriate shoe gear. Follow up as needed for painful nails.  This is medically necessary due to her significant diabetes and PAD    Return in about 3 months (around 01/23/2023) for at risk foot care.

## 2022-10-25 ENCOUNTER — Inpatient Hospital Stay: Admission: RE | Admit: 2022-10-25 | Payer: Medicare (Managed Care) | Source: Ambulatory Visit

## 2022-11-05 ENCOUNTER — Ambulatory Visit: Payer: Medicare (Managed Care)

## 2022-11-05 ENCOUNTER — Ambulatory Visit (HOSPITAL_COMMUNITY): Admission: RE | Admit: 2022-11-05 | Payer: Medicare (Managed Care) | Source: Ambulatory Visit

## 2022-11-05 ENCOUNTER — Ambulatory Visit (HOSPITAL_COMMUNITY)
Admission: RE | Admit: 2022-11-05 | Discharge: 2022-11-05 | Disposition: A | Discharge status: Home / Self Care | Payer: Medicare (Managed Care) | Source: Ambulatory Visit | Attending: Surgery | Admitting: Surgery

## 2022-11-05 ENCOUNTER — Ambulatory Visit (INDEPENDENT_AMBULATORY_CARE_PROVIDER_SITE_OTHER)
Admission: RE | Admit: 2022-11-05 | Discharge: 2022-11-05 | Disposition: A | Discharge status: Home / Self Care | Payer: Medicare (Managed Care) | Source: Ambulatory Visit | Attending: Surgery | Admitting: Surgery

## 2022-11-05 ENCOUNTER — Ambulatory Visit (INDEPENDENT_AMBULATORY_CARE_PROVIDER_SITE_OTHER): Payer: Medicare (Managed Care) | Admitting: Physician Assistant

## 2022-11-05 ENCOUNTER — Encounter: Payer: Self-pay | Admitting: Physician Assistant

## 2022-11-05 DIAGNOSIS — I70262 Atherosclerosis of native arteries of extremities with gangrene, left leg: Secondary | ICD-10-CM

## 2022-11-05 DIAGNOSIS — R0989 Other specified symptoms and signs involving the circulatory and respiratory systems: Secondary | ICD-10-CM

## 2022-11-05 DIAGNOSIS — I739 Peripheral vascular disease, unspecified: Secondary | ICD-10-CM | POA: Diagnosis not present

## 2022-11-05 DIAGNOSIS — Z89512 Acquired absence of left leg below knee: Secondary | ICD-10-CM

## 2022-11-05 LAB — VAS US ABI WITH/WO TBI

## 2022-11-05 NOTE — Progress Notes (Signed)
Office Note     CC:  follow up Requesting Provider:  Jethro Bastos, MD  HPI: Anna [name] is a 67 y.o. (09/23/1955) female who presents for routine follow up of PAD and evaluation of carotid stenosis. 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 She says she is doing well regarding that. She had received her prosthesis in March of this year and she says she was doing really well with PT and walking with walker and some steps independent of walker, but then she was admitted to hospital in July with severe bladder and kidney infections and she says since she has not walked. She feels she has lost a lot of her strength. She has had rough social situation and currently is residing in hotel. Through help of social worker she is expecting to move into an apartment on 9/1 and through [facility] she will be able to get more days of therapy per week so she is hopeful to get back to ambulating. She currently has been having pain in her right groin. This is like a pulling pressure like sensation. She feels like her leg wants to "pull up" and spasm. She otherwise does not have any pain in the lower leg or foot. She does use leg to transfer and stand occasionally. She says mostly she is in wheel chair, regular armchair or bed. She has no non healing wounds. Her left BKA is doing well. She is medically managed on Aspirin, Statin, and Plavix.  She denies any visual changes, slurred speech, facial drooping, unilateral upper or lower extremity weakness or numbness.   The patient is a diabetic with associated neuropathy and retinopathy.  She is on a statin for hypercholesterolemia.  She is medically managed for hypertension.  She is a non-smoker.   Past Medical History:  Diagnosis Date   Arthritis    Asthma    Back pain    Bronchitis    Bursitis    Diabetes mellitus    Diabetic neuropathy (HCC)    Diabetic retinopathy    Hyperlipemia    Hypertension    Migraine    Obesity     Sepsis due to urinary tract infection (HCC)    Stroke [facility])    2020    Past Surgical History:  Procedure Laterality Date   ABDOMINAL AORTOGRAM W/LOWER EXTREMITY Left 08/22/2021   Procedure: ABDOMINAL AORTOGRAM W/LOWER EXTREMITY;  Surgeon: Nada Libman, MD;  Location: [facility] INVASIVE CV LAB;  Service: Cardiovascular;  Laterality: Left;   AMPUTATION Left 10/11/2021   Procedure: LEFT BELOW KNEE AMPUTATION;  Surgeon: Nadara Mustard, MD;  Location: [facility] OR;  Service: Orthopedics;  Laterality: Left;   CESAREAN SECTION     x3   CHOLECYSTECTOMY     CYSTOSCOPY WITH STENT PLACEMENT Right 09/30/2022   Procedure: CYSTOSCOPY WITH STENT PLACEMENT;  Surgeon: Crista Elliot, MD;  Location: [facility] OR;  Service: Urology;  Laterality: Right;   EYE SURGERY Left    METATARSAL HEAD EXCISION Left 08/24/2021   Procedure: METATARSAL HEAD EXCISION FIFTH TOE;  Surgeon: Vivi Barrack, DPM;  Location: [facility] OR;  Service: Podiatry;  Laterality: Left;   PERIPHERAL VASCULAR BALLOON ANGIOPLASTY  08/22/2021   Procedure: PERIPHERAL VASCULAR BALLOON ANGIOPLASTY;  Surgeon: Nada Libman, MD;  Location: [facility]VAS[facility] CV LAB;  Service: Cardiovascular;;  Left AT   WOUND DEBRIDEMENT Left 08/24/2021   Procedure: DEBRIDEMENT WOUND OF LEFT FOOT;  Surgeon: Vivi Barrack, DPM;  Location: [facility] OR;  Service: Podiatry;  Laterality: Left;    Social History   Socioeconomic History   Marital status: Divorced    Spouse name: Not on file   Number of children: Not on file   Years of education: Not on file   Highest education level: Not on file  Occupational History   Not on file  Tobacco Use   Smoking status: Never   Smokeless tobacco: Never  Substance and Sexual Activity   Alcohol use: No   Drug use: No   Sexual activity: Not on file  Other Topics Concern   Not on file  Social History Narrative   Not on file   Social Determinants of Health   Financial Resource Strain: Not on file  Food Insecurity: No Food Insecurity  (09/30/2022)   Hunger Vital Sign    Worried About Running Out of Food in the Last Year: Never true    Ran Out of Food in the Last Year: Never true  Transportation Needs: No Transportation Needs (09/30/2022)   PRAPARE - Administrator, Civil Service (Medical): No    Lack of Transportation (Non-Medical): No  Physical Activity: Not on file  Stress: Not on file  Social Connections: Unknown (07/28/2021)   [facility] from [facility]   Social Network    Social Network: Not on file  Intimate Partner Violence: Not At Risk (09/30/2022)   Humiliation, Afraid, Rape, and Kick questionnaire    Fear of Current or Ex-Partner: No    Emotionally Abused: No    Physically Abused: No    Sexually Abused: No   No family history on file.  Current Outpatient Medications  Medication Sig Dispense Refill   acetaminophen (TYLENOL) 500 MG tablet Take 1,000 mg by mouth 2 (two) times daily as needed for moderate pain.     albuterol (VENTOLIN HFA) 108 (90 Base) MCG/ACT inhaler Inhale 1-2 puffs into the lungs every 6 (six) hours as needed for wheezing or shortness of breath. 18 g 0   aspirin EC 81 MG tablet Take 81 mg by mouth daily. Swallow whole.     atorvastatin (LIPITOR) 40 MG tablet Take 40 mg by mouth daily.     busPIRone (BUSPAR) 7.5 MG tablet Take 7.5 mg by mouth 2 (two) times daily.     calamine lotion Apply 1 Application topically 3 (three) times daily as needed for itching (to insect bites on right breast and right upper back).     clopidogrel (PLAVIX) 75 MG tablet Take 75 mg by mouth daily.     cyclobenzaprine (FLEXERIL) 5 MG tablet Take 5 mg by mouth in the morning and at bedtime.     docusate sodium (COLACE) 100 MG capsule Take 100 mg by mouth 2 (two) times daily as needed for mild constipation.     Dulaglutide 3 MG/0.5ML SOPN Inject 3 mg into the skin once a week. Saturday     estradiol (ESTRACE) 0.1 MG/GM vaginal cream Place 1 Applicatorful vaginally as needed (weekly as needed  for dryness).     fluticasone (FLONASE) 50 MCG/ACT nasal spray Place 1 spray into both nostrils daily as needed for allergies or rhinitis.     gabapentin (NEURONTIN) 400 MG capsule Take 400 mg by mouth 3 (three) times daily.     hydrOXYzine (ATARAX) 10 MG tablet Take 1 tablet (10 mg total) by mouth 2 (two) times daily. (Patient taking differently: Take 10 mg by mouth 2 (two) times daily as needed for itching.) 30 tablet 0  insulin aspart (NOVOLOG) 100 UNIT/ML FlexPen Use as directed before each meal 3 times a day: 140-199 - 2 units, 200-250 - 4 units, 251-299 - 6 units,  300-349 - 8 units,  350 or above 10 units. 15 mL 0   insulin glargine (LANTUS) 100 unit/mL SOPN Inject 25 Units into the skin at bedtime.     Insulin Pen Needle 32G X 4 MM MISC Use to inject insulin as directed 100 each 0   ipratropium-albuterol (DUONEB) 0.5-2.5 (3) MG/3ML SOLN Use 3 ml by nebulization twice a day scheduled and every 4 hours as needed for shortness of breath and wheezing 360 mL 0   Lactobacillus Rhamnosus, GG, (CULTURELLE HEALTH & WELLNESS) CAPS Take 1 capsule by mouth daily.     lidocaine 4 % Place 1 patch onto the skin daily as needed (pain).     lisinopril (ZESTRIL) 5 MG tablet Take 5 mg by mouth daily.     loratadine (CLARITIN) 10 MG tablet Take 10 mg by mouth daily as needed for allergies.     meclizine (ANTIVERT) 12.5 MG tablet Take 12.5 mg by mouth daily as needed for dizziness.     Menthol, Topical Analgesic, (BIOFREEZE) 4 % GEL Apply 1 application  topically 4 (four) times daily as needed (to right side back, neck, shoulder and hip for pain).     metFORMIN (GLUCOPHAGE) 1000 MG tablet Take 1,000 mg by mouth 2 (two) times daily with a meal.     methylPREDNISolone (MEDROL DOSEPAK) 4 MG TBPK tablet follow package directions (Patient not taking: Reported on 10/23/2022) 21 tablet 0   metroNIDAZOLE (FLAGYL) 500 MG tablet Take 1 tablet (500 mg total) by mouth every 12 (twelve) hours. 10 tablet 0   Multiple Vitamin  (MULTIVITAMIN WITH MINERALS) TABS tablet Take 1 tablet by mouth daily.     naloxone (NARCAN) nasal spray 4 mg/0.1 mL Place 1 spray into the nose once as needed (for opiod overdose).     nystatin cream (MYCOSTATIN) Apply 1 application. topically 4 (four) times daily as needed for dry skin.     ondansetron (ZOFRAN) 4 MG tablet Take 4 mg by mouth every 8 (eight) hours as needed for nausea or vomiting.     OVER THE COUNTER MEDICATION Take 1 tablet by mouth daily as needed (concentration).     pantoprazole (PROTONIX) 40 MG tablet Take 40 mg by mouth daily.     polyethylene glycol (MIRALAX / GLYCOLAX) 17 g packet Take 17 g by mouth daily as needed for moderate constipation.     rOPINIRole (REQUIP) 0.25 MG tablet Take 0.25 mg by mouth at bedtime.     sucralfate (CARAFATE) 1 GM/10ML suspension Take 1 g by mouth 4 (four) times daily as needed (stomch ulcer).     tamsulosin (FLOMAX) 0.4 MG CAPS capsule Take 1 capsule (0.4 mg total) by mouth daily. 30 capsule 0   topiramate (TOPAMAX) 25 MG tablet Take 1 tablet (25 mg total) by mouth daily. 30 tablet 0   No current facility-administered medications for this visit.    Allergies  Allergen Reactions   Bacitracin     Other reaction(s): Other Burns skin     Citalopram Diarrhea   Doxycycline Other (See Comments)    Other reaction(s): Other "burning" all over body     Morphine And Codeine Hives   Neomycin     Unknown reaction Not listed on the [facility]    Ciprofloxacin     Tongue swells   Ciprofloxacin-Dexamethasone Other (See Comments)  unknown    Duloxetine     Other reaction(s): Mental Status Changes (intolerance) "saw pictures of gun"   Latex Itching   Levaquin [Levofloxacin In D5w] Other (See Comments)    Pt does not remember reaction but states she's had issues with med   Piper     Other reaction(s): Other (See Comments) White pepper- uncontrollable sneezing    Salvia Officinalis     Sneezing Not listed on the [facility]   Soma  [Carisoprodol]     Sleepy and constipation  Not listed on the [facility]   Lasix [Furosemide] Rash     REVIEW OF SYSTEMS:  [X]  denotes positive finding, [ ]  denotes negative finding Cardiac  Comments:  Chest pain or chest pressure:    Shortness of breath upon exertion:    Short of breath when lying flat:    Irregular heart rhythm:        Vascular    Pain in calf, thigh, or hip brought on by ambulation:    Pain in feet at night that wakes you up from your sleep:     Blood clot in your veins:    Leg swelling:         Pulmonary    Oxygen at home:    Productive cough:     Wheezing:         Neurologic    Sudden weakness in arms or legs:     Sudden numbness in arms or legs:     Sudden onset of difficulty speaking or slurred speech:    Temporary loss of vision in one eye:     Problems with dizziness:         Gastrointestinal    Blood in stool:     Vomited blood:         Genitourinary    Burning when urinating:     Blood in urine:        Psychiatric    Major depression:         Hematologic    Bleeding problems:    Problems with blood clotting too easily:        Skin    Rashes or ulcers:        Constitutional    Fever or chills:      PHYSICAL EXAMINATION:  Vitals:   11/05/22 1420  BP: 108/76  Pulse: 85  Resp: 18  Temp: 98.2 F (36.8 C)  TempSrc: Temporal  SpO2: 97%  Weight: 120 lb (54.4 kg)  Height: 5\' 1"  (1.549 m)    General:  WDWN in NAD; vital signs documented above Gait: Not observed, in wheel chair HENT: WNL, normocephalic Pulmonary: normal non-labored breathing Cardiac: regular HR Abdomen: soft Vascular Exam/Pulses: 2+ femoral pulses, no palpable distal pulses. Right foot warm and well perfused. Left BKA well healed Extremities: without ischemic changes, without Gangrene , without cellulitis; without open wounds;  Musculoskeletal: no muscle wasting or atrophy  Neurologic: A&O X 3 Psychiatric:  The pt has Normal affect.   Non-Invasive Vascular  Imaging:     +-------+-----------+-----------+------------+------------+ ABI/TBIToday's ABIToday's TBIPrevious ABIPrevious TBI +-------+-----------+-----------+------------+------------+ Right  [place]         0.25       [place]          0.22         +-------+-----------+-----------+------------+------------+ Left   BKA        BKA        [place]          absent       +-------+-----------+-----------+------------+------------+  VAS US Carotid duplex: Summary:  Right Carotid: Velocities in the right ICA are consistent with a 1-39% stenosis.   Left Carotid: Velocities in the left ICA are consistent with a 1-39% stenosis.   Vertebrals: Bilateral vertebral arteries demonstrate antegrade flow.    ASSESSMENT/PLAN:: 67 y.o. female here for routine follow up of PAD and evaluation of carotid stenosis. 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 She says she is doing well regarding that. Her left BKA has healed well. She is without any claudication, rest pain or tissue loss on right leg. She does not do much ambulation at this time following recent hospitalization.. She is hopeful to get back to using her prosthesis and walking more with walker. She is having some right groin pain but I think this is likely more to do with scar tissue or her inguinal ligament being tight. I suspect that when she gets back into her PT that this will improve. She is without any TIA or stroke like symptoms - ABI today is non compressible. TBI stable. This is unchanged form last year - Carotid duplex shows 1-39 % bilaterally. Normal flow in the vertebral arteries - encourage her to exercise / stay as mobile as she can - continue Aspirin, statin and Plavix - She will follow up in 1 year with ABI and Carotid duplex   Graceann Congress, PA-C [facility] [phone]  Clinic MD:   Myra Gianotti

## 2022-11-05 NOTE — Progress Notes (Deleted)
Office Note     CC:  follow up Requesting Provider:  Jethro Bastos, MD  HPI: Anna [name] is a 67 y.o. (December 02, 1955) female who presents for routine follow up of PAD and evaluation of carotid stenosis. 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 She says she is doing well regarding that. She had received her prosthesis in March of this year and she says she was doing really well with PT and walking with walker and some steps independent of walker, but then she was admitted to hospital in July with severe bladder and kidney infections and she says since she has not walked. She feels she has lost a lot of her strength. She has had rough social situation and currently is residing in hotel. Through help of social worker she is expecting to move into an apartment on 9/1 and through [facility] she will be able to get more days of therapy per week so she is hopeful to get back to ambulating. She currently has been having pain in her right groin. This is like a pulling pressure like sensation. She feels like her leg wants to "pull up" and spasm. She otherwise does not have any pain in the lower leg or foot. She does use leg to transfer and stand occasionally. She says mostly she is in wheel chair, regular armchair or bed. She has no non healing wounds. Her left BKA is doing well. She is medically managed on Aspirin, Statin, and Plavix.  She denies any visual changes, slurred speech, facial drooping, unilateral upper or lower extremity weakness or numbness.   The patient is a diabetic with associated neuropathy and retinopathy.  She is on a statin for hypercholesterolemia.  She is medically managed for hypertension.  She is a non-smoker.   Past Medical History:  Diagnosis Date   Arthritis    Asthma    Back pain    Bronchitis    Bursitis    Diabetes mellitus    Diabetic neuropathy (HCC)    Diabetic retinopathy    Hyperlipemia    Hypertension    Migraine    Obesity     Sepsis due to urinary tract infection (HCC)    Stroke [facility]    2020    Past Surgical History:  Procedure Laterality Date   ABDOMINAL AORTOGRAM W/LOWER EXTREMITY Left 08/22/2021   Procedure: ABDOMINAL AORTOGRAM W/LOWER EXTREMITY;  Surgeon: Nada Libman, MD;  Location: [facility];  Service: Cardiovascular;  Laterality: Left;   AMPUTATION Left 10/11/2021   Procedure: LEFT BELOW KNEE AMPUTATION;  Surgeon: Nadara Mustard, MD;  Location: [facility] OR;  Service: Orthopedics;  Laterality: Left;   CESAREAN SECTION     x3   CHOLECYSTECTOMY     CYSTOSCOPY WITH STENT PLACEMENT Right 09/30/2022   Procedure: CYSTOSCOPY WITH STENT PLACEMENT;  Surgeon: Crista Elliot, MD;  Location: [facility] OR;  Service: Urology;  Laterality: Right;   EYE SURGERY Left    METATARSAL HEAD EXCISION Left 08/24/2021   Procedure: METATARSAL HEAD EXCISION FIFTH TOE;  Surgeon: Vivi Barrack, DPM;  Location: [facility] OR;  Service: Podiatry;  Laterality: Left;   PERIPHERAL VASCULAR BALLOON ANGIOPLASTY  08/22/2021   Procedure: PERIPHERAL VASCULAR BALLOON ANGIOPLASTY;  Surgeon: Nada Libman, MD;  Location: [facility];  Service: Cardiovascular;;  Left AT   WOUND DEBRIDEMENT Left 08/24/2021   Procedure: DEBRIDEMENT WOUND OF LEFT FOOT;  Surgeon: Vivi Barrack, DPM;  Location: [facility] OR;  Service: Podiatry;  Laterality: Left;    Social History   Socioeconomic History   Marital status: Divorced    Spouse name: Not on file   Number of children: Not on file   Years of education: Not on file   Highest education level: Not on file  Occupational History   Not on file  Tobacco Use   Smoking status: Never   Smokeless tobacco: Never  Substance and Sexual Activity   Alcohol use: No   Drug use: No   Sexual activity: Not on file  Other Topics Concern   Not on file  Social History Narrative   Not on file   Social Determinants of Health   Financial Resource Strain: Not on file  Food Insecurity: No Food Insecurity  (09/30/2022)   Hunger Vital Sign    Worried About Running Out of Food in the Last Year: Never true    Ran Out of Food in the Last Year: Never true  Transportation Needs: No Transportation Needs (09/30/2022)   PRAPARE - Administrator, Civil Service (Medical): No    Lack of Transportation (Non-Medical): No  Physical Activity: Not on file  Stress: Not on file  Social Connections: Unknown (07/28/2021)   [facility] from [facility]   Social Network    Social Network: Not on file  Intimate Partner Violence: Not At Risk (09/30/2022)   Humiliation, Afraid, Rape, and Kick questionnaire    Fear of Current or Ex-Partner: No    Emotionally Abused: No    Physically Abused: No    Sexually Abused: No   No family history on file.  Current Outpatient Medications  Medication Sig Dispense Refill   acetaminophen (TYLENOL) 500 MG tablet Take 1,000 mg by mouth 2 (two) times daily as needed for moderate pain.     albuterol (VENTOLIN HFA) 108 (90 Base) MCG/ACT inhaler Inhale 1-2 puffs into the lungs every 6 (six) hours as needed for wheezing or shortness of breath. 18 g 0   aspirin EC 81 MG tablet Take 81 mg by mouth daily. Swallow whole.     atorvastatin (LIPITOR) 40 MG tablet Take 40 mg by mouth daily.     busPIRone (BUSPAR) 7.5 MG tablet Take 7.5 mg by mouth 2 (two) times daily.     calamine lotion Apply 1 Application topically 3 (three) times daily as needed for itching (to insect bites on right breast and right upper back).     clopidogrel (PLAVIX) 75 MG tablet Take 75 mg by mouth daily.     cyclobenzaprine (FLEXERIL) 5 MG tablet Take 5 mg by mouth in the morning and at bedtime.     docusate sodium (COLACE) 100 MG capsule Take 100 mg by mouth 2 (two) times daily as needed for mild constipation.     Dulaglutide 3 MG/0.5ML SOPN Inject 3 mg into the skin once a week. Saturday     estradiol (ESTRACE) 0.1 MG/GM vaginal cream Place 1 Applicatorful vaginally as needed (weekly as needed  for dryness).     fluticasone (FLONASE) 50 MCG/ACT nasal spray Place 1 spray into both nostrils daily as needed for allergies or rhinitis.     gabapentin (NEURONTIN) 400 MG capsule Take 400 mg by mouth 3 (three) times daily.     hydrOXYzine (ATARAX) 10 MG tablet Take 1 tablet (10 mg total) by mouth 2 (two) times daily. (Patient taking differently: Take 10 mg by mouth 2 (two) times daily as needed for itching.) 30 tablet 0  insulin aspart (NOVOLOG) 100 UNIT/ML FlexPen Use as directed before each meal 3 times a day: 140-199 - 2 units, 200-250 - 4 units, 251-299 - 6 units,  300-349 - 8 units,  350 or above 10 units. 15 mL 0   insulin glargine (LANTUS) 100 unit/mL SOPN Inject 25 Units into the skin at bedtime.     Insulin Pen Needle 32G X 4 MM MISC Use to inject insulin as directed 100 each 0   ipratropium-albuterol (DUONEB) 0.5-2.5 (3) MG/3ML SOLN Use 3 ml by nebulization twice a day scheduled and every 4 hours as needed for shortness of breath and wheezing 360 mL 0   Lactobacillus Rhamnosus, GG, (CULTURELLE HEALTH & WELLNESS) CAPS Take 1 capsule by mouth daily.     lidocaine 4 % Place 1 patch onto the skin daily as needed (pain).     lisinopril (ZESTRIL) 5 MG tablet Take 5 mg by mouth daily.     loratadine (CLARITIN) 10 MG tablet Take 10 mg by mouth daily as needed for allergies.     meclizine (ANTIVERT) 12.5 MG tablet Take 12.5 mg by mouth daily as needed for dizziness.     Menthol, Topical Analgesic, (BIOFREEZE) 4 % GEL Apply 1 application  topically 4 (four) times daily as needed (to right side back, neck, shoulder and hip for pain).     metFORMIN (GLUCOPHAGE) 1000 MG tablet Take 1,000 mg by mouth 2 (two) times daily with a meal.     methylPREDNISolone (MEDROL DOSEPAK) 4 MG TBPK tablet follow package directions (Patient not taking: Reported on 10/23/2022) 21 tablet 0   metroNIDAZOLE (FLAGYL) 500 MG tablet Take 1 tablet (500 mg total) by mouth every 12 (twelve) hours. 10 tablet 0   Multiple Vitamin  (MULTIVITAMIN WITH MINERALS) TABS tablet Take 1 tablet by mouth daily.     naloxone (NARCAN) nasal spray 4 mg/0.1 mL Place 1 spray into the nose once as needed (for opiod overdose).     nystatin cream (MYCOSTATIN) Apply 1 application. topically 4 (four) times daily as needed for dry skin.     ondansetron (ZOFRAN) 4 MG tablet Take 4 mg by mouth every 8 (eight) hours as needed for nausea or vomiting.     OVER THE COUNTER MEDICATION Take 1 tablet by mouth daily as needed (concentration).     pantoprazole (PROTONIX) 40 MG tablet Take 40 mg by mouth daily.     polyethylene glycol (MIRALAX / GLYCOLAX) 17 g packet Take 17 g by mouth daily as needed for moderate constipation.     rOPINIRole (REQUIP) 0.25 MG tablet Take 0.25 mg by mouth at bedtime.     sucralfate (CARAFATE) 1 GM/10ML suspension Take 1 g by mouth 4 (four) times daily as needed (stomch ulcer).     tamsulosin (FLOMAX) 0.4 MG CAPS capsule Take 1 capsule (0.4 mg total) by mouth daily. 30 capsule 0   topiramate (TOPAMAX) 25 MG tablet Take 1 tablet (25 mg total) by mouth daily. 30 tablet 0   No current facility-administered medications for this visit.    Allergies  Allergen Reactions   Bacitracin     Other reaction(s): Other Burns skin     Citalopram Diarrhea   Doxycycline Other (See Comments)    Other reaction(s): Other "burning" all over body     Morphine And Codeine Hives   Neomycin     Unknown reaction Not listed on the [facility]    Ciprofloxacin     Tongue swells   Ciprofloxacin-Dexamethasone Other (See Comments)  unknown    Duloxetine     Other reaction(s): Mental Status Changes (intolerance) "saw pictures of gun"   Latex Itching   Levaquin [Levofloxacin In D5w] Other (See Comments)    Pt does not remember reaction but states she's had issues with med   Piper     Other reaction(s): Other (See Comments) White pepper- uncontrollable sneezing    Salvia Officinalis     Sneezing Not listed on the [facility]   Soma  [Carisoprodol]     Sleepy and constipation  Not listed on the [facility]   Lasix [Furosemide] Rash     REVIEW OF SYSTEMS:  [X]  denotes positive finding, [ ]  denotes negative finding Cardiac  Comments:  Chest pain or chest pressure:    Shortness of breath upon exertion:    Short of breath when lying flat:    Irregular heart rhythm:        Vascular    Pain in calf, thigh, or hip brought on by ambulation:    Pain in feet at night that wakes you up from your sleep:     Blood clot in your veins:    Leg swelling:         Pulmonary    Oxygen at home:    Productive cough:     Wheezing:         Neurologic    Sudden weakness in arms or legs:     Sudden numbness in arms or legs:     Sudden onset of difficulty speaking or slurred speech:    Temporary loss of vision in one eye:     Problems with dizziness:         Gastrointestinal    Blood in stool:     Vomited blood:         Genitourinary    Burning when urinating:     Blood in urine:        Psychiatric    Major depression:         Hematologic    Bleeding problems:    Problems with blood clotting too easily:        Skin    Rashes or ulcers:        Constitutional    Fever or chills:      PHYSICAL EXAMINATION:  Vitals:   11/05/22 1420  BP: 108/76  Pulse: 85  Resp: 18  Temp: 98.2 F (36.8 C)  TempSrc: Temporal  SpO2: 97%  Weight: 120 lb (54.4 kg)  Height: 5\' 1"  (1.549 m)    General:  WDWN in NAD; vital signs documented above Gait: Not observed, in wheel chair HENT: WNL, normocephalic Pulmonary: normal non-labored breathing Cardiac: regular HR Abdomen: soft Vascular Exam/Pulses: 2+ femoral pulses, no palpable distal pulses. Right foot warm and well perfused. Left BKA well healed Extremities: without ischemic changes, without Gangrene , without cellulitis; without open wounds;  Musculoskeletal: no muscle wasting or atrophy  Neurologic: A&O X 3 Psychiatric:  The pt has Normal affect.   Non-Invasive Vascular  Imaging:   ***    ASSESSMENT/PLAN:: 67 y.o. female here for follow up for ***   -***   Graceann Congress, PA-C [facility] [phone]  Clinic MD:   Myra Gianotti

## 2022-11-08 ENCOUNTER — Other Ambulatory Visit: Payer: Self-pay

## 2022-11-08 DIAGNOSIS — I739 Peripheral vascular disease, unspecified: Secondary | ICD-10-CM

## 2022-11-08 DIAGNOSIS — R0989 Other specified symptoms and signs involving the circulatory and respiratory systems: Secondary | ICD-10-CM

## 2023-01-24 ENCOUNTER — Ambulatory Visit: Payer: Medicare (Managed Care) | Admitting: Podiatry

## 2023-01-30 NOTE — Progress Notes (Signed)
Sent message, via epic in basket, requesting orders in epic from surgeon.  

## 2023-01-31 ENCOUNTER — Ambulatory Visit: Payer: Medicare (Managed Care) | Admitting: Podiatry

## 2023-01-31 NOTE — Patient Instructions (Addendum)
SURGICAL WAITING ROOM VISITATION Patients having surgery or a procedure may have no more than 2 support people in the waiting area - these visitors may rotate in the visitor waiting room.   Due to an increase in RSV and influenza rates and associated hospitalizations, children ages 64 and under may not visit patients in [facility] hospitals. If the patient needs to stay at the hospital during part of their recovery, the visitor guidelines for inpatient rooms apply.  PRE-OP VISITATION  Pre-op nurse will coordinate an appropriate time for 1 support person to accompany the patient in pre-op.  This support person may not rotate.  This visitor will be contacted when the time is appropriate for the visitor to come back in the pre-op area.  Please refer to the [facility] website for the visitor guidelines for Inpatients (after your surgery is over and you are in a regular room).  You are not required to quarantine at this time prior to your surgery. However, you must do this: Hand Hygiene often Do NOT share personal items Notify your provider if you are in close contact with someone who has COVID or you develop fever 100.4 or greater, new onset of sneezing, cough, sore throat, shortness of breath or body aches.  If you test positive for Covid or have been in contact with anyone that has tested positive in the last 10 days please notify you surgeon.    Your procedure is scheduled on:  Wednesday  February 06, 2023  Report to [facility] Main Entrance: Leota Jacobsen entrance where the [employer] is available.   Report to admitting at: 09:30    AM  Call this number if you have any questions or problems the morning of surgery [phone]  Do not eat or drink anything after Midnight the night prior to your surgery/procedure.   FOLLOW ANY ADDITIONAL PRE OP INSTRUCTIONS YOU RECEIVED FROM YOUR SURGEON'S OFFICE!!!   Oral Hygiene is also important to reduce your risk of infection.         Remember - BRUSH YOUR TEETH THE MORNING OF SURGERY WITH YOUR REGULAR TOOTHPASTE  Do NOT smoke after Midnight the night before surgery.   How to Manage Your Diabetes Before and After Surgery  Why is it important to control my blood sugar before and after surgery? Improving blood sugar levels before and after surgery helps healing and can limit problems. A way of improving blood sugar control is eating a healthy diet by:  Eating less sugar and carbohydrates  Increasing activity/exercise  Talking with your doctor about reaching your blood sugar goals High blood sugars (greater than 180 mg/dL) can raise your risk of infections and slow your recovery, so you will need to focus on controlling your diabetes during the weeks before surgery. Make sure that the doctor who takes care of your diabetes knows about your planned surgery including the date and location.  How do I manage my blood sugar before surgery? Check your blood sugar at least 4 times a day, starting 2 days before surgery, to make sure that the level is not too high or low. Check your blood sugar the morning of your surgery when you wake up and every 2 hours until you get to the Short Stay unit. If your blood sugar is less than 70 mg/dL, you will need to treat for low blood sugar: Do not take insulin. Treat a low blood sugar (less than 70 mg/dL) with  cup of clear juice (cranberry or apple),  4 glucose tablets, OR glucose gel. Recheck blood sugar in 15 minutes after treatment (to make sure it is greater than 70 mg/dL). If your blood sugar is not greater than 70 mg/dL on recheck, call [phone] for further instructions. Report your blood sugar to the short stay nurse when you get to Short Stay.  If you are admitted to the hospital after surgery: Your blood sugar will be checked by the staff and you will probably be given insulin after surgery (instead of oral diabetes medicines) to make sure you have good blood sugar levels. The  goal for blood sugar control after surgery is 80-180 mg/dL.   WHAT DO I DO ABOUT MY DIABETES MEDICATION?  Trulicity on Saturday    Hold x 7 days ,  last taken on 01-27-2023,   Metformin 1000 mg bid.  Do not take Metformin on the DAY OF SURGERY  Insulin aspart (Novolog)  Day before surgery: take as usual.    DAY of SURGERY ;If your CBG is greater than 220 mg/dL, you may take  of your sliding scale (correction) dose of insulin.   Insulin Toujeo-  25 units q hs  Day before surgery : 50% of usual dose.  DOS: none    IF you have any questions, call the nurse at [phone]  STOP TAKING all Vitamins, Herbs and supplements 1 week before your surgery.   ASPIRIN-  stop taking Aspirin 5 days prior to your surgery.  Last dose will be taken on 01-31-23  PLAVIX- stop taking Plavix 5 days prior to your surgery.  Last dose will be taken on 01-31-23  Take ONLY these medicines the morning of surgery with A SIP OF WATER: Pantoprazole, tamsulosin, Fluoxetine, Topiramate, Gabapentin, Buspirone. You may use your Flonase nasal spray if needed.  You may use your Inhalers and Nebulizer if needed.                     You may not have any metal on your body including hair pins, jewelry, and body piercing  Do not wear make-up, lotions, powders, perfumes or deodorant  Do not wear nail polish including gel and S&S, artificial / acrylic nails, or any other type of covering on natural nails including finger and toenails. If you have artificial nails, gel coating, etc., that needs to be removed by a nail salon, Please have this removed prior to surgery. Not doing so may mean that your surgery could be cancelled or delayed if the Surgeon or anesthesia staff feels like they are unable to monitor you safely.   Do not shave 48 hours prior to surgery to avoid nicks in your skin which may contribute to postoperative infections.   Contacts, Hearing Aids, dentures or bridgework may not be worn into surgery. DENTURES WILL BE  REMOVED PRIOR TO SURGERY PLEASE DO NOT APPLY "Poly grip" OR ADHESIVES!!!  Patients discharged on the day of surgery will not be allowed to drive home.  Someone NEEDS to stay with you for the first 24 hours after anesthesia.  Do not bring your home medications to the hospital. The Pharmacy will dispense medications listed on your medication list to you during your admission in the Hospital.  Special Instructions: Bring a copy of your healthcare power of attorney and living will documents the day of surgery, if you wish to have them scanned into your [facility] Records- EPIC  Please read over the following fact sheets you were given: IF YOU HAVE QUESTIONS ABOUT YOUR PRE-OP  Angelita Ingles [phone]   [facility] - Preparing for Surgery Before surgery, you can play an important role.  Because skin is not sterile, your skin needs to be as free of germs as possible.  You can reduce the number of germs on your skin by washing with CHG (chlorahexidine gluconate) soap before surgery.  CHG is an antiseptic cleaner which kills germs and bonds with the skin to continue killing germs even after washing. Please DO NOT use if you have an allergy to CHG or antibacterial soaps.  If your skin becomes reddened/irritated stop using the CHG and inform your nurse when you arrive at Short Stay. Do not shave (including legs and underarms) for at least 48 hours prior to the first CHG shower.  You may shave your face/neck.  Please follow these instructions carefully:  1.  Shower with CHG Soap the night before surgery and the  morning of surgery.  2.  If you choose to wash your hair, wash your hair first as usual with your normal  shampoo.  3.  After you shampoo, rinse your hair and body thoroughly to remove the shampoo.                             4.  Use CHG as you would any other liquid soap.  You can apply chg directly to the skin and wash.  Gently with a scrungie or clean washcloth.  5.  Apply  the CHG Soap to your body ONLY FROM THE NECK DOWN.   Do not use on face/ open                           Wound or open sores. Avoid contact with eyes, ears mouth and genitals (private parts).                       Wash face,  Genitals (private parts) with your normal soap.             6.  Wash thoroughly, paying special attention to the area where your  surgery  will be performed.  7.  Thoroughly rinse your body with warm water from the neck down.  8.  DO NOT shower/wash with your normal soap after using and rinsing off the CHG Soap.            9.  Pat yourself dry with a clean towel.            10.  Wear clean pajamas.            11.  Place clean sheets on your bed the night of your first shower and do not  sleep with pets.  ON THE DAY OF SURGERY : Do not apply any lotions/deodorants the morning of surgery.  Please wear clean clothes to the hospital/surgery center.     FAILURE TO FOLLOW THESE INSTRUCTIONS MAY RESULT IN THE CANCELLATION OF YOUR SURGERY  PATIENT SIGNATURE_________________________________  NURSE SIGNATURE__________________________________  ________________________________________________________________________

## 2023-01-31 NOTE — Progress Notes (Addendum)
COVID Vaccine received:  []  No [x]  Yes Date of any COVID positive Test in last 90 days:  none  PCP - Marny Lowenstein, MD at [facility]  Cardiologist - none  Chest x-ray - 10-04-2022  1v &  09-30-2022  2v  Epic EKG - 09-30-2022  Epic  Stress Test -  ECHO - 08-25-2021  Epic Cardiac Cath -   PCR screen: []  Ordered & Completed []   No Order but Needs PROFEND     [x]   N/A for this surgery  Surgery Plan:  [x]  Ambulatory   []  Outpatient in bed  []  Admit Anesthesia:    [x]  General  []  Spinal  []   Choice []   MAC Bowel Prep - [x]  No  []   Yes ______  Pacemaker / ICD device [x]  No []  Yes   Spinal Cord Stimulator:[x]  No []  Yes       History of Sleep Apnea? []  No [x]  Yes   CPAP used?- [x]  No []  Yes     Not wearing   Does the patient monitor blood sugar?   []  N/A   []  No []  Yes  Patient has: []  NO Hx DM   []  Pre-DM   []  DM1  [x]   DM2 Last A1c was: 7.1 on   12-22-22 done at [facility]   Does patient have a Freestyle [place] or Dexacom? [x]  No []  Yes   Fasting Blood Sugar Ranges-  Checks Blood Sugar _2_ times a day  GLP1 agonist / usual dose - Trulicity on Saturday GLP1 instructions: Hold x 7 days ,  last taken on 01-27-2023  Diabetic medications/ instructions:  Metformin 1000 mg bid.  Hold DOS Insulin aspart (Novolog)  SS tid    DOS; >220 take 1/2 correction dose.  Insulin Toujeo-  25 units q hs  Day before: 50% of usual dose.  DOS: none  Blood Thinner / Instructions:  Plavix  hold x 5 days per note from Dr. Renato Gails Aspirin Instructions:  ASA 81mg   Hold x 5 days per note from Dr. Renato Gails.   ERAS Protocol Ordered: [x]  No  []  Yes Patient is to be NPO after:  Midnight prior  Dental hx: [x]  Dentures: full top plate  []  N/A      []  Bridge or Partial:                   []  Loose or Damaged teeth:   Comments: Faxed surgery / medication  instructions to [place] [facility]. Patient gets meds by pill packs from the [facility]. Per Glee Arvin at [facility], they will send someone to Ms. Wecker's home to make  sure she takes only her specified medications up to and including her DOS. I spoke to [place] at Dr. Alphonsa Overall office and she is aware of all this.   Activity level: Patient is unable to climb a flight of stairs without difficulty, she uses wheelchair ; [x]  No CP  []  No SOB.  Patient can perform ADLs without assistance, has an aide to clean her house, etc  Anesthesia review: HTN, DM2. PAD- left BKA, CVA 2023, CAD- TR, MR,  GERD,  some vision loss - Left eye, anemia  Patient denies shortness of breath, fever, cough and chest pain at PAT appointment.  Patient verbalized understanding and agreement to the Pre-Surgical Instructions that were given to them at this PAT appointment. Patient was also educated of the need to review these PAT instructions again prior to her surgery.I reviewed the  appropriate phone numbers to call if they have any and questions or concerns.

## 2023-02-01 ENCOUNTER — Encounter (HOSPITAL_COMMUNITY)
Admission: RE | Admit: 2023-02-01 | Discharge: 2023-02-01 | Disposition: A | Discharge status: Home / Self Care | Payer: Medicare (Managed Care) | Source: Ambulatory Visit | Attending: Urology | Admitting: Urology

## 2023-02-01 ENCOUNTER — Other Ambulatory Visit: Payer: Self-pay

## 2023-02-01 ENCOUNTER — Other Ambulatory Visit: Payer: Self-pay | Admitting: Urology

## 2023-02-01 ENCOUNTER — Encounter (HOSPITAL_COMMUNITY): Payer: Self-pay

## 2023-02-01 VITALS — BP 150/60 | HR 82 | Temp 99.1°F | Resp 18 | Ht 61.0 in | Wt 153.0 lb

## 2023-02-01 DIAGNOSIS — R7989 Other specified abnormal findings of blood chemistry: Secondary | ICD-10-CM | POA: Insufficient documentation

## 2023-02-01 DIAGNOSIS — Z01812 Encounter for preprocedural laboratory examination: Secondary | ICD-10-CM | POA: Insufficient documentation

## 2023-02-01 DIAGNOSIS — I10 Essential (primary) hypertension: Secondary | ICD-10-CM | POA: Insufficient documentation

## 2023-02-01 DIAGNOSIS — Z794 Long term (current) use of insulin: Secondary | ICD-10-CM | POA: Diagnosis not present

## 2023-02-01 DIAGNOSIS — E1152 Type 2 diabetes mellitus with diabetic peripheral angiopathy with gangrene: Secondary | ICD-10-CM | POA: Diagnosis not present

## 2023-02-01 HISTORY — DX: Gastro-esophageal reflux disease without esophagitis: K21.9

## 2023-02-01 HISTORY — DX: Pneumonia, unspecified organism: J18.9

## 2023-02-01 HISTORY — DX: Anemia, unspecified: D64.9

## 2023-02-01 HISTORY — DX: Chronic obstructive pulmonary disease, unspecified: J44.9

## 2023-02-01 HISTORY — DX: Depression, unspecified: F32.A

## 2023-02-01 HISTORY — DX: Chronic kidney disease, unspecified: N18.9

## 2023-02-01 HISTORY — DX: Anxiety disorder, unspecified: F41.9

## 2023-02-01 LAB — COMPREHENSIVE METABOLIC PANEL
ALT: 162 U/L — ABNORMAL HIGH (ref 0–44)
AST: 65 U/L — ABNORMAL HIGH (ref 15–41)
Albumin: 3.8 g/dL (ref 3.5–5.0)
Alkaline Phosphatase: 74 U/L (ref 38–126)
Anion gap: 8 (ref 5–15)
BUN: 18 mg/dL (ref 8–23)
CO2: 25 mmol/L (ref 22–32)
Calcium: 9.3 mg/dL (ref 8.9–10.3)
Chloride: 102 mmol/L (ref 98–111)
Creatinine, Ser: 0.84 mg/dL (ref 0.44–1.00)
GFR, Estimated: >60 mL/min (ref >60)
Glucose, Bld: 135 mg/dL — ABNORMAL HIGH (ref 70–99)
Potassium: 5 mmol/L (ref 3.5–5.1)
Sodium: 135 mmol/L (ref 135–145)
Total Bilirubin: 0.3 mg/dL (ref <1.2)
Total Protein: 7.5 g/dL (ref 6.5–8.1)

## 2023-02-01 LAB — CBC
HCT: 35.7 % — ABNORMAL LOW (ref 36.0–46.0)
Hemoglobin: 11.1 g/dL — ABNORMAL LOW (ref 12.0–15.0)
MCH: 29 pg (ref 26.0–34.0)
MCHC: 31.1 g/dL (ref 30.0–36.0)
MCV: 93.2 fL (ref 80.0–100.0)
Platelets: 488 10*3/uL — ABNORMAL HIGH (ref 150–400)
RBC: 3.83 MIL/uL — ABNORMAL LOW (ref 3.87–5.11)
RDW: 13.4 % (ref 11.5–15.5)
WBC: 7.9 10*3/uL (ref 4.0–10.5)
nRBC: 0 % (ref 0.0–0.2)

## 2023-02-01 LAB — HEMOGLOBIN A1C
Hgb A1c MFr Bld: 7 % — ABNORMAL HIGH (ref 4.8–5.6)
Mean Plasma Glucose: 154.2 mg/dL

## 2023-02-01 LAB — GLUCOSE, CAPILLARY: Glucose-Capillary: 132 mg/dL — ABNORMAL HIGH (ref 70–99)

## 2023-02-06 ENCOUNTER — Ambulatory Visit (HOSPITAL_COMMUNITY): Payer: Medicare (Managed Care) | Admitting: Physician Assistant

## 2023-02-06 ENCOUNTER — Ambulatory Visit (HOSPITAL_COMMUNITY)
Admission: RE | Admit: 2023-02-06 | Discharge: 2023-02-06 | Disposition: A | Discharge status: Home / Self Care | Payer: Medicare (Managed Care) | Attending: Urology | Admitting: Urology

## 2023-02-06 ENCOUNTER — Ambulatory Visit (HOSPITAL_BASED_OUTPATIENT_CLINIC_OR_DEPARTMENT_OTHER): Payer: Medicare (Managed Care) | Admitting: Certified Registered"

## 2023-02-06 ENCOUNTER — Ambulatory Visit (HOSPITAL_COMMUNITY): Payer: Medicare (Managed Care)

## 2023-02-06 ENCOUNTER — Encounter (HOSPITAL_COMMUNITY): Payer: Self-pay | Admitting: Urology

## 2023-02-06 ENCOUNTER — Encounter (HOSPITAL_COMMUNITY)
Admission: RE | Disposition: A | Discharge status: Home / Self Care | Payer: Self-pay | Source: Home / Self Care | Attending: Urology

## 2023-02-06 DIAGNOSIS — Z466 Encounter for fitting and adjustment of urinary device: Secondary | ICD-10-CM

## 2023-02-06 DIAGNOSIS — Z7985 Long-term (current) use of injectable non-insulin antidiabetic drugs: Secondary | ICD-10-CM | POA: Insufficient documentation

## 2023-02-06 DIAGNOSIS — Z8673 Personal history of transient ischemic attack (TIA), and cerebral infarction without residual deficits: Secondary | ICD-10-CM | POA: Insufficient documentation

## 2023-02-06 DIAGNOSIS — N3020 Other chronic cystitis without hematuria: Secondary | ICD-10-CM | POA: Diagnosis not present

## 2023-02-06 DIAGNOSIS — I739 Peripheral vascular disease, unspecified: Secondary | ICD-10-CM | POA: Insufficient documentation

## 2023-02-06 DIAGNOSIS — F32A Depression, unspecified: Secondary | ICD-10-CM | POA: Insufficient documentation

## 2023-02-06 DIAGNOSIS — J4489 Other specified chronic obstructive pulmonary disease: Secondary | ICD-10-CM | POA: Insufficient documentation

## 2023-02-06 DIAGNOSIS — R519 Headache, unspecified: Secondary | ICD-10-CM | POA: Diagnosis not present

## 2023-02-06 DIAGNOSIS — G4733 Obstructive sleep apnea (adult) (pediatric): Secondary | ICD-10-CM | POA: Insufficient documentation

## 2023-02-06 DIAGNOSIS — Z79899 Other long term (current) drug therapy: Secondary | ICD-10-CM | POA: Diagnosis not present

## 2023-02-06 DIAGNOSIS — Z7984 Long term (current) use of oral hypoglycemic drugs: Secondary | ICD-10-CM | POA: Insufficient documentation

## 2023-02-06 DIAGNOSIS — N135 Crossing vessel and stricture of ureter without hydronephrosis: Secondary | ICD-10-CM | POA: Insufficient documentation

## 2023-02-06 DIAGNOSIS — E785 Hyperlipidemia, unspecified: Secondary | ICD-10-CM | POA: Diagnosis not present

## 2023-02-06 DIAGNOSIS — F419 Anxiety disorder, unspecified: Secondary | ICD-10-CM | POA: Diagnosis not present

## 2023-02-06 DIAGNOSIS — E119 Type 2 diabetes mellitus without complications: Secondary | ICD-10-CM | POA: Insufficient documentation

## 2023-02-06 DIAGNOSIS — Z794 Long term (current) use of insulin: Secondary | ICD-10-CM | POA: Diagnosis not present

## 2023-02-06 DIAGNOSIS — I10 Essential (primary) hypertension: Secondary | ICD-10-CM | POA: Insufficient documentation

## 2023-02-06 DIAGNOSIS — Z89612 Acquired absence of left leg above knee: Secondary | ICD-10-CM | POA: Insufficient documentation

## 2023-02-06 DIAGNOSIS — K219 Gastro-esophageal reflux disease without esophagitis: Secondary | ICD-10-CM | POA: Diagnosis not present

## 2023-02-06 HISTORY — PX: CYSTOSCOPY WITH RETROGRADE PYELOGRAM, URETEROSCOPY AND STENT PLACEMENT: SHX5789

## 2023-02-06 LAB — GLUCOSE, CAPILLARY
Glucose-Capillary: 124 mg/dL — ABNORMAL HIGH (ref 70–99)
Glucose-Capillary: 101 mg/dL — ABNORMAL HIGH (ref 70–99)
Glucose-Capillary: 112 mg/dL — ABNORMAL HIGH (ref 70–99)

## 2023-02-06 SURGERY — CYSTOURETEROSCOPY, WITH RETROGRADE PYELOGRAM AND STENT INSERTION
Anesthesia: General | Site: Bladder | Laterality: Right

## 2023-02-06 MED ORDER — FENTANYL CITRATE PF 50 MCG/ML IJ SOSY
PREFILLED_SYRINGE | INTRAMUSCULAR | Status: AC
  Filled 2023-02-06: qty 1

## 2023-02-06 MED ORDER — DEXAMETHASONE SODIUM PHOSPHATE 10 MG/ML IJ SOLN
INTRAMUSCULAR | Status: AC
Start: 2023-02-06 — End: ?
  Filled 2023-02-06: qty 1

## 2023-02-06 MED ORDER — IOHEXOL 300 MG/ML  SOLN
INTRAMUSCULAR | Status: DC | PRN
  Administered 2023-02-06: 18 mL

## 2023-02-06 MED ORDER — INSULIN ASPART 100 UNIT/ML IJ SOLN: 0.0000 [IU] | INTRAMUSCULAR | Status: DC | PRN

## 2023-02-06 MED ORDER — ONDANSETRON HCL 4 MG/2ML IJ SOLN
INTRAMUSCULAR | Status: DC | PRN
  Administered 2023-02-06: 4 mg via INTRAVENOUS

## 2023-02-06 MED ORDER — FENTANYL CITRATE (PF) 100 MCG/2ML IJ SOLN
INTRAMUSCULAR | Status: AC
  Filled 2023-02-06: qty 2

## 2023-02-06 MED ORDER — LACTATED RINGERS IV SOLN: INTRAVENOUS | Status: DC

## 2023-02-06 MED ORDER — DROPERIDOL 2.5 MG/ML IJ SOLN: 0.6250 mg | Freq: Once | INTRAMUSCULAR | Status: DC | PRN

## 2023-02-06 MED ORDER — ONDANSETRON HCL 4 MG/2ML IJ SOLN
INTRAMUSCULAR | Status: AC
  Filled 2023-02-06: qty 2

## 2023-02-06 MED ORDER — ORAL CARE MOUTH RINSE: 15.0000 mL | Freq: Once | OROMUCOSAL | Status: AC

## 2023-02-06 MED ORDER — PHENYLEPHRINE 80 MCG/ML (10ML) SYRINGE FOR IV PUSH (FOR BLOOD PRESSURE SUPPORT)
PREFILLED_SYRINGE | INTRAVENOUS | Status: DC | PRN
  Administered 2023-02-06 (×3): 80 ug via INTRAVENOUS

## 2023-02-06 MED ORDER — CEFAZOLIN SODIUM-DEXTROSE 2-4 GM/100ML-% IV SOLN
2.0000 g | INTRAVENOUS | Status: AC
Start: 2023-02-06 — End: 2023-02-06
  Administered 2023-02-06: 2 g via INTRAVENOUS
  Filled 2023-02-06: qty 100

## 2023-02-06 MED ORDER — DEXAMETHASONE SODIUM PHOSPHATE 10 MG/ML IJ SOLN
INTRAMUSCULAR | Status: DC | PRN
  Administered 2023-02-06: 4 mg via INTRAVENOUS

## 2023-02-06 MED ORDER — OXYCODONE HCL 5 MG/5ML PO SOLN
5.0000 mg | Freq: Once | ORAL | Status: AC | PRN
Start: 2023-02-06 — End: 2023-02-06

## 2023-02-06 MED ORDER — PROPOFOL 10 MG/ML IV BOLUS
INTRAVENOUS | Status: DC | PRN
  Administered 2023-02-06: 150 mg via INTRAVENOUS

## 2023-02-06 MED ORDER — LIDOCAINE 2% (20 MG/ML) 5 ML SYRINGE
INTRAMUSCULAR | Status: DC | PRN
  Administered 2023-02-06: 60 mg via INTRAVENOUS

## 2023-02-06 MED ORDER — OXYCODONE HCL 5 MG PO TABS
ORAL_TABLET | ORAL | Status: AC
  Filled 2023-02-06: qty 1

## 2023-02-06 MED ORDER — PROPOFOL 10 MG/ML IV BOLUS
INTRAVENOUS | Status: AC
  Filled 2023-02-06: qty 20

## 2023-02-06 MED ORDER — PHENAZOPYRIDINE HCL 200 MG PO TABS
200.0000 mg | ORAL_TABLET | Freq: Once | ORAL | Status: AC
  Administered 2023-02-06: 200 mg via ORAL

## 2023-02-06 MED ORDER — ACETAMINOPHEN 10 MG/ML IV SOLN
1000.0000 mg | Freq: Once | INTRAVENOUS | Status: DC | PRN
Start: 2023-02-06 — End: 2023-02-06

## 2023-02-06 MED ORDER — FENTANYL CITRATE (PF) 100 MCG/2ML IJ SOLN
INTRAMUSCULAR | Status: DC | PRN
  Administered 2023-02-06 (×2): 50 ug via INTRAVENOUS

## 2023-02-06 MED ORDER — FENTANYL CITRATE PF 50 MCG/ML IJ SOSY
25.0000 ug | PREFILLED_SYRINGE | INTRAMUSCULAR | Status: DC | PRN
  Administered 2023-02-06: 50 ug via INTRAVENOUS

## 2023-02-06 MED ORDER — PHENAZOPYRIDINE HCL 200 MG PO TABS
ORAL_TABLET | ORAL | Status: AC
  Filled 2023-02-06: qty 1

## 2023-02-06 MED ORDER — SODIUM CHLORIDE 0.9 % IR SOLN
Status: DC | PRN
  Administered 2023-02-06: 3000 mL via INTRAVESICAL

## 2023-02-06 MED ORDER — OXYCODONE HCL 5 MG PO TABS
5.0000 mg | ORAL_TABLET | Freq: Once | ORAL | Status: AC | PRN
  Administered 2023-02-06: 5 mg via ORAL

## 2023-02-06 MED ORDER — CHLORHEXIDINE GLUCONATE 0.12 % MT SOLN
15.0000 mL | Freq: Once | OROMUCOSAL | Status: AC
  Administered 2023-02-06: 15 mL via OROMUCOSAL

## 2023-02-06 SURGICAL SUPPLY — 21 items
BAG URO CATCHER STRL LF (MISCELLANEOUS) ×1 IMPLANT
BASKET ZERO TIP NITINOL 2.4FR (BASKET) IMPLANT
CATH URETL OPEN 5X70 (CATHETERS) ×1 IMPLANT
CLOTH BEACON ORANGE TIMEOUT ST (SAFETY) ×1 IMPLANT
EXTRACTOR STONE NITINOL NGAGE (UROLOGICAL SUPPLIES) IMPLANT
FIBER LASER MOSES 200 DFL (Laser) IMPLANT
FIBER LASER MOSES 365 DFL (Laser) IMPLANT
GLOVE SURG LX STRL 7.5 STRW (GLOVE) ×1 IMPLANT
GOWN SRG XL LVL 4 BRTHBL STRL (GOWNS) ×1 IMPLANT
GOWN STRL NON-REIN XL LVL4 (GOWNS) ×1
GUIDEWIRE STR DUAL SENSOR (WIRE) IMPLANT
GUIDEWIRE ZIPWRE .038 STRAIGHT (WIRE) ×1 IMPLANT
KIT TURNOVER KIT A (KITS) IMPLANT
MANIFOLD NEPTUNE II (INSTRUMENTS) ×1 IMPLANT
PACK CYSTO (CUSTOM PROCEDURE TRAY) ×1 IMPLANT
PAD PREP 24X48 CUFFED NSTRL (MISCELLANEOUS) ×1 IMPLANT
SHEATH NAVIGATOR HD 11/13X36 (SHEATH) IMPLANT
STENT URET 6FRX24 CONTOUR (STENTS) IMPLANT
STENT URET 6FRX26 CONTOUR (STENTS) IMPLANT
TUBING CONNECTING 10 (TUBING) ×1 IMPLANT
TUBING UROLOGY SET (TUBING) ×1 IMPLANT

## 2023-02-06 NOTE — Anesthesia Postprocedure Evaluation (Signed)
Anesthesia Post Note  Patient: Anna [name]  Procedure(s) Performed: CYSTOSCOPY WITH RIGHT  RETROGRADE PYELOGRAM AND RIGHT URETERAL STENT EXCHANGE (Right: Bladder)     Patient location during evaluation: PACU Anesthesia Type: General Level of consciousness: awake and alert Pain management: pain level controlled Vital Signs Assessment: post-procedure vital signs reviewed and stable Respiratory status: spontaneous breathing, nonlabored ventilation, respiratory function stable and patient connected to nasal cannula oxygen Cardiovascular status: blood pressure returned to baseline and stable Postop Assessment: no apparent nausea or vomiting Anesthetic complications: no   No notable events documented.  Last Vitals:  Vitals:   02/06/23 1300 02/06/23 1315  BP: 126/63 (!) 142/67  Pulse: 76 75  Resp: 10 12  Temp:    SpO2: 98% 93%    Last Pain:  Vitals:   02/06/23 1320  TempSrc:   PainSc: Asleep                 [place]

## 2023-02-06 NOTE — Anesthesia Procedure Notes (Signed)
Procedure Name: LMA Insertion Date/Time: 02/06/2023 12:00 PM  Performed by: Sindy Guadeloupe, CRNAPre-anesthesia Checklist: Patient identified, Emergency Drugs available, Suction available, Patient being monitored and Timeout performed Patient Re-evaluated:Patient Re-evaluated prior to induction Oxygen Delivery Method: Circle system utilized Preoxygenation: Pre-oxygenation with 100% oxygen Induction Type: IV induction Ventilation: Mask ventilation without difficulty LMA: LMA inserted and LMA with gastric port inserted LMA Size: 4.0 Number of attempts: 1 Tube secured with: Tape Dental Injury: Teeth and Oropharynx as per pre-operative assessment

## 2023-02-06 NOTE — Op Note (Signed)
Operative Note  Preoperative diagnosis:  1.  Right UPJ obstruction  Postoperative diagnosis: 1.  Right UPJ obstruction  Procedure(s): 1.  Cystoscopy with right ureteral stent exchange 2.  Right retrograde pyelogram with intraoperative interpretation of fluoroscopic imaging  Surgeon: Rhoderick Moody, MD  Assistants:  None  Anesthesia:  General  Complications:  None  EBL: Less than 5 mL  Specimens: 1.  Previously placed right ureteral stent was removed intact, inspected and discarded  Drains/Catheters: 1.  6 French, 24 cm JJ stent without tether  Intraoperative findings:   Right retrograde pyelogram revealed tortuosity of the right UPJ with mild stenosis along with uniform dilation of the right renal pelvis.  No other filling defects were seen involving the distal aspects of the right ureter or within the renal pelvis.  Indication:  Anna [name] is a 67 y.o. 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  She is here today for stent exchange.  She has been consented for the above procedures, voices understanding and wishes to proceed.  Description of procedure:  After informed consent was obtained, the patient was brought to the operating room and general LMA anesthesia was administered. The patient was then placed in the dorsolithotomy position and prepped and draped in the usual sterile fashion. A timeout was performed. A 23 French rigid cystoscope was then inserted into the urethral meatus and advanced into the bladder under direct vision. A complete bladder survey revealed no intravesical pathology.  Her previously placed right ureteral stent was grasped its distal curl and removed intact.  A 5 French ureteral catheter was then inserted into the right ureteral orifice and a retrograde pyelogram was obtained, with the findings listed above.  A Glidewire was then used to intubate the lumen of the ureteral catheter and was  advanced up to the right renal pelvis, under fluoroscopic guidance.  The catheter was then removed, leaving the wire in place.  A new 6 [place], 24 cm JJ stent was then placed over the Glidewire and into good position within the right collecting system, confirming placement via fluoroscopy.  The patient's bladder was drained.  She tolerated the procedure well was transferred to the postanesthesia in stable condition.  Plan: Follow-up in 3 months to plan her next stent exchange

## 2023-02-06 NOTE — H&P (Signed)
Office Visit Report     01/24/2023   --------------------------------------------------------------------------------   Anna [name]  MRN: [id number]  DOB: 1955/07/30, 67 year old Female  SSN:    PRIMARY CARE:  Jethro Bastos, MD  PRIMARY CARE FAX:  [phone]  REFERRING:  Bartholomew Crews, NP  PROVIDER:  Modena Slater, Radene Knee, M.D.  TREATING:  Rhoderick Moody, M.D.  LOCATION:  [facility] [phone]     --------------------------------------------------------------------------------   CC/HPI: CC: UPJ obstruction   HPI: Anna [name] is a 67 year old female past medical history includes type 2 diabetes, hyperlipidemia, hypertension, left AKA, COPD and right UPJ obstruction. She was hospitalized from 09/30/2022 until 10/04/2022. She presented with hypotension and weakness and was found to have right UPJ obstruction thought to be extrinsic from a vessel. She had E. coli bacteremia as well. She underwent right ureteral stent placement and right retrograde pyelogram with Dr. Alvester Morin. She was given 6 doses of Rocephin and discharged on Keflex and Flagyl. She also had aspiration pneumonia, and did require pressors for short time. She presents today for discussion of right ureteral stent either removal or further evaluation of UPJ obstruction.   She has tolerated the stent well. She is mostly incontinent. denies fevers, chills and nausea. SHe is with [facility].   01/24/23: The patient is here today for a routine follow-up. Above history noted. Prominent right lower pole vessels are causing a right UPJ obstruction, s/p right JJ stent placement on 09/30/22 with Dr. Deirdre Pippins has stent in place. She is currently on a course of augmentin due a suspected UTI. One week ago she started experiencing right lower quadrant pain.     ALLERGIES: Bacitracin OINT - burns skin Ciprofloxacin - Swelling Citalopram - Diarrhea Codeine - Hives dexamethasone Doxycycline - burning all over  body Duloxetine Lasix - Skin Rash, Itching Levaquin Morphine Hcl - Hives Morphine Sulfate - Hives Neomycin Neosporin Pepper - sneezing Salvia Officinalis - sneezing Soma - sleepy and constipation    MEDICATIONS: Aspirin  Augmentin 500 mg-125 mg tablet  Lisinopril 5 mg tablet  Metformin Hcl 1,000 mg tablet  Acetaminophen 500 mg tablet  Albuterol Sulfate Hfa  Atorvastatin Calcium 40 mg tablet  Biofreeze 4 % gel  Buspirone Hcl 7.5 mg tablet  Calamine lotion  Clopidogrel 75 mg tablet  [facility]  Cyclobenzaprine Hcl 5 mg tablet  Dulaglutide 3Mg /0.5Ml SOPN  Estradiol 0.01 % cream with applicator  Fluticasone Propionate 50 mcg/actuation spray, suspension  Gabapentin 400 mg capsule  Hydroxyzine Hcl 10 mg tablet  Lactobacillus  Lantus 100 unit/ml vial  Loratadine 10 mg tablet  Meclizine Hcl 12.5 mg tablet  Multivitamin With Minerals  Nystatin 100,000 unit/gram cream  Ondansetron Hcl 4 mg tablet  Pantoprazole Sodium 40 mg tablet, delayed release  Polyethylene Glycol 3350 17 gram powder in packet  Probiotic  Ropinirole Hcl 0.25 mg tablet  Sucralfate 1 gram tablet  Topiramate 25 mg tablet     GU PSH: Catheterization For Collection Of Specimen, Single Patient, All Places Of Service - 12/06/2022 Debride Skin/tissue, Left - 08/24/2021       PSH Notes: abdominal aortogram with lower extremity left 08-22-2021, metatarsal head excision left 08-24-2021, vascular balloon angioplasty   NON-GU PSH: Amputate Lower Leg, Left - 10/11/2021 Cholecystectomy (laparoscopic) Correct Bunion - 08/24/2021 C-Section Eye Surgery (Unspecified), Left TRLML BALO ANGIOP OPEN/PERQ IMG S&I 1ST ART, Left - 08/22/2021     GU PMH: UPJ obstruction (acquired) - 12/06/2022      PMH Notes: Cardiovascular  and Mediastinum  Essential hypertension  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)  Ischemic stroke (HCC)  PAD (peripheral artery disease) (HCC)  Type 2 diabetes  mellitus with diabetic peripheral angiopathy and gangrene, with long-term current use of insulin (HCC)   Respiratory  COPD mixed type (HCC)  OSA (obstructive sleep apnea)   Digestive  Dysphagia  GERD (gastroesophageal reflux disease)   Endocrine  Diabetic neuropathy (HCC)  Type 2 diabetes mellitus (HCC)   Nervous and Auditory  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   Musculoskeletal and Integument  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   Genitourinary  Acute pyelonephritis  Pyelonephritis   Hematopoietic and Hemostatic  Thrombocytosis   Other   Dyslipidemia   Mood disorder (HCC)   Anxiety   Hyperlipidemia  Cellulitis  Hyperkalemia  Gangrene of toe of left foot (HCC)  History of adenomatous polyp of colon  Left sided numbness  Screening for colon cancer  Spinal stenosis of lumbar region with neurogenic claudication  Status post amputation of lesser toe of left foot (HCC)  Vision loss of left eye  Sepsis secondary to UTI (HCC)     NON-GU PMH: Anxiety Arthritis Asthma COPD Coronary Artery Disease Depression Diabetes Type 2 DVT, History GERD Hypercholesterolemia Hypertension Peripheral vascular disease Sleep Apnea Stroke/TIA Type 2 diabetes mellitus with diabetic neuropathy, unspecified    FAMILY HISTORY: 1 Daughter - Daughter 3 Son's - Son Deceased - Father, Mother Kidney Stones - Son Lung Cancer - Mother   SOCIAL HISTORY: Marital Status: Widowed Preferred Language: English; Ethnicity: Not Hispanic Or Latino; Race: White Current Smoking Status: Patient has never smoked.   Tobacco Use Assessment Completed: Used Tobacco in last 30 days? Does not use smokeless tobacco. Has never drank.  Patient's occupation is/was retired.    REVIEW OF SYSTEMS:    GU Review Female:   Patient denies frequent urination, hard to postpone urination, burning /pain with urination, get up at night to urinate, leakage of urine,  stream starts and stops, trouble starting your stream, have to strain to urinate, and being pregnant.  Gastrointestinal (Upper):   Patient denies nausea, vomiting, and indigestion/ heartburn.  Gastrointestinal (Lower):   Patient denies diarrhea and constipation.  Constitutional:   Patient denies fever, night sweats, weight loss, and fatigue.  Skin:   Patient denies skin rash/ lesion and itching.  Eyes:   Patient denies blurred vision and double vision.  Ears/ Nose/ Throat:   Patient denies sore throat and sinus problems.  Hematologic/Lymphatic:   Patient denies swollen glands and easy bruising.  Cardiovascular:   Patient denies leg swelling and chest pains.  Respiratory:   Patient denies cough and shortness of breath.  Endocrine:   Patient denies excessive thirst.  Musculoskeletal:   Patient denies back pain and joint pain.  Neurological:   Patient denies headaches and dizziness.  Psychologic:   Patient denies depression and anxiety.   VITAL SIGNS: None   MULTI-SYSTEM PHYSICAL EXAMINATION:    Constitutional: Well-nourished. No physical deformities. Normally developed. Good grooming.  Neurologic / Psychiatric: Oriented to time, oriented to place, oriented to person. No depression, no anxiety, no agitation.  Musculoskeletal: Wheelchair bound. Left BKA     Complexity of Data:  Records Review:   Previous Patient Records  X-Ray Review: C.T. Chest/ Abd/Pelvis: Reviewed Films. Reviewed Report. Discussed With Patient.     PROCEDURES: None   ASSESSMENT:      ICD-10 Details  1 GU:   UPJ  obstruction (acquired) - Q62.39 Right, Chronic, Stable  2   Chronic cystitis (w/o hematuria) - N30.20 Chronic, Stable   PLAN:           Schedule Return Visit/Planned Activity: Next Available Appointment - Schedule Surgery          Document Letter(s):  Created for Patient: Clinical Summary         Notes:   -The risks, benefits and alternatives of RIGHT robot-assisted pyeloplasty was discussed with  the patient. Risks include, but are not limited to, bleeding, infection, recurrence of the UPJ obstruction, ureteral stricture disease, urine leak, chronic pain, nephrectomy, multiple surgeries to correct her UPJ obstruction and the need for an indwelling stent, MI, CVA, PE, DVT and in the inherent risks of general anesthesia. The patient voices understanding.   -At this point she would like to proceed with right stent exchange and think more about her options.   -The risks, benefits and alternatives of cystoscopy with RIGHT JJ stent exchangewas discussed with the patient. Risks include, but are not limited to: bleeding, urinary tract infection, ureteral injury, ureteral stricture disease, chronic pain, urinary symptoms, bladder injury, stent migration, the need for nephrostomy tube placement, MI, CVA, DVT, PE and the inherent risks with general anesthesia. The patient voices understanding and wishes to proceed.

## 2023-02-06 NOTE — Anesthesia Preprocedure Evaluation (Addendum)
Anesthesia Evaluation  Patient identified by MRN, date of birth, ID band Patient awake    Reviewed: Allergy & Precautions, NPO status , Patient's Chart, lab work & pertinent test results  Airway Mallampati: III  TM Distance: >3 FB Neck ROM: Full    Dental  (+) Edentulous Lower, Edentulous Upper, Dental Advisory Given   Pulmonary asthma , sleep apnea and Continuous Positive Airway Pressure Ventilation , COPD   Pulmonary exam normal breath sounds clear to auscultation       Cardiovascular hypertension, Pt. on medications and Pt. on home beta blockers + Peripheral Vascular Disease  + Valvular Problems/Murmurs (Mod TR) MR  Rhythm:Regular Rate:Tachycardia  Echho 08/2021  1. Left ventricular ejection fraction, by estimation, is 60 to 65%. The left ventricle has normal function. The left ventricle has no regional wall motion abnormalities. Left ventricular diastolic parameters are consistent with Grade I diastolic dysfunction (impaired relaxation).   2. Right ventricular systolic function is normal. The right ventricular size is normal.   3. The mitral valve is degenerative. Mild to moderate mitral valve regurgitation.   4. Tricuspid valve regurgitation is moderate.   5. The aortic valve is normal in structure. Aortic valve regurgitation is mild.   6. Aortic no significant ascending aortic aneurysm.   7. The inferior vena cava is normal in size with greater than 50% respiratory variability, suggesting right atrial pressure of 3 mmHg.     Neuro/Psych  Headaches PSYCHIATRIC DISORDERS Anxiety Depression    CVA    GI/Hepatic Neg liver ROS,GERD  ,,  Endo/Other  diabetes, Type 2, Insulin Dependent    Renal/GU Renal disease     Musculoskeletal  (+) Arthritis ,    Abdominal  (+) + obese  Peds  Hematology  (+) Blood dyscrasia, anemia   Anesthesia Other Findings   Reproductive/Obstetrics                              Anesthesia Physical Anesthesia Plan  ASA: 3 and emergent  Anesthesia Plan: General   Post-op Pain Management:    Induction: Intravenous  PONV Risk Score and Plan: 4 or greater and Ondansetron, Dexamethasone, Midazolam and Treatment may vary due to age or medical condition  Airway Management Planned: LMA  Additional Equipment:   Intra-op Plan:   Post-operative Plan: Extubation in OR  Informed Consent: I have reviewed the patients History and Physical, chart, labs and discussed the procedure including the risks, benefits and alternatives for the proposed anesthesia with the patient or authorized representative who has indicated his/her understanding and acceptance.     Dental advisory given  Plan Discussed with: CRNA  Anesthesia Plan Comments:         Anesthesia Quick Evaluation

## 2023-02-06 NOTE — Transfer of Care (Signed)
Immediate Anesthesia Transfer of Care Note  Patient: Anna [name]  Procedure(s) Performed: CYSTOSCOPY WITH RIGHT  RETROGRADE PYELOGRAM AND RIGHT URETERAL STENT EXCHANGE (Right: Bladder)  Patient Location: PACU  Anesthesia Type:General  Level of Consciousness: awake, alert , and patient cooperative  Airway & Oxygen Therapy: Patient Spontanous Breathing and Patient connected to face mask oxygen  Post-op Assessment: Report given to RN and Post -op Vital signs reviewed and stable  Post vital signs: Reviewed and stable  Last Vitals:  Vitals Value Taken Time  BP 137/93 02/06/23 1236  Temp    Pulse 77 02/06/23 1237  Resp 8 02/06/23 1237  SpO2 97 % 02/06/23 1237  Vitals shown include unfiled device data.  Last Pain:  Vitals:   02/06/23 1001  TempSrc:   PainSc: 0-No pain         Complications: No notable events documented.

## 2023-02-07 ENCOUNTER — Encounter (HOSPITAL_COMMUNITY): Payer: Self-pay | Admitting: Urology

## 2023-02-26 ENCOUNTER — Ambulatory Visit: Payer: Medicare (Managed Care) | Admitting: Podiatry

## 2023-04-04 ENCOUNTER — Ambulatory Visit: Payer: Medicare (Managed Care) | Admitting: Podiatry

## 2023-04-25 ENCOUNTER — Other Ambulatory Visit: Payer: Self-pay | Admitting: Urology

## 2023-05-14 NOTE — Progress Notes (Signed)
Anesthesia Review:  PCP:  Cardiologist :  Patricia Nettle, PAC LOV 11/05/22  Chest x-ray : 1v- 10/04/22  EKG : 09/30/22  Echo : 08/25/21  Peripheral Vasc Cath- 08/22/21  Stress test: Cardiac Cath :  Activity level:  Sleep Study/ CPAP : Fasting Blood Sugar :      / Checks Blood Sugar -- times a day:   Blood Thinner/ Instructions /Last Dose: ASA / Instructions/ Last Dose :  81 mg aspirin Plavix-    DM- type  Hgba1c-  Metformin0 none day of usrgery  Toujeo- hs-  Dulaglutide- Saturdays  Novolog- tid-    02/06/23- Cysto  Latex Allergy

## 2023-05-15 NOTE — Patient Instructions (Signed)
SURGICAL WAITING ROOM VISITATION  Patients having surgery or a procedure may have no more than 2 support people in the waiting area - these visitors may rotate.    Children under the age of 42 must have an adult with them who is not the patient.  Due to an increase in RSV and influenza rates and associated hospitalizations, children ages 58 and under may not visit patients in [facility] hospitals.  Visitors with respiratory illnesses are discouraged from visiting and should remain at home.  If the patient needs to stay at the hospital during part of their recovery, the visitor guidelines for inpatient rooms apply. Pre-op nurse will coordinate an appropriate time for 1 support person to accompany patient in pre-op.  This support person may not rotate.    Please refer to the [facility] website for the visitor guidelines for Inpatients (after your surgery is over and you are in a regular room).       Your procedure is scheduled on:    Report to [facility] Main Entrance    Report to admitting at AM   Call this number if you have problems the morning of surgery [phone]   Do not eat food :After Midnight.   After Midnight you may have the following liquids until ______ AM/ PM DAY OF SURGERY  Water Non-Citrus Juices (without pulp, NO RED-Apple, White grape, White cranberry) Black Coffee (NO MILK/CREAM OR CREAMERS, sugar ok)  Clear Tea (NO MILK/CREAM OR CREAMERS, sugar ok) regular and decaf                             Plain Jell-O (NO RED)                                           Fruit ices (not with fruit pulp, NO RED)                                     Popsicles (NO RED)                                                               Sports drinks like Gatorade (NO RED)              Drink 2 Ensure/G2 drinks AT 10:00 PM the night before surgery.        The day of surgery:  Drink ONE (1) Pre-Surgery Clear Ensure or G2 at AM the morning of surgery. Drink in one  sitting. Do not sip.  This drink was given to you during your hospital  pre-op appointment visit. Nothing else to drink after completing the  Pre-Surgery Clear Ensure or G2.          If you have questions, please contact your surgeon's office.   FOLLOW BOWEL PREP AND ANY ADDITIONAL PRE OP INSTRUCTIONS YOU RECEIVED FROM YOUR SURGEON'S OFFICE!!!     Oral Hygiene is also important to reduce your risk of infection.  Remember - BRUSH YOUR TEETH THE MORNING OF SURGERY WITH YOUR REGULAR TOOTHPASTE  DENTURES WILL BE REMOVED PRIOR TO SURGERY PLEASE DO NOT APPLY "Poly grip" OR ADHESIVES!!!   Do NOT smoke after Midnight   Stop all vitamins and herbal supplements 7 days before surgery.   Take these medicines the morning of surgery with A SIP OF WATER:  inhalers as usual and bring, busprsar, prozAc, flonase if needed, flomax, protonix, nebulizer if needed, claritin if needed, antivert if needed, gabapentin, topamax                Metformin- none day of surgery             Toujeo- Take 1/2 dose nite before surgery             Dulaglutide- Last dose on               Novolog with meals- none am of surgery   DO NOT TAKE ANY ORAL DIABETIC MEDICATIONS DAY OF YOUR SURGERY  Bring CPAP mask and tubing day of surgery.                              You may not have any metal on your body including hair pins, jewelry, and body piercing             Do not wear make-up, lotions, powders, perfumes/cologne, or deodorant  Do not wear nail polish including gel and S&S, artificial/acrylic nails, or any other type of covering on natural nails including finger and toenails. If you have artificial nails, gel coating, etc. that needs to be removed by a nail salon please have this removed prior to surgery or surgery may need to be canceled/ delayed if the surgeon/ anesthesia feels like they are unable to be safely monitored.   Do not shave  48 hours prior to surgery.                Men may shave face and neck.   Do not bring valuables to the hospital. [place] IS NOT             RESPONSIBLE   FOR VALUABLES.   Contacts, glasses, dentures or bridgework may not be worn into surgery.   Bring small overnight bag day of surgery.   DO NOT BRING YOUR HOME MEDICATIONS TO THE HOSPITAL. PHARMACY WILL DISPENSE MEDICATIONS LISTED ON YOUR MEDICATION LIST TO YOU DURING YOUR ADMISSION IN THE HOSPITAL!    Patients discharged on the day of surgery will not be allowed to drive home.  Someone NEEDS to stay with you for the first 24 hours after anesthesia.   Special Instructions: Bring a copy of your healthcare power of attorney and living will documents the day of surgery if you haven't scanned them before.              Please read over the following fact sheets you were given: IF YOU HAVE QUESTIONS ABOUT YOUR PRE-OP INSTRUCTIONS PLEASE CALL [phone]   If you received a COVID test during your pre-op visit  it is requested that you wear a mask when out in public, stay away from anyone that may not be feeling well and notify your surgeon if you develop symptoms. If you test positive for Covid or have been in contact with anyone that has tested positive in the last 10 days please notify you surgeon.    [place] - Preparing  for Surgery Before surgery, you can play an important role.  Because skin is not sterile, your skin needs to be as free of germs as possible.  You can reduce the number of germs on your skin by washing with CHG (chlorahexidine gluconate) soap before surgery.  CHG is an antiseptic cleaner which kills germs and bonds with the skin to continue killing germs even after washing. Please DO NOT use if you have an allergy to CHG or antibacterial soaps.  If your skin becomes reddened/irritated stop using the CHG and inform your nurse when you arrive at Short Stay. Do not shave (including legs and underarms) for at least 48 hours prior to the first CHG shower.  You may  shave your face/neck. Please follow these instructions carefully:  1.  Shower with CHG Soap the night before surgery and the  morning of Surgery.  2.  If you choose to wash your hair, wash your hair first as usual with your  normal  shampoo.  3.  After you shampoo, rinse your hair and body thoroughly to remove the  shampoo.                           4.  Use CHG as you would any other liquid soap.  You can apply chg directly  to the skin and wash                       Gently with a scrungie or clean washcloth.  5.  Apply the CHG Soap to your body ONLY FROM THE NECK DOWN.   Do not use on face/ open                           Wound or open sores. Avoid contact with eyes, ears mouth and genitals (private parts).                       Wash face,  Genitals (private parts) with your normal soap.             6.  Wash thoroughly, paying special attention to the area where your surgery  will be performed.  7.  Thoroughly rinse your body with warm water from the neck down.  8.  DO NOT shower/wash with your normal soap after using and rinsing off  the CHG Soap.                9.  Pat yourself dry with a clean towel.            10.  Wear clean pajamas.            11.  Place clean sheets on your bed the night of your first shower and do not  sleep with pets. Day of Surgery : Do not apply any lotions/deodorants the morning of surgery.  Please wear clean clothes to the hospital/surgery center.  FAILURE TO FOLLOW THESE INSTRUCTIONS MAY RESULT IN THE CANCELLATION OF YOUR SURGERY PATIENT SIGNATURE_________________________________  NURSE SIGNATURE__________________________________  ________________________________________________________________________

## 2023-05-17 ENCOUNTER — Encounter (HOSPITAL_COMMUNITY)
Admission: RE | Admit: 2023-05-17 | Discharge: 2023-05-17 | Disposition: A | Discharge status: Home / Self Care | Payer: Medicare (Managed Care) | Source: Ambulatory Visit | Attending: Family Medicine | Admitting: Family Medicine

## 2023-06-02 ENCOUNTER — Emergency Department (HOSPITAL_COMMUNITY): Payer: Medicare (Managed Care)

## 2023-06-02 ENCOUNTER — Inpatient Hospital Stay (HOSPITAL_COMMUNITY): Payer: Medicare (Managed Care)

## 2023-06-02 ENCOUNTER — Encounter (HOSPITAL_COMMUNITY): Payer: Self-pay | Admitting: Emergency Medicine

## 2023-06-02 ENCOUNTER — Inpatient Hospital Stay (HOSPITAL_COMMUNITY)
Admission: EM | Admit: 2023-06-02 | Discharge: 2023-06-05 | DRG: 066 | Disposition: A | Discharge status: Skilled Nursing Facility | Payer: Medicare (Managed Care) | Attending: Internal Medicine | Admitting: Internal Medicine

## 2023-06-02 DIAGNOSIS — Z881 Allergy status to other antibiotic agents status: Secondary | ICD-10-CM

## 2023-06-02 DIAGNOSIS — D649 Anemia, unspecified: Secondary | ICD-10-CM | POA: Diagnosis present

## 2023-06-02 DIAGNOSIS — E875 Hyperkalemia: Secondary | ICD-10-CM | POA: Diagnosis present

## 2023-06-02 DIAGNOSIS — M47819 Spondylosis without myelopathy or radiculopathy, site unspecified: Secondary | ICD-10-CM | POA: Diagnosis present

## 2023-06-02 DIAGNOSIS — E11319 Type 2 diabetes mellitus with unspecified diabetic retinopathy without macular edema: Secondary | ICD-10-CM | POA: Diagnosis present

## 2023-06-02 DIAGNOSIS — E1140 Type 2 diabetes mellitus with diabetic neuropathy, unspecified: Secondary | ICD-10-CM | POA: Diagnosis present

## 2023-06-02 DIAGNOSIS — K219 Gastro-esophageal reflux disease without esophagitis: Secondary | ICD-10-CM | POA: Diagnosis present

## 2023-06-02 DIAGNOSIS — Z79899 Other long term (current) drug therapy: Secondary | ICD-10-CM

## 2023-06-02 DIAGNOSIS — R200 Anesthesia of skin: Secondary | ICD-10-CM | POA: Diagnosis not present

## 2023-06-02 DIAGNOSIS — I129 Hypertensive chronic kidney disease with stage 1 through stage 4 chronic kidney disease, or unspecified chronic kidney disease: Secondary | ICD-10-CM | POA: Diagnosis present

## 2023-06-02 DIAGNOSIS — Z7982 Long term (current) use of aspirin: Secondary | ICD-10-CM

## 2023-06-02 DIAGNOSIS — I6389 Other cerebral infarction: Secondary | ICD-10-CM | POA: Diagnosis not present

## 2023-06-02 DIAGNOSIS — W19XXXA Unspecified fall, initial encounter: Secondary | ICD-10-CM | POA: Diagnosis not present

## 2023-06-02 DIAGNOSIS — Z7984 Long term (current) use of oral hypoglycemic drugs: Secondary | ICD-10-CM

## 2023-06-02 DIAGNOSIS — E1165 Type 2 diabetes mellitus with hyperglycemia: Secondary | ICD-10-CM | POA: Diagnosis present

## 2023-06-02 DIAGNOSIS — Z794 Long term (current) use of insulin: Secondary | ICD-10-CM

## 2023-06-02 DIAGNOSIS — Z91040 Latex allergy status: Secondary | ICD-10-CM | POA: Diagnosis not present

## 2023-06-02 DIAGNOSIS — E1151 Type 2 diabetes mellitus with diabetic peripheral angiopathy without gangrene: Secondary | ICD-10-CM | POA: Diagnosis present

## 2023-06-02 DIAGNOSIS — Z993 Dependence on wheelchair: Secondary | ICD-10-CM | POA: Diagnosis not present

## 2023-06-02 DIAGNOSIS — Y92009 Unspecified place in unspecified non-institutional (private) residence as the place of occurrence of the external cause: Secondary | ICD-10-CM

## 2023-06-02 DIAGNOSIS — S01112A Laceration without foreign body of left eyelid and periocular area, initial encounter: Secondary | ICD-10-CM | POA: Diagnosis present

## 2023-06-02 DIAGNOSIS — E1122 Type 2 diabetes mellitus with diabetic chronic kidney disease: Secondary | ICD-10-CM | POA: Diagnosis present

## 2023-06-02 DIAGNOSIS — I10 Essential (primary) hypertension: Secondary | ICD-10-CM | POA: Diagnosis present

## 2023-06-02 DIAGNOSIS — Z8673 Personal history of transient ischemic attack (TIA), and cerebral infarction without residual deficits: Secondary | ICD-10-CM | POA: Diagnosis not present

## 2023-06-02 DIAGNOSIS — Z885 Allergy status to narcotic agent status: Secondary | ICD-10-CM

## 2023-06-02 DIAGNOSIS — E119 Type 2 diabetes mellitus without complications: Secondary | ICD-10-CM

## 2023-06-02 DIAGNOSIS — Z89512 Acquired absence of left leg below knee: Secondary | ICD-10-CM | POA: Diagnosis not present

## 2023-06-02 DIAGNOSIS — W010XXA Fall on same level from slipping, tripping and stumbling without subsequent striking against object, initial encounter: Secondary | ICD-10-CM | POA: Diagnosis present

## 2023-06-02 DIAGNOSIS — Z7902 Long term (current) use of antithrombotics/antiplatelets: Secondary | ICD-10-CM

## 2023-06-02 DIAGNOSIS — E785 Hyperlipidemia, unspecified: Secondary | ICD-10-CM | POA: Diagnosis present

## 2023-06-02 DIAGNOSIS — N1831 Chronic kidney disease, stage 3a: Secondary | ICD-10-CM | POA: Diagnosis present

## 2023-06-02 DIAGNOSIS — I6629 Occlusion and stenosis of unspecified posterior cerebral artery: Secondary | ICD-10-CM | POA: Diagnosis present

## 2023-06-02 DIAGNOSIS — I6381 Other cerebral infarction due to occlusion or stenosis of small artery: Secondary | ICD-10-CM | POA: Diagnosis present

## 2023-06-02 DIAGNOSIS — M50322 Other cervical disc degeneration at C5-C6 level: Secondary | ICD-10-CM | POA: Diagnosis present

## 2023-06-02 DIAGNOSIS — I639 Cerebral infarction, unspecified: Secondary | ICD-10-CM | POA: Diagnosis present

## 2023-06-02 DIAGNOSIS — I6501 Occlusion and stenosis of right vertebral artery: Secondary | ICD-10-CM | POA: Diagnosis present

## 2023-06-02 DIAGNOSIS — G4733 Obstructive sleep apnea (adult) (pediatric): Secondary | ICD-10-CM | POA: Diagnosis present

## 2023-06-02 LAB — CBC
HCT: 36.8 % (ref 36.0–46.0)
Hemoglobin: 11.6 g/dL — ABNORMAL LOW (ref 12.0–15.0)
MCH: 28.5 pg (ref 26.0–34.0)
MCHC: 31.5 g/dL (ref 30.0–36.0)
MCV: 90.4 fL (ref 80.0–100.0)
Platelets: 511 10*3/uL — ABNORMAL HIGH (ref 150–400)
RBC: 4.07 MIL/uL (ref 3.87–5.11)
RDW: 13.2 % (ref 11.5–15.5)
WBC: 10 10*3/uL (ref 4.0–10.5)
nRBC: 0 % (ref 0.0–0.2)

## 2023-06-02 LAB — PROTIME-INR
INR: 0.9 (ref 0.8–1.2)
Prothrombin Time: 12.5 s (ref 11.4–15.2)

## 2023-06-02 LAB — COMPREHENSIVE METABOLIC PANEL
ALT: 11 U/L (ref 0–44)
AST: 18 U/L (ref 15–41)
Albumin: 3.1 g/dL — ABNORMAL LOW (ref 3.5–5.0)
Alkaline Phosphatase: 47 U/L (ref 38–126)
Anion gap: 7 (ref 5–15)
BUN: 11 mg/dL (ref 8–23)
CO2: 24 mmol/L (ref 22–32)
Calcium: 9 mg/dL (ref 8.9–10.3)
Chloride: 106 mmol/L (ref 98–111)
Creatinine, Ser: 1.02 mg/dL — ABNORMAL HIGH (ref 0.44–1.00)
GFR, Estimated: 60 mL/min — ABNORMAL LOW (ref >60)
Glucose, Bld: 127 mg/dL — ABNORMAL HIGH (ref 70–99)
Potassium: 4.5 mmol/L (ref 3.5–5.1)
Sodium: 137 mmol/L (ref 135–145)
Total Bilirubin: 0.4 mg/dL (ref 0.0–1.2)
Total Protein: 6.5 g/dL (ref 6.5–8.1)

## 2023-06-02 LAB — I-STAT CHEM 8, ED
BUN: 13 mg/dL (ref 8–23)
Calcium, Ion: 1.07 mmol/L — ABNORMAL LOW (ref 1.15–1.40)
Chloride: 108 mmol/L (ref 98–111)
Creatinine, Ser: 1.1 mg/dL — ABNORMAL HIGH (ref 0.44–1.00)
Glucose, Bld: 129 mg/dL — ABNORMAL HIGH (ref 70–99)
HCT: 34 % — ABNORMAL LOW (ref 36.0–46.0)
Hemoglobin: 11.6 g/dL — ABNORMAL LOW (ref 12.0–15.0)
Potassium: 5.8 mmol/L — ABNORMAL HIGH (ref 3.5–5.1)
Sodium: 136 mmol/L (ref 135–145)
TCO2: 22 mmol/L (ref 22–32)

## 2023-06-02 LAB — I-STAT CG4 LACTIC ACID, ED
Lactic Acid, Venous: 2 mmol/L (ref 0.5–1.9)
Lactic Acid, Venous: 1.1 mmol/L (ref 0.5–1.9)

## 2023-06-02 LAB — HEMOGLOBIN A1C
Hgb A1c MFr Bld: 7.5 % — ABNORMAL HIGH (ref 4.8–5.6)
Mean Plasma Glucose: 168.55 mg/dL

## 2023-06-02 LAB — CBG MONITORING, ED: Glucose-Capillary: 112 mg/dL — ABNORMAL HIGH (ref 70–99)

## 2023-06-02 LAB — ETHANOL: Alcohol, Ethyl (B): <10 mg/dL (ref <10)

## 2023-06-02 MED ORDER — SODIUM CHLORIDE 0.9 % IV SOLN: INTRAVENOUS | Status: DC

## 2023-06-02 MED ORDER — ACETAMINOPHEN 650 MG RE SUPP: 650.0000 mg | RECTAL | Status: DC | PRN

## 2023-06-02 MED ORDER — ATORVASTATIN CALCIUM 40 MG PO TABS: 40.0000 mg | ORAL_TABLET | Freq: Every day | ORAL | Status: DC

## 2023-06-02 MED ORDER — CLOPIDOGREL BISULFATE 75 MG PO TABS
75.0000 mg | ORAL_TABLET | Freq: Every day | ORAL | Status: DC
  Administered 2023-06-03: 75 mg via ORAL
  Filled 2023-06-02: qty 1

## 2023-06-02 MED ORDER — IOHEXOL 350 MG/ML SOLN
75.0000 mL | Freq: Once | INTRAVENOUS | Status: AC | PRN
  Administered 2023-06-02: 75 mL via INTRAVENOUS

## 2023-06-02 MED ORDER — ACETAMINOPHEN 325 MG PO TABS
650.0000 mg | ORAL_TABLET | Freq: Once | ORAL | Status: AC
  Administered 2023-06-02: 650 mg via ORAL
  Filled 2023-06-02: qty 2

## 2023-06-02 MED ORDER — BUSPIRONE HCL 15 MG PO TABS
7.5000 mg | ORAL_TABLET | Freq: Two times a day (BID) | ORAL | Status: DC
  Administered 2023-06-03 – 2023-06-05 (×5): 7.5 mg via ORAL
  Filled 2023-06-02 (×7): qty 1

## 2023-06-02 MED ORDER — PANTOPRAZOLE SODIUM 40 MG IV SOLR
40.0000 mg | INTRAVENOUS | Status: DC
  Administered 2023-06-02: 40 mg via INTRAVENOUS
  Filled 2023-06-02: qty 10

## 2023-06-02 MED ORDER — ASPIRIN 81 MG PO TBEC
81.0000 mg | DELAYED_RELEASE_TABLET | Freq: Every day | ORAL | Status: DC
  Administered 2023-06-03 – 2023-06-05 (×3): 81 mg via ORAL
  Filled 2023-06-02 (×3): qty 1

## 2023-06-02 MED ORDER — TOPIRAMATE 25 MG PO TABS
25.0000 mg | ORAL_TABLET | Freq: Every day | ORAL | Status: DC
  Administered 2023-06-03: 25 mg via ORAL
  Filled 2023-06-02 (×2): qty 1

## 2023-06-02 MED ORDER — CLOPIDOGREL BISULFATE 300 MG PO TABS: 300.0000 mg | ORAL_TABLET | Freq: Every day | ORAL | Status: DC

## 2023-06-02 MED ORDER — INSULIN ASPART 100 UNIT/ML IJ SOLN
0.0000 [IU] | INTRAMUSCULAR | Status: DC
  Administered 2023-06-03: 2 [IU] via SUBCUTANEOUS
  Administered 2023-06-03: 3 [IU] via SUBCUTANEOUS
  Administered 2023-06-04 (×2): 2 [IU] via SUBCUTANEOUS
  Administered 2023-06-04: 3 [IU] via SUBCUTANEOUS
  Administered 2023-06-04: 2 [IU] via SUBCUTANEOUS

## 2023-06-02 MED ORDER — HEPARIN SODIUM (PORCINE) 5000 UNIT/ML IJ SOLN
5000.0000 [IU] | Freq: Two times a day (BID) | INTRAMUSCULAR | Status: DC
  Administered 2023-06-02 – 2023-06-05 (×6): 5000 [IU] via SUBCUTANEOUS
  Filled 2023-06-02 (×6): qty 1

## 2023-06-02 MED ORDER — LORAZEPAM 1 MG PO TABS
1.0000 mg | ORAL_TABLET | Freq: Once | ORAL | Status: AC
  Administered 2023-06-02: 1 mg via ORAL
  Filled 2023-06-02: qty 1

## 2023-06-02 MED ORDER — ROPINIROLE HCL 0.25 MG PO TABS
0.2500 mg | ORAL_TABLET | Freq: Every day | ORAL | Status: DC
  Administered 2023-06-03 – 2023-06-04 (×2): 0.25 mg via ORAL
  Filled 2023-06-02 (×3): qty 1

## 2023-06-02 MED ORDER — FLUOXETINE HCL 20 MG PO CAPS
40.0000 mg | ORAL_CAPSULE | Freq: Every day | ORAL | Status: DC
  Administered 2023-06-03 – 2023-06-05 (×3): 40 mg via ORAL
  Filled 2023-06-02 (×3): qty 2

## 2023-06-02 MED ORDER — CLOPIDOGREL BISULFATE 300 MG PO TABS
300.0000 mg | ORAL_TABLET | Freq: Once | ORAL | Status: AC
  Administered 2023-06-02: 300 mg via ORAL
  Filled 2023-06-02: qty 1

## 2023-06-02 MED ORDER — ACETAMINOPHEN 325 MG PO TABS
650.0000 mg | ORAL_TABLET | ORAL | Status: DC | PRN
  Administered 2023-06-02 – 2023-06-04 (×4): 650 mg via ORAL
  Filled 2023-06-02 (×6): qty 2

## 2023-06-02 MED ORDER — STROKE: EARLY STAGES OF RECOVERY BOOK: Freq: Once | Status: DC

## 2023-06-02 MED ORDER — ACETAMINOPHEN 160 MG/5ML PO SOLN: 650.0000 mg | ORAL | Status: DC | PRN

## 2023-06-02 MED ORDER — LIDOCAINE HCL (PF) 1 % IJ SOLN
5.0000 mL | Freq: Once | INTRAMUSCULAR | Status: AC
  Administered 2023-06-02: 5 mL
  Filled 2023-06-02: qty 5

## 2023-06-02 MED ORDER — TAMSULOSIN HCL 0.4 MG PO CAPS
0.4000 mg | ORAL_CAPSULE | Freq: Every day | ORAL | Status: DC
  Administered 2023-06-03 – 2023-06-05 (×3): 0.4 mg via ORAL
  Filled 2023-06-02 (×3): qty 1

## 2023-06-02 MED ORDER — PANTOPRAZOLE SODIUM 40 MG PO TBEC
40.0000 mg | DELAYED_RELEASE_TABLET | Freq: Two times a day (BID) | ORAL | Status: DC
Start: 2023-06-02 — End: 2023-06-02

## 2023-06-02 NOTE — ED Notes (Addendum)
 Trauma Response Nurse Documentation   Anna [name] is a 68 y.o. female arriving to [facility] ED via EMS [facility]  On clopidogrel 75 mg daily. Trauma was activated as a Level 2 by ED Charge RN based on the following trauma criteria Elderly patients > 65 with head trauma on anti-coagulation (excluding ASA).  Patient cleared for CT by Dr. Theresia Lo. Pt transported to CT with trauma response nurse present to monitor. RN remained with the patient throughout their absence from the department for clinical observation.   GCS 15.  History   Past Medical History:  Diagnosis Date   Anemia    Anxiety    Arthritis    Asthma    Back pain    Bronchitis    Bursitis    Chronic kidney disease    COPD (chronic obstructive pulmonary disease) (HCC)    Depression    Diabetes mellitus    Diabetic neuropathy (HCC)    Diabetic retinopathy    GERD (gastroesophageal reflux disease)    Hyperlipemia    Hypertension    Migraine    Obesity    Pneumonia    Sepsis due to urinary tract infection (HCC)    Stroke (HCC)    2020     Past Surgical History:  Procedure Laterality Date   ABDOMINAL AORTOGRAM W/LOWER EXTREMITY Left 08/22/2021   Procedure: ABDOMINAL AORTOGRAM W/LOWER EXTREMITY;  Surgeon: Nada Libman, MD;  Location: [facility] INVASIVE CV LAB;  Service: Cardiovascular;  Laterality: Left;   AMPUTATION Left 10/11/2021   Procedure: LEFT BELOW KNEE AMPUTATION;  Surgeon: Nadara Mustard, MD;  Location: [facility] OR;  Service: Orthopedics;  Laterality: Left;   CESAREAN SECTION     x3   CHOLECYSTECTOMY     CYSTOSCOPY WITH RETROGRADE PYELOGRAM, URETEROSCOPY AND STENT PLACEMENT Right 02/06/2023   Procedure: CYSTOSCOPY WITH RIGHT  RETROGRADE PYELOGRAM AND RIGHT URETERAL STENT EXCHANGE;  Surgeon: Rene Paci, MD;  Location: [facility] ORS;  Service: Urology;  Laterality: Right;  30 MINUTES   CYSTOSCOPY WITH STENT PLACEMENT Right 09/30/2022   Procedure: CYSTOSCOPY WITH STENT PLACEMENT;  Surgeon: Crista Elliot, MD;  Location: [facility] OR;  Service: Urology;  Laterality: Right;   EYE SURGERY Left    retina surgery had laser   METATARSAL HEAD EXCISION Left 08/24/2021   Procedure: METATARSAL HEAD EXCISION FIFTH TOE;  Surgeon: Vivi Barrack, DPM;  Location: [facility] OR;  Service: Podiatry;  Laterality: Left;   PERIPHERAL VASCULAR BALLOON ANGIOPLASTY  08/22/2021   Procedure: PERIPHERAL VASCULAR BALLOON ANGIOPLASTY;  Surgeon: Nada Libman, MD;  Location: [facility] INVASIVE CV LAB;  Service: Cardiovascular;;  Left AT   WOUND DEBRIDEMENT Left 08/24/2021   Procedure: DEBRIDEMENT WOUND OF LEFT FOOT;  Surgeon: Vivi Barrack, DPM;  Location: [facility] OR;  Service: Podiatry;  Laterality: Left;     Initial Focused Assessment (If applicable, or please see trauma documentation): Airway: intact, patent Breathing: Breath sounds clear, equal bilaterally, RA, no SOB. Circulation: Approx 3cm lac to L eyebrow, bleeding currently controlled. Pulses intact centrally and peripherally.  Previous LLE BKA. Disability: PERRLA, MAE equally. A/Ox4 C/O HA and L hip pain, L side weakness VS WDL  CT's Completed:   CT Head and CT C-Spine   Interventions:  CXR Pelvic XR L femur XR CT head and c-spine Labs drawn Cleansed eyebrow lac/dried blood Tylenol given MRI done - revealing acute infarct   Plan for disposition:  Other Awaiting scan results   Consults completed:  Neurology @ 1311.  Event Summary: Pt was BIB [facility] after having a fall off of the couch while attempting to get in her WC.  Pt unsure as to how long she was down for and unsure if + LOC.  Pt struck her left eyebrow and landed on her left hip.  Pt states she has been having left sided weakness for approx 2 weeks and has a hx of CVA.  Pt arrived to ED in C-collar, VS WDL.   Bedside handoff with ED RN Seward Grater.    Janora Norlander  Trauma Response RN  Please call TRN at [phone] for further assistance.

## 2023-06-02 NOTE — ED Notes (Signed)
Patient transported to CT with TRN.  

## 2023-06-02 NOTE — ED Triage Notes (Signed)
 Pt arrives via EMS [facility] from home with a fall and found on floor. Unknown downtime. Pt reports of left sided weakness for 2 weeks. Hx of CVA. Pt unsure if LOC. Pt on plavix.

## 2023-06-02 NOTE — H&P (Addendum)
 History and Physical    Patient: Anna [name]:[id number] DOB: [birth year] DOA: 06/02/2023 DOS: the patient was seen and examined on 06/02/2023 PCP: [facility]  Patient coming from: Home Chief complaint: Chief Complaint  Patient presents with   Fall   HPI:  Anna [name] is a 68 y.o. female with past medical history of allergies to 14 medications please see complete list, history of CVA on Plavix, diabetes, hypertension, left leg BKA, COPD living at home comes today for fall patient was trying to get up transfer herself from the couch to the wheelchair which she normally keeps on the right side but today she lost her balance.  She did hit her face but not her head no loss of consciousness does have some mild headache and pain in her left side left buttock.  Reports that a few days ago she was having diarrhea vomiting and took Zofran and the next day she noticed that her face and left arm were numb.  Patient does have a history of a stroke and has baseline weakness on the left side on Wednesday that has been persistent  But no other reports of headaches blurred vision chest pain shortness of breath palpitations speech issues.  >>ED Course: Vital signs in the ED were notable for the following:  Vitals on arrival have remained stable with O2 sats of 96% on room air afebrile heart rate 78 blood pressure 127/52. Labs were notable for the following:  BMP showing potassium 5.8 glucose 129 creatinine 1.10 ionized calcium 1.07 lactic acid of 2.0 hemoglobin of 11.6 platelets of 511  EKG:  EKG shows sinus rhythm at 87 PR of 133 low voltage precordial leads, QRS 81 QTc 424 significant Q waves in lead V1, no ST-T wave changes. Imaging and additional notable ED work-up:  Patient had a CT head which was negative for any acute intracranial abnormalities, cervical spine CT also negative for any intracranial abnormality mild chronic small vessel disease and no evidence  of fracture or subluxation, MRI does however show positive for small acute lacunar infarcts of the central right thalamus, no hemorrhage or mass effect. While in the ED, the following were administered:  Medications  atorvastatin (LIPITOR) tablet 40 mg (has no administration in time range)  acetaminophen (TYLENOL) tablet 650 mg (650 mg Oral Given 06/02/23 1051)  lidocaine (PF) (XYLOCAINE) 1 % injection 5 mL (5 mLs Infiltration Given 06/02/23 1051)  LORazepam (ATIVAN) tablet 1 mg (1 mg Oral Given 06/02/23 1136)  clopidogrel (PLAVIX) tablet 300 mg (300 mg Oral Given 06/02/23 1344)   Review of Systems  Musculoskeletal:  Positive for falls.  Neurological:  Positive for sensory change and weakness.   Past Medical History:  Diagnosis Date   Anemia    Anxiety    Arthritis    Asthma    Back pain    Bronchitis    Bursitis    Chronic kidney disease    COPD (chronic obstructive pulmonary disease) (HCC)    Depression    Diabetes mellitus    Diabetic neuropathy (HCC)    Diabetic retinopathy    GERD (gastroesophageal reflux disease)    Hyperlipemia    Hypertension    Migraine    Obesity    Pneumonia    Sepsis due to urinary tract infection (HCC)    Sepsis secondary to UTI (HCC) 09/30/2022   Stroke (HCC)    2020   Past Surgical History:  Procedure Laterality Date   ABDOMINAL  AORTOGRAM W/LOWER EXTREMITY Left 08/22/2021   Procedure: ABDOMINAL AORTOGRAM W/LOWER EXTREMITY;  Surgeon: Nada Libman, MD;  Location: [facility];  Service: Cardiovascular;  Laterality: Left;   AMPUTATION Left 10/11/2021   Procedure: LEFT BELOW KNEE AMPUTATION;  Surgeon: Nadara Mustard, MD;  Location: [facility] OR;  Service: Orthopedics;  Laterality: Left;   CESAREAN SECTION     x3   CHOLECYSTECTOMY     CYSTOSCOPY WITH RETROGRADE PYELOGRAM, URETEROSCOPY AND STENT PLACEMENT Right 02/06/2023   Procedure: CYSTOSCOPY WITH RIGHT  RETROGRADE PYELOGRAM AND RIGHT URETERAL STENT EXCHANGE;  Surgeon: Rene Paci, MD;  Location: [facility] ORS;  Service: Urology;  Laterality: Right;  30 MINUTES   CYSTOSCOPY WITH STENT PLACEMENT Right 09/30/2022   Procedure: CYSTOSCOPY WITH STENT PLACEMENT;  Surgeon: Crista Elliot, MD;  Location: [facility] OR;  Service: Urology;  Laterality: Right;   EYE SURGERY Left    retina surgery had laser   METATARSAL HEAD EXCISION Left 08/24/2021   Procedure: METATARSAL HEAD EXCISION FIFTH TOE;  Surgeon: Vivi Barrack, DPM;  Location: [facility] OR;  Service: Podiatry;  Laterality: Left;   PERIPHERAL VASCULAR BALLOON ANGIOPLASTY  08/22/2021   Procedure: PERIPHERAL VASCULAR BALLOON ANGIOPLASTY;  Surgeon: Nada Libman, MD;  Location: [facility];  Service: Cardiovascular;;  Left AT   WOUND DEBRIDEMENT Left 08/24/2021   Procedure: DEBRIDEMENT WOUND OF LEFT FOOT;  Surgeon: Vivi Barrack, DPM;  Location: [facility] OR;  Service: Podiatry;  Laterality: Left;    reports that she has never smoked. She has never used smokeless tobacco. She reports that she does not drink alcohol and does not use drugs.  Allergies  Allergen Reactions   Lasix [Furosemide] Shortness Of Breath and Rash   Bacitracin     Burns skin     Citalopram Diarrhea   Doxycycline Other (See Comments)    "burning" all over body     Morphine And Codeine Hives   Neomycin     Used for pink eye, may pink eye worse     Ciprofloxacin     Tongue swells   Ciprofloxacin-Dexamethasone Other (See Comments)    Tongue swelling    Duloxetine     Mental Status Changes (intolerance) "saw pictures of gun"   Latex Itching   Levaquin [Levofloxacin In D5w] Other (See Comments)    Pt does not remember reaction but states she's had issues with med   Other     White pepper- uncontrollable sneezing    Salvia Officinalis     Sage- sneezing    Soma [Carisoprodol]     Sleepy and constipation      History reviewed. No pertinent family history.  Prior to Admission medications   Medication Sig Start Date End Date Taking?  Authorizing Provider  acetaminophen (TYLENOL) 500 MG tablet Take 1,000-1,500 mg by mouth every 8 (eight) hours as needed for moderate pain (pain score 4-6).    [provider]  albuterol (VENTOLIN HFA) 108 (90 Base) MCG/ACT inhaler Inhale 1-2 puffs into the lungs every 6 (six) hours as needed for wheezing or shortness of breath. Patient taking differently: Inhale 2 puffs into the lungs every 6 (six) hours as needed for wheezing or shortness of breath. 10/04/22   Leroy Sea, MD  Apoaequorin (PREVAGEN) 10 MG CAPS Take 10 mg by mouth daily.    [provider]  aspirin EC 81 MG tablet Take 81 mg by mouth daily. Swallow whole.    [provider]  Aspirin-Acetaminophen-Caffeine (GOODY HEADACHE PO) Take 2 packets by mouth daily as needed (headaches).    [provider]  atorvastatin (LIPITOR) 40 MG tablet Take 40 mg by mouth at bedtime.    [provider]  benzonatate (TESSALON) 100 MG capsule Take 100 mg by mouth 3 (three) times daily as needed for cough.    [provider]  busPIRone (BUSPAR) 7.5 MG tablet Take 7.5 mg by mouth 2 (two) times daily.    [provider]  clopidogrel (PLAVIX) 75 MG tablet Take 75 mg by mouth daily.    [provider]  Dulaglutide 3 MG/0.5ML SOPN Inject 3 mg into the skin every Saturday.    [provider]  estradiol (ESTRACE) 0.1 MG/GM vaginal cream Place 1 Applicatorful vaginally as needed (dryness / irritation).    [provider]  ferrous sulfate 325 (65 FE) MG tablet Take 325 mg by mouth daily.    [provider]  FLUoxetine (PROZAC) 40 MG capsule Take 40 mg by mouth daily.    [provider]  fluticasone (FLONASE) 50 MCG/ACT nasal spray Place 1 spray into both nostrils daily as needed for allergies or rhinitis.    [provider]  gabapentin (NEURONTIN) 400 MG capsule Take 400 mg by mouth 3 (three) times daily.    [provider]   hydrOXYzine (ATARAX) 10 MG tablet Take 1 tablet (10 mg total) by mouth 2 (two) times daily. Patient taking differently: Take 10 mg by mouth 2 (two) times daily as needed for itching or anxiety. 10/13/21   Rhetta Mura, MD  ibuprofen (ADVIL) 200 MG tablet Take 400 mg by mouth every 6 (six) hours as needed for moderate pain (pain score 4-6) or headache.    [provider]  insulin aspart (NOVOLOG) 100 UNIT/ML FlexPen Use as directed before each meal 3 times a day: 140-199 - 2 units, 200-250 - 4 units, 251-299 - 6 units,  300-349 - 8 units,  350 or above 10 units. Patient taking differently: Inject 2-6 Units into the skin 3 (three) times daily as needed for high blood sugar. 10/04/22   Leroy Sea, MD  insulin glargine, 1 Unit Dial, (TOUJEO SOLOSTAR) 300 UNIT/ML Solostar Pen Inject 25 Units into the skin at bedtime.    [provider]  Insulin Pen Needle 32G X 4 MM MISC Use to inject insulin as directed 10/04/22   Leroy Sea, MD  ipratropium-albuterol (DUONEB) 0.5-2.5 (3) MG/3ML SOLN Use 3 ml by nebulization twice a day scheduled and every 4 hours as needed for shortness of breath and wheezing 10/04/22   Leroy Sea, MD  Lactobacillus Rhamnosus, GG, (CULTURELLE HEALTH & WELLNESS) CAPS Take 1 capsule by mouth daily. 08/05/21   [provider]  lidocaine 4 % Place 1 patch onto the skin daily as needed (pain).    [provider]  lisinopril (ZESTRIL) 5 MG tablet Take 5 mg by mouth daily.    [provider]  loratadine (CLARITIN) 10 MG tablet Take 10 mg by mouth daily as needed for allergies.    [provider]  meclizine (ANTIVERT) 25 MG tablet Take 25 mg by mouth daily as needed for dizziness. 10/01/19   [provider]  Menthol, Topical Analgesic, (BIOFREEZE EX) Apply 1 Application topically daily as needed (neck and shoulder pain).    [provider]  metFORMIN (GLUCOPHAGE) 1000 MG tablet Take 1,000 mg by mouth 2  (two) times daily with a meal.    [provider]  naproxen sodium (ALEVE) 220 MG tablet Take 440 mg by mouth daily as needed (migraines).    [provider]  nystatin (MYCOSTATIN) 100000 UNIT/ML suspension Take 1 mL by mouth 4 (four) times daily as needed (red gums).    [provider]  nystatin cream (MYCOSTATIN) Apply 1 application. topically 4 (four) times daily as needed for dry skin.    [provider]  ondansetron (ZOFRAN-ODT) 4 MG disintegrating tablet Take 4 mg by mouth every 8 (eight) hours as needed for nausea or vomiting.    [provider]  pantoprazole (PROTONIX) 40 MG tablet Take 40 mg by mouth 2 (two) times daily.    [provider]  rOPINIRole (REQUIP) 0.25 MG tablet Take 0.25 mg by mouth at bedtime.    [provider]  sennosides-docusate sodium (SENOKOT-S) 8.6-50 MG tablet Take 1 tablet by mouth 2 (two) times daily as needed for constipation.    [provider]  tamsulosin (FLOMAX) 0.4 MG CAPS capsule Take 1 capsule (0.4 mg total) by mouth daily. 10/04/22   Leroy Sea, MD  topiramate (TOPAMAX) 25 MG tablet Take 1 tablet (25 mg total) by mouth daily. 10/13/21   Rhetta Mura, MD                                                                                   Vitals:   06/02/23 1800 06/02/23 1837 06/02/23 2006 06/02/23 2012  BP: (!) 111/56   113/75  Pulse: 78   74  Resp:    14  Temp:  98.5 F (36.9 C) 97.9 F (36.6 C)   TempSrc:  Oral Oral   SpO2: 96%   93%  Weight:      Height:       Physical Exam Vitals and nursing note reviewed.  Constitutional:      General: She is not in acute distress. HENT:     Head: Normocephalic and atraumatic.      Right Ear: Hearing normal.     Left Ear: Hearing normal.     Nose: Nose normal. No nasal deformity.     Mouth/Throat:     Lips: Pink.     Tongue: No lesions.     Pharynx: Oropharynx is clear.  Eyes:     General: Lids are normal. No visual  field deficit.    Extraocular Movements: Extraocular movements intact.  Cardiovascular:     Rate and Rhythm: Normal rate and regular rhythm.     Heart sounds: Normal heart sounds.  Pulmonary:     Effort: Pulmonary effort is normal.     Breath sounds: Normal breath sounds.  Abdominal:     General: Bowel sounds are normal. There is no distension.     Palpations: Abdomen is soft. There is no mass.     Tenderness: There is no abdominal tenderness.  Musculoskeletal:     Right lower leg: No edema.     Left lower leg: No edema.  Skin:    General: Skin is warm.  Neurological:     General: No focal deficit present.     Mental Status: She is alert and oriented to person, place,  and time.     Cranial Nerves: Cranial nerves 2-12 are intact. No cranial nerve deficit, dysarthria or facial asymmetry.     Sensory: Sensory deficit present.     Motor: No weakness or tremor.  Psychiatric:        Attention and Perception: Attention normal.        Mood and Affect: Mood normal.        Speech: Speech normal.        Behavior: Behavior normal. Behavior is cooperative.    Labs on Admission: I have personally reviewed following labs and imaging studies  CBC: Recent Labs  Lab 06/02/23 1001 06/02/23 1012  WBC 10.0  --   HGB 11.6* 11.6*  HCT 36.8 34.0*  MCV 90.4  --   PLT 511*  --    Basic Metabolic Panel: Recent Labs  Lab 06/02/23 1012 06/02/23 1350  NA 136 137  K 5.8* 4.5  CL 108 106  CO2  --  24  GLUCOSE 129* 127*  BUN 13 11  CREATININE 1.10* 1.02*  CALCIUM  --  9.0   GFR: Estimated Creatinine Clearance: 47.3 mL/min (A) (by C-G formula based on SCr of 1.02 mg/dL (H)). Liver Function Tests: Recent Labs  Lab 06/02/23 1350  AST 18  ALT 11  ALKPHOS 47  BILITOT 0.4  PROT 6.5  ALBUMIN 3.1*   No results for input(s): "LIPASE", "AMYLASE" in the last 168 hours. No results for input(s): "AMMONIA" in the last 168 hours. Coagulation Profile: Recent Labs  Lab 06/02/23 1001  INR  0.9   Cardiac Enzymes: No results for input(s): "CKTOTAL", "CKMB", "CKMBINDEX", "TROPONINI" in the last 168 hours. BNP (last 3 results) No results for input(s): "PROBNP" in the last 8760 hours. HbA1C: Recent Labs    06/02/23 1001  HGBA1C 7.5*   CBG: Recent Labs  Lab 06/02/23 2002  GLUCAP 112*   Lipid Profile: No results for input(s): "CHOL", "HDL", "LDLCALC", "TRIG", "CHOLHDL", "LDLDIRECT" in the last 72 hours. Thyroid Function Tests: No results for input(s): "TSH", "T4TOTAL", "FREET4", "T3FREE", "THYROIDAB" in the last 72 hours. Anemia Panel: No results for input(s): "VITAMINB12", "FOLATE", "FERRITIN", "TIBC", "IRON", "RETICCTPCT" in the last 72 hours. Urine analysis:    Component Value Date/Time   COLORURINE YELLOW 09/30/2022 0900   APPEARANCEUR HAZY (A) 09/30/2022 0900   LABSPEC 1.012 09/30/2022 0900   PHURINE 6.0 09/30/2022 0900   GLUCOSEU NEGATIVE 09/30/2022 0900   HGBUR SMALL (A) 09/30/2022 0900   BILIRUBINUR NEGATIVE 09/30/2022 0900   KETONESUR NEGATIVE 09/30/2022 0900   PROTEINUR 30 (A) 09/30/2022 0900   UROBILINOGEN 1.0 06/23/2014 1950   NITRITE POSITIVE (A) 09/30/2022 0900   LEUKOCYTESUR MODERATE (A) 09/30/2022 0900    Radiological Exams on Admission: CT ANGIO HEAD NECK W WO CM Result Date: 06/02/2023 CLINICAL DATA:  Stroke/TIA, determine embolic source EXAM: CT ANGIOGRAPHY HEAD AND NECK WITH AND WITHOUT CONTRAST TECHNIQUE: Multidetector CT imaging of the head and neck was performed using the standard protocol during bolus administration of intravenous contrast. Multiplanar CT image reconstructions and MIPs were obtained to evaluate the vascular anatomy. Carotid stenosis measurements (when applicable) are obtained utilizing NASCET criteria, using the distal internal carotid diameter as the denominator. RADIATION DOSE REDUCTION: This exam was performed according to the departmental dose-optimization program which includes automated exposure control, adjustment of  the mA and/or kV according to patient size and/or use of iterative reconstruction technique. CONTRAST:  75mL OMNIPAQUE IOHEXOL 350 MG/ML SOLN COMPARISON:  CT head March 9, 25. FINDINGS: CTA NECK  FINDINGS Aortic arch: Aortic atherosclerosis. Great vessel origins are patent without significant stenosis. Right carotid system: Atherosclerosis at the carotid bifurcation without greater than 50% stenosis. Left carotid system: Atherosclerosis at the carotid bifurcation without greater than 50% stenosis. Vertebral arteries: Moderate stenosis of the right vertebral artery at its origin and at C4 and C5 due to mass effect from adjacent osteophytes. Left vertebral artery is patent with severe origin stenosis and severe foraminal stenosis at C5-C6 due to mass effect from osteophytes. Skeleton: Severe multilevel degenerative change including posterior disc osteophyte complexes and facet/uncovertebral hypertrophy resulting in multilevel foraminal stenosis. Other neck: No acute abnormality on limited assessment. Upper chest: Visualized lung apices are clear. Review of the MIP images confirms the above findings CTA HEAD FINDINGS Anterior circulation: Bilateral intracranial ICAs, MCAs, and ACAs are patent without proximal hemodynamically significant stenosis. Posterior circulation: Bilateral intradural vertebral arteries are patent. Severe proximal left intradural vertebral artery stenosis. Basilar artery and bilateral posterior cerebral arteries are patent. 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 Venous sinuses: As permitted by contrast timing, patent. Review of the MIP images confirms the above findings IMPRESSION: 1. No emergent large vessel occlusion. 2. 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 3. 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 4.  Aortic Atherosclerosis (ICD10-I70.0). Electronically Signed   By: Feliberto Harts M.D.   On: 06/02/2023 20:01   MR Brain Wo  Contrast (neuro protocol) Result Date: 06/02/2023 CLINICAL DATA:  68 year old female neurologic deficit. Fall. Blunt plate trauma. Head and neck pain. EXAM: MRI HEAD WITHOUT CONTRAST TECHNIQUE: Multiplanar, multiecho pulse sequences of the brain and surrounding structures were obtained without intravenous contrast. COMPARISON:  CT head and cervical spine 0928 hours today. Brain MRI 08/26/2021. FINDINGS: Brain: Small subcentimeter, 4-5 mm slightly subtle focus of restricted diffusion in the central thalamus (series 5, image 72 and series 6, image 24). No other diffusion restriction. Mild T2 and FLAIR hyperintensity there, superimposed on nearby chronic lacunar infarct of the posterior right internal capsule which was present in 2023. Contralateral chronic left lacunar infarct is stable. Other scattered bilateral cerebral white matter and occasionally cortical (series 11, image 29), insular (series 11, image 28) T2 and FLAIR hyperintensity is stable since 2023. moderate patchy T2 heterogeneity in the pons is stable. No chronic cerebral blood products. Negative cerebellum for age. No midline shift, mass effect, evidence of mass lesion, ventriculomegaly, extra-axial collection or acute intracranial hemorrhage. Cervicomedullary junction and pituitary are within normal limits. Vascular: Major intracranial vascular flow voids are stable. Skull and upper cervical spine: Negative for age visible cervical spine. Visualized bone marrow signal is within normal limits. Sinuses/Orbits: Stable, negative. Other: Negative visible scalp and face. IMPRESSION: 1. Positive for small Acute lacunar infarct of the central right thalamus. No hemorrhage or mass effect. 2. Underlying advanced chronic small vessel disease otherwise not significantly changed since 2023. Electronically Signed   By: Odessa Fleming M.D.   On: 06/02/2023 12:36   CT HEAD WO CONTRAST Result Date: 06/02/2023 CLINICAL DATA:  Fall.  Blunt plate trauma.  Head and neck pain.  EXAM: CT HEAD WITHOUT CONTRAST CT CERVICAL SPINE WITHOUT CONTRAST TECHNIQUE: Multidetector CT imaging of the head and cervical spine was performed following the standard protocol without intravenous contrast. Multiplanar CT image reconstructions of the cervical spine were also generated. RADIATION DOSE REDUCTION: This exam was performed according to the departmental dose-optimization program which includes automated exposure control, adjustment of the mA and/or kV according to patient size  and/or use of iterative reconstruction technique. COMPARISON:  11/23/2012 FINDINGS: CT HEAD FINDINGS Brain: No evidence of intracranial hemorrhage, acute infarction, hydrocephalus, extra-axial collection, or mass lesion/mass effect. Mild chronic small vessel disease shows progression since 2014. Vascular:  No hyperdense vessel or other acute findings. Skull: No evidence of fracture or other significant bone abnormality. Sinuses/Orbits:  No acute findings. Other: None. CT CERVICAL SPINE FINDINGS Alignment: Normal. Skull base and vertebrae: No acute fracture. No primary bone lesion or focal pathologic process. Soft tissues and spinal canal: No prevertebral fluid or swelling. No visible canal hematoma. Disc levels: Moderate degenerative disc disease again seen at C5-6 and C6-7, with prominent osteophytosis. Moderate left-sided facet DJD is seen at C2-3 and C3-4, increased since prior exam. Upper chest: No acute findings. Other: None. IMPRESSION: No acute intracranial abnormality. Mild chronic small vessel disease. No evidence of cervical spine fracture or subluxation. Moderate degenerative disc disease at C5-6 and C6-7. Moderate left-sided facet DJD at C2-3 and C3-4. Electronically Signed   By: Danae Orleans M.D.   On: 06/02/2023 10:48   CT CERVICAL SPINE WO CONTRAST Result Date: 06/02/2023 CLINICAL DATA:  Fall.  Blunt plate trauma.  Head and neck pain. EXAM: CT HEAD WITHOUT CONTRAST CT CERVICAL SPINE WITHOUT CONTRAST TECHNIQUE:  Multidetector CT imaging of the head and cervical spine was performed following the standard protocol without intravenous contrast. Multiplanar CT image reconstructions of the cervical spine were also generated. RADIATION DOSE REDUCTION: This exam was performed according to the departmental dose-optimization program which includes automated exposure control, adjustment of the mA and/or kV according to patient size and/or use of iterative reconstruction technique. COMPARISON:  11/23/2012 FINDINGS: CT HEAD FINDINGS Brain: No evidence of intracranial hemorrhage, acute infarction, hydrocephalus, extra-axial collection, or mass lesion/mass effect. Mild chronic small vessel disease shows progression since 2014. Vascular:  No hyperdense vessel or other acute findings. Skull: No evidence of fracture or other significant bone abnormality. Sinuses/Orbits:  No acute findings. Other: None. CT CERVICAL SPINE FINDINGS Alignment: Normal. Skull base and vertebrae: No acute fracture. No primary bone lesion or focal pathologic process. Soft tissues and spinal canal: No prevertebral fluid or swelling. No visible canal hematoma. Disc levels: Moderate degenerative disc disease again seen at C5-6 and C6-7, with prominent osteophytosis. Moderate left-sided facet DJD is seen at C2-3 and C3-4, increased since prior exam. Upper chest: No acute findings. Other: None. IMPRESSION: No acute intracranial abnormality. Mild chronic small vessel disease. No evidence of cervical spine fracture or subluxation. Moderate degenerative disc disease at C5-6 and C6-7. Moderate left-sided facet DJD at C2-3 and C3-4. Electronically Signed   By: Danae Orleans M.D.   On: 06/02/2023 10:48   DG Pelvis Portable Result Date: 06/02/2023 CLINICAL DATA:  Pain after trauma EXAM: PORTABLE PELVIS 1 VIEWS COMPARISON:  CT chest abdomen pelvis 09/30/2022. FINDINGS: No fracture or dislocation. Preserved joint spaces. Osteopenia. Vascular calcification seen. Right-sided  ureteral stent in place. Degenerative changes of the visualized lumbar spine IMPRESSION: No acute osseous abnormality. Electronically Signed   By: Karen Kays M.D.   On: 06/02/2023 10:22   DG FEMUR PORT 1V LEFT Result Date: 06/02/2023 CLINICAL DATA:  Pain after trauma EXAM: LEFT FEMUR PORTABLE 1 VIEW COMPARISON:  None Available. FINDINGS: Osteopenia. No fracture or dislocation. Degenerative changes seen about the new particularly with joint space loss of the medial compartment. Scattered vascular calcifications. IMPRESSION: Osteopenia. Degenerative changes of the knee joint. Limited one view exam Electronically Signed   By: Karen Kays M.D.   On:  06/02/2023 10:21   DG Chest Port 1 View Result Date: 06/02/2023 CLINICAL DATA:  Fall.  Found down. EXAM: PORTABLE CHEST 1 VIEW COMPARISON:  10/04/2022 FINDINGS: The lungs are clear without focal pneumonia, edema, pneumothorax or pleural effusion. Minimal atelectasis noted left base. The cardiopericardial silhouette is within normal limits for size. No acute bony abnormality. Telemetry leads overlie the chest. IMPRESSION: Minimal left base atelectasis. Otherwise no acute cardiopulmonary findings. Electronically Signed   By: Kennith Center M.D.   On: 06/02/2023 10:20   Data Reviewed: Relevant notes from primary care and specialist visits, past discharge summaries as available in EHR, including Care Everywhere. Prior diagnostic testing as pertinent to current admission diagnoses, Updated medications and problem lists for reconciliation ED course, including vitals, labs, imaging, treatment and response to treatment,Triage notes, nursing and pharmacy notes and ED provider's notes Notable results as noted in HPI.Discussed case with EDMD/ ED APP/ or Specialty MD on call and as needed.  >>Principal Problem:   Fall at home, initial encounter Active Problems:   Acute CVA (cerebrovascular accident) [facility])   Essential hypertension   Hyperkalemia   Type 2 diabetes  mellitus (HCC)   OSA (obstructive sleep apnea)   GERD (gastroesophageal reflux disease)  >> Fall at home/acute CVA:  Pt + for right thalamic stroke.  Admit to telemetry unit with cardiac monitoring and pulse oximetry with vitals. Patient seen by neurology stat and started on dual antiplatelet therapy regiment with aspirin and Plavix loading of 300 followed by daily 75. Patient also continued on statin therapy with Lipitor 40 mg.  LFTs rechecked were normal. OT PT speech therapy as needed swallow evaluation at bedside to started diet. Appreciate neurology consult and management-defer to neuro for additional imaging that may be needed i.e. carotid Dopplers. Will obtain a 2D echocardiogram with bubble study.    >> Essential hypertension: Vitals:   06/02/23 1430 06/02/23 1445 06/02/23 1500 06/02/23 1515  BP: 122/64 (!) 125/58 132/68 122/68   06/02/23 1530 06/02/23 1645 06/02/23 1700 06/02/23 1715  BP: (!) 105/50 (!) 120/55 126/61 127/65   06/02/23 1730 06/02/23 1745 06/02/23 1800 06/02/23 2012  BP: (!) 114/54 (!) 118/94 (!) 111/56 113/75  Due to soft blood pressures eval patient's lisinopril will monitor overnight.  >>DM II: A1c, glycemic Protocol, bedside swallow, consistent carb diet when able.  >> Hypokalemia: Will repeat and follow.  Currently lisinopril held.   >>AKI: Mild Lab Results  Component Value Date   CREATININE 1.02 (H) 06/02/2023   CREATININE 1.10 (H) 06/02/2023   CREATININE 0.84 02/01/2023  Lisinopril held. Avoid contrast especially MRI. Continue with gentle IV fluid hydration as patient has needed CT angio contrast .   >> Anemia: Chronic stable anemia.  Suspect anemia of chronic disease. Will defer to PCP to evaluate while in the hospital will follow hemoglobin today is 11.6 and stable. IV PPI.  Aspiration precaution.  Fall precaution.  Will advised to refrain from using NSAIDs.    DVT prophylaxis:  Heparin Consults:  Neurology Advance Care Planning:     Code Status: Full Code   Family Communication:  None Disposition Plan:  Home Severity of Illness: The appropriate patient status for this patient is INPATIENT. Inpatient status is judged to be reasonable and necessary in order to provide the required intensity of service to ensure the patient's safety. The patient's presenting symptoms, physical exam findings, and initial radiographic and laboratory data in the context of their chronic comorbidities is felt to place them at high risk for further  clinical deterioration. Furthermore, it is not anticipated that the patient will be medically stable for discharge from the hospital within 2 midnights of admission.   * I certify that at the point of admission it is my clinical judgment that the patient will require inpatient hospital care spanning beyond 2 midnights from the point of admission due to high intensity of service, high risk for further deterioration and high frequency of surveillance required.*  Author: Gertha Calkin, MD 06/02/2023 8:15 PM  For on call review [email or web address].   Unresulted Labs (From admission, onward)     Start     Ordered   06/03/23 0500  Lipid panel  (Labs)  Tomorrow morning,   R       Comments: Fasting    06/02/23 1524   06/02/23 1525  Urine rapid drug screen (hosp performed)not at [facility])  Once,   R        06/02/23 1524   06/02/23 1006  Urinalysis, Routine w reflex microscopic -Urine, Clean Catch  [facility] ED TRAUMA PANEL [facility])  Once,   URGENT       Question:  Specimen Source  Answer:  Urine, Clean Catch   06/02/23 1006            Orders Placed This Encounter  Procedures   LACERATION REPAIR   DG Chest Port 1 View   DG Pelvis Portable   CT HEAD WO CONTRAST   CT CERVICAL SPINE WO CONTRAST   DG FEMUR PORT 1V LEFT   MR Brain Wo Contrast (neuro protocol)   CT ANGIO HEAD NECK W WO CM   CBC   Ethanol   Urinalysis, Routine w reflex microscopic -Urine, Clean Catch   Protime-INR   Comprehensive  metabolic panel   Urine rapid drug screen (hosp performed)not at [facility]   Lipid panel   Hemoglobin A1c   ED Cardiac monitoring   Measure blood pressure   Initiate Carrier Fluid Protocol   Suture tray to patient bedside   Suture cart   Irrigate/clean wound   NIHSS score documentation NIHSS score range: 0-42   Vital signs   Notify physician (specify)   OOB with assistance   Activity as tolerated   Swallow screen - If patient does NOT pass this screen, place order for SLP eval and treat (SLP2) - swallowing evaluation (BSE, MBS and/or diet order as indicated)   NIH Stroke Scale   Intake and output   Cardiac Monitoring Continuous x 24 hours Indications for use: Acute neurological event   Apply Stroke Care Plan: Ischemic Stroke, TIA   Initiate Adult Central Line Maintenance and Catheter Protocol for patients with central line (CVC, PICC, Port, Hemodialysis, Trialysis)   Discuss with patient and document patient's goals for stroke risk factor reduction   Initiate Oral Care Protocol   Initiate Carrier Fluid Protocol   Provide stroke education material to patient and family.   Nurse to provide smoking / tobacco cessation education   If the patient has passed the Stroke Swallow Screen or has a feeding tube, then RN may order General Admission PRN Orders (through manage orders) for the following patient needs: allergy symptoms (Claritin), cold sores (Carmex), cough (Robitussin DM), eye irritation (Liquifilm Tears), hemorrhoids (Tucks), indigestion (Maalox), minor skin irritation (hydrocortisone cream), muscle pain Romeo Apple Gay), nose irritation (saline nasal spray) and sore throat (Chloraseptic spray).   Apply Diabetes Mellitus Care Plan   STAT CBG when hypoglycemia is suspected. If treated, recheck every 15 minutes after each treatment  until CBG >/= 70 mg/dl   Refer to Hypoglycemia Protocol Sidebar Report for treatment of CBG < 70 mg/dl   Full code   Consult to neurology   Consult for [facility] Admission   Consult to Registered Dietitian   Consult to Transition of Care Team   OT eval and treat   PT eval and treat   Oxygen therapy Mode or (Route): Nasal cannula; Liters Per Minute: 2; Keep O2 saturation between: greater than 94 %   SLP eval and treat Reason for evaluation: Cognitive/Language evaluation   I-Stat Chem 8, ED   I-Stat Lactic Acid, ED   CBG monitoring, ED   EKG 12-Lead   ECHOCARDIOGRAM COMPLETE BUBBLE STUDY   Admit to Inpatient (patient's expected length of stay will be greater than 2 midnights or inpatient only procedure)   Fall precautions   Aspiration precautions

## 2023-06-02 NOTE — ED Notes (Signed)
 Pt brief changed.

## 2023-06-02 NOTE — Progress Notes (Signed)
 [facility] Progress Note Patient Details:  Anna [name] [birth year] [id number]  Patient ID: Anna [name], female   DOB: 1955-09-03, 68 y.o.   MRN: [id number] Level II; not needed. Darleen Crocker 06/02/2023, 11:50 AM

## 2023-06-02 NOTE — ED Notes (Signed)
 Patient transported to MRI

## 2023-06-02 NOTE — ED Provider Notes (Signed)
 [place] EMERGENCY DEPARTMENT AT [facility] Provider Note   CSN: [id number] Arrival date & time: 06/02/23  1191     History  Chief Complaint  Patient presents with  . Fall    Anna [name] is a 68 y.o. female.  Patient is a 68 year old female with a past medical history of CVA on Plavix, diabetes, hypertension, left leg BKA, COPD presenting to the emergency department after a fall.  Patient states that she was trying to get up from the couch into her wheelchair this morning.  She states normally she has her wheelchair on her right side but today it was on her left and she lost her balance and fell hitting her face.  She denies any loss of consciousness.  The patient states that she has a mild headache and some mild pain in her left buttock.  Of note the patient also reports for about the since Wednesday she has been having numbness on the left side of her face and left arm.  She states that she woke up with diarrhea and vomiting about 10 days ago and took some Zofran and went back to bed and when she woke up in the morning her left side of her face and left arm were numb.  She initially thought that it was stress related but states that the symptoms have not gone away.  She states that she has not noticed any associated weakness or numbness in her left leg.  She states that her previous stroke was on the left side.  The history is provided by the patient.  Fall       Home Medications Prior to Admission medications   Medication Sig Start Date End Date Taking? Authorizing Provider  acetaminophen (TYLENOL) 500 MG tablet Take 1,000-1,500 mg by mouth every 8 (eight) hours as needed for moderate pain (pain score 4-6).    [provider]  albuterol (VENTOLIN HFA) 108 (90 Base) MCG/ACT inhaler Inhale 1-2 puffs into the lungs every 6 (six) hours as needed for wheezing or shortness of breath. Patient taking differently: Inhale 2 puffs into the lungs every 6 (six) hours as  needed for wheezing or shortness of breath. 10/04/22   Leroy Sea, MD  Apoaequorin (PREVAGEN) 10 MG CAPS Take 10 mg by mouth daily.    [provider]  aspirin EC 81 MG tablet Take 81 mg by mouth daily. Swallow whole.    [provider]  Aspirin-Acetaminophen-Caffeine (GOODY HEADACHE PO) Take 2 packets by mouth daily as needed (headaches).    [provider]  atorvastatin (LIPITOR) 40 MG tablet Take 40 mg by mouth at bedtime.    [provider]  benzonatate (TESSALON) 100 MG capsule Take 100 mg by mouth 3 (three) times daily as needed for cough.    [provider]  busPIRone (BUSPAR) 7.5 MG tablet Take 7.5 mg by mouth 2 (two) times daily.    [provider]  clopidogrel (PLAVIX) 75 MG tablet Take 75 mg by mouth daily.    [provider]  Dulaglutide 3 MG/0.5ML SOPN Inject 3 mg into the skin every Saturday.    [provider]  estradiol (ESTRACE) 0.1 MG/GM vaginal cream Place 1 Applicatorful vaginally as needed (dryness / irritation).    [provider]  ferrous sulfate 325 (65 FE) MG tablet Take 325 mg by mouth daily.    [provider]  FLUoxetine (PROZAC) 40 MG capsule Take 40 mg by mouth daily.  [provider]  fluticasone (FLONASE) 50 MCG/ACT nasal spray Place 1 spray into both nostrils daily as needed for allergies or rhinitis.    [provider]  gabapentin (NEURONTIN) 400 MG capsule Take 400 mg by mouth 3 (three) times daily.    [provider]  hydrOXYzine (ATARAX) 10 MG tablet Take 1 tablet (10 mg total) by mouth 2 (two) times daily. Patient taking differently: Take 10 mg by mouth 2 (two) times daily as needed for itching or anxiety. 10/13/21   Rhetta Mura, MD  ibuprofen (ADVIL) 200 MG tablet Take 400 mg by mouth every 6 (six) hours as needed for moderate pain (pain score 4-6) or headache.    [provider]  insulin aspart (NOVOLOG) 100 UNIT/ML  FlexPen Use as directed before each meal 3 times a day: 140-199 - 2 units, 200-250 - 4 units, 251-299 - 6 units,  300-349 - 8 units,  350 or above 10 units. Patient taking differently: Inject 2-6 Units into the skin 3 (three) times daily as needed for high blood sugar. 10/04/22   Leroy Sea, MD  insulin glargine, 1 Unit Dial, (TOUJEO SOLOSTAR) 300 UNIT/ML Solostar Pen Inject 25 Units into the skin at bedtime.    [provider]  Insulin Pen Needle 32G X 4 MM MISC Use to inject insulin as directed 10/04/22   Leroy Sea, MD  ipratropium-albuterol (DUONEB) 0.5-2.5 (3) MG/3ML SOLN Use 3 ml by nebulization twice a day scheduled and every 4 hours as needed for shortness of breath and wheezing 10/04/22   Leroy Sea, MD  Lactobacillus Rhamnosus, GG, (CULTURELLE HEALTH & WELLNESS) CAPS Take 1 capsule by mouth daily. 08/05/21   [provider]  lidocaine 4 % Place 1 patch onto the skin daily as needed (pain).    [provider]  lisinopril (ZESTRIL) 5 MG tablet Take 5 mg by mouth daily.    [provider]  loratadine (CLARITIN) 10 MG tablet Take 10 mg by mouth daily as needed for allergies.    [provider]  meclizine (ANTIVERT) 25 MG tablet Take 25 mg by mouth daily as needed for dizziness. 10/01/19   [provider]  Menthol, Topical Analgesic, (BIOFREEZE EX) Apply 1 Application topically daily as needed (neck and shoulder pain).    [provider]  metFORMIN (GLUCOPHAGE) 1000 MG tablet Take 1,000 mg by mouth 2 (two) times daily with a meal.    [provider]  naproxen sodium (ALEVE) 220 MG tablet Take 440 mg by mouth daily as needed (migraines).    [provider]  nystatin (MYCOSTATIN) 100000 UNIT/ML suspension Take 1 mL by mouth 4 (four) times daily as needed (red gums).    [provider]  nystatin cream (MYCOSTATIN) Apply 1 application. topically 4 (four) times daily as needed for dry skin.     [provider]  ondansetron (ZOFRAN-ODT) 4 MG disintegrating tablet Take 4 mg by mouth every 8 (eight) hours as needed for nausea or vomiting.    [provider]  pantoprazole (PROTONIX) 40 MG tablet Take 40 mg by mouth 2 (two) times daily.    [provider]  rOPINIRole (REQUIP) 0.25 MG tablet Take 0.25 mg by mouth at bedtime.    [provider]  sennosides-docusate sodium (SENOKOT-S) 8.6-50 MG tablet Take 1 tablet by mouth 2 (two) times daily as needed for constipation.    [provider]  tamsulosin (FLOMAX) 0.4 MG CAPS capsule Take 1 capsule (0.4 mg  total) by mouth daily. 10/04/22   Leroy Sea, MD  topiramate (TOPAMAX) 25 MG tablet Take 1 tablet (25 mg total) by mouth daily. 10/13/21   Rhetta Mura, MD      Allergies    Lasix [furosemide], Bacitracin, Citalopram, Doxycycline, Morphine and codeine, Neomycin, Ciprofloxacin, Ciprofloxacin-dexamethasone, Duloxetine, Latex, Levaquin [levofloxacin in d5w], Other, Salvia officinalis, and Soma [carisoprodol]    Review of Systems   Review of Systems  Physical Exam Updated Vital Signs BP (!) 127/52   Pulse 78   Temp 98.2 F (36.8 C) (Oral)   Resp 14   Ht 5\' 1"  (1.549 m)   Wt 70 kg   SpO2 96%   BMI 29.16 kg/m  Physical Exam Vitals and nursing note reviewed.  Constitutional:      General: She is not in acute distress.    Appearance: Normal appearance.  HENT:     Head: Normocephalic.     Comments: ~3 cm non-gaping, non-bleeding laceration to L eyebrow    Nose: Nose normal.     Mouth/Throat:     Mouth: Mucous membranes are moist.     Pharynx: Oropharynx is clear.  Eyes:     Extraocular Movements: Extraocular movements intact.     Conjunctiva/sclera: Conjunctivae normal.     Pupils: Pupils are equal, round, and reactive to light.  Neck:     Comments: No midline neck tenderness, c-collar in place Cardiovascular:     Rate and Rhythm: Normal rate and regular rhythm.     Heart  sounds: Normal heart sounds.  Pulmonary:     Effort: Pulmonary effort is normal.     Breath sounds: Normal breath sounds.  Abdominal:     General: Abdomen is flat.     Palpations: Abdomen is soft.     Tenderness: There is no abdominal tenderness.  Musculoskeletal:        General: Normal range of motion.     Comments: No midline back tenderness No bony tenderness to bilateral UE or RLE Pelvis stable, non-tender Tenderness to palpation of L lateral hip and buttocks, mild pain with internal/external rotation LLE BKA  Skin:    General: Skin is warm and dry.  Neurological:     Mental Status: She is alert and oriented to person, place, and time.     Comments: Subjectively decreased sensation of left side of the face and left upper extremity, no obvious facial droop, normal speech, no drift in all 4 extremities  Psychiatric:        Mood and Affect: Mood normal.        Behavior: Behavior normal.     ED Results / Procedures / Treatments   Labs (all labs ordered are listed, but only abnormal results are displayed) Labs Reviewed  CBC - Abnormal; Notable for the following components:      Result Value   Hemoglobin 11.6 (*)    Platelets 511 (*)    All other components within normal limits  I-STAT CHEM 8, ED - Abnormal; Notable for the following components:   Potassium 5.8 (*)    Creatinine, Ser 1.10 (*)    Glucose, Bld 129 (*)    Calcium, Ion 1.07 (*)    Hemoglobin 11.6 (*)    HCT 34.0 (*)    All other components within normal limits  I-STAT CG4 LACTIC ACID, ED - Abnormal; Notable for the following components:   Lactic Acid, Venous 2.0 (*)    All other components within normal limits  ETHANOL  PROTIME-INR  URINALYSIS, ROUTINE W REFLEX MICROSCOPIC  COMPREHENSIVE METABOLIC PANEL  I-STAT CG4 LACTIC ACID, ED    EKG EKG Interpretation Date/Time:  Sunday June 02 2023 10:03:31 EDT Ventricular Rate:  87 PR Interval:  133 QRS Duration:  81 QT Interval:  352 QTC  Calculation: 424 R Axis:   78  Text Interpretation: Sinus rhythm Low voltage, precordial leads No significant change since last tracing Confirmed by Elayne Snare ([id number]) on 06/02/2023 10:24:07 AM  Radiology MR Brain Wo Contrast (neuro protocol) Result Date: 06/02/2023 CLINICAL DATA:  68 year old female neurologic deficit. Fall. Blunt plate trauma. Head and neck pain. EXAM: MRI HEAD WITHOUT CONTRAST TECHNIQUE: Multiplanar, multiecho pulse sequences of the brain and surrounding structures were obtained without intravenous contrast. COMPARISON:  CT head and cervical spine 0928 hours today. Brain MRI 08/26/2021. FINDINGS: Brain: Small subcentimeter, 4-5 mm slightly subtle focus of restricted diffusion in the central thalamus (series 5, image 72 and series 6, image 24). No other diffusion restriction. Mild T2 and FLAIR hyperintensity there, superimposed on nearby chronic lacunar infarct of the posterior right internal capsule which was present in 2023. Contralateral chronic left lacunar infarct is stable. Other scattered bilateral cerebral white matter and occasionally cortical (series 11, image 29), insular (series 11, image 28) T2 and FLAIR hyperintensity is stable since 2023. moderate patchy T2 heterogeneity in the pons is stable. No chronic cerebral blood products. Negative cerebellum for age. No midline shift, mass effect, evidence of mass lesion, ventriculomegaly, extra-axial collection or acute intracranial hemorrhage. Cervicomedullary junction and pituitary are within normal limits. Vascular: Major intracranial vascular flow voids are stable. Skull and upper cervical spine: Negative for age visible cervical spine. Visualized bone marrow signal is within normal limits. Sinuses/Orbits: Stable, negative. Other: Negative visible scalp and face. IMPRESSION: 1. Positive for small Acute lacunar infarct of the central right thalamus. No hemorrhage or mass effect. 2. Underlying advanced chronic small vessel  disease otherwise not significantly changed since 2023. Electronically Signed   By: Odessa Fleming M.D.   On: 06/02/2023 12:36   CT HEAD WO CONTRAST Result Date: 06/02/2023 CLINICAL DATA:  Fall.  Blunt plate trauma.  Head and neck pain. EXAM: CT HEAD WITHOUT CONTRAST CT CERVICAL SPINE WITHOUT CONTRAST TECHNIQUE: Multidetector CT imaging of the head and cervical spine was performed following the standard protocol without intravenous contrast. Multiplanar CT image reconstructions of the cervical spine were also generated. RADIATION DOSE REDUCTION: This exam was performed according to the departmental dose-optimization program which includes automated exposure control, adjustment of the mA and/or kV according to patient size and/or use of iterative reconstruction technique. COMPARISON:  11/23/2012 FINDINGS: CT HEAD FINDINGS Brain: No evidence of intracranial hemorrhage, acute infarction, hydrocephalus, extra-axial collection, or mass lesion/mass effect. Mild chronic small vessel disease shows progression since 2014. Vascular:  No hyperdense vessel or other acute findings. Skull: No evidence of fracture or other significant bone abnormality. Sinuses/Orbits:  No acute findings. Other: None. CT CERVICAL SPINE FINDINGS Alignment: Normal. Skull base and vertebrae: No acute fracture. No primary bone lesion or focal pathologic process. Soft tissues and spinal canal: No prevertebral fluid or swelling. No visible canal hematoma. Disc levels: Moderate degenerative disc disease again seen at C5-6 and C6-7, with prominent osteophytosis. Moderate left-sided facet DJD is seen at C2-3 and C3-4, increased since prior exam. Upper chest: No acute findings. Other: None. IMPRESSION: No acute intracranial abnormality. Mild chronic small vessel disease. No evidence of cervical spine fracture or subluxation. Moderate degenerative disc disease at C5-6 and C6-7. Moderate  left-sided facet DJD at C2-3 and C3-4. Electronically Signed   By: Danae Orleans M.D.   On: 06/02/2023 10:48   CT CERVICAL SPINE WO CONTRAST Result Date: 06/02/2023 CLINICAL DATA:  Fall.  Blunt plate trauma.  Head and neck pain. EXAM: CT HEAD WITHOUT CONTRAST CT CERVICAL SPINE WITHOUT CONTRAST TECHNIQUE: Multidetector CT imaging of the head and cervical spine was performed following the standard protocol without intravenous contrast. Multiplanar CT image reconstructions of the cervical spine were also generated. RADIATION DOSE REDUCTION: This exam was performed according to the departmental dose-optimization program which includes automated exposure control, adjustment of the mA and/or kV according to patient size and/or use of iterative reconstruction technique. COMPARISON:  11/23/2012 FINDINGS: CT HEAD FINDINGS Brain: No evidence of intracranial hemorrhage, acute infarction, hydrocephalus, extra-axial collection, or mass lesion/mass effect. Mild chronic small vessel disease shows progression since 2014. Vascular:  No hyperdense vessel or other acute findings. Skull: No evidence of fracture or other significant bone abnormality. Sinuses/Orbits:  No acute findings. Other: None. CT CERVICAL SPINE FINDINGS Alignment: Normal. Skull base and vertebrae: No acute fracture. No primary bone lesion or focal pathologic process. Soft tissues and spinal canal: No prevertebral fluid or swelling. No visible canal hematoma. Disc levels: Moderate degenerative disc disease again seen at C5-6 and C6-7, with prominent osteophytosis. Moderate left-sided facet DJD is seen at C2-3 and C3-4, increased since prior exam. Upper chest: No acute findings. Other: None. IMPRESSION: No acute intracranial abnormality. Mild chronic small vessel disease. No evidence of cervical spine fracture or subluxation. Moderate degenerative disc disease at C5-6 and C6-7. Moderate left-sided facet DJD at C2-3 and C3-4. Electronically Signed   By: Danae Orleans M.D.   On: 06/02/2023 10:48   DG Pelvis Portable Result Date:  06/02/2023 CLINICAL DATA:  Pain after trauma EXAM: PORTABLE PELVIS 1 VIEWS COMPARISON:  CT chest abdomen pelvis 09/30/2022. FINDINGS: No fracture or dislocation. Preserved joint spaces. Osteopenia. Vascular calcification seen. Right-sided ureteral stent in place. Degenerative changes of the visualized lumbar spine IMPRESSION: No acute osseous abnormality. Electronically Signed   By: Karen Kays M.D.   On: 06/02/2023 10:22   DG FEMUR PORT 1V LEFT Result Date: 06/02/2023 CLINICAL DATA:  Pain after trauma EXAM: LEFT FEMUR PORTABLE 1 VIEW COMPARISON:  None Available. FINDINGS: Osteopenia. No fracture or dislocation. Degenerative changes seen about the new particularly with joint space loss of the medial compartment. Scattered vascular calcifications. IMPRESSION: Osteopenia. Degenerative changes of the knee joint. Limited one view exam Electronically Signed   By: Karen Kays M.D.   On: 06/02/2023 10:21   DG Chest Port 1 View Result Date: 06/02/2023 CLINICAL DATA:  Fall.  Found down. EXAM: PORTABLE CHEST 1 VIEW COMPARISON:  10/04/2022 FINDINGS: The lungs are clear without focal pneumonia, edema, pneumothorax or pleural effusion. Minimal atelectasis noted left base. The cardiopericardial silhouette is within normal limits for size. No acute bony abnormality. Telemetry leads overlie the chest. IMPRESSION: Minimal left base atelectasis. Otherwise no acute cardiopulmonary findings. Electronically Signed   By: Kennith Center M.D.   On: 06/02/2023 10:20    Procedures .Laceration Repair  Date/Time: 06/02/2023 11:14 AM  Performed by: Rexford Maus, DO Authorized by: Rexford Maus, DO   Consent:    Consent obtained:  Verbal   Consent given by:  Patient   Risks, benefits, and alternatives were discussed: yes     Risks discussed:  Infection, need for additional repair, poor cosmetic result, poor wound healing and pain   Alternatives discussed:  No treatment and delayed treatment Universal protocol:     Procedure explained and questions answered to patient or proxy's satisfaction: yes     Patient identity confirmed:  Verbally with patient Anesthesia:    Anesthesia method:  None Laceration details:    Location:  Face   Face location:  L eyebrow   Length (cm):  4   Depth (mm):  2 Pre-procedure details:    Preparation:  Imaging obtained to evaluate for foreign bodies Exploration:    Limited defect created (wound extended): yes     Hemostasis achieved with:  Direct pressure   Imaging obtained comment:  CTH   Imaging outcome: foreign body not noted     Wound exploration: wound explored through full range of motion and entire depth of wound visualized     Wound extent: areolar tissue not violated, fascia not violated, no foreign body, no signs of injury, no nerve damage, no tendon damage, no underlying fracture and no vascular damage     Contaminated: no   Treatment:    Area cleansed with:  Saline   Amount of cleaning:  Standard   Irrigation solution:  Sterile saline   Visualized foreign bodies/material removed: no     Debridement:  None   Undermining:  None   Scar revision: no   Skin repair:    Repair method:  Steri-Strips   Number of Steri-Strips:  5 Approximation:    Approximation:  Close Repair type:    Repair type:  Simple Post-procedure details:    Dressing:  Open (no dressing)   Procedure completion:  Tolerated well, no immediate complications     Medications Ordered in ED Medications  acetaminophen (TYLENOL) tablet 650 mg (650 mg Oral Given 06/02/23 1051)  lidocaine (PF) (XYLOCAINE) 1 % injection 5 mL (5 mLs Infiltration Given 06/02/23 1051)  LORazepam (ATIVAN) tablet 1 mg (1 mg Oral Given 06/02/23 1136)    ED Course/ Medical Decision Making/ A&P Clinical Course as of 06/02/23 1332  Sun Jun 02, 2023  1052 No acute traumatic injury on CT imaging. No obvious new stroke. Formal CMP pending but no severe electrolyte derangements on istat. Patient will be recommended MRI to  eval for stroke. [VK]  1116 Laceration minimally gaping, patient is very anxious about sutures and was agreeable to steri-strips for repair. [VK]  1244 MRI positive for acute lacunar infarct. Neurology will be consulted and patient will be recommended admission. [VK]  1331 Kirkpatrick recommended plavix load and hospitalist admission. [VK]    Clinical Course User Index [VK] Rexford Maus, DO                                 Medical Decision Making This patient presents to the ED with chief complaint(s) of fall, L-sided numbness with pertinent past medical history of CVA, DM, HTN, COPD, LLE BKA which further complicates the presenting complaint. The complaint involves an extensive differential diagnosis and also carries with it a high risk of complications and morbidity.    The differential diagnosis includes ICH, mass effect, hip fracture, pelvis fracture, no other traumatic injury seen on exam, with recent numbness concern for possible CVA, TIA, electrolyte abnormality  Additional history obtained: Additional history obtained from EMS  Records reviewed outpatient vascular records  ED Course and Reassessment: Patient was made a prehospital arrival level 2 trauma due to her fall on Plavix.  I evaluated the patient at bedside upon her  arrival.  Patient's primary survey was intact.  Secondary survey was notable for left eyebrow laceration as well as left-sided numbness and left hip/buttock pain.  Numbness preceded patient's fall and has been going on for several days prior to the fall.  Patient will have CT head/C-spine as well as chest, pelvis and left femur x-rays performed to evaluate for traumatic injury.  Patient was given Tylenol for pain.  She will additionally have labs performed to evaluate for cause of her numbness and she will be closely reassessed.  Tetanus is up-to-date is not require update today.  Independent labs interpretation:  The following labs were independently  interpreted: mildly elevated lactic, otherwise no acute abnormality  Independent visualization of imaging: - I independently visualized the following imaging with scope of interpretation limited to determining acute life threatening conditions related to emergency care: CTH/C-spine, MRI brain, CXR, Pelvis XR, L femur XR, which revealed no acute traumatic injury, acute CVA in R thalamus  Consultation: - Consulted or discussed management/test interpretation w/ external professional: neurology, hospitalist  Consideration for admission or further workup: patient requires admission for acute stroke Social Determinants of health: N/A    Amount and/or Complexity of Data Reviewed Labs: ordered. Radiology: ordered.  Risk OTC drugs. Prescription drug management. Decision regarding hospitalization.          Final Clinical Impression(s) / ED Diagnoses Final diagnoses:  Fall, initial encounter  Laceration of left eyebrow, initial encounter  Cerebrovascular accident (CVA), unspecified mechanism [facility])    Rx / DC Orders ED Discharge Orders     None         Rexford Maus, DO 06/02/23 1314

## 2023-06-02 NOTE — Consult Note (Signed)
 NEUROLOGY CONSULT NOTE   Date of service: June 02, 2023 Patient Name: Anna [name] MRN:  [id number] DOB:  Jul 06, 1955 Chief Complaint: "Fall" Requesting Provider: Rexford Maus, DO  History of Present Illness  Anna [name] is a 68 y.o. female with hx of HTN, HLD, DM, prior CVA, anxiety and depression, GERD, COPD, CKD, diabetic retinopathy, diabetic neuropathy, migraines, Left BKA, wheelchair bound who presents to [facility] via EMS after a fall hitting her head. Patient also c/o left face, and left arm numbness and she thinks some numbness on her left leg. Numbness started on Wednesday almost 2 weeks ago. She thought it was related to stress and anxiety. She states she saw a provider at [facility] and recommended her to go to the hospital if it should get worse. She denies any facial droop, dysarthria or vision problems. Today she states she had a fall, hitting the front of her head while attempting to transfer from the couch to her wheelchair. MRI brain revealed small lacunar infarct in the right thalamus. Neurology consulted   LKW: ~12 days ago Modified rankin score: 4-Needs assistance to walk and tend to bodily needs IV Thrombolysis: No outside window EVT:  No LVO  NIHSS components Score: Comment  1a Level of Conscious 0[x]  1[]  2[]  3[]      1b LOC Questions 0[x]  1[]  2[]       1c LOC Commands 0[x]  1[]  2[]       2 Best Gaze 0[x]  1[]  2[]       3 Visual 0[x]  1[]  2[]  3[]      4 Facial Palsy 0[x]  1[]  2[]  3[]      5a Motor Arm - left 0[x]  1[]  2[]  3[]  4[]  UN[]  Slightly weak grip strength   5b Motor Arm - Right 0[x]  1[]  2[]  3[]  4[]  UN[]    6a Motor Leg - Left 0[x]  1[]  2[]  3[]  4[]  UN[]    6b Motor Leg - Right 0[x]  1[]  2[]  3[]  4[]  UN[]    7 Limb Ataxia 0[x]  1[]  2[]  3[]  UN[]     8 Sensory 0[]  1[x]  2[]  UN[]    Left side of body and face  9 Best Language 0[x]  1[]  2[]  3[]      10 Dysarthria 0[x]  1[]  2[]  UN[]      11 Extinct. and Inattention 0[x]  1[]  2[]       TOTAL: 1      ROS   Comprehensive ROS  performed and pertinent positives documented in HPI    Past History   Past Medical History:  Diagnosis Date   Anemia    Anxiety    Arthritis    Asthma    Back pain    Bronchitis    Bursitis    Chronic kidney disease    COPD (chronic obstructive pulmonary disease) (HCC)    Depression    Diabetes mellitus    Diabetic neuropathy (HCC)    Diabetic retinopathy    GERD (gastroesophageal reflux disease)    Hyperlipemia    Hypertension    Migraine    Obesity    Pneumonia    Sepsis due to urinary tract infection (HCC)    Sepsis secondary to UTI (HCC) 09/30/2022   Stroke (HCC)    2020    Past Surgical History:  Procedure Laterality Date   ABDOMINAL AORTOGRAM W/LOWER EXTREMITY Left 08/22/2021   Procedure: ABDOMINAL AORTOGRAM W/LOWER EXTREMITY;  Surgeon: Nada Libman, MD;  Location: [facility] INV[facility]IVE [facility];  Service: Cardiovascular;  Laterality: Left;   AMPUTATION Left 10/11/2021   Procedure: LEFT BELOW KNEE AMPUTATION;  Surgeon: Nadara Mustard, MD;  Location: [facility] OR;  Service: Orthopedics;  Laterality: Left;   CESAREAN SECTION     x3   CHOLECYSTECTOMY     CYSTOSCOPY WITH RETROGRADE PYELOGRAM, URETEROSCOPY AND STENT PLACEMENT Right 02/06/2023   Procedure: CYSTOSCOPY WITH RIGHT  RETROGRADE PYELOGRAM AND RIGHT URETERAL STENT EXCHANGE;  Surgeon: Rene Paci, MD;  Location: [facility] ORS;  Service: Urology;  Laterality: Right;  30 MINUTES   CYSTOSCOPY WITH STENT PLACEMENT Right 09/30/2022   Procedure: CYSTOSCOPY WITH STENT PLACEMENT;  Surgeon: Crista Elliot, MD;  Location: [facility] OR;  Service: Urology;  Laterality: Right;   EYE SURGERY Left    retina surgery had laser   METATARSAL HEAD EXCISION Left 08/24/2021   Procedure: METATARSAL HEAD EXCISION FIFTH TOE;  Surgeon: Vivi Barrack, DPM;  Location: [facility] OR;  Service: Podiatry;  Laterality: Left;   PERIPHERAL VASCULAR BALLOON ANGIOPLASTY  08/22/2021   Procedure: PERIPHERAL VASCULAR BALLOON ANGIOPLASTY;  Surgeon: Nada Libman, MD;  Location: [facility];  Service: Cardiovascular;;  Left AT   WOUND DEBRIDEMENT Left 08/24/2021   Procedure: DEBRIDEMENT WOUND OF LEFT FOOT;  Surgeon: Vivi Barrack, DPM;  Location: [facility] OR;  Service: Podiatry;  Laterality: Left;    Family History: History reviewed. No pertinent family history.  Social History  reports that she has never smoked. She has never used smokeless tobacco. She reports that she does not drink alcohol and does not use drugs.  Allergies  Allergen Reactions   Lasix [Furosemide] Shortness Of Breath and Rash   Bacitracin     Burns skin     Citalopram Diarrhea   Doxycycline Other (See Comments)    "burning" all over body     Morphine And Codeine Hives   Neomycin     Used for pink eye, may pink eye worse     Ciprofloxacin     Tongue swells   Ciprofloxacin-Dexamethasone Other (See Comments)    Tongue swelling    Duloxetine     Mental Status Changes (intolerance) "saw pictures of gun"   Latex Itching   Levaquin [Levofloxacin In D5w] Other (See Comments)    Pt does not remember reaction but states she's had issues with med   Other     White pepper- uncontrollable sneezing    Salvia Officinalis     Sage- sneezing    Soma [Carisoprodol]     Sleepy and constipation      Medications  No current facility-administered medications for this encounter.  Current Outpatient Medications:    acetaminophen (TYLENOL) 500 MG tablet, Take 1,000-1,500 mg by mouth every 8 (eight) hours as needed for moderate pain (pain score 4-6)., Disp: , Rfl:    albuterol (VENTOLIN HFA) 108 (90 Base) MCG/ACT inhaler, Inhale 1-2 puffs into the lungs every 6 (six) hours as needed for wheezing or shortness of breath. (Patient taking differently: Inhale 2 puffs into the lungs every 6 (six) hours as needed for wheezing or shortness of breath.), Disp: 18 g, Rfl: 0   Apoaequorin (PREVAGEN) 10 MG CAPS, Take 10 mg by mouth daily., Disp: , Rfl:    aspirin EC 81 MG  tablet, Take 81 mg by mouth daily. Swallow whole., Disp: , Rfl:    Aspirin-Acetaminophen-Caffeine (GOODY HEADACHE PO), Take 2 packets by mouth daily as needed (headaches)., Disp: , Rfl:    atorvastatin (LIPITOR) 40 MG tablet, Take 40 mg by mouth at bedtime., Disp: , Rfl:    benzonatate (TESSALON) 100 MG capsule,  Take 100 mg by mouth 3 (three) times daily as needed for cough., Disp: , Rfl:    busPIRone (BUSPAR) 7.5 MG tablet, Take 7.5 mg by mouth 2 (two) times daily., Disp: , Rfl:    clopidogrel (PLAVIX) 75 MG tablet, Take 75 mg by mouth daily., Disp: , Rfl:    Dulaglutide 3 MG/0.5ML SOPN, Inject 3 mg into the skin every Saturday., Disp: , Rfl:    estradiol (ESTRACE) 0.1 MG/GM vaginal cream, Place 1 Applicatorful vaginally as needed (dryness / irritation)., Disp: , Rfl:    ferrous sulfate 325 (65 FE) MG tablet, Take 325 mg by mouth daily., Disp: , Rfl:    FLUoxetine (PROZAC) 40 MG capsule, Take 40 mg by mouth daily., Disp: , Rfl:    fluticasone (FLONASE) 50 MCG/ACT nasal spray, Place 1 spray into both nostrils daily as needed for allergies or rhinitis., Disp: , Rfl:    gabapentin (NEURONTIN) 400 MG capsule, Take 400 mg by mouth 3 (three) times daily., Disp: , Rfl:    hydrOXYzine (ATARAX) 10 MG tablet, Take 1 tablet (10 mg total) by mouth 2 (two) times daily. (Patient taking differently: Take 10 mg by mouth 2 (two) times daily as needed for itching or anxiety.), Disp: 30 tablet, Rfl: 0   ibuprofen (ADVIL) 200 MG tablet, Take 400 mg by mouth every 6 (six) hours as needed for moderate pain (pain score 4-6) or headache., Disp: , Rfl:    insulin aspart (NOVOLOG) 100 UNIT/ML FlexPen, Use as directed before each meal 3 times a day: 140-199 - 2 units, 200-250 - 4 units, 251-299 - 6 units,  300-349 - 8 units,  350 or above 10 units. (Patient taking differently: Inject 2-6 Units into the skin 3 (three) times daily as needed for high blood sugar.), Disp: 15 mL, Rfl: 0   insulin glargine, 1 Unit Dial, (TOUJEO  SOLOSTAR) 300 UNIT/ML Solostar Pen, Inject 25 Units into the skin at bedtime., Disp: , Rfl:    Insulin Pen Needle 32G X 4 MM MISC, Use to inject insulin as directed, Disp: 100 each, Rfl: 0   ipratropium-albuterol (DUONEB) 0.5-2.5 (3) MG/3ML SOLN, Use 3 ml by nebulization twice a day scheduled and every 4 hours as needed for shortness of breath and wheezing, Disp: 360 mL, Rfl: 0   Lactobacillus Rhamnosus, GG, (CULTURELLE HEALTH & WELLNESS) CAPS, Take 1 capsule by mouth daily., Disp: , Rfl:    lidocaine 4 %, Place 1 patch onto the skin daily as needed (pain)., Disp: , Rfl:    lisinopril (ZESTRIL) 5 MG tablet, Take 5 mg by mouth daily., Disp: , Rfl:    loratadine (CLARITIN) 10 MG tablet, Take 10 mg by mouth daily as needed for allergies., Disp: , Rfl:    meclizine (ANTIVERT) 25 MG tablet, Take 25 mg by mouth daily as needed for dizziness., Disp: , Rfl:    Menthol, Topical Analgesic, (BIOFREEZE EX), Apply 1 Application topically daily as needed (neck and shoulder pain)., Disp: , Rfl:    metFORMIN (GLUCOPHAGE) 1000 MG tablet, Take 1,000 mg by mouth 2 (two) times daily with a meal., Disp: , Rfl:    naproxen sodium (ALEVE) 220 MG tablet, Take 440 mg by mouth daily as needed (migraines)., Disp: , Rfl:    nystatin (MYCOSTATIN) 100000 UNIT/ML suspension, Take 1 mL by mouth 4 (four) times daily as needed (red gums)., Disp: , Rfl:    nystatin cream (MYCOSTATIN), Apply 1 application. topically 4 (four) times daily as needed for dry skin., Disp: ,  Rfl:    ondansetron (ZOFRAN-ODT) 4 MG disintegrating tablet, Take 4 mg by mouth every 8 (eight) hours as needed for nausea or vomiting., Disp: , Rfl:    pantoprazole (PROTONIX) 40 MG tablet, Take 40 mg by mouth 2 (two) times daily., Disp: , Rfl:    rOPINIRole (REQUIP) 0.25 MG tablet, Take 0.25 mg by mouth at bedtime., Disp: , Rfl:    sennosides-docusate sodium (SENOKOT-S) 8.6-50 MG tablet, Take 1 tablet by mouth 2 (two) times daily as needed for constipation., Disp: ,  Rfl:    tamsulosin (FLOMAX) 0.4 MG CAPS capsule, Take 1 capsule (0.4 mg total) by mouth daily., Disp: 30 capsule, Rfl: 0   topiramate (TOPAMAX) 25 MG tablet, Take 1 tablet (25 mg total) by mouth daily., Disp: 30 tablet, Rfl: 0  Vitals   Vitals:   June 08, 2023 1330 08-Jun-2023 1350 06-08-2023 1400 06-08-23 1500  BP: 135/64  121/67 132/68  Pulse: 79  87 89  Resp: 14  17 12   Temp:  98.3 F (36.8 C)    TempSrc:  Oral    SpO2: 96%  98% 97%  Weight:      Height:        Body mass index is 29.16 kg/m.  Physical Exam   Constitutional: Appears well-developed and well-nourished.   Psych: Affect appropriate to situation.   Eyes: No scleral injection.   HENT: No OP obstruction.   Head: Normocephalic.   Cardiovascular: Normal rate and regular rhythm.   Respiratory: Effort normal, non-labored breathing.   GI: Soft.  No distension. There is no tenderness.   Skin: WDI.    Neurologic Examination   Mental Status -  Level of arousal and orientation to time, place, and person were intact. Language including expression, naming, repetition, comprehension was assessed and found intact. Attention span and concentration were normal. Recent and remote memory were intact. Fund of Knowledge was assessed and was intact.  Cranial Nerves II - XII - II - Visual field intact OU. III, IV, VI - Extraocular movements intact. V - Facial sensation intact bilaterally. VII - Facial movement intact bilaterally. VIII - Hearing & vestibular intact bilaterally. X - Palate elevates symmetrically. XI - Chin turning & shoulder shrug intact bilaterally. XII - Tongue protrusion intact.  Motor Strength - The patient's strength was normal in all extremities and pronator drift was absent.  Left BKA but I think there was some slight hip flexion weakness. Slightly weak left hand grip. Bulk was normal and fasciculations were absent.   Motor Tone - Muscle tone was assessed at the neck and appendages and was normal . Sensory -  decreased on left face, arm and leg Coordination - The patient had normal movements in the hands and feet with no ataxia or dysmetria.  Tremor was absent. Gait and Station - deferred.  Labs/Imaging/Neurodiagnostic studies   CBC:  Recent Labs  Lab 06/08/2023 1001 2023-06-08 1012  WBC 10.0  --   HGB 11.6* 11.6*  HCT 36.8 34.0*  MCV 90.4  --   PLT 511*  --    Basic Metabolic Panel:  Lab Results  Component Value Date   NA 137 06-08-23   K 4.5 2023-06-08   CO2 24 08-Jun-2023   GLUCOSE 127 (H) June 08, 2023   BUN 11 Jun 08, 2023   CREATININE 1.02 (H) 06-08-23   CALCIUM 9.0 08-Jun-2023   GFRNONAA 60 (L) 06-08-23   GFRAA >90 06/25/2014   Lipid Panel:  Lab Results  Component Value Date   LDLCALC 23 08/23/2021  HgbA1c:  Lab Results  Component Value Date   HGBA1C 7.0 (H) 02/01/2023   Urine Drug Screen:     Component Value Date/Time   LABOPIA NONE DETECTED 08/19/2021 2259   COCAINSCRNUR NONE DETECTED 08/19/2021 2259   LABBENZ NONE DETECTED 08/19/2021 2259   AMPHETMU NONE DETECTED 08/19/2021 2259   THCU NONE DETECTED 08/19/2021 2259   LABBARB NONE DETECTED 08/19/2021 2259    Alcohol Level     Component Value Date/Time   ETH <10 06/02/2023 1001   INR  Lab Results  Component Value Date   INR 0.9 06/02/2023   APTT No results found for: "APTT" AED levels: No results found for: "PHENYTOIN", "ZONISAMIDE", "LAMOTRIGINE", "LEVETIRACETA"  CT Head without contrast(Personally reviewed):  No acute process, chronic small vessel disease   MRI Brain(Personally reviewed):   Positive for small Acute lacunar infarct of the central right thalamus.    ASSESSMENT   Anna [name] is a 68 y.o. female  hx of HTN, HLD, DM, prior CVA, anxiety and depression, GERD, COPD, CKD, diabetic retinopathy, diabetic neuropathy, migraines, Left BKA, wheelchair bound who presents to [facility] via EMS after a fall hitting her head. Patient also c/o left face, and left arm numbness and she thinks some  numbness on her left leg. Numbness started on Wednesday almost 2 weeks ago. MRI brain revealed small lacunar infarct in the right thalamus.  RECOMMENDATIONS  - HgbA1c, fasting lipid panel - Frequent neuro checks - Echocardiogram - CTA head and neck - Prophylactic therapy-Antiplatelet med: Aspirin - dose 81mg  and plavix 75mg  daily   - Statin  - Risk factor modification - Telemetry monitoring - PT consult, OT consult, Speech consult - Stroke team to follow ______________________________________________________________________  Gevena Mart DNP, ACNPC-AG  [facility]  I have seen the patient and reviewed the above note.  She has a likely small vessel ischemic stroke and has been having falls at home.  She will need dual antiplatelet therapy for 3 weeks from the initial event.  She will also need physical therapy evaluation and secondary risk factor modification.   Stroke team to follow.  Ritta Slot, MD [facility]   If 7pm- 7am, please page neurology on call as listed in AMION.

## 2023-06-03 ENCOUNTER — Inpatient Hospital Stay (HOSPITAL_COMMUNITY): Payer: Medicare (Managed Care)

## 2023-06-03 ENCOUNTER — Encounter (HOSPITAL_COMMUNITY): Payer: Self-pay | Admitting: Internal Medicine

## 2023-06-03 ENCOUNTER — Other Ambulatory Visit: Payer: Self-pay

## 2023-06-03 DIAGNOSIS — I6389 Other cerebral infarction: Secondary | ICD-10-CM | POA: Diagnosis not present

## 2023-06-03 DIAGNOSIS — W19XXXA Unspecified fall, initial encounter: Secondary | ICD-10-CM | POA: Diagnosis not present

## 2023-06-03 DIAGNOSIS — I639 Cerebral infarction, unspecified: Secondary | ICD-10-CM | POA: Diagnosis not present

## 2023-06-03 DIAGNOSIS — Y92009 Unspecified place in unspecified non-institutional (private) residence as the place of occurrence of the external cause: Secondary | ICD-10-CM | POA: Diagnosis not present

## 2023-06-03 LAB — ECHOCARDIOGRAM COMPLETE BUBBLE STUDY
AR max vel: 2.02 cm2
AV Area VTI: 2 cm2
AV Area mean vel: 1.75 cm2
AV Mean grad: 4 mmHg
AV Peak grad: 8.3 mmHg
Ao pk vel: 1.44 m/s
Area-P 1/2: 3.53 cm2
MV VTI: 1.53 cm2
S' Lateral: 3 cm

## 2023-06-03 LAB — CBG MONITORING, ED
Glucose-Capillary: 119 mg/dL — ABNORMAL HIGH (ref 70–99)
Glucose-Capillary: 99 mg/dL (ref 70–99)

## 2023-06-03 LAB — LIPID PANEL
Cholesterol: 189 mg/dL (ref 0–200)
HDL: 39 mg/dL — ABNORMAL LOW (ref >40)
LDL Cholesterol: 114 mg/dL — ABNORMAL HIGH (ref 0–99)
Total CHOL/HDL Ratio: 4.8 ratio
Triglycerides: 178 mg/dL — ABNORMAL HIGH (ref <150)
VLDL: 36 mg/dL (ref 0–40)

## 2023-06-03 LAB — GLUCOSE, CAPILLARY
Glucose-Capillary: 140 mg/dL — ABNORMAL HIGH (ref 70–99)
Glucose-Capillary: 146 mg/dL — ABNORMAL HIGH (ref 70–99)
Glucose-Capillary: 154 mg/dL — ABNORMAL HIGH (ref 70–99)

## 2023-06-03 LAB — VITAMIN B12: Vitamin B-12: 501 pg/mL (ref 180–914)

## 2023-06-03 MED ORDER — IPRATROPIUM-ALBUTEROL 0.5-2.5 (3) MG/3ML IN SOLN
3.0000 mL | Freq: Four times a day (QID) | RESPIRATORY_TRACT | Status: DC | PRN
  Administered 2023-06-05: 3 mL via RESPIRATORY_TRACT
  Filled 2023-06-03: qty 3

## 2023-06-03 MED ORDER — IPRATROPIUM-ALBUTEROL 0.5-2.5 (3) MG/3ML IN SOLN
3.0000 mL | Freq: Two times a day (BID) | RESPIRATORY_TRACT | Status: DC
  Administered 2023-06-03 – 2023-06-05 (×3): 3 mL via RESPIRATORY_TRACT
  Filled 2023-06-03 (×4): qty 3

## 2023-06-03 MED ORDER — IPRATROPIUM-ALBUTEROL 0.5-2.5 (3) MG/3ML IN SOLN: 3.0000 mL | Freq: Two times a day (BID) | RESPIRATORY_TRACT | Status: DC

## 2023-06-03 MED ORDER — TOPIRAMATE 25 MG PO TABS
50.0000 mg | ORAL_TABLET | Freq: Every day | ORAL | Status: DC
  Administered 2023-06-04 – 2023-06-05 (×2): 50 mg via ORAL
  Filled 2023-06-03 (×2): qty 2

## 2023-06-03 MED ORDER — BENZONATATE 100 MG PO CAPS
200.0000 mg | ORAL_CAPSULE | Freq: Three times a day (TID) | ORAL | Status: DC | PRN
  Administered 2023-06-03: 200 mg via ORAL
  Filled 2023-06-03: qty 2

## 2023-06-03 MED ORDER — PANTOPRAZOLE SODIUM 40 MG PO TBEC
40.0000 mg | DELAYED_RELEASE_TABLET | Freq: Every day | ORAL | Status: DC
  Administered 2023-06-03 – 2023-06-05 (×3): 40 mg via ORAL
  Filled 2023-06-03 (×3): qty 1

## 2023-06-03 MED ORDER — TICAGRELOR 90 MG PO TABS
90.0000 mg | ORAL_TABLET | Freq: Two times a day (BID) | ORAL | Status: DC
  Administered 2023-06-04 – 2023-06-05 (×3): 90 mg via ORAL
  Filled 2023-06-03 (×3): qty 1

## 2023-06-03 MED ORDER — IPRATROPIUM-ALBUTEROL 0.5-2.5 (3) MG/3ML IN SOLN: 3.0000 mL | Freq: Four times a day (QID) | RESPIRATORY_TRACT | Status: DC

## 2023-06-03 MED ORDER — HYDROXYZINE HCL 10 MG PO TABS
10.0000 mg | ORAL_TABLET | Freq: Two times a day (BID) | ORAL | Status: DC | PRN
  Administered 2023-06-03 – 2023-06-05 (×2): 10 mg via ORAL
  Filled 2023-06-03 (×2): qty 1

## 2023-06-03 MED ORDER — ATORVASTATIN CALCIUM 80 MG PO TABS
80.0000 mg | ORAL_TABLET | Freq: Every day | ORAL | Status: DC
  Administered 2023-06-03 – 2023-06-04 (×2): 80 mg via ORAL
  Filled 2023-06-03 (×2): qty 1

## 2023-06-03 NOTE — TOC Initial Note (Signed)
 Transition of Care [facility]) - Initial/Assessment Note    Patient Details  Name: Anna [name] MRN: [id number] Date of Birth: [birth year]  [facility] CM/SW Contact:    Kermit Balo, RN Phone Number: 06/03/2023, 4:07 PM  Clinical Narrative:                  Pt is from home alone. Her oldest son has schizophrenia and has been at her home. He is not supposed to be living with her. Pt states he has been hallucinating and causing her stress and she feels this is what caused her stroke. Youngest son in room states he is wiling to help get the older brother out of the home.  Pt is an amputee. She uses wheelchair at home. She as a prosthesis but is not using it regularly.  Other DME at the home: walker/ shower seat/ grab bars/ BSC/ ramp Pt uses her medicaid transportation services or the bus.  [facility]. [facility] but has been missing with her son at the home and his paranoia. She does have an aide 2 hours a day through [facility] at the home. Current recommendations are for SNF. CM has updated her SW through [facility] of the recommendations and she will update TOC after [facility] meets and decides if they agree with the recommendation. TOC following.  Expected Discharge Plan: Skilled Nursing Facility Barriers to Discharge: Continued Medical Work up   Patient Goals and CMS Choice   CMS Medicare.gov Compare Post Acute Care list provided to:: Patient Choice offered to / list presented to : Patient ([facility])      Expected Discharge Plan and Services In-house Referral: Clinical Social Work Discharge Planning Services: CM Consult Post Acute Care Choice: Skilled Nursing Facility Living arrangements for the past 2 months: Single Family Home                                      Prior Living Arrangements/Services Living arrangements for the past 2 months: Single Family Home Lives with:: Self Patient language and need for interpreter reviewed::  Yes Do you feel safe going back to the place where you live?: Yes          Current home services: DME Criminal Activity/Legal Involvement Pertinent to Current Situation/Hospitalization: No - Comment as needed  Activities of Daily Living      Permission Sought/Granted                  Emotional Assessment Appearance:: Appears stated age Attitude/Demeanor/Rapport: Engaged Affect (typically observed): Accepting Orientation: : Oriented to Self, Oriented to Place, Oriented to  Time, Oriented to Situation   Psych Involvement: No (comment)  Admission diagnosis:  Laceration of left eyebrow, initial encounter [S01.112A] Fall, initial encounter [W19.XXXA] Fall at home, initial encounter [phone].Lorne Skeens, Y92.009] Cerebrovascular accident (CVA), unspecified mechanism (HCC) [I63.9] Patient Active Problem List   Diagnosis Date Noted   Fall at home, initial encounter 06/02/2023   Gangrene of toe of left foot (HCC)    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) 10/06/2021   Dyslipidemia 10/06/2021   Mood disorder (HCC) 10/06/2021   Cellulitis 08/19/2021   Hyperkalemia 08/19/2021   OSA (obstructive sleep apnea) 08/19/2021   Anxiety 08/04/2021   Hyperlipidemia 08/04/2021   Thrombocytosis 08/04/2021   Status post amputation of lesser toe of left foot (HCC) 06/21/2021  PAD (peripheral artery disease) (HCC) 06/12/2021   Acute CVA (cerebrovascular accident) (HCC) 04/11/2021   Type 2 diabetes mellitus with diabetic peripheral angiopathy and gangrene, with long-term current use of insulin (HCC) 04/03/2021   Left sided numbness 03/12/2021   Spinal stenosis of lumbar region with neurogenic claudication 03/18/2019   GERD (gastroesophageal reflux disease) 12/12/2017   History of adenomatous polyp of colon 01/14/2017   Dysphagia 09/12/2016   Screening for colon cancer 09/12/2016   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 [place] [address] Phone: [phone] Fax: [phone]  [facility] - [place], [place] - [address] [place] [address] Phone: [phone] Fax: [phone]  [facility] [address] [place] [address] Phone: [phone] Fax: [phone]  [id number] [place], [place] - [address] AT [facility] [address] [place] [address] Phone: [phone] Fax: [phone]     Social Drivers of Health (SDOH) Social History: SDOH Screenings   Food Insecurity: No Food Insecurity (09/30/2022)  Housing: Low Risk  (09/30/2022)  Transportation Needs: No Transportation Needs (09/30/2022)  Utilities: Not At Risk (09/30/2022)  Social Connections: Unknown (07/28/2021)   [facility] from [facility]  Tobacco Use: Low Risk  (06/02/2023)   SDOH Interventions:     Readmission Risk Interventions     No data to display

## 2023-06-03 NOTE — Progress Notes (Addendum)
 STROKE TEAM PROGRESS NOTE  She presented with 2-week history of sudden onset of left lower face and arm paresthesias but did not seek medical help until yesterday when she had a fall and came to the ER and mentioned this.  MRI shows small subacute right thalamic lacunar infarct.  CT angiogram showed moderate left P2 and right vertebral artery stenosis.  INTERIM HISTORY/SUBJECTIVE  Patient has been hemodynamically stable and afebrile.  She reports that her left-sided numbness continues.  Will increase Topamax which she takes for migraine prevention in an effort to relieve this.  OBJECTIVE  CBC    Component Value Date/Time   WBC 10.0 06/02/2023 1001   RBC 4.07 06/02/2023 1001   HGB 11.6 (L) 06/02/2023 1012   HCT 34.0 (L) 06/02/2023 1012   PLT 511 (H) 06/02/2023 1001   MCV 90.4 06/02/2023 1001   MCH 28.5 06/02/2023 1001   MCHC 31.5 06/02/2023 1001   RDW 13.2 06/02/2023 1001   LYMPHSABS 1.4 10/03/2022 0203   MONOABS 0.9 10/03/2022 0203   EOSABS 0.3 10/03/2022 0203   BASOSABS 0.0 10/03/2022 0203    BMET    Component Value Date/Time   NA 137 06/02/2023 1350   K 4.5 06/02/2023 1350   CL 106 06/02/2023 1350   CO2 24 06/02/2023 1350   GLUCOSE 127 (H) 06/02/2023 1350   BUN 11 06/02/2023 1350   CREATININE 1.02 (H) 06/02/2023 1350   CALCIUM 9.0 06/02/2023 1350   GFRNONAA 60 (L) 06/02/2023 1350    IMAGING past 24 hours ECHOCARDIOGRAM COMPLETE BUBBLE STUDY Result Date: 06/03/2023    ECHOCARDIOGRAM REPORT   Patient Name:   Anna [name] Date of Exam: 06/03/2023 Medical Rec #:  [id number]        Height:       61.0 in Accession #:    [id number]       Weight:       154.3 lb Date of Birth:  1955/06/01       BSA:          1.692 m Patient Age:    67 years         BP:           133/70 mmHg Patient Gender: F                HR:           85 bpm. Exam Location:  Inpatient Procedure: 2D Echo, Cardiac Doppler, Color Doppler and Saline Contrast Bubble            Study (Both Spectral and Color Flow  Doppler were utilized during            procedure). Indications:    Stroke  History:        Patient has prior history of Echocardiogram examinations, most                 recent 08/25/2021. Risk Factors:Hypertension, Diabetes,                 Dyslipidemia and Sleep Apnea.  Sonographer:    Karma Ganja Referring Phys: [phone] EKTA V PATEL  Sonographer Comments: Technically challenging study due to limited acoustic windows. Image acquisition challenging due to patient body habitus. IMPRESSIONS  1. Left ventricular ejection fraction, by estimation, is 60 to 65%. The left ventricle has normal function. The left ventricle has no regional wall motion abnormalities. Left ventricular diastolic parameters were normal.  2. Right ventricular systolic function is normal. The right ventricular  size is normal.  3. The mitral valve is normal in structure. Trivial mitral valve regurgitation. No evidence of mitral stenosis.  4. The aortic valve has an indeterminant number of cusps. Aortic valve regurgitation is not visualized. No aortic stenosis is present.  5. The inferior vena cava is normal in size with greater than 50% respiratory variability, suggesting right atrial pressure of 3 mmHg.  6. Agitated saline contrast bubble study was negative, with no evidence of any interatrial shunt. FINDINGS  Left Ventricle: Left ventricular ejection fraction, by estimation, is 60 to 65%. The left ventricle has normal function. The left ventricle has no regional wall motion abnormalities. The left ventricular internal cavity size was normal in size. There is  no left ventricular hypertrophy. Left ventricular diastolic parameters were normal. Right Ventricle: The right ventricular size is normal. No increase in right ventricular wall thickness. Right ventricular systolic function is normal. Left Atrium: Left atrial size was normal in size. Right Atrium: Right atrial size was normal in size. Pericardium: There is no evidence of pericardial effusion.  Mitral Valve: The mitral valve is normal in structure. Trivial mitral valve regurgitation. No evidence of mitral valve stenosis. MV peak gradient, 9.5 mmHg. The mean mitral valve gradient is 4.0 mmHg. Tricuspid Valve: The tricuspid valve is normal in structure. Tricuspid valve regurgitation is not demonstrated. No evidence of tricuspid stenosis. Aortic Valve: The aortic valve has an indeterminant number of cusps. Aortic valve regurgitation is not visualized. No aortic stenosis is present. Aortic valve mean gradient measures 4.0 mmHg. Aortic valve peak gradient measures 8.3 mmHg. Aortic valve area, by VTI measures 2.00 cm. Pulmonic Valve: The pulmonic valve was normal in structure. Pulmonic valve regurgitation is not visualized. No evidence of pulmonic stenosis. Aorta: The aortic root is normal in size and structure. Venous: The inferior vena cava is normal in size with greater than 50% respiratory variability, suggesting right atrial pressure of 3 mmHg. IAS/Shunts: No atrial level shunt detected by color flow Doppler. Agitated saline contrast was given intravenously to evaluate for intracardiac shunting. Agitated saline contrast bubble study was negative, with no evidence of any interatrial shunt.  LEFT VENTRICLE PLAX 2D LVIDd:         4.40 cm   Diastology LVIDs:         3.00 cm   LV e' medial:    7.93 cm/s LV PW:         0.80 cm   LV E/e' medial:  13.1 LV IVS:        0.80 cm   LV e' lateral:   12.90 cm/s LVOT diam:     2.00 cm   LV E/e' lateral: 8.1 LV SV:         55 LV SV Index:   32 LVOT Area:     3.14 cm  RIGHT VENTRICLE             IVC RV Basal diam:  3.10 cm     IVC diam: 1.20 cm RV S prime:     11.20 cm/s TAPSE (M-mode): 1.7 cm LEFT ATRIUM             Index        RIGHT ATRIUM           Index LA diam:        3.00 cm 1.77 cm/m   RA Area:     10.90 cm LA Vol (A2C):   46.6 ml 27.55 ml/m  RA Volume:   20.30 ml  12.00 ml/m LA Vol (A4C):   37.2 ml 21.99 ml/m LA Biplane Vol: 42.4 ml 25.06 ml/m  AORTIC VALVE  AV Area (Vmax):    2.02 cm AV Area (Vmean):   1.75 cm AV Area (VTI):     2.00 cm AV Vmax:           144.00 cm/s AV Vmean:          95.000 cm/s AV VTI:            0.273 m AV Peak Grad:      8.3 mmHg AV Mean Grad:      4.0 mmHg LVOT Vmax:         92.50 cm/s LVOT Vmean:        52.900 cm/s LVOT VTI:          0.174 m LVOT/AV VTI ratio: 0.64  AORTA Ao Root diam: 2.70 cm MITRAL VALVE MV Area (PHT): 3.53 cm     SHUNTS MV Area VTI:   1.53 cm     Systemic VTI:  0.17 m MV Peak grad:  9.5 mmHg     Systemic Diam: 2.00 cm MV Mean grad:  4.0 mmHg MV Vmax:       1.54 m/s MV Vmean:      98.5 cm/s MV Decel Time: 215 msec MV E velocity: 104.00 cm/s MV A velocity: 119.00 cm/s MV E/A ratio:  0.87 Aditya Sabharwal Electronically signed by Dorthula Nettles Signature Date/Time: 06/03/2023/11:14:02 AM    Final    CT ANGIO HEAD NECK W WO CM Result Date: 06/02/2023 CLINICAL DATA:  Stroke/TIA, determine embolic source EXAM: CT ANGIOGRAPHY HEAD AND NECK WITH AND WITHOUT CONTRAST TECHNIQUE: Multidetector CT imaging of the head and neck was performed using the standard protocol during bolus administration of intravenous contrast. Multiplanar CT image reconstructions and MIPs were obtained to evaluate the vascular anatomy. Carotid stenosis measurements (when applicable) are obtained utilizing NASCET criteria, using the distal internal carotid diameter as the denominator. RADIATION DOSE REDUCTION: This exam was performed according to the departmental dose-optimization program which includes automated exposure control, adjustment of the mA and/or kV according to patient size and/or use of iterative reconstruction technique. CONTRAST:  75mL OMNIPAQUE IOHEXOL 350 MG/ML SOLN COMPARISON:  CT head March 9, 25. FINDINGS: CTA NECK FINDINGS Aortic arch: Aortic atherosclerosis. Great vessel origins are patent without significant stenosis. Right carotid system: Atherosclerosis at the carotid bifurcation without greater than 50% stenosis. Left carotid  system: Atherosclerosis at the carotid bifurcation without greater than 50% stenosis. Vertebral arteries: Moderate stenosis of the right vertebral artery at its origin and at C4 and C5 due to mass effect from adjacent osteophytes. Left vertebral artery is patent with severe origin stenosis and severe foraminal stenosis at C5-C6 due to mass effect from osteophytes. Skeleton: Severe multilevel degenerative change including posterior disc osteophyte complexes and facet/uncovertebral hypertrophy resulting in multilevel foraminal stenosis. Other neck: No acute abnormality on limited assessment. Upper chest: Visualized lung apices are clear. Review of the MIP images confirms the above findings CTA HEAD FINDINGS Anterior circulation: Bilateral intracranial ICAs, MCAs, and ACAs are patent without proximal hemodynamically significant stenosis. Posterior circulation: Bilateral intradural vertebral arteries are patent. Severe proximal left intradural vertebral artery stenosis. Basilar artery and bilateral posterior cerebral arteries are patent. 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 Venous sinuses: As permitted by contrast timing, patent. Review of the MIP images confirms the above findings IMPRESSION: 1. No emergent large vessel occlusion. 2. Severe right and moderate left P2  PCA stenosis. 3. 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 4.  Aortic Atherosclerosis (ICD10-I70.0). Electronically Signed   By: Feliberto Harts M.D.   On: 06/02/2023 20:01    Vitals:   06/03/23 0144 06/03/23 0500 06/03/23 0534 06/03/23 0754  BP: 139/81 (!) 144/73  133/70  Pulse: 77 75  80  Resp: 13 15  18   Temp: 98.3 F (36.8 C)  98 F (36.7 C) 98.3 F (36.8 C)  TempSrc: Oral   Oral  SpO2: 100% 100%  100%  Weight:      Height:         PHYSICAL EXAM General:  Alert, well-nourished, well-developed pleasant middle-age Caucasian lady in no acute distress Psych:  Mood and affect  appropriate for situation CV: Regular rate and rhythm on monitor Respiratory:  Regular, unlabored respirations on room air Left lower extremity below-knee amputation  NEURO:  Mental Status: AA&Ox3, patient is able to give clear and coherent history Speech/Language: speech is without dysarthria or aphasia.    Cranial Nerves:  II: PERRL.  III, IV, VI: EOMI. Eyelids elevate symmetrically.  V: Sensation is intact to light touch and diminished in left V2 and V3 VII: Face is symmetrical resting and smiling VIII: hearing intact to voice. IX, X:Phonation is normal.  ZO:[id number] shrug 5/5. XII: tongue is midline without fasciculations. Motor: 5/5 strength to all muscle groups tested.  Right arm orbits left Tone: is normal and bulk is normal Sensation- Intact to light touch bilaterally but diminished and left upper extremity Coordination: FTN intact bilaterally Gait- deferred  Most Recent NIH  1a Level of Conscious.: 0 1b LOC Questions: 0 1c LOC Commands: 0 2 Best Gaze: 0 3 Visual: 0 4 Facial Palsy: 0 5a Motor Arm - left: 0 5b Motor Arm - Right: 0 6a Motor Leg - Left: 0 6b Motor Leg - Right: 0 7 Limb Ataxia: 0 8 Sensory: 1 9 Best Language: 0 10 Dysarthria: 0 11 Extinct. and Inatten.: 0 TOTAL: 1   ASSESSMENT/PLAN  Anna [name] is a 68 y.o. female with history of hypertension, hyperlipidemia, diabetes, stroke, anxiety, depression, GERD, COPD, CKD, sleep apnea, diabetic retinopathy, diabetic neuropathy, migraines and left BKA admitted after a fall from her wheelchair in which she hit her head.  Patient also stated on admission that she had left face and arm numbness which began about 2 weeks ago.  She was found to have a small lacunar infarct in the right thalamus on MRI.  NIH on Admission 1  SubAcute Ischemic Infarct:  right thalamic stroke Etiology: Small vessel disease CT head No acute abnormality.   CTA head & neck no emergent LVO, severe right and moderate left P2  PCA stenosis, multifocal severe left and moderate right vertebral artery stenosis MRI small acute right thalamic infarct, chronic small vessel disease 2D Echo EF 60 to 65%, no interatrial shunt, normal left atrial size LDL 114 HgbA1c 7.5 VTE prophylaxis -subcutaneous heparin aspirin 81 mg daily and clopidogrel 75 mg daily prior to admission, now on aspirin 81 mg daily and Brilinta (ticagrelor) 90 mg bid for 4 weeks and then aspirin and Plavix Therapy recommendations:  Pending Disposition: Pending  Hx of Stroke/TIA Patient has history of right thalamic stroke in 12/22  Hypertension Home meds: Lisinopril 5 mg daily Stable Maintain normotension, outside of permissive hypertension window  Hyperlipidemia Home meds: Atorvastatin 40 mg daily, increased to 80 LDL 114, goal < 70 Continue statin at discharge  Diabetes type II Uncontrolled  Home meds: Metformin 1000 mg twice daily, insulin glargine 25 units daily, NovoLog insulin sliding scale HgbA1c 7.5, goal < 7.0 CBGs SSI Recommend close follow-up with PCP for better DM control  Other Stroke Risk Factors Migraines   Other Active Problems None  Hospital day # 1  Patient seen by NP with MD, MD to edit note as needed. Anna [name] , MSN, AGACNP-BC [facility] See Amion for schedule and pager information 06/03/2023 1:15 PM   I have personally obtained history,examined this patient, reviewed notes, independently viewed imaging studies, participated in medical decision making and plan of care.ROS completed by me personally and pertinent positives fully documented  I have made any additions or clarifications directly to the above note. Agree with note above.  She presented with sudden onset of left face and arm paresthesias about 2 weeks ago but did not seek medical help for this until yesterday when she had a fall and left periorbital contusion.  MRI showed subacute small right thalamic infarct as well as evidence of  prior lacunar strokes.  Recommend aspirin and Brilinta for 4 weeks followed by aspirin alone as patient is already on aspirin and Plavix likely for peripheral vascular disease.  Aggressive risk factor modification.  Mobilize out of bed.  Therapy consults.  Increase Topamax to 50 mg twice daily to help with poststroke paresthesias.  Discussed with Dr. Sunnie Nielsen.  Greater than 50% time during this 50-minute visit was spent in counseling and coordination of care and discussion patient care team and answering questions.  Follow-up as an outpatient stroke clinic in 2 months  Delia Heady, MD Medical Director [facility] Pager: [phone] 06/03/2023 3:01 PM   To contact Stroke Continuity provider, please refer to [email or web address]. After hours, contact General Neurology

## 2023-06-03 NOTE — Progress Notes (Signed)
 Transition of Care [facility]) - CAGE-AID Screening   Patient Details  Name: Anna [name] MRN: [id number] Date of Birth: 12-09-1955  Transition of Care [facility]) CM/SW Contact:    Leota Sauers, RN Phone Number: 06/03/2023, 10:13 PM   Clinical Narrative:  Patient denies the use of alcohol and illicit substances. Resources not given at this time.  CAGE-AID Screening:    Have You Ever Felt You Ought to Cut Down on Your Drinking or Drug Use?: No Have People Annoyed You By Critizing Your Drinking Or Drug Use?: No Have You Felt Bad Or Guilty About Your Drinking Or Drug Use?: No Have You Ever Had a Drink or Used Drugs First Thing In The Morning to Steady Your Nerves or to Get Rid of a Hangover?: No CAGE-AID Score: 0  Substance Abuse Education Offered: No

## 2023-06-03 NOTE — Evaluation (Signed)
 Physical Therapy Evaluation Patient Details Name: Anna [name] MRN: [id number] DOB: [birth year] Today's Date: 06/03/2023  History of Present Illness  Patient is 68 y.o. female who presents to [facility] via EMS after a fall hitting her head while attempted to transfer couch to [facility]. Pt also reports history of 2 weeks of numbness in Lt face, UE, and LE. MRI revealed small lacunar infarct in the right thalamus. PMH significant for HTN, HLD, DM, prior CVA, anxiety and depression, GERD, COPD, CKD, diabetic retinopathy, diabetic neuropathy, migraines, Left BKA, WC user at baseline.   Clinical Impression  Anna [name] is 68 y.o. female admitted with above HPI and diagnosis. Patient is currently limited by functional impairments below (see PT problem list). Patient lives alone and is mod ind with WC for mobility at baseline and previously had been working with RW for household ambulation with Lt prosthesis in the fall of 2024. Currently pt requires min assist/CGA for sit<>stand with RW and lateral scoot transfers bed<>chair. Pt reports ongoing numbness in Lt face and UE, denies in Lt LE. Patient will benefit from continued skilled PT interventions to address impairments and progress independence with mobility. Patient will benefit from continued inpatient follow up therapy, <3 hours/day. Acute PT will follow and progress as able.         If plan is discharge home, recommend the following: A lot of help with walking and/or transfers;A lot of help with bathing/dressing/bathroom;Assistance with cooking/housework;Assist for transportation;Help with stairs or ramp for entrance   Can travel by private vehicle   Yes    Equipment Recommendations None recommended by PT (defer to next venue)  Recommendations for Other Services       Functional Status Assessment Patient has had a recent decline in their functional status and demonstrates the ability to make significant improvements in function in a reasonable  and predictable amount of time.     Precautions / Restrictions Precautions Precautions: Fall Recall of Precautions/Restrictions: Intact Restrictions Weight Bearing Restrictions Per Provider Order: No      Mobility  Bed Mobility Overal bed mobility: Needs Assistance Bed Mobility: Supine to Sit     Supine to sit: Supervision, Used rails, HOB elevated     General bed mobility comments: HOB slightly elevated, use of bed features to sit up.    Transfers Overall transfer level: Needs assistance Equipment used: None, Rolling walker (2 wheels) Transfers: Bed to chair/wheelchair/BSC, Sit to/from Stand Sit to Stand: Min assist          Lateral/Scoot Transfers: Contact guard assist General transfer comment: close CGA for safety, cues for lateral scoot vs partial stand and pivot for safety. pt completed bed>chair with multiple small scoots. RW provided for sit<>stand from recliner for linen change, min assist to power up and steady with hand transition to RW.    Ambulation/Gait             Pre-gait activities: able to use RW for stand and single hop forward/backward with min assist.    Stairs            Wheelchair Mobility     Tilt Bed    Modified Rankin (Stroke Patients Only)       Balance Overall balance assessment: Needs assistance Sitting-balance support: Feet supported Sitting balance-Leahy Scale: Good     Standing balance support: Reliant on assistive device for balance, During functional activity, Bilateral upper extremity supported Standing balance-Leahy Scale: Poor Standing balance comment: reliant on external support and device  Pertinent Vitals/Pain      Home Living Family/patient expects to be discharged to:: Private residence Living Arrangements: Alone Available Help at Discharge: Family Type of Home: House Home Access: Ramped entrance (small threshold/lip at door (has to bump WC over it))        Home Layout: One level Home Equipment: Tub bench;Grab bars - toilet;Rolling Walker (2 wheels);BSC/3in1;Wheelchair - manual;Hospital bed      Prior Function Prior Level of Function : Independent/Modified Independent             Mobility Comments: Pt reports she was able to perform bed mobility mod I with hospital bed, stand pivot or squat pivot transfers & to [facility]. prior to fall 2024 admission pt was ambulating up to 100 ft with RW & prosthetic but has not been ambulating since and uses w/c mobility at mod Ind level. ADLs Comments: Reports modified independence with ADL/IADLs, wears briefs for incontinence.     Extremity/Trunk Assessment   Upper Extremity Assessment Upper Extremity Assessment: Defer to OT evaluation    Lower Extremity Assessment Lower Extremity Assessment: Overall WFL for tasks assessed;LLE deficits/detail;RLE deficits/detail RLE Deficits / Details: Rt LE WFL 4/5 grossly RLE Sensation: WNL RLE Coordination: WNL LLE Deficits / Details: reports sensation intact, proximal hip weakness, may be related to BKA and decreased ambulation vs new/subacute CVA. LLE Sensation: WNL LLE Coordination: WNL    Cervical / Trunk Assessment Cervical / Trunk Assessment: Normal  Communication   Communication Communication: No apparent difficulties    Cognition Arousal: Alert Behavior During Therapy: WFL for tasks assessed/performed   PT - Cognitive impairments: No apparent impairments                         Following commands: Intact       Cueing Cueing Techniques: Verbal cues     General Comments      Exercises     Assessment/Plan    PT Assessment Patient needs continued PT services  PT Problem List Decreased strength;Decreased activity tolerance;Decreased balance;Decreased mobility;Decreased knowledge of use of DME;Decreased safety awareness;Decreased knowledge of precautions;Obesity;Impaired sensation       PT Treatment Interventions DME  instruction;Gait training;Stair training;Functional mobility training;Therapeutic activities;Therapeutic exercise;Balance training;Neuromuscular re-education;Patient/family education;Manual techniques;Wheelchair mobility training    PT Goals (Current goals can be found in the Care Plan section)  Acute Rehab PT Goals Patient Stated Goal: get stronger and independent and back home PT Goal Formulation: With patient Time For Goal Achievement: 06/17/23 Potential to Achieve Goals: Good    Frequency Min 1X/week     Co-evaluation               AM-PAC PT "6 Clicks" Mobility  Outcome Measure Help needed turning from your back to your side while in a flat bed without using bedrails?: A Little Help needed moving from lying on your back to sitting on the side of a flat bed without using bedrails?: A Little Help needed moving to and from a bed to a chair (including a wheelchair)?: A Little Help needed standing up from a chair using your arms (e.g., wheelchair or bedside chair)?: A Little Help needed to walk in hospital room?: A Lot Help needed climbing 3-5 steps with a railing? : Total 6 Click Score: 15    End of Session Equipment Utilized During Treatment: Gait belt Activity Tolerance: Patient tolerated treatment well Patient left: in chair;with call bell/phone within reach;with chair alarm set;with family/visitor present Nurse Communication: Mobility status PT Visit  Diagnosis: Other abnormalities of gait and mobility (R26.89);Muscle weakness (generalized) (M62.81);Difficulty in walking, not elsewhere classified (R26.2);Other symptoms and signs involving the nervous system (R29.898)    Time: 1240-1316 PT Time Calculation (min) (ACUTE ONLY): 36 min   Charges:   PT Evaluation $PT Eval Moderate Complexity: 1 Mod PT Treatments $Therapeutic Activity: 8-22 mins PT General Charges $$ ACUTE PT VISIT: 1 Visit         [name] Maudlin, DPT Acute Rehabilitation Services Office  [phone]  06/03/23 2:22 PM

## 2023-06-03 NOTE — Progress Notes (Signed)
  Echocardiogram 2D Echocardiogram has been performed.  Anna [name] 06/03/2023, 11:08 AM

## 2023-06-03 NOTE — Evaluation (Signed)
 Occupational Therapy Evaluation Patient Details Name: Anna [name] MRN: [id number] DOB: 06/05/1955 Today's Date: 06/03/2023   History of Present Illness   Patient is 68 y.o. female who presents to [facility] via EMS after a fall hitting her head while attempted to transfer couch to [facility]. Pt also reports history of 2 weeks of numbness in Lt face, UE, and LE. MRI revealed small lacunar infarct in the right thalamus. PMH significant for HTN, HLD, DM, prior CVA, anxiety and depression, GERD, COPD, CKD, diabetic retinopathy, diabetic neuropathy, migraines, Left BKA, WC user at baseline.     Clinical Impressions Patient admitted for the diagnosis above.  PTA she lives alone, with very little support from her son.  Patient typically transfers to/from surfaces with Mod I and needed no assist for ADL and iADL.  Currently she is limited by the deficits listed below.  Needing up to CGA/Min A for lower body ADL and basic transfers.  OT will follow in the acute setting to address deficits, and Patient will benefit from continued inpatient follow up therapy, <3 hours/day.     If plan is discharge home, recommend the following:   Assist for transportation;A little help with walking and/or transfers;A little help with bathing/dressing/bathroom     Functional Status Assessment   Patient has had a recent decline in their functional status and demonstrates the ability to make significant improvements in function in a reasonable and predictable amount of time.     Equipment Recommendations   None recommended by OT     Recommendations for Other Services         Precautions/Restrictions   Precautions Precautions: Fall Recall of Precautions/Restrictions: Intact Restrictions Weight Bearing Restrictions Per Provider Order: No     Mobility Bed Mobility Overal bed mobility: Needs Assistance Bed Mobility: Sit to Supine       Sit to supine: Supervision        Transfers Overall transfer  level: Needs assistance Equipment used: None Transfers: Bed to chair/wheelchair/BSC     Squat pivot transfers: Contact guard assist      Lateral/Scoot Transfers: Contact guard assist        Balance Overall balance assessment: Needs assistance Sitting-balance support: Feet supported Sitting balance-Leahy Scale: Good     Standing balance support: Bilateral upper extremity supported Standing balance-Leahy Scale: Poor                             ADL either performed or assessed with clinical judgement   ADL Overall ADL's : Needs assistance/impaired Eating/Feeding: Independent;Sitting   Grooming: Wash/dry hands;Wash/dry face;Set up;Sitting   Upper Body Bathing: Set up;Sitting   Lower Body Bathing: Contact guard assist;Sitting/lateral leans;Sit to/from stand   Upper Body Dressing : Set up;Sitting   Lower Body Dressing: Contact guard assist;Sit to/from stand;Sitting/lateral leans   Toilet Transfer: Contact guard assist;BSC/3in1;Stand-pivot;Squat-pivot   Toileting- Clothing Manipulation and Hygiene: Contact guard assist;Sit to/from stand               Vision Baseline Vision/History: 1 Wears glasses Patient Visual Report: No change from baseline       Perception Perception: Within Functional Limits       Praxis Praxis: WFL       Pertinent Vitals/Pain Pain Assessment Pain Assessment: No/denies pain     Extremity/Trunk Assessment Upper Extremity Assessment Upper Extremity Assessment: Overall WFL for tasks assessed   Lower Extremity Assessment Lower Extremity Assessment: Defer to PT evaluation RLE Deficits /  Details: Rt LE WFL 4/5 grossly RLE Sensation: WNL RLE Coordination: WNL LLE Deficits / Details: reports sensation intact, proximal hip weakness, may be related to BKA and decreased ambulation vs new/subacute CVA. LLE Sensation: WNL LLE Coordination: WNL   Cervical / Trunk Assessment Cervical / Trunk Assessment: Normal   Communication  Communication Communication: No apparent difficulties   Cognition Arousal: Alert Behavior During Therapy: WFL for tasks assessed/performed Cognition: No apparent impairments                               Following commands: Intact       Cueing  General Comments   Cueing Techniques: Verbal cues   VSS on RA   Exercises     Shoulder Instructions      Home Living Family/patient expects to be discharged to:: Private residence Living Arrangements: Alone Available Help at Discharge: Family Type of Home: House Home Access: Ramped entrance     Home Layout: One level     Bathroom Shower/Tub: Chief Strategy Officer: Standard Bathroom Accessibility: Yes   Home Equipment: Tub bench;Grab bars - toilet;Rolling Walker (2 wheels);BSC/3in1;Wheelchair - manual;Hospital bed          Prior Functioning/Environment Prior Level of Function : Independent/Modified Independent             Mobility Comments: Pt reports she was able to perform bed mobility mod I with hospital bed, stand pivot or squat pivot transfers & to [facility]. prior to fall 2024 admission pt was ambulating up to 100 ft with RW & prosthetic but has not been ambulating since and uses w/c mobility at mod Ind level. ADLs Comments: Reports modified independence with ADL/IADLs, wears briefs for incontinence.    OT Problem List: Decreased strength;Decreased activity tolerance;Impaired balance (sitting and/or standing)   OT Treatment/Interventions: Self-care/ADL training;Therapeutic activities;Patient/family education;DME and/or AE instruction;Balance training      OT Goals(Current goals can be found in the care plan section)   Acute Rehab OT Goals Patient Stated Goal: Return home OT Goal Formulation: With patient/family Time For Goal Achievement: 06/17/23 Potential to Achieve Goals: Good ADL Goals Pt Will Perform Grooming: with modified independence Pt Will Perform Lower Body Dressing: with  modified independence;sitting/lateral leans;sit to/from stand Pt Will Transfer to Toilet: with modified independence;stand pivot transfer;squat pivot transfer;bedside commode;regular height toilet   OT Frequency:  Min 1X/week    Co-evaluation              AM-PAC OT "6 Clicks" Daily Activity     Outcome Measure Help from another person eating meals?: None Help from another person taking care of personal grooming?: None Help from another person toileting, which includes using toliet, bedpan, or urinal?: A Little Help from another person bathing (including washing, rinsing, drying)?: A Little Help from another person to put on and taking off regular upper body clothing?: None Help from another person to put on and taking off regular lower body clothing?: A Little 6 Click Score: 21   End of Session Nurse Communication: Mobility status  Activity Tolerance: Patient tolerated treatment well Patient left: in bed;with call bell/phone within reach;with nursing/sitter in room  OT Visit Diagnosis: Unsteadiness on feet (R26.81)                Time: 1610-9604 OT Time Calculation (min): 21 min Charges:  OT General Charges $OT Visit: 1 Visit OT Evaluation $OT Eval Moderate Complexity: 1 Mod  06/03/2023  RP, OTR/L  Acute Rehabilitation Services  Office:  [phone]   Suzanna Obey 06/03/2023, 3:01 PM

## 2023-06-03 NOTE — Progress Notes (Signed)
 PROGRESS NOTE    Anna [name]  HQI:[id number] DOB: 01-25-1956 DOA: 06/02/2023 PCP: [facility]   Brief Narrative: 68 year old with past medical history significant for CVA on Plavix, diabetes, hypertension, left leg BKA, COPD, allergies to more than 14 medications, lives at home with, patient presents after a fall, she was trying to get up to transfer herself from the couch to the wheelchair, today she lost balance, hit her face.  Few days ago she had some diarrhea and also noticed that her face and left arm were numb a week ago.  During evaluation, MRI was positive for a small acute lacunar infarct of the central right thalamus.  Neurology was consulted.  Patient was admitted for further evaluation.  Assessment & Plan:   Principal Problem:   Fall at home, initial encounter Active Problems:   Acute CVA (cerebrovascular accident) [facility])   Essential hypertension   Hyperkalemia   Type 2 diabetes mellitus (HCC)   OSA (obstructive sleep apnea)   GERD (gastroesophageal reflux disease)   1-Acute CVA: -Patient presented with numbness and tingling of the left side of her face that is started a week ago.  Also presented after a fall -MRI for acute right thalamic infarct -Started on aspirin and Plavix by neurology -LDL 114, triglycerides 178 -CT angio neck and head:No emergent large vessel occlusion.  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 Multifocal severe left and moderate right vertebral artery stenosis. -A1c: 7.5 -ECHO: Ef 60% Started on aspirin and Brilinta for stroke prevention per neurology recommendation.  Therapy recommendation pending  Neurology increase Topamax to help with numbness  Fall; -PT/ OT consulted -CT head, Cervical spine: No acute intracranial abnormality. Mild chronic small vessel disease. No evidence of cervical spine fracture or subluxation. Moderate degenerative disc disease at C5-6 and C6-7. Moderate left-sided facet  DJD at C2-3 and C3-4. -Pelvis x-ray: No acute abnormality  Essential hypertension: -Permissive  hypertension in the setting of acute stroke. -Hold lisinopril  Diabetes type 2: -A1c 7.5 SSI Hyperkalemia: -Resolved  CKDIIIa: Creatinine baseline 0.8-1.1 -Presents with a creatinine of 1.1, to baseline -AKI has been rule out  Anemia: -Monitor hemoglobin outpatient follow-up  Cough: start tessalon  . Duoneb BID Chest x ray : Minimal left base atelectasis. Otherwise no acute cardiopulmonary findings.  Estimated body mass index is 29.16 kg/m as calculated from the following:   Height as of this encounter: 5\' 1"  (1.549 m).   Weight as of this encounter: 70 kg.   DVT prophylaxis: heparin  Code Status: full code Family Communication: Care discussed with patient.  Disposition Plan:  Status is: Inpatient Remains inpatient appropriate because: for stroke, awaiting final neurology recommendation and PT eval    Consultants:  Neurology   Procedures:  ECHO  Antimicrobials:    Subjective: She report cough, persist. She uses nebulizer at home Left side numbness persist. No weakness.  She lives alone.   Objective: Vitals:   06/02/23 2131 06/03/23 0144 06/03/23 0500 06/03/23 0534  BP:  139/81 (!) 144/73   Pulse:  77 75   Resp:  13 15   Temp: 98.7 F (37.1 C) 98.3 F (36.8 C)  98 F (36.7 C)  TempSrc: Oral Oral    SpO2:  100% 100%   Weight:      Height:       No intake or output data in the 24 hours ending 06/03/23 0706 Filed Weights   06/02/23 0954  Weight: 70 kg  Examination:  General exam: Appears calm and comfortable  Respiratory system: Clear to auscultation. Respiratory effort normal. Cardiovascular system: S1 & S2 heard, RRR. No JVD, murmurs, rubs, gallops or clicks. No pedal edema. Gastrointestinal system: Abdomen is nondistended, soft and nontender. No organomegaly or masses felt. Normal bowel sounds heard. Central nervous system: Alert and  oriented. Moves extremities Extremities: moves extremities  Data Reviewed: I have personally reviewed following labs and imaging studies  CBC: Recent Labs  Lab 06/02/23 1001 06/02/23 1012  WBC 10.0  --   HGB 11.6* 11.6*  HCT 36.8 34.0*  MCV 90.4  --   PLT 511*  --    Basic Metabolic Panel: Recent Labs  Lab 06/02/23 1012 06/02/23 1350  NA 136 137  K 5.8* 4.5  CL 108 106  CO2  --  24  GLUCOSE 129* 127*  BUN 13 11  CREATININE 1.10* 1.02*  CALCIUM  --  9.0   GFR: Estimated Creatinine Clearance: 47.3 mL/min (A) (by C-G formula based on SCr of 1.02 mg/dL (H)). Liver Function Tests: Recent Labs  Lab 06/02/23 1350  AST 18  ALT 11  ALKPHOS 47  BILITOT 0.4  PROT 6.5  ALBUMIN 3.1*   No results for input(s): "LIPASE", "AMYLASE" in the last 168 hours. No results for input(s): "AMMONIA" in the last 168 hours. Coagulation Profile: Recent Labs  Lab 06/02/23 1001  INR 0.9   Cardiac Enzymes: No results for input(s): "CKTOTAL", "CKMB", "CKMBINDEX", "TROPONINI" in the last 168 hours. BNP (last 3 results) No results for input(s): "PROBNP" in the last 8760 hours. HbA1C: Recent Labs    06/02/23 1001  HGBA1C 7.5*   CBG: Recent Labs  Lab 06/02/23 2002 06/03/23 0143 06/03/23 0410  GLUCAP 112* 119* 99   Lipid Profile: Recent Labs    06/03/23 0527  CHOL 189  HDL 39*  LDLCALC 114*  TRIG 178*  CHOLHDL 4.8   Thyroid Function Tests: No results for input(s): "TSH", "T4TOTAL", "FREET4", "T3FREE", "THYROIDAB" in the last 72 hours. Anemia Panel: No results for input(s): "VITAMINB12", "FOLATE", "FERRITIN", "TIBC", "IRON", "RETICCTPCT" in the last 72 hours. Sepsis Labs: Recent Labs  Lab 06/02/23 1012 06/02/23 1357  LATICACIDVEN 2.0* 1.1    No results found for this or any previous visit (from the past 240 hours).       Radiology Studies: CT ANGIO HEAD NECK W WO CM Result Date: 06/02/2023 CLINICAL DATA:  Stroke/TIA, determine embolic source EXAM: CT  ANGIOGRAPHY HEAD AND NECK WITH AND WITHOUT CONTRAST TECHNIQUE: Multidetector CT imaging of the head and neck was performed using the standard protocol during bolus administration of intravenous contrast. Multiplanar CT image reconstructions and MIPs were obtained to evaluate the vascular anatomy. Carotid stenosis measurements (when applicable) are obtained utilizing NASCET criteria, using the distal internal carotid diameter as the denominator. RADIATION DOSE REDUCTION: This exam was performed according to the departmental dose-optimization program which includes automated exposure control, adjustment of the mA and/or kV according to patient size and/or use of iterative reconstruction technique. CONTRAST:  75mL OMNIPAQUE IOHEXOL 350 MG/ML SOLN COMPARISON:  CT head March 9, 25. FINDINGS: CTA NECK FINDINGS Aortic arch: Aortic atherosclerosis. Great vessel origins are patent without significant stenosis. Right carotid system: Atherosclerosis at the carotid bifurcation without greater than 50% stenosis. Left carotid system: Atherosclerosis at the carotid bifurcation without greater than 50% stenosis. Vertebral arteries: Moderate stenosis of the right vertebral artery at its origin and at C4 and C5 due to mass effect from adjacent osteophytes. Left vertebral artery  is patent with severe origin stenosis and severe foraminal stenosis at C5-C6 due to mass effect from osteophytes. Skeleton: Severe multilevel degenerative change including posterior disc osteophyte complexes and facet/uncovertebral hypertrophy resulting in multilevel foraminal stenosis. Other neck: No acute abnormality on limited assessment. Upper chest: Visualized lung apices are clear. Review of the MIP images confirms the above findings CTA HEAD FINDINGS Anterior circulation: Bilateral intracranial ICAs, MCAs, and ACAs are patent without proximal hemodynamically significant stenosis. Posterior circulation: Bilateral intradural vertebral arteries are patent.  Severe proximal left intradural vertebral artery stenosis. Basilar artery and bilateral posterior cerebral arteries are patent. 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 Venous sinuses: As permitted by contrast timing, patent. Review of the MIP images confirms the above findings IMPRESSION: 1. No emergent large vessel occlusion. 2. 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 3. 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 4.  Aortic Atherosclerosis (ICD10-I70.0). Electronically Signed   By: Feliberto Harts M.D.   On: 06/02/2023 20:01   MR Brain Wo Contrast (neuro protocol) Result Date: 06/02/2023 CLINICAL DATA:  68 year old female neurologic deficit. Fall. Blunt plate trauma. Head and neck pain. EXAM: MRI HEAD WITHOUT CONTRAST TECHNIQUE: Multiplanar, multiecho pulse sequences of the brain and surrounding structures were obtained without intravenous contrast. COMPARISON:  CT head and cervical spine 0928 hours today. Brain MRI 08/26/2021. FINDINGS: Brain: Small subcentimeter, 4-5 mm slightly subtle focus of restricted diffusion in the central thalamus (series 5, image 72 and series 6, image 24). No other diffusion restriction. Mild T2 and FLAIR hyperintensity there, superimposed on nearby chronic lacunar infarct of the posterior right internal capsule which was present in 2023. Contralateral chronic left lacunar infarct is stable. Other scattered bilateral cerebral white matter and occasionally cortical (series 11, image 29), insular (series 11, image 28) T2 and FLAIR hyperintensity is stable since 2023. moderate patchy T2 heterogeneity in the pons is stable. No chronic cerebral blood products. Negative cerebellum for age. No midline shift, mass effect, evidence of mass lesion, ventriculomegaly, extra-axial collection or acute intracranial hemorrhage. Cervicomedullary junction and pituitary are within normal limits. Vascular: Major intracranial vascular flow voids  are stable. Skull and upper cervical spine: Negative for age visible cervical spine. Visualized bone marrow signal is within normal limits. Sinuses/Orbits: Stable, negative. Other: Negative visible scalp and face. IMPRESSION: 1. Positive for small Acute lacunar infarct of the central right thalamus. No hemorrhage or mass effect. 2. Underlying advanced chronic small vessel disease otherwise not significantly changed since 2023. Electronically Signed   By: Odessa Fleming M.D.   On: 06/02/2023 12:36   CT HEAD WO CONTRAST Result Date: 06/02/2023 CLINICAL DATA:  Fall.  Blunt plate trauma.  Head and neck pain. EXAM: CT HEAD WITHOUT CONTRAST CT CERVICAL SPINE WITHOUT CONTRAST TECHNIQUE: Multidetector CT imaging of the head and cervical spine was performed following the standard protocol without intravenous contrast. Multiplanar CT image reconstructions of the cervical spine were also generated. RADIATION DOSE REDUCTION: This exam was performed according to the departmental dose-optimization program which includes automated exposure control, adjustment of the mA and/or kV according to patient size and/or use of iterative reconstruction technique. COMPARISON:  11/23/2012 FINDINGS: CT HEAD FINDINGS Brain: No evidence of intracranial hemorrhage, acute infarction, hydrocephalus, extra-axial collection, or mass lesion/mass effect. Mild chronic small vessel disease shows progression since 2014. Vascular:  No hyperdense vessel or other acute findings. Skull: No evidence of fracture or other significant bone abnormality. Sinuses/Orbits:  No acute findings. Other: None. CT CERVICAL SPINE  FINDINGS Alignment: Normal. Skull base and vertebrae: No acute fracture. No primary bone lesion or focal pathologic process. Soft tissues and spinal canal: No prevertebral fluid or swelling. No visible canal hematoma. Disc levels: Moderate degenerative disc disease again seen at C5-6 and C6-7, with prominent osteophytosis. Moderate left-sided facet DJD  is seen at C2-3 and C3-4, increased since prior exam. Upper chest: No acute findings. Other: None. IMPRESSION: No acute intracranial abnormality. Mild chronic small vessel disease. No evidence of cervical spine fracture or subluxation. Moderate degenerative disc disease at C5-6 and C6-7. Moderate left-sided facet DJD at C2-3 and C3-4. Electronically Signed   By: Danae Orleans M.D.   On: 06/02/2023 10:48   CT CERVICAL SPINE WO CONTRAST Result Date: 06/02/2023 CLINICAL DATA:  Fall.  Blunt plate trauma.  Head and neck pain. EXAM: CT HEAD WITHOUT CONTRAST CT CERVICAL SPINE WITHOUT CONTRAST TECHNIQUE: Multidetector CT imaging of the head and cervical spine was performed following the standard protocol without intravenous contrast. Multiplanar CT image reconstructions of the cervical spine were also generated. RADIATION DOSE REDUCTION: This exam was performed according to the departmental dose-optimization program which includes automated exposure control, adjustment of the mA and/or kV according to patient size and/or use of iterative reconstruction technique. COMPARISON:  11/23/2012 FINDINGS: CT HEAD FINDINGS Brain: No evidence of intracranial hemorrhage, acute infarction, hydrocephalus, extra-axial collection, or mass lesion/mass effect. Mild chronic small vessel disease shows progression since 2014. Vascular:  No hyperdense vessel or other acute findings. Skull: No evidence of fracture or other significant bone abnormality. Sinuses/Orbits:  No acute findings. Other: None. CT CERVICAL SPINE FINDINGS Alignment: Normal. Skull base and vertebrae: No acute fracture. No primary bone lesion or focal pathologic process. Soft tissues and spinal canal: No prevertebral fluid or swelling. No visible canal hematoma. Disc levels: Moderate degenerative disc disease again seen at C5-6 and C6-7, with prominent osteophytosis. Moderate left-sided facet DJD is seen at C2-3 and C3-4, increased since prior exam. Upper chest: No acute  findings. Other: None. IMPRESSION: No acute intracranial abnormality. Mild chronic small vessel disease. No evidence of cervical spine fracture or subluxation. Moderate degenerative disc disease at C5-6 and C6-7. Moderate left-sided facet DJD at C2-3 and C3-4. Electronically Signed   By: Danae Orleans M.D.   On: 06/02/2023 10:48   DG Pelvis Portable Result Date: 06/02/2023 CLINICAL DATA:  Pain after trauma EXAM: PORTABLE PELVIS 1 VIEWS COMPARISON:  CT chest abdomen pelvis 09/30/2022. FINDINGS: No fracture or dislocation. Preserved joint spaces. Osteopenia. Vascular calcification seen. Right-sided ureteral stent in place. Degenerative changes of the visualized lumbar spine IMPRESSION: No acute osseous abnormality. Electronically Signed   By: Karen Kays M.D.   On: 06/02/2023 10:22   DG FEMUR PORT 1V LEFT Result Date: 06/02/2023 CLINICAL DATA:  Pain after trauma EXAM: LEFT FEMUR PORTABLE 1 VIEW COMPARISON:  None Available. FINDINGS: Osteopenia. No fracture or dislocation. Degenerative changes seen about the new particularly with joint space loss of the medial compartment. Scattered vascular calcifications. IMPRESSION: Osteopenia. Degenerative changes of the knee joint. Limited one view exam Electronically Signed   By: Karen Kays M.D.   On: 06/02/2023 10:21   DG Chest Port 1 View Result Date: 06/02/2023 CLINICAL DATA:  Fall.  Found down. EXAM: PORTABLE CHEST 1 VIEW COMPARISON:  10/04/2022 FINDINGS: The lungs are clear without focal pneumonia, edema, pneumothorax or pleural effusion. Minimal atelectasis noted left base. The cardiopericardial silhouette is within normal limits for size. No acute bony abnormality. Telemetry leads overlie the chest. IMPRESSION: Minimal left  base atelectasis. Otherwise no acute cardiopulmonary findings. Electronically Signed   By: Kennith Center M.D.   On: 06/02/2023 10:20        Scheduled Meds:   stroke: early stages of recovery book   Does not apply Once   aspirin EC  81  mg Oral Daily   atorvastatin  40 mg Oral QHS   busPIRone  7.5 mg Oral BID   clopidogrel  75 mg Oral Daily   FLUoxetine  40 mg Oral Daily   heparin  5,000 Units Subcutaneous Q12H   insulin aspart  0-15 Units Subcutaneous Q4H   pantoprazole (PROTONIX) IV  40 mg Intravenous Q24H   rOPINIRole  0.25 mg Oral QHS   tamsulosin  0.4 mg Oral Daily   topiramate  25 mg Oral Daily   Continuous Infusions:  sodium chloride 20 mL/hr at 06/02/23 1656     LOS: 1 day    Time spent: 35 minutes.     Alba Cory, MD [facility]   If 7PM-7AM, please contact night-coverage [email or web address]  06/03/2023, 7:06 AM

## 2023-06-04 ENCOUNTER — Other Ambulatory Visit (HOSPITAL_COMMUNITY): Payer: Self-pay

## 2023-06-04 DIAGNOSIS — I639 Cerebral infarction, unspecified: Secondary | ICD-10-CM | POA: Diagnosis not present

## 2023-06-04 DIAGNOSIS — Y92009 Unspecified place in unspecified non-institutional (private) residence as the place of occurrence of the external cause: Secondary | ICD-10-CM | POA: Diagnosis not present

## 2023-06-04 DIAGNOSIS — W19XXXA Unspecified fall, initial encounter: Secondary | ICD-10-CM | POA: Diagnosis not present

## 2023-06-04 LAB — GLUCOSE, CAPILLARY
Glucose-Capillary: 123 mg/dL — ABNORMAL HIGH (ref 70–99)
Glucose-Capillary: 159 mg/dL — ABNORMAL HIGH (ref 70–99)
Glucose-Capillary: 126 mg/dL — ABNORMAL HIGH (ref 70–99)
Glucose-Capillary: 177 mg/dL — ABNORMAL HIGH (ref 70–99)

## 2023-06-04 MED ORDER — TOUJEO SOLOSTAR 300 UNIT/ML ~~LOC~~ SOPN
10.0000 [IU] | PEN_INJECTOR | Freq: Every day | SUBCUTANEOUS | 0 refills | Status: DC
  Filled 2023-06-04: qty 1.5, fill #0

## 2023-06-04 MED ORDER — CALCIUM CARBONATE ANTACID 500 MG PO CHEW
1.0000 | CHEWABLE_TABLET | Freq: Three times a day (TID) | ORAL | Status: DC | PRN
  Administered 2023-06-04: 200 mg via ORAL
  Filled 2023-06-04 (×2): qty 1

## 2023-06-04 MED ORDER — INSULIN ASPART 100 UNIT/ML IJ SOLN
0.0000 [IU] | Freq: Three times a day (TID) | INTRAMUSCULAR | Status: DC
  Administered 2023-06-05: 1 [IU] via SUBCUTANEOUS
  Administered 2023-06-05: 5 [IU] via SUBCUTANEOUS

## 2023-06-04 MED ORDER — TOPIRAMATE 50 MG PO TABS
50.0000 mg | ORAL_TABLET | Freq: Every day | ORAL | 1 refills | Status: DC
Start: 2023-06-05 — End: 2023-06-05
  Filled 2023-06-04: qty 30, 30d supply, fill #0

## 2023-06-04 MED ORDER — GLUCERNA SHAKE PO LIQD
237.0000 mL | Freq: Two times a day (BID) | ORAL | Status: DC
  Administered 2023-06-05 (×2): 237 mL via ORAL

## 2023-06-04 MED ORDER — INSULIN ASPART 100 UNIT/ML IJ SOLN: 0.0000 [IU] | Freq: Every day | INTRAMUSCULAR | Status: DC

## 2023-06-04 MED ORDER — ATORVASTATIN CALCIUM 80 MG PO TABS
80.0000 mg | ORAL_TABLET | Freq: Every day | ORAL | 1 refills | Status: DC
  Filled 2023-06-04: qty 30, 30d supply, fill #0

## 2023-06-04 MED ORDER — TICAGRELOR 90 MG PO TABS
90.0000 mg | ORAL_TABLET | Freq: Two times a day (BID) | ORAL | 0 refills | Status: AC
Start: 2023-06-04 — End: 2023-07-04
  Filled 2023-06-04: qty 60, 30d supply, fill #0

## 2023-06-04 NOTE — Progress Notes (Signed)
 SLP Cancellation Note  Patient Details Name: LORELL THIBODAUX MRN: [id number] DOB: [birth year]   Cancelled treatment:       Reason Eval/Treat Not Completed: SLP screened, no needs identified, will sign off. Pt is Ox4 and presents with clear speech that is 100% fluent and intelligible. Pt denied acute changes to speech, language, or cognition since her CVA. She politely declined a formal speech/language assessment at this time. SLP will sign off.    Ellery Plunk 06/04/2023, 9:21 AM

## 2023-06-04 NOTE — Discharge Summary (Signed)
 Physician Discharge Summary   Patient: Anna [name] MRN: [id number] DOB: Mar 09, 1956  Admit date:     06/02/2023  Discharge date: 06/04/23  Discharge Physician: Alba Cory   PCP: [facility]   Recommendations at discharge:    Needs PT/OT Needs Brillinta for 4 weeks the aspirin alone.   Discharge Diagnoses: Principal Problem:   Fall at home, initial encounter Active Problems:   Acute CVA (cerebrovascular accident) [facility])   Essential hypertension   Hyperkalemia   Type 2 diabetes mellitus (HCC)   OSA (obstructive sleep apnea)   GERD (gastroesophageal reflux disease)  Resolved Problems:   * No resolved hospital problems. *  Hospital Course: 68 year old with past medical history significant for CVA on Plavix, diabetes, hypertension, left leg BKA, COPD, allergies to more than 14 medications, lives at home with, patient presents after a fall, she was trying to get up to transfer herself from the couch to the wheelchair, today she lost balance, hit her face.  Few days ago she had some diarrhea and also noticed that her face and left arm were numb a week ago.  During evaluation, MRI was positive for a small acute lacunar infarct of the central right thalamus.  Neurology was consulted.  Patient was admitted for further evaluation.    Assessment and Plan: 1-Acute CVA: -Patient presented with numbness and tingling of the left side of her face that is started a week ago.  Also presented after a fall -MRI for acute right thalamic infarct -Started on aspirin and Plavix by neurology -LDL 114, triglycerides 178 -CT angio neck and head:No emergent large vessel occlusion.  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 Multifocal severe left and moderate right vertebral artery stenosis. -A1c: 7.5 -ECHO: Ef 60% Started on aspirin and Brilinta for 4 weeks then aspirin alone. stroke prevention per neurology recommendation. No need to resume plavix for  stroke prevention. Per patient patient was taking plavix for stroke no for vascular diseases.  Therapy recommend rehab.  Neurology increase Topamax to help with numbness   Fall; -PT/ OT consulted -CT head, Cervical spine: No acute intracranial abnormality. Mild chronic small vessel disease. No evidence of cervical spine fracture or subluxation. Moderate degenerative disc disease at C5-6 and C6-7. Moderate left-sided facet DJD at C2-3 and C3-4. -Pelvis x-ray: No acute abnormality   Essential hypertension: -Permissive  hypertension in the setting of acute stroke. -resume  lisinopril   Diabetes type 2: -A1c 7.5 SSI Resume 10 units of Toujeo. Resume metformin.   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 Duoneb BID Chest x ray : Minimal left base atelectasis. Otherwise no acute cardiopulmonary findings. Cough Improved.   Estimated body mass index is 29.16 kg/m as calculated from the following:   Height as of this encounter: 5\' 1"  (1.549 m).   Weight as of this encounter: 70 kg.        Consultants: Neurology  Procedures performed: ECHO Disposition: Home Diet recommendation:  Carb modified diet DISCHARGE MEDICATION: Allergies as of 06/04/2023       Reactions   Lasix [furosemide] Shortness Of Breath, Rash   Bacitracin    Burns skin   Citalopram Diarrhea   Doxycycline Other (See Comments)   "burning" all over body   Morphine And Codeine Hives   Neomycin  Used for pink eye, may pink eye worse     Ciprofloxacin    Tongue swells   Ciprofloxacin-dexamethasone Other (See Comments)   Tongue swelling    Duloxetine    Mental Status Changes (intolerance) "saw pictures of gun"   Latex Itching   Levaquin [levofloxacin In D5w] Other (See Comments)   Pt does not remember reaction but states she's had  issues with med   Other    White pepper- uncontrollable sneezing    Salvia Officinalis    Sage- sneezing   Soma [carisoprodol]    Sleepy and constipation        Medication List     STOP taking these medications    clopidogrel 75 MG tablet Commonly known as: PLAVIX   ibuprofen 200 MG tablet Commonly known as: ADVIL   naproxen sodium 220 MG tablet Commonly known as: ALEVE       TAKE these medications    acetaminophen 500 MG tablet Commonly known as: TYLENOL Take 1,000-1,500 mg by mouth every 8 (eight) hours as needed for moderate pain (pain score 4-6).   albuterol 108 (90 Base) MCG/ACT inhaler Commonly known as: VENTOLIN HFA Inhale 1-2 puffs into the lungs every 6 (six) hours as needed for wheezing or shortness of breath.   aspirin EC 81 MG tablet Take 81 mg by mouth daily. Swallow whole.   atorvastatin 80 MG tablet Commonly known as: LIPITOR Take 1 tablet (80 mg total) by mouth at bedtime. What changed:  medication strength how much to take   benzonatate 100 MG capsule Commonly known as: TESSALON Take 100 mg by mouth 3 (three) times daily as needed for cough.   BIOFREEZE EX Apply 1 Application topically daily as needed (neck and shoulder pain).   busPIRone 7.5 MG tablet Commonly known as: BUSPAR Take 7.5 mg by mouth 2 (two) times daily.   Culturelle [employer] Wellness Caps Take 1 capsule by mouth daily.   diclofenac Sodium 1 % Gel Commonly known as: VOLTAREN Apply 4 g topically as needed (joint pain, phantom joint pain).   Dulaglutide 3 MG/0.5ML Soaj Inject 3 mg into the skin every Saturday.   estradiol 0.1 MG/GM vaginal cream Commonly known as: ESTRACE Place 1 Applicatorful vaginally as needed (dryness / irritation).   FLUoxetine 40 MG capsule Commonly known as: PROZAC Take 40 mg by mouth daily.   fluticasone 50 MCG/ACT nasal spray Commonly known as: FLONASE Place 1 spray into both nostrils daily as needed for allergies or rhinitis.    gabapentin 400 MG capsule Commonly known as: NEURONTIN Take 400 mg by mouth 3 (three) times daily.   GOODY HEADACHE PO Take 2 packets by mouth daily as needed (headaches).   hydrOXYzine 10 MG tablet Commonly known as: ATARAX Take 1 tablet (10 mg total) by mouth 2 (two) times daily. What changed: when to take this   insulin aspart 100 UNIT/ML FlexPen Commonly known as: NOVOLOG Use as directed before each meal 3 times a day: 140-199 - 2 units, 200-250 - 4 units, 251-299 - 6 units,  300-349 - 8 units,  350 or above 10 units. What changed:  how much to take how to take this when to take this reasons to take this additional instructions   ipratropium-albuterol 0.5-2.5 (3) MG/3ML Soln Commonly known as: DUONEB Use 3 ml by nebulization twice a day scheduled and every 4 hours as needed for shortness of breath and wheezing What changed:  how much to take how to take this when to  take this reasons to take this   lidocaine 4 % Place 1 patch onto the skin daily as needed (pain).   lisinopril 5 MG tablet Commonly known as: ZESTRIL Take 5 mg by mouth at bedtime.   loratadine 10 MG tablet Commonly known as: CLARITIN Take 10 mg by mouth daily as needed for allergies.   meclizine 25 MG tablet Commonly known as: ANTIVERT Take 25 mg by mouth daily as needed for dizziness.   metFORMIN 1000 MG tablet Commonly known as: GLUCOPHAGE Take 1,000 mg by mouth 2 (two) times daily with a meal.   nystatin 100000 UNIT/ML suspension Commonly known as: MYCOSTATIN Take 1 mL by mouth 4 (four) times daily as needed (red gums).   nystatin cream Commonly known as: MYCOSTATIN Apply 1 application. topically 4 (four) times daily as needed for dry skin.   ondansetron 4 MG disintegrating tablet Commonly known as: ZOFRAN-ODT Take 4 mg by mouth every 8 (eight) hours as needed for nausea or vomiting.   pantoprazole 40 MG tablet Commonly known as: PROTONIX Take 40 mg by mouth 2 (two) times daily.    Prevagen 10 MG Caps Generic drug: Apoaequorin Take 10 mg by mouth daily.   rOPINIRole 0.25 MG tablet Commonly known as: REQUIP Take 0.25 mg by mouth at bedtime.   sennosides-docusate sodium 8.6-50 MG tablet Commonly known as: SENOKOT-S Take 1 tablet by mouth 2 (two) times daily as needed for constipation.   tamsulosin 0.4 MG Caps capsule Commonly known as: FLOMAX Take 1 capsule (0.4 mg total) by mouth daily.   ticagrelor 90 MG Tabs tablet Commonly known as: BRILINTA Take 1 tablet (90 mg total) by mouth 2 (two) times daily.   topiramate 50 MG tablet Commonly known as: TOPAMAX Take 1 tablet (50 mg total) by mouth daily. Start taking on: June 05, 2023 What changed:  medication strength how much to take   Toujeo SoloStar 300 UNIT/ML Solostar Pen Generic drug: insulin glargine (1 Unit Dial) Inject 10 Units into the skin at bedtime. What changed: how much to take        Discharge Exam: Filed Weights   06/02/23 0954  Weight: 70 kg   General; NAD  Condition at discharge: stable  The results of significant diagnostics from this hospitalization (including imaging, microbiology, ancillary and laboratory) are listed below for reference.   Imaging Studies: ECHOCARDIOGRAM COMPLETE BUBBLE STUDY Result Date: 06/03/2023    ECHOCARDIOGRAM REPORT   Patient Name:   SHERRINE SALBERG Date of Exam: 06/03/2023 Medical Rec #:  [id number]        Height:       61.0 in Accession #:    [id number]       Weight:       154.3 lb Date of Birth:  [birth year]       BSA:          1.692 m Patient Age:    67 years         BP:           133/70 mmHg Patient Gender: F                HR:           85 bpm. Exam Location:  Inpatient Procedure: 2D Echo, Cardiac Doppler, Color Doppler and Saline Contrast Bubble            Study (Both Spectral and Color Flow Doppler were utilized during  procedure). Indications:    Stroke  History:        Patient has prior history of Echocardiogram examinations, most                  recent 08/25/2021. Risk Factors:Hypertension, Diabetes,                 Dyslipidemia and Sleep Apnea.  Sonographer:    Karma Ganja Referring Phys: [phone] EKTA V PATEL  Sonographer Comments: Technically challenging study due to limited acoustic windows. Image acquisition challenging due to patient body habitus. IMPRESSIONS  1. Left ventricular ejection fraction, by estimation, is 60 to 65%. The left ventricle has normal function. The left ventricle has no regional wall motion abnormalities. Left ventricular diastolic parameters were normal.  2. Right ventricular systolic function is normal. The right ventricular size is normal.  3. The mitral valve is normal in structure. Trivial mitral valve regurgitation. No evidence of mitral stenosis.  4. The aortic valve has an indeterminant number of cusps. Aortic valve regurgitation is not visualized. No aortic stenosis is present.  5. The inferior vena cava is normal in size with greater than 50% respiratory variability, suggesting right atrial pressure of 3 mmHg.  6. Agitated saline contrast bubble study was negative, with no evidence of any interatrial shunt. FINDINGS  Left Ventricle: Left ventricular ejection fraction, by estimation, is 60 to 65%. The left ventricle has normal function. The left ventricle has no regional wall motion abnormalities. The left ventricular internal cavity size was normal in size. There is  no left ventricular hypertrophy. Left ventricular diastolic parameters were normal. Right Ventricle: The right ventricular size is normal. No increase in right ventricular wall thickness. Right ventricular systolic function is normal. Left Atrium: Left atrial size was normal in size. Right Atrium: Right atrial size was normal in size. Pericardium: There is no evidence of pericardial effusion. Mitral Valve: The mitral valve is normal in structure. Trivial mitral valve regurgitation. No evidence of mitral valve stenosis. MV peak gradient, 9.5 mmHg.  The mean mitral valve gradient is 4.0 mmHg. Tricuspid Valve: The tricuspid valve is normal in structure. Tricuspid valve regurgitation is not demonstrated. No evidence of tricuspid stenosis. Aortic Valve: The aortic valve has an indeterminant number of cusps. Aortic valve regurgitation is not visualized. No aortic stenosis is present. Aortic valve mean gradient measures 4.0 mmHg. Aortic valve peak gradient measures 8.3 mmHg. Aortic valve area, by VTI measures 2.00 cm. Pulmonic Valve: The pulmonic valve was normal in structure. Pulmonic valve regurgitation is not visualized. No evidence of pulmonic stenosis. Aorta: The aortic root is normal in size and structure. Venous: The inferior vena cava is normal in size with greater than 50% respiratory variability, suggesting right atrial pressure of 3 mmHg. IAS/Shunts: No atrial level shunt detected by color flow Doppler. Agitated saline contrast was given intravenously to evaluate for intracardiac shunting. Agitated saline contrast bubble study was negative, with no evidence of any interatrial shunt.  LEFT VENTRICLE PLAX 2D LVIDd:         4.40 cm   Diastology LVIDs:         3.00 cm   LV e' medial:    7.93 cm/s LV PW:         0.80 cm   LV E/e' medial:  13.1 LV IVS:        0.80 cm   LV e' lateral:   12.90 cm/s LVOT diam:     2.00 cm   LV E/e' lateral: 8.1  LV SV:         55 LV SV Index:   32 LVOT Area:     3.14 cm  RIGHT VENTRICLE             IVC RV Basal diam:  3.10 cm     IVC diam: 1.20 cm RV S prime:     11.20 cm/s TAPSE (M-mode): 1.7 cm LEFT ATRIUM             Index        RIGHT ATRIUM           Index LA diam:        3.00 cm 1.77 cm/m   RA Area:     10.90 cm LA Vol (A2C):   46.6 ml 27.55 ml/m  RA Volume:   20.30 ml  12.00 ml/m LA Vol (A4C):   37.2 ml 21.99 ml/m LA Biplane Vol: 42.4 ml 25.06 ml/m  AORTIC VALVE AV Area (Vmax):    2.02 cm AV Area (Vmean):   1.75 cm AV Area (VTI):     2.00 cm AV Vmax:           144.00 cm/s AV Vmean:          95.000 cm/s AV VTI:             0.273 m AV Peak Grad:      8.3 mmHg AV Mean Grad:      4.0 mmHg LVOT Vmax:         92.50 cm/s LVOT Vmean:        52.900 cm/s LVOT VTI:          0.174 m LVOT/AV VTI ratio: 0.64  AORTA Ao Root diam: 2.70 cm MITRAL VALVE MV Area (PHT): 3.53 cm     SHUNTS MV Area VTI:   1.53 cm     Systemic VTI:  0.17 m MV Peak grad:  9.5 mmHg     Systemic Diam: 2.00 cm MV Mean grad:  4.0 mmHg MV Vmax:       1.54 m/s MV Vmean:      98.5 cm/s MV Decel Time: 215 msec MV E velocity: 104.00 cm/s MV A velocity: 119.00 cm/s MV E/A ratio:  0.87 Aditya Sabharwal Electronically signed by Dorthula Nettles Signature Date/Time: 06/03/2023/11:14:02 AM    Final    CT ANGIO HEAD NECK W WO CM Result Date: 06/02/2023 CLINICAL DATA:  Stroke/TIA, determine embolic source EXAM: CT ANGIOGRAPHY HEAD AND NECK WITH AND WITHOUT CONTRAST TECHNIQUE: Multidetector CT imaging of the head and neck was performed using the standard protocol during bolus administration of intravenous contrast. Multiplanar CT image reconstructions and MIPs were obtained to evaluate the vascular anatomy. Carotid stenosis measurements (when applicable) are obtained utilizing NASCET criteria, using the distal internal carotid diameter as the denominator. RADIATION DOSE REDUCTION: This exam was performed according to the departmental dose-optimization program which includes automated exposure control, adjustment of the mA and/or kV according to patient size and/or use of iterative reconstruction technique. CONTRAST:  75mL OMNIPAQUE IOHEXOL 350 MG/ML SOLN COMPARISON:  CT head March 9, 25. FINDINGS: CTA NECK FINDINGS Aortic arch: Aortic atherosclerosis. Great vessel origins are patent without significant stenosis. Right carotid system: Atherosclerosis at the carotid bifurcation without greater than 50% stenosis. Left carotid system: Atherosclerosis at the carotid bifurcation without greater than 50% stenosis. Vertebral arteries: Moderate stenosis of the right vertebral artery at its  origin and at C4 and C5 due to mass effect from adjacent osteophytes. Left  vertebral artery is patent with severe origin stenosis and severe foraminal stenosis at C5-C6 due to mass effect from osteophytes. Skeleton: Severe multilevel degenerative change including posterior disc osteophyte complexes and facet/uncovertebral hypertrophy resulting in multilevel foraminal stenosis. Other neck: No acute abnormality on limited assessment. Upper chest: Visualized lung apices are clear. Review of the MIP images confirms the above findings CTA HEAD FINDINGS Anterior circulation: Bilateral intracranial ICAs, MCAs, and ACAs are patent without proximal hemodynamically significant stenosis. Posterior circulation: Bilateral intradural vertebral arteries are patent. Severe proximal left intradural vertebral artery stenosis. Basilar artery and bilateral posterior cerebral arteries are patent. 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 Venous sinuses: As permitted by contrast timing, patent. Review of the MIP images confirms the above findings IMPRESSION: 1. No emergent large vessel occlusion. 2. 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 3. 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 4.  Aortic Atherosclerosis (ICD10-I70.0). Electronically Signed   By: Feliberto Harts M.D.   On: 06/02/2023 20:01   MR Brain Wo Contrast (neuro protocol) Result Date: 06/02/2023 CLINICAL DATA:  68 year old female neurologic deficit. Fall. Blunt plate trauma. Head and neck pain. EXAM: MRI HEAD WITHOUT CONTRAST TECHNIQUE: Multiplanar, multiecho pulse sequences of the brain and surrounding structures were obtained without intravenous contrast. COMPARISON:  CT head and cervical spine 0928 hours today. Brain MRI 08/26/2021. FINDINGS: Brain: Small subcentimeter, 4-5 mm slightly subtle focus of restricted diffusion in the central thalamus (series 5, image 72 and series 6, image 24). No other diffusion  restriction. Mild T2 and FLAIR hyperintensity there, superimposed on nearby chronic lacunar infarct of the posterior right internal capsule which was present in 2023. Contralateral chronic left lacunar infarct is stable. Other scattered bilateral cerebral white matter and occasionally cortical (series 11, image 29), insular (series 11, image 28) T2 and FLAIR hyperintensity is stable since 2023. moderate patchy T2 heterogeneity in the pons is stable. No chronic cerebral blood products. Negative cerebellum for age. No midline shift, mass effect, evidence of mass lesion, ventriculomegaly, extra-axial collection or acute intracranial hemorrhage. Cervicomedullary junction and pituitary are within normal limits. Vascular: Major intracranial vascular flow voids are stable. Skull and upper cervical spine: Negative for age visible cervical spine. Visualized bone marrow signal is within normal limits. Sinuses/Orbits: Stable, negative. Other: Negative visible scalp and face. IMPRESSION: 1. Positive for small Acute lacunar infarct of the central right thalamus. No hemorrhage or mass effect. 2. Underlying advanced chronic small vessel disease otherwise not significantly changed since 2023. Electronically Signed   By: Odessa Fleming M.D.   On: 06/02/2023 12:36   CT HEAD WO CONTRAST Result Date: 06/02/2023 CLINICAL DATA:  Fall.  Blunt plate trauma.  Head and neck pain. EXAM: CT HEAD WITHOUT CONTRAST CT CERVICAL SPINE WITHOUT CONTRAST TECHNIQUE: Multidetector CT imaging of the head and cervical spine was performed following the standard protocol without intravenous contrast. Multiplanar CT image reconstructions of the cervical spine were also generated. RADIATION DOSE REDUCTION: This exam was performed according to the departmental dose-optimization program which includes automated exposure control, adjustment of the mA and/or kV according to patient size and/or use of iterative reconstruction technique. COMPARISON:  11/23/2012  FINDINGS: CT HEAD FINDINGS Brain: No evidence of intracranial hemorrhage, acute infarction, hydrocephalus, extra-axial collection, or mass lesion/mass effect. Mild chronic small vessel disease shows progression since 2014. Vascular:  No hyperdense vessel or other acute findings. Skull: No evidence of fracture or other significant bone abnormality. Sinuses/Orbits:  No acute findings. Other: None. CT  CERVICAL SPINE FINDINGS Alignment: Normal. Skull base and vertebrae: No acute fracture. No primary bone lesion or focal pathologic process. Soft tissues and spinal canal: No prevertebral fluid or swelling. No visible canal hematoma. Disc levels: Moderate degenerative disc disease again seen at C5-6 and C6-7, with prominent osteophytosis. Moderate left-sided facet DJD is seen at C2-3 and C3-4, increased since prior exam. Upper chest: No acute findings. Other: None. IMPRESSION: No acute intracranial abnormality. Mild chronic small vessel disease. No evidence of cervical spine fracture or subluxation. Moderate degenerative disc disease at C5-6 and C6-7. Moderate left-sided facet DJD at C2-3 and C3-4. Electronically Signed   By: Danae Orleans M.D.   On: 06/02/2023 10:48   CT CERVICAL SPINE WO CONTRAST Result Date: 06/02/2023 CLINICAL DATA:  Fall.  Blunt plate trauma.  Head and neck pain. EXAM: CT HEAD WITHOUT CONTRAST CT CERVICAL SPINE WITHOUT CONTRAST TECHNIQUE: Multidetector CT imaging of the head and cervical spine was performed following the standard protocol without intravenous contrast. Multiplanar CT image reconstructions of the cervical spine were also generated. RADIATION DOSE REDUCTION: This exam was performed according to the departmental dose-optimization program which includes automated exposure control, adjustment of the mA and/or kV according to patient size and/or use of iterative reconstruction technique. COMPARISON:  11/23/2012 FINDINGS: CT HEAD FINDINGS Brain: No evidence of intracranial hemorrhage, acute  infarction, hydrocephalus, extra-axial collection, or mass lesion/mass effect. Mild chronic small vessel disease shows progression since 2014. Vascular:  No hyperdense vessel or other acute findings. Skull: No evidence of fracture or other significant bone abnormality. Sinuses/Orbits:  No acute findings. Other: None. CT CERVICAL SPINE FINDINGS Alignment: Normal. Skull base and vertebrae: No acute fracture. No primary bone lesion or focal pathologic process. Soft tissues and spinal canal: No prevertebral fluid or swelling. No visible canal hematoma. Disc levels: Moderate degenerative disc disease again seen at C5-6 and C6-7, with prominent osteophytosis. Moderate left-sided facet DJD is seen at C2-3 and C3-4, increased since prior exam. Upper chest: No acute findings. Other: None. IMPRESSION: No acute intracranial abnormality. Mild chronic small vessel disease. No evidence of cervical spine fracture or subluxation. Moderate degenerative disc disease at C5-6 and C6-7. Moderate left-sided facet DJD at C2-3 and C3-4. Electronically Signed   By: Danae Orleans M.D.   On: 06/02/2023 10:48   DG Pelvis Portable Result Date: 06/02/2023 CLINICAL DATA:  Pain after trauma EXAM: PORTABLE PELVIS 1 VIEWS COMPARISON:  CT chest abdomen pelvis 09/30/2022. FINDINGS: No fracture or dislocation. Preserved joint spaces. Osteopenia. Vascular calcification seen. Right-sided ureteral stent in place. Degenerative changes of the visualized lumbar spine IMPRESSION: No acute osseous abnormality. Electronically Signed   By: Karen Kays M.D.   On: 06/02/2023 10:22   DG FEMUR PORT 1V LEFT Result Date: 06/02/2023 CLINICAL DATA:  Pain after trauma EXAM: LEFT FEMUR PORTABLE 1 VIEW COMPARISON:  None Available. FINDINGS: Osteopenia. No fracture or dislocation. Degenerative changes seen about the new particularly with joint space loss of the medial compartment. Scattered vascular calcifications. IMPRESSION: Osteopenia. Degenerative changes of the  knee joint. Limited one view exam Electronically Signed   By: Karen Kays M.D.   On: 06/02/2023 10:21   DG Chest Port 1 View Result Date: 06/02/2023 CLINICAL DATA:  Fall.  Found down. EXAM: PORTABLE CHEST 1 VIEW COMPARISON:  10/04/2022 FINDINGS: The lungs are clear without focal pneumonia, edema, pneumothorax or pleural effusion. Minimal atelectasis noted left base. The cardiopericardial silhouette is within normal limits for size. No acute bony abnormality. Telemetry leads overlie the chest. IMPRESSION:  Minimal left base atelectasis. Otherwise no acute cardiopulmonary findings. Electronically Signed   By: Kennith Center M.D.   On: 06/02/2023 10:20    Microbiology: Results for orders placed or performed during the hospital encounter of 09/30/22  Culture, blood (Routine x 2)     Status: None   Collection Time: 09/30/22  6:30 AM   Specimen: BLOOD LEFT FOREARM  Result Value Ref Range Status   Specimen Description BLOOD LEFT FOREARM  Final   Special Requests   Final    BOTTLES DRAWN AEROBIC AND ANAEROBIC Blood Culture adequate volume   Culture   Final    NO GROWTH 5 DAYS Performed at [facility], [address] [place], [place] [address]    Report Status 10/05/2022 FINAL  Final  Culture, blood (Routine x 2)     Status: Abnormal   Collection Time: 09/30/22  6:30 AM   Specimen: BLOOD  Result Value Ref Range Status   Specimen Description BLOOD LEFT ANTECUBITAL  Final   Special Requests   Final    BOTTLES DRAWN AEROBIC AND ANAEROBIC Blood Culture results may not be optimal due to an excessive volume of blood received in culture bottles   Culture  Setup Time   Final    GRAM NEGATIVE RODS AEROBIC BOTTLE ONLY CRITICAL RESULT CALLED TO, READ BACK BY AND VERIFIED WITH: Chestine Spore [phone] [id number] BY Berline Chough MT Performed at [facility], [address], [place], [place] [address]    Culture ESCHERICHIA COLI (A)  Final   Report Status 10/03/2022 FINAL  Final   Organism ID,  Bacteria ESCHERICHIA COLI  Final      Susceptibility   Escherichia coli - MIC*    AMPICILLIN >=32 RESISTANT Resistant     CEFEPIME <=0.12 SENSITIVE Sensitive     CEFTAZIDIME <=1 SENSITIVE Sensitive     CEFTRIAXONE 1 SENSITIVE Sensitive     CIPROFLOXACIN <=0.25 SENSITIVE Sensitive     GENTAMICIN <=1 SENSITIVE Sensitive     IMIPENEM <=0.25 SENSITIVE Sensitive     TRIMETH/SULFA <=20 SENSITIVE Sensitive     AMPICILLIN/SULBACTAM >=32 RESISTANT Resistant     PIP/TAZO 8 SENSITIVE Sensitive     * ESCHERICHIA COLI  Resp panel by RT-PCR (RSV, Flu A&B, Covid) Anterior Nasal Swab     Status: None   Collection Time: 09/30/22  6:30 AM   Specimen: Anterior Nasal Swab  Result Value Ref Range Status   SARS Coronavirus 2 by RT PCR NEGATIVE NEGATIVE Final   Influenza A by PCR NEGATIVE NEGATIVE Final   Influenza B by PCR NEGATIVE NEGATIVE Final    Comment: (NOTE) The Xpert Xpress SARS-CoV-2/FLU/RSV plus assay is intended as an aid in the diagnosis of influenza from Nasopharyngeal swab specimens and should not be used as a sole basis for treatment. Nasal washings and aspirates are unacceptable for Xpert Xpress SARS-CoV-2/FLU/RSV testing.  Fact Sheet for Patients: BloggerCourse.com  Fact Sheet for Healthcare Providers: SeriousBroker.it  This test is not yet approved or cleared by the [place] FDA and has been authorized for detection and/or diagnosis of SARS-CoV-2 by FDA under an Emergency Use Authorization (EUA). This EUA will remain in effect (meaning this test can be used) for the duration of the COVID-19 declaration under Section 564(b)(1) of the Act, 21 U.S.C. section 360bbb-3(b)(1), unless the authorization is terminated or revoked.     Resp Syncytial Virus by PCR NEGATIVE NEGATIVE Final    Comment: (NOTE) Fact Sheet for Patients: BloggerCourse.com  Fact  Sheet for Healthcare  Providers: SeriousBroker.it  This test is not yet approved or cleared by the [place] and has been authorized for detection and/or diagnosis of SARS-CoV-2 by FDA under an Emergency Use Authorization (EUA). This EUA will remain in effect (meaning this test can be used) for the duration of the COVID-19 declaration under Section 564(b)(1) of the Act, 21 U.S.C. section 360bbb-3(b)(1), unless the authorization is terminated or revoked.  Performed at [facility], [address], [place], [place] [address]   Blood Culture ID Panel (Reflexed)     Status: Abnormal   Collection Time: 09/30/22  6:30 AM  Result Value Ref Range Status   Enterococcus faecalis NOT DETECTED NOT DETECTED Final   Enterococcus Faecium NOT DETECTED NOT DETECTED Final   Listeria monocytogenes NOT DETECTED NOT DETECTED Final   Staphylococcus species NOT DETECTED NOT DETECTED Final   Staphylococcus aureus (BCID) NOT DETECTED NOT DETECTED Final   Staphylococcus epidermidis NOT DETECTED NOT DETECTED Final   Staphylococcus lugdunensis NOT DETECTED NOT DETECTED Final   Streptococcus species NOT DETECTED NOT DETECTED Final   Streptococcus agalactiae NOT DETECTED NOT DETECTED Final   Streptococcus pneumoniae NOT DETECTED NOT DETECTED Final   Streptococcus pyogenes NOT DETECTED NOT DETECTED Final   A.calcoaceticus-baumannii NOT DETECTED NOT DETECTED Final   Bacteroides fragilis NOT DETECTED NOT DETECTED Final   Enterobacterales DETECTED (A) NOT DETECTED Final    Comment: Enterobacterales represent a large order of gram negative bacteria, not a single organism. CRITICAL RESULT CALLED TO, READ BACK BY AND VERIFIED WITH: PHARMD LAUREN BELL [id number] [id number] BY J RAZZAK MT    Enterobacter cloacae complex NOT DETECTED NOT DETECTED Final   Escherichia coli DETECTED (A) NOT DETECTED Final    Comment: CRITICAL RESULT CALLED TO, READ BACK BY AND VERIFIED WITH: PHARMD LAUREN BELL [phone] [id number] BY J  RAZZAK MT    Klebsiella aerogenes NOT DETECTED NOT DETECTED Final   Klebsiella oxytoca NOT DETECTED NOT DETECTED Final   Klebsiella pneumoniae NOT DETECTED NOT DETECTED Final   Proteus species NOT DETECTED NOT DETECTED Final   Salmonella species NOT DETECTED NOT DETECTED Final   Serratia marcescens NOT DETECTED NOT DETECTED Final   Haemophilus influenzae NOT DETECTED NOT DETECTED Final   Neisseria meningitidis NOT DETECTED NOT DETECTED Final   Pseudomonas aeruginosa NOT DETECTED NOT DETECTED Final   Stenotrophomonas maltophilia NOT DETECTED NOT DETECTED Final   Candida albicans NOT DETECTED NOT DETECTED Final   Candida auris NOT DETECTED NOT DETECTED Final   Candida glabrata NOT DETECTED NOT DETECTED Final   Candida krusei NOT DETECTED NOT DETECTED Final   Candida parapsilosis NOT DETECTED NOT DETECTED Final   Candida tropicalis NOT DETECTED NOT DETECTED Final   Cryptococcus neoformans/gattii NOT DETECTED NOT DETECTED Final   CTX-M ESBL NOT DETECTED NOT DETECTED Final   Carbapenem resistance IMP NOT DETECTED NOT DETECTED Final   Carbapenem resistance KPC NOT DETECTED NOT DETECTED Final   Carbapenem resistance NDM NOT DETECTED NOT DETECTED Final   Carbapenem resist OXA 48 LIKE NOT DETECTED NOT DETECTED Final   Carbapenem resistance VIM NOT DETECTED NOT DETECTED Final    Comment: Performed at [facility], [address] [place], [place] [address]  Urine Culture     Status: Abnormal   Collection Time: 09/30/22  9:00 AM   Specimen: Urine, Random  Result Value Ref Range Status   Specimen Description URINE, RANDOM  Final   Special Requests URINE, CLEAN CATCH  Final   Culture (  A)  Final    <10,000 COLONIES/mL INSIGNIFICANT GROWTH Performed at [facility], [address], [place], [place] [address]    Report Status 10/01/2022 FINAL  Final  Urine Culture     Status: None   Collection Time: 09/30/22  2:30 PM   Specimen: Urine, Cystoscope  Result Value Ref Range Status    Specimen Description PELVIS  Final   Special Requests RIGHT RENAL, UR  Final   Culture   Final    NO GROWTH Performed at [facility], [address], [place], [place] [address]    Report Status 10/01/2022 FINAL  Final  MRSA Next Gen by PCR, Nasal     Status: None   Collection Time: 10/01/22  5:54 AM   Specimen: Nasal Mucosa; Nasal Swab  Result Value Ref Range Status   MRSA by PCR Next Gen NOT DETECTED NOT DETECTED Final    Comment: (NOTE) The GeneXpert MRSA Assay (FDA approved for NASAL specimens only), is one component of a comprehensive MRSA colonization surveillance program. It is not intended to diagnose MRSA infection nor to guide or monitor treatment for MRSA infections. Test performance is not FDA approved in patients less than 32 years old. Performed at [facility], [address] [place], [place] [address]   Group A Strep by PCR     Status: None   Collection Time: 10/02/22  3:56 PM   Specimen: Throat; Sterile Swab  Result Value Ref Range Status   Group A Strep by PCR NOT DETECTED NOT DETECTED Final    Comment: Performed at [facility], [address] [place], [place] [address]  Culture, blood (Routine X 2) w Reflex to ID Panel     Status: None   Collection Time: 10/03/22  7:58 AM   Specimen: BLOOD LEFT HAND  Result Value Ref Range Status   Specimen Description BLOOD LEFT HAND  Final   Special Requests   Final    BOTTLES DRAWN AEROBIC AND ANAEROBIC Blood Culture adequate volume   Culture   Final    NO GROWTH 5 DAYS Performed at [facility], [address], [place], [place] [address]    Report Status 10/08/2022 FINAL  Final  Culture, blood (Routine X 2) w Reflex to ID Panel     Status: None   Collection Time: 10/03/22  7:59 AM   Specimen: BLOOD RIGHT ARM  Result Value Ref Range Status   Specimen Description BLOOD RIGHT ARM  Final   Special Requests   Final    BOTTLES DRAWN AEROBIC AND ANAEROBIC Blood Culture adequate volume   Culture    Final    NO GROWTH 5 DAYS Performed at [facility], [address], [place], [place] [address]    Report Status 10/08/2022 FINAL  Final    Labs: CBC: Recent Labs  Lab 06/02/23 1001 06/02/23 1012  WBC 10.0  --   HGB 11.6* 11.6*  HCT 36.8 34.0*  MCV 90.4  --   PLT 511*  --    Basic Metabolic Panel: Recent Labs  Lab 06/02/23 1012 06/02/23 1350  NA 136 137  K 5.8* 4.5  CL 108 106  CO2  --  24  GLUCOSE 129* 127*  BUN 13 11  CREATININE 1.10* 1.02*  CALCIUM  --  9.0   Liver Function Tests: Recent Labs  Lab 06/02/23 1350  AST 18  ALT 11  ALKPHOS 47  BILITOT 0.4  PROT  6.5  ALBUMIN 3.1*   CBG: Recent Labs  Lab 06/03/23 1730 06/03/23 2027 06/03/23 2331 06/04/23 0349 06/04/23 1123  GLUCAP 154* 140* 146* 123* 159*    Discharge time spent: greater than 30 minutes.  Signed: Alba Cory, MD [facility] 06/04/2023

## 2023-06-04 NOTE — NC FL2 (Signed)
 [place] MEDICAID FL2 LEVEL OF CARE FORM     IDENTIFICATION  Patient Name: Anna [name] Birthdate: 08/01/1955 Sex: female Admission Date (Current Location): 06/02/2023  [facility] and [place] Number:  Producer, television/film/video and Address:  [facility], [address], [place], [place] [address]      Provider Number: [id number]  Attending Physician Name and Address:  Alba Cory, MD  Relative Name and Phone Number:       Current Level of Care: Hospital Recommended Level of Care: Skilled Nursing Facility Prior Approval Number:    Date Approved/Denied:   PASRR Number: Manual review  Discharge Plan: SNF    Current Diagnoses: Patient Active Problem List   Diagnosis Date Noted   Fall at home, initial encounter 06/02/2023   Gangrene of toe of left foot (HCC)    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) 10/06/2021   Dyslipidemia 10/06/2021   Mood disorder (HCC) 10/06/2021   Cellulitis 08/19/2021   Hyperkalemia 08/19/2021   OSA (obstructive sleep apnea) 08/19/2021   Anxiety 08/04/2021   Hyperlipidemia 08/04/2021   Thrombocytosis 08/04/2021   Status post amputation of lesser toe of left foot (HCC) 06/21/2021   PAD (peripheral artery disease) (HCC) 06/12/2021   Acute CVA (cerebrovascular accident) (HCC) 04/11/2021   Type 2 diabetes mellitus with diabetic peripheral angiopathy and gangrene, with long-term current use of insulin (HCC) 04/03/2021   Left sided numbness 03/12/2021   Spinal stenosis of lumbar region with neurogenic claudication 03/18/2019   GERD (gastroesophageal reflux disease) 12/12/2017   History of adenomatous polyp of colon 01/14/2017   Dysphagia 09/12/2016   Screening for colon cancer 09/12/2016   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   Vision loss of left eye 12/09/2015   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 11/01/2015   Pyelonephritis 06/24/2014    Diabetic neuropathy (HCC)    Essential hypertension    Type 2 diabetes mellitus (HCC)    COPD mixed type (HCC)    Acute pyelonephritis 06/23/2014    Orientation RESPIRATION BLADDER Height & Weight     Self, Time, Situation, Place  Normal Continent Weight: 154 lb 5.2 oz (70 kg) Height:  5\' 1"  (154.9 cm)  BEHAVIORAL SYMPTOMS/MOOD NEUROLOGICAL BOWEL NUTRITION STATUS      Continent Diet (heart healthy/carb modified)  AMBULATORY STATUS COMMUNICATION OF NEEDS Skin   Limited Assist Verbally Skin abrasions (face laceration, steri strips)                       Personal Care Assistance Level of Assistance  Bathing, Feeding, Dressing Bathing Assistance: Limited assistance Feeding assistance: Independent Dressing Assistance: Limited assistance     Functional Limitations Info  Sight Sight Info: Impaired (glasses)        SPECIAL CARE FACTORS FREQUENCY                       Contractures Contractures Info: Not present    Additional Factors Info  Code Status, Allergies, Psychotropic, Insulin Sliding Scale Code Status Info: Full Allergies Info: Lasix (Furosemide), Bacitracin, Citalopram, Doxycycline, Morphine And Codeine, Neomycin, Ciprofloxacin, Ciprofloxacin-dexamethasone, Duloxetine, Latex, Levaquin (Levofloxacin In D5w), Other, Salvia Officinalis, Soma (Carisoprodol) Psychotropic Info: Buspar 7.5 mg 2x/day; Prozac 40mg  daily Insulin Sliding Scale Info: see DC summary       Current Medications (06/04/2023):  This is the current hospital active medication list Current Facility-Administered Medications  Medication Dose  Route Frequency Provider Last Rate Last Admin    stroke: early stages of recovery book   Does not apply Once Gertha Calkin, MD       0.9 %  sodium chloride infusion   Intravenous Continuous Gertha Calkin, MD 20 mL/hr at 06/02/23 1656 New Bag at 06/02/23 1656   acetaminophen (TYLENOL) tablet 650 mg  650 mg Oral Q4H PRN Gertha Calkin, MD   650 mg at 06/04/23  1451   Or   acetaminophen (TYLENOL) 160 MG/5ML solution 650 mg  650 mg Per Tube Q4H PRN Gertha Calkin, MD       Or   acetaminophen (TYLENOL) suppository 650 mg  650 mg Rectal Q4H PRN Gertha Calkin, MD       aspirin EC tablet 81 mg  81 mg Oral Daily Gertha Calkin, MD   81 mg at 06/04/23 1044   atorvastatin (LIPITOR) tablet 80 mg  80 mg Oral QHS de Saintclair Halsted, Cortney E, NP   80 mg at 06/03/23 2203   benzonatate (TESSALON) capsule 200 mg  200 mg Oral TID PRN Regalado, Belkys A, MD   200 mg at 06/03/23 1201   busPIRone (BUSPAR) tablet 7.5 mg  7.5 mg Oral BID Irena Cords V, MD   7.5 mg at 06/04/23 1044   FLUoxetine (PROZAC) capsule 40 mg  40 mg Oral Daily Irena Cords V, MD   40 mg at 06/04/23 1044   heparin injection 5,000 Units  5,000 Units Subcutaneous Q12H Gertha Calkin, MD   5,000 Units at 06/04/23 1044   hydrOXYzine (ATARAX) tablet 10 mg  10 mg Oral BID PRN Regalado, Belkys A, MD   10 mg at 06/03/23 1500   insulin aspart (novoLOG) injection 0-15 Units  0-15 Units Subcutaneous Q4H Irena Cords V, MD   3 Units at 06/04/23 1316   ipratropium-albuterol (DUONEB) 0.5-2.5 (3) MG/3ML nebulizer solution 3 mL  3 mL Nebulization Q6H PRN Regalado, Belkys A, MD       ipratropium-albuterol (DUONEB) 0.5-2.5 (3) MG/3ML nebulizer solution 3 mL  3 mL Nebulization BID Regalado, Belkys A, MD   3 mL at 06/04/23 0758   pantoprazole (PROTONIX) EC tablet 40 mg  40 mg Oral Q1200 Ilda Basset, RPH   40 mg at 06/04/23 1317   rOPINIRole (REQUIP) tablet 0.25 mg  0.25 mg Oral QHS Irena Cords V, MD   0.25 mg at 06/03/23 2203   tamsulosin (FLOMAX) capsule 0.4 mg  0.4 mg Oral Daily Irena Cords V, MD   0.4 mg at 06/04/23 1044   ticagrelor (BRILINTA) tablet 90 mg  90 mg Oral BID de Saintclair Halsted, Cortney E, NP   90 mg at 06/04/23 1045   topiramate (TOPAMAX) tablet 50 mg  50 mg Oral Daily de Saintclair Halsted, Cortney E, NP   50 mg at 06/04/23 1045     Discharge Medications: Please see discharge summary for a list of discharge  medications.  Relevant Imaging Results:  Relevant Lab Results:   Additional Information SS#: [id number]  Baldemar Lenis, LCSW

## 2023-06-04 NOTE — Plan of Care (Signed)

## 2023-06-04 NOTE — TOC Progression Note (Signed)
 Transition of Care [facility]) - Progression Note    Patient Details  Name: Anna [name] MRN: [id number] Date of Birth: [birth year]  [facility] CM/SW Contact  Kermit Balo, RN Phone Number: 06/04/2023, 3:20 PM  Clinical Narrative:     [facility] denied the pt for SNF but has agreed to 1 week of respite at [facility]. LCSW has updated [facility]. They will have a bed tomorrow. CM will update the patient. TOC following.  Expected Discharge Plan: Skilled Nursing Facility Barriers to Discharge: Continued Medical Work up  Expected Discharge Plan and Services In-house Referral: Clinical Social Work Discharge Planning Services: CM Consult Post Acute Care Choice: Skilled Nursing Facility Living arrangements for the past 2 months: Single Family Home                                       Social Determinants of Health (SDOH) Interventions SDOH Screenings   Food Insecurity: Food Insecurity Present (06/03/2023)  Housing: High Risk (06/03/2023)  Transportation Needs: No Transportation Needs (06/03/2023)  Utilities: At Risk (06/03/2023)  Social Connections: Socially Isolated (06/03/2023)  Tobacco Use: Low Risk  (06/02/2023)    Readmission Risk Interventions     No data to display

## 2023-06-04 NOTE — Progress Notes (Signed)
 Initial Nutrition Assessment  DOCUMENTATION CODES:   Not applicable  INTERVENTION:  Liberalize diet to carb modified Glucerna Shake po BID, each supplement provides 220 kcal and 10 grams of protein (vanilla or strawberry if available)  NUTRITION DIAGNOSIS:   Increased nutrient needs related to acute illness as evidenced by estimated needs.  GOAL:   Patient will meet greater than or equal to 90% of their needs  MONITOR:   PO intake, Labs, Weight trends  REASON FOR ASSESSMENT:   Consult Assessment of nutrition requirement/status  ASSESSMENT:   Pt admitted after a fall, found to have subacute small R thalamic infarct and evidence of prior lacunar strokes on admission. PMH significant for CVA, diabetes, HTN, L leg BKA, COPD.  Spoke with pt at bedside. Provided therapeutic listening as pt recounts difficult social/environmental circumstances regarding housing and family complications.   Pt states that she was experiencing episodes of diarrhea for several days PTA which is now resolved and she has not had a BM since admission.   Pt reports that her appetite is much improved since being admitted. She recalls eating an omelette yesterday and today she ordered a baked and sweet potato though only consumed her sweet potato.   Pt recounts that when she lived with her niece, she was reliant on Glucerna shakes and mustard/ketchup sandwiches. Since she has been living in her own apartment, she does not consume glucerna shakes as she is eating a little better. Breakfast is her best meal which is either oatmeal or 2 pieces of toast or cereal. She tries to eat at least 2 meals per day and recalls enjoying shrimp cocktail with tarter sauce.   No documented meal completions on file to review.   Utilizes wheelchair for mobility but tries to exercise her R leg when pivoting to wheelchair or cooking in the kitchen.   There is limited weight history on file to review within the last year.  Pt's  weight does appear to have been increasing from 58.1 kg up to 69.4 kg between 09/2022-01/2023. Pt reports that her weight has increased from about 121 lbs up to about 160 lbs while she was reliant on 6 glucerna shakes per day when living with her niece.   Medications: SSI 0-15 units q4h  Labs: no updated labs on file since 3/9  CBG's 99-154 x24 hours HgbA1c 7.5%   NUTRITION - FOCUSED PHYSICAL EXAM: Flowsheet Row Most Recent Value  Orbital Region No depletion  Upper Arm Region No depletion  Thoracic and Lumbar Region No depletion  Buccal Region Mild depletion  Temple Region No depletion  Clavicle Bone Region No depletion  Clavicle and Acromion Bone Region No depletion  Scapular Bone Region No depletion  Dorsal Hand Moderate depletion  Patellar Region Severe depletion  Anterior Thigh Region Severe depletion  [L BKA]  Posterior Calf Region Moderate depletion  Edema (RD Assessment) None  Hair Reviewed  Eyes Reviewed  Mouth Reviewed  Skin Reviewed  Nails Reviewed       Diet Order:   Diet Order             Diet heart healthy/carb modified Room service appropriate? Yes; Fluid consistency: Thin  Diet effective now                   EDUCATION NEEDS:   No education needs have been identified at this time  Skin:  Skin Assessment: Reviewed RN Assessment  Last BM:  unknown/PTA  Height:   Ht Readings from Last 1  Encounters:  06/02/23 5\' 1"  (1.549 m)    Weight:   Wt Readings from Last 1 Encounters:  06/02/23 70 kg   BMI:  Body mass index is 33.1 kg/m (adjusted for L BKA)  Estimated Nutritional Needs:   Kcal:  1400-1600  Protein:  65-75g  Fluid:  >/=1.5L  Drusilla Kanner, RDN, LDN Clinical Nutrition

## 2023-06-05 ENCOUNTER — Other Ambulatory Visit (HOSPITAL_COMMUNITY): Payer: Self-pay

## 2023-06-05 DIAGNOSIS — R200 Anesthesia of skin: Secondary | ICD-10-CM | POA: Diagnosis not present

## 2023-06-05 DIAGNOSIS — W19XXXA Unspecified fall, initial encounter: Secondary | ICD-10-CM | POA: Diagnosis not present

## 2023-06-05 DIAGNOSIS — S01112A Laceration without foreign body of left eyelid and periocular area, initial encounter: Secondary | ICD-10-CM | POA: Diagnosis not present

## 2023-06-05 DIAGNOSIS — I639 Cerebral infarction, unspecified: Secondary | ICD-10-CM | POA: Diagnosis not present

## 2023-06-05 LAB — GLUCOSE, CAPILLARY
Glucose-Capillary: 150 mg/dL — ABNORMAL HIGH (ref 70–99)
Glucose-Capillary: 261 mg/dL — ABNORMAL HIGH (ref 70–99)

## 2023-06-05 MED ORDER — TOPIRAMATE 50 MG PO TABS: 50.0000 mg | ORAL_TABLET | Freq: Two times a day (BID) | ORAL | Status: DC

## 2023-06-05 NOTE — Progress Notes (Signed)
 [facility]                                                                              Anna [name], is a 68 y.o. female, DOB - May 20, 1955, WUJ:[id number] Admit date - 06/02/2023    Outpatient Primary MD for the patient is [facility], [address] [facility] [employer]  LOS - 3  days  Chief Complaint  Patient presents with   Fall       Brief summary   68 year old with past medical history significant for CVA on Plavix, diabetes, hypertension, left leg BKA, COPD, allergies to more than 14 medications, lives at home with, patient presents after a fall, she was trying to get up to transfer herself from the couch to the wheelchair, today she lost balance, hit her face. Few days ago she had some diarrhea and also noticed that her face and left arm were numb a week ago. During evaluation, MRI was positive for a small acute lacunar infarct of the central right thalamus. Neurology was consulted. Patient was admitted for further evaluation.    Assessment & Plan       Acute CVA (cerebrovascular accident) [facility]) -Patient presented with numbness and tingling of the left side of her face that is started a week ago.  Also presented after a fall -MRI showed small acute right thalamic infarct, chronic small vessel disease -Started on aspirin and Plavix by neurology -LDL 114, triglycerides 178 -CT angio neck and head:No emergent large vessel occlusion.  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 Multifocal severe left and moderate right vertebral artery stenosis. -A1c: 7.5 -ECHO: Ef 60% -Per stroke service/neurology, Dr Pearlean Brownie, started on aspirin and Brilinta for 4 weeks, then aspirin alone.  Aggressive risk factor modification, mobilize out of bed.  No need to resume plavix for stroke prevention. Per patient patient was taking plavix for stroke, not for vascular diseases.  Therapy recommend SNF Neurology recommended increase Topamax to help with numbness, increased to 50 mg  twice daily   Fall; -PT/ OT consulted -CT head, Cervical spine: No acute intracranial abnormality. Mild chronic small vessel disease. No evidence of cervical spine fracture or subluxation. Moderate degenerative disc disease at C5-6 and C6-7. Moderate left-sided facet DJD at C2-3 and C3-4. -Pelvis x-ray: No acute abnormality   Essential hypertension: -Permissive  hypertension in the setting of acute stroke. -resume lisinopril   Diabetes type 2: -A1c 7.5 SSI Resume 10 units of Toujeo. Resume metformin.    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 Otherwise no acute cardiopulmonary findings. Cough Improved.    Estimated body mass index is 29.16 kg/m as calculated from the following:   Height as of this encounter: 5\' 1"  (1.549 m).   Weight as of this encounter: 70 kg.  Code Status: Full CODE STATUS DVT Prophylaxis:  heparin injection 5,000 Units Start: 06/02/23 1530   Level of Care: Level of care: Telemetry Medical Family Communication:  Disposition Plan:      Remains inpatient appropriate: DC summary done by Dr. Lauraine Rinne on 06/04/2023, still stands.  AVS updated  to reflect Topamax increase 50mg  BID.  Cleared to discharge to SNF today.   Procedures:  2D echo  Consultants:   Neurology  Antimicrobials:   Anti-infectives (From admission, onward)    None          Medications   stroke: early stages of recovery book   Does not apply Once   aspirin EC  81 mg Oral Daily   atorvastatin  80 mg Oral QHS   busPIRone  7.5 mg Oral BID   feeding supplement (GLUCERNA SHAKE)  237 mL Oral BID BM   FLUoxetine  40 mg Oral Daily   heparin  5,000 Units Subcutaneous Q12H   insulin aspart  0-5 Units Subcutaneous QHS   insulin aspart  0-9 Units  Subcutaneous TID WC   ipratropium-albuterol  3 mL Nebulization BID   pantoprazole  40 mg Oral Q1200   rOPINIRole  0.25 mg Oral QHS   tamsulosin  0.4 mg Oral Daily   ticagrelor  90 mg Oral BID   topiramate  50 mg Oral Daily      Subjective:   Anna [name] was seen and examined today.  Still has left-sided numbness, no acute nausea vomiting, chest pain or shortness of breath.  Cleared for discharge to SNF today   Objective:   Vitals:   06/05/23 0036 06/05/23 0459 06/05/23 0733 06/05/23 0747  BP: (!) 141/73 125/65 (!) 117/58   Pulse: 79 81 82   Resp: 18  17   Temp: 98.4 F (36.9 C) 98.5 F (36.9 C) 98.7 F (37.1 C)   TempSrc: Oral Oral Oral   SpO2: 98% 97% 100% 99%  Weight:      Height:        Intake/Output Summary (Last 24 hours) at 06/05/2023 0934 Last data filed at 06/05/2023 0036 Gross per 24 hour  Intake 237 ml  Output 800 ml  Net -563 ml     Wt Readings from Last 3 Encounters:  06/02/23 70 kg  02/06/23 69.4 kg  02/01/23 69.4 kg     Exam General: Alert and oriented x 3, NAD Cardiovascular: S1 S2 auscultated,  RRR Respiratory: Clear to auscultation bilaterally Gastrointestinal: Soft, nontender, nondistended, + bowel sounds Ext: no pedal edema bilaterally, left BKA Neuro: no new deficits Psych: Normal affect     Data Reviewed:  I have personally reviewed following labs    CBC Lab Results  Component Value Date   WBC 10.0 06/02/2023   RBC 4.07 06/02/2023   HGB 11.6 (L) 06/02/2023   HCT 34.0 (L) 06/02/2023   MCV 90.4 06/02/2023   MCH 28.5 06/02/2023   PLT 511 (H) 06/02/2023   MCHC 31.5 06/02/2023   RDW 13.2 06/02/2023   LYMPHSABS 1.4 10/03/2022   MONOABS 0.9 10/03/2022   EOSABS 0.3 10/03/2022   BASOSABS 0.0 10/03/2022     Last metabolic panel Lab Results  Component Value Date   NA 137 06/02/2023   K 4.5 06/02/2023   CL 106 06/02/2023   CO2 24 06/02/2023   BUN 11 06/02/2023   CREATININE 1.02 (H) 06/02/2023   GLUCOSE 127 (H)  06/02/2023   GFRNONAA 60 (L) 06/02/2023   GFRAA >90 06/25/2014   CALCIUM 9.0 06/02/2023   PHOS 3.5 08/20/2021   PROT 6.5 06/02/2023   ALBUMIN 3.1 (L) 06/02/2023   BILITOT 0.4 06/02/2023   ALKPHOS 47 06/02/2023  AST 18 06/02/2023   ALT 11 06/02/2023   ANIONGAP 7 06/02/2023    CBG (last 3)  Recent Labs    06/04/23 1613 06/04/23 2117 06/05/23 0628  GLUCAP 126* 177* 150*      Coagulation Profile: Recent Labs  Lab 06/02/23 1001  INR 0.9     Radiology Studies: I have personally reviewed the imaging studies  ECHOCARDIOGRAM COMPLETE BUBBLE STUDY Result Date: 06/03/2023    ECHOCARDIOGRAM REPORT   Patient Name:   JOHARI BENNETTS Mittelstaedt Date of Exam: 06/03/2023 Medical Rec #:  [id number]        Height:       61.0 in Accession #:    [id number]       Weight:       154.3 lb Date of Birth:  02/15/1956       BSA:          1.692 m Patient Age:    67 years         BP:           133/70 mmHg Patient Gender: F                HR:           85 bpm. Exam Location:  Inpatient Procedure: 2D Echo, Cardiac Doppler, Color Doppler and Saline Contrast Bubble            Study (Both Spectral and Color Flow Doppler were utilized during            procedure). Indications:    Stroke  History:        Patient has prior history of Echocardiogram examinations, most                 recent 08/25/2021. Risk Factors:Hypertension, Diabetes,                 Dyslipidemia and Sleep Apnea.  Sonographer:    Karma Ganja Referring Phys: [phone] EKTA V PATEL  Sonographer Comments: Technically challenging study due to limited acoustic windows. Image acquisition challenging due to patient body habitus. IMPRESSIONS  1. Left ventricular ejection fraction, by estimation, is 60 to 65%. The left ventricle has normal function. The left ventricle has no regional wall motion abnormalities. Left ventricular diastolic parameters were normal.  2. Right ventricular systolic function is normal. The right ventricular size is normal.  3. The mitral valve is  normal in structure. Trivial mitral valve regurgitation. No evidence of mitral stenosis.  4. The aortic valve has an indeterminant number of cusps. Aortic valve regurgitation is not visualized. No aortic stenosis is present.  5. The inferior vena cava is normal in size with greater than 50% respiratory variability, suggesting right atrial pressure of 3 mmHg.  6. Agitated saline contrast bubble study was negative, with no evidence of any interatrial shunt. FINDINGS  Left Ventricle: Left ventricular ejection fraction, by estimation, is 60 to 65%. The left ventricle has normal function. The left ventricle has no regional wall motion abnormalities. The left ventricular internal cavity size was normal in size. There is  no left ventricular hypertrophy. Left ventricular diastolic parameters were normal. Right Ventricle: The right ventricular size is normal. No increase in right ventricular wall thickness. Right ventricular systolic function is normal. Left Atrium: Left atrial size was normal in size. Right Atrium: Right atrial size was normal in size. Pericardium: There is no evidence of pericardial effusion. Mitral Valve: The mitral valve is normal in structure. Trivial mitral valve regurgitation. No evidence  of mitral valve stenosis. MV peak gradient, 9.5 mmHg. The mean mitral valve gradient is 4.0 mmHg. Tricuspid Valve: The tricuspid valve is normal in structure. Tricuspid valve regurgitation is not demonstrated. No evidence of tricuspid stenosis. Aortic Valve: The aortic valve has an indeterminant number of cusps. Aortic valve regurgitation is not visualized. No aortic stenosis is present. Aortic valve mean gradient measures 4.0 mmHg. Aortic valve peak gradient measures 8.3 mmHg. Aortic valve area, by VTI measures 2.00 cm. Pulmonic Valve: The pulmonic valve was normal in structure. Pulmonic valve regurgitation is not visualized. No evidence of pulmonic stenosis. Aorta: The aortic root is normal in size and structure.  Venous: The inferior vena cava is normal in size with greater than 50% respiratory variability, suggesting right atrial pressure of 3 mmHg. IAS/Shunts: No atrial level shunt detected by color flow Doppler. Agitated saline contrast was given intravenously to evaluate for intracardiac shunting. Agitated saline contrast bubble study was negative, with no evidence of any interatrial shunt.  LEFT VENTRICLE PLAX 2D LVIDd:         4.40 cm   Diastology LVIDs:         3.00 cm   LV e' medial:    7.93 cm/s LV PW:         0.80 cm   LV E/e' medial:  13.1 LV IVS:        0.80 cm   LV e' lateral:   12.90 cm/s LVOT diam:     2.00 cm   LV E/e' lateral: 8.1 LV SV:         55 LV SV Index:   32 LVOT Area:     3.14 cm  RIGHT VENTRICLE             IVC RV Basal diam:  3.10 cm     IVC diam: 1.20 cm RV S prime:     11.20 cm/s TAPSE (M-mode): 1.7 cm LEFT ATRIUM             Index        RIGHT ATRIUM           Index LA diam:        3.00 cm 1.77 cm/m   RA Area:     10.90 cm LA Vol (A2C):   46.6 ml 27.55 ml/m  RA Volume:   20.30 ml  12.00 ml/m LA Vol (A4C):   37.2 ml 21.99 ml/m LA Biplane Vol: 42.4 ml 25.06 ml/m  AORTIC VALVE AV Area (Vmax):    2.02 cm AV Area (Vmean):   1.75 cm AV Area (VTI):     2.00 cm AV Vmax:           144.00 cm/s AV Vmean:          95.000 cm/s AV VTI:            0.273 m AV Peak Grad:      8.3 mmHg AV Mean Grad:      4.0 mmHg LVOT Vmax:         92.50 cm/s LVOT Vmean:        52.900 cm/s LVOT VTI:          0.174 m LVOT/AV VTI ratio: 0.64  AORTA Ao Root diam: 2.70 cm MITRAL VALVE MV Area (PHT): 3.53 cm     SHUNTS MV Area VTI:   1.53 cm     Systemic VTI:  0.17 m MV Peak grad:  9.5 mmHg     Systemic Diam: 2.00  cm MV Mean grad:  4.0 mmHg MV Vmax:       1.54 m/s MV Vmean:      98.5 cm/s MV Decel Time: 215 msec MV E velocity: 104.00 cm/s MV A velocity: 119.00 cm/s MV E/A ratio:  0.87 Aditya Sabharwal Electronically signed by Dorthula Nettles Signature Date/Time: 06/03/2023/11:14:02 AM    Final        Thad Ranger  M.D. [facility] 06/05/2023, 9:34 AM  Available via Epic secure chat 7am-7pm After 7 pm, please refer to night coverage provider listed on amion.

## 2023-06-05 NOTE — Care Management Important Message (Signed)
 Important Message  Patient Details  Name: Anna [name] MRN: [id number] Date of Birth: 08-22-1955   Important Message Given:  Yes - Medicare IM     [name] 06/05/2023, 3:13 PM

## 2023-06-05 NOTE — Plan of Care (Signed)
  Problem: Ischemic Stroke/TIA Tissue Perfusion: Goal: Complications of ischemic stroke/TIA will be minimized Outcome: Not Progressing   Problem: Self-Care: Goal: Ability to participate in self-care as condition permits will improve Outcome: Not Progressing Goal: Verbalization of feelings and concerns over difficulty with self-care will improve Outcome: Not Progressing Goal: Ability to communicate needs accurately will improve Outcome: Not Progressing   Problem: Coping: Goal: Ability to adjust to condition or change in health will improve Outcome: Not Progressing   Problem: Tissue Perfusion: Goal: Adequacy of tissue perfusion will improve Outcome: Not Progressing   Problem: Clinical Measurements: Goal: Cardiovascular complication will be avoided Outcome: Not Progressing   Problem: Safety: Goal: Ability to remain free from injury will improve Outcome: Not Progressing

## 2023-06-05 NOTE — Progress Notes (Signed)
 RN called report to [employer] [facility] [place] and answered all questions. RN made Kristi RN aware that [facility] has been notified. NT is getting patient dressed for transport.

## 2023-06-05 NOTE — Progress Notes (Addendum)
 Anesthesia Review:CVA see MRI 06-02-23, OSA, DM2, HTN,COPD , CVA  PCP:  Cardiologist : Patricia Nettle, PAC LOV 11/05/22   Chest x-ray : 06-02-23 epic EKG : 06-02-23 epic Echo : 06-03-23 epic Peripheral Vasc Cath- 08/22/21  Stress test: Cardiac Cath :  MRI - Small acute lacunar infarct of central right thalamus  06-02-23 epic  Activity level:   Sleep Study/ CPAP :  Fasting Blood Sugar :      / Checks Blood Sugar -- times a day:    Blood Thinner/ Instructions /Last Dose:Brilinta  ASA / Instructions/ Last Dose :  81 mg aspirin     DM- type  Hgba1c-  Metformin0 none day of usrgery  Toujeo- hs-  Dulaglutide- Saturdays  Novolog- tid-    02/06/23- Cysto  Latex Allergy

## 2023-06-05 NOTE — Progress Notes (Signed)
 RN attempted to call report. RN spoke to Diplomatic Services operational officer, unable to leave voicemail to RN for call back. RN notified CSW. RN to reattempt in 15 mins.

## 2023-06-05 NOTE — Progress Notes (Signed)
 Physical Therapy Treatment Patient Details Name: Anna [name] MRN: [id number] DOB: 03-29-1955 Today's Date: 06/05/2023   History of Present Illness Patient is 68 y.o. female who presents to [facility] via EMS after a fall hitting her head while attempted to transfer couch to [facility]. Pt also reports history of 2 weeks of numbness in Lt face, UE, and LE. MRI revealed small lacunar infarct in the right thalamus. PMH significant for HTN, HLD, DM, prior CVA, anxiety and depression, GERD, COPD, CKD, diabetic retinopathy, diabetic neuropathy, migraines, Left BKA, WC user at baseline.    PT Comments  Pt is slowly progressing towards goals. Currently pt is Supervision for sit to stand with RW and CGA for transfers with hop to step pattern utilizing RW. Due to pt current functional status, home set up and available assistance at home recommending skilled physical therapy services < 3 hours/day in order to address strength, balance and functional mobility to decrease risk for falls, injury, immobility, skin break down and re-hospitalization.      If plan is discharge home, recommend the following: A lot of help with walking and/or transfers;A lot of help with bathing/dressing/bathroom;Assistance with cooking/housework;Assist for transportation;Help with stairs or ramp for entrance   Can travel by private vehicle     Yes  Equipment Recommendations  Wheelchair cushion (measurements PT);Wheelchair (measurements PT)       Precautions / Restrictions Precautions Precautions: Fall Recall of Precautions/Restrictions: Intact Restrictions Weight Bearing Restrictions Per Provider Order: No     Mobility  Bed Mobility Overal bed mobility: Needs Assistance Bed Mobility: Supine to Sit     Supine to sit: Supervision, Used rails, HOB elevated     General bed mobility comments: HOB slightly elevated, use of bed features to sit up.    Transfers Overall transfer level: Needs assistance Equipment used: Rolling  walker (2 wheels) Transfers: Bed to chair/wheelchair/BSC Sit to Stand: Supervision   Step pivot transfers: Contact guard assist       General transfer comment: CGA for safety, Supervision with verbal cues for safe hand placement for sit to stand. 2x sit to stand during session with 1x cues.    Ambulation/Gait             Pre-gait activities: Pt was able to use RW for hop to step pattern with RW from EOB to recliner taking ~6 steps with a turn and CGA for safety.         Balance Overall balance assessment: Needs assistance Sitting-balance support: Feet supported Sitting balance-Leahy Scale: Good     Standing balance support: Bilateral upper extremity supported, Reliant on assistive device for balance Standing balance-Leahy Scale: Fair           Cognition Arousal: Alert Behavior During Therapy: WFL for tasks assessed/performed   PT - Cognitive impairments: No apparent impairments       Following commands: Intact            General Comments General comments (skin integrity, edema, etc.): Pt is reporting cramping in the bil LE with L>R pt performed stretching and mini SL Squats on the L which she stated improved pain prior to sitting in recliner.      Pertinent Vitals/Pain Pain Assessment Pain Assessment: Faces Faces Pain Scale: Hurts a little bit Pain Location: bil LE with L worse than R Pain Descriptors / Indicators: Aching, Cramping Pain Intervention(s): Monitored during session, Limited activity within patient's tolerance, Repositioned     PT Goals (current goals can now be found in  the care plan section) Acute Rehab PT Goals Patient Stated Goal: get stronger and independent and back home PT Goal Formulation: With patient Time For Goal Achievement: 06/17/23 Potential to Achieve Goals: Good Progress towards PT goals: Progressing toward goals    Frequency    Min 1X/week      PT Plan  Continue with current POC        AM-PAC PT "6  Clicks" Mobility   Outcome Measure  Help needed turning from your back to your side while in a flat bed without using bedrails?: A Little Help needed moving from lying on your back to sitting on the side of a flat bed without using bedrails?: A Little Help needed moving to and from a bed to a chair (including a wheelchair)?: A Little Help needed standing up from a chair using your arms (e.g., wheelchair or bedside chair)?: A Little Help needed to walk in hospital room?: A Lot Help needed climbing 3-5 steps with a railing? : Total 6 Click Score: 15    End of Session Equipment Utilized During Treatment: Gait belt Activity Tolerance: Patient tolerated treatment well Patient left: in chair;with call bell/phone within reach;with chair alarm set Nurse Communication: Mobility status PT Visit Diagnosis: Other abnormalities of gait and mobility (R26.89);Muscle weakness (generalized) (M62.81);Difficulty in walking, not elsewhere classified (R26.2);Other symptoms and signs involving the nervous system (R29.898)     Time: 0953-1010 PT Time Calculation (min) (ACUTE ONLY): 17 min  Charges:    $Therapeutic Activity: 8-22 mins PT General Charges $$ ACUTE PT VISIT: 1 Visit                     Anna [name], DPT, CLT  Acute Rehabilitation Services Office: [phone] (Secure chat preferred)    Anna [name] 06/05/2023, 11:13 AM

## 2023-06-05 NOTE — Discharge Summary (Signed)
 Physician Discharge Summary   Patient: Anna [name] MRN: [id number] DOB: April 15, 1955  Admit date:     06/02/2023  Discharge date: 06/05/23  Discharge Physician: Thad Ranger, MD    PCP: [facility]   Recommendations at discharge:   Needs PT/OT for rehab Needs aspirin and Brillinta for 4 weeks, then aspirin alone.  Topamax increase to 50 mg twice daily  Discharge Diagnoses: :   Fall at home, initial encounter   Acute CVA (cerebrovascular accident) [facility])   Essential hypertension   Hyperkalemia   Type 2 diabetes mellitus (HCC)   OSA (obstructive sleep apnea)   GERD (gastroesophageal reflux disease)    Hospital Course:  68 year old with past medical history significant for CVA on Plavix, diabetes, hypertension, left leg BKA, COPD, allergies to more than 14 medications, lives at home with, patient presents after a fall, she was trying to get up to transfer herself from the couch to the wheelchair, today she lost balance, hit her face. Few days ago she had some diarrhea and also noticed that her face and left arm were numb a week ago. During evaluation, MRI was positive for a small acute lacunar infarct of the central right thalamus. Neurology was consulted. Patient was admitted for further evaluation.   Assessment and Plan:  Acute CVA (cerebrovascular accident) [facility]) -Patient presented with numbness and tingling of the left side of her face that is started a week ago.  Also presented after a fall -MRI showed small acute right thalamic infarct, chronic small vessel disease -Started on aspirin and Plavix by neurology -LDL 114, triglycerides 178 -CT angio neck and head:No emergent large vessel occlusion.  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 Multifocal severe left and moderate right vertebral artery stenosis. -A1c: 7.5 -ECHO: EF 60 to 65%, no interatrial shunt, normal left atrial size -Per stroke service/neurology, Dr Pearlean Brownie, started on  aspirin and Brilinta for 4 weeks, then aspirin alone.  Aggressive risk factor modification, mobilize out of bed.  No need to resume plavix for stroke prevention. Per patient, she was taking plavix for stroke, not for vascular disease.  Therapy recommend SNF Neurology recommended increase Topamax to help with numbness, increased to 50 mg twice daily   Fall; -PT/ OT consulted -CT head, Cervical spine: No acute intracranial abnormality. Mild chronic small vessel disease. No evidence of cervical spine fracture or subluxation. Moderate degenerative disc disease at C5-6 and C6-7. Moderate left-sided facet DJD at C2-3 and C3-4. -Pelvis x-ray: No acute abnormality   Essential hypertension: -Permissive  hypertension in the setting of acute stroke. -resume lisinopril   Diabetes type 2: -A1c 7.5 SSI Resume 10 units of Toujeo. Resume metformin.    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 Otherwise no acute cardiopulmonary findings. Cough Improved.      Estimated body mass index is 29.16 kg/m as calculated from the following:   Height as of this encounter: 5\' 1"  (1.549 m).   Weight as of this encounter: 70 kg.       Pain control - [employer] Controlled Substance Reporting System database was reviewed. and patient was instructed, not to drive, operate heavy machinery, perform activities at heights, swimming or participation in water activities or provide baby-sitting services while on Pain, Sleep and Anxiety  Medications; until their outpatient Physician has advised to do so again. Also recommended to not to take more than prescribed Pain, Sleep and Anxiety Medications.  Consultants: Neurology Procedures performed: 2D echo Disposition: Skilled nursing  facility Diet recommendation: Carb modified diet  DISCHARGE MEDICATION: Allergies as of 06/05/2023       Reactions   Lasix [furosemide] Shortness Of Breath, Rash   Bacitracin    Burns skin   Citalopram Diarrhea   Doxycycline Other (See Comments)   "burning" all over body   Morphine And Codeine Hives   Neomycin    Used for pink eye, may pink eye worse     Ciprofloxacin    Tongue swells   Ciprofloxacin-dexamethasone Other (See Comments)   Tongue swelling    Duloxetine    Mental Status Changes (intolerance) "saw pictures of gun"   Latex Itching   Levaquin [levofloxacin In D5w] Other (See Comments)   Pt does not remember reaction but states she's had issues with med   Other    White pepper- uncontrollable sneezing    Salvia Officinalis    Sage- sneezing   Soma [carisoprodol]    Sleepy and constipation        Medication List     STOP taking these medications    clopidogrel 75 MG tablet Commonly known as: PLAVIX   ibuprofen 200 MG tablet Commonly known as: ADVIL   naproxen sodium 220 MG tablet Commonly known as: ALEVE       TAKE these medications    acetaminophen 500 MG tablet Commonly known as: TYLENOL Take 1,000-1,500 mg by mouth every 8 (eight) hours as needed for moderate pain (pain score 4-6).   albuterol 108 (90 Base) MCG/ACT inhaler Commonly known as: VENTOLIN HFA Inhale 1-2 puffs into the lungs every 6 (six) hours as needed for wheezing or shortness of breath.   aspirin EC 81 MG tablet Take 81 mg by mouth daily. Swallow whole.   atorvastatin 80 MG tablet Commonly known as: LIPITOR Take 1 tablet (80 mg total) by mouth at bedtime. What changed:  medication strength how much to take   benzonatate 100 MG capsule Commonly known as: TESSALON Take 100 mg by mouth 3 (three) times daily as needed for cough.   BIOFREEZE EX Apply 1 Application topically daily as needed (neck and shoulder pain).   busPIRone 7.5 MG tablet Commonly known as:  BUSPAR Take 7.5 mg by mouth 2 (two) times daily.   Culturelle Health & Wellness Caps Take 1 capsule by mouth daily.   diclofenac Sodium 1 % Gel Commonly known as: VOLTAREN Apply 4 g topically as needed (joint pain, phantom joint pain).   Dulaglutide 3 MG/0.5ML Soaj Inject 3 mg into the skin every Saturday.   estradiol 0.1 MG/GM vaginal cream Commonly known as: ESTRACE Place 1 Applicatorful vaginally as needed (dryness / irritation).   FLUoxetine 40 MG capsule Commonly known as: PROZAC Take 40 mg by mouth daily.   fluticasone 50 MCG/ACT nasal spray Commonly known as: FLONASE Place 1 spray into both nostrils daily as needed for allergies or rhinitis.   gabapentin 400 MG capsule Commonly known as: NEURONTIN Take 400 mg by mouth 3 (three) times daily.   GOODY HEADACHE PO Take 2 packets by mouth daily as needed (headaches).   hydrOXYzine 10 MG tablet Commonly known as: ATARAX Take 1 tablet (10 mg total) by mouth 2 (two) times daily. What changed: when to take this   insulin aspart 100 UNIT/ML FlexPen Commonly known as: NOVOLOG  Use as directed before each meal 3 times a day: 140-199 - 2 units, 200-250 - 4 units, 251-299 - 6 units,  300-349 - 8 units,  350 or above 10 units. What changed:  how much to take how to take this when to take this reasons to take this additional instructions   ipratropium-albuterol 0.5-2.5 (3) MG/3ML Soln Commonly known as: DUONEB Use 3 ml by nebulization twice a day scheduled and every 4 hours as needed for shortness of breath and wheezing What changed:  how much to take how to take this when to take this reasons to take this   lidocaine 4 % Place 1 patch onto the skin daily as needed (pain).   lisinopril 5 MG tablet Commonly known as: ZESTRIL Take 5 mg by mouth at bedtime.   loratadine 10 MG tablet Commonly known as: CLARITIN Take 10 mg by mouth daily as needed for allergies.   meclizine 25 MG tablet Commonly known as:  ANTIVERT Take 25 mg by mouth daily as needed for dizziness.   metFORMIN 1000 MG tablet Commonly known as: GLUCOPHAGE Take 1,000 mg by mouth 2 (two) times daily with a meal.   nystatin 100000 UNIT/ML suspension Commonly known as: MYCOSTATIN Take 1 mL by mouth 4 (four) times daily as needed (red gums).   nystatin cream Commonly known as: MYCOSTATIN Apply 1 application. topically 4 (four) times daily as needed for dry skin.   ondansetron 4 MG disintegrating tablet Commonly known as: ZOFRAN-ODT Take 4 mg by mouth every 8 (eight) hours as needed for nausea or vomiting.   pantoprazole 40 MG tablet Commonly known as: PROTONIX Take 40 mg by mouth 2 (two) times daily.   Prevagen 10 MG Caps Generic drug: Apoaequorin Take 10 mg by mouth daily.   rOPINIRole 0.25 MG tablet Commonly known as: REQUIP Take 0.25 mg by mouth at bedtime.   sennosides-docusate sodium 8.6-50 MG tablet Commonly known as: SENOKOT-S Take 1 tablet by mouth 2 (two) times daily as needed for constipation.   tamsulosin 0.4 MG Caps capsule Commonly known as: FLOMAX Take 1 capsule (0.4 mg total) by mouth daily.   ticagrelor 90 MG Tabs tablet Commonly known as: BRILINTA Take 1 tablet (90 mg total) by mouth 2 (two) times daily.   topiramate 50 MG tablet Commonly known as: TOPAMAX Take 1 tablet (50 mg total) by mouth 2 (two) times daily. What changed:  medication strength how much to take when to take this   Toujeo SoloStar 300 UNIT/ML Solostar Pen Generic drug: insulin glargine (1 Unit Dial) Inject 10 Units into the skin at bedtime. What changed: how much to take        Follow-up Information     [facility] Schedule an appointment as soon as possible for a visit in 2 week(s).   Why: for hospital follow-up Contact information: [address] [place] [address] [phone]         Micki Riley, MD Follow up in 4 week(s).   Specialties: Neurology,  Radiology Why: for hospital follow-up Contact information: [address] [place] [address] [phone]                Discharge Exam: Ceasar Mons Weights   06/02/23 0954  Weight: 70 kg   Physical Exam General: Alert and oriented x 3, NAD Cardiovascular: S1 S2 clear, RRR.  Respiratory: CTAB, no wheezing, rales or rhonchi Gastrointestinal: Soft, nontender, nondistended, NBS Ext: no pedal edema bilaterally,  left BKA Neuro: no new deficits Psych: Normal affect    Condition at discharge: fair  The results of significant diagnostics from this hospitalization (including imaging, microbiology, ancillary and laboratory) are listed below for reference.   Imaging Studies: ECHOCARDIOGRAM COMPLETE BUBBLE STUDY Result Date: 06/03/2023    ECHOCARDIOGRAM REPORT   Patient Name:   Anna [name] Date of Exam: 06/03/2023 Medical Rec #:  [id number]        Height:       61.0 in Accession #:    [id number]       Weight:       154.3 lb Date of Birth:  1956-03-12       BSA:          1.692 m Patient Age:    67 years         BP:           133/70 mmHg Patient Gender: F                HR:           85 bpm. Exam Location:  Inpatient Procedure: 2D Echo, Cardiac Doppler, Color Doppler and Saline Contrast Bubble            Study (Both Spectral and Color Flow Doppler were utilized during            procedure). Indications:    Stroke  History:        Patient has prior history of Echocardiogram examinations, most                 recent 08/25/2021. Risk Factors:Hypertension, Diabetes,                 Dyslipidemia and Sleep Apnea.  Sonographer:    Karma Ganja Referring Phys: [phone] EKTA V PATEL  Sonographer Comments: Technically challenging study due to limited acoustic windows. Image acquisition challenging due to patient body habitus. IMPRESSIONS  1. Left ventricular ejection fraction, by estimation, is 60 to 65%. The left ventricle has normal function. The left ventricle has no regional wall motion  abnormalities. Left ventricular diastolic parameters were normal.  2. Right ventricular systolic function is normal. The right ventricular size is normal.  3. The mitral valve is normal in structure. Trivial mitral valve regurgitation. No evidence of mitral stenosis.  4. The aortic valve has an indeterminant number of cusps. Aortic valve regurgitation is not visualized. No aortic stenosis is present.  5. The inferior vena cava is normal in size with greater than 50% respiratory variability, suggesting right atrial pressure of 3 mmHg.  6. Agitated saline contrast bubble study was negative, with no evidence of any interatrial shunt. FINDINGS  Left Ventricle: Left ventricular ejection fraction, by estimation, is 60 to 65%. The left ventricle has normal function. The left ventricle has no regional wall motion abnormalities. The left ventricular internal cavity size was normal in size. There is  no left ventricular hypertrophy. Left ventricular diastolic parameters were normal. Right Ventricle: The right ventricular size is normal. No increase in right ventricular wall thickness. Right ventricular systolic function is normal. Left Atrium: Left atrial size was normal in size. Right Atrium: Right atrial size was normal in size. Pericardium: There is no evidence of pericardial effusion. Mitral Valve: The mitral valve is normal in structure. Trivial mitral valve regurgitation. No evidence of mitral valve stenosis. MV peak gradient, 9.5 mmHg. The mean mitral valve gradient is 4.0 mmHg. Tricuspid Valve: The tricuspid valve is normal in structure.  Tricuspid valve regurgitation is not demonstrated. No evidence of tricuspid stenosis. Aortic Valve: The aortic valve has an indeterminant number of cusps. Aortic valve regurgitation is not visualized. No aortic stenosis is present. Aortic valve mean gradient measures 4.0 mmHg. Aortic valve peak gradient measures 8.3 mmHg. Aortic valve area, by VTI measures 2.00 cm. Pulmonic Valve:  The pulmonic valve was normal in structure. Pulmonic valve regurgitation is not visualized. No evidence of pulmonic stenosis. Aorta: The aortic root is normal in size and structure. Venous: The inferior vena cava is normal in size with greater than 50% respiratory variability, suggesting right atrial pressure of 3 mmHg. IAS/Shunts: No atrial level shunt detected by color flow Doppler. Agitated saline contrast was given intravenously to evaluate for intracardiac shunting. Agitated saline contrast bubble study was negative, with no evidence of any interatrial shunt.  LEFT VENTRICLE PLAX 2D LVIDd:         4.40 cm   Diastology LVIDs:         3.00 cm   LV e' medial:    7.93 cm/s LV PW:         0.80 cm   LV E/e' medial:  13.1 LV IVS:        0.80 cm   LV e' lateral:   12.90 cm/s LVOT diam:     2.00 cm   LV E/e' lateral: 8.1 LV SV:         55 LV SV Index:   32 LVOT Area:     3.14 cm  RIGHT VENTRICLE             IVC RV Basal diam:  3.10 cm     IVC diam: 1.20 cm RV S prime:     11.20 cm/s TAPSE (M-mode): 1.7 cm LEFT ATRIUM             Index        RIGHT ATRIUM           Index LA diam:        3.00 cm 1.77 cm/m   RA Area:     10.90 cm LA Vol (A2C):   46.6 ml 27.55 ml/m  RA Volume:   20.30 ml  12.00 ml/m LA Vol (A4C):   37.2 ml 21.99 ml/m LA Biplane Vol: 42.4 ml 25.06 ml/m  AORTIC VALVE AV Area (Vmax):    2.02 cm AV Area (Vmean):   1.75 cm AV Area (VTI):     2.00 cm AV Vmax:           144.00 cm/s AV Vmean:          95.000 cm/s AV VTI:            0.273 m AV Peak Grad:      8.3 mmHg AV Mean Grad:      4.0 mmHg LVOT Vmax:         92.50 cm/s LVOT Vmean:        52.900 cm/s LVOT VTI:          0.174 m LVOT/AV VTI ratio: 0.64  AORTA Ao Root diam: 2.70 cm MITRAL VALVE MV Area (PHT): 3.53 cm     SHUNTS MV Area VTI:   1.53 cm     Systemic VTI:  0.17 m MV Peak grad:  9.5 mmHg     Systemic Diam: 2.00 cm MV Mean grad:  4.0 mmHg MV Vmax:       1.54 m/s MV Vmean:      98.5 cm/s  MV Decel Time: 215 msec MV E velocity: 104.00 cm/s MV A  velocity: 119.00 cm/s MV E/A ratio:  0.87 Aditya Sabharwal Electronically signed by Dorthula Nettles Signature Date/Time: 06/03/2023/11:14:02 AM    Final    CT ANGIO HEAD NECK W WO CM Result Date: 06/02/2023 CLINICAL DATA:  Stroke/TIA, determine embolic source EXAM: CT ANGIOGRAPHY HEAD AND NECK WITH AND WITHOUT CONTRAST TECHNIQUE: Multidetector CT imaging of the head and neck was performed using the standard protocol during bolus administration of intravenous contrast. Multiplanar CT image reconstructions and MIPs were obtained to evaluate the vascular anatomy. Carotid stenosis measurements (when applicable) are obtained utilizing NASCET criteria, using the distal internal carotid diameter as the denominator. RADIATION DOSE REDUCTION: This exam was performed according to the departmental dose-optimization program which includes automated exposure control, adjustment of the mA and/or kV according to patient size and/or use of iterative reconstruction technique. CONTRAST:  75mL OMNIPAQUE IOHEXOL 350 MG/ML SOLN COMPARISON:  CT head March 9, 25. FINDINGS: CTA NECK FINDINGS Aortic arch: Aortic atherosclerosis. Great vessel origins are patent without significant stenosis. Right carotid system: Atherosclerosis at the carotid bifurcation without greater than 50% stenosis. Left carotid system: Atherosclerosis at the carotid bifurcation without greater than 50% stenosis. Vertebral arteries: Moderate stenosis of the right vertebral artery at its origin and at C4 and C5 due to mass effect from adjacent osteophytes. Left vertebral artery is patent with severe origin stenosis and severe foraminal stenosis at C5-C6 due to mass effect from osteophytes. Skeleton: Severe multilevel degenerative change including posterior disc osteophyte complexes and facet/uncovertebral hypertrophy resulting in multilevel foraminal stenosis. Other neck: No acute abnormality on limited assessment. Upper chest: Visualized lung apices are clear. Review  of the MIP images confirms the above findings CTA HEAD FINDINGS Anterior circulation: Bilateral intracranial ICAs, MCAs, and ACAs are patent without proximal hemodynamically significant stenosis. Posterior circulation: Bilateral intradural vertebral arteries are patent. Severe proximal left intradural vertebral artery stenosis. Basilar artery and bilateral posterior cerebral arteries are patent. 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 Venous sinuses: As permitted by contrast timing, patent. Review of the MIP images confirms the above findings IMPRESSION: 1. No emergent large vessel occlusion. 2. 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 3. 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 4.  Aortic Atherosclerosis (ICD10-I70.0). Electronically Signed   By: Feliberto Harts M.D.   On: 06/02/2023 20:01   MR Brain Wo Contrast (neuro protocol) Result Date: 06/02/2023 CLINICAL DATA:  68 year old female neurologic deficit. Fall. Blunt plate trauma. Head and neck pain. EXAM: MRI HEAD WITHOUT CONTRAST TECHNIQUE: Multiplanar, multiecho pulse sequences of the brain and surrounding structures were obtained without intravenous contrast. COMPARISON:  CT head and cervical spine 0928 hours today. Brain MRI 08/26/2021. FINDINGS: Brain: Small subcentimeter, 4-5 mm slightly subtle focus of restricted diffusion in the central thalamus (series 5, image 72 and series 6, image 24). No other diffusion restriction. Mild T2 and FLAIR hyperintensity there, superimposed on nearby chronic lacunar infarct of the posterior right internal capsule which was present in 2023. Contralateral chronic left lacunar infarct is stable. Other scattered bilateral cerebral white matter and occasionally cortical (series 11, image 29), insular (series 11, image 28) T2 and FLAIR hyperintensity is stable since 2023. moderate patchy T2 heterogeneity in the pons is stable. No chronic cerebral blood products.  Negative cerebellum for age. No midline shift, mass effect, evidence of mass lesion, ventriculomegaly, extra-axial collection or acute intracranial hemorrhage. Cervicomedullary junction and pituitary are within normal limits.  Vascular: Major intracranial vascular flow voids are stable. Skull and upper cervical spine: Negative for age visible cervical spine. Visualized bone marrow signal is within normal limits. Sinuses/Orbits: Stable, negative. Other: Negative visible scalp and face. IMPRESSION: 1. Positive for small Acute lacunar infarct of the central right thalamus. No hemorrhage or mass effect. 2. Underlying advanced chronic small vessel disease otherwise not significantly changed since 2023. Electronically Signed   By: Odessa Fleming M.D.   On: 06/02/2023 12:36   CT HEAD WO CONTRAST Result Date: 06/02/2023 CLINICAL DATA:  Fall.  Blunt plate trauma.  Head and neck pain. EXAM: CT HEAD WITHOUT CONTRAST CT CERVICAL SPINE WITHOUT CONTRAST TECHNIQUE: Multidetector CT imaging of the head and cervical spine was performed following the standard protocol without intravenous contrast. Multiplanar CT image reconstructions of the cervical spine were also generated. RADIATION DOSE REDUCTION: This exam was performed according to the departmental dose-optimization program which includes automated exposure control, adjustment of the mA and/or kV according to patient size and/or use of iterative reconstruction technique. COMPARISON:  11/23/2012 FINDINGS: CT HEAD FINDINGS Brain: No evidence of intracranial hemorrhage, acute infarction, hydrocephalus, extra-axial collection, or mass lesion/mass effect. Mild chronic small vessel disease shows progression since 2014. Vascular:  No hyperdense vessel or other acute findings. Skull: No evidence of fracture or other significant bone abnormality. Sinuses/Orbits:  No acute findings. Other: None. CT CERVICAL SPINE FINDINGS Alignment: Normal. Skull base and vertebrae: No acute fracture. No  primary bone lesion or focal pathologic process. Soft tissues and spinal canal: No prevertebral fluid or swelling. No visible canal hematoma. Disc levels: Moderate degenerative disc disease again seen at C5-6 and C6-7, with prominent osteophytosis. Moderate left-sided facet DJD is seen at C2-3 and C3-4, increased since prior exam. Upper chest: No acute findings. Other: None. IMPRESSION: No acute intracranial abnormality. Mild chronic small vessel disease. No evidence of cervical spine fracture or subluxation. Moderate degenerative disc disease at C5-6 and C6-7. Moderate left-sided facet DJD at C2-3 and C3-4. Electronically Signed   By: Danae Orleans M.D.   On: 06/02/2023 10:48   CT CERVICAL SPINE WO CONTRAST Result Date: 06/02/2023 CLINICAL DATA:  Fall.  Blunt plate trauma.  Head and neck pain. EXAM: CT HEAD WITHOUT CONTRAST CT CERVICAL SPINE WITHOUT CONTRAST TECHNIQUE: Multidetector CT imaging of the head and cervical spine was performed following the standard protocol without intravenous contrast. Multiplanar CT image reconstructions of the cervical spine were also generated. RADIATION DOSE REDUCTION: This exam was performed according to the departmental dose-optimization program which includes automated exposure control, adjustment of the mA and/or kV according to patient size and/or use of iterative reconstruction technique. COMPARISON:  11/23/2012 FINDINGS: CT HEAD FINDINGS Brain: No evidence of intracranial hemorrhage, acute infarction, hydrocephalus, extra-axial collection, or mass lesion/mass effect. Mild chronic small vessel disease shows progression since 2014. Vascular:  No hyperdense vessel or other acute findings. Skull: No evidence of fracture or other significant bone abnormality. Sinuses/Orbits:  No acute findings. Other: None. CT CERVICAL SPINE FINDINGS Alignment: Normal. Skull base and vertebrae: No acute fracture. No primary bone lesion or focal pathologic process. Soft tissues and spinal canal:  No prevertebral fluid or swelling. No visible canal hematoma. Disc levels: Moderate degenerative disc disease again seen at C5-6 and C6-7, with prominent osteophytosis. Moderate left-sided facet DJD is seen at C2-3 and C3-4, increased since prior exam. Upper chest: No acute findings. Other: None. IMPRESSION: No acute intracranial abnormality. Mild chronic small vessel disease. No evidence of cervical spine fracture or subluxation. Moderate degenerative  disc disease at C5-6 and C6-7. Moderate left-sided facet DJD at C2-3 and C3-4. Electronically Signed   By: Danae Orleans M.D.   On: 06/02/2023 10:48   DG Pelvis Portable Result Date: 06/02/2023 CLINICAL DATA:  Pain after trauma EXAM: PORTABLE PELVIS 1 VIEWS COMPARISON:  CT chest abdomen pelvis 09/30/2022. FINDINGS: No fracture or dislocation. Preserved joint spaces. Osteopenia. Vascular calcification seen. Right-sided ureteral stent in place. Degenerative changes of the visualized lumbar spine IMPRESSION: No acute osseous abnormality. Electronically Signed   By: Karen Kays M.D.   On: 06/02/2023 10:22   DG FEMUR PORT 1V LEFT Result Date: 06/02/2023 CLINICAL DATA:  Pain after trauma EXAM: LEFT FEMUR PORTABLE 1 VIEW COMPARISON:  None Available. FINDINGS: Osteopenia. No fracture or dislocation. Degenerative changes seen about the new particularly with joint space loss of the medial compartment. Scattered vascular calcifications. IMPRESSION: Osteopenia. Degenerative changes of the knee joint. Limited one view exam Electronically Signed   By: Karen Kays M.D.   On: 06/02/2023 10:21   DG Chest Port 1 View Result Date: 06/02/2023 CLINICAL DATA:  Fall.  Found down. EXAM: PORTABLE CHEST 1 VIEW COMPARISON:  10/04/2022 FINDINGS: The lungs are clear without focal pneumonia, edema, pneumothorax or pleural effusion. Minimal atelectasis noted left base. The cardiopericardial silhouette is within normal limits for size. No acute bony abnormality. Telemetry leads overlie the  chest. IMPRESSION: Minimal left base atelectasis. Otherwise no acute cardiopulmonary findings. Electronically Signed   By: Kennith Center M.D.   On: 06/02/2023 10:20    Microbiology: Results for orders placed or performed during the hospital encounter of 09/30/22  Culture, blood (Routine x 2)     Status: None   Collection Time: 09/30/22  6:30 AM   Specimen: BLOOD LEFT FOREARM  Result Value Ref Range Status   Specimen Description BLOOD LEFT FOREARM  Final   Special Requests   Final    BOTTLES DRAWN AEROBIC AND ANAEROBIC Blood Culture adequate volume   Culture   Final    NO GROWTH 5 DAYS Performed at [facility], [address], [place], [place] [address]    Report Status 10/05/2022 FINAL  Final  Culture, blood (Routine x 2)     Status: Abnormal   Collection Time: 09/30/22  6:30 AM   Specimen: BLOOD  Result Value Ref Range Status   Specimen Description BLOOD LEFT ANTECUBITAL  Final   Special Requests   Final    BOTTLES DRAWN AEROBIC AND ANAEROBIC Blood Culture results may not be optimal due to an excessive volume of blood received in culture bottles   Culture  Setup Time   Final    GRAM NEGATIVE RODS AEROBIC BOTTLE ONLY CRITICAL RESULT CALLED TO, READ BACK BY AND VERIFIED WITH: Chestine Spore [id number] BY Berline Chough MT Performed at [facility], [address], [place], [place] [address]    Culture ESCHERICHIA COLI (A)  Final   Report Status 10/03/2022 FINAL  Final   Organism ID, Bacteria ESCHERICHIA COLI  Final      Susceptibility   Escherichia coli - MIC*    AMPICILLIN >=32 RESISTANT Resistant     CEFEPIME <=0.12 SENSITIVE Sensitive     CEFTAZIDIME <=1 SENSITIVE Sensitive     CEFTRIAXONE 1 SENSITIVE Sensitive     CIPROFLOXACIN <=0.25 SENSITIVE Sensitive     GENTAMICIN <=1 SENSITIVE Sensitive     IMIPENEM <=0.25 SENSITIVE Sensitive     TRIMETH/SULFA <=20 SENSITIVE Sensitive     AMPICILLIN/SULBACTAM >=32 RESISTANT  Resistant     PIP/TAZO 8 SENSITIVE  Sensitive     * ESCHERICHIA COLI  Resp panel by RT-PCR (RSV, Flu A&B, Covid) Anterior Nasal Swab     Status: None   Collection Time: 09/30/22  6:30 AM   Specimen: Anterior Nasal Swab  Result Value Ref Range Status   SARS Coronavirus 2 by RT PCR NEGATIVE NEGATIVE Final   Influenza A by PCR NEGATIVE NEGATIVE Final   Influenza B by PCR NEGATIVE NEGATIVE Final    Comment: (NOTE) The Xpert Xpress SARS-CoV-2/FLU/RSV plus assay is intended as an aid in the diagnosis of influenza from Nasopharyngeal swab specimens and should not be used as a sole basis for treatment. Nasal washings and aspirates are unacceptable for Xpert Xpress SARS-CoV-2/FLU/RSV testing.  Fact Sheet for Patients: BloggerCourse.com  Fact Sheet for Healthcare Providers: SeriousBroker.it  This test is not yet approved or cleared by the [place] FDA and has been authorized for detection and/or diagnosis of SARS-CoV-2 by FDA under an Emergency Use Authorization (EUA). This EUA will remain in effect (meaning this test can be used) for the duration of the COVID-19 declaration under Section 564(b)(1) of the Act, 21 U.S.C. section 360bbb-3(b)(1), unless the authorization is terminated or revoked.     Resp Syncytial Virus by PCR NEGATIVE NEGATIVE Final    Comment: (NOTE) Fact Sheet for Patients: BloggerCourse.com  Fact Sheet for Healthcare Providers: SeriousBroker.it  This test is not yet approved or cleared by the [place] FDA and has been authorized for detection and/or diagnosis of SARS-CoV-2 by FDA under an Emergency Use Authorization (EUA). This EUA will remain in effect (meaning this test can be used) for the duration of the COVID-19 declaration under Section 564(b)(1) of the Act, 21 U.S.C. section 360bbb-3(b)(1), unless the authorization is terminated or revoked.  Performed at [facility], [address], [place], [place] [address]   Blood Culture ID Panel (Reflexed)     Status: Abnormal   Collection Time: 09/30/22  6:30 AM  Result Value Ref Range Status   Enterococcus faecalis NOT DETECTED NOT DETECTED Final   Enterococcus Faecium NOT DETECTED NOT DETECTED Final   Listeria monocytogenes NOT DETECTED NOT DETECTED Final   Staphylococcus species NOT DETECTED NOT DETECTED Final   Staphylococcus aureus (BCID) NOT DETECTED NOT DETECTED Final   Staphylococcus epidermidis NOT DETECTED NOT DETECTED Final   Staphylococcus lugdunensis NOT DETECTED NOT DETECTED Final   Streptococcus species NOT DETECTED NOT DETECTED Final   Streptococcus agalactiae NOT DETECTED NOT DETECTED Final   Streptococcus pneumoniae NOT DETECTED NOT DETECTED Final   Streptococcus pyogenes NOT DETECTED NOT DETECTED Final   A.calcoaceticus-baumannii NOT DETECTED NOT DETECTED Final   Bacteroides fragilis NOT DETECTED NOT DETECTED Final   Enterobacterales DETECTED (A) NOT DETECTED Final    Comment: Enterobacterales represent a large order of gram negative bacteria, not a single organism. CRITICAL RESULT CALLED TO, READ BACK BY AND VERIFIED WITH: PHARMD LAUREN BELL [id number] BY J RAZZAK MT    Enterobacter cloacae complex NOT DETECTED NOT DETECTED Final   Escherichia coli DETECTED (A) NOT DETECTED Final    Comment: CRITICAL RESULT CALLED TO, READ BACK BY AND VERIFIED WITH: PHARMD LAUREN BELL [phone] [id number] BY J RAZZAK MT    Klebsiella aerogenes NOT DETECTED NOT DETECTED Final   Klebsiella oxytoca NOT DETECTED NOT DETECTED Final   Klebsiella pneumoniae NOT DETECTED NOT DETECTED Final   Proteus species NOT DETECTED NOT DETECTED Final   Salmonella species NOT DETECTED NOT DETECTED  Final   Serratia marcescens NOT DETECTED NOT DETECTED Final   Haemophilus influenzae NOT DETECTED NOT DETECTED Final   Neisseria meningitidis NOT DETECTED NOT DETECTED Final   Pseudomonas aeruginosa NOT DETECTED NOT DETECTED Final    Stenotrophomonas maltophilia NOT DETECTED NOT DETECTED Final   Candida albicans NOT DETECTED NOT DETECTED Final   Candida auris NOT DETECTED NOT DETECTED Final   Candida glabrata NOT DETECTED NOT DETECTED Final   Candida krusei NOT DETECTED NOT DETECTED Final   Candida parapsilosis NOT DETECTED NOT DETECTED Final   Candida tropicalis NOT DETECTED NOT DETECTED Final   Cryptococcus neoformans/gattii NOT DETECTED NOT DETECTED Final   CTX-M ESBL NOT DETECTED NOT DETECTED Final   Carbapenem resistance IMP NOT DETECTED NOT DETECTED Final   Carbapenem resistance KPC NOT DETECTED NOT DETECTED Final   Carbapenem resistance NDM NOT DETECTED NOT DETECTED Final   Carbapenem resist OXA 48 LIKE NOT DETECTED NOT DETECTED Final   Carbapenem resistance VIM NOT DETECTED NOT DETECTED Final    Comment: Performed at [facility], [address], [place], [place] [address]  Urine Culture     Status: Abnormal   Collection Time: 09/30/22  9:00 AM   Specimen: Urine, Random  Result Value Ref Range Status   Specimen Description URINE, RANDOM  Final   Special Requests URINE, CLEAN CATCH  Final   Culture (A)  Final    <10,000 COLONIES/mL INSIGNIFICANT GROWTH Performed at [facility], [address], [place], [place] [address]    Report Status 10/01/2022 FINAL  Final  Urine Culture     Status: None   Collection Time: 09/30/22  2:30 PM   Specimen: Urine, Cystoscope  Result Value Ref Range Status   Specimen Description PELVIS  Final   Special Requests RIGHT RENAL, UR  Final   Culture   Final    NO GROWTH Performed at [facility], [address], [place], [place] [address]    Report Status 10/01/2022 FINAL  Final  MRSA Next Gen by PCR, Nasal     Status: None   Collection Time: 10/01/22  5:54 AM   Specimen: Nasal Mucosa; Nasal Swab  Result Value Ref Range Status   MRSA by PCR Next Gen NOT DETECTED NOT DETECTED Final    Comment: (NOTE) The GeneXpert MRSA Assay (FDA approved for NASAL  specimens only), is one component of a comprehensive MRSA colonization surveillance program. It is not intended to diagnose MRSA infection nor to guide or monitor treatment for MRSA infections. Test performance is not FDA approved in patients less than 75 years old. Performed at [facility], [address] [place], [place] [address]   Group A Strep by PCR     Status: None   Collection Time: 10/02/22  3:56 PM   Specimen: Throat; Sterile Swab  Result Value Ref Range Status   Group A Strep by PCR NOT DETECTED NOT DETECTED Final    Comment: Performed at [facility], [address] [place], [place] [address]  Culture, blood (Routine X 2) w Reflex to ID Panel     Status: None   Collection Time: 10/03/22  7:58 AM   Specimen: BLOOD LEFT HAND  Result Value Ref Range Status   Specimen Description BLOOD LEFT HAND  Final   Special Requests   Final    BOTTLES DRAWN AEROBIC AND ANAEROBIC Blood Culture adequate volume   Culture   Final    NO GROWTH 5 DAYS  Performed at [facility], [address], [place], [place] [address]    Report Status 10/08/2022 FINAL  Final  Culture, blood (Routine X 2) w Reflex to ID Panel     Status: None   Collection Time: 10/03/22  7:59 AM   Specimen: BLOOD RIGHT ARM  Result Value Ref Range Status   Specimen Description BLOOD RIGHT ARM  Final   Special Requests   Final    BOTTLES DRAWN AEROBIC AND ANAEROBIC Blood Culture adequate volume   Culture   Final    NO GROWTH 5 DAYS Performed at [facility], [address], [place], [place] [address]    Report Status 10/08/2022 FINAL  Final    Labs: CBC: Recent Labs  Lab 06/02/23 1001 06/02/23 1012  WBC 10.0  --   HGB 11.6* 11.6*  HCT 36.8 34.0*  MCV 90.4  --   PLT 511*  --    Basic Metabolic Panel: Recent Labs  Lab 06/02/23 1012 06/02/23 1350  NA 136 137  K 5.8* 4.5  CL 108 106  CO2  --  24  GLUCOSE 129* 127*  BUN 13 11  CREATININE 1.10* 1.02*  CALCIUM  --  9.0    Liver Function Tests: Recent Labs  Lab 06/02/23 1350  AST 18  ALT 11  ALKPHOS 47  BILITOT 0.4  PROT 6.5  ALBUMIN 3.1*   CBG: Recent Labs  Lab 06/04/23 0349 06/04/23 1123 06/04/23 1613 06/04/23 2117 06/05/23 0628  GLUCAP 123* 159* 126* 177* 150*    Discharge time spent: greater than 30 minutes.  Signed: Thad Ranger, MD [facility] 06/05/2023

## 2023-06-05 NOTE — Patient Instructions (Addendum)
 SURGICAL WAITING ROOM VISITATION  Patients having surgery or a procedure may have no more than 2 support people in the waiting area - these visitors may rotate.    Children under the age of 41 must have an adult with them who is not the patient.  Due to an increase in RSV and influenza rates and associated hospitalizations, children ages 66 and under may not visit patients in [facility] hospitals.  Visitors with respiratory illnesses are discouraged from visiting and should remain at home.  If the patient needs to stay at the hospital during part of their recovery, the visitor guidelines for inpatient rooms apply. Pre-op nurse will coordinate an appropriate time for 1 support person to accompany patient in pre-op.  This support person may not rotate.    Please refer to the [facility] website for the visitor guidelines for Inpatients (after your surgery is over and you are in a regular room).       Your procedure is scheduled on: 06-12-23   Report to [facility] Main Entrance    Report to admitting at   0615 AM   Call this number if you have problems the morning of surgery [phone]   Do not eat food or drink liquids  :After Midnight.                               If you have questions, please contact your surgeon's office.   FOLLOW ADDITIONAL PRE OP INSTRUCTIONS YOU RECEIVED FROM YOUR SURGEON'S OFFICE!!!     Oral Hygiene is also important to reduce your risk of infection.                                    Remember - BRUSH YOUR TEETH THE MORNING OF SURGERY WITH YOUR REGULAR TOOTHPASTE  DENTURES WILL BE REMOVED PRIOR TO SURGERY PLEASE DO NOT APPLY "Poly grip" OR ADHESIVES!!!   Do NOT smoke after Midnight   Stop all vitamins and herbal supplements 7 days before surgery.   Take these medicines the morning of surgery with A SIP OF WATER:  inhalers as usual and bring, buspar, prozac, flonase if needed, flomax, protonix, nebulizer if needed, claritin if needed,  meclizine if needed, gabapentin, topamax, hyrdoxyzine                Metformin- none day of surgery             Toujeo- Take 1/2 dose nite before surgery             Dulaglutide- STOP ONE WEEK PRIOR TO SURGERY              Novolog with meals- none am of surgery   DO NOT TAKE ANY ORAL DIABETIC MEDICATIONS DAY OF YOUR SURGERY  Bring CPAP mask and tubing day of surgery.                              You may not have any metal on your body including hair pins, jewelry, and body piercing             Do not wear make-up, lotions, powders, perfumes/cologne, or deodorant  Do not wear nail polish including gel and S&S, artificial/acrylic nails, or any other type of covering on natural nails including finger and toenails. If  you have artificial nails, gel coating, etc. that needs to be removed by a nail salon please have this removed prior to surgery or surgery may need to be canceled/ delayed if the surgeon/ anesthesia feels like they are unable to be safely monitored.   Do not shave  48 hours prior to surgery.              Do not bring valuables to the hospital. [facility] IS NOT             RESPONSIBLE   FOR VALUABLES.   Contacts, glasses, dentures or bridgework may not be worn into surgery.   Bring small overnight bag day of surgery.   DO NOT BRING YOUR HOME MEDICATIONS TO THE HOSPITAL. PHARMACY WILL DISPENSE MEDICATIONS LISTED ON YOUR MEDICATION LIST TO YOU DURING YOUR ADMISSION IN THE HOSPITAL!    Patients discharged on the day of surgery will not be allowed to drive home.  Someone NEEDS to stay with you for the first 24 hours after anesthesia.   Special Instructions: Bring a copy of your healthcare power of attorney and living will documents the day of surgery if you haven't scanned them before.              Please read over the following fact sheets you were given: IF YOU HAVE QUESTIONS ABOUT YOUR PRE-OP INSTRUCTIONS PLEASE CALL [phone]    If you test positive for Covid or  have been in contact with anyone that has tested positive in the last 10 days please notify you surgeon.    [facility] - Preparing for Surgery Before surgery, you can play an important role.  Because skin is not sterile, your skin needs to be as free of germs as possible.  You can reduce the number of germs on your skin by washing with CHG (chlorahexidine gluconate) soap before surgery.  CHG is an antiseptic cleaner which kills germs and bonds with the skin to continue killing germs even after washing. Please DO NOT use if you have an allergy to CHG or antibacterial soaps.  If your skin becomes reddened/irritated stop using the CHG and inform your nurse when you arrive at Short Stay. Do not shave (including legs and underarms) for at least 48 hours prior to the first CHG shower.  You may shave your face/neck. Please follow these instructions carefully:  1.  Shower with CHG Soap the night before surgery and the  morning of Surgery.  2.  If you choose to wash your hair, wash your hair first as usual with your  normal  shampoo.  3.  After you shampoo, rinse your hair and body thoroughly to remove the  shampoo.                           4.  Use CHG as you would any other liquid soap.  You can apply chg directly  to the skin and wash                       Gently with a scrungie or clean washcloth.  5.  Apply the CHG Soap to your body ONLY FROM THE NECK DOWN.   Do not use on face/ open                           Wound or open sores. Avoid contact with eyes, ears mouth and  genitals (private parts).                       Wash face,  Genitals (private parts) with your normal soap.             6.  Wash thoroughly, paying special attention to the area where your surgery  will be performed.  7.  Thoroughly rinse your body with warm water from the neck down.  8.  DO NOT shower/wash with your normal soap after using and rinsing off  the CHG Soap.                9.  Pat yourself dry with a clean towel.             10.  Wear clean pajamas.            11.  Place clean sheets on your bed the night of your first shower and do not  sleep with pets. Day of Surgery : Do not apply any lotions/deodorants the morning of surgery.  Please wear clean clothes to the hospital/surgery center.  FAILURE TO FOLLOW THESE INSTRUCTIONS MAY RESULT IN THE CANCELLATION OF YOUR SURGERY PATIENT SIGNATURE_________________________________  NURSE SIGNATURE__________________________________  ________________________________________________________________________

## 2023-06-05 NOTE — Progress Notes (Signed)
 Name: Anna [name] DOB: 09-01-1955  Please be advised that the above-named patient will require a short-term nursing home stay -- anticipated 30 days or less for rehabilitation and strengthening. The plan is for return home.

## 2023-06-05 NOTE — TOC Transition Note (Signed)
 Transition of Care [facility]) - Discharge Note   Patient Details  Name: Anna [name] MRN: [id number] Date of Birth: 09-28-1955  Transition of Care [facility]) CM/SW Contact:  Baldemar Lenis, LCSW Phone Number: 06/05/2023, 1:50 PM   Clinical Narrative:   CSW contacted by [facility] that they can no longer offer a bed for the patient, did not provide an explanation. CSW contacted [facility] and spoke with Child psychotherapist, she said to reach out to [facility]. CSW sent referral to [facility], they are able to offer a bed. [facility] to provide transportation between 2 and 3.  Nurse to call report to [phone], Room 214.    Final next level of care: Skilled Nursing Facility Barriers to Discharge: Barriers Resolved   Patient Goals and CMS Choice   CMS Medicare.gov Compare Post Acute Care list provided to:: Patient Choice offered to / list presented to : Patient ([facility])      Discharge Placement              Patient chooses bed at: [facility] Patient to be transferred to facility by: [facility] Name of family member notified: Self Patient and family notified of of transfer: 06/05/23  Discharge Plan and Services Additional resources added to the After Visit Summary for   In-house Referral: Clinical Social Work Discharge Planning Services: CM Consult Post Acute Care Choice: Skilled Nursing Facility                               Social Drivers of Health (SDOH) Interventions SDOH Screenings   Food Insecurity: Food Insecurity Present (06/03/2023)  Housing: High Risk (06/03/2023)  Transportation Needs: No Transportation Needs (06/03/2023)  Utilities: At Risk (06/03/2023)  Social Connections: Socially Isolated (06/03/2023)  Tobacco Use: Low Risk  (06/02/2023)     Readmission Risk Interventions     No data to display

## 2023-06-07 ENCOUNTER — Encounter (HOSPITAL_COMMUNITY): Payer: Self-pay | Admitting: Physician Assistant

## 2023-06-07 ENCOUNTER — Encounter (HOSPITAL_COMMUNITY)
Admission: RE | Admit: 2023-06-07 | Discharge: 2023-06-07 | Disposition: A | Discharge status: Home / Self Care | Payer: Medicare (Managed Care) | Source: Ambulatory Visit | Attending: Family Medicine | Admitting: Family Medicine

## 2023-06-07 NOTE — Progress Notes (Signed)
 Unable to leave VM pt. Mailbox full.Pt. not at preop appt.

## 2023-06-10 NOTE — Progress Notes (Signed)
 Preop instructions for:     Anna [name] Date of Birth:    2056-03-22                   Date of Procedure:   06-12-23 Procedure:    Cystoscopy with right retrograde pyelogram, ureteral stent exchange Surgeon: Dr. Devoria Glassing Facility contact: Wilkie Aye     Phone:  [phone]           Health Care POA: RN contact name/phone#:                          and Fax #: [phone]   Transportation contact phone#:   Please send day of procedure:  Current med list  Medications taken the day of procedure (return attached form to hospital) confirm time of nothing by mouth status (return attached form to hospital) Patient Demographic info( to include DNR status, problem list, allergies) Bring Insurance card and picture ID    [facility] to [facility] at [facility]: 6:15 AM   Report to: Admitting (On your left hand side)    Do not eat solid food or drink liquids past midnight the night before your procedure.(To include any tube feedings-must be discontinued)  Take these morning medications only with sips of water.(or give through gastrostomy or feeding tube).  Buspirone, Fluxoetine, Gabapentin, Hydroxyzine, Loratadine, Tamsulosin, Topiramate.  If needed Tylenol, Meclizine, Ondansetron  Stop all vitamins and herbal supplements 7 days before surgery.  Hold Dulaglutide 7 days before surgery.     The Day Before Surgery:  [place] half dose of Toujeo the night before surgery.     Note: No Insulin or Diabetic meds should be given or taken the morning of the procedure!  Oral Hygiene is also important to reduce your risk of infection.                                    Remember - BRUSH YOUR TEETH THE MORNING OF SURGERY WITH YOUR REGULAR TOOTHPASTE   DENTURES WILL BE REMOVED PRIOR TO SURGERY PLEASE DO NOT APPLY "Poly grip" OR ADHESIVES!!!  Leave all jewelry and other valuables at place where living( no metal or rings to be worn) No contact lens Women-no make-up, no  lotions,perfumes,powders Men-no colognes,lotions   Any questions day of procedure,call  SHORT STAY-[phone]     Sent from :[facility]                   [phone]                   Fax:[phone]   Sent by :  Derek Mound, RN

## 2023-06-10 NOTE — Progress Notes (Addendum)
 COVID Vaccine Completed:  Date of COVID positive in last 90 days:  PCP - [facility] Cardiologist -   Chest x-ray - 06-02-23 Epic EKG - 06-02-23 Epic Stress Test -  ECHO - 06-03-23 Epic Cardiac Cath - 05-12-21 CEW Pacemaker/ICD device last checked: Spinal Cord Stimulator:  Bowel Prep -   Sleep Study -  CPAP -   Fasting Blood Sugar - While in hospital ranging from 123 to 261 Checks Blood Sugar _____ times a day  Dulaglutide (administered on Saturdays) Last dose of GLP1 agonist-  N/A GLP1 instructions:  Hold 7 days before surgery    Last dose of SGLT-2 inhibitors-  N/A SGLT-2 instructions:  Hold 3 days before surgery   Blood Thinner Instructions:  Brilinta Aspirin Instructions:  ASA 81  Per Dr. Liliane Shi patient is to stay on Brilinta and ASA 81 Last Dose:  Activity level:  Patient resides at [place] and needs assistance with ambulation and ADLs.  Anesthesia review:  Recent stroke, COPD, DM, HTN, PAD, OSA  Patient denies shortness of breath, fever, cough and chest pain at PAT appointment  Patient verbalized understanding of instructions that were given to them at the PAT appointment. Patient was also instructed that they will need to review over the PAT instructions again at home before surgery.

## 2023-06-10 NOTE — Patient Instructions (Signed)
 Preop instructions for:     Anna [name] Date of Birth:    2056-03-22                   Date of Procedure:   06-12-23 Procedure:    Cystoscopy with right retrograde pyelogram, ureteral stent exchange Surgeon: Dr. Devoria Glassing Facility contact: Wilkie Aye     Phone:  [phone]           Health Care POA: RN contact name/phone#:                          and Fax #: [phone]   Transportation contact phone#:   Please send day of procedure:  Current med list  Medications taken the day of procedure (return attached form to hospital) confirm time of nothing by mouth status (return attached form to hospital) Patient Demographic info( to include DNR status, problem list, allergies) Bring Insurance card and picture ID    [facility] to [facility] at [facility]: 6:15 AM   Report to: Admitting (On your left hand side)    Do not eat solid food or drink liquids past midnight the night before your procedure.(To include any tube feedings-must be discontinued)  Take these morning medications only with sips of water.(or give through gastrostomy or feeding tube).  Buspirone, Fluxoetine, Gabapentin, Hydroxyzine, Loratadine, Tamsulosin, Topiramate.  If needed Tylenol, Meclizine, Ondansetron  Stop all vitamins and herbal supplements 7 days before surgery.  Hold Dulaglutide 7 days before surgery.     The Day Before Surgery:  [place] half dose of Toujeo the night before surgery.     Note: No Insulin or Diabetic meds should be given or taken the morning of the procedure!  Oral Hygiene is also important to reduce your risk of infection.                                    Remember - BRUSH YOUR TEETH THE MORNING OF SURGERY WITH YOUR REGULAR TOOTHPASTE   DENTURES WILL BE REMOVED PRIOR TO SURGERY PLEASE DO NOT APPLY "Poly grip" OR ADHESIVES!!!  Leave all jewelry and other valuables at place where living( no metal or rings to be worn) No contact lens Women-no make-up, no  lotions,perfumes,powders Men-no colognes,lotions   Any questions day of procedure,call  SHORT STAY-[phone]     Sent from :[facility]                   [phone]                   Fax:[phone]   Sent by :  Derek Mound, RN

## 2023-06-12 ENCOUNTER — Ambulatory Visit (HOSPITAL_COMMUNITY): Admission: RE | Admit: 2023-06-12 | Payer: Medicare (Managed Care) | Source: Home / Self Care | Admitting: Urology

## 2023-06-12 ENCOUNTER — Encounter (HOSPITAL_COMMUNITY): Admission: RE | Payer: Self-pay | Source: Home / Self Care

## 2023-06-12 DIAGNOSIS — Z01818 Encounter for other preprocedural examination: Secondary | ICD-10-CM

## 2023-06-12 SURGERY — CYSTOSCOPY, WITH RETROGRADE PYELOGRAM AND URETERAL STENT INSERTION
Anesthesia: General | Laterality: Right

## 2023-08-08 ENCOUNTER — Emergency Department (HOSPITAL_COMMUNITY)
Admission: EM | Admit: 2023-08-08 | Discharge: 2023-08-08 | Disposition: A | Discharge status: Home / Self Care | Payer: Medicare (Managed Care) | Attending: Emergency Medicine | Admitting: Emergency Medicine

## 2023-08-08 ENCOUNTER — Emergency Department (HOSPITAL_COMMUNITY): Payer: Medicare (Managed Care)

## 2023-08-08 ENCOUNTER — Other Ambulatory Visit: Payer: Self-pay

## 2023-08-08 DIAGNOSIS — R112 Nausea with vomiting, unspecified: Secondary | ICD-10-CM | POA: Diagnosis present

## 2023-08-08 DIAGNOSIS — Z91040 Latex allergy status: Secondary | ICD-10-CM | POA: Diagnosis not present

## 2023-08-08 DIAGNOSIS — R197 Diarrhea, unspecified: Secondary | ICD-10-CM | POA: Diagnosis not present

## 2023-08-08 DIAGNOSIS — Z7982 Long term (current) use of aspirin: Secondary | ICD-10-CM | POA: Diagnosis not present

## 2023-08-08 DIAGNOSIS — N390 Urinary tract infection, site not specified: Secondary | ICD-10-CM | POA: Diagnosis not present

## 2023-08-08 LAB — URINALYSIS, W/ REFLEX TO CULTURE (INFECTION SUSPECTED)
Bilirubin Urine: NEGATIVE
Glucose, UA: NEGATIVE mg/dL
Ketones, ur: NEGATIVE mg/dL
Nitrite: POSITIVE — AB
Protein, ur: NEGATIVE mg/dL
Specific Gravity, Urine: 1.034 — ABNORMAL HIGH (ref 1.005–1.030)
pH: 5 (ref 5.0–8.0)

## 2023-08-08 LAB — COMPREHENSIVE METABOLIC PANEL WITH GFR
ALT: 10 U/L (ref 0–44)
AST: 19 U/L (ref 15–41)
Albumin: 3.2 g/dL — ABNORMAL LOW (ref 3.5–5.0)
Alkaline Phosphatase: 59 U/L (ref 38–126)
Anion gap: 11 (ref 5–15)
BUN: 10 mg/dL (ref 8–23)
CO2: 21 mmol/L — ABNORMAL LOW (ref 22–32)
Calcium: 9 mg/dL (ref 8.9–10.3)
Chloride: 104 mmol/L (ref 98–111)
Creatinine, Ser: 1.09 mg/dL — ABNORMAL HIGH (ref 0.44–1.00)
GFR, Estimated: 55 mL/min — ABNORMAL LOW (ref >60)
Glucose, Bld: 176 mg/dL — ABNORMAL HIGH (ref 70–99)
Potassium: 4.3 mmol/L (ref 3.5–5.1)
Sodium: 136 mmol/L (ref 135–145)
Total Bilirubin: 0.4 mg/dL (ref 0.0–1.2)
Total Protein: 7.5 g/dL (ref 6.5–8.1)

## 2023-08-08 LAB — CBC WITH DIFFERENTIAL/PLATELET
Abs Immature Granulocytes: 0.03 10*3/uL (ref 0.00–0.07)
Basophils Absolute: 0.1 10*3/uL (ref 0.0–0.1)
Basophils Relative: 1 %
Eosinophils Absolute: 0 10*3/uL (ref 0.0–0.5)
Eosinophils Relative: 0 %
HCT: 34.6 % — ABNORMAL LOW (ref 36.0–46.0)
Hemoglobin: 10.8 g/dL — ABNORMAL LOW (ref 12.0–15.0)
Immature Granulocytes: 0 %
Lymphocytes Relative: 22 %
Lymphs Abs: 2.5 10*3/uL (ref 0.7–4.0)
MCH: 28.7 pg (ref 26.0–34.0)
MCHC: 31.2 g/dL (ref 30.0–36.0)
MCV: 92 fL (ref 80.0–100.0)
Monocytes Absolute: 1.1 10*3/uL — ABNORMAL HIGH (ref 0.1–1.0)
Monocytes Relative: 10 %
Neutro Abs: 7.5 10*3/uL (ref 1.7–7.7)
Neutrophils Relative %: 67 %
Platelets: 418 10*3/uL — ABNORMAL HIGH (ref 150–400)
RBC: 3.76 MIL/uL — ABNORMAL LOW (ref 3.87–5.11)
RDW: 14.1 % (ref 11.5–15.5)
WBC: 11.2 10*3/uL — ABNORMAL HIGH (ref 4.0–10.5)
nRBC: 0 % (ref 0.0–0.2)

## 2023-08-08 LAB — LIPASE, BLOOD: Lipase: 32 U/L (ref 11–51)

## 2023-08-08 MED ORDER — ONDANSETRON 4 MG PO TBDP
4.0000 mg | ORAL_TABLET | Freq: Once | ORAL | Status: AC
  Administered 2023-08-08: 4 mg via ORAL
  Filled 2023-08-08: qty 1

## 2023-08-08 MED ORDER — SODIUM CHLORIDE 0.9 % IV BOLUS
1000.0000 mL | Freq: Once | INTRAVENOUS | Status: AC
  Administered 2023-08-08: 1000 mL via INTRAVENOUS

## 2023-08-08 MED ORDER — SULFAMETHOXAZOLE-TRIMETHOPRIM 800-160 MG PO TABS
1.0000 | ORAL_TABLET | Freq: Once | ORAL | Status: AC
  Administered 2023-08-08: 1 via ORAL
  Filled 2023-08-08: qty 1

## 2023-08-08 MED ORDER — IOHEXOL 300 MG/ML  SOLN
100.0000 mL | Freq: Once | INTRAMUSCULAR | Status: AC | PRN
Start: 2023-08-08 — End: 2023-08-08
  Administered 2023-08-08: 100 mL via INTRAVENOUS

## 2023-08-08 MED ORDER — ONDANSETRON HCL 4 MG/2ML IJ SOLN
4.0000 mg | Freq: Once | INTRAMUSCULAR | Status: AC
  Administered 2023-08-08: 4 mg via INTRAVENOUS
  Filled 2023-08-08: qty 2

## 2023-08-08 MED ORDER — ONDANSETRON 4 MG PO TBDP: 4.0000 mg | ORAL_TABLET | Freq: Three times a day (TID) | ORAL | 0 refills | Status: DC | PRN

## 2023-08-08 MED ORDER — LACTATED RINGERS IV BOLUS
1000.0000 mL | Freq: Once | INTRAVENOUS | Status: AC
  Administered 2023-08-08: 1000 mL via INTRAVENOUS

## 2023-08-08 MED ORDER — SULFAMETHOXAZOLE-TRIMETHOPRIM 800-160 MG PO TABS
1.0000 | ORAL_TABLET | Freq: Two times a day (BID) | ORAL | 0 refills | Status: AC
Start: 2023-08-08 — End: 2023-08-15

## 2023-08-08 NOTE — ED Notes (Signed)
Pt been made aware urine sample needed.

## 2023-08-08 NOTE — ED Provider Notes (Signed)
 I received the patient in signout.  She is presenting today with a few days of vomiting.  Urinalysis is pending at the time of signout.  Will reevaluate for ultimate disposition as she reported feeling better after initial round of medications.  The patient's blood pressures are in the 90s systolic.  I have reviewed her most recent echocardiogram.  A fluid bolus is ordered.  Temp is 100.1.  Will add on a chest x-ray in addition to the urinalysis to evaluate for infectious etiologies.  Patient's blood pressures returned to normal before the fluids were started.  Chest x-ray is unremarkable.  Urinalysis does show some findings concerning for infection.  I did call and discussed this with Dr. Derrick Fling.  He recommends starting the patient on Bactrim and having her follow-up in the clinic.  The patient is given Bactrim here.  She was complaining of some mild nausea and was given ODT Zofran .  Her symptoms resolved with this.  On reassessment she is feeling better and is requesting discharge.  She is discharged with return precautions. Physical Exam  BP 125/70   Pulse 86   Temp 100.1 F (37.8 C) (Oral)   Resp 16   Ht 5\' 1"  (1.549 m)   Wt 70 kg   SpO2 98%   BMI 29.16 kg/m   Physical Exam General: No acute distress  Procedures  Procedures  ED Course / MDM    Medical Decision Making Amount and/or Complexity of Data Reviewed Labs: ordered. Radiology: ordered.  Risk Prescription drug management.          Anna Charleston, MD 08/08/23 [phone]

## 2023-08-08 NOTE — ED Notes (Signed)
Ptar contacted. 

## 2023-08-08 NOTE — ED Triage Notes (Signed)
 Pt complaining of emesis since Monday. Pt states that she has not been able to eat due to throwing it back up. Pt states that she is able to sip on fluids. Pt states that she has diarrhea. Pt states that the zofran  she was given has not helped with her symptoms.

## 2023-08-08 NOTE — ED Provider Notes (Signed)
 [place] EMERGENCY DEPARTMENT AT [facility] Provider Note   CSN: [id number] Arrival date & time: 08/08/23  1100     History {Add pertinent medical, surgical, social history, OB history to HPI:1} Chief Complaint  Patient presents with   Emesis    Anna [name] is a 68 y.o. female.  68 year old female with prior medical history as detailed below presents for evaluation for patient complains of nausea, vomiting, diarrhea.  Symptoms began 2 days ago.  She is reporting subjective fever.  She complains of diffuse abdominal discomfort.  She denies bloody emesis or bloody stool.  She reports that any p.o. liquids or solids make her want to throw up.  She reports that she had some Zofran  at home that she trialed without improvement or control of her symptoms.  The history is provided by the patient.       Home Medications Prior to Admission medications   Medication Sig Start Date End Date Taking? Authorizing Provider  acetaminophen  (TYLENOL ) 500 MG tablet Take 1,000-1,500 mg by mouth every 8 (eight) hours as needed for moderate pain (pain score 4-6).    [provider]  albuterol  (VENTOLIN  HFA) 108 (90 Base) MCG/ACT inhaler Inhale 1-2 puffs into the lungs every 6 (six) hours as needed for wheezing or shortness of breath. 10/04/22   Singh, Prashant K, MD  Apoaequorin (PREVAGEN) 10 MG CAPS Take 10 mg by mouth daily.    [provider]  aspirin  EC 81 MG tablet Take 81 mg by mouth daily. Swallow whole.    [provider]  Aspirin -Acetaminophen -Caffeine (GOODY HEADACHE PO) Take 2 packets by mouth daily as needed (headaches).    [provider]  atorvastatin  (LIPITOR ) 80 MG tablet Take 1 tablet (80 mg total) by mouth at bedtime. 06/04/23   Regalado, Belkys A, MD  benzonatate  (TESSALON ) 100 MG capsule Take 100 mg by mouth 3 (three) times daily as needed for cough.    [provider]  busPIRone  (BUSPAR ) 7.5 MG tablet Take 7.5 mg by mouth 2  (two) times daily.    [provider]  diclofenac  Sodium (VOLTAREN ) 1 % GEL Apply 4 g topically as needed (joint pain, phantom joint pain).    [provider]  Dulaglutide 3 MG/0.5ML SOPN Inject 3 mg into the skin every Saturday.    [provider]  estradiol (ESTRACE) 0.1 MG/GM vaginal cream Place 1 Applicatorful vaginally as needed (dryness / irritation).    [provider]  FLUoxetine  (PROZAC ) 40 MG capsule Take 40 mg by mouth daily.    [provider]  fluticasone (FLONASE) 50 MCG/ACT nasal spray Place 1 spray into both nostrils daily as needed for allergies or rhinitis.    [provider]  gabapentin  (NEURONTIN ) 400 MG capsule Take 400 mg by mouth 3 (three) times daily.    [provider]  hydrOXYzine  (ATARAX ) 10 MG tablet Take 1 tablet (10 mg total) by mouth 2 (two) times daily. Patient taking differently: Take 10 mg by mouth daily. 10/13/21   Samtani, Jai-Gurmukh, MD  insulin  aspart (NOVOLOG ) 100 UNIT/ML FlexPen Use as directed before each meal 3 times a day: 140-199 - 2 units, 200-250 - 4 units, 251-299 - 6 units,  300-349 - 8 units,  350 or above 10 units. Patient taking differently: Inject 2-6 Units into the skin 3 (three) times daily as needed for high blood sugar. 10/04/22   Singh, Prashant K, MD  insulin  glargine, 1 Unit Dial , (TOUJEO  SOLOSTAR) 300 UNIT/ML Solostar  Pen Inject 10 Units into the skin at bedtime. 06/04/23   Regalado, Belkys A, MD  ipratropium-albuterol  (DUONEB) 0.5-2.5 (3) MG/3ML SOLN Use 3 ml by nebulization twice a day scheduled and every 4 hours as needed for shortness of breath and wheezing Patient taking differently: Take 3 mLs by nebulization daily as needed (SOB and wheezing). Use 3 ml by nebulization twice a day scheduled and every 4 hours as needed for shortness of breath and wheezing 10/04/22   Singh, Prashant K, MD  Lactobacillus Rhamnosus, GG, (CULTURELLE HEALTH & WELLNESS) CAPS Take 1 capsule by mouth  daily. 08/05/21   [provider]  lidocaine  4 % Place 1 patch onto the skin daily as needed (pain).    [provider]  lisinopril  (ZESTRIL ) 5 MG tablet Take 5 mg by mouth at bedtime.    [provider]  loratadine  (CLARITIN ) 10 MG tablet Take 10 mg by mouth daily as needed for allergies.    [provider]  meclizine (ANTIVERT) 25 MG tablet Take 25 mg by mouth daily as needed for dizziness. 10/01/19   [provider]  Menthol, Topical Analgesic, (BIOFREEZE EX) Apply 1 Application topically daily as needed (neck and shoulder pain).    [provider]  metFORMIN  (GLUCOPHAGE ) 1000 MG tablet Take 1,000 mg by mouth 2 (two) times daily with a meal.    [provider]  nystatin  (MYCOSTATIN ) 100000 UNIT/ML suspension Take 1 mL by mouth 4 (four) times daily as needed (red gums).    [provider]  nystatin  cream (MYCOSTATIN ) Apply 1 application. topically 4 (four) times daily as needed for dry skin.    [provider]  ondansetron  (ZOFRAN -ODT) 4 MG disintegrating tablet Take 4 mg by mouth every 8 (eight) hours as needed for nausea or vomiting.    [provider]  pantoprazole  (PROTONIX ) 40 MG tablet Take 40 mg by mouth 2 (two) times daily.    [provider]  rOPINIRole  (REQUIP ) 0.25 MG tablet Take 0.25 mg by mouth at bedtime.    [provider]  sennosides-docusate sodium  (SENOKOT-S) 8.6-50 MG tablet Take 1 tablet by mouth 2 (two) times daily as needed for constipation.    [provider]  tamsulosin  (FLOMAX ) 0.4 MG CAPS capsule Take 1 capsule (0.4 mg total) by mouth daily. 10/04/22   Singh, Prashant K, MD  topiramate  (TOPAMAX ) 50 MG tablet Take 1 tablet (50 mg total) by mouth 2 (two) times daily. 06/05/23   Rai, Hurman Maiden, MD      Allergies    Lasix [furosemide], Bacitracin, Citalopram, Doxycycline , Morphine and codeine, Neomycin, Ciprofloxacin , Ciprofloxacin -dexamethasone , Duloxetine,  Latex, Levaquin [levofloxacin in d5w], Other, Salvia officinalis, and Soma [carisoprodol]    Review of Systems   Review of Systems  All other systems reviewed and are negative.   Physical Exam Updated Vital Signs BP 110/60   Pulse 83   Temp 100 F (37.8 C)   Resp 18   Ht 5\' 1"  (1.549 m)   Wt 70 kg   SpO2 100%   BMI 29.16 kg/m  Physical Exam Vitals and nursing note reviewed.  Constitutional:      General: She is not in acute distress.    Appearance: Normal appearance. She is well-developed.  HENT:     Head: Normocephalic and atraumatic.  Eyes:     Conjunctiva/sclera: Conjunctivae normal.     Pupils: Pupils are equal, round, and reactive to light.  Cardiovascular:     Rate and Rhythm: Normal rate and  regular rhythm.     Heart sounds: Normal heart sounds.  Pulmonary:     Effort: Pulmonary effort is normal. No respiratory distress.     Breath sounds: Normal breath sounds.  Abdominal:     General: There is no distension.     Palpations: Abdomen is soft.     Tenderness: There is abdominal tenderness.  Musculoskeletal:        General: No deformity. Normal range of motion.     Cervical back: Normal range of motion and neck supple.  Skin:    General: Skin is warm and dry.  Neurological:     General: No focal deficit present.     Mental Status: She is alert and oriented to person, place, and time.     ED Results / Procedures / Treatments   Labs (all labs ordered are listed, but only abnormal results are displayed) Labs Reviewed  CBC WITH DIFFERENTIAL/PLATELET  COMPREHENSIVE METABOLIC PANEL WITH GFR  LIPASE, BLOOD  URINALYSIS, W/ REFLEX TO CULTURE (INFECTION SUSPECTED)    EKG None  Radiology No results found.  Procedures Procedures  {Document cardiac monitor, telemetry assessment procedure when appropriate:1}  Medications Ordered in ED Medications  sodium chloride  0.9 % bolus 1,000 mL (has no administration in time range)  ondansetron  (ZOFRAN ) injection 4  mg (has no administration in time range)    ED Course/ Medical Decision Making/ A&P   {   Click here for ABCD2, HEART and other calculatorsREFRESH Note before signing :1}                              Medical Decision Making Amount and/or Complexity of Data Reviewed Labs: ordered. Radiology: ordered.  Risk Prescription drug management.    Medical Screen Complete  This patient presented to the ED with complaint of nausea, vomiting.  This complaint involves an extensive number of treatment options. The initial differential diagnosis includes, but is not limited to, metabolic abnormality, gastroenteritis, bowel obstruction, etc.  This presentation is: Acute, Self-Limited, Previously Undiagnosed, Uncertain Prognosis, Complicated, Systemic Symptoms, and Threat to Life/Bodily Function   Presents with two days of nausea and vomiting.  She also reports associated diarrhea.  She does not appear to be toxic on evaluation.  Initial labs were reassuring without significant derangement.  CT imaging is also reassuring.  UA pending.  Oncoming EDP is aware of case.  Additional history obtained: External records from outside sources obtained and reviewed including prior ED visits and prior Inpatient records.   Problem List / ED Course:  Diarrhea, Nausea, Vomiting  Disposition:  After consideration of the diagnostic results and the patients response to treatment, I feel that the patent would benefit from completion of ED evaluation..    {Document critical care time when appropriate:1} {Document review of labs and clinical decision tools ie heart score, Chads2Vasc2 etc:1}  {Document your independent review of radiology images, and any outside records:1} {Document your discussion with family members, caretakers, and with consultants:1} {Document social determinants of health affecting pt's care:1} {Document your decision making why or why not admission, treatments were  needed:1} Final Clinical Impression(s) / ED Diagnoses Final diagnoses:  None    Rx / DC Orders ED Discharge Orders     None

## 2023-08-08 NOTE — Discharge Instructions (Signed)
 Please take the antibiotic as prescribed.  You may go back to half a tablet of your metformin  just while you are taking the antibiotic as the antibiotic can increase your metformin  levels in the blood.  Please take the Zofran  as needed for nausea.  Please call and schedule a follow-up appointment with the urologist at the number provided.  Return to the ER for worsening symptoms.

## 2023-08-08 NOTE — ED Notes (Signed)
 PTAr called again, pt is next in line

## 2023-08-10 LAB — URINE CULTURE: Culture: >=100000 — AB

## 2023-08-11 ENCOUNTER — Telehealth (HOSPITAL_BASED_OUTPATIENT_CLINIC_OR_DEPARTMENT_OTHER): Payer: Self-pay | Admitting: *Deleted

## 2023-08-11 NOTE — Telephone Encounter (Signed)
 Post ED Visit - Positive Culture Follow-up  Culture report reviewed by antimicrobial stewardship pharmacist: [facility] []  Court Distance, Pharm.D. []  Skeet Duke, Pharm.D., BCPS AQ-ID []  Leslee Rase, Pharm.D., BCPS []  Garland Junk, Pharm.D., BCPS []  [place], [address].D., BCPS, AAHIVP []  Alcide Aly, Pharm.D., BCPS, AAHIVP []  Jerri Morale, PharmD, BCPS []  Graham Laws, PharmD, BCPS []  Cleda Curly, PharmD, BCPS []  Tamar Fairly, PharmD []  Ballard Levels, PharmD, BCPS []  Ollen Beverage, PharmD  [facility] [x]  Arlyne Bering, PharmD []  Sherryle Don, PharmD []  [place] Gelin[place], PharmD []  Delila Felty, Rph []  [place]) [name], PharmD []  Augustina Block, PharmD []  Arie Kurtz, PharmD []  Sharlyn Deaner, PharmD []  Agnes Hose, PharmD []  Kendall Pauls, PharmD []  Gladstone Lamer, PharmD []  Armanda Bern, PharmD []  Tera Fellows, PharmD   Positive urine culture Treated with Sulfamethoxazole , organism sensitive to the same and no further patient follow-up is required at this time.  Anna [name] 08/11/2023, 10:09 AM

## 2023-08-13 LAB — CULTURE, BLOOD (ROUTINE X 2)
Culture: NO GROWTH
Culture: NO GROWTH

## 2023-09-02 ENCOUNTER — Telehealth: Payer: Self-pay | Admitting: Neurology

## 2023-09-02 ENCOUNTER — Inpatient Hospital Stay: Payer: Medicare (Managed Care) | Admitting: Neurology

## 2023-09-02 NOTE — Telephone Encounter (Signed)
 Appointment cx, pt has Covid

## 2023-09-09 ENCOUNTER — Other Ambulatory Visit: Payer: Self-pay

## 2023-09-09 ENCOUNTER — Emergency Department (HOSPITAL_COMMUNITY): Payer: Medicare (Managed Care)

## 2023-09-09 ENCOUNTER — Encounter (HOSPITAL_COMMUNITY): Payer: Self-pay

## 2023-09-09 ENCOUNTER — Inpatient Hospital Stay (HOSPITAL_COMMUNITY)
Admission: EM | Admit: 2023-09-09 | Discharge: 2023-09-23 | DRG: 280 | Disposition: A | Discharge status: Skilled Nursing Facility | Payer: Medicare (Managed Care) | Attending: Internal Medicine | Admitting: Internal Medicine

## 2023-09-09 DIAGNOSIS — J398 Other specified diseases of upper respiratory tract: Secondary | ICD-10-CM | POA: Diagnosis present

## 2023-09-09 DIAGNOSIS — I6503 Occlusion and stenosis of bilateral vertebral arteries: Secondary | ICD-10-CM | POA: Diagnosis present

## 2023-09-09 DIAGNOSIS — E785 Hyperlipidemia, unspecified: Secondary | ICD-10-CM | POA: Diagnosis present

## 2023-09-09 DIAGNOSIS — K219 Gastro-esophageal reflux disease without esophagitis: Secondary | ICD-10-CM | POA: Diagnosis present

## 2023-09-09 DIAGNOSIS — T380X5A Adverse effect of glucocorticoids and synthetic analogues, initial encounter: Secondary | ICD-10-CM | POA: Diagnosis present

## 2023-09-09 DIAGNOSIS — Z8673 Personal history of transient ischemic attack (TIA), and cerebral infarction without residual deficits: Secondary | ICD-10-CM

## 2023-09-09 DIAGNOSIS — E669 Obesity, unspecified: Secondary | ICD-10-CM | POA: Diagnosis present

## 2023-09-09 DIAGNOSIS — I5020 Unspecified systolic (congestive) heart failure: Secondary | ICD-10-CM

## 2023-09-09 DIAGNOSIS — E1165 Type 2 diabetes mellitus with hyperglycemia: Secondary | ICD-10-CM | POA: Diagnosis present

## 2023-09-09 DIAGNOSIS — Z8249 Family history of ischemic heart disease and other diseases of the circulatory system: Secondary | ICD-10-CM

## 2023-09-09 DIAGNOSIS — I130 Hypertensive heart and chronic kidney disease with heart failure and stage 1 through stage 4 chronic kidney disease, or unspecified chronic kidney disease: Secondary | ICD-10-CM | POA: Diagnosis present

## 2023-09-09 DIAGNOSIS — N179 Acute kidney failure, unspecified: Secondary | ICD-10-CM | POA: Diagnosis present

## 2023-09-09 DIAGNOSIS — I5023 Acute on chronic systolic (congestive) heart failure: Secondary | ICD-10-CM | POA: Diagnosis not present

## 2023-09-09 DIAGNOSIS — F32A Depression, unspecified: Secondary | ICD-10-CM | POA: Diagnosis present

## 2023-09-09 DIAGNOSIS — G2581 Restless legs syndrome: Secondary | ICD-10-CM | POA: Diagnosis present

## 2023-09-09 DIAGNOSIS — N135 Crossing vessel and stricture of ureter without hydronephrosis: Secondary | ICD-10-CM | POA: Diagnosis present

## 2023-09-09 DIAGNOSIS — J4521 Mild intermittent asthma with (acute) exacerbation: Secondary | ICD-10-CM | POA: Diagnosis present

## 2023-09-09 DIAGNOSIS — Z7984 Long term (current) use of oral hypoglycemic drugs: Secondary | ICD-10-CM

## 2023-09-09 DIAGNOSIS — Z91040 Latex allergy status: Secondary | ICD-10-CM

## 2023-09-09 DIAGNOSIS — Z515 Encounter for palliative care: Secondary | ICD-10-CM

## 2023-09-09 DIAGNOSIS — Z87440 Personal history of urinary (tract) infections: Secondary | ICD-10-CM

## 2023-09-09 DIAGNOSIS — Z66 Do not resuscitate: Secondary | ICD-10-CM | POA: Diagnosis present

## 2023-09-09 DIAGNOSIS — E11319 Type 2 diabetes mellitus with unspecified diabetic retinopathy without macular edema: Secondary | ICD-10-CM | POA: Diagnosis present

## 2023-09-09 DIAGNOSIS — N1831 Chronic kidney disease, stage 3a: Secondary | ICD-10-CM | POA: Diagnosis present

## 2023-09-09 DIAGNOSIS — J441 Chronic obstructive pulmonary disease with (acute) exacerbation: Secondary | ICD-10-CM | POA: Diagnosis present

## 2023-09-09 DIAGNOSIS — R627 Adult failure to thrive: Secondary | ICD-10-CM | POA: Diagnosis present

## 2023-09-09 DIAGNOSIS — H5702 Anisocoria: Secondary | ICD-10-CM | POA: Diagnosis not present

## 2023-09-09 DIAGNOSIS — I9589 Other hypotension: Secondary | ICD-10-CM | POA: Diagnosis not present

## 2023-09-09 DIAGNOSIS — I429 Cardiomyopathy, unspecified: Secondary | ICD-10-CM | POA: Diagnosis present

## 2023-09-09 DIAGNOSIS — Z87891 Personal history of nicotine dependence: Secondary | ICD-10-CM

## 2023-09-09 DIAGNOSIS — Z7982 Long term (current) use of aspirin: Secondary | ICD-10-CM

## 2023-09-09 DIAGNOSIS — Z8701 Personal history of pneumonia (recurrent): Secondary | ICD-10-CM

## 2023-09-09 DIAGNOSIS — E872 Acidosis, unspecified: Secondary | ICD-10-CM | POA: Diagnosis present

## 2023-09-09 DIAGNOSIS — I493 Ventricular premature depolarization: Secondary | ICD-10-CM | POA: Diagnosis not present

## 2023-09-09 DIAGNOSIS — U099 Post covid-19 condition, unspecified: Secondary | ICD-10-CM | POA: Diagnosis present

## 2023-09-09 DIAGNOSIS — E1151 Type 2 diabetes mellitus with diabetic peripheral angiopathy without gangrene: Secondary | ICD-10-CM | POA: Diagnosis present

## 2023-09-09 DIAGNOSIS — R059 Cough, unspecified: Secondary | ICD-10-CM | POA: Diagnosis not present

## 2023-09-09 DIAGNOSIS — Z89512 Acquired absence of left leg below knee: Secondary | ICD-10-CM

## 2023-09-09 DIAGNOSIS — Z794 Long term (current) use of insulin: Secondary | ICD-10-CM

## 2023-09-09 DIAGNOSIS — I214 Non-ST elevation (NSTEMI) myocardial infarction: Principal | ICD-10-CM | POA: Diagnosis present

## 2023-09-09 DIAGNOSIS — D72829 Elevated white blood cell count, unspecified: Secondary | ICD-10-CM | POA: Diagnosis present

## 2023-09-09 DIAGNOSIS — Z604 Social exclusion and rejection: Secondary | ICD-10-CM | POA: Diagnosis present

## 2023-09-09 DIAGNOSIS — E1152 Type 2 diabetes mellitus with diabetic peripheral angiopathy with gangrene: Secondary | ICD-10-CM

## 2023-09-09 DIAGNOSIS — E1140 Type 2 diabetes mellitus with diabetic neuropathy, unspecified: Secondary | ICD-10-CM | POA: Diagnosis present

## 2023-09-09 DIAGNOSIS — E11649 Type 2 diabetes mellitus with hypoglycemia without coma: Secondary | ICD-10-CM | POA: Diagnosis not present

## 2023-09-09 DIAGNOSIS — Z7722 Contact with and (suspected) exposure to environmental tobacco smoke (acute) (chronic): Secondary | ICD-10-CM | POA: Diagnosis present

## 2023-09-09 DIAGNOSIS — G8929 Other chronic pain: Secondary | ICD-10-CM | POA: Diagnosis present

## 2023-09-09 DIAGNOSIS — G4733 Obstructive sleep apnea (adult) (pediatric): Secondary | ICD-10-CM | POA: Diagnosis not present

## 2023-09-09 DIAGNOSIS — Z539 Procedure and treatment not carried out, unspecified reason: Secondary | ICD-10-CM | POA: Diagnosis not present

## 2023-09-09 DIAGNOSIS — W06XXXA Fall from bed, initial encounter: Secondary | ICD-10-CM | POA: Diagnosis not present

## 2023-09-09 DIAGNOSIS — Y92230 Patient room in hospital as the place of occurrence of the external cause: Secondary | ICD-10-CM | POA: Diagnosis not present

## 2023-09-09 DIAGNOSIS — I5021 Acute systolic (congestive) heart failure: Secondary | ICD-10-CM

## 2023-09-09 DIAGNOSIS — I2510 Atherosclerotic heart disease of native coronary artery without angina pectoris: Secondary | ICD-10-CM | POA: Diagnosis present

## 2023-09-09 DIAGNOSIS — Z881 Allergy status to other antibiotic agents status: Secondary | ICD-10-CM

## 2023-09-09 DIAGNOSIS — I10 Essential (primary) hypertension: Secondary | ICD-10-CM | POA: Diagnosis present

## 2023-09-09 DIAGNOSIS — E1122 Type 2 diabetes mellitus with diabetic chronic kidney disease: Secondary | ICD-10-CM | POA: Diagnosis present

## 2023-09-09 DIAGNOSIS — Z9049 Acquired absence of other specified parts of digestive tract: Secondary | ICD-10-CM

## 2023-09-09 DIAGNOSIS — Z993 Dependence on wheelchair: Secondary | ICD-10-CM

## 2023-09-09 DIAGNOSIS — R051 Acute cough: Secondary | ICD-10-CM

## 2023-09-09 DIAGNOSIS — B488 Other specified mycoses: Secondary | ICD-10-CM | POA: Diagnosis present

## 2023-09-09 DIAGNOSIS — Z888 Allergy status to other drugs, medicaments and biological substances status: Secondary | ICD-10-CM

## 2023-09-09 DIAGNOSIS — Z79899 Other long term (current) drug therapy: Secondary | ICD-10-CM

## 2023-09-09 DIAGNOSIS — S50311A Abrasion of right elbow, initial encounter: Secondary | ICD-10-CM | POA: Diagnosis not present

## 2023-09-09 DIAGNOSIS — Z801 Family history of malignant neoplasm of trachea, bronchus and lung: Secondary | ICD-10-CM

## 2023-09-09 DIAGNOSIS — J9601 Acute respiratory failure with hypoxia: Principal | ICD-10-CM | POA: Diagnosis present

## 2023-09-09 DIAGNOSIS — K5900 Constipation, unspecified: Secondary | ICD-10-CM | POA: Diagnosis not present

## 2023-09-09 DIAGNOSIS — D631 Anemia in chronic kidney disease: Secondary | ICD-10-CM | POA: Diagnosis present

## 2023-09-09 DIAGNOSIS — R092 Respiratory arrest: Secondary | ICD-10-CM | POA: Diagnosis not present

## 2023-09-09 DIAGNOSIS — Z6832 Body mass index (BMI) 32.0-32.9, adult: Secondary | ICD-10-CM

## 2023-09-09 DIAGNOSIS — Z885 Allergy status to narcotic agent status: Secondary | ICD-10-CM

## 2023-09-09 DIAGNOSIS — Z86718 Personal history of other venous thrombosis and embolism: Secondary | ICD-10-CM

## 2023-09-09 DIAGNOSIS — B3749 Other urogenital candidiasis: Secondary | ICD-10-CM | POA: Diagnosis present

## 2023-09-09 DIAGNOSIS — F419 Anxiety disorder, unspecified: Secondary | ICD-10-CM | POA: Diagnosis present

## 2023-09-09 LAB — BASIC METABOLIC PANEL WITH GFR
Anion gap: 11 (ref 5–15)
BUN: 13 mg/dL (ref 8–23)
CO2: 22 mmol/L (ref 22–32)
Calcium: 8.6 mg/dL — ABNORMAL LOW (ref 8.9–10.3)
Chloride: 106 mmol/L (ref 98–111)
Creatinine, Ser: 1.19 mg/dL — ABNORMAL HIGH (ref 0.44–1.00)
GFR, Estimated: 50 mL/min — ABNORMAL LOW (ref >60)
Glucose, Bld: 179 mg/dL — ABNORMAL HIGH (ref 70–99)
Potassium: 4.2 mmol/L (ref 3.5–5.1)
Sodium: 139 mmol/L (ref 135–145)

## 2023-09-09 LAB — CBC WITH DIFFERENTIAL/PLATELET
Abs Immature Granulocytes: 0.03 10*3/uL (ref 0.00–0.07)
Basophils Absolute: 0.1 10*3/uL (ref 0.0–0.1)
Basophils Relative: 1 %
Eosinophils Absolute: 0.5 10*3/uL (ref 0.0–0.5)
Eosinophils Relative: 4 %
HCT: 33.3 % — ABNORMAL LOW (ref 36.0–46.0)
Hemoglobin: 9.8 g/dL — ABNORMAL LOW (ref 12.0–15.0)
Immature Granulocytes: 0 %
Lymphocytes Relative: 29 %
Lymphs Abs: 3.3 10*3/uL (ref 0.7–4.0)
MCH: 28.1 pg (ref 26.0–34.0)
MCHC: 29.4 g/dL — ABNORMAL LOW (ref 30.0–36.0)
MCV: 95.4 fL (ref 80.0–100.0)
Monocytes Absolute: 1.1 10*3/uL — ABNORMAL HIGH (ref 0.1–1.0)
Monocytes Relative: 10 %
Neutro Abs: 6.3 10*3/uL (ref 1.7–7.7)
Neutrophils Relative %: 56 %
Platelets: 489 10*3/uL — ABNORMAL HIGH (ref 150–400)
RBC: 3.49 MIL/uL — ABNORMAL LOW (ref 3.87–5.11)
RDW: 15.6 % — ABNORMAL HIGH (ref 11.5–15.5)
WBC: 11.4 10*3/uL — ABNORMAL HIGH (ref 4.0–10.5)
nRBC: 0 % (ref 0.0–0.2)

## 2023-09-09 MED ORDER — HYDROCODONE BIT-HOMATROP MBR 5-1.5 MG/5ML PO SOLN
5.0000 mL | Freq: Once | ORAL | Status: AC
  Administered 2023-09-09: 5 mL via ORAL
  Filled 2023-09-09: qty 5

## 2023-09-09 MED ORDER — BENZONATATE 100 MG PO CAPS
200.0000 mg | ORAL_CAPSULE | Freq: Once | ORAL | Status: AC
  Administered 2023-09-09: 200 mg via ORAL
  Filled 2023-09-09: qty 2

## 2023-09-09 MED ORDER — DEXAMETHASONE SODIUM PHOSPHATE 10 MG/ML IJ SOLN
10.0000 mg | Freq: Once | INTRAMUSCULAR | Status: AC
  Administered 2023-09-09: 10 mg via INTRAVENOUS
  Filled 2023-09-09: qty 1

## 2023-09-09 MED ORDER — IPRATROPIUM-ALBUTEROL 0.5-2.5 (3) MG/3ML IN SOLN
3.0000 mL | Freq: Once | RESPIRATORY_TRACT | Status: AC
  Administered 2023-09-09: 3 mL via RESPIRATORY_TRACT
  Filled 2023-09-09: qty 3

## 2023-09-09 MED ORDER — ALBUTEROL SULFATE HFA 108 (90 BASE) MCG/ACT IN AERS
2.0000 | INHALATION_SPRAY | Freq: Once | RESPIRATORY_TRACT | Status: AC
  Administered 2023-09-09: 2 via RESPIRATORY_TRACT
  Filled 2023-09-09: qty 6.7

## 2023-09-09 NOTE — ED Triage Notes (Addendum)
 Pt having shob after coughing. Denies CP. Pt had COVID 2 weeks ago. Hx of COPD. 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

## 2023-09-09 NOTE — ED Provider Notes (Signed)
 [place] EMERGENCY DEPARTMENT AT [facility] Provider Note   CSN: [id number] Arrival date & time: 09/09/23  1652     Patient presents with: Cough   Anna [name] is a 68 y.o. female.  {Add pertinent medical, surgical, social history, OB history to WUJ:[id number]} The history is provided by the patient and medical records.  Cough  68 year old female with history of anxiety, dyslipidemia, hyperlipidemia, prior stroke, COPD, hypertension, GERD, sleep apnea, peripheral arterial disease, diabetes, presenting to the ED with shortness of breath.  Patient diagnosed with COVID 2 weeks ago, completed course of molnupiravir as an outpatient.  States she felt like she got better for just a few days but over the past 4 days or so she has been absolutely miserable.  States she is unable to lay flat and cannot stop coughing.  Having to stay completely upright or else she coughs all night and cannot breath.  Still coughing up some clear phlegm. Occasionally has some post-tussive emesis.  Home health checked on her today and concerned for her appearance so brought in for evaluation.  She denies any chest pain.  Barely eating solids and feels incredibly weak.  Also overdue for ureteral stent exchange due to illness.  Denies and significant flank pain or fever.  Also has some swelling of her right foot that is new this week.  Denies calf pain.  Prior to Admission medications   Medication Sig Start Date End Date Taking? Authorizing Provider  acetaminophen  (TYLENOL ) 500 MG tablet Take 1,000-1,500 mg by mouth every 8 (eight) hours as needed for moderate pain (pain score 4-6).    [provider]  albuterol  (VENTOLIN  HFA) 108 (90 Base) MCG/ACT inhaler Inhale 1-2 puffs into the lungs every 6 (six) hours as needed for wheezing or shortness of breath. 10/04/22   Singh, Prashant K, MD  Apoaequorin (PREVAGEN) 10 MG CAPS Take 10 mg by mouth daily.    [provider]  aspirin  EC 81 MG tablet Take  81 mg by mouth daily. Swallow whole.    [provider]  Aspirin -Acetaminophen -Caffeine (GOODY HEADACHE PO) Take 2 packets by mouth daily as needed (headaches).    [provider]  atorvastatin  (LIPITOR ) 80 MG tablet Take 1 tablet (80 mg total) by mouth at bedtime. 06/04/23   Regalado, Belkys A, MD  benzonatate  (TESSALON ) 100 MG capsule Take 100 mg by mouth 3 (three) times daily as needed for cough.    [provider]  busPIRone  (BUSPAR ) 7.5 MG tablet Take 7.5 mg by mouth 2 (two) times daily.    [provider]  diclofenac  Sodium (VOLTAREN ) 1 % GEL Apply 4 g topically as needed (joint pain, phantom joint pain).    [provider]  Dulaglutide 3 MG/0.5ML SOPN Inject 3 mg into the skin every Saturday.    [provider]  estradiol (ESTRACE) 0.1 MG/GM vaginal cream Place 1 Applicatorful vaginally as needed (dryness / irritation).    [provider]  FLUoxetine  (PROZAC ) 40 MG capsule Take 40 mg by mouth daily.    [provider]  fluticasone (FLONASE) 50 MCG/ACT nasal spray Place 1 spray into both nostrils daily as needed for allergies or rhinitis.    [provider]  gabapentin  (NEURONTIN ) 400 MG capsule Take 400 mg by mouth 3 (three) times daily.    [provider]  hydrOXYzine  (ATARAX ) 10 MG tablet Take 1 tablet (10 mg total) by mouth 2 (two) times daily. Patient taking differently: Take 10 mg by mouth  daily. 10/13/21   Samtani, Jai-Gurmukh, MD  insulin  aspart (NOVOLOG ) 100 UNIT/ML FlexPen Use as directed before each meal 3 times a day: 140-199 - 2 units, 200-250 - 4 units, 251-299 - 6 units,  300-349 - 8 units,  350 or above 10 units. Patient taking differently: Inject 2-6 Units into the skin 3 (three) times daily as needed for high blood sugar. 10/04/22   Singh, Prashant K, MD  insulin  glargine, 1 Unit Dial , (TOUJEO  SOLOSTAR) 300 UNIT/ML Solostar Pen Inject 10 Units into the skin at bedtime. 06/04/23   Regalado,  Belkys A, MD  ipratropium-albuterol  (DUONEB) 0.5-2.5 (3) MG/3ML SOLN Use 3 ml by nebulization twice a day scheduled and every 4 hours as needed for shortness of breath and wheezing Patient taking differently: Take 3 mLs by nebulization daily as needed (SOB and wheezing). Use 3 ml by nebulization twice a day scheduled and every 4 hours as needed for shortness of breath and wheezing 10/04/22   Singh, Prashant K, MD  Lactobacillus Rhamnosus, GG, (CULTURELLE HEALTH & WELLNESS) CAPS Take 1 capsule by mouth daily. 08/05/21   [provider]  lidocaine  4 % Place 1 patch onto the skin daily as needed (pain).    [provider]  lisinopril  (ZESTRIL ) 5 MG tablet Take 5 mg by mouth at bedtime.    [provider]  loratadine  (CLARITIN ) 10 MG tablet Take 10 mg by mouth daily as needed for allergies.    [provider]  meclizine (ANTIVERT) 25 MG tablet Take 25 mg by mouth daily as needed for dizziness. 10/01/19   [provider]  Menthol, Topical Analgesic, (BIOFREEZE EX) Apply 1 Application topically daily as needed (neck and shoulder pain).    [provider]  metFORMIN  (GLUCOPHAGE ) 1000 MG tablet Take 1,000 mg by mouth 2 (two) times daily with a meal.    [provider]  nystatin  (MYCOSTATIN ) 100000 UNIT/ML suspension Take 1 mL by mouth 4 (four) times daily as needed (red gums).    [provider]  nystatin  cream (MYCOSTATIN ) Apply 1 application. topically 4 (four) times daily as needed for dry skin.    [provider]  ondansetron  (ZOFRAN -ODT) 4 MG disintegrating tablet Take 4 mg by mouth every 8 (eight) hours as needed for nausea or vomiting.    [provider]  ondansetron  (ZOFRAN -ODT) 4 MG disintegrating tablet Take 1 tablet (4 mg total) by mouth every 8 (eight) hours as needed for nausea or vomiting. 08/08/23   Carin Charleston, MD  pantoprazole  (PROTONIX ) 40 MG tablet Take 40 mg by mouth 2 (two) times daily.    [provider]  rOPINIRole  (REQUIP ) 0.25 MG tablet Take 0.25 mg by mouth at bedtime.    [provider]  sennosides-docusate sodium  (SENOKOT-S) 8.6-50 MG tablet Take 1 tablet by mouth 2 (two) times daily as needed for constipation.    [provider]  tamsulosin  (FLOMAX ) 0.4 MG CAPS capsule Take 1 capsule (0.4 mg total) by mouth daily. 10/04/22   Singh, Prashant K, MD  topiramate  (TOPAMAX ) 50 MG tablet Take 1 tablet (50 mg total) by mouth 2 (two) times daily. 06/05/23   Rai, Hurman Maiden, MD    Allergies: Lasix [furosemide], Bacitracin, Citalopram, Doxycycline , Morphine and codeine, Neomycin, Ciprofloxacin , Ciprofloxacin -dexamethasone , Duloxetine, Latex, Levaquin [levofloxacin in d5w], Other, Salvia officinalis, and Soma [carisoprodol]    Review of Systems  Respiratory:  Positive for cough.   All other systems reviewed and are negative.   Updated Vital Signs BP Aaron Aas)  145/54 (BP Location: Right Arm)   Pulse (!) 105   Temp (!) 97.4 F (36.3 C) (Temporal)   Resp 20   Ht 5' 2 (1.575 m)   Wt 72.6 kg   SpO2 100%   BMI 29.26 kg/m   Physical Exam Vitals and nursing note reviewed.  Constitutional:      Appearance: She is well-developed.  HENT:     Head: Normocephalic and atraumatic.   Eyes:     Conjunctiva/sclera: Conjunctivae normal.     Pupils: Pupils are equal, round, and reactive to light.    Cardiovascular:     Rate and Rhythm: Normal rate and regular rhythm.     Heart sounds: Normal heart sounds.  Pulmonary:     Effort: Pulmonary effort is normal.     Breath sounds: Normal breath sounds.     Comments: Continuous cough throughout exam, does produce some clear mucus, currently on 2L, speaking in a whisper only Abdominal:     General: Bowel sounds are normal.     Palpations: Abdomen is soft.   Musculoskeletal:        General: Normal range of motion.     Cervical back: Normal range of motion.     Comments: Mild swelling of right ankle/foot, no calf  tenderness, no erythema/induration, DP pulse intact Left DKA   Skin:    General: Skin is warm and dry.   Neurological:     Mental Status: She is alert and oriented to person, place, and time.     (all labs ordered are listed, but only abnormal results are displayed) Labs Reviewed  BASIC METABOLIC PANEL WITH GFR - Abnormal; Notable for the following components:      Result Value   Glucose, Bld 179 (*)    Creatinine, Ser 1.19 (*)    Calcium  8.6 (*)    GFR, Estimated 50 (*)    All other components within normal limits  CBC WITH DIFFERENTIAL/PLATELET - Abnormal; Notable for the following components:   WBC 11.4 (*)    RBC 3.49 (*)    Hemoglobin 9.8 (*)    HCT 33.3 (*)    MCHC 29.4 (*)    RDW 15.6 (*)    Platelets 489 (*)    Monocytes Absolute 1.1 (*)    All other components within normal limits  URINALYSIS, W/ REFLEX TO CULTURE (INFECTION SUSPECTED)    EKG: None  Radiology: DG Chest 2 View Result Date: 09/09/2023 EXAM: 2 VIEW(S) XRAY OF THE CHEST 09/09/2023 07:55:00 PM COMPARISON: 08/08/2023 CLINICAL HISTORY: Cough. SOB, cough for over 2 weeks. History of COPD. FINDINGS: LUNGS AND PLEURA: No focal pulmonary opacity. No pulmonary edema. No pleural effusion. No pneumothorax. HEART AND MEDIASTINUM: No acute abnormality of the cardiac and mediastinal silhouettes. BONES AND SOFT TISSUES: Mild degenerative changes of the mid/lower thoracic spine. Cholecystectomy clips. No acute osseous abnormality. IMPRESSION: 1. No acute process. Electronically signed by: Zadie Herter MD 09/09/2023 08:02 PM EDT RP Workstation: [id number]    {Document cardiac monitor, telemetry assessment procedure when appropriate:32947} Procedures   Medications Ordered in the ED  ipratropium-albuterol  (DUONEB) 0.5-2.5 (3) MG/3ML nebulizer solution 3 mL (has no administration in time range)  HYDROcodone  bit-homatropine (HYCODAN) 5-1.5 MG/5ML syrup 5 mL (has no administration in time range)  albuterol   (VENTOLIN  HFA) 108 (90 Base) MCG/ACT inhaler 2 puff (2 puffs Inhalation Given 09/09/23 1930)  benzonatate  (TESSALON ) capsule 200 mg (200 mg Oral Given 09/09/23 1930)      {Click here for ABCD2, HEART and  other calculators REFRESH Note before signing:1}                              Medical Decision Making Amount and/or Complexity of Data Reviewed Radiology: ordered and independent interpretation performed. ECG/medicine tests: ordered and independent interpretation performed.  Risk Prescription drug management.   68 year old female here with shortness of breath.  Recent COVID infection with ongoing symptoms.  Has been requiring 2 L since around 3 PM today.  She is not generally oxygen dependent.  Home health RN concerned for appearance today at home visit.  She is afebrile but appears unwell on exam.  She is continuously coughing and does appear winded.  She is having to speak in a whisper and has to sit fully upright.  She is hemodynamically stable on 2 L.  She does appear weak.  States she just started eating solid food about 2 days ago but has had some posttussive emesis.  Labs today with white count of 11.4.  No significant electrolyte derangement.  Chest x-ray without any acute findings.  Given decadron  and duonebs here along with hycodan as no relief with tessalon  perles earlier today.  With new O2 requirement, will require admission.  Final diagnoses:  None    ED Discharge Orders     None

## 2023-09-10 ENCOUNTER — Encounter (HOSPITAL_COMMUNITY): Payer: Self-pay | Admitting: Family Medicine

## 2023-09-10 ENCOUNTER — Inpatient Hospital Stay (HOSPITAL_COMMUNITY): Payer: Medicare (Managed Care)

## 2023-09-10 DIAGNOSIS — I5043 Acute on chronic combined systolic (congestive) and diastolic (congestive) heart failure: Secondary | ICD-10-CM | POA: Diagnosis not present

## 2023-09-10 DIAGNOSIS — R0609 Other forms of dyspnea: Secondary | ICD-10-CM | POA: Diagnosis not present

## 2023-09-10 DIAGNOSIS — E1140 Type 2 diabetes mellitus with diabetic neuropathy, unspecified: Secondary | ICD-10-CM | POA: Diagnosis present

## 2023-09-10 DIAGNOSIS — J9601 Acute respiratory failure with hypoxia: Secondary | ICD-10-CM | POA: Diagnosis present

## 2023-09-10 DIAGNOSIS — W06XXXA Fall from bed, initial encounter: Secondary | ICD-10-CM | POA: Diagnosis not present

## 2023-09-10 DIAGNOSIS — I214 Non-ST elevation (NSTEMI) myocardial infarction: Secondary | ICD-10-CM | POA: Diagnosis present

## 2023-09-10 DIAGNOSIS — R059 Cough, unspecified: Secondary | ICD-10-CM | POA: Diagnosis present

## 2023-09-10 DIAGNOSIS — R609 Edema, unspecified: Secondary | ICD-10-CM | POA: Diagnosis not present

## 2023-09-10 DIAGNOSIS — I2510 Atherosclerotic heart disease of native coronary artery without angina pectoris: Secondary | ICD-10-CM | POA: Diagnosis not present

## 2023-09-10 DIAGNOSIS — F32A Depression, unspecified: Secondary | ICD-10-CM | POA: Diagnosis present

## 2023-09-10 DIAGNOSIS — I5023 Acute on chronic systolic (congestive) heart failure: Secondary | ICD-10-CM | POA: Diagnosis not present

## 2023-09-10 DIAGNOSIS — Y92230 Patient room in hospital as the place of occurrence of the external cause: Secondary | ICD-10-CM | POA: Diagnosis not present

## 2023-09-10 DIAGNOSIS — E1122 Type 2 diabetes mellitus with diabetic chronic kidney disease: Secondary | ICD-10-CM | POA: Diagnosis present

## 2023-09-10 DIAGNOSIS — Z8673 Personal history of transient ischemic attack (TIA), and cerebral infarction without residual deficits: Secondary | ICD-10-CM | POA: Diagnosis not present

## 2023-09-10 DIAGNOSIS — G4733 Obstructive sleep apnea (adult) (pediatric): Secondary | ICD-10-CM | POA: Diagnosis not present

## 2023-09-10 DIAGNOSIS — E1165 Type 2 diabetes mellitus with hyperglycemia: Secondary | ICD-10-CM | POA: Diagnosis present

## 2023-09-10 DIAGNOSIS — I6503 Occlusion and stenosis of bilateral vertebral arteries: Secondary | ICD-10-CM | POA: Diagnosis present

## 2023-09-10 DIAGNOSIS — B488 Other specified mycoses: Secondary | ICD-10-CM | POA: Diagnosis present

## 2023-09-10 DIAGNOSIS — I5021 Acute systolic (congestive) heart failure: Secondary | ICD-10-CM | POA: Diagnosis not present

## 2023-09-10 DIAGNOSIS — E1152 Type 2 diabetes mellitus with diabetic peripheral angiopathy with gangrene: Secondary | ICD-10-CM | POA: Diagnosis not present

## 2023-09-10 DIAGNOSIS — I429 Cardiomyopathy, unspecified: Secondary | ICD-10-CM | POA: Diagnosis present

## 2023-09-10 DIAGNOSIS — J4521 Mild intermittent asthma with (acute) exacerbation: Secondary | ICD-10-CM | POA: Diagnosis present

## 2023-09-10 DIAGNOSIS — I252 Old myocardial infarction: Secondary | ICD-10-CM | POA: Diagnosis not present

## 2023-09-10 DIAGNOSIS — I25112 Atherosclerotic heart disease of native coronary artery with refractory angina pectoris: Secondary | ICD-10-CM | POA: Diagnosis not present

## 2023-09-10 DIAGNOSIS — I5020 Unspecified systolic (congestive) heart failure: Secondary | ICD-10-CM | POA: Diagnosis not present

## 2023-09-10 DIAGNOSIS — I739 Peripheral vascular disease, unspecified: Secondary | ICD-10-CM | POA: Diagnosis not present

## 2023-09-10 DIAGNOSIS — J449 Chronic obstructive pulmonary disease, unspecified: Secondary | ICD-10-CM | POA: Diagnosis not present

## 2023-09-10 DIAGNOSIS — Z87891 Personal history of nicotine dependence: Secondary | ICD-10-CM | POA: Diagnosis not present

## 2023-09-10 DIAGNOSIS — J4520 Mild intermittent asthma, uncomplicated: Secondary | ICD-10-CM | POA: Diagnosis not present

## 2023-09-10 DIAGNOSIS — Z515 Encounter for palliative care: Secondary | ICD-10-CM | POA: Diagnosis not present

## 2023-09-10 DIAGNOSIS — I129 Hypertensive chronic kidney disease with stage 1 through stage 4 chronic kidney disease, or unspecified chronic kidney disease: Secondary | ICD-10-CM | POA: Diagnosis not present

## 2023-09-10 DIAGNOSIS — B3749 Other urogenital candidiasis: Secondary | ICD-10-CM | POA: Diagnosis present

## 2023-09-10 DIAGNOSIS — N1831 Chronic kidney disease, stage 3a: Secondary | ICD-10-CM | POA: Diagnosis present

## 2023-09-10 DIAGNOSIS — J441 Chronic obstructive pulmonary disease with (acute) exacerbation: Secondary | ICD-10-CM | POA: Diagnosis present

## 2023-09-10 DIAGNOSIS — G2581 Restless legs syndrome: Secondary | ICD-10-CM | POA: Diagnosis present

## 2023-09-10 DIAGNOSIS — D631 Anemia in chronic kidney disease: Secondary | ICD-10-CM | POA: Diagnosis present

## 2023-09-10 DIAGNOSIS — H469 Unspecified optic neuritis: Secondary | ICD-10-CM | POA: Diagnosis not present

## 2023-09-10 DIAGNOSIS — Z01810 Encounter for preprocedural cardiovascular examination: Secondary | ICD-10-CM | POA: Diagnosis not present

## 2023-09-10 DIAGNOSIS — H579 Unspecified disorder of eye and adnexa: Secondary | ICD-10-CM | POA: Diagnosis not present

## 2023-09-10 DIAGNOSIS — N179 Acute kidney failure, unspecified: Secondary | ICD-10-CM | POA: Diagnosis present

## 2023-09-10 DIAGNOSIS — I2582 Chronic total occlusion of coronary artery: Secondary | ICD-10-CM | POA: Diagnosis not present

## 2023-09-10 DIAGNOSIS — E872 Acidosis, unspecified: Secondary | ICD-10-CM | POA: Diagnosis present

## 2023-09-10 DIAGNOSIS — Z6832 Body mass index (BMI) 32.0-32.9, adult: Secondary | ICD-10-CM | POA: Diagnosis not present

## 2023-09-10 DIAGNOSIS — Z7189 Other specified counseling: Secondary | ICD-10-CM | POA: Diagnosis not present

## 2023-09-10 DIAGNOSIS — R053 Chronic cough: Secondary | ICD-10-CM | POA: Diagnosis not present

## 2023-09-10 DIAGNOSIS — I959 Hypotension, unspecified: Secondary | ICD-10-CM | POA: Diagnosis not present

## 2023-09-10 DIAGNOSIS — Z794 Long term (current) use of insulin: Secondary | ICD-10-CM | POA: Diagnosis not present

## 2023-09-10 DIAGNOSIS — R092 Respiratory arrest: Secondary | ICD-10-CM | POA: Diagnosis not present

## 2023-09-10 DIAGNOSIS — Z66 Do not resuscitate: Secondary | ICD-10-CM | POA: Diagnosis present

## 2023-09-10 DIAGNOSIS — I130 Hypertensive heart and chronic kidney disease with heart failure and stage 1 through stage 4 chronic kidney disease, or unspecified chronic kidney disease: Secondary | ICD-10-CM | POA: Diagnosis present

## 2023-09-10 DIAGNOSIS — I25118 Atherosclerotic heart disease of native coronary artery with other forms of angina pectoris: Secondary | ICD-10-CM | POA: Diagnosis not present

## 2023-09-10 LAB — URINALYSIS, W/ REFLEX TO CULTURE (INFECTION SUSPECTED)
Bilirubin Urine: NEGATIVE
Glucose, UA: NEGATIVE mg/dL
Ketones, ur: NEGATIVE mg/dL
Nitrite: NEGATIVE
Protein, ur: NEGATIVE mg/dL
Specific Gravity, Urine: 1.008 (ref 1.005–1.030)
WBC, UA: >50 WBC/hpf (ref 0–5)
pH: 5 (ref 5.0–8.0)

## 2023-09-10 LAB — BASIC METABOLIC PANEL WITH GFR
Anion gap: 12 (ref 5–15)
BUN: 14 mg/dL (ref 8–23)
CO2: 17 mmol/L — ABNORMAL LOW (ref 22–32)
Calcium: 8.2 mg/dL — ABNORMAL LOW (ref 8.9–10.3)
Chloride: 106 mmol/L (ref 98–111)
Creatinine, Ser: 1.24 mg/dL — ABNORMAL HIGH (ref 0.44–1.00)
GFR, Estimated: 47 mL/min — ABNORMAL LOW (ref >60)
Glucose, Bld: 404 mg/dL — ABNORMAL HIGH (ref 70–99)
Potassium: 3.8 mmol/L (ref 3.5–5.1)
Sodium: 135 mmol/L (ref 135–145)

## 2023-09-10 LAB — URINE CULTURE

## 2023-09-10 LAB — CBG MONITORING, ED
Glucose-Capillary: 299 mg/dL — ABNORMAL HIGH (ref 70–99)
Glucose-Capillary: 345 mg/dL — ABNORMAL HIGH (ref 70–99)

## 2023-09-10 LAB — GLUCOSE, CAPILLARY
Glucose-Capillary: 335 mg/dL — ABNORMAL HIGH (ref 70–99)
Glucose-Capillary: 274 mg/dL — ABNORMAL HIGH (ref 70–99)
Glucose-Capillary: 251 mg/dL — ABNORMAL HIGH (ref 70–99)

## 2023-09-10 LAB — CBC
HCT: 32.2 % — ABNORMAL LOW (ref 36.0–46.0)
Hemoglobin: 9.6 g/dL — ABNORMAL LOW (ref 12.0–15.0)
MCH: 28.1 pg (ref 26.0–34.0)
MCHC: 29.8 g/dL — ABNORMAL LOW (ref 30.0–36.0)
MCV: 94.2 fL (ref 80.0–100.0)
Platelets: 430 10*3/uL — ABNORMAL HIGH (ref 150–400)
RBC: 3.42 MIL/uL — ABNORMAL LOW (ref 3.87–5.11)
RDW: 15.4 % (ref 11.5–15.5)
WBC: 11.4 10*3/uL — ABNORMAL HIGH (ref 4.0–10.5)
nRBC: 0 % (ref 0.0–0.2)

## 2023-09-10 LAB — C-REACTIVE PROTEIN: CRP: 0.5 mg/dL (ref <1.0)

## 2023-09-10 LAB — PROCALCITONIN: Procalcitonin: <0.1 ng/mL

## 2023-09-10 MED ORDER — METHYLPREDNISOLONE SODIUM SUCC 125 MG IJ SOLR
125.0000 mg | Freq: Two times a day (BID) | INTRAMUSCULAR | Status: AC
  Administered 2023-09-10 (×2): 125 mg via INTRAVENOUS
  Filled 2023-09-10 (×2): qty 2

## 2023-09-10 MED ORDER — INSULIN ASPART 100 UNIT/ML IJ SOLN
0.0000 [IU] | Freq: Every day | INTRAMUSCULAR | Status: DC
  Administered 2023-09-10: 3 [IU] via SUBCUTANEOUS
  Administered 2023-09-11: 4 [IU] via SUBCUTANEOUS
  Administered 2023-09-13: 2 [IU] via SUBCUTANEOUS
  Administered 2023-09-21: 3 [IU] via SUBCUTANEOUS

## 2023-09-10 MED ORDER — PANTOPRAZOLE SODIUM 40 MG PO TBEC
40.0000 mg | DELAYED_RELEASE_TABLET | Freq: Two times a day (BID) | ORAL | Status: DC
  Administered 2023-09-10 – 2023-09-23 (×26): 40 mg via ORAL
  Filled 2023-09-10 (×26): qty 1

## 2023-09-10 MED ORDER — IPRATROPIUM-ALBUTEROL 0.5-2.5 (3) MG/3ML IN SOLN
3.0000 mL | RESPIRATORY_TRACT | Status: DC | PRN
  Administered 2023-09-10 – 2023-09-15 (×15): 3 mL via RESPIRATORY_TRACT
  Filled 2023-09-10 (×15): qty 3

## 2023-09-10 MED ORDER — FLUCONAZOLE 150 MG PO TABS
150.0000 mg | ORAL_TABLET | Freq: Once | ORAL | Status: AC
  Administered 2023-09-10: 150 mg via ORAL
  Filled 2023-09-10: qty 1

## 2023-09-10 MED ORDER — POLYETHYLENE GLYCOL 3350 17 G PO PACK: 17.0000 g | PACK | Freq: Every day | ORAL | Status: DC | PRN

## 2023-09-10 MED ORDER — INSULIN ASPART 100 UNIT/ML IJ SOLN
0.0000 [IU] | Freq: Every day | INTRAMUSCULAR | Status: DC
  Administered 2023-09-10: 3 [IU] via SUBCUTANEOUS

## 2023-09-10 MED ORDER — PROCHLORPERAZINE EDISYLATE 10 MG/2ML IJ SOLN
5.0000 mg | Freq: Four times a day (QID) | INTRAMUSCULAR | Status: DC | PRN
  Administered 2023-09-10 – 2023-09-19 (×3): 5 mg via INTRAVENOUS
  Filled 2023-09-10 (×3): qty 2

## 2023-09-10 MED ORDER — FERROUS SULFATE 325 (65 FE) MG PO TABS
325.0000 mg | ORAL_TABLET | Freq: Every day | ORAL | Status: DC
  Administered 2023-09-12 – 2023-09-23 (×12): 325 mg via ORAL
  Filled 2023-09-10 (×14): qty 1

## 2023-09-10 MED ORDER — BENZONATATE 100 MG PO CAPS
100.0000 mg | ORAL_CAPSULE | Freq: Three times a day (TID) | ORAL | Status: DC
  Administered 2023-09-10 – 2023-09-23 (×38): 100 mg via ORAL
  Filled 2023-09-10 (×38): qty 1

## 2023-09-10 MED ORDER — ACETAMINOPHEN 325 MG PO TABS
650.0000 mg | ORAL_TABLET | Freq: Four times a day (QID) | ORAL | Status: DC | PRN
  Administered 2023-09-10 – 2023-09-18 (×18): 650 mg via ORAL
  Filled 2023-09-10 (×18): qty 2

## 2023-09-10 MED ORDER — TAMSULOSIN HCL 0.4 MG PO CAPS
0.4000 mg | ORAL_CAPSULE | Freq: Every day | ORAL | Status: DC
  Administered 2023-09-10 – 2023-09-23 (×14): 0.4 mg via ORAL
  Filled 2023-09-10 (×14): qty 1

## 2023-09-10 MED ORDER — BUSPIRONE HCL 5 MG PO TABS
7.5000 mg | ORAL_TABLET | Freq: Two times a day (BID) | ORAL | Status: DC
  Administered 2023-09-10 – 2023-09-23 (×26): 7.5 mg via ORAL
  Filled 2023-09-10 (×23): qty 2
  Filled 2023-09-10 (×2): qty 1
  Filled 2023-09-10 (×3): qty 2

## 2023-09-10 MED ORDER — INSULIN ASPART 100 UNIT/ML IJ SOLN
0.0000 [IU] | Freq: Three times a day (TID) | INTRAMUSCULAR | Status: DC
  Administered 2023-09-10: 11 [IU] via SUBCUTANEOUS
  Administered 2023-09-11: 4 [IU] via SUBCUTANEOUS
  Administered 2023-09-11: 15 [IU] via SUBCUTANEOUS
  Administered 2023-09-12: 4 [IU] via SUBCUTANEOUS
  Administered 2023-09-12: 11 [IU] via SUBCUTANEOUS
  Administered 2023-09-12 – 2023-09-13 (×3): 4 [IU] via SUBCUTANEOUS
  Administered 2023-09-14: 11 [IU] via SUBCUTANEOUS
  Administered 2023-09-14: 4 [IU] via SUBCUTANEOUS
  Administered 2023-09-14: 3 [IU] via SUBCUTANEOUS
  Administered 2023-09-15: 4 [IU] via SUBCUTANEOUS
  Administered 2023-09-15: 3 [IU] via SUBCUTANEOUS
  Administered 2023-09-15: 4 [IU] via SUBCUTANEOUS
  Administered 2023-09-16: 3 [IU] via SUBCUTANEOUS
  Administered 2023-09-17: 4 [IU] via SUBCUTANEOUS
  Administered 2023-09-18 – 2023-09-20 (×2): 3 [IU] via SUBCUTANEOUS
  Administered 2023-09-21: 4 [IU] via SUBCUTANEOUS
  Administered 2023-09-21 – 2023-09-22 (×2): 3 [IU] via SUBCUTANEOUS
  Administered 2023-09-22: 15 [IU] via SUBCUTANEOUS
  Administered 2023-09-22: 3 [IU] via SUBCUTANEOUS

## 2023-09-10 MED ORDER — ATORVASTATIN CALCIUM 80 MG PO TABS
80.0000 mg | ORAL_TABLET | Freq: Every day | ORAL | Status: DC
  Administered 2023-09-10 – 2023-09-22 (×13): 80 mg via ORAL
  Filled 2023-09-10 (×13): qty 1

## 2023-09-10 MED ORDER — GUAIFENESIN-CODEINE 100-10 MG/5ML PO SOLN
5.0000 mL | Freq: Four times a day (QID) | ORAL | Status: DC | PRN
  Administered 2023-09-10: 5 mL via ORAL
  Filled 2023-09-10 (×2): qty 5

## 2023-09-10 MED ORDER — ENOXAPARIN SODIUM 40 MG/0.4ML IJ SOSY
40.0000 mg | PREFILLED_SYRINGE | Freq: Every day | INTRAMUSCULAR | Status: DC
  Administered 2023-09-10 – 2023-09-12 (×3): 40 mg via SUBCUTANEOUS
  Filled 2023-09-10 (×3): qty 0.4

## 2023-09-10 MED ORDER — GABAPENTIN 400 MG PO CAPS
400.0000 mg | ORAL_CAPSULE | Freq: Three times a day (TID) | ORAL | Status: DC
  Administered 2023-09-10 – 2023-09-23 (×37): 400 mg via ORAL
  Filled 2023-09-10 (×38): qty 1

## 2023-09-10 MED ORDER — PREDNISONE 20 MG PO TABS
40.0000 mg | ORAL_TABLET | Freq: Every day | ORAL | Status: DC
Start: 2023-09-11 — End: 2023-09-11
  Administered 2023-09-11: 40 mg via ORAL
  Filled 2023-09-10: qty 2

## 2023-09-10 MED ORDER — HYDROXYZINE HCL 10 MG PO TABS
10.0000 mg | ORAL_TABLET | Freq: Three times a day (TID) | ORAL | Status: DC | PRN
  Administered 2023-09-11 – 2023-09-22 (×10): 10 mg via ORAL
  Filled 2023-09-10 (×11): qty 1

## 2023-09-10 MED ORDER — FLUOXETINE HCL 20 MG PO CAPS
40.0000 mg | ORAL_CAPSULE | Freq: Every day | ORAL | Status: DC
  Administered 2023-09-10 – 2023-09-23 (×14): 40 mg via ORAL
  Filled 2023-09-10 (×14): qty 2

## 2023-09-10 MED ORDER — INSULIN ASPART 100 UNIT/ML IJ SOLN
0.0000 [IU] | Freq: Three times a day (TID) | INTRAMUSCULAR | Status: DC
  Administered 2023-09-10 (×2): 4 [IU] via SUBCUTANEOUS

## 2023-09-10 MED ORDER — SODIUM CHLORIDE 0.9 % IV SOLN
1.0000 g | Freq: Every day | INTRAVENOUS | Status: DC
  Administered 2023-09-10 – 2023-09-11 (×3): 1 g via INTRAVENOUS
  Filled 2023-09-10 (×4): qty 10

## 2023-09-10 MED ORDER — OXYCODONE HCL 5 MG PO TABS
5.0000 mg | ORAL_TABLET | ORAL | Status: DC | PRN
  Administered 2023-09-12: 5 mg via ORAL
  Administered 2023-09-13 – 2023-09-18 (×6): 10 mg via ORAL
  Administered 2023-09-18 (×2): 5 mg via ORAL
  Administered 2023-09-19: 10 mg via ORAL
  Filled 2023-09-10 (×5): qty 2
  Filled 2023-09-10: qty 1
  Filled 2023-09-10 (×3): qty 2
  Filled 2023-09-10: qty 1

## 2023-09-10 MED ORDER — DEXTROMETHORPHAN POLISTIREX ER 30 MG/5ML PO SUER
30.0000 mg | Freq: Two times a day (BID) | ORAL | Status: DC
  Administered 2023-09-10 – 2023-09-23 (×24): 30 mg via ORAL
  Filled 2023-09-10 (×29): qty 5

## 2023-09-10 MED ORDER — HYDROCODONE BIT-HOMATROP MBR 5-1.5 MG/5ML PO SOLN
5.0000 mL | Freq: Four times a day (QID) | ORAL | Status: DC | PRN
  Administered 2023-09-10 – 2023-09-23 (×20): 5 mL via ORAL
  Filled 2023-09-10 (×22): qty 5

## 2023-09-10 MED ORDER — ASPIRIN 81 MG PO TBEC
81.0000 mg | DELAYED_RELEASE_TABLET | Freq: Every day | ORAL | Status: DC
  Administered 2023-09-10 – 2023-09-23 (×14): 81 mg via ORAL
  Filled 2023-09-10 (×14): qty 1

## 2023-09-10 MED ORDER — NYSTATIN 100000 UNIT/GM EX POWD
Freq: Two times a day (BID) | CUTANEOUS | Status: DC
  Filled 2023-09-10 (×2): qty 15

## 2023-09-10 MED ORDER — RISAQUAD PO CAPS: 1.0000 | ORAL_CAPSULE | Freq: Every day | ORAL | Status: DC

## 2023-09-10 MED ORDER — GUAIFENESIN-DM 100-10 MG/5ML PO SYRP
5.0000 mL | ORAL_SOLUTION | ORAL | Status: DC | PRN
  Administered 2023-09-10: 5 mL via ORAL
  Filled 2023-09-10: qty 5

## 2023-09-10 MED ORDER — IPRATROPIUM-ALBUTEROL 0.5-2.5 (3) MG/3ML IN SOLN
3.0000 mL | Freq: Four times a day (QID) | RESPIRATORY_TRACT | Status: DC
  Administered 2023-09-10 (×2): 3 mL via RESPIRATORY_TRACT
  Filled 2023-09-10 (×2): qty 3

## 2023-09-10 MED ORDER — ROPINIROLE HCL 0.5 MG PO TABS
0.2500 mg | ORAL_TABLET | Freq: Every day | ORAL | Status: DC
  Administered 2023-09-10 – 2023-09-22 (×13): 0.25 mg via ORAL
  Filled 2023-09-10 (×14): qty 1

## 2023-09-10 MED ORDER — INSULIN GLARGINE-YFGN 100 UNIT/ML ~~LOC~~ SOLN
10.0000 [IU] | Freq: Every day | SUBCUTANEOUS | Status: DC
  Administered 2023-09-10 – 2023-09-13 (×5): 10 [IU] via SUBCUTANEOUS
  Filled 2023-09-10 (×8): qty 0.1

## 2023-09-10 MED ORDER — TOPIRAMATE 25 MG PO TABS
50.0000 mg | ORAL_TABLET | Freq: Two times a day (BID) | ORAL | Status: DC
  Administered 2023-09-10 – 2023-09-23 (×26): 50 mg via ORAL
  Filled 2023-09-10 (×26): qty 2

## 2023-09-10 MED ORDER — GUAIFENESIN ER 600 MG PO TB12
600.0000 mg | ORAL_TABLET | Freq: Two times a day (BID) | ORAL | Status: DC
  Administered 2023-09-10 – 2023-09-23 (×25): 600 mg via ORAL
  Filled 2023-09-10 (×26): qty 1

## 2023-09-10 MED ORDER — ACETAMINOPHEN 650 MG RE SUPP: 650.0000 mg | Freq: Four times a day (QID) | RECTAL | Status: DC | PRN

## 2023-09-10 NOTE — Plan of Care (Signed)
  Problem: Coping: Goal: Ability to adjust to condition or change in health will improve Outcome: Progressing   Problem: Health Behavior/Discharge Planning: Goal: Ability to identify and utilize available resources and services will improve Outcome: Progressing   Problem: Skin Integrity: Goal: Risk for impaired skin integrity will decrease Outcome: Progressing   Problem: Education: Goal: Knowledge of General Education information will improve Description: Including pain rating scale, medication(s)/side effects and non-pharmacologic comfort measures Outcome: Progressing   Problem: Activity: Goal: Risk for activity intolerance will decrease Outcome: Progressing   Problem: Elimination: Goal: Will not experience complications related to bowel motility Outcome: Progressing

## 2023-09-10 NOTE — Progress Notes (Signed)
 [facility] Progress Note Patient: Anna [name] JYN:[id number] DOB: 1956-02-07 DOA: 09/09/2023  DOS: the patient was seen and examined on 09/10/2023  Brief Hospital Course: Patient with PMH of HTN, type II DM, COPD, CVA, depression, anxiety presented to the hospital with complaints of cough and shortness of breath. Diagnosed with COVID on 6/1.  Treated with molnupiravir.  Initially had some impression but progressively worsening shortness of breath and cough.  Was started on doxycycline .  Since she was not feeling better presented to the hospital. Current admitted for COPD exacerbation.  Assessment and Plan: COPD exacerbation. Appears to be mild. No hypoxia right now. At the time of my evaluation no significant wheezing heard. Does have some coarse breath sounds at the time of inspiration. Does have some some cough as well as increased respiratory distress with tachypnea. At present transition to oral steroids. Started on antibiotics for now we will continue. Procalcitonin ordered. RVP ordered. CRP ordered.  Recent COVID infection. Treated with molnupiravir. Out of the isolation.. RVP ordered to ensure no other viral infection. Monitor.  Excessive cough. Educated on minimizing injury to her throat. Will treat with aggressive cough regimen and monitor.  Concern for fungal infection. Patient reports that she feels like she is having a fungal infection in her urine as well as in her skin area in her pannus. Treat with 1 dose of Diflucan . Monitor.  CKD 3A. Metabolic acidosis Baseline serum creatinine appears to be 1.02. On admission serum creatinine was 1.1 920 21-1.24.  Monitor. Non-anion gap metabolic acidosis seen.  Mood disorder. On BuSpar , Prozac  Continue.  GERD. Continue PPI.  Restless leg syndrome. Continue Requip .  History of CVA with numbness. On aspirin  and Plavix . Was given Topamax  for numbness.  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 Home regimen includes dulaglutide, Toujeo , NovoLog , metformin . Currently on sliding scale insulin  as well as Lantus . Monitor. Also on gabapentin .  Will continue.  HLD. On Lipitor .  Continue.  Subjective: Continues to have cough and shortness of breath.  No nausea no vomiting.  Later on the RN reported that the patient is supporting vaginal burning and itching as well as redness involving her pannus area. Patient reported that she would like Diflucan  and nystatin  cream.  Also she reported to me that she would need something with codeine for cough.  Physical Exam: General: in moderate distress, No Rash Cardiovascular: S1 and S2 Present, No Murmur Respiratory: Good respiratory effort, Bilateral Air entry present.  Coarse breathing sound inspiration.  No Crackles, No wheezes Abdomen: Bowel Sound present, No tenderness Extremities: No edema Neuro: Alert and oriented x3, no new focal deficit  Data Reviewed: I have Reviewed nursing notes, Vitals, and Lab results. Since last encounter, pertinent lab results CBC and BMP   . I have ordered test including respiratory virus pathogen panel.  CBC and BMP  .   Disposition: Status is: Inpatient Remains inpatient appropriate because: Monitor for improvement in respiratory status.  enoxaparin  (LOVENOX ) injection 40 mg Start: 09/10/23 1000   Family Communication: Daughter at bedside Level of care: Med-Surg   Vitals:   09/10/23 1100 09/10/23 1200 09/10/23 1312 09/10/23 1722  BP: (!) 147/101 126/76 105/64 (!) 124/90  Pulse: 96 (!) 103 (!) 102   Resp: 15 (!) 24 18 18   Temp:      TempSrc:      SpO2: 100% 100% 100% 100%  Weight:      Height:  Author: Charlean Congress, MD 09/10/2023 6:25 PM  Please look on [email or web address] to find out who is on call.

## 2023-09-10 NOTE — Progress Notes (Signed)
 Right lower ext venous duplex  has been completed. Refer to [facility] under chart review to view preliminary results.   09/10/2023  9:33 AM Raghav Verrilli, Hollace Lund

## 2023-09-10 NOTE — H&P (Signed)
 History and Physical    Anna [name] ZOX:[id number] DOB: Jul 01, 1955 DOA: 09/09/2023  PCP: Sarrah Cure, DO   Patient coming from: Home   Chief Complaint: Cough, SOB   HPI: Anna [name] is a 67 y.o. female with medical history significant for hypertension, insulin -dependent diabetes mellitus, COPD, history of CVA, depression, anxiety, and reported COVID infection 2 and 1/2 weeks ago who now presents with cough and shortness of breath.  Patient developed cough and malaise towards the end of May, was diagnosed by home health with COVID on 08/25/2023, and she completed a course of molnupiravir.  She improved initially before her symptoms worsened again and she was treated with doxycycline .  She now reports worsening productive cough and shortness of breath.  Symptoms improved with bronchodilators.  She has had a couple episodes of vomiting after severe coughing fit but denies abdominal pain or diarrhea.  ED Course: Upon arrival to the ED, patient is found to be afebrile and saturating 100% on 2 L/min of supplemental oxygen with mild tachycardia and stable blood pressure.  Labs are most notable for creatinine 1.19, WBC 11,400, hemoglobin 9.8, and platelets 489.  Chest x-ray is negative for acute findings.  Patient was treated in the ED with supplemental oxygen, Decadron , DuoNeb, additional albuterol , Norco, and Tessalon  Perles.  Review of Systems:  All other systems reviewed and apart from HPI, are negative.  Past Medical History:  Diagnosis Date   Anemia    Anxiety    Arthritis    Asthma    Back pain    Bronchitis    Bursitis    Chronic kidney disease    COPD (chronic obstructive pulmonary disease) (HCC)    Depression    Diabetes mellitus    Diabetic neuropathy (HCC)    Diabetic retinopathy    GERD (gastroesophageal reflux disease)    Hyperlipemia    Hypertension    Migraine    Obesity    Pneumonia    Sepsis due to urinary tract infection (HCC)    Sepsis secondary to  UTI (HCC) 09/30/2022   Stroke (HCC)    2020    Past Surgical History:  Procedure Laterality Date   ABDOMINAL AORTOGRAM W/LOWER EXTREMITY Left 08/22/2021   Procedure: ABDOMINAL AORTOGRAM W/LOWER EXTREMITY;  Surgeon: Margherita Shell, MD;  Location: [facility];  Service: Cardiovascular;  Laterality: Left;   AMPUTATION Left 10/11/2021   Procedure: LEFT BELOW KNEE AMPUTATION;  Surgeon: Doylene Splinter Ford, MD;  Location: [facility] OR;  Service: Orthopedics;  Laterality: Left;   CESAREAN SECTION     x3   CHOLECYSTECTOMY     CYSTOSCOPY WITH RETROGRADE PYELOGRAM, URETEROSCOPY AND STENT PLACEMENT Right 02/06/2023   Procedure: CYSTOSCOPY WITH RIGHT  RETROGRADE PYELOGRAM AND RIGHT URETERAL STENT EXCHANGE;  Surgeon: Adelbert Homans, MD;  Location: [facility];  Service: Urology;  Laterality: Right;  30 MINUTES   CYSTOSCOPY WITH STENT PLACEMENT Right 09/30/2022   Procedure: CYSTOSCOPY WITH STENT PLACEMENT;  Surgeon: Samson Croak, MD;  Location: [facility] OR;  Service: Urology;  Laterality: Right;   EYE SURGERY Left    retina surgery had laser   METATARSAL HEAD EXCISION Left 08/24/2021   Procedure: METATARSAL HEAD EXCISION FIFTH TOE;  Surgeon: Charity Conch, DPM;  Location: [facility] OR;  Service: Podiatry;  Laterality: Left;   PERIPHERAL VASCULAR BALLOON ANGIOPLASTY  08/22/2021   Procedure: PERIPHERAL VASCULAR BALLOON ANGIOPLASTY;  Surgeon: Margherita Shell, MD;  Location: [facility];  Service: Cardiovascular;;  Left AT   WOUND DEBRIDEMENT Left 08/24/2021   Procedure: DEBRIDEMENT WOUND OF LEFT FOOT;  Surgeon: Charity Conch, DPM;  Location: [facility] OR;  Service: Podiatry;  Laterality: Left;    Social History:   reports that she has never smoked. She has never used smokeless tobacco. She reports that she does not drink alcohol and does not use drugs.  Allergies  Allergen Reactions   Lasix [Furosemide] Shortness Of Breath and Rash   Bacitracin     Burns skin     Citalopram Diarrhea    Doxycycline  Other (See Comments)    burning all over body     Morphine And Codeine Hives   Neomycin     Used for pink eye, may pink eye worse     Ciprofloxacin      Tongue swells   Ciprofloxacin -Dexamethasone  Other (See Comments)    Tongue swelling    Duloxetine     Mental Status Changes (intolerance) saw pictures of gun   Latex Itching   Levaquin [Levofloxacin In D5w] Other (See Comments)    Pt does not remember reaction but states she's had issues with med   Other     White pepper- uncontrollable sneezing    Salvia Officinalis     Sage- sneezing    Soma [Carisoprodol]     Sleepy and constipation      History reviewed. No pertinent family history.   Prior to Admission medications   Medication Sig Start Date End Date Taking? Authorizing Provider  acetaminophen  (TYLENOL ) 500 MG tablet Take 1,000-1,500 mg by mouth every 8 (eight) hours as needed for moderate pain (pain score 4-6).    [provider]  albuterol  (VENTOLIN  HFA) 108 (90 Base) MCG/ACT inhaler Inhale 1-2 puffs into the lungs every 6 (six) hours as needed for wheezing or shortness of breath. 10/04/22   Singh, Prashant K, MD  Apoaequorin (PREVAGEN) 10 MG CAPS Take 10 mg by mouth daily.    [provider]  aspirin  EC 81 MG tablet Take 81 mg by mouth daily. Swallow whole.    [provider]  Aspirin -Acetaminophen -Caffeine (GOODY HEADACHE PO) Take 2 packets by mouth daily as needed (headaches).    [provider]  atorvastatin  (LIPITOR ) 80 MG tablet Take 1 tablet (80 mg total) by mouth at bedtime. 06/04/23   Regalado, Belkys A, MD  benzonatate  (TESSALON ) 100 MG capsule Take 100 mg by mouth 3 (three) times daily as needed for cough.    [provider]  busPIRone  (BUSPAR ) 7.5 MG tablet Take 7.5 mg by mouth 2 (two) times daily.    [provider]  diclofenac  Sodium (VOLTAREN ) 1 % GEL Apply 4 g topically as needed (joint pain, phantom joint pain).    [provider]  Dulaglutide 3 MG/0.5ML SOPN Inject 3 mg into the skin every Saturday.    [provider]  estradiol (ESTRACE) 0.1 MG/GM vaginal cream Place 1 Applicatorful vaginally as needed (dryness / irritation).    [provider]  FLUoxetine  (PROZAC ) 40 MG capsule Take 40 mg by mouth daily.    [provider]  fluticasone (FLONASE) 50 MCG/ACT nasal spray Place 1 spray into both nostrils daily as needed for allergies or rhinitis.    [provider]  gabapentin  (NEURONTIN ) 400 MG capsule Take 400 mg by mouth 3 (three) times daily.    [provider]  hydrOXYzine  (ATARAX ) 10 MG tablet Take 1 tablet (10 mg total) by mouth 2 (two) times daily. Patient taking differently:  Take 10 mg by mouth daily. 10/13/21   Samtani, Jai-Gurmukh, MD  insulin  aspart (NOVOLOG ) 100 UNIT/ML FlexPen Use as directed before each meal 3 times a day: 140-199 - 2 units, 200-250 - 4 units, 251-299 - 6 units,  300-349 - 8 units,  350 or above 10 units. Patient taking differently: Inject 2-6 Units into the skin 3 (three) times daily as needed for high blood sugar. 10/04/22   Singh, Prashant K, MD  insulin  glargine, 1 Unit Dial , (TOUJEO  SOLOSTAR) 300 UNIT/ML Solostar Pen Inject 10 Units into the skin at bedtime. 06/04/23   Regalado, Belkys A, MD  ipratropium-albuterol  (DUONEB) 0.5-2.5 (3) MG/3ML SOLN Use 3 ml by nebulization twice a day scheduled and every 4 hours as needed for shortness of breath and wheezing Patient taking differently: Take 3 mLs by nebulization daily as needed (SOB and wheezing). Use 3 ml by nebulization twice a day scheduled and every 4 hours as needed for shortness of breath and wheezing 10/04/22   Singh, Prashant K, MD  Lactobacillus Rhamnosus, GG, (CULTURELLE HEALTH & WELLNESS) CAPS Take 1 capsule by mouth daily. 08/05/21   [provider]  lidocaine  4 % Place 1 patch onto the skin daily as needed (pain).    [provider]  lisinopril  (ZESTRIL ) 5 MG tablet Take  5 mg by mouth at bedtime.    [provider]  loratadine  (CLARITIN ) 10 MG tablet Take 10 mg by mouth daily as needed for allergies.    [provider]  meclizine (ANTIVERT) 25 MG tablet Take 25 mg by mouth daily as needed for dizziness. 10/01/19   [provider]  Menthol, Topical Analgesic, (BIOFREEZE EX) Apply 1 Application topically daily as needed (neck and shoulder pain).    [provider]  metFORMIN  (GLUCOPHAGE ) 1000 MG tablet Take 1,000 mg by mouth 2 (two) times daily with a meal.    [provider]  nystatin  (MYCOSTATIN ) 100000 UNIT/ML suspension Take 1 mL by mouth 4 (four) times daily as needed (red gums).    [provider]  nystatin  cream (MYCOSTATIN ) Apply 1 application. topically 4 (four) times daily as needed for dry skin.    [provider]  ondansetron  (ZOFRAN -ODT) 4 MG disintegrating tablet Take 4 mg by mouth every 8 (eight) hours as needed for nausea or vomiting.    [provider]  ondansetron  (ZOFRAN -ODT) 4 MG disintegrating tablet Take 1 tablet (4 mg total) by mouth every 8 (eight) hours as needed for nausea or vomiting. 08/08/23   Carin Charleston, MD  pantoprazole  (PROTONIX ) 40 MG tablet Take 40 mg by mouth 2 (two) times daily.    [provider]  rOPINIRole  (REQUIP ) 0.25 MG tablet Take 0.25 mg by mouth at bedtime.    [provider]  sennosides-docusate sodium  (SENOKOT-S) 8.6-50 MG tablet Take 1 tablet by mouth 2 (two) times daily as needed for constipation.    [provider]  tamsulosin  (FLOMAX ) 0.4 MG CAPS capsule Take 1 capsule (0.4 mg total) by mouth daily. 10/04/22   Singh, Prashant K, MD  topiramate  (TOPAMAX ) 50 MG tablet Take 1 tablet (50 mg total) by mouth 2 (two) times daily. 06/05/23   Loma Rising, MD    Physical Exam: Vitals:   09/09/23 1656 09/09/23 1700 09/09/23 2216 09/09/23 2300  BP:  109/62 (!) 145/54 (!) 133/112  Pulse:  95 (!) 105 (!) 109  Resp:  12 20    Temp:  99.3 F (37.4 C) (!) 97.4 F (36.3 C)  TempSrc:  Oral Temporal   SpO2:  100% 100% 100%  Weight: 72.6 kg     Height: 5' 2 (1.575 m)       Constitutional: NAD, no pallor or diaphoresis   Eyes: PERTLA, lids and conjunctivae normal ENMT: Mucous membranes are moist. Posterior pharynx clear of any exudate or lesions.   Neck: supple, no masses  Respiratory: Diminished bilaterally with prolonged expiratory phase and wheezing. Speaking full sentences.  Cardiovascular: S1 & S2 heard, regular rate and rhythm. Trace lower extremity edema.   Abdomen: Soft, no tenderness. Bowel sounds active.  Musculoskeletal: no clubbing / cyanosis. S/p left BKA.   Skin: no significant rashes, lesions, ulcers. Warm, dry, well-perfused. Neurologic: CN 2-12 grossly intact. Moving all extremities. Alert and oriented.  Psychiatric: Calm. Cooperative.    Labs and Imaging on Admission: I have personally reviewed following labs and imaging studies  CBC: Recent Labs  Lab 09/09/23 1931  WBC 11.4*  NEUTROABS 6.3  HGB 9.8*  HCT 33.3*  MCV 95.4  PLT 489*   Basic Metabolic Panel: Recent Labs  Lab 09/09/23 1931  NA 139  K 4.2  CL 106  CO2 22  GLUCOSE 179*  BUN 13  CREATININE 1.19*  CALCIUM  8.6*   GFR: Estimated Creatinine Clearance: 42.2 mL/min (A) (by C-G formula based on SCr of 1.19 mg/dL (H)). Liver Function Tests: No results for input(s): AST, ALT, ALKPHOS, BILITOT, PROT, ALBUMIN in the last 168 hours. No results for input(s): LIPASE, AMYLASE in the last 168 hours. No results for input(s): AMMONIA in the last 168 hours. Coagulation Profile: No results for input(s): INR, PROTIME in the last 168 hours. Cardiac Enzymes: No results for input(s): CKTOTAL, CKMB, CKMBINDEX, TROPONINI in the last 168 hours. BNP (last 3 results) No results for input(s): PROBNP in the last 8760 hours. HbA1C: No results for input(s): HGBA1C in the last 72 hours. CBG: No  results for input(s): GLUCAP in the last 168 hours. Lipid Profile: No results for input(s): CHOL, HDL, LDLCALC, TRIG, CHOLHDL, LDLDIRECT in the last 72 hours. Thyroid Function Tests: No results for input(s): TSH, T4TOTAL, FREET4, T3FREE, THYROIDAB in the last 72 hours. Anemia Panel: No results for input(s): VITAMINB12, FOLATE, FERRITIN, TIBC, IRON, RETICCTPCT in the last 72 hours. Urine analysis:    Component Value Date/Time   COLORURINE YELLOW 09/09/2023 2336   APPEARANCEUR HAZY (A) 09/09/2023 2336   LABSPEC 1.008 09/09/2023 2336   PHURINE 5.0 09/09/2023 2336   GLUCOSEU NEGATIVE 09/09/2023 2336   HGBUR MODERATE (A) 09/09/2023 2336   BILIRUBINUR NEGATIVE 09/09/2023 2336   KETONESUR NEGATIVE 09/09/2023 2336   PROTEINUR NEGATIVE 09/09/2023 2336   UROBILINOGEN 1.0 06/23/2014 1950   NITRITE NEGATIVE 09/09/2023 2336   LEUKOCYTESUR LARGE (A) 09/09/2023 2336   Sepsis Labs: @LABRCNTIP (procalcitonin:4,lacticidven:4) )No results found for this or any previous visit (from the past 240 hours).   Radiological Exams on Admission: DG Chest 2 View Result Date: 09/09/2023 EXAM: 2 VIEW(S) XRAY OF THE CHEST 09/09/2023 07:55:00 PM COMPARISON: 08/08/2023 CLINICAL HISTORY: Cough. SOB, cough for over 2 weeks. History of COPD. FINDINGS: LUNGS AND PLEURA: No focal pulmonary opacity. No pulmonary edema. No pleural effusion. No pneumothorax. HEART AND MEDIASTINUM: No acute abnormality of the cardiac and mediastinal silhouettes. BONES AND SOFT TISSUES: Mild degenerative changes of the mid/lower thoracic spine. Cholecystectomy clips. No acute osseous abnormality. IMPRESSION: 1. No acute process. Electronically signed by: Zadie Herter MD 09/09/2023 08:02 PM EDT RP Workstation: [id number]     Assessment/Plan   1. COPD exacerbation  -  Culture sputum, continue systemic steroids, continue short-acting bronchodilators, start antibiotic, continue supportive care   2. Type II  DM  - A1c was 7.5% in March 2025  - Check CBGs, continue long- and short-acting insulin     3. CKD 3A  - Appears close to baseline  - Renally-dose medications    4. Hx of CVA  - ASA, Lipitor     5. Anxiety  - Prozac , Buspar , hydroxyzine     6. OSA  - CPAP while sleeping     DVT prophylaxis: Lovenox   Code Status: Full  Level of Care: Level of care: Med-Surg Family Communication: None present   Disposition Plan:  Patient is from: home  Anticipated d/c is to: Home  Anticipated d/c date is: 6/17 or 09/11/23  Patient currently: Pending improved respiratory status  Consults called: none  Admission status: Observation     Walton Guppy, MD [facility]  09/10/2023, 1:15 AM

## 2023-09-10 NOTE — ED Notes (Signed)
 Oxygen decreased and patient called out and stated she felt short of breath and needed more oxygen.

## 2023-09-10 NOTE — Inpatient Diabetes Management (Signed)
 Inpatient Diabetes Program Recommendations  AACE/ADA: New Consensus Statement on Inpatient Glycemic Control (2015)  Target Ranges:  Prepandial:   less than 140 mg/dL      Peak postprandial:   less than 180 mg/dL (1-2 hours)      Critically ill patients:  140 - 180 mg/dL   Lab Results  Component Value Date   GLUCAP 345 (H) 09/10/2023   HGBA1C 7.5 (H) 06/02/2023    Review of Glycemic Control  Latest Reference Range & Units 09/10/23 01:41 09/10/23 07:35  Glucose-Capillary 70 - 99 mg/dL 811 (H) 914 (H)   Diabetes history: DM 2 Outpatient Diabetes medications: Trulicity 3 mg Weekly, Toujeo  25 units qhs, metformin  1000 m bid Current orders for Inpatient glycemic control:  Semglee  10 units Novolog  0-6 units tid + hs  Solumedrol 125 mg x 2 doses now on PO prednisone  40 mg Daily  Inpatient Diabetes Program Recommendations:    Note glucose 345 after Semglee  10 units given last night  -   Increase Semglee  closer to home dose 20 units  Thanks,  Eloise Hake RN, MSN, BC-ADM Inpatient Diabetes Coordinator Team Pager [phone] (8a-5p)

## 2023-09-10 NOTE — ED Notes (Signed)
 Please update Asa Lauth (son) NEW number [phone]

## 2023-09-11 ENCOUNTER — Inpatient Hospital Stay (HOSPITAL_COMMUNITY): Payer: Medicare (Managed Care)

## 2023-09-11 DIAGNOSIS — N1831 Chronic kidney disease, stage 3a: Secondary | ICD-10-CM | POA: Diagnosis not present

## 2023-09-11 DIAGNOSIS — Z794 Long term (current) use of insulin: Secondary | ICD-10-CM | POA: Diagnosis not present

## 2023-09-11 DIAGNOSIS — J441 Chronic obstructive pulmonary disease with (acute) exacerbation: Secondary | ICD-10-CM | POA: Diagnosis not present

## 2023-09-11 DIAGNOSIS — R0609 Other forms of dyspnea: Secondary | ICD-10-CM | POA: Diagnosis not present

## 2023-09-11 DIAGNOSIS — E1152 Type 2 diabetes mellitus with diabetic peripheral angiopathy with gangrene: Secondary | ICD-10-CM | POA: Diagnosis not present

## 2023-09-11 LAB — ECHOCARDIOGRAM COMPLETE
Area-P 1/2: 6.07 cm2
Calc EF: 55.7 %
Height: 62 in
Single Plane A2C EF: 45.2 %
Single Plane A4C EF: 67.7 %
Weight: 2560 [oz_av]

## 2023-09-11 LAB — GLUCOSE, CAPILLARY
Glucose-Capillary: 157 mg/dL — ABNORMAL HIGH (ref 70–99)
Glucose-Capillary: 115 mg/dL — ABNORMAL HIGH (ref 70–99)
Glucose-Capillary: 315 mg/dL — ABNORMAL HIGH (ref 70–99)
Glucose-Capillary: 315 mg/dL — ABNORMAL HIGH (ref 70–99)

## 2023-09-11 LAB — CBC
HCT: 29.9 % — ABNORMAL LOW (ref 36.0–46.0)
Hemoglobin: 9 g/dL — ABNORMAL LOW (ref 12.0–15.0)
MCH: 28 pg (ref 26.0–34.0)
MCHC: 30.1 g/dL (ref 30.0–36.0)
MCV: 92.9 fL (ref 80.0–100.0)
Platelets: 395 10*3/uL (ref 150–400)
RBC: 3.22 MIL/uL — ABNORMAL LOW (ref 3.87–5.11)
RDW: 15.4 % (ref 11.5–15.5)
WBC: 13.1 10*3/uL — ABNORMAL HIGH (ref 4.0–10.5)
nRBC: 0 % (ref 0.0–0.2)

## 2023-09-11 LAB — BASIC METABOLIC PANEL WITH GFR
Anion gap: 8 (ref 5–15)
BUN: 15 mg/dL (ref 8–23)
CO2: 21 mmol/L — ABNORMAL LOW (ref 22–32)
Calcium: 8.5 mg/dL — ABNORMAL LOW (ref 8.9–10.3)
Chloride: 106 mmol/L (ref 98–111)
Creatinine, Ser: 1.05 mg/dL — ABNORMAL HIGH (ref 0.44–1.00)
GFR, Estimated: 58 mL/min — ABNORMAL LOW (ref >60)
Glucose, Bld: 161 mg/dL — ABNORMAL HIGH (ref 70–99)
Potassium: 4.8 mmol/L (ref 3.5–5.1)
Sodium: 135 mmol/L (ref 135–145)

## 2023-09-11 MED ORDER — PERFLUTREN LIPID MICROSPHERE
1.0000 mL | INTRAVENOUS | Status: AC | PRN
  Administered 2023-09-11: 5 mL via INTRAVENOUS

## 2023-09-11 MED ORDER — IOHEXOL 350 MG/ML SOLN
60.0000 mL | Freq: Once | INTRAVENOUS | Status: AC | PRN
  Administered 2023-09-11: 60 mL via INTRAVENOUS

## 2023-09-11 MED ORDER — LORATADINE 10 MG PO TABS
10.0000 mg | ORAL_TABLET | Freq: Every day | ORAL | Status: DC
  Administered 2023-09-11 – 2023-09-23 (×12): 10 mg via ORAL
  Filled 2023-09-11 (×13): qty 1

## 2023-09-11 MED ORDER — SODIUM CHLORIDE 0.9% FLUSH: 10.0000 mL | INTRAVENOUS | Status: DC | PRN

## 2023-09-11 MED ORDER — METHYLPREDNISOLONE SODIUM SUCC 40 MG IJ SOLR
40.0000 mg | Freq: Two times a day (BID) | INTRAMUSCULAR | Status: DC
  Administered 2023-09-11 – 2023-09-14 (×7): 40 mg via INTRAVENOUS
  Filled 2023-09-11 (×7): qty 1

## 2023-09-11 MED ORDER — MUSCLE RUB 10-15 % EX CREA
TOPICAL_CREAM | CUTANEOUS | Status: DC | PRN
  Administered 2023-09-16: 1 via TOPICAL
  Filled 2023-09-11 (×2): qty 85

## 2023-09-11 NOTE — Progress Notes (Signed)
 [facility] PROGRESS NOTE   Anna [name] ZOX:[id number] DOB: October 15, 1955 DOA: 09/09/2023  PCP: Sarrah Cure, DO  Brief History: 68 y.o. female with medical history significant for hypertension, insulin -dependent diabetes mellitus, COPD, history of CVA, depression, anxiety, and reported COVID infection 2 and 1/2 weeks prior to admission who presented with cough and shortness of breath.  For her COVID-19 infection she was given a course of molnupiravir.  Her symptoms improved but worsened again for which she was given doxycycline .  Chest x-ray was negative for acute findings.  Patient was hospitalized for further management.  Consultants: None  Procedures: None    Subjective/Interval History: Patient continues to have difficulty breathing.  Having a lot of coughing spells.  Mostly with yellowish-greenish expectoration.  Some chest discomfort is present.    Assessment/Plan:  COPD with acute exacerbation Noted to have wheezing bilaterally. She does have a known history of COPD but does not smoke cigarettes anymore. Continue with Solu-Medrol  for now.  Patient noted to be on ceftriaxone .  Recently completed a course of doxycycline . Chest x-ray did not show any acute findings. It appears that her symptoms have been ongoing since she was diagnosed with COVID-19 2 to 3 weeks ago. Not unreasonable to pursue CT imaging considering the chest x-ray did not show any acute findings and patient has significant symptoms.  Will order CT angiogram chest. Doppler studies are negative for DVT. Procalcitonin level was less than 0.1.  CRP 0.5.  Recent COVID-19 infection Treated with molnupiravir.  Concern for yeast infection She was given a dose of Diflucan .  Chronic kidney disease stage III A Renal function close to baseline.  Monitor urine output.  Diabetes mellitus type 2, uncontrolled with hyperglycemia Home medication regimen include dulaglutide, Toujeo , NovoLog ,  metformin . Currently on SSI and glargine.  Anticipate floor glycemic control due to steroid use.  Normocytic anemia Drop in hemoglobin is possibly dilutional.  No evidence of overt bleeding.  Check anemia panel.  DVT Prophylaxis: Lovenox  Code Status: Full code Family Communication: Discussed with patient Disposition Plan: Hopefully return home when improved  Status is: Inpatient Remains inpatient appropriate because: Dyspnea, COPD exacerbation      Medications: Scheduled:  aspirin  EC  81 mg Oral Daily   atorvastatin   80 mg Oral QHS   benzonatate   100 mg Oral TID   busPIRone   7.5 mg Oral BID   dextromethorphan   30 mg Oral BID   enoxaparin  (LOVENOX ) injection  40 mg Subcutaneous Daily   ferrous sulfate  325 mg Oral Q breakfast   FLUoxetine   40 mg Oral Daily   gabapentin   400 mg Oral TID   guaiFENesin   600 mg Oral BID   insulin  aspart  0-20 Units Subcutaneous TID WC   insulin  aspart  0-5 Units Subcutaneous QHS   insulin  glargine-yfgn  10 Units Subcutaneous QHS   loratadine   10 mg Oral Daily   methylPREDNISolone  (SOLU-MEDROL ) injection  40 mg Intravenous Q12H   nystatin    Topical BID   pantoprazole   40 mg Oral BID   rOPINIRole   0.25 mg Oral QHS   tamsulosin   0.4 mg Oral Daily   topiramate   50 mg Oral BID   Continuous:  cefTRIAXone  (ROCEPHIN )  IV Stopped (09/10/23 2208)   PRN:acetaminophen  **OR** acetaminophen , HYDROcodone  bit-homatropine, hydrOXYzine , ipratropium-albuterol , Muscle Rub, oxyCODONE , polyethylene glycol, prochlorperazine   Antibiotics: Anti-infectives (From admission, onward)    Start     Dose/Rate Route Frequency Ordered Stop   09/10/23 1700  fluconazole  (DIFLUCAN ) tablet 150 mg  150 mg Oral  Once 09/10/23 1603 09/10/23 1736   09/10/23 0130  cefTRIAXone  (ROCEPHIN ) 1 g in sodium chloride  0.9 % 100 mL IVPB        1 g 200 mL/hr over 30 Minutes Intravenous Daily at bedtime 09/10/23 0115 09/14/23 2159       Objective:  Vital Signs  Vitals:    09/10/23 1942 09/10/23 2345 09/11/23 0504 09/11/23 0745  BP: 119/75 93/60 116/88 132/79  Pulse: (!) 107 95 91 (!) 110  Resp: 20 18 20 15   Temp: 99.5 F (37.5 C) 98.5 F (36.9 C) 97.8 F (36.6 C) 98.1 F (36.7 C)  TempSrc: Oral Oral Oral Oral  SpO2: 100% 99% 100% 100%  Weight:      Height:        Intake/Output Summary (Last 24 hours) at 09/11/2023 1017 Last data filed at 09/11/2023 0612 Gross per 24 hour  Intake 580 ml  Output 1000 ml  Net -420 ml   Filed Weights   09/09/23 1656  Weight: 72.6 kg    General appearance: Awake alert.  In no distress Resp: Tachypneic.  No use of accessory muscles.  Scattered wheezing bilaterally.  Coarse breath sounds.  Crackles at the bases. Cardio: S1-S2 is normal regular.  No S3-S4.  No rubs murmurs or bruit GI: Abdomen is soft.  Nontender nondistended.  Bowel sounds are present normal.  No masses organomegaly Extremities: No edema.  Full range of motion of lower extremities. Neurologic: Alert and oriented x3.  No focal neurological deficits.    Lab Results:  Data Reviewed: I have personally reviewed following labs and reports of the imaging studies  CBC: Recent Labs  Lab 09/09/23 1931 09/10/23 0528 09/11/23 0620  WBC 11.4* 11.4* 13.1*  NEUTROABS 6.3  --   --   HGB 9.8* 9.6* 9.0*  HCT 33.3* 32.2* 29.9*  MCV 95.4 94.2 92.9  PLT 489* 430* 395    Basic Metabolic Panel: Recent Labs  Lab 09/09/23 1931 09/10/23 0528 09/11/23 0620  NA 139 135 135  K 4.2 3.8 4.8  CL 106 106 106  CO2 22 17* 21*  GLUCOSE 179* 404* 161*  BUN 13 14 15   CREATININE 1.19* 1.24* 1.05*  CALCIUM  8.6* 8.2* 8.5*    GFR: Estimated Creatinine Clearance: 47.8 mL/min (A) (by C-G formula based on SCr of 1.05 mg/dL (H)).  CBG: Recent Labs  Lab 09/10/23 0735 09/10/23 1307 09/10/23 1723 09/10/23 2059 09/11/23 0742  GLUCAP 345* 335* 274* 251* 157*     Recent Results (from the past 240 hours)  Urine Culture     Status: Abnormal   Collection Time:  09/09/23 11:36 PM   Specimen: Urine, Random  Result Value Ref Range Status   Specimen Description URINE, RANDOM  Final   Special Requests   Final    NONE Reflexed from [id number] Performed at [facility], [address], [place], [place] [address]    Culture MULTIPLE SPECIES PRESENT, SUGGEST RECOLLECTION (A)  Final   Report Status 09/10/2023 FINAL  Final      Radiology Studies: VAS US  LOWER EXTREMITY VENOUS (DVT) (7a-7p) Result Date: 09/10/2023  Lower Venous DVT Study Patient Name:  Anna [name]  Date of Exam:   09/10/2023 Medical Rec #: [id number]         Accession #:    [id number] Date of Birth: 10-09-1955        Patient Gender: F Patient Age:   81 years Exam Location:  [facility]  Procedure:      VAS US  LOWER EXTREMITY VENOUS (DVT) Referring Phys: TIMOTHY OPYD --------------------------------------------------------------------------------  Indications: Pain, Swelling, and Right foot. Other Indications: Diabetes, left BKA. Comparison Study: No priors. Performing Technologist: Franky Ivanoff Sturdivant-Jones RDMS, RVT  Examination Guidelines: A complete evaluation includes B-mode imaging, spectral Doppler, color Doppler, and power Doppler as needed of all accessible portions of each vessel. Bilateral testing is considered an integral part of a complete examination. Limited examinations for reoccurring indications may be performed as noted. The reflux portion of the exam is performed with the patient in reverse Trendelenburg.  +---------+---------------+---------+-----------+----------+--------------+ RIGHT    CompressibilityPhasicitySpontaneityPropertiesThrombus Aging +---------+---------------+---------+-----------+----------+--------------+ CFV      Full           Yes      Yes                                 +---------+---------------+---------+-----------+----------+--------------+ SFJ      Full                                                         +---------+---------------+---------+-----------+----------+--------------+ FV Prox  Full                                                        +---------+---------------+---------+-----------+----------+--------------+ FV Mid   Full                                                        +---------+---------------+---------+-----------+----------+--------------+ FV DistalFull                                                        +---------+---------------+---------+-----------+----------+--------------+ PFV      Full                                                        +---------+---------------+---------+-----------+----------+--------------+ POP      Full           Yes      Yes                                 +---------+---------------+---------+-----------+----------+--------------+ PTV      Full                                                        +---------+---------------+---------+-----------+----------+--------------+ PERO     Full                                                        +---------+---------------+---------+-----------+----------+--------------+   +----+---------------+---------+-----------+----------+--------------+  LEFTCompressibilityPhasicitySpontaneityPropertiesThrombus Aging +----+---------------+---------+-----------+----------+--------------+ CFV Full           Yes      Yes                                 +----+---------------+---------+-----------+----------+--------------+ SFJ Full                                                        +----+---------------+---------+-----------+----------+--------------+    Summary: RIGHT: - There is no evidence of deep vein thrombosis in the lower extremity.  - No cystic structure found in the popliteal fossa.  LEFT: - No evidence of common femoral vein obstruction.   *See table(s) above for measurements and observations. Electronically signed by Runell Countryman on 09/10/2023  at 4:30:57 PM.    Final    DG Chest 2 View Result Date: 09/09/2023 EXAM: 2 VIEW(S) XRAY OF THE CHEST 09/09/2023 07:55:00 PM COMPARISON: 08/08/2023 CLINICAL HISTORY: Cough. SOB, cough for over 2 weeks. History of COPD. FINDINGS: LUNGS AND PLEURA: No focal pulmonary opacity. No pulmonary edema. No pleural effusion. No pneumothorax. HEART AND MEDIASTINUM: No acute abnormality of the cardiac and mediastinal silhouettes. BONES AND SOFT TISSUES: Mild degenerative changes of the mid/lower thoracic spine. Cholecystectomy clips. No acute osseous abnormality. IMPRESSION: 1. No acute process. Electronically signed by: Zadie Herter MD 09/09/2023 08:02 PM EDT RP Workstation: [id number]       LOS: 1 day   Maylene Spear  [facility] Pager on [email or web address]  09/11/2023, 10:17 AM

## 2023-09-11 NOTE — Progress Notes (Signed)
 Resources placed on AVS.

## 2023-09-11 NOTE — TOC Initial Note (Addendum)
 Transition of Care (TOC) - Initial/Assessment Note   Spoke to patient at bedside.   Patient from home alone.   Has family , they live in [place].   She has bedside commode, walker, manuel wheelchair , and shower chair at home.   She has an aide 5 days a week for 2 hours a day.   Patient states she has home health nurse but unsure of name of agency. NCM called Wallene Gum at [facility] and left a voicemail. Await call back.   Patient requesting electric wheelchair for home. Spoke to [facility] and was told , post discharge patient will have a hospital follow up appointment with [facility] provider , at that time she can discuss an electric wheelchair.   At discharge patient will need transportation home. If discharged Monday through Friday between 8 am and 5 pm , [facility] can transport home. Patient states last time it was after business hours and need ambulance transport home. Will try to coordinate discharge with [facility].    Patient asked for help with rent and utilities . NCM placed resources on AVS. NCM encouraged patient to also discuss with family.   Patient does not have home oxygen. Patient is requesting home oxygen. Explained NCM will need ambulatory  oxygen saturation note and MD order. Patient voiced understanding.   NCM will continue to follow.   Wallene Gum from [facility] returned call. PAtient has an aide through [facility] [employer], aides name is Cocco . Patient does not have a HHRN. She does have a nurse at [employer]. If home oxygen needed , orders and documentation will need to be faxed to [facility] at [phone]   [facility] from [facility] called. They have also provided resources for rent etc.   [facility] is closed tomorrow due to the holiday . If needed call main number for on call staff   Patient Details  Name: Anna [name] MRN: [id number] Date of Birth: 1955-12-31  Transition of Care [facility]) CM/SW Contact:    Terre Ferri, RN Phone Number: 09/11/2023, 11:44 AM  Clinical Narrative:                    Expected Discharge Plan: Home w Home Health Services Barriers to Discharge: Continued Medical Work up   Patient Goals and CMS Choice Patient states their goals for this hospitalization and ongoing recovery are:: to return to home          Expected Discharge Plan and Services   Discharge Planning Services: CM Consult Post Acute Care Choice: NA Living arrangements for the past 2 months: Apartment                 DME Arranged: N/A DME Agency: NA       HH Arranged:  (see note)          Prior Living Arrangements/Services Living arrangements for the past 2 months: Apartment Lives with:: Self Patient language and need for interpreter reviewed:: Yes Do you feel safe going back to the place where you live?: Yes      Need for Family Participation in Patient Care: Yes (Comment) Care giver support system in place?: Yes (comment) (aide) Current home services: DME Criminal Activity/Legal Involvement Pertinent to Current Situation/Hospitalization: No - Comment as needed  Activities of Daily Living   ADL Screening (condition at time of admission) Independently performs ADLs?: Yes (appropriate for developmental age) Is the patient deaf or have difficulty hearing?: No Does the patient have difficulty seeing, even when wearing  glasses/contacts?: No Does the patient have difficulty concentrating, remembering, or making decisions?: No  Permission Sought/Granted   Permission granted to share information with : Yes, Verbal Permission Granted     Permission granted to share info w AGENCY: [facility]        Emotional Assessment Appearance:: Appears stated age Attitude/Demeanor/Rapport: Engaged Affect (typically observed): Appropriate Orientation: : Oriented to Self, Oriented to Place, Oriented to  Time, Oriented to Situation Alcohol / Substance Use: Not Applicable Psych Involvement: No (comment)  Admission diagnosis:  COPD exacerbation (HCC) [J44.1] Acute respiratory failure  with hypoxia (HCC) [J96.01] COPD with acute exacerbation (HCC) [J44.1] Acute cough [R05.1] Patient Active Problem List   Diagnosis Date Noted   COPD with acute exacerbation (HCC) 09/10/2023   CKD stage 3a, GFR 45-59 ml/min (HCC) 09/10/2023   COPD exacerbation (HCC) 09/10/2023   Fall at home, initial encounter 06/02/2023   Gangrene of toe of left foot (HCC)    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) 10/06/2021   Dyslipidemia 10/06/2021   Mood disorder (HCC) 10/06/2021   Cellulitis 08/19/2021   Hyperkalemia 08/19/2021   OSA (obstructive sleep apnea) 08/19/2021   Anxiety 08/04/2021   Hyperlipidemia 08/04/2021   Thrombocytosis 08/04/2021   Status post amputation of lesser toe of left foot (HCC) 06/21/2021   PAD (peripheral artery disease) (HCC) 06/12/2021   History of stroke 04/11/2021   Type 2 diabetes mellitus with diabetic peripheral angiopathy and gangrene, with long-term current use of insulin  (HCC) 04/03/2021   Left sided numbness 03/12/2021   Spinal stenosis of lumbar region with neurogenic claudication 03/18/2019   GERD (gastroesophageal reflux disease) 12/12/2017   History of adenomatous polyp of colon 01/14/2017   Dysphagia 09/12/2016   Screening for colon cancer 09/12/2016   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 [place] [address] Phone: [phone] Fax: [phone]  [facility] - [place], [place] - [address] [place] [address] Phone: [phone] Fax: [phone]  [facility] [address] [place] [address] Phone: [phone] Fax: [phone]  [id number] [place], [place] - [address] AT [facility] [address] [place] [address] Phone: [phone] Fax: [phone]  [facility] - [place], [place] - [address] [place] [address] Phone: [phone] Fax: [phone]     Social Drivers of Health (SDOH) Social History: SDOH Screenings   Food Insecurity: No Food Insecurity (09/10/2023)  Housing: High Risk (09/10/2023)  Transportation Needs: No Transportation Needs (09/10/2023)  Utilities: At Risk (09/10/2023)  Social Connections: Socially Isolated (09/10/2023)  Tobacco Use: Low Risk  (09/10/2023)   SDOH Interventions:     Readmission Risk Interventions     No data to display

## 2023-09-11 NOTE — Plan of Care (Signed)
  Problem: Metabolic: Goal: Ability to maintain appropriate glucose levels will improve Outcome: Progressing   Problem: Clinical Measurements: Goal: Cardiovascular complication will be avoided Outcome: Progressing   Problem: Coping: Goal: Level of anxiety will decrease Outcome: Progressing   Problem: Pain Managment: Goal: General experience of comfort will improve and/or be controlled Outcome: Progressing   Problem: Safety: Goal: Ability to remain free from injury will improve Outcome: Progressing

## 2023-09-11 NOTE — Discharge Instructions (Addendum)
 [facility] assistance programs. If you are behind on your bills and expenses, and need some help to make it through a short term hardship or financial emergency, there are several organizations and charities in the [place] area that may be able to help. They range from the [employer], [employer], Landscape architect of [place]  and the local community action agency, the [employer], [employer]. These groups may be able to provide you resources to help pay your utility bills, rent, and they even offer housing assistance.  Crisis assistance program Find help for paying your rent, electric bills, free food, and even funds to pay your mortgage. The [employer] [phone]) offers several services to local families, as funding allows. The Emergency Assistance Program (EAP), which they administer, provides household goods, free food, clothing, and financial aid to people in need in the [place]  area. The EAP program does have some qualification, and counselors will interview clients for financial assistance by written referral only. Referrals need to be made by the Department of Social Services or by other EAP approved human services agencies or charities in the area.  Money for resources for emergency assistance are available for security deposits for rent, water , electric, and gas, past due rent, utility bills, past due mortgage payments, food, and clothing. The [employer] also operates a Programme researcher, broadcasting/film/video on the site. More Liberty Global.  Open [employer] Ministries of [employer], which can be reached at [phone], offers emergency assistance programs for those in need of help, such as food, rent assistance, a soup kitchen, shelter, and clothing. They are based in [facility]  but provide a number of services to those that qualify for assistance. Continue with Open [employer]ries  programs.  [facility] Department of Social Services may be able to offer temporary financial assistance and cash grants for paying rent and utilities. Help may be provided for local county residents who may be experiencing personal crisis when other resources, including government programs, are not available. Call [phone]  St. Jerrell everitt Mt Society, which is based in [place], provides financial assistance of up to $50.00 to help pay for rent, utilities, cooling bills, rent, and prescription medications. The program also provides secondhand furniture to those in need. [phone]  [employer] is a Geneticist, molecular. The organization can offer emergency assistance for paying rent, electric bills, utilities, food, household products and furniture. They offer extensive emergency and transitional housing for families, children and single women, and also run a Boy's and Dole Food. [employer] Th[employer]ops, [employer], and other aid offered too. [address], [place], [place]  [address], [phone]  Additional locations of the [employer] are in [place] and other nearby communities. When you have an emergency, need free food, money for basic needs, or just need assistance around Christmas, then the [employer] Stores may have the resources you need. Or they can refer you to nearby agencies. Learn more.  [place] Low Income Risk manager - This is offered for [facility] families. The federal government created CIT Group Program provides a one-time cash grant payment to help eligible low-income families pay their electric and heating bills. [address], [place], [place]  [address], [phone]  Government and [employer] - The county administers several emergency and self-sufficiency programs. Residents of [place] can get help with energy bills and food, rent, and  other expenses. In addition,  work with a Sports coach who may be able to help you find a job or improve your employment skills. More [place] public assistance.  [place] Emergency Assistance - A program offers emergency utility and rent funds for greater [employer] area residents. The program can also provide counseling and referrals to charities and government programs. Also provides food and a free meal program that serves lunch Mondays - Saturdays and dinner seven days per week to individuals in the community. [address], [place], [place]  [address], [phone]  [employer] - Offers affordable apartment and housing communities across [place] and [place]. The low income and seniors can access public housing, rental assistance to qualified applicants, and apply for the section 8 rent subsidy program. Other programs include Chiropractor and Engineer, maintenance. [address], [place], [place]  [address], dial  [phone].  Basic needs such as clothing - Low income families can receive free items (school supplies, clothes, holiday assistance, etc.) from clothing closets while more moderate income [address] families can shop at [employer]. Locations across the area help the needy. Get information on [place] [facility] free clothing centers.  The [facility] provides transitional housing to veterans and the disabled. Clients will also access other services too, including life skills classes, case management, and assistance in finding permanent housing. [address] [place], [place]  [address], call [phone]  [place] Transitional Housing in [place] is for people who were just evicted or that are formerly homeless. The non-profit will also help then gain self-sufficiency, find a home or apartment to live in, and also provides information on rent assistance when needed. Dial  (336)  ([phone] [phone]  [employer] Partnership to End Homelessness is available in [place]. Families that were evicted or that are homeless can gain shelter, food, clothing, furniture, and also emergency financial assistance. Other services include financial skills and life skills coaching, job training, and case management. [address], [place], [place] [address]. Telephone [phone].  The [employer]ne[employer], [employer]. runs the [employer]. This can help people save money on their heating and summer cooling bills, and is free to low income families. Free upgrades can be made to your home. Phone [phone]  Many of the non-profits and programs mentioned above are all inclusive, meaning they can meet many needs of the low income, such as energy bills, food, rent, and more. However there are several organizations that focus just on rent and housing. Read more on rent assistance in [place] region.  Legal assistance for evictions, foreclosure, and more If you need free legal advice on civili issues, such as foreclosures, evictions, Electronics engineer, government programs, domestic issues and more, Armed forces operational officer Aid of [place]  [facility]) is a Associate Professor firm that provides free legal services and counsel to lower income people, seniors, disabled, and others. The goal is to ensure everyone has access to justice and fair representation.  Call them at [phone], or click here to learn more about [place]  free legal assistance programs.  [place] [employer]net and funds for emergency expenses The [employer] Stores is another organization that can provide people with [employer] and funds to pay bills. Their assistance depends on funding, and the demand for help is always very high. They can provide cash to help pay rent, a missed mortgage payment, or gas, electric, and water  bills. But the assistance doesn't stop there. They also have a food pantry  on site, which can provide  food once every three (3) months to people who need help. The [employer] can also offer a Engineering geologist once every three (3) months for a maximum three (3) times. After receiving this voucher over that period of time, applicants can receive this aid one every six (6) months after that. ([phone]) [phone].  Kohl's action agency The Intel, [employer]. offers job and Dispensing optician. Resources are focused on helping students obtain the skills and experiences that are necessary to compete in today's challenging and tight job market. The non-profit faith-based community action agency offers internship trainings as well as classroom instruction. Economically disadvantaged and challenged individuals and potential employers can use their services. Classes are tailored to meet the needs of people in the [facility] region. [place], [place] [address], [phone]    Foreclosure prevention services Housing Counseling and Education is also offered by [employer] of the [place]. The agency (phone number is below) is a Engineer, structural providing foreclosure advice and counseling. They offer mortgage resolution counseling and also reverse mortgage counseling. Counselors can direct people to both [employer], as well as [place]  foreclosure assistance options.  Warehouse manager has locations in [place] and [employer]. They run debt and foreclosure prevention programs for local families. A sampling of the programs offered include both Budget and Housing Counseling. This includes money management, financial advice, budget review and development of a written action plan with a Pensions consultant to help solve specific individual financial problems. In addition, housing and mortgage counselors can also provide pre- and post-purchase  homeownership counseling, default resolution counseling (to prevent foreclosure) and reverse mortgage counseling. A Debt Management Program allows people and families with a high level of credit card or medical debt to consolidate and repay consumer debt and loans to creditors and rebuild positive credit ratings and scores. [phone]  Debt assistance programs Receive free counseling and debt help from [facility] of the [place]. The [facility] based agency can be reached at [phone]. The counselors provide free help, and the services include budget counseling. This will help people manage their expenses and set goals. They also offer a Forensic scientist, which will help individuals consolidate their debts and become debt free. Most of the workshops and services are free.  Community clinics in [place] of the leading health and dental centers are listed below. They may be able to provide medication, physicals, dental care, and general family care to residents of all incomes and backgrounds across the region. Some of the programs focus on the low income and underinsured. However if these clinics can't meet your needs, find information and details on more clinics in [place] .  Some of the options include [employer] of [employer]. This center provides free or low cost health care to low-income adults 18 - 64, who have no health insurance. Among other services offered include a pharmacy and eye clinic. Phone [phone]  [facility], which is located in [employer], is a community clinic that provides primary medical and health care to uninsured and underinsured adults and families, as well as the low income, in the greater [place] area on a sliding-fee scale. Call [phone]  [place] Adult Dental Program - They run a dental assistance program that is organized by [facility] to provide dental services  and aid  to [employer]. Services offered by the dental clinic are limited to extractions, pain management, and minor restorative care. [phone]  [place] [facility] has locations in [facility] and [place]. The community clinics provide complete pediatric care including primary health, mental health, social work, neurology, cardiology, asthma. Dial  ([phone]) [phone].  In addition to those [place] and [employer], find other free community clinics in [place]  and across the county.  Food pantry and assistance Some of the local food pantries and distribution centers to call for free food and groceries include The Hive of [place] (phone [phone]), The [facility] (phone [phone]) and also [employer] Corporation. Dial  (336) O8691338.  Several other food banks in the region provide clothing, free food and meals, access to soup kitchens and other help. Find the addresses and phone numbers of more food pantries in [place]. http://www.needhelppayingbills.com/html/[place]_assistance_pro.html    Low Income Public Housing  [facility] Housing Authority [employer] Supported Living Apts [phone] [address] [facility] [phone]  [facility] [address], [place] [phone]  [facility] Ind [address] [place] [phone]  [facility] [address] [place]. [place] [phone]  [place] [address] [place] [phone]  [place] [address]  [address], [place] [phone]  [place] [employer] Homes [address] [place] [phone]  [facility] [address] [place] [phone]  [facility] [address], [place] [phone]  [address]  [facility] [phone]  [facility]  [address] [place] [phone]  [place] [address]  [address]  [place] [phone]   [place] [address] [place] [phone]  [place] Housing [address] [place] [phone] [address] [place] [phone]  [facility] Apts. [address], [place] [phone]  [place] [address] [facility] [address] [facility] [address] [place] [phone]  [facility] Apts. [address] [place] [phone]  Rock[employer] Apts.  [address], [place] [phone]  [facility] Apts. [address], [place] [phone] Apts.  [address] [phone]  [facility] Apts. [address] [place] [phone]  [place] [address]  [address] [facility] [phone]    Additional Discharge Instructions   Please get your medications reviewed and adjusted by your Primary MD.  Please request your Primary MD to go over all Hospital Tests and Procedure/Radiological results at the follow up, please get all Hospital records sent to your Primary MD by signing hospital release before you go home.  If you had Pneumonia of Lung problems at the Hospital: Please get a 2 view Chest X ray done in approximately 4 weeks after hospital discharge or sooner if instructed by your Primary MD.  If you have Congestive Heart Failure: Please call your Cardiologist or Primary MD anytime you have any of the following symptoms:  1) 3 pound weight gain in 24 hours or 5 pounds in 1 week  2) shortness of breath, with or without a dry hacking cough  3) swelling in the hands, feet or stomach  4) if you have to sleep on extra pillows at night in order to breathe  Follow cardiac low salt diet and 1.5 lit/day fluid restriction.  If you have diabetes Accuchecks 4 times/day, Once in AM empty stomach and then before each meal. Log in all results and show them to your primary doctor at your next  visit. If any glucose reading is  under 80 or above 300 call your primary MD immediately.  If you have Seizure/Convulsions/Epilepsy: Please do not drive, operate heavy machinery, participate in activities at heights or participate in high speed sports until you have seen by Primary MD or a Neurologist and advised to do so again. Per [place]  DMV statutes, patients with seizures are not allowed to drive until they have been seizure-free for six months.  Use caution when using heavy equipment or power tools. Avoid working on ladders or at heights. Take showers instead of baths. Ensure the water  temperature is not too high on the home water  heater. Do not go swimming alone. Do not lock yourself in a room alone (i.e. bathroom). When caring for infants or small children, sit down when holding, feeding, or changing them to minimize risk of injury to the child in the event you have a seizure. Maintain good sleep hygiene. Avoid alcohol.   If you had Gastrointestinal Bleeding: Please ask your Primary MD to check a complete blood count within one week of discharge or at your next visit. Your endoscopic/colonoscopic biopsies that are pending at the time of discharge, will also need to followed by your Primary MD.  Get Medicines reviewed and adjusted. Please take all your medications with you for your next visit with your Primary MD  Please request your Primary MD to go over all hospital tests and procedure/radiological results at the follow up, please ask your Primary MD to get all Hospital records sent to his/her office.  If you experience worsening of your admission symptoms, develop shortness of breath, life threatening emergency, suicidal or homicidal thoughts you must seek medical attention immediately by calling 911 or calling your MD immediately  if symptoms less severe.  You must read complete instructions/literature along with all the possible adverse reactions/side effects for all the Medicines you take and that have been  prescribed to you. Take any new Medicines after you have completely understood and accpet all the possible adverse reactions/side effects.   Do not drive or operate heavy machinery when taking Pain medications.   Do not take more than prescribed Pain, Sleep and Anxiety Medications  Special Instructions: If you have smoked or chewed Tobacco  in the last 2 yrs please stop smoking, stop any regular Alcohol  and or any Recreational drug use.  Wear Seat belts while driving.  Please note You were cared for by a hospitalist during your hospital stay. If you have any questions about your discharge medications or the care you received while you were in the hospital after you are discharged, you can call the unit and asked to speak with the hospitalist on call if the hospitalist that took care of you is not available. Once you are discharged, your primary care physician will handle any further medical issues. Please note that NO REFILLS for any discharge medications will be authorized once you are discharged, as it is imperative that you return to your primary care physician (or establish a relationship with a primary care physician if you do not have one) for your aftercare needs so that they can reassess your need for medications and monitor your lab values.  You can reach the hospitalist office at phone [phone] or fax [phone]   If you do not have a primary care physician, you can call [phone] for a physician referral.

## 2023-09-11 NOTE — Inpatient Diabetes Management (Signed)
 Inpatient Diabetes Program Recommendations  AACE/ADA: New Consensus Statement on Inpatient Glycemic Control (2015)  Target Ranges:  Prepandial:   less than 140 mg/dL      Peak postprandial:   less than 180 mg/dL (1-2 hours)      Critically ill patients:  140 - 180 mg/dL   Lab Results  Component Value Date   GLUCAP 157 (H) 09/11/2023   HGBA1C 7.5 (H) 06/02/2023    Latest Reference Range & Units 09/10/23 07:35 09/10/23 13:07 09/10/23 17:23 09/10/23 20:59 09/11/23 07:42  Glucose-Capillary 70 - 99 mg/dL 161 (H) 096 (H) 045 (H) 251 (H) 157 (H)  (H): Data is abnormally high  Diabetes history: DM 2 Outpatient Diabetes medications: Trulicity 3 mg Weekly, Toujeo  25 units qhs, Metformin  1000 m bid Current orders for Inpatient glycemic control:  Semglee  10 units Novolog  0-20 units tid + hs 0-5   Decadron  10 mg po and Solumedrol 125 mg x 2 doses on 09/10/23. Now PO prednisone  40 mg Daily   Inpatient Diabetes Program Recommendations:   Please consider:  - Increase Semglee  dose 20 units  Thank you, Joan Avetisyan E. Kjerstin Abrigo, RN, MSN, CDCES  Diabetes Coordinator Inpatient Glycemic Control Team Team Pager [phone] (8am-5pm) 09/11/2023 10:16 AM

## 2023-09-11 NOTE — Progress Notes (Signed)
   09/11/23 1917  BiPAP/CPAP/SIPAP  Reason BIPAP/CPAP not in use Non-compliant  BiPAP/CPAP /SiPAP Vitals  Pulse Rate (!) 123  Resp 17  SpO2 100 %  Bilateral Breath Sounds Diminished

## 2023-09-12 ENCOUNTER — Inpatient Hospital Stay (HOSPITAL_COMMUNITY): Payer: Medicare (Managed Care)

## 2023-09-12 DIAGNOSIS — I5021 Acute systolic (congestive) heart failure: Secondary | ICD-10-CM | POA: Diagnosis not present

## 2023-09-12 DIAGNOSIS — E1152 Type 2 diabetes mellitus with diabetic peripheral angiopathy with gangrene: Secondary | ICD-10-CM | POA: Diagnosis not present

## 2023-09-12 DIAGNOSIS — I429 Cardiomyopathy, unspecified: Secondary | ICD-10-CM

## 2023-09-12 DIAGNOSIS — N1831 Chronic kidney disease, stage 3a: Secondary | ICD-10-CM | POA: Diagnosis not present

## 2023-09-12 DIAGNOSIS — J441 Chronic obstructive pulmonary disease with (acute) exacerbation: Secondary | ICD-10-CM | POA: Diagnosis not present

## 2023-09-12 DIAGNOSIS — Z89512 Acquired absence of left leg below knee: Secondary | ICD-10-CM

## 2023-09-12 DIAGNOSIS — I214 Non-ST elevation (NSTEMI) myocardial infarction: Secondary | ICD-10-CM

## 2023-09-12 DIAGNOSIS — I5020 Unspecified systolic (congestive) heart failure: Secondary | ICD-10-CM

## 2023-09-12 LAB — RETICULOCYTES
Immature Retic Fract: 28.5 % — ABNORMAL HIGH (ref 2.3–15.9)
RBC.: 3.18 MIL/uL — ABNORMAL LOW (ref 3.87–5.11)
Retic Count, Absolute: 99.9 10*3/uL (ref 19.0–186.0)
Retic Ct Pct: 3.1 % (ref 0.4–3.1)

## 2023-09-12 LAB — CBC
HCT: 29.9 % — ABNORMAL LOW (ref 36.0–46.0)
Hemoglobin: 8.9 g/dL — ABNORMAL LOW (ref 12.0–15.0)
MCH: 28 pg (ref 26.0–34.0)
MCHC: 29.8 g/dL — ABNORMAL LOW (ref 30.0–36.0)
MCV: 94 fL (ref 80.0–100.0)
Platelets: 395 10*3/uL (ref 150–400)
RBC: 3.18 MIL/uL — ABNORMAL LOW (ref 3.87–5.11)
RDW: 15.5 % (ref 11.5–15.5)
WBC: 11.6 10*3/uL — ABNORMAL HIGH (ref 4.0–10.5)
nRBC: 0 % (ref 0.0–0.2)

## 2023-09-12 LAB — BASIC METABOLIC PANEL WITH GFR
Anion gap: 8 (ref 5–15)
BUN: 22 mg/dL (ref 8–23)
CO2: 21 mmol/L — ABNORMAL LOW (ref 22–32)
Calcium: 8.7 mg/dL — ABNORMAL LOW (ref 8.9–10.3)
Chloride: 105 mmol/L (ref 98–111)
Creatinine, Ser: 1.12 mg/dL — ABNORMAL HIGH (ref 0.44–1.00)
GFR, Estimated: 54 mL/min — ABNORMAL LOW (ref >60)
Glucose, Bld: 229 mg/dL — ABNORMAL HIGH (ref 70–99)
Potassium: 4.1 mmol/L (ref 3.5–5.1)
Sodium: 134 mmol/L — ABNORMAL LOW (ref 135–145)

## 2023-09-12 LAB — FERRITIN: Ferritin: 32 ng/mL (ref 11–307)

## 2023-09-12 LAB — IRON AND TIBC
Iron: 24 ug/dL — ABNORMAL LOW (ref 28–170)
Saturation Ratios: 7 % — ABNORMAL LOW (ref 10.4–31.8)
TIBC: 353 ug/dL (ref 250–450)
UIBC: 329 ug/dL

## 2023-09-12 LAB — FOLATE: Folate: 8.4 ng/mL (ref >5.9)

## 2023-09-12 LAB — GLUCOSE, CAPILLARY
Glucose-Capillary: 255 mg/dL — ABNORMAL HIGH (ref 70–99)
Glucose-Capillary: 156 mg/dL — ABNORMAL HIGH (ref 70–99)
Glucose-Capillary: 165 mg/dL — ABNORMAL HIGH (ref 70–99)
Glucose-Capillary: 168 mg/dL — ABNORMAL HIGH (ref 70–99)

## 2023-09-12 LAB — BRAIN NATRIURETIC PEPTIDE: B Natriuretic Peptide: 1360.6 pg/mL — ABNORMAL HIGH (ref 0.0–100.0)

## 2023-09-12 LAB — TROPONIN I (HIGH SENSITIVITY): Troponin I (High Sensitivity): 385 ng/L (ref <18)

## 2023-09-12 LAB — VITAMIN B12: Vitamin B-12: 667 pg/mL (ref 180–914)

## 2023-09-12 MED ORDER — DIPHENHYDRAMINE HCL 50 MG/ML IJ SOLN: 25.0000 mg | Freq: Three times a day (TID) | INTRAMUSCULAR | Status: DC | PRN

## 2023-09-12 MED ORDER — SPIRONOLACTONE 25 MG PO TABS
50.0000 mg | ORAL_TABLET | Freq: Two times a day (BID) | ORAL | Status: DC
  Administered 2023-09-12 (×2): 50 mg via ORAL
  Filled 2023-09-12 (×3): qty 2

## 2023-09-12 MED ORDER — HEPARIN BOLUS VIA INFUSION
2000.0000 [IU] | Freq: Once | INTRAVENOUS | Status: AC
  Administered 2023-09-12: 2000 [IU] via INTRAVENOUS
  Filled 2023-09-12: qty 2000

## 2023-09-12 MED ORDER — BISACODYL 5 MG PO TBEC
10.0000 mg | DELAYED_RELEASE_TABLET | Freq: Once | ORAL | Status: AC
  Administered 2023-09-12: 10 mg via ORAL
  Filled 2023-09-12: qty 2

## 2023-09-12 MED ORDER — HEPARIN (PORCINE) 25000 UT/250ML-% IV SOLN
1050.0000 [IU]/h | INTRAVENOUS | Status: DC
  Administered 2023-09-12: 1050 [IU]/h via INTRAVENOUS
  Filled 2023-09-12: qty 250

## 2023-09-12 MED ORDER — SENNOSIDES-DOCUSATE SODIUM 8.6-50 MG PO TABS
2.0000 | ORAL_TABLET | Freq: Two times a day (BID) | ORAL | Status: DC
  Administered 2023-09-12 – 2023-09-23 (×18): 2 via ORAL
  Filled 2023-09-12 (×22): qty 2

## 2023-09-12 NOTE — Progress Notes (Signed)
 Pt requested that this nurse put down the bed rail and stated granddaughter will be at bedside. Pt called out because she fell out of bed. When staff approach the pt room, pt is sitting on the right side of the bed in water  and urine on the floor. Pt is tangle up in the pulse oxygenation cord and periwick line. Pt stated that she hit her head and her right hip. Pt does had a red spot on the right her head. The staff use maxi move to transfer back into the bed. Notified Chavis, NP on called and she is ordering a CT of the head. Vitals sign: temp 98.7, HR-115, BP- 127/76, 99 RA, resp-22. The ongoing nurse is aware of the situation.

## 2023-09-12 NOTE — Plan of Care (Signed)
  Problem: Education: Goal: Ability to describe self-care measures that may prevent or decrease complications (Diabetes Survival Skills Education) will improve Outcome: Progressing   Problem: Coping: Goal: Ability to adjust to condition or change in health will improve Outcome: Progressing   Problem: Fluid Volume: Goal: Ability to maintain a balanced intake and output will improve Outcome: Progressing   Problem: Health Behavior/Discharge Planning: Goal: Ability to identify and utilize available resources and services will improve Outcome: Progressing Goal: Ability to manage health-related needs will improve Outcome: Progressing   Problem: Skin Integrity: Goal: Risk for impaired skin integrity will decrease Outcome: Progressing

## 2023-09-12 NOTE — Progress Notes (Signed)
    Patient Name: Anna [name]           DOB: [birth year]  MRN: [id number]      Admission Date: 09/09/2023  Attending Provider: Maylene Spear, MD  Primary Diagnosis: COPD with acute exacerbation [facility])   Level of care: Telemetry Medical    CROSS COVER NOTE   Date of Service   09/12/2023   [name], 68 y.o. female, was admitted on 09/09/2023 for COPD with acute exacerbation (HCC).    HPI/Events of Note   Unwitnessed fall Fall occurred while patient was resting in bed, trying to reach out for her phone.  Side rail was not up and she fell out of bed, landing on her right side.  Patient reports hitting her right forehead, right elbow, right hip. Patient was able to call out for help immediately. Vitals are hemodynamically stable.  Per RN assessment- No acute neuro changes. Remains A/O. She denies pain or discomfort.  RN does not report any visible signs of injury or deformities.  Redness and tenderness to forehead and skin abrasion to right elbow reported, otherwise at baseline.   She is wheelchair-bound, no changes to ROM reported.   Interventions/ Plan   Head CT Nursing staff to continue monitoring for change in acute symptoms, mobility, and pain. Fall precaution, floor matts, side rails, bed alarm         Bailey Lesser, DNP, ACNPC- [place] [facility] [place]

## 2023-09-12 NOTE — Progress Notes (Signed)
 IP-David Holt. RN, notified this nurse about the droplet precaution. This nurse was told pt was on droplet due to having COVID-19 two weeks ago. Myrtie Atkinson reach out to Maylene Spear MD. MD wants to keep pt on droplet because she is still coughing at this time and disregard the RSV-20 pathogen.

## 2023-09-12 NOTE — Consult Note (Signed)
 Cardiology Consultation   Patient ID: Anna [name] MRN: [id number]; DOB: [birth year]  Admit date: 09/09/2023 Date of Consult: 09/12/2023  PCP:  Sarrah Cure, DO   [facility] Providers Cardiologist:  None       Chief complaint: Cough and shortness of breath Reason of consult: Chest pain, abnormal echocardiogram  Patient Profile: Anna [name] is a 68 y.o. female with a hx of Hx of stroke, IDDM Type II, HTN, HLD, PAD s/p LBKA, CVA (march 2025), Hx of DVT, ? Hx of GI Bleeding (1984) who is being seen 09/12/2023 for the evaluation of chest pain, abnormal echocardiogram at the request of Maylene Spear, MD.  History of Present Illness: Ms. Eppes with a complex past medical history as outlined above is accompanied by her granddaughter Hayden Lipoma at bedside.  Patient states that in March 2025 she was experiencing generalized tiredness fatigue dyspnea on exertion with over activity such as wheeling her self in a wheelchair would get her profoundly short of breath.  Shortly thereafter she started noticing precordial substernally located, tightness like sensation present for the last 3 to 4 weeks brought on by over exertional activities (mobilizing herself using a wheelchair).  Associated symptoms include dyspnea on exertion.  She denies orthopnea or PND or lower extremity swelling  Patient was diagnosed with COVID-19 infection and was treated with antiviral medications.  But continues to have residual cough with sputum production  No chest pain at rest.  Because of the shortness of breath echocardiogram was performed which notes reduced left ventricular ejection fraction with regional wall motion abnormalities.  Troponins were checked they were above normal limits and therefore cardiology consulted for further evaluation and management.  Past Medical History:  Diagnosis Date   Anemia    Anxiety    Arthritis    Asthma    Back pain    Bronchitis    Bursitis    Chronic  kidney disease    COPD (chronic obstructive pulmonary disease) (HCC)    Depression    Diabetes mellitus    Diabetic neuropathy (HCC)    Diabetic retinopathy    GERD (gastroesophageal reflux disease)    Hyperlipemia    Hypertension    Migraine    Obesity    Pneumonia    Sepsis due to urinary tract infection (HCC)    Sepsis secondary to UTI (HCC) 09/30/2022   Stroke (HCC)    2020    Past Surgical History:  Procedure Laterality Date   ABDOMINAL AORTOGRAM W/LOWER EXTREMITY Left 08/22/2021   Procedure: ABDOMINAL AORTOGRAM W/LOWER EXTREMITY;  Surgeon: Margherita Shell, MD;  Location: [facility];  Service: Cardiovascular;  Laterality: Left;   AMPUTATION Left 10/11/2021   Procedure: LEFT BELOW KNEE AMPUTATION;  Surgeon: Timothy Ford, MD;  Location: [facility] OR;  Service: Orthopedics;  Laterality: Left;   CESAREAN SECTION     x3   CHOLECYSTECTOMY     CYSTOSCOPY WITH RETROGRADE PYELOGRAM, URETEROSCOPY AND STENT PLACEMENT Right 02/06/2023   Procedure: CYSTOSCOPY WITH RIGHT  RETROGRADE PYELOGRAM AND RIGHT URETERAL STENT EXCHANGE;  Surgeon: Adelbert Homans, MD;  Location: [facility] OR[facility];  Service: Urology;  Laterality: Right;  30 MINUTES   CYSTOSCOPY WITH STENT PLACEMENT Right 09/30/2022   Procedure: CYSTOSCOPY WITH STENT PLACEMENT;  Surgeon: Samson Croak, MD;  Location: [facility] OR;  Service: Urology;  Laterality: Right;   EYE SURGERY Left    retina surgery had laser   METATARSAL HEAD EXCISION Left 08/24/2021   Procedure: METATARSAL  HEAD EXCISION FIFTH TOE;  Surgeon: Charity Conch, DPM;  Location: [facility] OR;  Service: Podiatry;  Laterality: Left;   PERIPHERAL VASCULAR BALLOON ANGIOPLASTY  08/22/2021   Procedure: PERIPHERAL VASCULAR BALLOON ANGIOPLASTY;  Surgeon: Margherita Shell, MD;  Location: [facility];  Service: Cardiovascular;;  Left AT   WOUND DEBRIDEMENT Left 08/24/2021   Procedure: DEBRIDEMENT WOUND OF LEFT FOOT;  Surgeon: Charity Conch, DPM;  Location: [facility] OR;   Service: Podiatry;  Laterality: Left;       Scheduled Meds:  aspirin  EC  81 mg Oral Daily   atorvastatin   80 mg Oral QHS   benzonatate   100 mg Oral TID   busPIRone   7.5 mg Oral BID   dextromethorphan   30 mg Oral BID   enoxaparin  (LOVENOX ) injection  40 mg Subcutaneous Daily   ferrous sulfate  325 mg Oral Q breakfast   FLUoxetine   40 mg Oral Daily   gabapentin   400 mg Oral TID   guaiFENesin   600 mg Oral BID   insulin  aspart  0-20 Units Subcutaneous TID WC   insulin  aspart  0-5 Units Subcutaneous QHS   insulin  glargine-yfgn  10 Units Subcutaneous QHS   loratadine   10 mg Oral Daily   methylPREDNISolone  (SOLU-MEDROL ) injection  40 mg Intravenous Q12H   nystatin    Topical BID   pantoprazole   40 mg Oral BID   rOPINIRole   0.25 mg Oral QHS   senna-docusate  2 tablet Oral BID   spironolactone  50 mg Oral BID   tamsulosin   0.4 mg Oral Daily   topiramate   50 mg Oral BID   Continuous Infusions:  cefTRIAXone  (ROCEPHIN )  IV 1 g (09/11/23 2356)   PRN Meds: acetaminophen  **OR** acetaminophen , diphenhydrAMINE, HYDROcodone  bit-homatropine, hydrOXYzine , ipratropium-albuterol , Muscle Rub, oxyCODONE , prochlorperazine , sodium chloride  flush  Allergies:    Allergies  Allergen Reactions   Lasix [Furosemide] Shortness Of Breath and Rash   Bacitracin Other (See Comments)    Burns skin     Ciprofloxacin  Swelling   Citalopram Diarrhea   Doxycycline  Swelling and Other (See Comments)    burning all over body     Latex Itching   Morphine And Codeine Hives   Duloxetine Other (See Comments)    Mental Status Changes (intolerance) saw pictures of gun   Levaquin [Levofloxacin In D5w] Other (See Comments)    Pt does not remember reaction but states she's had issues with med   Salvia Officinalis Other (See Comments)    Sage- sneezing    Soma [Carisoprodol] Other (See Comments)    Sleepy and constipation      Social History:   Social History   Socioeconomic History   Marital status:  Divorced    Spouse name: Not on file   Number of children: Not on file   Years of education: Not on file   Highest education level: Not on file  Occupational History   Not on file  Tobacco Use   Smoking status: Never   Smokeless tobacco: Never  Vaping Use   Vaping status: Never Used  Substance and Sexual Activity   Alcohol use: No   Drug use: No   Sexual activity: Not Currently  Other Topics Concern   Not on file  Social History Narrative   Not on file   Social Drivers of Health   Financial Resource Strain: Not on file  Food Insecurity: No Food Insecurity (09/10/2023)   Hunger Vital Sign    Worried About Running Out of Food in  the Last Year: Never true    Ran Out of Food in the Last Year: Never true  Transportation Needs: No Transportation Needs (09/10/2023)   PRAPARE - Administrator, Civil Service (Medical): No    Lack of Transportation (Non-Medical): No  Physical Activity: Not on file  Stress: Not on file  Social Connections: Socially Isolated (09/10/2023)   Social Connection and Isolation Panel    Frequency of Communication with Friends and Family: Once a week    Frequency of Social Gatherings with Friends and Family: Never    Attends Religious Services: 1 to 4 times per year    Active Member of [employer] or Organizations: No    Attends Banker Meetings: Never    Marital Status: Widowed  Intimate Partner Violence: Not At Risk (09/10/2023)   Humiliation, Afraid, Rape, and Kick questionnaire    Fear of Current or Ex-Partner: No    Emotionally Abused: No    Physically Abused: No    Sexually Abused: No    Family History:   Brother has coronary artery disease  ROS:  Review of Systems  Cardiovascular:  Positive for dyspnea on exertion and leg swelling. Negative for orthopnea and paroxysmal nocturnal dyspnea.  Respiratory:  Positive for cough, shortness of breath and sputum production.      Physical Exam/Data: Vitals:   09/11/23 1917 09/11/23  2127 09/12/23 0515 09/12/23 1637  BP:  120/64 120/75 115/75  Pulse: (!) 123 (!) 107 95 (!) 108  Resp: 17 16 17 18   Temp:  (!) 97.5 F (36.4 C) 97.6 F (36.4 C) 98.5 F (36.9 C)  TempSrc:  Oral Oral Oral  SpO2: 100% 100% 100% 97%  Weight:      Height:        Intake/Output Summary (Last 24 hours) at 09/12/2023 1913 Last data filed at 09/12/2023 1843 Gross per 24 hour  Intake 1780 ml  Output 1650 ml  Net 130 ml      09/09/2023    4:56 PM 08/08/2023   11:16 AM 06/02/2023    9:54 AM  Last 3 Weights  Weight (lbs) 160 lb 154 lb 5.2 oz 154 lb 5.2 oz  Weight (kg) 72.576 kg 70 kg 70 kg     Body mass index is 29.26 kg/m.  General:  Well nourished, well developed, in no acute distress, appears older than stated age, HEENT: normal Neck: no JVD Cardiac:  normal S1, S2; RRR; no murmur, gallops or rubs appreciated Lungs:  clear to auscultation bilaterally, no wheezing, rhonchi or rales  Abd: soft, nontender, no hepatomegaly  Ext: no edema, left BKA Musculoskeletal:  No deformities, BUE and BLE strength normal and equal Skin: warm and dry  Neuro:  CNs 2-12 intact, no focal abnormalities noted Psych:  Normal affect   EKG:  The EKG was personally reviewed and demonstrates:   September 12, 2023: Sinus rhythm, 100 bpm, poor R wave progression, without underlying ischemia or injury pattern  Telemetry:  Telemetry was personally reviewed and demonstrates: Sinus rhythm/sinus tachycardia  Relevant CV Studies: Echo 09/11/2023  1. No apical thrombus with Definity contrast. Left ventricular ejection  fraction, by estimation, is 40 to 45%. The left ventricle has mildly  decreased function. The left ventricle demonstrates regional wall motion  abnormalities (see scoring  diagram/findings for description). Left ventricular diastolic parameters  are consistent with Grade I diastolic dysfunction (impaired relaxation).  There is severe hypokinesis of the left ventricular, entire inferior wall,  anterior  segment, inferolateral  wall and lateral wall. The apical lateral wall is hyperdynamic.   2. Right ventricular systolic function is moderately reduced. The right  ventricular size is normal. There is normal pulmonary artery systolic  pressure. The estimated right ventricular systolic pressure is 30.2 mmHg.   3. The mitral valve is grossly normal. Trivial mitral valve  regurgitation.   4. The aortic valve was not well visualized. Aortic valve regurgitation  is not visualized. No aortic stenosis is present.   5. The inferior vena cava is dilated in size with <50% respiratory  variability, suggesting right atrial pressure of 15 mmHg.   Comparison(s): Changes from prior study are noted. 06/03/2023: LVEF 60-65%.   Laboratory Data: High Sensitivity Troponin:   Recent Labs  Lab 09/12/23 0921  TROPONINIHS 385*     Chemistry Recent Labs  Lab 09/10/23 0528 09/11/23 0620 09/12/23 0335  NA 135 135 134*  K 3.8 4.8 4.1  CL 106 106 105  CO2 17* 21* 21*  GLUCOSE 404* 161* 229*  BUN 14 15 22   CREATININE 1.24* 1.05* 1.12*  CALCIUM  8.2* 8.5* 8.7*  GFRNONAA 47* 58* 54*  ANIONGAP 12 8 8     No results for input(s): PROT, ALBUMIN, AST, ALT, ALKPHOS, BILITOT in the last 168 hours. Lipids No results for input(s): CHOL, TRIG, HDL, LABVLDL, LDLCALC, CHOLHDL in the last 168 hours.  Hematology Recent Labs  Lab 09/10/23 0528 09/11/23 0620 09/12/23 0335  WBC 11.4* 13.1* 11.6*  RBC 3.42* 3.22* 3.18*  3.18*  HGB 9.6* 9.0* 8.9*  HCT 32.2* 29.9* 29.9*  MCV 94.2 92.9 94.0  MCH 28.1 28.0 28.0  MCHC 29.8* 30.1 29.8*  RDW 15.4 15.4 15.5  PLT 430* 395 395   Thyroid No results for input(s): TSH, FREET4 in the last 168 hours.  BNPNo results for input(s): BNP, PROBNP in the last 168 hours.  DDimer No results for input(s): DDIMER in the last 168 hours.  Radiology/Studies:  ECHOCARDIOGRAM COMPLETE Result Date: 09/11/2023    ECHOCARDIOGRAM REPORT   Patient Name:    CORIANNE BUCCELLATO Igou Date of Exam: 09/11/2023 Medical Rec #:  [id number]        Height:       62.0 in Accession #:    [id number]       Weight:       160.0 lb Date of Birth:  1955-12-17       BSA:          1.739 m Patient Age:    67 years         BP:           132/79 mmHg Patient Gender: F                HR:           99 bpm. Exam Location:  Inpatient Procedure: 2D Echo, Intracardiac Opacification Agent, Cardiac Doppler and Color            Doppler (Both Spectral and Color Flow Doppler were utilized during            procedure). Indications:    dyspnea  History:        Patient has prior history of Echocardiogram examinations, most                 recent 06/03/2023.  Sonographer:    Griselda Lederer Referring Phys: [phone] [facility]  Sonographer Comments: Technically difficult study due to poor echo windows. IMPRESSIONS  1. No apical thrombus with Definity contrast. Left  ventricular ejection fraction, by estimation, is 40 to 45%. The left ventricle has mildly decreased function. The left ventricle demonstrates regional wall motion abnormalities (see scoring diagram/findings for description). Left ventricular diastolic parameters are consistent with Grade I diastolic dysfunction (impaired relaxation). There is severe hypokinesis of the left ventricular, entire inferior wall, anterior segment, inferolateral wall and lateral wall. The apical lateral wall is hyperdynamic.  2. Right ventricular systolic function is moderately reduced. The right ventricular size is normal. There is normal pulmonary artery systolic pressure. The estimated right ventricular systolic pressure is 30.2 mmHg.  3. The mitral valve is grossly normal. Trivial mitral valve regurgitation.  4. The aortic valve was not well visualized. Aortic valve regurgitation is not visualized. No aortic stenosis is present.  5. The inferior vena cava is dilated in size with <50% respiratory variability, suggesting right atrial pressure of 15 mmHg. Comparison(s): Changes  from prior study are noted. 06/03/2023: LVEF 60-65%. Conclusion(s)/Recommendation(s): Findings suggest probable LCX territory ischemia/infarct. FINDINGS  Left Ventricle: No apical thrombus with Definity contrast. Left ventricular ejection fraction, by estimation, is 40 to 45%. The left ventricle has mildly decreased function. The left ventricle demonstrates regional wall motion abnormalities. Severe hypokinesis of the left ventricular, entire inferior wall, anterior segment, inferolateral wall and lateral wall. The left ventricular internal cavity size was normal in size. There is no left ventricular hypertrophy. Left ventricular diastolic parameters are consistent with Grade I diastolic dysfunction (impaired relaxation). Indeterminate filling pressures.  LV Wall Scoring: The entire lateral wall is hypokinetic. The apical lateral wall is hyperdynamic. Right Ventricle: The right ventricular size is normal. No increase in right ventricular wall thickness. Right ventricular systolic function is moderately reduced. There is normal pulmonary artery systolic pressure. The tricuspid regurgitant velocity is 1.95 m/s, and with an assumed right atrial pressure of 15 mmHg, the estimated right ventricular systolic pressure is 30.2 mmHg. Left Atrium: Left atrial size was normal in size. Right Atrium: Right atrial size was normal in size. Pericardium: There is no evidence of pericardial effusion. Mitral Valve: The mitral valve is grossly normal. Trivial mitral valve regurgitation. Tricuspid Valve: The tricuspid valve is grossly normal. Tricuspid valve regurgitation is mild. Aortic Valve: The aortic valve was not well visualized. Aortic valve regurgitation is not visualized. No aortic stenosis is present. Pulmonic Valve: The pulmonic valve was normal in structure. Pulmonic valve regurgitation is not visualized. Aorta: The aortic root and ascending aorta are structurally normal, with no evidence of dilitation. Venous: The inferior  vena cava is dilated in size with less than 50% respiratory variability, suggesting right atrial pressure of 15 mmHg. IAS/Shunts: No atrial level shunt detected by color flow Doppler.   LV Volumes (MOD) LV vol d, MOD A2C: 88.0 ml Diastology LV vol d, MOD A4C: 93.5 ml LV e' medial:   7.46 cm/s LV vol s, MOD A2C: 48.2 ml LV E/e' medial: 12.6 LV vol s, MOD A4C: 30.2 ml LV SV MOD A2C:     39.8 ml LV SV MOD A4C:     93.5 ml LV SV MOD BP:      52.0 ml RIGHT VENTRICLE            IVC RV S prime:     7.00 cm/s  IVC diam: 2.10 cm TAPSE (M-mode): 1.2 cm LEFT ATRIUM             Index LA Vol (A2C):   59.2 ml 34.05 ml/m LA Vol (A4C):   46.8 ml 26.92 ml/m LA Biplane Vol:  53.2 ml 30.60 ml/m  MITRAL VALVE                TRICUSPID VALVE MV Area (PHT): 6.07 cm     TR Peak grad:   15.2 mmHg MV Decel Time: 125 msec     TR Vmax:        195.00 cm/s MV E velocity: 93.80 cm/s MV A velocity: 101.00 cm/s MV E/A ratio:  0.93 Dinah Franco MD Electronically signed by Dinah Franco MD Signature Date/Time: 09/11/2023/4:47:49 PM    Final    CT Angio Chest Pulmonary Embolism (PE) W or WO Contrast Result Date: 09/11/2023 CLINICAL DATA:  Cough, dyspnea, recent COVID EXAM: CT ANGIOGRAPHY CHEST WITH CONTRAST TECHNIQUE: Multidetector CT imaging of the chest was performed using the standard protocol during bolus administration of intravenous contrast. Multiplanar CT image reconstructions and MIPs were obtained to evaluate the vascular anatomy. RADIATION DOSE REDUCTION: This exam was performed according to the departmental dose-optimization program which includes automated exposure control, adjustment of the mA and/or kV according to patient size and/or use of iterative reconstruction technique. CONTRAST:  60mL OMNIPAQUE  IOHEXOL  350 MG/ML SOLN COMPARISON:  09/09/2023, 09/30/2022 FINDINGS: Cardiovascular: This is a technically adequate evaluation of the pulmonary vasculature. No filling defects or pulmonary emboli. The heart is unremarkable without  pericardial effusion. Normal caliber of the thoracic aorta. Atherosclerosis of the aorta and coronary vasculature. Mediastinum/Nodes: Fluid-filled distal thoracic esophagus may be due to reflux. Thyroid and trachea are unremarkable. Borderline enlarged mediastinal lymph nodes are identified, measuring up to 11 mm in the right paratracheal region and 15 mm in the subcarinal region, likely reactive. Lungs/Pleura: Small bilateral pleural effusions, estimated less than 500 cc each. Minimal scattered linear and ground-glass opacities, greatest in the mid and lower lung zones, compatible with given history of recent COVID infection. No dense consolidation or pneumothorax. 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 Upper Abdomen: There is trace free fluid in the right upper quadrant with mild periportal edema, new since prior abdominal CT 08/08/2023. This is nonspecific. Musculoskeletal: No acute or destructive bony abnormalities. Reconstructed images demonstrate no additional findings. Review of the MIP images confirms the above findings. IMPRESSION: 1. No evidence of pulmonary embolus. 2. Scattered basilar predominant interstitial and ground-glass opacities compatible with given history of recent COVID pneumonia. 3. Small bilateral pleural effusions. 4. Borderline enlarged mediastinal adenopathy, likely reactive. 5. Nonspecific mild periportal edema and trace free fluid right upper quadrant, new since prior abdominal CT. 6. Aortic Atherosclerosis (ICD10-I70.0). Coronary artery atherosclerosis. 7. Findings suggesting underlying tracheobronchomalacia. Electronically Signed   By: Bobbye Burrow M.D.   On: 09/11/2023 15:01   VAS US  LOWER EXTREMITY VENOUS (DVT) (7a-7p) Result Date: 09/10/2023  Lower Venous DVT Study Patient Name:  ALLURE GREASER  Date of Exam:   09/10/2023 Medical Rec #: [id number]         Accession #:    [id number] Date of  Birth: 01/01/1956        Patient Gender: F Patient Age:   74 years Exam Location:  [facility] Procedure:      VAS US  LOWER EXTREMITY VENOUS (DVT) Referring Phys: TIMOTHY OPYD --------------------------------------------------------------------------------  Indications: Pain, Swelling, and Right foot. Other Indications: Diabetes, left BKA. Comparison Study: No priors. Performing Technologist: Franky Ivanoff Sturdivant-Jones RDMS, RVT  Examination Guidelines: A complete evaluation includes B-mode imaging, spectral Doppler, color Doppler, and power Doppler as needed of all accessible portions of each vessel. Bilateral testing is considered an integral  part of a complete examination. Limited examinations for reoccurring indications may be performed as noted. The reflux portion of the exam is performed with the patient in reverse Trendelenburg.  +---------+---------------+---------+-----------+----------+--------------+ RIGHT    CompressibilityPhasicitySpontaneityPropertiesThrombus Aging +---------+---------------+---------+-----------+----------+--------------+ CFV      Full           Yes      Yes                                 +---------+---------------+---------+-----------+----------+--------------+ SFJ      Full                                                        +---------+---------------+---------+-----------+----------+--------------+ FV Prox  Full                                                        +---------+---------------+---------+-----------+----------+--------------+ FV Mid   Full                                                        +---------+---------------+---------+-----------+----------+--------------+ FV DistalFull                                                        +---------+---------------+---------+-----------+----------+--------------+ PFV      Full                                                         +---------+---------------+---------+-----------+----------+--------------+ POP      Full           Yes      Yes                                 +---------+---------------+---------+-----------+----------+--------------+ PTV      Full                                                        +---------+---------------+---------+-----------+----------+--------------+ PERO     Full                                                        +---------+---------------+---------+-----------+----------+--------------+   +----+---------------+---------+-----------+----------+--------------+ LEFTCompressibilityPhasicitySpontaneityPropertiesThrombus Aging +----+---------------+---------+-----------+----------+--------------+ CFV Full  Yes      Yes                                 +----+---------------+---------+-----------+----------+--------------+ SFJ Full                                                        +----+---------------+---------+-----------+----------+--------------+    Summary: RIGHT: - There is no evidence of deep vein thrombosis in the lower extremity.  - No cystic structure found in the popliteal fossa.  LEFT: - No evidence of common femoral vein obstruction.   *See table(s) above for measurements and observations. Electronically signed by Runell Countryman on 09/10/2023 at 4:30:57 PM.    Final    DG Chest 2 View Result Date: 09/09/2023 EXAM: 2 VIEW(S) XRAY OF THE CHEST 09/09/2023 07:55:00 PM COMPARISON: 08/08/2023 CLINICAL HISTORY: Cough. SOB, cough for over 2 weeks. History of COPD. FINDINGS: LUNGS AND PLEURA: No focal pulmonary opacity. No pulmonary edema. No pleural effusion. No pneumothorax. HEART AND MEDIASTINUM: No acute abnormality of the cardiac and mediastinal silhouettes. BONES AND SOFT TISSUES: Mild degenerative changes of the mid/lower thoracic spine. Cholecystectomy clips. No acute osseous abnormality. IMPRESSION: 1. No acute process. Electronically  signed by: Zadie Herter MD 09/09/2023 08:02 PM EDT RP Workstation: [id number]     Assessment and Plan: NSTEMI Symptoms consistent with anginal discomfort Echocardiogram notes reduced LVEF with regional wall motion abnormalities Elevated troponins Continues to have pain with exertional activities such as mobilizing herself using her wheelchair No active chest pain Telemetry does not illustrate any arrhythmia IV heparin  per ACS protocol Informed Consent   Shared Decision Making/Informed Consent The risks [stroke (1 in 1000), death (1 in 1000), kidney failure [usually temporary] (1 in 500), bleeding (1 in 200), allergic reaction [possibly serious] (1 in 200)], benefits (diagnostic support and management of coronary artery disease) and alternatives of a cardiac catheterization were discussed in detail with Ms. Winchel and she is willing to proceed.  Continue aspirin  81 mg p.o. daily. Continue Lipitor  80 mg p.o. daily. EKG if chest pain occurs Reemphasize importance of glycemic control.  Heart failure with reduced EF Likely secondary to ischemic cardiomyopathy, workup ongoing Echocardiogram independently reviewed, LVEF 35-40% with regional wall motion abnormalities Overall euvolemic. Check BNP. Will uptitrate GDMT Noted to be allergic to Lasix Will titrate diuretics based on BNP   Peripheral artery disease status post BKA: Chronic and stable.  Follows with vascular surgery  History of stroke: Reemphasized the importance of secondary prevention with focus on improving the modifiable cardiovascular risk factors such as glycemic control, lipid management, blood pressure control, weight loss.  Insulin -dependent diabetes mellitus type 2: Currently on insulin  therapy.  Hypertension: Spironolactone 50 mg p.o. twice daily  Hyperlipidemia: High intensity statin  Recent COVID-19 infection  Complex medical decision making regarding NSTEMI and newly discovered  cardiomyopathy Independently reviewed EKG, labs 09/12/2023, echocardiogram, prescription drug management, shared decision with regards to ischemic workup, consented for left heart catheterization with possible intervention, plan of care discussed with patient and granddaughter Molly at bedside.  Recommendations conveyed to attending physician as well as nursing staff.  Risk Assessment/Risk Scores:    TIMI Risk Score for Unstable Angina or Non-ST Elevation MI:   The patient's TIMI risk score is 5, which indicates a  26% risk of all cause mortality, new or recurrent myocardial infarction or need for urgent revascularization in the next 14 days.  New York  Heart Association (NYHA) Functional Class NYHA Class III   For questions or updates, please contact [facility] Please consult [email or web address] for contact info under    Signed, Olinda Bertrand, DO  09/12/2023 7:13 PM

## 2023-09-12 NOTE — Progress Notes (Addendum)
 [facility] PROGRESS NOTE   VIERA OKONSKI ZOX:[id number] DOB: 29-Jan-1956 DOA: 09/09/2023  PCP: Sarrah Cure, DO  Brief History: 68 y.o. female with medical history significant for hypertension, insulin -dependent diabetes mellitus, COPD, history of CVA, depression, anxiety, and reported COVID infection 2 and 1/2 weeks prior to admission who presented with cough and shortness of breath.  For her COVID-19 infection she was given a course of molnupiravir.  Her symptoms improved but worsened again for which she was given doxycycline .  Chest x-ray was negative for acute findings.  Patient was hospitalized for further management.  Consultants: None  Procedures: None    Subjective/Interval History: Patient mentions that she is feeling slightly better compared to yesterday.  Cough is better.  Shortness of breath is about the same.  She was told about her echocardiogram findings.  She mentions that she is allergic to furosemide.     Assessment/Plan:  COPD with acute exacerbation Noted to have wheezing bilaterally. She does have a known history of COPD but does not smoke cigarettes anymore. Continue with Solu-Medrol .  Continue with ceftriaxone .  Recently completed course of doxycycline .  Chest x-ray did not show any acute findings.  CT angiogram did not show any PE.  Small bilateral pleural effusions were noted.  See below regarding abnormal echocardiogram. Continue nebulizer treatments.  Doppler studies are negative for DVT.  Procalcitonin level was less than 0.1.  CRP 0.5.  Acute systolic CHF An echocardiogram was done which shows diminished EF of 40 to 45%.  This is a change compared to echo from March of this year. LCx territory ischemia is suspected. Denies any history of heart disease.  Recent COVID-19 could be responsible for decrease in her LVEF.  EKG does not show any ischemic changes.  Troponin level is pending. Will benefit from diuretics due to presence of pleural  effusion and some volume overload.  However patient mentions that she is allergic to furosemide.  This limits treatment options.  Will start her on spironolactone. Will benefit from cardiology evaluation but this can be pursued in the outpatient setting.  If troponin levels elevated then will consult cardiology in the hospital. Patient is already on aspirin  and statin.  Patient is already on ACE inhibitor which is currently on hold.  If renal function remains stable this can be resumed. Hold off on beta-blocker till her respiratory status improves. LDL was 114 in March.  Will recheck tomorrow morning.  Recent COVID-19 infection Treated with molnupiravir.  Concern for yeast infection She was given a dose of Diflucan .  Chronic kidney disease stage III A Renal function close to baseline.  Monitor urine output.  Diabetes mellitus type 2, uncontrolled with hyperglycemia Home medication regimen include dulaglutide, Toujeo , NovoLog , metformin . Currently on SSI and glargine.  Anticipate poor glycemic control due to steroid use.  Normocytic anemia Drop in hemoglobin is possibly dilutional.  No evidence of overt bleeding.  Anemia panel shows ferritin of 32, iron 24, TIBC 353, percent saturation 7.  Folic acid 8.4.  Vitamin B12 667.  Will benefit from iron supplementation at discharge.  Further workup in the outpatient setting.  DVT Prophylaxis: Lovenox  Code Status: Full code Family Communication: Discussed with patient Disposition Plan: Hopefully return home when improved    Medications: Scheduled:  aspirin  EC  81 mg Oral Daily   atorvastatin   80 mg Oral QHS   benzonatate   100 mg Oral TID   bisacodyl   10 mg Oral Once   busPIRone   7.5 mg Oral BID  dextromethorphan   30 mg Oral BID   enoxaparin  (LOVENOX ) injection  40 mg Subcutaneous Daily   ferrous sulfate  325 mg Oral Q breakfast   FLUoxetine   40 mg Oral Daily   gabapentin   400 mg Oral TID   guaiFENesin   600 mg Oral BID   insulin   aspart  0-20 Units Subcutaneous TID WC   insulin  aspart  0-5 Units Subcutaneous QHS   insulin  glargine-yfgn  10 Units Subcutaneous QHS   loratadine   10 mg Oral Daily   methylPREDNISolone  (SOLU-MEDROL ) injection  40 mg Intravenous Q12H   nystatin    Topical BID   pantoprazole   40 mg Oral BID   rOPINIRole   0.25 mg Oral QHS   senna-docusate  2 tablet Oral BID   spironolactone  50 mg Oral BID   tamsulosin   0.4 mg Oral Daily   topiramate   50 mg Oral BID   Continuous:  cefTRIAXone  (ROCEPHIN )  IV 1 g (09/11/23 2356)   PRN:acetaminophen  **OR** acetaminophen , diphenhydrAMINE, HYDROcodone  bit-homatropine, hydrOXYzine , ipratropium-albuterol , Muscle Rub, oxyCODONE , prochlorperazine , sodium chloride  flush  Antibiotics: Anti-infectives (From admission, onward)    Start     Dose/Rate Route Frequency Ordered Stop   09/10/23 1700  fluconazole  (DIFLUCAN ) tablet 150 mg        150 mg Oral  Once 09/10/23 1603 09/10/23 1736   09/10/23 0130  cefTRIAXone  (ROCEPHIN ) 1 g in sodium chloride  0.9 % 100 mL IVPB        1 g 200 mL/hr over 30 Minutes Intravenous Daily at bedtime 09/10/23 0115 09/14/23 2159       Objective:  Vital Signs  Vitals:   09/11/23 1707 09/11/23 1917 09/11/23 2127 09/12/23 0515  BP: 136/78  120/64 120/75  Pulse: (!) 124 (!) 123 (!) 107 95  Resp: 17 17 16 17   Temp: 97.8 F (36.6 C)  (!) 97.5 F (36.4 C) 97.6 F (36.4 C)  TempSrc: Oral  Oral Oral  SpO2: 100% 100% 100% 100%  Weight:      Height:        Intake/Output Summary (Last 24 hours) at 09/12/2023 0923 Last data filed at 09/12/2023 0800 Gross per 24 hour  Intake --  Output 1300 ml  Net -1300 ml   Filed Weights   09/09/23 1656  Weight: 72.6 kg    General appearance: Awake alert.  In no distress Resp: Less tachypneic today compared to yesterday.  Improved air entry bilaterally.  Few crackles at the bases. Cardio: S1-S2 is normal regular.  No S3-S4.  No rubs murmurs or bruit GI: Abdomen is soft.  Nontender  nondistended.  Bowel sounds are present normal.  No masses organomegaly Extremities: Mild pedal edema No focal neurological deficits.   Lab Results:  Data Reviewed: I have personally reviewed following labs and reports of the imaging studies  CBC: Recent Labs  Lab 09/09/23 1931 09/10/23 0528 09/11/23 0620 09/12/23 0335  WBC 11.4* 11.4* 13.1* 11.6*  NEUTROABS 6.3  --   --   --   HGB 9.8* 9.6* 9.0* 8.9*  HCT 33.3* 32.2* 29.9* 29.9*  MCV 95.4 94.2 92.9 94.0  PLT 489* 430* 395 395    Basic Metabolic Panel: Recent Labs  Lab 09/09/23 1931 09/10/23 0528 09/11/23 0620 09/12/23 0335  NA 139 135 135 134*  K 4.2 3.8 4.8 4.1  CL 106 106 106 105  CO2 22 17* 21* 21*  GLUCOSE 179* 404* 161* 229*  BUN 13 14 15 22   CREATININE 1.19* 1.24* 1.05* 1.12*  CALCIUM  8.6* 8.2*  8.5* 8.7*    GFR: Estimated Creatinine Clearance: 44.9 mL/min (A) (by C-G formula based on SCr of 1.12 mg/dL (H)).  CBG: Recent Labs  Lab 09/11/23 0742 09/11/23 1207 09/11/23 1702 09/11/23 2128 09/12/23 0749  GLUCAP 157* 115* 315* 315* 255*     Recent Results (from the past 240 hours)  Urine Culture     Status: Abnormal   Collection Time: 09/09/23 11:36 PM   Specimen: Urine, Random  Result Value Ref Range Status   Specimen Description URINE, RANDOM  Final   Special Requests   Final    NONE Reflexed from [id number] Performed at [facility], [address] [place], [place] [address]    Culture MULTIPLE SPECIES PRESENT, SUGGEST RECOLLECTION (A)  Final   Report Status 09/10/2023 FINAL  Final      Radiology Studies: ECHOCARDIOGRAM COMPLETE Result Date: 09/11/2023    ECHOCARDIOGRAM REPORT   Patient Name:   HAROLDINE REDLER Lowenthal Date of Exam: 09/11/2023 Medical Rec #:  [id number]        Height:       62.0 in Accession #:    [id number]       Weight:       160.0 lb Date of Birth:  10/09/1955       BSA:          1.739 m Patient Age:    67 years         BP:           132/79 mmHg Patient Gender: F                 HR:           99 bpm. Exam Location:  Inpatient Procedure: 2D Echo, Intracardiac Opacification Agent, Cardiac Doppler and Color            Doppler (Both Spectral and Color Flow Doppler were utilized during            procedure). Indications:    dyspnea  History:        Patient has prior history of Echocardiogram examinations, most                 recent 06/03/2023.  Sonographer:    Griselda Lederer Referring Phys: [phone] [facility]  Sonographer Comments: Technically difficult study due to poor echo windows. IMPRESSIONS  1. No apical thrombus with Definity contrast. Left ventricular ejection fraction, by estimation, is 40 to 45%. The left ventricle has mildly decreased function. The left ventricle demonstrates regional wall motion abnormalities (see scoring diagram/findings for description). Left ventricular diastolic parameters are consistent with Grade I diastolic dysfunction (impaired relaxation). There is severe hypokinesis of the left ventricular, entire inferior wall, anterior segment, inferolateral wall and lateral wall. The apical lateral wall is hyperdynamic.  2. Right ventricular systolic function is moderately reduced. The right ventricular size is normal. There is normal pulmonary artery systolic pressure. The estimated right ventricular systolic pressure is 30.2 mmHg.  3. The mitral valve is grossly normal. Trivial mitral valve regurgitation.  4. The aortic valve was not well visualized. Aortic valve regurgitation is not visualized. No aortic stenosis is present.  5. The inferior vena cava is dilated in size with <50% respiratory variability, suggesting right atrial pressure of 15 mmHg. Comparison(s): Changes from prior study are noted. 06/03/2023: LVEF 60-65%. Conclusion(s)/Recommendation(s): Findings suggest probable LCX territory ischemia/infarct. FINDINGS  Left Ventricle: No apical thrombus with Definity contrast. Left ventricular ejection fraction, by estimation, is 40 to 45%.  The left ventricle has  mildly decreased function. The left ventricle demonstrates regional wall motion abnormalities. Severe hypokinesis of the left ventricular, entire inferior wall, anterior segment, inferolateral wall and lateral wall. The left ventricular internal cavity size was normal in size. There is no left ventricular hypertrophy. Left ventricular diastolic parameters are consistent with Grade I diastolic dysfunction (impaired relaxation). Indeterminate filling pressures.  LV Wall Scoring: The entire lateral wall is hypokinetic. The apical lateral wall is hyperdynamic. Right Ventricle: The right ventricular size is normal. No increase in right ventricular wall thickness. Right ventricular systolic function is moderately reduced. There is normal pulmonary artery systolic pressure. The tricuspid regurgitant velocity is 1.95 m/s, and with an assumed right atrial pressure of 15 mmHg, the estimated right ventricular systolic pressure is 30.2 mmHg. Left Atrium: Left atrial size was normal in size. Right Atrium: Right atrial size was normal in size. Pericardium: There is no evidence of pericardial effusion. Mitral Valve: The mitral valve is grossly normal. Trivial mitral valve regurgitation. Tricuspid Valve: The tricuspid valve is grossly normal. Tricuspid valve regurgitation is mild. Aortic Valve: The aortic valve was not well visualized. Aortic valve regurgitation is not visualized. No aortic stenosis is present. Pulmonic Valve: The pulmonic valve was normal in structure. Pulmonic valve regurgitation is not visualized. Aorta: The aortic root and ascending aorta are structurally normal, with no evidence of dilitation. Venous: The inferior vena cava is dilated in size with less than 50% respiratory variability, suggesting right atrial pressure of 15 mmHg. IAS/Shunts: No atrial level shunt detected by color flow Doppler.   LV Volumes (MOD) LV vol d, MOD A2C: 88.0 ml Diastology LV vol d, MOD A4C: 93.5 ml LV e' medial:   7.46 cm/s LV vol  s, MOD A2C: 48.2 ml LV E/e' medial: 12.6 LV vol s, MOD A4C: 30.2 ml LV SV MOD A2C:     39.8 ml LV SV MOD A4C:     93.5 ml LV SV MOD BP:      52.0 ml RIGHT VENTRICLE            IVC RV S prime:     7.00 cm/s  IVC diam: 2.10 cm TAPSE (M-mode): 1.2 cm LEFT ATRIUM             Index LA Vol (A2C):   59.2 ml 34.05 ml/m LA Vol (A4C):   46.8 ml 26.92 ml/m LA Biplane Vol: 53.2 ml 30.60 ml/m  MITRAL VALVE                TRICUSPID VALVE MV Area (PHT): 6.07 cm     TR Peak grad:   15.2 mmHg MV Decel Time: 125 msec     TR Vmax:        195.00 cm/s MV E velocity: 93.80 cm/s MV A velocity: 101.00 cm/s MV E/A ratio:  0.93 Dinah Franco MD Electronically signed by Dinah Franco MD Signature Date/Time: 09/11/2023/4:47:49 PM    Final    CT Angio Chest Pulmonary Embolism (PE) W or WO Contrast Result Date: 09/11/2023 CLINICAL DATA:  Cough, dyspnea, recent COVID EXAM: CT ANGIOGRAPHY CHEST WITH CONTRAST TECHNIQUE: Multidetector CT imaging of the chest was performed using the standard protocol during bolus administration of intravenous contrast. Multiplanar CT image reconstructions and MIPs were obtained to evaluate the vascular anatomy. RADIATION DOSE REDUCTION: This exam was performed according to the departmental dose-optimization program which includes automated exposure control, adjustment of the mA and/or kV according to patient size and/or use of iterative reconstruction technique.  CONTRAST:  60mL OMNIPAQUE  IOHEXOL  350 MG/ML SOLN COMPARISON:  09/09/2023, 09/30/2022 FINDINGS: Cardiovascular: This is a technically adequate evaluation of the pulmonary vasculature. No filling defects or pulmonary emboli. The heart is unremarkable without pericardial effusion. Normal caliber of the thoracic aorta. Atherosclerosis of the aorta and coronary vasculature. Mediastinum/Nodes: Fluid-filled distal thoracic esophagus may be due to reflux. Thyroid and trachea are unremarkable. Borderline enlarged mediastinal lymph nodes are identified, measuring  up to 11 mm in the right paratracheal region and 15 mm in the subcarinal region, likely reactive. Lungs/Pleura: Small bilateral pleural effusions, estimated less than 500 cc each. Minimal scattered linear and ground-glass opacities, greatest in the mid and lower lung zones, compatible with given history of recent COVID infection. No dense consolidation or pneumothorax. 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 Upper Abdomen: There is trace free fluid in the right upper quadrant with mild periportal edema, new since prior abdominal CT 08/08/2023. This is nonspecific. Musculoskeletal: No acute or destructive bony abnormalities. Reconstructed images demonstrate no additional findings. Review of the MIP images confirms the above findings. IMPRESSION: 1. No evidence of pulmonary embolus. 2. Scattered basilar predominant interstitial and ground-glass opacities compatible with given history of recent COVID pneumonia. 3. Small bilateral pleural effusions. 4. Borderline enlarged mediastinal adenopathy, likely reactive. 5. Nonspecific mild periportal edema and trace free fluid right upper quadrant, new since prior abdominal CT. 6. Aortic Atherosclerosis (ICD10-I70.0). Coronary artery atherosclerosis. 7. Findings suggesting underlying tracheobronchomalacia. Electronically Signed   By: Bobbye Burrow M.D.   On: 09/11/2023 15:01   VAS US  LOWER EXTREMITY VENOUS (DVT) (7a-7p) Result Date: 09/10/2023  Lower Venous DVT Study Patient Name:  REAGHAN KAWA  Date of Exam:   09/10/2023 Medical Rec #: [id number]         Accession #:    [id number] Date of Birth: 1955/10/02        Patient Gender: F Patient Age:   11 years Exam Location:  [facility] Procedure:      VAS US  LOWER EXTREMITY VENOUS (DVT) Referring Phys: TIMOTHY OPYD --------------------------------------------------------------------------------  Indications: Pain, Swelling, and Right  foot. Other Indications: Diabetes, left BKA. Comparison Study: No priors. Performing Technologist: Franky Ivanoff Sturdivant-Jones RDMS, RVT  Examination Guidelines: A complete evaluation includes B-mode imaging, spectral Doppler, color Doppler, and power Doppler as needed of all accessible portions of each vessel. Bilateral testing is considered an integral part of a complete examination. Limited examinations for reoccurring indications may be performed as noted. The reflux portion of the exam is performed with the patient in reverse Trendelenburg.  +---------+---------------+---------+-----------+----------+--------------+ RIGHT    CompressibilityPhasicitySpontaneityPropertiesThrombus Aging +---------+---------------+---------+-----------+----------+--------------+ CFV      Full           Yes      Yes                                 +---------+---------------+---------+-----------+----------+--------------+ SFJ      Full                                                        +---------+---------------+---------+-----------+----------+--------------+ FV Prox  Full                                                        +---------+---------------+---------+-----------+----------+--------------+  FV Mid   Full                                                        +---------+---------------+---------+-----------+----------+--------------+ FV DistalFull                                                        +---------+---------------+---------+-----------+----------+--------------+ PFV      Full                                                        +---------+---------------+---------+-----------+----------+--------------+ POP      Full           Yes      Yes                                 +---------+---------------+---------+-----------+----------+--------------+ PTV      Full                                                         +---------+---------------+---------+-----------+----------+--------------+ PERO     Full                                                        +---------+---------------+---------+-----------+----------+--------------+   +----+---------------+---------+-----------+----------+--------------+ LEFTCompressibilityPhasicitySpontaneityPropertiesThrombus Aging +----+---------------+---------+-----------+----------+--------------+ CFV Full           Yes      Yes                                 +----+---------------+---------+-----------+----------+--------------+ SFJ Full                                                        +----+---------------+---------+-----------+----------+--------------+    Summary: RIGHT: - There is no evidence of deep vein thrombosis in the lower extremity.  - No cystic structure found in the popliteal fossa.  LEFT: - No evidence of common femoral vein obstruction.   *See table(s) above for measurements and observations. Electronically signed by Runell Countryman on 09/10/2023 at 4:30:57 PM.    Final        LOS: 2 days   Maylene Spear  [facility] Pager on [email or web address]  09/12/2023, 9:23 AM

## 2023-09-12 NOTE — Plan of Care (Signed)
  Problem: Fluid Volume: Goal: Ability to maintain a balanced intake and output will improve Outcome: Progressing   Problem: Nutritional: Goal: Maintenance of adequate nutrition will improve Outcome: Progressing   Problem: Clinical Measurements: Goal: Respiratory complications will improve Outcome: Progressing Goal: Cardiovascular complication will be avoided Outcome: Not Progressing   Problem: Nutrition: Goal: Adequate nutrition will be maintained Outcome: Progressing   Problem: Elimination: Goal: Will not experience complications related to urinary retention Outcome: Progressing  Able to wean pt off oxygen, she has been steady at 96-98 % on room air.

## 2023-09-12 NOTE — Progress Notes (Signed)
 PHARMACY - ANTICOAGULATION CONSULT NOTE  Pharmacy Consult for heparin  Indication: chest pain/ACS  Allergies  Allergen Reactions   Lasix [Furosemide] Shortness Of Breath and Rash   Bacitracin Other (See Comments)    Burns skin     Ciprofloxacin  Swelling   Citalopram Diarrhea   Doxycycline  Swelling and Other (See Comments)    burning all over body     Latex Itching   Morphine And Codeine Hives   Duloxetine Other (See Comments)    Mental Status Changes (intolerance) saw pictures of gun   Levaquin [Levofloxacin In D5w] Other (See Comments)    Pt does not remember reaction but states she's had issues with med   Salvia Officinalis Other (See Comments)    Sage- sneezing    Soma [Carisoprodol] Other (See Comments)    Sleepy and constipation      Patient Measurements: Height: 5' 2 (157.5 cm) Weight: 72.6 kg (160 lb) IBW/kg (Calculated) : 50.1 HEPARIN  DW (KG): 65.6  Vital Signs: Temp: 98.5 F (36.9 C) (06/19 1637) Temp Source: Oral (06/19 1637) BP: 115/75 (06/19 1637) Pulse Rate: 108 (06/19 1637)  Labs: Recent Labs    09/10/23 0528 09/11/23 0620 09/12/23 0335 09/12/23 0921  HGB 9.6* 9.0* 8.9*  --   HCT 32.2* 29.9* 29.9*  --   PLT 430* 395 395  --   CREATININE 1.24* 1.05* 1.12*  --   TROPONINIHS  --   --   --  385*    Estimated Creatinine Clearance: 44.9 mL/min (A) (by C-G formula based on SCr of 1.12 mg/dL (H)).   Medical History: Past Medical History:  Diagnosis Date   Anemia    Anxiety    Arthritis    Asthma    Back pain    Bronchitis    Bursitis    Chronic kidney disease    COPD (chronic obstructive pulmonary disease) (HCC)    Depression    Diabetes mellitus    Diabetic neuropathy (HCC)    Diabetic retinopathy    GERD (gastroesophageal reflux disease)    Hyperlipemia    Hypertension    Migraine    Obesity    Pneumonia    Sepsis due to urinary tract infection (HCC)    Sepsis secondary to UTI (HCC) 09/30/2022   Stroke (HCC)    2020     Assessment: 86 yoF admitted with PNA found to have elevated troponins. Pharmacy consulted for IV heparin  dosing. Pt received enoxaparin  ppx dose earlier ~1000, CBC low but stable, no AC PTA.  Goal of Therapy:  Heparin  level 0.3-0.7 units/ml Monitor platelets by anticoagulation protocol: Yes   Plan:  -Heparin  2000 units x1 - smaller bolus with recent enoxaparin  -Heparin  1050 units/hr -Check 6-hr heparin  level -Monitor heparin  level, CBC, S/Sx bleeding daily  Levin Reamer, PharmD, BCPS, [facility] Clinical Pharmacist [phone] Please check AMION for all [facility] Pharmacy numbers 09/12/2023

## 2023-09-13 ENCOUNTER — Inpatient Hospital Stay (HOSPITAL_COMMUNITY)
Admission: EM | Disposition: A | Discharge status: Skilled Nursing Facility | Payer: Self-pay | Source: Home / Self Care | Attending: Internal Medicine

## 2023-09-13 DIAGNOSIS — E782 Mixed hyperlipidemia: Secondary | ICD-10-CM

## 2023-09-13 DIAGNOSIS — Z89512 Acquired absence of left leg below knee: Secondary | ICD-10-CM

## 2023-09-13 DIAGNOSIS — I739 Peripheral vascular disease, unspecified: Secondary | ICD-10-CM | POA: Diagnosis not present

## 2023-09-13 DIAGNOSIS — I5021 Acute systolic (congestive) heart failure: Secondary | ICD-10-CM | POA: Diagnosis not present

## 2023-09-13 DIAGNOSIS — I214 Non-ST elevation (NSTEMI) myocardial infarction: Secondary | ICD-10-CM

## 2023-09-13 DIAGNOSIS — I10 Essential (primary) hypertension: Secondary | ICD-10-CM

## 2023-09-13 DIAGNOSIS — Z8673 Personal history of transient ischemic attack (TIA), and cerebral infarction without residual deficits: Secondary | ICD-10-CM | POA: Diagnosis not present

## 2023-09-13 HISTORY — PX: LEFT HEART CATH AND CORONARY ANGIOGRAPHY: CATH118249

## 2023-09-13 LAB — CBC
HCT: 29.7 % — ABNORMAL LOW (ref 36.0–46.0)
Hemoglobin: 9 g/dL — ABNORMAL LOW (ref 12.0–15.0)
MCH: 28.5 pg (ref 26.0–34.0)
MCHC: 30.3 g/dL (ref 30.0–36.0)
MCV: 94 fL (ref 80.0–100.0)
Platelets: 397 10*3/uL (ref 150–400)
RBC: 3.16 MIL/uL — ABNORMAL LOW (ref 3.87–5.11)
RDW: 15.6 % — ABNORMAL HIGH (ref 11.5–15.5)
WBC: 12.9 10*3/uL — ABNORMAL HIGH (ref 4.0–10.5)
nRBC: 0 % (ref 0.0–0.2)

## 2023-09-13 LAB — GLUCOSE, CAPILLARY
Glucose-Capillary: 154 mg/dL — ABNORMAL HIGH (ref 70–99)
Glucose-Capillary: 168 mg/dL — ABNORMAL HIGH (ref 70–99)
Glucose-Capillary: 207 mg/dL — ABNORMAL HIGH (ref 70–99)

## 2023-09-13 LAB — RESPIRATORY PANEL BY PCR

## 2023-09-13 LAB — BASIC METABOLIC PANEL WITH GFR
Anion gap: 10 (ref 5–15)
BUN: 24 mg/dL — ABNORMAL HIGH (ref 8–23)
CO2: 20 mmol/L — ABNORMAL LOW (ref 22–32)
Calcium: 8.8 mg/dL — ABNORMAL LOW (ref 8.9–10.3)
Chloride: 108 mmol/L (ref 98–111)
Creatinine, Ser: 1.14 mg/dL — ABNORMAL HIGH (ref 0.44–1.00)
GFR, Estimated: 52 mL/min — ABNORMAL LOW (ref >60)
Glucose, Bld: 136 mg/dL — ABNORMAL HIGH (ref 70–99)
Potassium: 3.9 mmol/L (ref 3.5–5.1)
Sodium: 138 mmol/L (ref 135–145)

## 2023-09-13 LAB — SARS CORONAVIRUS 2 BY RT PCR: SARS Coronavirus 2 by RT PCR: NEGATIVE

## 2023-09-13 LAB — HEPARIN LEVEL (UNFRACTIONATED)
Heparin Unfractionated: >1.1 [IU]/mL — ABNORMAL HIGH (ref 0.30–0.70)
Heparin Unfractionated: 0.69 [IU]/mL (ref 0.30–0.70)

## 2023-09-13 LAB — SEDIMENTATION RATE: Sed Rate: 11 mm/h (ref 0–22)

## 2023-09-13 MED ORDER — MIDAZOLAM HCL 2 MG/2ML IJ SOLN
INTRAMUSCULAR | Status: AC
  Filled 2023-09-13: qty 2

## 2023-09-13 MED ORDER — POLYETHYLENE GLYCOL 3350 17 G PO PACK: 17.0000 g | PACK | Freq: Two times a day (BID) | ORAL | Status: DC | PRN

## 2023-09-13 MED ORDER — IOHEXOL 350 MG/ML SOLN
INTRAVENOUS | Status: DC | PRN
  Administered 2023-09-13: 35 mL via INTRA_ARTERIAL

## 2023-09-13 MED ORDER — LOSARTAN POTASSIUM 25 MG PO TABS
25.0000 mg | ORAL_TABLET | Freq: Every evening | ORAL | Status: DC
  Administered 2023-09-14 – 2023-09-18 (×5): 25 mg via ORAL
  Filled 2023-09-13 (×5): qty 1

## 2023-09-13 MED ORDER — FENTANYL CITRATE (PF) 100 MCG/2ML IJ SOLN
INTRAMUSCULAR | Status: DC | PRN
  Administered 2023-09-13: 25 ug via INTRAVENOUS

## 2023-09-13 MED ORDER — LIDOCAINE HCL (PF) 1 % IJ SOLN
INTRAMUSCULAR | Status: DC | PRN
Start: 2023-09-13 — End: 2023-09-13
  Administered 2023-09-13: 10 mL
  Administered 2023-09-13: 2 mL

## 2023-09-13 MED ORDER — SPIRONOLACTONE 25 MG PO TABS
25.0000 mg | ORAL_TABLET | Freq: Every day | ORAL | Status: DC
  Administered 2023-09-14 – 2023-09-19 (×6): 25 mg via ORAL
  Filled 2023-09-13 (×6): qty 1

## 2023-09-13 MED ORDER — MIDAZOLAM HCL 2 MG/2ML IJ SOLN
INTRAMUSCULAR | Status: DC | PRN
Start: 2023-09-13 — End: 2023-09-13
  Administered 2023-09-13: 1 mg via INTRAVENOUS

## 2023-09-13 MED ORDER — SODIUM CHLORIDE 0.9 % WEIGHT BASED INFUSION: 1.0000 mL/kg/h | INTRAVENOUS | Status: AC

## 2023-09-13 MED ORDER — CARMEX CLASSIC LIP BALM EX OINT
TOPICAL_OINTMENT | CUTANEOUS | Status: DC | PRN
  Filled 2023-09-13: qty 10

## 2023-09-13 MED ORDER — HEPARIN (PORCINE) 25000 UT/250ML-% IV SOLN
1100.0000 [IU]/h | INTRAVENOUS | Status: DC
  Administered 2023-09-14: 1050 [IU]/h via INTRAVENOUS
  Administered 2023-09-14: 1200 [IU]/h via INTRAVENOUS
  Administered 2023-09-15 – 2023-09-16 (×2): 1000 [IU]/h via INTRAVENOUS
  Administered 2023-09-17: 1100 [IU]/h via INTRAVENOUS
  Filled 2023-09-13 (×5): qty 250

## 2023-09-13 MED ORDER — OXYCODONE HCL 5 MG PO TABS
5.0000 mg | ORAL_TABLET | Freq: Once | ORAL | Status: AC
  Administered 2023-09-13: 5 mg via ORAL
  Filled 2023-09-13: qty 1

## 2023-09-13 MED ORDER — SODIUM CHLORIDE 0.9 % WEIGHT BASED INFUSION: 1.0000 mL/kg/h | INTRAVENOUS | Status: DC

## 2023-09-13 MED ORDER — FENTANYL CITRATE (PF) 100 MCG/2ML IJ SOLN
INTRAMUSCULAR | Status: AC
  Filled 2023-09-13: qty 2

## 2023-09-13 MED ORDER — LIDOCAINE HCL (PF) 1 % IJ SOLN
INTRAMUSCULAR | Status: AC
Start: 2023-09-13 — End: 2023-09-13
  Filled 2023-09-13: qty 30

## 2023-09-13 MED ORDER — VERAPAMIL HCL 2.5 MG/ML IV SOLN
INTRAVENOUS | Status: AC
  Filled 2023-09-13: qty 2

## 2023-09-13 MED ORDER — POLYETHYLENE GLYCOL 3350 17 G PO PACK
17.0000 g | PACK | Freq: Two times a day (BID) | ORAL | Status: AC
  Administered 2023-09-14: 17 g via ORAL
  Filled 2023-09-13 (×2): qty 1

## 2023-09-13 MED ORDER — BUMETANIDE 0.25 MG/ML IJ SOLN
1.0000 mg | Freq: Every day | INTRAMUSCULAR | Status: DC
  Administered 2023-09-13 – 2023-09-15 (×3): 1 mg via INTRAVENOUS
  Filled 2023-09-13 (×5): qty 4

## 2023-09-13 MED ORDER — HEPARIN (PORCINE) IN NACL 1000-0.9 UT/500ML-% IV SOLN
INTRAVENOUS | Status: DC | PRN
Start: 2023-09-13 — End: 2023-09-13
  Administered 2023-09-13: 1000 mL via SURGICAL_CAVITY

## 2023-09-13 MED ORDER — VERAPAMIL HCL 2.5 MG/ML IV SOLN: INTRAVENOUS | Status: DC | PRN

## 2023-09-13 MED ORDER — ASPIRIN 81 MG PO CHEW
81.0000 mg | CHEWABLE_TABLET | ORAL | Status: DC
  Filled 2023-09-13: qty 1

## 2023-09-13 NOTE — H&P (View-Only) (Signed)
 Progress Note  Patient Name: Anna [name] Date of Encounter: 09/13/2023 [name] Cardiologist: None Dr.Adellyn Capek (new)  Interval Summary   No active CP at rest Fall yesterday night Granddaughter molly at bedside  +cough, Shortness of breath, swelling.   Vital Signs Vitals:   09/12/23 1637 09/12/23 2003 09/13/23 0333 09/13/23 0719  BP: 115/75 127/76 113/72 (!) 105/54  Pulse: (!) 108 (!) 115 89 (!) 105  Resp: 18 (!) 22 18 16   Temp: 98.5 F (36.9 C) 97.8 F (36.6 C) 98 F (36.7 C) 97.9 F (36.6 C)  TempSrc: Oral Oral Oral Oral  SpO2: 97% 99% 98% 98%  Weight:      Height:        Intake/Output Summary (Last 24 hours) at 09/13/2023 0858 Last data filed at 09/12/2023 1843 Gross per 24 hour  Intake 1780 ml  Output 1000 ml  Net 780 ml      09/09/2023    4:56 PM 08/08/2023   11:16 AM 06/02/2023    9:54 AM  Last 3 Weights  Weight (lbs) 160 lb 154 lb 5.2 oz 154 lb 5.2 oz  Weight (kg) 72.576 kg 70 kg 70 kg      Telemetry/ECG  Sinus Tachycardia - Personally Reviewed  Physical Exam  GEN: No acute distress.   Neck: + JVD Cardiac: Tachycardia, positive S1-S2 no murmurs, rubs, or gallops.  Respiratory: Clear to auscultation bilaterally. GI: Soft, nontender, non-distended  MS: No edema, left BKA  Assessment & Plan  NSTEMI  Symptoms of cardiac chest pain w/ exertion on going for weeks.  Hs Troponins elevated. EKG does not show any significant q waves - I suspect she has obstructive disease / subacute event.  Echocardiogram independently reviewed, LVEF 35 to 40% with regional wall motion abnormalities Telemetry independently reviewed this morning. No active CP - but brought on  Plan IV heparin  drip Scheduled for left heart catheterization with possible intervention later today.  No questions or concerns.  Granddaughter Molly at bedside Continue aspirin  81 mg p.o. daily. Continue Lipitor  80 mg milligrams p.o. daily  HFrEF  Newly discovered, acute  Stage C,  NYHA class II/III Clinically appears to be euvolemic with BNP levels tend to be high Patient is allergic to Lasix which caused her to have shortness of breath and rash.   Consulted w/ inpatient pharmacy and based on records reviewed she has not received lasix. Will try Bumex 1mg  IVP.  Patient was on spironolactone 50 mg p.o. twice daily.   Will down titrated 25 mg p.o. daily so GDMT can be uptitrated. Will start losartan 25 mg p.o. every afternoon, eventually entresto. Not ideal candidate for SGLT2 inhibitors given history of urinary tract infection/sepsis Will further uptitrate GDMT.Aaron Aas  Peripheral artery disease status post BKA: Chronic and stable follows with vascular surgery  History of stroke: Reemphasized the importance of secondary prevention with focus on improving the modifiable cardiovascular risk factors such as glycemic control, lipid management, blood pressure control, weight loss.  Insulin -dependent diabetes mellitus type 2: Currently on insulin   Hypertension: Medication changes as discussed  Hyperlipidemia: Continue Lipitor  80 mg p.o. daily  Recent COVID-19 infection: Has received antivirals & primary follow-up  Medical Decision Making: High Uptitrate GDMT for her heart failure with reduced EF Patient was consented for left heart catheterization with possible interventions yesterday, no new questions or concerns Vital signs, telemetry and labs from 09/13/2023 independently reviewed Progress notes over the last 24 hours by nursing staff primary team reviewed. Given her allergies reached  out the pharmacy over secure chat prior medication changes.    For questions or updates, please contact [facility] Please consult [email or web address] for contact info under       Signed, Awilda Bogus, [facility]  A Division of [facility] [address] [place], [place] [address]  [place], [place] [address] Pager: [phone] Office: [phone]

## 2023-09-13 NOTE — Progress Notes (Signed)
 PHARMACY - ANTICOAGULATION CONSULT NOTE  Pharmacy Consult for heparin  Indication: multivessel CAD  Allergies  Allergen Reactions   Lasix [Furosemide] Shortness Of Breath and Rash   Bacitracin Other (See Comments)    Burns skin     Ciprofloxacin  Swelling   Citalopram Diarrhea   Doxycycline  Swelling and Other (See Comments)    burning all over body     Latex Itching   Morphine And Codeine Hives   Duloxetine Other (See Comments)    Mental Status Changes (intolerance) saw pictures of gun   Levaquin [Levofloxacin In D5w] Other (See Comments)    Pt does not remember reaction but states she's had issues with med   Salvia Officinalis Other (See Comments)    Sage- sneezing    Soma [Carisoprodol] Other (See Comments)    Sleepy and constipation      Patient Measurements: Height: 5' 2 (157.5 cm) Weight: 78.6 kg (173 lb 4.5 oz) IBW/kg (Calculated) : 50.1 HEPARIN  DW (KG): 67.4  Vital Signs: Temp: 97.5 F (36.4 C) (06/20 1832) Temp Source: Axillary (06/20 1832) BP: 117/78 (06/20 1832) Pulse Rate: 96 (06/20 1832)  Labs: Recent Labs    09/11/23 0620 09/12/23 0335 09/12/23 0921 09/13/23 0408 09/13/23 0805 09/13/23 1229  HGB 9.0* 8.9*  --  9.0*  --   --   HCT 29.9* 29.9*  --  29.7*  --   --   PLT 395 395  --  397  --   --   HEPARINUNFRC  --   --   --   --  >1.10* 0.69  CREATININE 1.05* 1.12*  --  1.14*  --   --   TROPONINIHS  --   --  385*  --   --   --     Estimated Creatinine Clearance: 45.9 mL/min (A) (by C-G formula based on SCr of 1.14 mg/dL (H)).   Assessment: 44 yoF admitted with PNA found to have elevated troponins. Pharmacy consulted for IV heparin  dosing. Pt received enoxaparin  ppx dose earlier ~1000, CBC low but stable, no AC PTA.  Pt s/p cath which shows multivessel CAD. TCTS consulted. To restart heparin  8 hours post sheath removal while decisions being made regarding care. Sheath was removed in the cath lab ~1640.  Goal of Therapy:  Heparin   level 0.3-0.7 units/ml Monitor platelets by anticoagulation protocol: Yes   Plan:  At 0000, start heparin  1050 units/hr Will f/u heparin  level 8 hours post restart Daily heparin  level and CBC F/u TCTS consult  Enrigue Harvard, PharmD, BCPS Please see amion for complete clinical pharmacist phone list 09/13/2023

## 2023-09-13 NOTE — Interval H&P Note (Signed)
 History and Physical Interval Note:  09/13/2023 3:51 PM  Anna [name]  has presented today for surgery, with the diagnosis of NSTEMI.  The various methods of treatment have been discussed with the patient and family. After consideration of risks, benefits and other options for treatment, the patient has consented to  Procedure(s): LEFT HEART CATH AND CORONARY ANGIOGRAPHY (N/A) as a surgical intervention.  The patient's history has been reviewed, patient examined, no change in status, stable for surgery.  I have reviewed the patient's chart and labs.  Questions were answered to the patient's satisfaction.     Advith Martine J Annye Forrey

## 2023-09-13 NOTE — Plan of Care (Signed)
  Problem: Health Behavior/Discharge Planning: Goal: Ability to identify and utilize available resources and services will improve Outcome: Progressing   Problem: Fluid Volume: Goal: Ability to maintain a balanced intake and output will improve Outcome: Progressing   Problem: Coping: Goal: Ability to adjust to condition or change in health will improve Outcome: Progressing   Problem: Health Behavior/Discharge Planning: Goal: Ability to manage health-related needs will improve Outcome: Progressing   Problem: Activity: Goal: Risk for activity intolerance will decrease Outcome: Progressing   Problem: Pain Managment: Goal: General experience of comfort will improve and/or be controlled Outcome: Progressing   Problem: Safety: Goal: Ability to remain free from injury will improve Outcome: Progressing

## 2023-09-13 NOTE — Progress Notes (Signed)
   Heart Failure Stewardship Pharmacist Progress Note   PCP: Sarrah Cure, DO PCP-Cardiologist: None    HPI:  59 YOF with PMH of HTN, DM, COPD, CVA, depression, anxiety, s/p BKA. Recent self-reported COVID infection 2 1/2 weeks ago.  Presented to ED with cough and dyspnea. Received molnupiravir, symptoms initially improved then worsened, then given doxy. CXR showed no acute finding.  ECHO showed LVEF 40-45% (previously 60-65% 05/2023), with mildly decreased function, grade I diastolic dysfunction, severe hypokinesis. Right ventricle systolic function is moderately reduced.  On exam, patient experiencing SOB, cough, bilateral lower extremity swelling present.  L heart cath scheduled for today.   Current HF Medications: Diuretic: bumetanide 1 mg IV daily ACE/ARB/ARNI: losartan 25 mg daily MRA: spironolactone 25 mg daily  Prior to admission HF Medications: ACE/ARB/ARNI: lisinopril  5 mg daily  Pertinent Lab Values: Serum creatinine 1.14, BUN 24, Potassium 3.9, Sodium 138, BNP 1360, A1c 7.5 (05/2023)  Vital Signs: Weight: admission weight: 160 lbs - incomplete Blood pressure: 110/50-70 ----- BP 132/90 while in the room Heart rate: 104  I/O: net +0.1L yesterday; net -0.84L since admission  Medication Assistance / Insurance Benefits Check: Does the patient have prescription insurance?  Yes Type of insurance plan: [employer]   Outpatient Pharmacy:  Prior to admission outpatient pharmacy: [facility] - [place], [place] - [address] Is the patient willing to use [facility] TOC pharmacy at discharge? Pending Is the patient willing to transition their outpatient pharmacy to utilize a [facility] outpatient pharmacy?   No   Assessment: 1. 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 NYHA class III symptoms. - Continue spironolactone 25 mg daily.  - Continue bumetanide 1 mg IV daily. -- had conversation about furosemide allergy and patient stated she  swelled up. Patient is tolerating bumetanide, continue with this - Continue losartan 25 mg daily - Daily weight. Strict I/O's - Keep K > 4, Mg > 2.    Plan: 1) Medication changes recommended at this time: - Consider starting metoprolol  succinate 25 mg daily when euvolemic - Avoid SGLTi - history of chronic UTI  2) Patient assistance: - Has insurance with [employer]   3)  Education  - Initial education provided. - To be completed prior to discharge  Winnie Haver, PharmD Candidate [address] [employer] 09/13/2023 2:16 PM

## 2023-09-13 NOTE — Progress Notes (Signed)
 Progress Note  Patient Name: Anna [name] Date of Encounter: 09/13/2023 [name] Cardiologist: None Dr.Adellyn Capek (new)  Interval Summary   No active CP at rest Fall yesterday night Granddaughter molly at bedside  +cough, Shortness of breath, swelling.   Vital Signs Vitals:   09/12/23 1637 09/12/23 2003 09/13/23 0333 09/13/23 0719  BP: 115/75 127/76 113/72 (!) 105/54  Pulse: (!) 108 (!) 115 89 (!) 105  Resp: 18 (!) 22 18 16   Temp: 98.5 F (36.9 C) 97.8 F (36.6 C) 98 F (36.7 C) 97.9 F (36.6 C)  TempSrc: Oral Oral Oral Oral  SpO2: 97% 99% 98% 98%  Weight:      Height:        Intake/Output Summary (Last 24 hours) at 09/13/2023 0858 Last data filed at 09/12/2023 1843 Gross per 24 hour  Intake 1780 ml  Output 1000 ml  Net 780 ml      09/09/2023    4:56 PM 08/08/2023   11:16 AM 06/02/2023    9:54 AM  Last 3 Weights  Weight (lbs) 160 lb 154 lb 5.2 oz 154 lb 5.2 oz  Weight (kg) 72.576 kg 70 kg 70 kg      Telemetry/ECG  Sinus Tachycardia - Personally Reviewed  Physical Exam  GEN: No acute distress.   Neck: + JVD Cardiac: Tachycardia, positive S1-S2 no murmurs, rubs, or gallops.  Respiratory: Clear to auscultation bilaterally. GI: Soft, nontender, non-distended  MS: No edema, left BKA  Assessment & Plan  NSTEMI  Symptoms of cardiac chest pain w/ exertion on going for weeks.  Hs Troponins elevated. EKG does not show any significant q waves - I suspect she has obstructive disease / subacute event.  Echocardiogram independently reviewed, LVEF 35 to 40% with regional wall motion abnormalities Telemetry independently reviewed this morning. No active CP - but brought on  Plan IV heparin  drip Scheduled for left heart catheterization with possible intervention later today.  No questions or concerns.  Granddaughter Molly at bedside Continue aspirin  81 mg p.o. daily. Continue Lipitor  80 mg milligrams p.o. daily  HFrEF  Newly discovered, acute  Stage C,  NYHA class II/III Clinically appears to be euvolemic with BNP levels tend to be high Patient is allergic to Lasix which caused her to have shortness of breath and rash.   Consulted w/ inpatient pharmacy and based on records reviewed she has not received lasix. Will try Bumex 1mg  IVP.  Patient was on spironolactone 50 mg p.o. twice daily.   Will down titrated 25 mg p.o. daily so GDMT can be uptitrated. Will start losartan 25 mg p.o. every afternoon, eventually entresto. Not ideal candidate for SGLT2 inhibitors given history of urinary tract infection/sepsis Will further uptitrate GDMT.Aaron Aas  Peripheral artery disease status post BKA: Chronic and stable follows with vascular surgery  History of stroke: Reemphasized the importance of secondary prevention with focus on improving the modifiable cardiovascular risk factors such as glycemic control, lipid management, blood pressure control, weight loss.  Insulin -dependent diabetes mellitus type 2: Currently on insulin   Hypertension: Medication changes as discussed  Hyperlipidemia: Continue Lipitor  80 mg p.o. daily  Recent COVID-19 infection: Has received antivirals & primary follow-up  Medical Decision Making: High Uptitrate GDMT for her heart failure with reduced EF Patient was consented for left heart catheterization with possible interventions yesterday, no new questions or concerns Vital signs, telemetry and labs from 09/13/2023 independently reviewed Progress notes over the last 24 hours by nursing staff primary team reviewed. Given her allergies reached  out the pharmacy over secure chat prior medication changes.    For questions or updates, please contact [facility] Please consult [email or web address] for contact info under       Signed, Awilda Bogus, [facility]  A Division of [facility] [address] [place], [place] [address]  [place], [place] [address] Pager: [phone] Office: [phone]

## 2023-09-13 NOTE — Progress Notes (Addendum)
 PHARMACY - ANTICOAGULATION CONSULT NOTE  Pharmacy Consult for heparin  Indication: chest pain/ACS  Allergies  Allergen Reactions   Lasix [Furosemide] Shortness Of Breath and Rash   Bacitracin Other (See Comments)    Burns skin     Ciprofloxacin  Swelling   Citalopram Diarrhea   Doxycycline  Swelling and Other (See Comments)    burning all over body     Latex Itching   Morphine And Codeine Hives   Duloxetine Other (See Comments)    Mental Status Changes (intolerance) saw pictures of gun   Levaquin [Levofloxacin In D5w] Other (See Comments)    Pt does not remember reaction but states she's had issues with med   Salvia Officinalis Other (See Comments)    Sage- sneezing    Soma [Carisoprodol] Other (See Comments)    Sleepy and constipation      Patient Measurements: Height: 5' 2 (157.5 cm) Weight: 72.6 kg (160 lb) IBW/kg (Calculated) : 50.1 HEPARIN  DW (KG): 65.6  Vital Signs: Temp: 97.9 F (36.6 C) (06/20 0719) Temp Source: Oral (06/20 0719) BP: 105/54 (06/20 0719) Pulse Rate: 105 (06/20 0719)  Labs: Recent Labs    09/11/23 0620 09/12/23 0335 09/12/23 0921 09/13/23 0408 09/13/23 0805 09/13/23 1229  HGB 9.0* 8.9*  --  9.0*  --   --   HCT 29.9* 29.9*  --  29.7*  --   --   PLT 395 395  --  397  --   --   HEPARINUNFRC  --   --   --   --  >1.10* 0.69  CREATININE 1.05* 1.12*  --  1.14*  --   --   TROPONINIHS  --   --  385*  --   --   --     Estimated Creatinine Clearance: 44.1 mL/min (A) (by C-G formula based on SCr of 1.14 mg/dL (H)).   Medical History: Past Medical History:  Diagnosis Date   Anemia    Anxiety    Arthritis    Asthma    Back pain    Bronchitis    Bursitis    Chronic kidney disease    COPD (chronic obstructive pulmonary disease) (HCC)    Depression    Diabetes mellitus    Diabetic neuropathy (HCC)    Diabetic retinopathy    GERD (gastroesophageal reflux disease)    Hyperlipemia    Hypertension    Migraine    Obesity     Pneumonia    Sepsis due to urinary tract infection (HCC)    Sepsis secondary to UTI (HCC) 09/30/2022   Stroke (HCC)    2020    Assessment: 53 yoF admitted with PNA found to have elevated troponins. Pharmacy consulted for IV heparin  dosing. Pt received enoxaparin  ppx dose earlier ~1000, CBC low but stable, no AC PTA.  Initial heparin  level 0.69 - drawn appropriately from opposite arm. (Level reported as >1.1 was contaminated with heparin  as drawn from same site). CBC low-stable. No bleeding reported. [facility]earth cath later today or tomorrow.  Goal of Therapy:  Heparin  level 0.3-0.7 units/ml Monitor platelets by anticoagulation protocol: Yes   Plan:  Continue Heparin  1050 units/hr Repeat Heparin  level in 6 hours to rule out accumulation vs follow-up after cath.  Daily Heparin  level and CBC while on therapy Monitor for any signs or symptoms of bleeding daily  Lenard Quam, PharmD, BCPS, BCCCP Clinical Pharmacist Please refer to [facility] for [facility] Pharmacy numbers 09/13/2023

## 2023-09-13 NOTE — Progress Notes (Signed)
 PROGRESS NOTE  Anna [name] YQM:[id number] DOB: 1955-09-02   PCP: Sarrah Cure, DO  Patient is from: Home.  DOA: 09/09/2023 LOS: 3  Chief complaints Chief Complaint  Patient presents with   Cough     Brief Narrative / Interim history: 68 year old F with PMH of COPD, CVA, IDDM-2, left BKA, HTN, anxiety, depression and reported COVID-19 infection about 2 and half weeks prior for which she was treated with Paxlovid  presents with shortness of breath and cough, and admitted with COPD exacerbation.  In ED, she was slightly tachycardic and tachypneic. Cr 1.19.  WBC 11.4.  Hgb 9.8.  Platelet 484.  UA with large LE.  CXR without acute finding.  A 20 pathogen RVP nonreactive.  Patient was started on Solu-Medrol , ceftriaxone  and nebulizers and admitted.  CT angio chest negative for PE but scattered basilar predominant interstitial and groundglass opacities compatible with given history of recent COVID-19 pneumonia, borderline enlarged mediastinal adenopathy likely reactive and nonspecific mild periportal edema.  TTE with LVEF of 40 to 45%, RWMA, G1 DD and RVSP of 30 mmHg.  BNP elevated to 1300.  Troponin elevated to 385.  Cardiology consulted, and plan for LHC.  COVID-19 swab nonreactive.  Subjective: Seen and examined earlier this morning.  Patient fell of the bed when she was trying to reach her for last night.  No apparent injury or focal neurodeficit.  CT head without acute finding.  Continues to endorse dry cough and abdominal pain from the cough.  Denies shortness of breath or chest pain.  Denies nausea or vomiting.  She says she has not a bowel movement since Monday.  Objective: Vitals:   09/12/23 1637 09/12/23 2003 09/13/23 0333 09/13/23 0719  BP: 115/75 127/76 113/72 (!) 105/54  Pulse: (!) 108 (!) 115 89 (!) 105  Resp: 18 (!) 22 18 16   Temp: 98.5 F (36.9 C) 97.8 F (36.6 C) 98 F (36.7 C) 97.9 F (36.6 C)  TempSrc: Oral Oral Oral Oral  SpO2: 97% 99% 98% 98%  Weight:       Height:        Examination:  GENERAL: No apparent distress.  Nontoxic. HEENT: MMM.  Vision and hearing grossly intact.  NECK: Supple.  No apparent JVD.  RESP:  No IWOB.  Frequently coughing.  Rhonchi.  CVS:  RRR. Heart sounds normal.  ABD/GI/GU: BS+. Abd soft, NTND.  MSK/EXT:  Moves extremities.  Left BKA.  Trace RLE edema. SKIN: no apparent skin lesion or wound NEURO: AA.  Oriented appropriately.  No apparent focal neuro deficit. PSYCH: Calm. Normal affect.   Consultants:  Cardiology  Procedures: None  Microbiology summarized: Urine culture with multiple species A-20 pathogen RVP nonreactive COVID-19 PCR nonreactive  Assessment and plan: COPD with acute exacerbation in the setting of recent COVID-19 infection: Still with significant dry cough and rhonchi.  CXR negative.  Pro-Cal negative.  CT angio chest as above. -Continue Solu-Medrol  and nebulizers. -Mucolytic's and antitussive. -Discontinue ceftriaxone  -Check CRP and ESR   Acute sHFmrEF: TTE with LVEF of 40 to 45%, RWMA and RVSP of 30 mmHg.  No acute ischemic changes on EKG.  BNP elevated to 1300.  Troponin elevated 385.  Allergic to furosemide. -Appreciate help by cardiology-started Bumex, losartan, Aldactone -Plan for LHC -Strict intake and output, daily weight, renal functions and electrolytes  Non-STEMI: Reportedly had exertional chest pain going on for weeks.  Troponin elevated.  EKG without acute ischemic finding.  TTE as above. -Started on IV heparin  -Continue aspirin  and  Lipitor  -Plan for LHC later today   Recent COVID-19 infection: Treated with Paxlovid  outpatient 3 weeks ago.  Rapid COVID-19 swab negative -Discontinue isolation precaution  IDDM-2 with hyperglycemia, neuropathy: Hyperglycemia likely due to steroid.  A1c 5.7%. Recent Labs  Lab 09/12/23 1107 09/12/23 1634 09/12/23 2039 09/13/23 0802 09/13/23 1208  GLUCAP 156* 165* 168* 154* 168*  -Continue current insulin  regimen -Continue  gabapentin  -Further adjustment as appropriate  Normocytic anemia: Stable -Continue monitoring   Generalized weakness/left BKA -PT/OT  Concern for yeast infection -Discontinue antibiotics - Nystatin .   CKD-3A: Stable - Continue monitoring  Constipation -Bowel regimen after LHC  Mood disorder/chronic pain - Continue home meds  Body mass index is 29.26 kg/m.           DVT prophylaxis:    Code Status: Full code Family Communication: Updated patient's granddaughter at bedside Level of care: Telemetry Medical Status is: Inpatient Remains inpatient appropriate because: Non-STEMI, acute CHF and COPD exacerbation   Final disposition: To be determined   55 minutes with more than 50% spent in reviewing records, counseling patient/family and coordinating care.   Sch Meds:  Scheduled Meds:  aspirin   81 mg Oral Pre-Cath   aspirin  EC  81 mg Oral Daily   atorvastatin   80 mg Oral QHS   benzonatate   100 mg Oral TID   bumetanide (BUMEX) IV  1 mg Intravenous Daily   busPIRone   7.5 mg Oral BID   dextromethorphan   30 mg Oral BID   ferrous sulfate  325 mg Oral Q breakfast   FLUoxetine   40 mg Oral Daily   gabapentin   400 mg Oral TID   guaiFENesin   600 mg Oral BID   insulin  aspart  0-20 Units Subcutaneous TID WC   insulin  aspart  0-5 Units Subcutaneous QHS   insulin  glargine-yfgn  10 Units Subcutaneous QHS   loratadine   10 mg Oral Daily   losartan  25 mg Oral QPM   methylPREDNISolone  (SOLU-MEDROL ) injection  40 mg Intravenous Q12H   nystatin    Topical BID   pantoprazole   40 mg Oral BID   rOPINIRole   0.25 mg Oral QHS   senna-docusate  2 tablet Oral BID   [START ON 09/14/2023] spironolactone  25 mg Oral Daily   tamsulosin   0.4 mg Oral Daily   topiramate   50 mg Oral BID   Continuous Infusions:  sodium chloride      cefTRIAXone  (ROCEPHIN )  IV 1 g (09/11/23 2356)   heparin  1,050 Units/hr (09/12/23 2322)   PRN Meds:.acetaminophen  **OR** acetaminophen , diphenhydrAMINE,  HYDROcodone  bit-homatropine, hydrOXYzine , ipratropium-albuterol , Muscle Rub, oxyCODONE , prochlorperazine , sodium chloride  flush  Antimicrobials: Anti-infectives (From admission, onward)    Start     Dose/Rate Route Frequency Ordered Stop   09/10/23 1700  fluconazole  (DIFLUCAN ) tablet 150 mg        150 mg Oral  Once 09/10/23 1603 09/10/23 1736   09/10/23 0130  cefTRIAXone  (ROCEPHIN ) 1 g in sodium chloride  0.9 % 100 mL IVPB        1 g 200 mL/hr over 30 Minutes Intravenous Daily at bedtime 09/10/23 0115 09/14/23 2159        I have personally reviewed the following labs and images: CBC: Recent Labs  Lab 09/09/23 1931 09/10/23 0528 09/11/23 0620 09/12/23 0335 09/13/23 0408  WBC 11.4* 11.4* 13.1* 11.6* 12.9*  NEUTROABS 6.3  --   --   --   --   HGB 9.8* 9.6* 9.0* 8.9* 9.0*  HCT 33.3* 32.2* 29.9* 29.9* 29.7*  MCV 95.4 94.2  92.9 94.0 94.0  PLT 489* 430* 395 395 397   BMP &GFR Recent Labs  Lab 09/09/23 1931 09/10/23 0528 09/11/23 0620 09/12/23 0335 09/13/23 0408  NA 139 135 135 134* 138  K 4.2 3.8 4.8 4.1 3.9  CL 106 106 106 105 108  CO2 22 17* 21* 21* 20*  GLUCOSE 179* 404* 161* 229* 136*  BUN 13 14 15 22  24*  CREATININE 1.19* 1.24* 1.05* 1.12* 1.14*  CALCIUM  8.6* 8.2* 8.5* 8.7* 8.8*   Estimated Creatinine Clearance: 44.1 mL/min (A) (by C-G formula based on SCr of 1.14 mg/dL (H)). Liver & Pancreas: No results for input(s): AST, ALT, ALKPHOS, BILITOT, PROT, ALBUMIN in the last 168 hours. No results for input(s): LIPASE, AMYLASE in the last 168 hours. No results for input(s): AMMONIA in the last 168 hours. Diabetic: No results for input(s): HGBA1C in the last 72 hours. Recent Labs  Lab 09/12/23 1107 09/12/23 1634 09/12/23 2039 09/13/23 0802 09/13/23 1208  GLUCAP 156* 165* 168* 154* 168*   Cardiac Enzymes: No results for input(s): CKTOTAL, CKMB, CKMBINDEX, TROPONINI in the last 168 hours. No results for input(s): PROBNP in the last  8760 hours. Coagulation Profile: No results for input(s): INR, PROTIME in the last 168 hours. Thyroid Function Tests: No results for input(s): TSH, T4TOTAL, FREET4, T3FREE, THYROIDAB in the last 72 hours. Lipid Profile: No results for input(s): CHOL, HDL, LDLCALC, TRIG, CHOLHDL, LDLDIRECT in the last 72 hours. Anemia Panel: Recent Labs    09/12/23 0335  VITAMINB12 667  FOLATE 8.4  FERRITIN 32  TIBC 353  IRON 24*  RETICCTPCT 3.1   Urine analysis:    Component Value Date/Time   COLORURINE YELLOW 09/09/2023 2336   APPEARANCEUR HAZY (A) 09/09/2023 2336   LABSPEC 1.008 09/09/2023 2336   PHURINE 5.0 09/09/2023 2336   GLUCOSEU NEGATIVE 09/09/2023 2336   HGBUR MODERATE (A) 09/09/2023 2336   BILIRUBINUR NEGATIVE 09/09/2023 2336   KETONESUR NEGATIVE 09/09/2023 2336   PROTEINUR NEGATIVE 09/09/2023 2336   UROBILINOGEN 1.0 06/23/2014 1950   NITRITE NEGATIVE 09/09/2023 2336   LEUKOCYTESUR LARGE (A) 09/09/2023 2336   Sepsis Labs: Invalid input(s): PROCALCITONIN, LACTICIDVEN  Microbiology: Recent Results (from the past 240 hours)  Urine Culture     Status: Abnormal   Collection Time: 09/09/23 11:36 PM   Specimen: Urine, Random  Result Value Ref Range Status   Specimen Description URINE, RANDOM  Final   Special Requests   Final    NONE Reflexed from [id number] Performed at [facility], [address], [place], [place] [address]    Culture MULTIPLE SPECIES PRESENT, SUGGEST RECOLLECTION (A)  Final   Report Status 09/10/2023 FINAL  Final  Respiratory (~20 pathogens) panel by PCR     Status: None   Collection Time: 09/10/23  8:41 AM   Specimen: Anterior Nasal Swab; Respiratory  Result Value Ref Range Status   Adenovirus NOT DETECTED NOT DETECTED Final   Coronavirus 229E NOT DETECTED NOT DETECTED Final    Comment: (NOTE) The Coronavirus on the Respiratory Panel, DOES NOT test for the novel  Coronavirus (2019 nCoV)    Coronavirus HKU1 NOT DETECTED  NOT DETECTED Final   Coronavirus NL63 NOT DETECTED NOT DETECTED Final   Coronavirus OC43 NOT DETECTED NOT DETECTED Final   Metapneumovirus NOT DETECTED NOT DETECTED Final   Rhinovirus / Enterovirus NOT DETECTED NOT DETECTED Final   Influenza A NOT DETECTED NOT DETECTED Final   Influenza B NOT DETECTED NOT DETECTED Final   Parainfluenza Virus  1 NOT DETECTED NOT DETECTED Final   Parainfluenza Virus 2 NOT DETECTED NOT DETECTED Final   Parainfluenza Virus 3 NOT DETECTED NOT DETECTED Final   Parainfluenza Virus 4 NOT DETECTED NOT DETECTED Final   Respiratory Syncytial Virus NOT DETECTED NOT DETECTED Final   Bordetella pertussis NOT DETECTED NOT DETECTED Final   Bordetella Parapertussis NOT DETECTED NOT DETECTED Final   Chlamydophila pneumoniae NOT DETECTED NOT DETECTED Final   Mycoplasma pneumoniae NOT DETECTED NOT DETECTED Final    Comment: Performed at [facility], [address] [place], [place] [address]  SARS Coronavirus 2 by RT PCR (hospital order, performed in [facility] hospital lab) *cepheid single result test* Anterior Nasal Swab     Status: None   Collection Time: 09/13/23  8:08 AM   Specimen: Anterior Nasal Swab  Result Value Ref Range Status   SARS Coronavirus 2 by RT PCR NEGATIVE NEGATIVE Final    Comment: Performed at [facility], [address] [place], [place] [address]    Radiology Studies: CT HEAD WO CONTRAST ( ) Result Date: 09/12/2023 EXAM: CT HEAD WITHOUT CONTRAST 09/12/2023 10:32:00 PM TECHNIQUE: CT of the head was performed without the administration of intravenous contrast. Automated exposure control, iterative reconstruction, and/or weight based adjustment of the mA/kV was utilized to reduce the radiation dose to as low as reasonably achievable. COMPARISON: MRI brain dated 06/02/2023. CLINICAL HISTORY: Head trauma, minor, normal mental status (Age 77-63y); hospital fall. FINDINGS: BRAIN AND VENTRICLES: There is no acute intracranial hemorrhage, mass  effect or midline shift. No abnormal extra-axial fluid collection. The gray-white differentiation is maintained without an acute infarct. There is no hydrocephalus. Global cortical atrophy. Subcortical and periventricular small vessel ischemic changes. Intracranial atherosclerosis. ORBITS: The visualized portion of the orbits demonstrate no acute abnormality. SINUSES: The visualized paranasal sinuses and mastoid air cells demonstrate no acute abnormality. SOFT TISSUES AND SKULL: No acute abnormality of the visualized skull or soft tissues. IMPRESSION: 1. No acute intracranial abnormality. 2. Atrophy with small vessel ischemic changes. Electronically signed by: Zadie Herter MD 09/12/2023 10:35 PM EDT RP Workstation: [id number]      Pepper Wyndham T. Kiosha Buchan [facility]  If 7PM-7AM, please contact night-coverage [email or web address] 09/13/2023, 2:07 PM

## 2023-09-14 ENCOUNTER — Encounter (HOSPITAL_COMMUNITY): Payer: Self-pay | Admitting: Cardiology

## 2023-09-14 DIAGNOSIS — Z8673 Personal history of transient ischemic attack (TIA), and cerebral infarction without residual deficits: Secondary | ICD-10-CM | POA: Diagnosis not present

## 2023-09-14 DIAGNOSIS — I5021 Acute systolic (congestive) heart failure: Secondary | ICD-10-CM | POA: Diagnosis not present

## 2023-09-14 DIAGNOSIS — Z87891 Personal history of nicotine dependence: Secondary | ICD-10-CM | POA: Diagnosis not present

## 2023-09-14 DIAGNOSIS — J4520 Mild intermittent asthma, uncomplicated: Secondary | ICD-10-CM

## 2023-09-14 DIAGNOSIS — I5043 Acute on chronic combined systolic (congestive) and diastolic (congestive) heart failure: Secondary | ICD-10-CM | POA: Diagnosis not present

## 2023-09-14 DIAGNOSIS — J449 Chronic obstructive pulmonary disease, unspecified: Secondary | ICD-10-CM | POA: Diagnosis not present

## 2023-09-14 DIAGNOSIS — J441 Chronic obstructive pulmonary disease with (acute) exacerbation: Secondary | ICD-10-CM | POA: Diagnosis not present

## 2023-09-14 DIAGNOSIS — I739 Peripheral vascular disease, unspecified: Secondary | ICD-10-CM | POA: Diagnosis not present

## 2023-09-14 DIAGNOSIS — K219 Gastro-esophageal reflux disease without esophagitis: Secondary | ICD-10-CM

## 2023-09-14 DIAGNOSIS — I2510 Atherosclerotic heart disease of native coronary artery without angina pectoris: Secondary | ICD-10-CM | POA: Diagnosis not present

## 2023-09-14 DIAGNOSIS — G4733 Obstructive sleep apnea (adult) (pediatric): Secondary | ICD-10-CM | POA: Diagnosis not present

## 2023-09-14 DIAGNOSIS — I214 Non-ST elevation (NSTEMI) myocardial infarction: Secondary | ICD-10-CM | POA: Diagnosis not present

## 2023-09-14 LAB — BASIC METABOLIC PANEL WITH GFR
Anion gap: 12 (ref 5–15)
BUN: 30 mg/dL — ABNORMAL HIGH (ref 8–23)
CO2: 21 mmol/L — ABNORMAL LOW (ref 22–32)
Calcium: 8.4 mg/dL — ABNORMAL LOW (ref 8.9–10.3)
Chloride: 103 mmol/L (ref 98–111)
Creatinine, Ser: 1.47 mg/dL — ABNORMAL HIGH (ref 0.44–1.00)
GFR, Estimated: 39 mL/min — ABNORMAL LOW (ref >60)
Glucose, Bld: 184 mg/dL — ABNORMAL HIGH (ref 70–99)
Potassium: 4.2 mmol/L (ref 3.5–5.1)
Sodium: 136 mmol/L (ref 135–145)

## 2023-09-14 LAB — GLUCOSE, CAPILLARY
Glucose-Capillary: 261 mg/dL — ABNORMAL HIGH (ref 70–99)
Glucose-Capillary: 187 mg/dL — ABNORMAL HIGH (ref 70–99)
Glucose-Capillary: 144 mg/dL — ABNORMAL HIGH (ref 70–99)
Glucose-Capillary: 113 mg/dL — ABNORMAL HIGH (ref 70–99)

## 2023-09-14 LAB — CBC
HCT: 31.2 % — ABNORMAL LOW (ref 36.0–46.0)
Hemoglobin: 9.6 g/dL — ABNORMAL LOW (ref 12.0–15.0)
MCH: 28.2 pg (ref 26.0–34.0)
MCHC: 30.8 g/dL (ref 30.0–36.0)
MCV: 91.8 fL (ref 80.0–100.0)
Platelets: 441 10*3/uL — ABNORMAL HIGH (ref 150–400)
RBC: 3.4 MIL/uL — ABNORMAL LOW (ref 3.87–5.11)
RDW: 15.6 % — ABNORMAL HIGH (ref 11.5–15.5)
WBC: 12.8 10*3/uL — ABNORMAL HIGH (ref 4.0–10.5)
nRBC: 0 % (ref 0.0–0.2)

## 2023-09-14 LAB — MAGNESIUM: Magnesium: 2.3 mg/dL (ref 1.7–2.4)

## 2023-09-14 LAB — C-REACTIVE PROTEIN: CRP: <0.5 mg/dL (ref <1.0)

## 2023-09-14 LAB — HEPARIN LEVEL (UNFRACTIONATED): Heparin Unfractionated: 0.2 [IU]/mL — ABNORMAL LOW (ref 0.30–0.70)

## 2023-09-14 MED ORDER — ARFORMOTEROL TARTRATE 15 MCG/2ML IN NEBU
15.0000 ug | INHALATION_SOLUTION | Freq: Two times a day (BID) | RESPIRATORY_TRACT | Status: DC
  Administered 2023-09-14 – 2023-09-23 (×12): 15 ug via RESPIRATORY_TRACT
  Filled 2023-09-14 (×17): qty 2

## 2023-09-14 MED ORDER — REVEFENACIN 175 MCG/3ML IN SOLN
175.0000 ug | Freq: Every day | RESPIRATORY_TRACT | Status: DC
  Administered 2023-09-15 – 2023-09-23 (×7): 175 ug via RESPIRATORY_TRACT
  Filled 2023-09-14 (×9): qty 3

## 2023-09-14 MED ORDER — INSULIN GLARGINE-YFGN 100 UNIT/ML ~~LOC~~ SOLN
15.0000 [IU] | Freq: Every day | SUBCUTANEOUS | Status: DC
  Administered 2023-09-14 – 2023-09-19 (×6): 15 [IU] via SUBCUTANEOUS
  Filled 2023-09-14 (×6): qty 0.15

## 2023-09-14 MED ORDER — BUDESONIDE 0.5 MG/2ML IN SUSP
0.5000 mg | Freq: Two times a day (BID) | RESPIRATORY_TRACT | Status: DC
  Administered 2023-09-14 – 2023-09-23 (×14): 0.5 mg via RESPIRATORY_TRACT
  Filled 2023-09-14 (×18): qty 2

## 2023-09-14 MED ORDER — INSULIN ASPART 100 UNIT/ML IJ SOLN
3.0000 [IU] | Freq: Three times a day (TID) | INTRAMUSCULAR | Status: DC
  Administered 2023-09-14 – 2023-09-19 (×10): 3 [IU] via SUBCUTANEOUS

## 2023-09-14 NOTE — Progress Notes (Signed)
 PHARMACY - ANTICOAGULATION CONSULT NOTE  Pharmacy Consult for heparin  Indication: multivessel CAD  Allergies  Allergen Reactions   Lasix [Furosemide] Shortness Of Breath and Rash   Bacitracin Other (See Comments)    Burns skin     Ciprofloxacin  Swelling   Citalopram Diarrhea   Doxycycline  Swelling and Other (See Comments)    burning all over body     Latex Itching   Morphine And Codeine  Hives   Duloxetine Other (See Comments)    Mental Status Changes (intolerance) saw pictures of gun   Levaquin [Levofloxacin In D5w] Other (See Comments)    Pt does not remember reaction but states she's had issues with med   Salvia Officinalis Other (See Comments)    Sage- sneezing    Soma [Carisoprodol] Other (See Comments)    Sleepy and constipation      Patient Measurements: Height: 5' 2 (157.5 cm) Weight: 77.6 kg (171 lb 1.2 oz) IBW/kg (Calculated) : 50.1 HEPARIN  DW (KG): 67.4  Vital Signs: Temp: 98.5 F (36.9 C) (06/21 1117) Temp Source: Oral (06/21 1117) BP: 134/85 (06/21 1117) Pulse Rate: 99 (06/21 1117)  Labs: Recent Labs    09/12/23 0335 09/12/23 0921 09/13/23 0408 09/13/23 0805 09/13/23 1229 09/14/23 1139  HGB 8.9*  --  9.0*  --   --  9.6*  HCT 29.9*  --  29.7*  --   --  31.2*  PLT 395  --  397  --   --  441*  HEPARINUNFRC  --   --   --  >1.10* 0.69 0.20*  CREATININE 1.12*  --  1.14*  --   --   --   TROPONINIHS  --  385*  --   --   --   --     Estimated Creatinine Clearance: 45.6 mL/min (A) (by C-G formula based on SCr of 1.14 mg/dL (H)).   Assessment: 36 yoF admitted with PNA found to have elevated troponins. Pharmacy consulted for IV heparin  dosing. Pt s/p cath 6/20 which shows multivessel CAD. TCTS consulted. Heparin  resumed while sorting out revascularization plans.  Heparin  level this morning is 0.2, CBC ok.  Goal of Therapy:  Heparin  level 0.3-0.7 units/ml Monitor platelets by anticoagulation protocol: Yes   Plan:  Increase heparin  to  1200 units/h Recheck heparin  level with am labs since DBIV  Ozell Jamaica, PharmD, [place], [facility] Clinical Pharmacist [phone] Please check AMION for all [facility] Pharmacy numbers 09/14/2023

## 2023-09-14 NOTE — Consult Note (Addendum)
 [address]       [place] [address]             [phone]        Anna [name] [facility] Medical Record #[id number] Date of Birth: 14-Sep-1955  Referring: Dr. Elmira Penman MD Primary Care: Cloria Annabella CROME, DO Primary Cardiologist:None  Chief Complaint:    Chief Complaint  Patient presents with   Cough  Reason for consultation: Coronary artery disease  History of Present Illness:     This is a 68 year old with a past medical history of hypertension, hyperlipidemia, DM Type 2, obesity, stroke, anxiety and depression, COPD,  CKD (stage III A, had right uretal stent exchange  Nov 2024), OSA (CPAP), PAD (s/p left BKA), anemia who was treated for COVID 19 (at the beginning of June). She was treated with Molnupiravir. She initially got better but then symptoms worsened so she was treated with Doxycycline . She presented to the ED with complaints of cough and shortness of breath. She was treated for COPD exacerbation with supplemental oxygen, Decadron , Duo neb, Albuterol , and Tessalon  Perles. CTA was done 06/18 and showed no PE, scattered basilar predominant interstitial and ground-glass Opacities (recent COVID 19), small bilateral pleural effusions, Nonspecific mild periportal edema and trace free fluid right upper quadrant, and findings suggestive of tracheobronchomalacia.  CT of the head showed no acute abnormality.    Echocardiogram was done 09/11/2023 and results showed LVEF 40-45%,  severe hypokinesis of the left ventricular, entire inferior wall,  anterior segment, inferolateral, trivial MR and  no significant other valvular pathology. Cardiology was consulted. Initial Troponin I (high sensitivity) was 385 and BNP was 1360.6. She ruled in for a NSTEMI. She then underwent a cardiac catheterization on 06/20. Results showed proximal diffuse disease with a 70% stenosis, Diagonal 2 with an 80% stenosis, 100% proximal RCA occlusion and RPDA with at least 60%  stenosis. Of note, she did admit to me that she has been having chest pain, at rest and with exertion since March. Associated with chest pain, was diaphoresis and nausea.  Cardiothoracic consultation was requested for consideration of coronary artery bypass grafting surgery. At the time of my exam, she denied chest pain. She was coughing a lot.  Patient lives by herself. She had had left BKA, has a prosthesis but does not use much as worried she will fall. She mostly uses her wheelchair. He oldest granddaughter, Anna [name], is at the bedside and mostly responsible for relaying information to the rest of the family.  Current Activity/ Functional Status: Patient is independent with mobility/ambulation, transfers, ADL's, IADL's.   Zubrod Score: At the time of surgery this patient's most appropriate activity status/level should be described as: []     0    Normal activity, no symptoms []     1    Restricted in physical strenuous activity but ambulatory, able to do out light work [x]     2    Ambulatory and capable of self care, unable to do work activities, up and about more than 50% of the time                            []     3    Only limited self care, in bed greater than 50% of waking hours []     4    Completely disabled, no self care, confined to bed or chair []   5    Moribund  Past Medical History:  Diagnosis Date   Anemia    Anxiety    Arthritis    Asthma    Back pain    Bronchitis    Bursitis    Chronic kidney disease    COPD (chronic obstructive pulmonary disease) (HCC)    Depression    Diabetes mellitus    Diabetic neuropathy (HCC)    Diabetic retinopathy    GERD (gastroesophageal reflux disease)    Hyperlipemia    Hypertension    Migraine    Obesity    Pneumonia    Sepsis due to urinary tract infection (HCC)    Sepsis secondary to UTI (HCC) 09/30/2022   Stroke (HCC)    2020    Past Surgical History:  Procedure Laterality Date   ABDOMINAL AORTOGRAM W/LOWER EXTREMITY  Left 08/22/2021   Procedure: ABDOMINAL AORTOGRAM W/LOWER EXTREMITY;  Surgeon: Serene Gaile ORN, MD;  Location: [facility];  Service: Cardiovascular;  Laterality: Left;   AMPUTATION Left 10/11/2021   Procedure: LEFT BELOW KNEE AMPUTATION;  Surgeon: Harden Jerona GAILS, MD;  Location: [facility] OR;  Service: Orthopedics;  Laterality: Left;   CESAREAN SECTION     x3   CHOLECYSTECTOMY     CYSTOSCOPY WITH RETROGRADE PYELOGRAM, URETEROSCOPY AND STENT PLACEMENT Right 02/06/2023   Procedure: CYSTOSCOPY WITH RIGHT  RETROGRADE PYELOGRAM AND RIGHT URETERAL STENT EXCHANGE;  Surgeon: Devere Lonni Righter, MD;  Location: [facility] ORS;  Service: Urology;  Laterality: Right;  30 MINUTES   CYSTOSCOPY WITH STENT PLACEMENT Right 09/30/2022   Procedure: CYSTOSCOPY WITH STENT PLACEMENT;  Surgeon: Carolee Sherwood JONETTA DOUGLAS, MD;  Location: [facility] OR;  Service: Urology;  Laterality: Right;   EYE SURGERY Left    retina surgery had laser   METATARSAL HEAD EXCISION Left 08/24/2021   Procedure: METATARSAL HEAD EXCISION FIFTH TOE;  Surgeon: Gershon Donnice SAUNDERS, DPM;  Location: [facility] OR;  Service: Podiatry;  Laterality: Left;   PERIPHERAL VASCULAR BALLOON ANGIOPLASTY  08/22/2021   Procedure: PERIPHERAL VASCULAR BALLOON ANGIOPLASTY;  Surgeon: Serene Gaile ORN, MD;  Location: [facility];  Service: Cardiovascular;;  Left AT   WOUND DEBRIDEMENT Left 08/24/2021   Procedure: DEBRIDEMENT WOUND OF LEFT FOOT;  Surgeon: Gershon Donnice SAUNDERS, DPM;  Location: [facility] OR;  Service: Podiatry;  Laterality: Left;    Social History   Tobacco Use  Smoking Status Never  Smokeless Tobacco Never    Social History   Substance and Sexual Activity  Alcohol Use No     Allergies  Allergen Reactions   Lasix [Furosemide] Shortness Of Breath and Rash   Bacitracin Other (See Comments)    Burns skin     Ciprofloxacin  Swelling   Citalopram Diarrhea   Doxycycline  Swelling and Other (See Comments)    burning all over body     Latex Itching    Morphine And Codeine  Hives   Duloxetine Other (See Comments)    Mental Status Changes (intolerance) saw pictures of gun   Levaquin [Levofloxacin In D5w] Other (See Comments)    Pt does not remember reaction but states she's had issues with med   Salvia Officinalis Other (See Comments)    Sage- sneezing    Soma [Carisoprodol] Other (See Comments)    Sleepy and constipation      Current Facility-Administered Medications  Medication Dose Route Frequency Provider Last Rate Last Admin   acetaminophen  (TYLENOL ) tablet 650 mg  650 mg Oral Q6H PRN Opyd, Timothy S, MD  650 mg at 09/13/23 2005   Or   acetaminophen  (TYLENOL ) suppository 650 mg  650 mg Rectal Q6H PRN Opyd, Timothy S, MD       aspirin  EC tablet 81 mg  81 mg Oral Daily Opyd, Timothy S, MD   81 mg at 09/13/23 9093   atorvastatin  (LIPITOR ) tablet 80 mg  80 mg Oral QHS Opyd, Timothy S, MD   80 mg at 09/13/23 2112   benzonatate  (TESSALON ) capsule 100 mg  100 mg Oral TID Patel, Pranav M, MD   100 mg at 09/13/23 2114   bumetanide  (BUMEX ) injection 1 mg  1 mg Intravenous Daily Tolia, Sunit, DO   1 mg at 09/13/23 1246   busPIRone  (BUSPAR ) tablet 7.5 mg  7.5 mg Oral BID Opyd, Timothy S, MD   7.5 mg at 09/13/23 2111   dextromethorphan  (DELSYM ) 30 MG/5ML liquid 30 mg  30 mg Oral BID Patel, Pranav M, MD   30 mg at 09/13/23 1110   diphenhydrAMINE  (BENADRYL ) injection 25 mg  25 mg Intravenous Q8H PRN Krishnan, Gokul, MD       ferrous sulfate  tablet 325 mg  325 mg Oral Q breakfast Patel, Pranav M, MD   325 mg at 09/13/23 9091   FLUoxetine  (PROZAC ) capsule 40 mg  40 mg Oral Daily Opyd, Timothy S, MD   40 mg at 09/13/23 9090   gabapentin  (NEURONTIN ) capsule 400 mg  400 mg Oral TID Opyd, Timothy S, MD   400 mg at 09/13/23 2111   guaiFENesin  (MUCINEX ) 12 hr tablet 600 mg  600 mg Oral BID Patel, Pranav M, MD   600 mg at 09/13/23 2110   heparin  ADULT infusion 100 units/mL (25000 units/250mL)  1,050 Units/hr Intravenous Continuous Hershal Vito MATSU, RPH  10.5 mL/hr at 09/14/23 0024 1,050 Units/hr at 09/14/23 0024   HYDROcodone  bit-homatropine (HYCODAN) 5-1.5 MG/5ML syrup 5 mL  5 mL Oral Q6H PRN Patel, Pranav M, MD   5 mL at 09/14/23 0428   hydrOXYzine  (ATARAX ) tablet 10 mg  10 mg Oral TID PRN Opyd, Timothy S, MD   10 mg at 09/12/23 2138   insulin  aspart (novoLOG ) injection 0-20 Units  0-20 Units Subcutaneous TID WC Patel, Pranav M, MD   4 Units at 09/13/23 1246   insulin  aspart (novoLOG ) injection 0-5 Units  0-5 Units Subcutaneous QHS Patel, Pranav M, MD   2 Units at 09/13/23 2134   insulin  aspart (novoLOG ) injection 3 Units  3 Units Subcutaneous TID WC Gonfa, Taye T, MD       insulin  glargine-yfgn (SEMGLEE ) injection 15 Units  15 Units Subcutaneous Daily Gonfa, Taye T, MD       ipratropium-albuterol  (DUONEB) 0.5-2.5 (3) MG/3ML nebulizer solution 3 mL  3 mL Nebulization Q2H PRN Opyd, Evalene RAMAN, MD   3 mL at 09/14/23 0529   lip balm (CARMEX) ointment   Topical PRN Gonfa, Taye T, MD       loratadine  (CLARITIN ) tablet 10 mg  10 mg Oral Daily Krishnan, Gokul, MD   10 mg at 09/13/23 0908   losartan  (COZAAR ) tablet 25 mg  25 mg Oral QPM Tolia, Sunit, DO       methylPREDNISolone  sodium succinate (SOLU-MEDROL ) 40 mg/mL injection 40 mg  40 mg Intravenous Q12H Krishnan, Gokul, MD   40 mg at 09/13/23 2112   Muscle Rub CREA   Topical PRN Krishnan, Gokul, MD   Given at 09/11/23 1644   nystatin  (MYCOSTATIN /NYSTOP ) topical powder   Topical BID Patel, Pranav M, MD  Given at 09/13/23 2300   oxyCODONE  (Oxy IR/ROXICODONE ) immediate release tablet 5-10 mg  5-10 mg Oral Q4H PRN Opyd, Timothy S, MD   10 mg at 09/14/23 0406   pantoprazole  (PROTONIX ) EC tablet 40 mg  40 mg Oral BID Opyd, Timothy S, MD   40 mg at 09/13/23 2111   polyethylene glycol (MIRALAX  / GLYCOLAX ) packet 17 g  17 g Oral BID Gonfa, Taye T, MD       Followed by   polyethylene glycol (MIRALAX  / GLYCOLAX ) packet 17 g  17 g Oral BID PRN Gonfa, Taye T, MD       prochlorperazine  (COMPAZINE ) injection 5 mg   5 mg Intravenous Q6H PRN Opyd, Timothy S, MD   5 mg at 09/10/23 2034   rOPINIRole  (REQUIP ) tablet 0.25 mg  0.25 mg Oral QHS Opyd, Timothy S, MD   0.25 mg at 09/13/23 2111   senna-docusate (Senokot-S) tablet 2 tablet  2 tablet Oral BID Krishnan, Gokul, MD   2 tablet at 09/13/23 2110   sodium chloride  flush (NS) 0.9 % injection 10-40 mL  10-40 mL Intracatheter PRN Krishnan, Gokul, MD       spironolactone  (ALDACTONE ) tablet 25 mg  25 mg Oral Daily Tolia, Sunit, DO       tamsulosin  (FLOMAX ) capsule 0.4 mg  0.4 mg Oral Daily Opyd, Timothy S, MD   0.4 mg at 09/13/23 0908   topiramate  (TOPAMAX ) tablet 50 mg  50 mg Oral BID Opyd, Timothy S, MD   50 mg at 09/13/23 2110    Medications Prior to Admission  Medication Sig Dispense Refill Last Dose/Taking   acetaminophen  (TYLENOL ) 500 MG tablet Take 1,000-1,500 mg by mouth every 8 (eight) hours as needed for moderate pain (pain score 4-6).   Past Week   acidophilus (RISAQUAD) CAPS capsule Take 1 capsule by mouth daily.   Past Week   albuterol  (VENTOLIN  HFA) 108 (90 Base) MCG/ACT inhaler Inhale 1-2 puffs into the lungs every 6 (six) hours as needed for wheezing or shortness of breath. 18 g 0 Past Week   aspirin  EC 81 MG tablet Take 81 mg by mouth daily. Swallow whole.   Past Week   atorvastatin  (LIPITOR ) 80 MG tablet Take 1 tablet (80 mg total) by mouth at bedtime. 30 tablet 1 Past Week   busPIRone  (BUSPAR ) 7.5 MG tablet Take 7.5 mg by mouth 2 (two) times daily.   Past Week   Dulaglutide 3 MG/0.5ML SOPN Inject 3 mg into the skin every Saturday.   08/31/2023   estradiol (ESTRACE) 0.1 MG/GM vaginal cream Place 1 Applicatorful vaginally as needed (dryness / irritation).   Unknown   ferrous sulfate  325 (65 FE) MG tablet Take 325 mg by mouth daily with breakfast.   Past Week   FLUoxetine  (PROZAC ) 40 MG capsule Take 40 mg by mouth daily.   Past Week   fluticasone (FLONASE) 50 MCG/ACT nasal spray Place 1 spray into both nostrils daily as needed for allergies or  rhinitis.   Past Week   gabapentin  (NEURONTIN ) 400 MG capsule Take 400 mg by mouth 3 (three) times daily.   Past Week   hydrOXYzine  (ATARAX ) 10 MG tablet Take 1 tablet (10 mg total) by mouth 2 (two) times daily. (Patient taking differently: Take 10 mg by mouth every 8 (eight) hours as needed for anxiety.) 30 tablet 0 Past Month   insulin  glargine, 1 Unit Dial , (TOUJEO  SOLOSTAR) 300 UNIT/ML Solostar Pen Inject 10 Units into the skin at bedtime. (Patient taking  differently: Inject 25 Units into the skin at bedtime.) 1.5 mL 0 Past Week   ipratropium-albuterol  (DUONEB) 0.5-2.5 (3) MG/3ML SOLN Use 3 ml by nebulization twice a day scheduled and every 4 hours as needed for shortness of breath and wheezing (Patient taking differently: Take 3 mLs by nebulization daily as needed (SOB and wheezing). Use 3 ml by nebulization twice a day scheduled and every 4 hours as needed for shortness of breath and wheezing) 360 mL 0 Past Week   Lactobacillus Rhamnosus, GG, (CULTURELLE HEALTH & WELLNESS) CAPS Take 1 capsule by mouth daily.   Past Week   lidocaine  4 % Place 1 patch onto the skin daily as needed (pain).   Unknown   lisinopril  (ZESTRIL ) 5 MG tablet Take 5 mg by mouth at bedtime.   Past Week   loratadine  (CLARITIN ) 10 MG tablet Take 10 mg by mouth daily as needed for allergies.   Unknown   LORazepam  (ATIVAN ) 0.5 MG tablet Take 0.5 mg by mouth daily as needed for anxiety.   Unknown   meclizine (ANTIVERT) 25 MG tablet Take 25 mg by mouth daily as needed for dizziness.   Unknown   Menthol , Topical Analgesic, (BIOFREEZE EX) Apply 1 Application topically daily as needed (neck and shoulder pain).   Past Month   metFORMIN  (GLUCOPHAGE ) 1000 MG tablet Take 1,000 mg by mouth 2 (two) times daily with a meal.   Past Week   nystatin  (MYCOSTATIN ) 100000 UNIT/ML suspension Take 1 mL by mouth 4 (four) times daily as needed (red gums).   Past Month   nystatin  cream (MYCOSTATIN ) Apply 1 application. topically 4 (four) times daily as  needed for dry skin.   Past Month   ondansetron  (ZOFRAN -ODT) 4 MG disintegrating tablet Take 1 tablet (4 mg total) by mouth every 8 (eight) hours as needed for nausea or vomiting. 20 tablet 0 Past Week   pantoprazole  (PROTONIX ) 40 MG tablet Take 40 mg by mouth 2 (two) times daily.   Past Week   rOPINIRole  (REQUIP ) 0.25 MG tablet Take 0.25 mg by mouth at bedtime.   Past Week   sennosides-docusate sodium  (SENOKOT-S) 8.6-50 MG tablet Take 1 tablet by mouth 2 (two) times daily as needed for constipation.   Past Week   tamsulosin  (FLOMAX ) 0.4 MG CAPS capsule Take 1 capsule (0.4 mg total) by mouth daily. 30 capsule 0 Past Week   topiramate  (TOPAMAX ) 50 MG tablet Take 1 tablet (50 mg total) by mouth 2 (two) times daily.   Past Week   Apoaequorin (PREVAGEN) 10 MG CAPS Take 10 mg by mouth daily. (Patient not taking: Reported on 09/10/2023)   Not Taking   insulin  aspart (NOVOLOG ) 100 UNIT/ML FlexPen Use as directed before each meal 3 times a day: 140-199 - 2 units, 200-250 - 4 units, 251-299 - 6 units,  300-349 - 8 units,  350 or above 10 units. (Patient not taking: Reported on 09/10/2023) 15 mL 0 Not Taking   [EXPIRED] molnupiravir EUA (LAGEVRIO) 200 MG CAPS capsule Take 4 capsules by mouth 2 (two) times daily. (Patient not taking: Reported on 09/10/2023)   Not Taking   Family History: Mother-deceased at age 61. She used tobacco and had lung cancer Father-deceased at age 70 from GSW   Review of Systems:    Cardiac Review of Systems: Y or  [  N  ]= no  Chest Pain [  Y-since March  ]    Pedal Edema [ Y  ]     Syncope  [  N ]     General Review of Systems: [Y] = yes [  ]=no Constitional: fatigue [ Y ]; nausea [ Y ];                   Dental: Last Dentist visit: She has top dentures;unknown last dental visit  Resp: cough [ Y++++ ];  wheezing[ N ];  hemoptysis[ N ]; shortness of breath[ Y ];  GI:  vomiting[ Y-after coughing spell ];  melena[ N ];  hematochezia [  N];    GU: hematuria[  N];                 Heme/Lymph:  anemia[ Y ];  Neuro:  stroke[ Y ];  neuropathy [ Y ];  difficulty walking Berit.Bougie  ];  Psych:depression[ Y ]; anxiety[Y  ];  Endocrine: diabetes[ Y ];                    Physical Exam: BP 115/71 (BP Location: Right Arm)   Pulse 94   Temp 98 F (36.7 C) (Oral)   Resp 20   Ht 5' 2 (1.575 m)   Wt 77.6 kg   SpO2 99%   BMI 31.29 kg/m    General appearance: alert, cooperative, and no distress Head: Normocephalic, without obvious abnormality, atraumatic Neck: no carotid bruit, no JVD, and supple, symmetrical, trachea midline Resp: clear to auscultation bilaterally Cardio: RRR, no murmur GI: Soft, some diffuse tenderness (per patient, from coughing), bowel sounds present Extremities: No RLE edema. Left BKA well healed Neurologic: Grossly normal     Recent Radiology Findings:   Echocardiogram done 09/11/2023: IMPRESSIONS   1. No apical thrombus with Definity  contrast. Left ventricular ejection  fraction, by estimation, is 40 to 45%. The left ventricle has mildly  decreased function. The left ventricle demonstrates regional wall motion  abnormalities (see scoring  diagram/findings for description). Left ventricular diastolic parameters  are consistent with Grade I diastolic dysfunction (impaired relaxation).  There is severe hypokinesis of the left ventricular, entire inferior wall,  anterior segment, inferolateral  wall and lateral wall. The apical lateral wall is hyperdynamic.   2. Right ventricular systolic function is moderately reduced. The right  ventricular size is normal. There is normal pulmonary artery systolic  pressure. The estimated right ventricular systolic pressure is 30.2 mmHg.   3. The mitral valve is grossly normal. Trivial mitral valve  regurgitation.   4. The aortic valve was not well visualized. Aortic valve regurgitation  is not visualized. No aortic stenosis is present.   5. The inferior vena cava is dilated in size with <50% respiratory   variability, suggesting right atrial pressure of 15 mmHg.   Comparison(s): Changes from prior study are noted. 06/03/2023: LVEF 60-65%.   Conclusion(s)/Recommendation(s): Findings suggest probable LCX territory  ischemia/infarct.   FINDINGS   Left Ventricle: No apical thrombus with Definity  contrast. Left  ventricular ejection fraction, by estimation, is 40 to 45%. The left  ventricle has mildly decreased function. The left ventricle demonstrates  regional wall motion abnormalities. Severe  hypokinesis of the left ventricular, entire inferior wall, anterior  segment, inferolateral wall and lateral wall. The left ventricular  internal cavity size was normal in size. There is no left ventricular  hypertrophy. Left ventricular diastolic  parameters are consistent with Grade I diastolic dysfunction (impaired  relaxation). Indeterminate filling pressures.     LV Wall Scoring:  The entire lateral wall is hypokinetic. The apical lateral wall is  hyperdynamic.  Right Ventricle: The right ventricular size is normal. No increase in  right ventricular wall thickness. Right ventricular systolic function is  moderately reduced. There is normal pulmonary artery systolic pressure.  The tricuspid regurgitant velocity is  1.95 m/s, and with an assumed right atrial pressure of 15 mmHg, the  estimated right ventricular systolic pressure is 30.2 mmHg.   Left Atrium: Left atrial size was normal in size.   Right Atrium: Right atrial size was normal in size.   Pericardium: There is no evidence of pericardial effusion.   Mitral Valve: The mitral valve is grossly normal. Trivial mitral valve  regurgitation.   Tricuspid Valve: The tricuspid valve is grossly normal. Tricuspid valve  regurgitation is mild.   Aortic Valve: The aortic valve was not well visualized. Aortic valve  regurgitation is not visualized. No aortic stenosis is present.   Pulmonic Valve: The pulmonic valve was normal in  structure. Pulmonic valve  regurgitation is not visualized.   Aorta: The aortic root and ascending aorta are structurally normal, with  no evidence of dilitation.   Venous: The inferior vena cava is dilated in size with less than 50%  respiratory variability, suggesting right atrial pressure of 15 mmHg.   IAS/Shunts: No atrial level shunt detected by color flow Doppler.   CARDIAC CATHETERIZATION Result Date: 09/13/2023 Images from the original result were not included. Coronary angiography 09/13/2023: LM: No significant disease LAD: Prox diffuse 70% disease with severe calcification (Severity could be underestimated given the diffuse nature of the disease)          Mid 90% stenosis          Diag 2 with prox diffuse 80% disease Lcx: No significant disease RCA: Prox 100% occlusion with severe prox-mid calcification          Left-to-tight collaterals from LAD to RPDA          RPDA diffuse disease at least 60% LVEDP 12 mmHg Severe multivessel disease in a diabetic patient. Presentation more likely chronic coronary syndrome than true ACS. Anatomically, she has good targets for surgical revascularization in at least LAD and diag, RPDA target is questionable. Recommend surgical consult for at least a discussion regarding CABG.  If she is not deemed to be a surgical candidate at this time, could consider medical management for CAD and GDMT for HFrEF, and consider CABG in the near future.  However, if she is deemed not a surgical candidate due to all her other comorbidities, including left BKA, COPD, we could consider PCI options.  While her focal stenosis is in mid LAD, her diffuse disease in proximal and mid LAD is likely physiologically significant.  She would require atherectomy for optimal percutaneous revascularization to this LAD, also bearing in mind the possibility of requiring 2 stenting strategy given severe diffuse disease in a large diagonal 2.  Overall, percutaneous revascularization options are  complex and high risk.  Other option could be medical management alone given her presentation of rather chronic coronary syndrome rather than ACS. Manish JINNY Lawrence, MD    Diagnostic Dominance: Right   CT HEAD WO CONTRAST ( ) Result Date: 09/12/2023 EXAM: CT HEAD WITHOUT CONTRAST 09/12/2023 10:32:00 PM TECHNIQUE: CT of the head was performed without the administration of intravenous contrast. Automated exposure control, iterative reconstruction, and/or weight based adjustment of the mA/kV was utilized to reduce the radiation dose to as low as reasonably achievable. COMPARISON: MRI brain dated 06/02/2023. CLINICAL HISTORY: Head trauma, minor, normal mental status (Age 67-63y); hospital fall.  FINDINGS: BRAIN AND VENTRICLES: There is no acute intracranial hemorrhage, mass effect or midline shift. No abnormal extra-axial fluid collection. The gray-white differentiation is maintained without an acute infarct. There is no hydrocephalus. Global cortical atrophy. Subcortical and periventricular small vessel ischemic changes. Intracranial atherosclerosis. ORBITS: The visualized portion of the orbits demonstrate no acute abnormality. SINUSES: The visualized paranasal sinuses and mastoid air cells demonstrate no acute abnormality. SOFT TISSUES AND SKULL: No acute abnormality of the visualized skull or soft tissues. IMPRESSION: 1. No acute intracranial abnormality. 2. Atrophy with small vessel ischemic changes. Electronically signed by: Pinkie Pebbles MD 09/12/2023 10:35 PM EDT RP Workstation: [id number]     I have independently reviewed the above radiologic studies and discussed with the patient   Recent Lab Findings: Lab Results  Component Value Date   WBC 12.9 (H) 09/13/2023   HGB 9.0 (L) 09/13/2023   HCT 29.7 (L) 09/13/2023   PLT 397 09/13/2023   GLUCOSE 136 (H) 09/13/2023   CHOL 189 06/03/2023   TRIG 178 (H) 06/03/2023   HDL 39 (L) 06/03/2023   LDLCALC 114 (H) 06/03/2023   ALT 10 08/08/2023    AST 19 08/08/2023   NA 138 09/13/2023   K 3.9 09/13/2023   CL 108 09/13/2023   CREATININE 1.14 (H) 09/13/2023   BUN 24 (H) 09/13/2023   CO2 20 (L) 09/13/2023   TSH 1.279 08/20/2021   INR 0.9 06/02/2023   HGBA1C 7.5 (H) 06/02/2023   Assessment / Plan:    1. S/p NSTEMI, coronary artery disease-on Heparin  drip. Dr. Kerrin to review catheterization films. She would benefit  from CABG but Dr. Kerrin to determine if that is the best option with multiple commodities. 2. History of DM-On Insulin . HGA1C in March 7.5. She will need close medical follow up after discharge 3. History of hypertension-on Losartan  25 mg every pm 4. History of hyperlipidemia-on Atorvastatin  80 mg at hs 5. History of CVA 6. History of COPD-May have had exacerbation on presentation. Mild leukocytosis-on Methyl prednisone  since 06/18recent .COVID infection (about 2 1/2 weeks ago). Will ask pulmonary to evaluate. 7. History of OSA (on CPAP) 8. History of obesity 9. Anemia-H and H yesterday 9 and 29.7. On Ferrous sulfate  10. History of CKD( IIIA) Chronic Kidney Disease   Stage I     GFR >90  Stage II    GFR 60-89  Stage IIIA GFR 45-59  Stage IIIB GFR 30-44  Stage IV   GFR 15-29  Stage V    GFR  <15  Lab Results  Component Value Date   CREATININE 1.14 (H) 09/13/2023   Estimated Creatinine Clearance: 45.6 mL/min (A) (by C-G formula based on SCr of 1.14 mg/dL (H)).  11. History of anxiety and depression-on Buspar  and Prozac  12. History of PAD-had left BKA  I  spent 25 minutes counseling the patient face to face.   Donielle Zimmerman PA-C 09/14/2023 9:13 AM   Patient seen and examined, records reviewed, cath images reviewed.  68 yo woman with numerous medical problems including diabetes, stage IIIA CKD, left BKA, obesity, stroke, tracheomalacia, COPD, recurrent bronchitis, recent COVID infection, acute on chronic systolic and diastolic heart failure, and severe 2 vessel CAD  Cath shows CTO of RCA,  LAD with long segment stenosis followed by 90%  stenosis and diagonal with 80% lesion.  Both LAD and diagonal are graftable.  The PDA may or may not be graftable target.   Would be high risk given her comorbidities, but potentially a candidate once pulmonary  status improves.  She would not be able to go home alone after surgery and likely would need long term SNF postop.  I discussed the general nature of the procedure with Anna [name] and her granddaughters.  She understands high risk nature of the procedure with risks including death, stroke, MI, blood clots, infections, bleeding, possible need for transfusion, respiratory or renal failure as well as the possibility of other unforeseeable complications.  She will discuss with her family.  Elspeth MOTE Kerrin, MD [facility] [phone]

## 2023-09-14 NOTE — Progress Notes (Signed)
 Progress Note  Patient Name: Anna [name] Date of Encounter: 09/14/2023 [facility] Cardiologist: None Dr.Kristel Durkee (new)  Interval Summary    Denies active chest pain at rest. Accompanied by granddaughter is at bedside. No cardiac events overnight Underwent left heart catheterization yesterday, results reviewed and questions answered  Vital Signs Vitals:   09/14/23 0100 09/14/23 0508 09/14/23 0537 09/14/23 0748  BP:  114/67 121/81 115/71  Pulse: 90 88 92 94  Resp:   (!) 22 20  Temp:   98.3 F (36.8 C) 98 F (36.7 C)  TempSrc:   Oral Oral  SpO2:   98% 99%  Weight:   77.6 kg   Height:        Intake/Output Summary (Last 24 hours) at 09/14/2023 1108 Last data filed at 09/14/2023 0947 Gross per 24 hour  Intake 97.69 ml  Output 800 ml  Net -702.31 ml   Net IO Since Admission: -1,542.31 mL [09/14/23 1109]     09/14/2023    5:37 AM 09/13/2023    6:32 PM 09/09/2023    4:56 PM  Last 3 Weights  Weight (lbs) 171 lb 1.2 oz 173 lb 4.5 oz 160 lb  Weight (kg) 77.6 kg 78.6 kg 72.576 kg      Telemetry/ECG  Sinus rhythm/tachycardia - Personally Reviewed  Physical Exam  GEN: No acute distress.   Neck: + JVD Cardiac: Tachycardia, positive S1-S2 no murmurs, rubs, or gallops.  Respiratory: Clear to auscultation bilaterally. GI: Soft, nontender, non-distended  MS: No edema, left BKA  Assessment & Plan  NSTEMI  Symptoms of cardiac chest pain w/ exertion on going for weeks.  Hs Troponins elevated. Echocardiogram independently reviewed, LVEF 35 to 40% with regional wall motion abnormalities Left heart catheterization 09/13/2023-multivessel CAD. CT surgery evaluating the patient to see if she is a candidate for bypass surgery versus complex percutaneous intervention Pulmonary medicine to see to help optimize her pulmonary status given her recent COVID-19 infection Telemetry independently reviewed this morning. Continue IV heparin  drip Good urine output with initiation  of Bumex .   Continue aspirin  81 mg p.o. daily. Continue Lipitor  80 mg milligrams p.o. daily   HFrEF  Newly discovered, acute  Stage C, NYHA class II/III Clinically appears to be euvolemic with BNP levels tend to be high Patient is allergic to Lasix which caused her to have shortness of breath and rash.   Consulted w/ inpatient pharmacy and based on records reviewed she has not received lasix. Will try Bumex  1mg  IVP (no reaction) Patient was on spironolactone  50 mg p.o. twice daily.   Will down titrated 25 mg p.o. daily so GDMT can be uptitrated. Continue losartan  25 mg p.o. every afternoon, eventually entresto. Not ideal candidate for SGLT2 inhibitors given history of urinary tract infection/sepsis Will further uptitrate GDMT.SABRA  Peripheral artery disease status post BKA: Chronic and stable follows with vascular surgery  History of stroke: Reemphasized the importance of secondary prevention with focus on improving the modifiable cardiovascular risk factors such as glycemic control, lipid management, blood pressure control, weight loss.  Insulin -dependent diabetes mellitus type 2: Currently on insulin   Hypertension: Medication changes as discussed  Hyperlipidemia: Continue Lipitor  80 mg p.o. daily  Recent COVID-19 infection: Has received antivirals & primary follow-up  Medical Decision Making: High Uptitrate GDMT for her heart failure with reduced EF Vital signs, telemetry and labs from 09/14/2023 independently reviewed Progress notes over the last 24 hours by nursing staff & primary team reviewed. Results of heart catheterization reviewed with the patient  and family. Case reviewed with CT surgery on morning rounds as well.   For questions or updates, please contact [facility] Please consult [email or web address] for contact info under       Signed, [name], [facility]  A Division of [facility] [address] [place], [place]  [address]  [place], [place] [address] Pager: [phone] Office: [phone]

## 2023-09-14 NOTE — Consult Note (Signed)
 NAME:  Anna [name], MRN:  [id number], DOB:  1955/06/01, LOS: 4 ADMISSION DATE:  09/09/2023, CONSULTATION DATE:  09/14/2023 REFERRING MD:  Kathrin Mignon DASEN, MD, CHIEF COMPLAINT:  pre-operative pulmonary evaluation  History of Present Illness:  Anna [name] is a 68 y.o. woman with multi-vessel CAD and Acute HFrEF EF 40% who presented with NSTEMI. Pulmonary is consulted to assist with pre-operative pulmonary evaluation and management of chronic conditions prior to CABG. She has additional medical history of OSA on CPAP, asthma/chronic bronchitis (<10 pack year smoking history), Obesity, PAD s/p left BKA. She was initially admitted on 6/16 for COPD exacerbation treated with steroids, abx, bronchodilators. Did not require oxygen therapy.   She notes heart burn, chronic cough worse when laying flat, and worse with eating. Notes frequent soda intake.  She had covid 19 infection about 3 weeks ago and was treated as an outpatient with paxlovid . She is functionally wheelchair bound due to her amputation and gets around in a manual wheelchair. She has been more short of breath over the last 2-3 months.   She smoked intermittently 1-2 cigarettes at a time socially and never bought any packs. She notes significant passive smoke exposure in childhood and occasional bouts of bronchitis every 2-3 years. She takes albuterol  and duonebs prn at home with a nebulizer machine. No maintenance inhalers.   She is supposed to wear a CPAP but she lost it. She feels much better now that she's wearing one now.    Pertinent  Medical History  DM2  OSA on cpap Copd Obesity PAD s/p Left BKA Hfref CAD   Significant Hospital Events: Including procedures, antibiotic start and stop dates in addition to other pertinent events     Interim History / Subjective:    Objective    Blood pressure 134/85, pulse (!) 109, temperature 98.5 F (36.9 C), temperature source Oral, resp. rate 18, height 5' 2 (1.575 m), weight  77.6 kg, SpO2 (!) 9%.    FiO2 (%):  [21 %] 21 %   Intake/Output Summary (Last 24 hours) at 09/14/2023 1603 Last data filed at 09/14/2023 1122 Gross per 24 hour  Intake 97.69 ml  Output 1100 ml  Net -1002.31 ml   Filed Weights   09/09/23 1656 09/13/23 1832 09/14/23 0537  Weight: 72.6 kg 78.6 kg 77.6 kg    Examination: General: elderly chronically ill woman, on room air HENT: mmm Lungs: diminished, harsh barking cough most noted at anterior neck Cardiovascular: RRR Abdomen: obese, soft Extremities: left BKA, trace pedal edema Neuro: normal speech, no focal asymmetry  CT Chest 6/18 shows bilateral pleural effusions, diffuse ggos consistent with acute pulmonary edema, potentially underlying infection. 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  Covid swab non reactive.  Hgb 9.6    Resolved problem list   Assessment and Plan   Mild intermittent asthma Former Passive smoke exposure Former tobacco use disorder (<10 pack years) Preoperative pulmonary evaluation OSA (not adherent to CPAP at home) Uncontrolled GERD Severe Tracheomalacia  Most of her current cough is related to uncontrolled GERD. Continue PPI. We discussed lifestyle modifications for reducing GERD trigger foods. She currently drinks a lot of carbonated beverages. Recommend cough suppression as you are doing with delsym , tessalon , hycodan. Continue PPI.   Regarding her dyspnea - most of this is tracheomalacia from being overweight, steroids, smoking. Would stop steroids. The treatment for tracheomalacia is essentially to use CPAP for OSA to help with airway stenting prn. Continue CPAP while in the hospital. She can follow  up with our office as an outpatient to re-establish cpap therapy.   For asthma/chronic bronchitis continue pulmicort , brovana , yupelri prn and bronchodilators as needed.  Agree with cardiology diuresis to euvoemia  She has a high risk of post-operative pulmonary complications by  ARISCAT Index.  The absolute assessment of risk/benefit of the procedure is deferred to the primary team's evaluation.  In order to minimize the risk of complications and optimize pulmonary status, we recommend the following: - Encourage aggressive incentive spirometry hourly both peri-operatively and post-operatively as tolerated  - Early ambulation and physical therapy as tolerated post-operatively - Adequate pain control especially in the setting of abdominal and thoracic surgery - Bronchodilators as needed for wheezing or shortness of breath - Oral steroids only if the patient appears to have component of COPD exacerbation, otherwise no routine utilization needed - Intraoperatively keep OR time to the shortest as possible - Post operatively may benefit from Nocturnal Bipap  Preoperative Risk Calculation: Postoperative respiratory failure (PRF) is considered as failure to wean from mechanical ventilation within 48 hours of surgery or unplanned intubation/reintubation postoperatively. The validated risk calculator provides a risk estimate of PRF and is anticipated to aid in surgical decision-making and informed patient consent.  However risk can be accepted given the potential benefit of this intervention and it is not prohibitive.  The features of this patient's history that contribute to the pulmonary risk assessment include:  Age, General anesthesia, thoracic surgery, surgery >2 hours.  0 to 25 points: Low risk: 1.6% pulmonary complication rate  26 to 44 points: Intermediate risk: 13.3% pulmonary complication rate  (71 points) 45 to 123 points: High risk: 42.1% pulmonary complication rate  ARISCAT: Mazo et al. Anesthesiology [phone]  Thank you for this consult. Please don't hesitate to contact if we can be of further assistance.   Verdon Gore, MD Pulmonary and Critical Care Medicine [facility] 09/14/2023 5:32 PM Pager: see AMION  If no response to pager, please  call critical care on call (see AMION) until 7pm After 7:00 pm call Elink     Labs   CBC: Recent Labs  Lab 09/09/23 1931 09/10/23 0528 09/11/23 0620 09/12/23 0335 09/13/23 0408 09/14/23 1139  WBC 11.4* 11.4* 13.1* 11.6* 12.9* 12.8*  NEUTROABS 6.3  --   --   --   --   --   HGB 9.8* 9.6* 9.0* 8.9* 9.0* 9.6*  HCT 33.3* 32.2* 29.9* 29.9* 29.7* 31.2*  MCV 95.4 94.2 92.9 94.0 94.0 91.8  PLT 489* 430* 395 395 397 441*    Basic Metabolic Panel: Recent Labs  Lab 09/10/23 0528 09/11/23 0620 09/12/23 0335 09/13/23 0408 09/14/23 1139  NA 135 135 134* 138 136  K 3.8 4.8 4.1 3.9 4.2  CL 106 106 105 108 103  CO2 17* 21* 21* 20* 21*  GLUCOSE 404* 161* 229* 136* 184*  BUN 14 15 22  24* 30*  CREATININE 1.24* 1.05* 1.12* 1.14* 1.47*  CALCIUM  8.2* 8.5* 8.7* 8.8* 8.4*  MG  --   --   --   --  2.3   GFR: Estimated Creatinine Clearance: 35.3 mL/min (A) (by C-G formula based on SCr of 1.47 mg/dL (H)). Recent Labs  Lab 09/10/23 1344 09/11/23 0620 09/12/23 0335 09/13/23 0408 09/14/23 1139  PROCALCITON <0.10  --   --   --   --   WBC  --  13.1* 11.6* 12.9* 12.8*    Liver Function Tests: No results for input(s): AST, ALT, ALKPHOS, BILITOT, PROT, ALBUMIN in the  last 168 hours. No results for input(s): LIPASE, AMYLASE in the last 168 hours. No results for input(s): AMMONIA in the last 168 hours.  ABG    Component Value Date/Time   TCO2 22 06/02/2023 1012     Coagulation Profile: No results for input(s): INR, PROTIME in the last 168 hours.  Cardiac Enzymes: No results for input(s): CKTOTAL, CKMB, CKMBINDEX, TROPONINI in the last 168 hours.  HbA1C: Hgb A1c MFr Bld  Date/Time Value Ref Range Status  06/02/2023 10:01 AM 7.5 (H) 4.8 - 5.6 % Final    Comment:    (NOTE) Pre diabetes:          5.7%-6.4%  Diabetes:              >6.4%  Glycemic control for   <7.0% adults with diabetes   02/01/2023 11:15 AM 7.0 (H) 4.8 - 5.6 % Final    Comment:     (NOTE) Pre diabetes:          5.7%-6.4%  Diabetes:              >6.4%  Glycemic control for   <7.0% adults with diabetes     CBG: Recent Labs  Lab 09/13/23 0802 09/13/23 1208 09/13/23 2110 09/14/23 0748 09/14/23 1146  GLUCAP 154* 168* 207* 261* 187*    Review of Systems:   Chest pain, shortness of breath, lower extremity edema Wheezing GERD   Past Medical History:  She,  has a past medical history of Anemia, Anxiety, Arthritis, Asthma, Back pain, Bronchitis, Bursitis, Chronic kidney disease, COPD (chronic obstructive pulmonary disease) (HCC), Depression, Diabetes mellitus, Diabetic neuropathy (HCC), Diabetic retinopathy, GERD (gastroesophageal reflux disease), Hyperlipemia, Hypertension, Migraine, Obesity, Pneumonia, Sepsis due to urinary tract infection (HCC), Sepsis secondary to UTI (HCC) (09/30/2022), and Stroke (HCC).   Surgical History:   Past Surgical History:  Procedure Laterality Date   ABDOMINAL AORTOGRAM W/LOWER EXTREMITY Left 08/22/2021   Procedure: ABDOMINAL AORTOGRAM W/LOWER EXTREMITY;  Surgeon: Serene Gaile ORN, MD;  Location: [facility] INVASIVE CV LAB;  Service: Cardiovascular;  Laterality: Left;   AMPUTATION Left 10/11/2021   Procedure: LEFT BELOW KNEE AMPUTATION;  Surgeon: Harden Jerona GAILS, MD;  Location: [facility] OR;  Service: Orthopedics;  Laterality: Left;   CESAREAN SECTION     x3   CHOLECYSTECTOMY     CYSTOSCOPY WITH RETROGRADE PYELOGRAM, URETEROSCOPY AND STENT PLACEMENT Right 02/06/2023   Procedure: CYSTOSCOPY WITH RIGHT  RETROGRADE PYELOGRAM AND RIGHT URETERAL STENT EXCHANGE;  Surgeon: Devere Lonni Righter, MD;  Location: [facility] ORS;  Service: Urology;  Laterality: Right;  30 MINUTES   CYSTOSCOPY WITH STENT PLACEMENT Right 09/30/2022   Procedure: CYSTOSCOPY WITH STENT PLACEMENT;  Surgeon: Carolee Sherwood JONETTA DOUGLAS, MD;  Location: [facility] OR;  Service: Urology;  Laterality: Right;   EYE SURGERY Left    retina surgery had laser   LEFT HEART CATH AND CORONARY ANGIOGRAPHY  N/A 09/13/2023   Procedure: LEFT HEART CATH AND CORONARY ANGIOGRAPHY;  Surgeon: Elmira Newman PARAS, MD;  Location: [facility];  Service: Cardiovascular;  Laterality: N/A;   METATARSAL HEAD EXCISION Left 08/24/2021   Procedure: METATARSAL HEAD EXCISION FIFTH TOE;  Surgeon: Gershon Donnice SAUNDERS, DPM;  Location: [facility] OR;  Service: Podiatry;  Laterality: Left;   PERIPHERAL VASCULAR BALLOON ANGIOPLASTY  08/22/2021   Procedure: PERIPHERAL VASCULAR BALLOON ANGIOPLASTY;  Surgeon: Serene Gaile ORN, MD;  Location: [facility];  Service: Cardiovascular;;  Left AT   WOUND DEBRIDEMENT Left 08/24/2021   Procedure: DEBRIDEMENT WOUND OF LEFT FOOT;  Surgeon: Gershon Donnice SAUNDERS, DPM;  Location: [facility] OR;  Service: Podiatry;  Laterality: Left;     Social History:   reports that she has never smoked. She has never used smokeless tobacco. She reports that she does not drink alcohol and does not use drugs.   Family History:  Her family history is not on file.   Allergies Allergies  Allergen Reactions   Lasix [Furosemide] Shortness Of Breath and Rash   Bacitracin Other (See Comments)    Burns skin     Ciprofloxacin  Swelling   Citalopram Diarrhea   Doxycycline  Swelling and Other (See Comments)    burning all over body     Latex Itching   Morphine And Codeine  Hives   Duloxetine Other (See Comments)    Mental Status Changes (intolerance) saw pictures of gun   Levaquin [Levofloxacin In D5w] Other (See Comments)    Pt does not remember reaction but states she's had issues with med   Salvia Officinalis Other (See Comments)    Sage- sneezing    Soma [Carisoprodol] Other (See Comments)    Sleepy and constipation       Home Medications  Prior to Admission medications   Medication Sig Start Date End Date Taking? Authorizing Provider  acetaminophen  (TYLENOL ) 500 MG tablet Take 1,000-1,500 mg by mouth every 8 (eight) hours as needed for moderate pain (pain score 4-6).   Yes [provider]  acidophilus (RISAQUAD) CAPS capsule Take 1 capsule by mouth daily.   Yes [provider]  albuterol  (VENTOLIN  HFA) 108 (90 Base) MCG/ACT inhaler Inhale 1-2 puffs into the lungs every 6 (six) hours as needed for wheezing or shortness of breath. 10/04/22  Yes Dennise Lavada POUR, MD  aspirin  EC 81 MG tablet Take 81 mg by mouth daily. Swallow whole.   Yes [provider]  atorvastatin  (LIPITOR ) 80 MG tablet Take 1 tablet (80 mg total) by mouth at bedtime. 06/04/23  Yes Regalado, Belkys A, MD  busPIRone  (BUSPAR ) 7.5 MG tablet Take 7.5 mg by mouth 2 (two) times daily.   Yes [provider]  Dulaglutide 3 MG/0.5ML SOPN Inject 3 mg into the skin every Saturday.   Yes [provider]  estradiol (ESTRACE) 0.1 MG/GM vaginal cream Place 1 Applicatorful vaginally as needed (dryness / irritation).   Yes [provider]  ferrous sulfate  325 (65 FE) MG tablet Take 325 mg by mouth daily with breakfast.   Yes [provider]  FLUoxetine  (PROZAC ) 40 MG capsule Take 40 mg by mouth daily.   Yes [provider]  fluticasone (FLONASE) 50 MCG/ACT nasal spray Place 1 spray into both nostrils daily as needed for allergies or rhinitis.   Yes [provider]  gabapentin  (NEURONTIN ) 400 MG capsule Take 400 mg by mouth 3 (three) times daily.   Yes [provider]  hydrOXYzine  (ATARAX ) 10 MG tablet Take 1 tablet (10 mg total) by mouth 2 (two) times daily. Patient taking differently: Take 10 mg by mouth every 8 (eight) hours as needed for anxiety. 10/13/21  Yes Samtani, Jai-Gurmukh, MD  insulin  glargine, 1 Unit Dial , (TOUJEO  SOLOSTAR) 300 UNIT/ML Solostar Pen Inject 10 Units into the skin at bedtime. Patient taking differently: Inject 25 Units into the skin at bedtime. 06/04/23  Yes Regalado, Belkys A, MD  ipratropium-albuterol  (DUONEB) 0.5-2.5 (3) MG/3ML SOLN Use 3 ml by nebulization twice a day scheduled and every 4 hours as needed for  shortness of breath and wheezing Patient taking differently: Take 3  mLs by nebulization daily as needed (SOB and wheezing). Use 3 ml by nebulization twice a day scheduled and every 4 hours as needed for shortness of breath and wheezing 10/04/22  Yes Singh, Prashant K, MD  Lactobacillus Rhamnosus, GG, (CULTURELLE HEALTH & WELLNESS) CAPS Take 1 capsule by mouth daily. 08/05/21  Yes [provider]  lidocaine  4 % Place 1 patch onto the skin daily as needed (pain).   Yes [provider]  lisinopril  (ZESTRIL ) 5 MG tablet Take 5 mg by mouth at bedtime.   Yes [provider]  loratadine  (CLARITIN ) 10 MG tablet Take 10 mg by mouth daily as needed for allergies.   Yes [provider]  LORazepam  (ATIVAN ) 0.5 MG tablet Take 0.5 mg by mouth daily as needed for anxiety.   Yes [provider]  meclizine (ANTIVERT) 25 MG tablet Take 25 mg by mouth daily as needed for dizziness. 10/01/19  Yes [provider]  Menthol , Topical Analgesic, (BIOFREEZE EX) Apply 1 Application topically daily as needed (neck and shoulder pain).   Yes [provider]  metFORMIN  (GLUCOPHAGE ) 1000 MG tablet Take 1,000 mg by mouth 2 (two) times daily with a meal.   Yes [provider]  nystatin  (MYCOSTATIN ) 100000 UNIT/ML suspension Take 1 mL by mouth 4 (four) times daily as needed (red gums).   Yes [provider]  nystatin  cream (MYCOSTATIN ) Apply 1 application. topically 4 (four) times daily as needed for dry skin.   Yes [provider]  ondansetron  (ZOFRAN -ODT) 4 MG disintegrating tablet Take 1 tablet (4 mg total) by mouth every 8 (eight) hours as needed for nausea or vomiting. 08/08/23  Yes Ula Prentice SAUNDERS, MD  pantoprazole  (PROTONIX ) 40 MG tablet Take 40 mg by mouth 2 (two) times daily.   Yes [provider]  rOPINIRole  (REQUIP ) 0.25 MG tablet Take 0.25 mg by mouth at bedtime.   Yes [provider]  sennosides-docusate sodium  (SENOKOT-S)  8.6-50 MG tablet Take 1 tablet by mouth 2 (two) times daily as needed for constipation.   Yes [provider]  tamsulosin  (FLOMAX ) 0.4 MG CAPS capsule Take 1 capsule (0.4 mg total) by mouth daily. 10/04/22  Yes Singh, Prashant K, MD  topiramate  (TOPAMAX ) 50 MG tablet Take 1 tablet (50 mg total) by mouth 2 (two) times daily. 06/05/23  Yes Rai, Ripudeep K, MD  Apoaequorin (PREVAGEN) 10 MG CAPS Take 10 mg by mouth daily. Patient not taking: Reported on 09/10/2023    [provider]  insulin  aspart (NOVOLOG ) 100 UNIT/ML FlexPen Use as directed before each meal 3 times a day: 140-199 - 2 units, 200-250 - 4 units, 251-299 - 6 units,  300-349 - 8 units,  350 or above 10 units. Patient not taking: Reported on 09/10/2023 10/04/22   Singh, Prashant K, MD

## 2023-09-14 NOTE — Progress Notes (Signed)
 PROGRESS NOTE  Anna [name] FMW:[id number] DOB: Apr 11, 1955   PCP: Cloria Annabella CROME, DO  Patient is from: Home.  DOA: 09/09/2023 LOS: 4  Chief complaints Chief Complaint  Patient presents with   Cough     Brief Narrative / Interim history: 68 year old F with PMH of COPD, CVA, IDDM-2, left BKA, HTN, anxiety, depression and reported COVID-19 infection about 2 and half weeks prior for which she was treated with Paxlovid  presents with shortness of breath and cough, and admitted with COPD exacerbation.  In ED, she was slightly tachycardic and tachypneic. Cr 1.19.  WBC 11.4.  Hgb 9.8.  Platelet 484.  UA with large LE.  CXR without acute finding.  A 20 pathogen RVP nonreactive.  Patient was started on Solu-Medrol , ceftriaxone  and nebulizers and admitted.  CT angio chest negative for PE but scattered basilar predominant interstitial and groundglass opacities compatible with given history of recent COVID-19 pneumonia, borderline enlarged mediastinal adenopathy likely reactive and nonspecific mild periportal edema.  TTE with LVEF of 40 to 45%, RWMA, G1 DD and RVSP of 30 mmHg.  BNP elevated to 1300.  Troponin elevated to 385.  Cardiology consulted.  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  CTS consulted.  Plan for CABG once pulmonary status improves.  COVID-19 swab nonreactive.  Subjective: Seen and examined earlier this morning.  No major events overnight of this morning.  Continues to endorse dry cough and abdominal pain from coughing a lot.  Denies chest pain or shortness of breath.  Multiple family members at bedside.  Objective: Vitals:   09/14/23 0508 09/14/23 0537 09/14/23 0748 09/14/23 1117  BP: 114/67 121/81 115/71 134/85  Pulse: 88 92 94 99  Resp:  (!) 22 20 20   Temp:  98.3 F (36.8 C) 98 F (36.7 C) 98.5 F (36.9 C)  TempSrc:  Oral Oral Oral  SpO2:  98% 99% 96%  Weight:  77.6 kg    Height:        Examination:  GENERAL: No apparent distress.  Nontoxic. HEENT: MMM.  Vision  and hearing grossly intact.  NECK: Supple.  No apparent JVD.  RESP:  No IWOB.  Frequently coughing.  Rhonchi.  CVS:  RRR. Heart sounds normal.  ABD/GI/GU: BS+. Abd soft, NTND.  MSK/EXT:  Moves extremities.  Left BKA.  Trace RLE edema. SKIN: no apparent skin lesion or wound NEURO: AA.  Oriented appropriately.  No apparent focal neuro deficit. PSYCH: Calm. Normal affect.   Consultants:  Cardiology  Procedures: 6/20-LHC with multivessel CAD  Microbiology summarized: Urine culture with multiple species A-20 pathogen RVP nonreactive COVID-19 PCR nonreactive  Assessment and plan: COPD with acute exacerbation in the setting of recent COVID-19 infection: Still with significant dry cough and rhonchi.  CXR negative.  Pro-Cal negative.  CRP less than 0.5.  ESR elevated.  CT angio chest as above. -Continue Solu-Medrol  and nebulizers. -Mucolytic's and antitussive .  Acute sHFmrEF: TTE with LVEF of 40 to 45%, RWMA and RVSP of 30 mmHg.  No acute ischemic changes on EKG.  BNP elevated to 1300.  Troponin elevated 385.  Allergic to furosemide. -Appreciate help by cardiology-started Bumex , losartan , Aldactone  -Plan for LHC -Strict intake and output, daily weight, renal functions and electrolytes  Non-STEMI: Reportedly had exertional chest pain going on for weeks.  Troponin elevated.  EKG without acute ischemic finding.  TTE as above.  LHC with multivessel CAD. -Continue IV heparin , Lipitor  and aspirin  -CTS consulted for CABG evaluation.   Recent COVID-19 infection: Treated with Paxlovid  outpatient  3 weeks ago.  Rapid COVID-19 swab negative -Discontinue isolation precaution  IDDM-2 with hyperglycemia, neuropathy: Hyperglycemia likely due to steroid.  A1c 5.7%. Recent Labs  Lab 09/13/23 0802 09/13/23 1208 09/13/23 2110 09/14/23 0748 09/14/23 1146  GLUCAP 154* 168* 207* 261* 187*  -Continue SSI-resistant - Added NovoLog  3 units 3 times daily with meals - Increase Semglee  from 10 to 15 units  daily. -Continue gabapentin  and Lipitor  -Further adjustment as appropriate  AKI on CKD-3A: b/l Cr ~1.1.  Likely from hypotensive episode, LHC, losartan  and Aldactone . Recent Labs    02/01/23 1115 06/02/23 1012 06/02/23 1350 08/08/23 1217 09/09/23 1931 09/10/23 0528 09/11/23 0620 09/12/23 0335 09/13/23 0408 09/14/23 1139  BUN 18 13 11 10 13 14 15 22  24* 30*  CREATININE 0.84 1.10* 1.02* 1.09* 1.19* 1.24* 1.05* 1.12* 1.14* 1.47*  - Continue monitoring - Avoid hypotension.   Normocytic anemia: Stable -Continue monitoring   Generalized weakness/left BKA -PT/OT  Concern for yeast infection - Antibiotics discontinued. -Nystatin .  Constipation -Bowel regimen after LHC  Mood disorder/chronic pain - Continue home meds  Body mass index is 31.29 kg/m.           DVT prophylaxis:    Code Status: Full code Family Communication: Updated patient's multiple family members at bedside. Level of care: Progressive Cardiac Status is: Inpatient Remains inpatient appropriate because: Non-STEMI, multivessel CAD, acute CHF and COPD exacerbation   Final disposition: To be determined   55 minutes with more than 50% spent in reviewing records, counseling patient/family and coordinating care.   Sch Meds:  Scheduled Meds:  aspirin  EC  81 mg Oral Daily   atorvastatin   80 mg Oral QHS   benzonatate   100 mg Oral TID   bumetanide  (BUMEX ) IV  1 mg Intravenous Daily   busPIRone   7.5 mg Oral BID   dextromethorphan   30 mg Oral BID   ferrous sulfate   325 mg Oral Q breakfast   FLUoxetine   40 mg Oral Daily   gabapentin   400 mg Oral TID   guaiFENesin   600 mg Oral BID   insulin  aspart  0-20 Units Subcutaneous TID WC   insulin  aspart  0-5 Units Subcutaneous QHS   insulin  aspart  3 Units Subcutaneous TID WC   insulin  glargine-yfgn  15 Units Subcutaneous Daily   loratadine   10 mg Oral Daily   losartan   25 mg Oral QPM   methylPREDNISolone  (SOLU-MEDROL ) injection  40 mg Intravenous Q12H    nystatin    Topical BID   pantoprazole   40 mg Oral BID   polyethylene glycol  17 g Oral BID   rOPINIRole   0.25 mg Oral QHS   senna-docusate  2 tablet Oral BID   spironolactone   25 mg Oral Daily   tamsulosin   0.4 mg Oral Daily   topiramate   50 mg Oral BID   Continuous Infusions:  heparin  1,200 Units/hr (09/14/23 1233)   PRN Meds:.acetaminophen  **OR** acetaminophen , diphenhydrAMINE , HYDROcodone  bit-homatropine, hydrOXYzine , ipratropium-albuterol , lip balm, Muscle Rub, oxyCODONE , polyethylene glycol **FOLLOWED BY** polyethylene glycol, prochlorperazine , sodium chloride  flush  Antimicrobials: Anti-infectives (From admission, onward)    Start     Dose/Rate Route Frequency Ordered Stop   09/10/23 1700  fluconazole  (DIFLUCAN ) tablet 150 mg        150 mg Oral  Once 09/10/23 1603 09/10/23 1736   09/10/23 0130  cefTRIAXone  (ROCEPHIN ) 1 g in sodium chloride  0.9 % 100 mL IVPB  Status:  Discontinued        1 g 200 mL/hr over 30 Minutes Intravenous  Daily at bedtime 09/10/23 0115 09/13/23 1424        I have personally reviewed the following labs and images: CBC: Recent Labs  Lab 09/09/23 1931 09/10/23 0528 09/11/23 0620 09/12/23 0335 09/13/23 0408 09/14/23 1139  WBC 11.4* 11.4* 13.1* 11.6* 12.9* 12.8*  NEUTROABS 6.3  --   --   --   --   --   HGB 9.8* 9.6* 9.0* 8.9* 9.0* 9.6*  HCT 33.3* 32.2* 29.9* 29.9* 29.7* 31.2*  MCV 95.4 94.2 92.9 94.0 94.0 91.8  PLT 489* 430* 395 395 397 441*   BMP &GFR Recent Labs  Lab 09/10/23 0528 09/11/23 0620 09/12/23 0335 09/13/23 0408 09/14/23 1139  NA 135 135 134* 138 136  K 3.8 4.8 4.1 3.9 4.2  CL 106 106 105 108 103  CO2 17* 21* 21* 20* 21*  GLUCOSE 404* 161* 229* 136* 184*  BUN 14 15 22  24* 30*  CREATININE 1.24* 1.05* 1.12* 1.14* 1.47*  CALCIUM  8.2* 8.5* 8.7* 8.8* 8.4*  MG  --   --   --   --  2.3   Estimated Creatinine Clearance: 35.3 mL/min (A) (by C-G formula based on SCr of 1.47 mg/dL (H)). Liver & Pancreas: No results for  input(s): AST, ALT, ALKPHOS, BILITOT, PROT, ALBUMIN in the last 168 hours. No results for input(s): LIPASE, AMYLASE in the last 168 hours. No results for input(s): AMMONIA in the last 168 hours. Diabetic: No results for input(s): HGBA1C in the last 72 hours. Recent Labs  Lab 09/13/23 0802 09/13/23 1208 09/13/23 2110 09/14/23 0748 09/14/23 1146  GLUCAP 154* 168* 207* 261* 187*   Cardiac Enzymes: No results for input(s): CKTOTAL, CKMB, CKMBINDEX, TROPONINI in the last 168 hours. No results for input(s): PROBNP in the last 8760 hours. Coagulation Profile: No results for input(s): INR, PROTIME in the last 168 hours. Thyroid Function Tests: No results for input(s): TSH, T4TOTAL, FREET4, T3FREE, THYROIDAB in the last 72 hours. Lipid Profile: No results for input(s): CHOL, HDL, LDLCALC, TRIG, CHOLHDL, LDLDIRECT in the last 72 hours. Anemia Panel: Recent Labs    09/12/23 0335  VITAMINB12 667  FOLATE 8.4  FERRITIN 32  TIBC 353  IRON 24*  RETICCTPCT 3.1   Urine analysis:    Component Value Date/Time   COLORURINE YELLOW 09/09/2023 2336   APPEARANCEUR HAZY (A) 09/09/2023 2336   LABSPEC 1.008 09/09/2023 2336   PHURINE 5.0 09/09/2023 2336   GLUCOSEU NEGATIVE 09/09/2023 2336   HGBUR MODERATE (A) 09/09/2023 2336   BILIRUBINUR NEGATIVE 09/09/2023 2336   KETONESUR NEGATIVE 09/09/2023 2336   PROTEINUR NEGATIVE 09/09/2023 2336   UROBILINOGEN 1.0 06/23/2014 1950   NITRITE NEGATIVE 09/09/2023 2336   LEUKOCYTESUR LARGE (A) 09/09/2023 2336   Sepsis Labs: Invalid input(s): PROCALCITONIN, LACTICIDVEN  Microbiology: Recent Results (from the past 240 hours)  Urine Culture     Status: Abnormal   Collection Time: 09/09/23 11:36 PM   Specimen: Urine, Random  Result Value Ref Range Status   Specimen Description URINE, RANDOM  Final   Special Requests   Final    NONE Reflexed from [id number] Performed at [facility],  [address], [place], [place] [address]    Culture MULTIPLE SPECIES PRESENT, SUGGEST RECOLLECTION (A)  Final   Report Status 09/10/2023 FINAL  Final  Respiratory (~20 pathogens) panel by PCR     Status: None   Collection Time: 09/10/23  8:41 AM   Specimen: Anterior Nasal Swab; Respiratory  Result Value Ref Range Status   Adenovirus  NOT DETECTED NOT DETECTED Final   Coronavirus 229E NOT DETECTED NOT DETECTED Final    Comment: (NOTE) The Coronavirus on the Respiratory Panel, DOES NOT test for the novel  Coronavirus (2019 nCoV)    Coronavirus HKU1 NOT DETECTED NOT DETECTED Final   Coronavirus NL63 NOT DETECTED NOT DETECTED Final   Coronavirus OC43 NOT DETECTED NOT DETECTED Final   Metapneumovirus NOT DETECTED NOT DETECTED Final   Rhinovirus / Enterovirus NOT DETECTED NOT DETECTED Final   Influenza A NOT DETECTED NOT DETECTED Final   Influenza B NOT DETECTED NOT DETECTED Final   Parainfluenza Virus 1 NOT DETECTED NOT DETECTED Final   Parainfluenza Virus 2 NOT DETECTED NOT DETECTED Final   Parainfluenza Virus 3 NOT DETECTED NOT DETECTED Final   Parainfluenza Virus 4 NOT DETECTED NOT DETECTED Final   Respiratory Syncytial Virus NOT DETECTED NOT DETECTED Final   Bordetella pertussis NOT DETECTED NOT DETECTED Final   Bordetella Parapertussis NOT DETECTED NOT DETECTED Final   Chlamydophila pneumoniae NOT DETECTED NOT DETECTED Final   Mycoplasma pneumoniae NOT DETECTED NOT DETECTED Final    Comment: Performed at [facility], [address], [place], [place] [address]  SARS Coronavirus 2 by RT PCR (hospital order, performed in [facility] hospital lab) *cepheid single result test* Anterior Nasal Swab     Status: None   Collection Time: 09/13/23  8:08 AM   Specimen: Anterior Nasal Swab  Result Value Ref Range Status   SARS Coronavirus 2 by RT PCR NEGATIVE NEGATIVE Final    Comment: Performed at [facility], [address] [place], [place] [address]    Radiology  Studies: CARDIAC CATHETERIZATION Result Date: 09/13/2023 Images from the original result were not included. Coronary angiography 09/13/2023: LM: No significant disease LAD: Prox diffuse 70% disease with severe calcification (Severity could be underestimated given the diffuse nature of the disease)          Mid 90% stenosis          Diag 2 with prox diffuse 80% disease Lcx: No significant disease RCA: Prox 100% occlusion with severe prox-mid calcification          Left-to-tight collaterals from LAD to RPDA          RPDA diffuse disease at least 60% LVEDP 12 mmHg Severe multivessel disease in a diabetic patient. Presentation more likely chronic coronary syndrome than true ACS. Anatomically, she has good targets for surgical revascularization in at least LAD and diag, RPDA target is questionable. Recommend surgical consult for at least a discussion regarding CABG.  If she is not deemed to be a surgical candidate at this time, could consider medical management for CAD and GDMT for HFrEF, and consider CABG in the near future.  However, if she is deemed not a surgical candidate due to all her other comorbidities, including left BKA, COPD, we could consider PCI options.  While her focal stenosis is in mid LAD, her diffuse disease in proximal and mid LAD is likely physiologically significant.  She would require atherectomy for optimal percutaneous revascularization to this LAD, also bearing in mind the possibility of requiring 2 stenting strategy given severe diffuse disease in a large diagonal 2.  Overall, percutaneous revascularization options are complex and high risk.  Other option could be medical management alone given her presentation of rather chronic coronary syndrome rather than ACS. Manish JINNY Lawrence, MD      Marleena Shubert T. Layia [facility]  If 7PM-7AM, please contact night-coverage [email or web address] 09/14/2023,  1:37 PM

## 2023-09-15 ENCOUNTER — Encounter (HOSPITAL_COMMUNITY): Payer: Medicare (Managed Care)

## 2023-09-15 DIAGNOSIS — I25118 Atherosclerotic heart disease of native coronary artery with other forms of angina pectoris: Secondary | ICD-10-CM

## 2023-09-15 DIAGNOSIS — I5021 Acute systolic (congestive) heart failure: Secondary | ICD-10-CM | POA: Diagnosis not present

## 2023-09-15 DIAGNOSIS — I739 Peripheral vascular disease, unspecified: Secondary | ICD-10-CM | POA: Diagnosis not present

## 2023-09-15 DIAGNOSIS — I214 Non-ST elevation (NSTEMI) myocardial infarction: Secondary | ICD-10-CM | POA: Diagnosis not present

## 2023-09-15 LAB — CBC
HCT: 31.6 % — ABNORMAL LOW (ref 36.0–46.0)
Hemoglobin: 9.5 g/dL — ABNORMAL LOW (ref 12.0–15.0)
MCH: 27.4 pg (ref 26.0–34.0)
MCHC: 30.1 g/dL (ref 30.0–36.0)
MCV: 91.1 fL (ref 80.0–100.0)
Platelets: 344 10*3/uL (ref 150–400)
RBC: 3.47 MIL/uL — ABNORMAL LOW (ref 3.87–5.11)
RDW: 15.7 % — ABNORMAL HIGH (ref 11.5–15.5)
WBC: 12.9 10*3/uL — ABNORMAL HIGH (ref 4.0–10.5)
nRBC: 0 % (ref 0.0–0.2)

## 2023-09-15 LAB — BASIC METABOLIC PANEL WITH GFR
Anion gap: 9 (ref 5–15)
BUN: 33 mg/dL — ABNORMAL HIGH (ref 8–23)
CO2: 23 mmol/L (ref 22–32)
Calcium: 8.6 mg/dL — ABNORMAL LOW (ref 8.9–10.3)
Chloride: 105 mmol/L (ref 98–111)
Creatinine, Ser: 1.26 mg/dL — ABNORMAL HIGH (ref 0.44–1.00)
GFR, Estimated: 47 mL/min — ABNORMAL LOW (ref >60)
Glucose, Bld: 116 mg/dL — ABNORMAL HIGH (ref 70–99)
Potassium: 4.3 mmol/L (ref 3.5–5.1)
Sodium: 137 mmol/L (ref 135–145)

## 2023-09-15 LAB — BRAIN NATRIURETIC PEPTIDE: B Natriuretic Peptide: 964.5 pg/mL — ABNORMAL HIGH (ref 0.0–100.0)

## 2023-09-15 LAB — GLUCOSE, CAPILLARY
Glucose-Capillary: 128 mg/dL — ABNORMAL HIGH (ref 70–99)
Glucose-Capillary: 173 mg/dL — ABNORMAL HIGH (ref 70–99)
Glucose-Capillary: 173 mg/dL — ABNORMAL HIGH (ref 70–99)
Glucose-Capillary: 110 mg/dL — ABNORMAL HIGH (ref 70–99)

## 2023-09-15 LAB — MAGNESIUM: Magnesium: 2.2 mg/dL (ref 1.7–2.4)

## 2023-09-15 LAB — HEPARIN LEVEL (UNFRACTIONATED)
Heparin Unfractionated: 0.81 [IU]/mL — ABNORMAL HIGH (ref 0.30–0.70)
Heparin Unfractionated: 0.76 [IU]/mL — ABNORMAL HIGH (ref 0.30–0.70)

## 2023-09-15 MED ORDER — METOPROLOL SUCCINATE ER 25 MG PO TB24
25.0000 mg | ORAL_TABLET | Freq: Every day | ORAL | Status: DC
  Administered 2023-09-15 – 2023-09-23 (×8): 25 mg via ORAL
  Filled 2023-09-15 (×8): qty 1

## 2023-09-15 NOTE — Progress Notes (Signed)
 PHARMACY - ANTICOAGULATION CONSULT NOTE  Pharmacy Consult for heparin  Indication: multivessel CAD  Allergies  Allergen Reactions   Lasix [Furosemide] Shortness Of Breath and Rash   Bacitracin Other (See Comments)    Burns skin     Ciprofloxacin  Swelling   Citalopram Diarrhea   Doxycycline  Swelling and Other (See Comments)    burning all over body     Latex Itching   Morphine And Codeine  Hives   Duloxetine Other (See Comments)    Mental Status Changes (intolerance) saw pictures of gun   Levaquin [Levofloxacin In D5w] Other (See Comments)    Pt does not remember reaction but states she's had issues with med   Salvia Officinalis Other (See Comments)    Sage- sneezing    Soma [Carisoprodol] Other (See Comments)    Sleepy and constipation      Patient Measurements: Height: 5' 2 (157.5 cm) Weight: 77.8 kg (171 lb 8.3 oz) IBW/kg (Calculated) : 50.1 HEPARIN  DW (KG): 67.4  Vital Signs: Temp: 98.5 F (36.9 C) (06/22 1655) Temp Source: Oral (06/22 1655) BP: 93/60 (06/22 1715)  Labs: Recent Labs    09/13/23 0408 09/13/23 0805 09/14/23 1139 09/15/23 0441 09/15/23 0830 09/15/23 1624  HGB 9.0*  --  9.6* 9.5*  --   --   HCT 29.7*  --  31.2* 31.6*  --   --   PLT 397  --  441* 344  --   --   HEPARINUNFRC  --    < > 0.20* 0.81*  --  0.76*  CREATININE 1.14*  --  1.47*  --  1.26*  --    < > = values in this interval not displayed.    Estimated Creatinine Clearance: 41.3 mL/min (A) (by C-G formula based on SCr of 1.26 mg/dL (H)).   Assessment: 72 yoF admitted with PNA found to have elevated troponins. Pharmacy consulted for IV heparin  dosing. Pt s/p cath 6/20 which shows multivessel CAD. TCTS consulted. Heparin  resumed while sorting out revascularization plans.  Heparin  level remains slightly elevated at 0.76, confirmed it was drawn from opposite extremity. No bleeding issues reported per RN  Goal of Therapy:  Heparin  level 0.3-0.7 units/ml Monitor platelets by  anticoagulation protocol: Yes   Plan:  Reduce heparin  to 1000 units/h Recheck heparin  level with AM labs  Rocky Slade, PharmD, BCPS Clinical Pharmacist [phone] Please check AMION for all [facility] Pharmacy numbers 09/15/2023

## 2023-09-15 NOTE — Progress Notes (Signed)
 Occupational Therapy Evaluation Patient Details Name: Anna [name] MRN: [id number] DOB: [birth year] Today's Date: 09/15/2023   History of Present Illness   Anna [name] is a 68 y.o. female admitted 09/09/23 for COPD exacerbation. She reports a COVID-19 infection about 2.5 weeks prior which was treated with Paxlovid . LHC on 6/20 demonstrated multivessel CAD. Plan for CABG once pulmonary status improves. PMHx: CAD, HFrEF, COPD, CVA, IDDM-2, PAD s/p left BKA, HTN, anxiety, depression, OSA on CPAP, asthma/chronic bronchitis, and obesity.     Clinical Impressions Pt admitted for above, she reports that she is typically ind with ADLs and transfers to w/c or using RW with her prosthesis but recently with SOB she has been needing assist with bathing/dressing and limited by SOB. Pt currently presenting with decreased activity tolerance, impaired balance, and generalized weakness. She required mod A to safely transfer to/from and mod A-CGA for ADLs. Discussed with pt the current recommendations for skilled rehab <3hrs of inpatient therapy given that if she does have a CABG it could limit her significantly but we can revisit that if procedure occurs. OT to continue following pt acutely to progress pt as able.     If plan is discharge home, recommend the following:   A little help with walking and/or transfers;A little help with bathing/dressing/bathroom;Assistance with cooking/housework;Assist for transportation     Functional Status Assessment   Patient has had a recent decline in their functional status and demonstrates the ability to make significant improvements in function in a reasonable and predictable amount of time.     Equipment Recommendations   Other (comment) Armed forces technical officer for home)     Recommendations for Other Services         Precautions/Restrictions   Precautions Precautions: Fall Recall of Precautions/Restrictions: Intact Restrictions Weight Bearing  Restrictions Per Provider Order: No     Mobility Bed Mobility Overal bed mobility: Needs Assistance Bed Mobility: Sit to Supine       Sit to supine: Contact guard assist, Used rails   General bed mobility comments: in recliner on arrival    Transfers Overall transfer level: Needs assistance Equipment used: Rolling walker (2 wheels) Transfers: Sit to/from Stand, Bed to chair/wheelchair/BSC Sit to Stand: Mod assist Stand pivot transfers: Mod assist         General transfer comment: chair>BSC>chair>bed      Balance Overall balance assessment: Needs assistance Sitting-balance support: Feet supported, Bilateral upper extremity supported Sitting balance-Leahy Scale: Fair     Standing balance support: Reliant on assistive device for balance, Single extremity supported Standing balance-Leahy Scale: Poor Standing balance comment: Reliant on UE support.                           ADL either performed or assessed with clinical judgement   ADL Overall ADL's : Needs assistance/impaired Eating/Feeding: Independent;Sitting   Grooming: Sitting;Contact guard assist   Upper Body Bathing: Sitting;Contact guard assist   Lower Body Bathing: Sitting/lateral leans;Minimal assistance   Upper Body Dressing : Sitting;Set up   Lower Body Dressing: Sit to/from stand;Sitting/lateral leans;Moderate assistance   Toilet Transfer: Moderate assistance;Stand-pivot;BSC/3in1 Statistician Details (indicate cue type and reason): to the L Toileting- Clothing Manipulation and Hygiene: Sit to/from stand;Moderate assistance Toileting - Clothing Manipulation Details (indicate cue type and reason): to maintain balance while doffing briefs.     Functional mobility during ADLs: Moderate assistance;Rolling walker (2 wheels) (pivot) General ADL Comments: Pt curious of post op precautions post CABG, explained  to pt typical sternal precautions and how they would limit her particuarly with her  being a L BKA and relying on that UE strength.     Vision Baseline Vision/History: 1 Wears glasses Vision Assessment?: No apparent visual deficits     Perception Perception: Not tested       Praxis Praxis: WFL       Pertinent Vitals/Pain Pain Assessment Pain Assessment: Faces Faces Pain Scale: Hurts little more Pain Location: stomach-from coughing pt reports. Pain Descriptors / Indicators: Discomfort, Aching Pain Intervention(s): Monitored during session, Limited activity within patient's tolerance     Extremity/Trunk Assessment Upper Extremity Assessment Upper Extremity Assessment: Generalized weakness   Lower Extremity Assessment Lower Extremity Assessment: Generalized weakness;LLE deficits/detail;RLE deficits/detail RLE Deficits / Details: WFL ROM. RLE Sensation: history of peripheral neuropathy RLE Coordination: WNL LLE Deficits / Details: WFL ROM. Pt s/p L BKA. LLE Sensation: history of peripheral neuropathy LLE Coordination: WNL   Cervical / Trunk Assessment Cervical / Trunk Assessment: Normal   Communication Communication Communication: Impaired Factors Affecting Communication: Reduced clarity of speech (voice a little low and raspy.)   Cognition Arousal: Alert Behavior During Therapy: WFL for tasks assessed/performed, Lability Cognition: No apparent impairments             OT - Cognition Comments: a bit labile regarding severity of her situation.                 Following commands: Intact       Cueing  General Comments   Cueing Techniques: Verbal cues  BP 93/57(69) while sitting in recliner. HR stable.   Exercises     Shoulder Instructions      Home Living Family/patient expects to be discharged to:: Private residence Living Arrangements: Alone Available Help at Discharge: Personal care attendant;Available PRN/intermittently (PCA comes 2 hours a day 5 days a week.) Type of Home: Apartment Home Access: Level entry     Home  Layout: One level     Bathroom Shower/Tub: Chief Strategy Officer: Standard Bathroom Accessibility: Yes   Home Equipment: BSC/3in1;Grab bars - tub/shower;Wheelchair - Forensic psychologist (2 wheels);Tub bench   Additional Comments: Pt reports recieving PACE services.      Prior Functioning/Environment Prior Level of Function : Independent/Modified Independent             Mobility Comments: ModI. Transfers to/from manual w/c without AD. Mobilizes in manual w/c by pushing with BUE support and/or pulling with RLE. Pt reports intermittently wearing L BKA prosthesis. For the past 3-4 weeks she has required increased time to complete all tasks d/t weakness and SOB. Pt reports ambulating short distance in December 2024 using RW and wearing L BKA prosthesis. ADLs Comments: Reports aide assist with ADLs as she fears getting in/out of shower. Relies on the bus system for transportation. Has her licenses but is nervous to drive. used to use w/c to get to grocery store but limited by SOB now.    OT Problem List: Decreased activity tolerance;Impaired balance (sitting and/or standing);Cardiopulmonary status limiting activity;Decreased strength   OT Treatment/Interventions: Self-care/ADL training;Patient/family education;Therapeutic exercise;Balance training;Therapeutic activities      OT Goals(Current goals can be found in the care plan section)   Acute Rehab OT Goals Patient Stated Goal: To walk the [place] OT Goal Formulation: With patient Time For Goal Achievement: 09/29/23 Potential to Achieve Goals: Fair ADL Goals Pt Will Perform Lower Body Dressing: sitting/lateral leans;with supervision;with set-up Pt Will Transfer to Toilet: with supervision;bedside commode;stand pivot transfer  Pt Will Perform Tub/Shower Transfer: Tub transfer;tub bench;with supervision Pt/caregiver will Perform Home Exercise Program: Increased strength;With written HEP provided;Independently  (for BLEs in preparation for transfer training if post CABG)   OT Frequency:  Min 2X/week    Co-evaluation              AM-PAC OT 6 Clicks Daily Activity     Outcome Measure Help from another person eating meals?: None Help from another person taking care of personal grooming?: A Little Help from another person toileting, which includes using toliet, bedpan, or urinal?: A Lot Help from another person bathing (including washing, rinsing, drying)?: A Little Help from another person to put on and taking off regular upper body clothing?: A Little Help from another person to put on and taking off regular lower body clothing?: A Lot 6 Click Score: 17   End of Session Equipment Utilized During Treatment: Rolling walker (2 wheels) Nurse Communication: Mobility status  Activity Tolerance: Patient tolerated treatment well Patient left: in bed;with call bell/phone within reach;with bed alarm set;with family/visitor present  OT Visit Diagnosis: Unsteadiness on feet (R26.81);Other abnormalities of gait and mobility (R26.89);Muscle weakness (generalized) (M62.81)                Time: 8374-8288 OT Time Calculation (min): 46 min Charges:  OT General Charges $OT Visit: 1 Visit OT Evaluation $OT Eval Moderate Complexity: 1 Mod OT Treatments $Self Care/Home Management : 8-22 mins $Therapeutic Activity: 8-22 mins  09/15/2023  AB, OTR/L  Acute Rehabilitation Services  Office: [phone]   Curtistine JONETTA Das 09/15/2023, 6:15 PM

## 2023-09-15 NOTE — Progress Notes (Signed)
Placed patient on CPAP for the night with pressure set at 13cm

## 2023-09-15 NOTE — Progress Notes (Addendum)
 PHARMACY - ANTICOAGULATION CONSULT NOTE  Pharmacy Consult for heparin  Indication: multivessel CAD  Allergies  Allergen Reactions   Lasix [Furosemide] Shortness Of Breath and Rash   Bacitracin Other (See Comments)    Burns skin     Ciprofloxacin  Swelling   Citalopram Diarrhea   Doxycycline  Swelling and Other (See Comments)    burning all over body     Latex Itching   Morphine And Codeine  Hives   Duloxetine Other (See Comments)    Mental Status Changes (intolerance) saw pictures of gun   Levaquin [Levofloxacin In D5w] Other (See Comments)    Pt does not remember reaction but states she's had issues with med   Salvia Officinalis Other (See Comments)    Sage- sneezing    Soma [Carisoprodol] Other (See Comments)    Sleepy and constipation      Patient Measurements: Height: 5' 2 (157.5 cm) Weight: 77.8 kg (171 lb 8.3 oz) IBW/kg (Calculated) : 50.1 HEPARIN  DW (KG): 67.4  Vital Signs: Temp: 98.4 F (36.9 C) (06/22 0500) Temp Source: Oral (06/22 0500) BP: 109/65 (06/22 0500) Pulse Rate: 83 (06/22 0500)  Labs: Recent Labs     0000 09/12/23 0921 09/13/23 0408 09/13/23 0805 09/13/23 1229 09/14/23 1139 09/15/23 0441  HGB   < >  --  9.0*  --   --  9.6* 9.5*  HCT  --   --  29.7*  --   --  31.2* 31.6*  PLT  --   --  397  --   --  441* 344  HEPARINUNFRC  --   --   --    < > 0.69 0.20* 0.81*  CREATININE  --   --  1.14*  --   --  1.47*  --   TROPONINIHS  --  385*  --   --   --   --   --    < > = values in this interval not displayed.    Estimated Creatinine Clearance: 35.4 mL/min (A) (by C-G formula based on SCr of 1.47 mg/dL (H)).   Assessment: 26 yoF admitted with PNA found to have elevated troponins. Pharmacy consulted for IV heparin  dosing. Pt s/p cath 6/20 which shows multivessel CAD. TCTS consulted. Heparin  resumed while sorting out revascularization plans.  Heparin  level this morning is slightly elevated at 0.81, confirmed it was drawn from opposite  extremity. CBC ok.  Goal of Therapy:  Heparin  level 0.3-0.7 units/ml Monitor platelets by anticoagulation protocol: Yes   Plan:  Reduce heparin  to 1100 units/h Recheck heparin  level in 8h  Ozell Jamaica, PharmD, [place], [facility] Clinical Pharmacist [phone] Please check AMION for all [facility] Pharmacy numbers 09/15/2023

## 2023-09-15 NOTE — Evaluation (Signed)
 Physical Therapy Evaluation Patient Details Name: Anna [name] MRN: [id number] DOB: Feb 14, 1956 Today's Date: 09/15/2023  History of Present Illness  Anna [name] is a 68 y.o. female admitted 09/09/23 for COPD exacerbation. She reports a COVID-19 infection about 2.5 weeks prior which was treated with Paxlovid . LHC on 6/20 demonstrated multivessel CAD. Plan for CABG once pulmonary status improves. PMHx: CAD, HFrEF, COPD, CVA, IDDM-2, PAD s/p left BKA, HTN, anxiety, depression, OSA on CPAP, asthma/chronic bronchitis, and obesity.   Clinical Impression  Pt admitted with above diagnosis. PTA, pt was modI for functional mobility using a manual w/c and modI with ADLs/IADLs. She lives alone in a ground floor apartment with a level entry. Pt has a PCA 2 hours/day 5 days/week and receives PACE services. Pt currently with functional limitations due to the deficits listed below (see PT Problem List). She required modA for sit<>stand using RW. Pt c/o dizziness and her SBP dropped with the position change. She maintained static stance for ~1 min before fatiguing. Pt demonstrated generalized weakness and impaired balance. She will benefit from acute skilled PT to increase her independence and safety with mobility to allow discharge. Recommend post-acute rehab <3 hours/day of therapy.     If plan is discharge home, recommend the following: A lot of help with walking and/or transfers;A lot of help with bathing/dressing/bathroom;Assistance with cooking/housework;Assist for transportation;Help with stairs or ramp for entrance   Can travel by private vehicle   No    Equipment Recommendations Hospital bed  Recommendations for Other Services       Functional Status Assessment Patient has had a recent decline in their functional status and demonstrates the ability to make significant improvements in function in a reasonable and predictable amount of time.     Precautions / Restrictions  Precautions Precautions: Fall Recall of Precautions/Restrictions: Intact Restrictions Weight Bearing Restrictions Per Provider Order: No      Mobility  Bed Mobility               General bed mobility comments: Not assessed. Pt greeted seated in recliner chair and returned there at end of session.    Transfers Overall transfer level: Needs assistance Equipment used: Rolling walker (2 wheels) Transfers: Sit to/from Stand Sit to Stand: Mod assist           General transfer comment: Pt stood from recliner chair without L BKA prosthesis. She demonstrated proper hand placement using RW. Powered up with modA. Pt c/o dizziness upon standing. She maintained static stance for ~14min before returning to sit d/t fatigue. Good eccentric control.    Ambulation/Gait                  Stairs            Wheelchair Mobility     Tilt Bed    Modified Rankin (Stroke Patients Only)       Balance Overall balance assessment: Needs assistance Sitting-balance support: Feet supported, Bilateral upper extremity supported Sitting balance-Leahy Scale: Fair Sitting balance - Comments: Pt sat in recliner chair and scooted fwd/bkwd with UE support.   Standing balance support: Bilateral upper extremity supported, Reliant on assistive device for balance Standing balance-Leahy Scale: Poor Standing balance comment: Pt dependent on RW and modA for stability.                             Pertinent Vitals/Pain Pain Assessment Pain Assessment: 0-10 Pain Score: 9  Pain  Location: chest Pain Descriptors / Indicators: Discomfort, Pressure Pain Intervention(s): Premedicated before session, Monitored during session, Limited activity within patient's tolerance    Home Living Family/patient expects to be discharged to:: Private residence Living Arrangements: Alone Available Help at Discharge: Personal care attendant;Available PRN/intermittently (PCA comes 2 hours a day 5  days a week.) Type of Home: Apartment Home Access: Level entry       Home Layout: One level Home Equipment: Shower seat;BSC/3in1;Grab bars - tub/shower;Wheelchair - Forensic psychologist (2 wheels)      Prior Function Prior Level of Function : Independent/Modified Independent             Mobility Comments: ModI. Transfers to/from manual w/c without AD. Mobilizes in manual w/c by pushing with BUE support and/or pulling with RLE. Pt reports intermittently wearing L BKA prosthesis. For the past 3-4 weeks she has required increased time to complete all tasks d/t weakness and SOB. Pt reports ambulating short distance in December 2024 using RW and wearing L BKA prosthesis. ADLs Comments: ModI with ADL/IADLs. Relies on the bus system for transportation. Has her licenses but is nervous to drive. For the past 3-4 weeks she has required increased time to complete all tasks d/t weakness and SOB.     Extremity/Trunk Assessment   Upper Extremity Assessment Upper Extremity Assessment: Defer to OT evaluation    Lower Extremity Assessment Lower Extremity Assessment: Generalized weakness;RLE deficits/detail;LLE deficits/detail RLE Deficits / Details: WFL ROM. RLE Sensation: history of peripheral neuropathy RLE Coordination: WNL LLE Deficits / Details: WFL ROM. Pt s/p L BKA. LLE Sensation: history of peripheral neuropathy LLE Coordination: WNL    Cervical / Trunk Assessment Cervical / Trunk Assessment: Normal  Communication   Communication Communication: Impaired Factors Affecting Communication: Difficulty expressing self;Reduced clarity of speech (She reports increased difficulty with word finding. Pt with raspy voice secondary to coughing.)    Cognition Arousal: Alert Behavior During Therapy: WFL for tasks assessed/performed   PT - Cognitive impairments: No apparent impairments                       PT - Cognition Comments: Pt A,Ox4. Following commands: Intact        Cueing Cueing Techniques: Verbal cues     General Comments General comments (skin integrity, edema, etc.): BP: sitting 106/80, standing 95/60. Pt c/o dizziness in standing. HR and SpO2 stable.    Exercises     Assessment/Plan    PT Assessment Patient needs continued PT services  PT Problem List Decreased strength;Decreased activity tolerance;Decreased balance;Decreased mobility;Cardiopulmonary status limiting activity;Pain       PT Treatment Interventions DME instruction;Gait training;Functional mobility training;Therapeutic activities;Therapeutic exercise;Balance training;Patient/family education;Wheelchair mobility training    PT Goals (Current goals can be found in the Care Plan section)  Acute Rehab PT Goals Patient Stated Goal: Regain my independence. I would like to walk a little bit again. PT Goal Formulation: With patient/family Time For Goal Achievement: 09/29/23 Potential to Achieve Goals: Fair    Frequency Min 2X/week     Co-evaluation               AM-PAC PT 6 Clicks Mobility  Outcome Measure Help needed turning from your back to your side while in a flat bed without using bedrails?: A Little Help needed moving from lying on your back to sitting on the side of a flat bed without using bedrails?: A Lot Help needed moving to and from a bed to a chair (including a wheelchair)?:  A Lot Help needed standing up from a chair using your arms (e.g., wheelchair or bedside chair)?: A Lot Help needed to walk in hospital room?: Total Help needed climbing 3-5 steps with a railing? : Total 6 Click Score: 11    End of Session Equipment Utilized During Treatment: Gait belt Activity Tolerance: Patient tolerated treatment well Patient left: in chair;with call bell/phone within reach;with family/visitor present Nurse Communication: Mobility status PT Visit Diagnosis: Muscle weakness (generalized) (M62.81);Other abnormalities of gait and mobility (R26.89);Unsteadiness on  feet (R26.81);Pain Pain - part of body:  (chest)    Time: 8444-8378 PT Time Calculation (min) (ACUTE ONLY): 26 min   Charges:   PT Evaluation $PT Eval Moderate Complexity: 1 Mod   PT General Charges $$ ACUTE PT VISIT: 1 Visit         Randall SAUNDERS, PT, DPT Acute Rehabilitation Services Office: [phone] Secure Chat Preferred  Delon CHRISTELLA Callander 09/15/2023, 4:46 PM

## 2023-09-15 NOTE — Progress Notes (Signed)
 Progress Note  Patient Name: Anna [name] Date of Encounter: 09/15/2023 [facility] Cardiologist: None Dr.Doriana Mazurkiewicz (new)  Interval Summary    No active chest pain. Endorses discomfort with exertional activities prior to arrival Sitting up in the chair. Hemodynamically stable.   No cardiac events overnight  Vital Signs Vitals:   09/15/23 0032 09/15/23 0500 09/15/23 0746 09/15/23 0756  BP: (!) 93/58 109/65  111/75  Pulse: 76 83    Resp: 19 20  18   Temp: 98.2 F (36.8 C) 98.4 F (36.9 C)  (!) 97.5 F (36.4 C)  TempSrc: Oral Oral  Oral  SpO2: 100% 99% 98% 97%  Weight:  77.8 kg    Height:        Intake/Output Summary (Last 24 hours) at 09/15/2023 1031 Last data filed at 09/15/2023 0600 Gross per 24 hour  Intake 519 ml  Output 600 ml  Net -81 ml   Net IO Since Admission: -3,523.31 mL [09/15/23 1031]     09/15/2023    5:00 AM 09/14/2023    5:37 AM 09/13/2023    6:32 PM  Last 3 Weights  Weight (lbs) 171 lb 8.3 oz 171 lb 1.2 oz 173 lb 4.5 oz  Weight (kg) 77.8 kg 77.6 kg 78.6 kg      Telemetry/ECG  Sinus rhythm- Personally Reviewed  Physical Exam  GEN: No acute distress.   Neck: - JVD Cardiac: Regular, positive S1-S2 no murmurs, rubs, or gallops.  Respiratory: Clear to auscultation bilaterally. GI: Soft, nontender, non-distended  MS: No edema, left BKA  Assessment & Plan  NSTEMI  Symptoms of cardiac chest pain w/ exertion on going for weeks.  Hs Troponins elevated. Echocardiogram independently reviewed, LVEF 35 to 40% with regional wall motion abnormalities Left heart catheterization 09/13/2023-multivessel CAD. CT surgery evaluated the patient 09/14/2023 -recommending optimizing her pulmonary status and laboratory indices prior to final recommendations.   Pulmonary medicine has evaluated the patient as well.  Consultation reviewed.   Continue IV heparin  drip Good urine output with initiation of Bumex .   Continue aspirin  81 mg p.o. daily. Continue  Lipitor  80 mg milligrams p.o. daily Volume status is improving.  Renal function downtrending.  Will start low-dose Toprol -XL 25 mg p.o. daily  HFrEF  Newly discovered, acute  Stage C, NYHA class II Improving Clinically appears to be euvolemic with BNP levels tend to be high, will add on BNP to am labs Patient is allergic to Lasix which caused her to have shortness of breath and rash.   Consulted w/ inpatient pharmacy and based on records reviewed she has not received lasix. Will try Bumex  1mg  IVP (no reaction) Patient was on spironolactone  50 mg p.o. twice daily.  Will down titrated it to Aldactone  25 mg p.o. daily so GDMT can be uptitrated. Continue losartan  25 mg p.o. every afternoon, eventually entresto. Not ideal candidate for SGLT2 inhibitors given history of urinary tract infection/sepsis Start Toprol  XL 25mg  po qday w/ holding parameters  Will further uptitrate GDMT.SABRA  Peripheral artery disease status post BKA: Chronic and stable follows with vascular surgery  History of stroke: Reemphasized the importance of secondary prevention with focus on improving the modifiable cardiovascular risk factors such as glycemic control, lipid management, blood pressure control, weight loss.  Insulin -dependent diabetes mellitus type 2: Currently on insulin   Hypertension: Medication changes as discussed  Hyperlipidemia: Continue Lipitor  80 mg p.o. daily  Recent COVID-19 infection: Has received antivirals & primary follow-up  Medical Decision Making: Moderate Uptitrate GDMT for her heart failure  with reduced EF Vital signs, telemetry and labs from 09/15/2023 independently reviewed Progress and consultation notes over the last 24 hours by nursing staff & primary team reviewed.   For questions or updates, please contact [facility] Please consult [email or web address] for contact info under       Signed, [name], [facility]  A Division of [facility] [address] [place], [place] [address]  [place], [place] [address] Pager: [phone] Office: [phone]

## 2023-09-15 NOTE — Progress Notes (Signed)
 PROGRESS NOTE  Anna [name] FMW:[id number] DOB: 06/07/1955   PCP: Cloria Annabella CROME, DO  Patient is from: Home.  DOA: 09/09/2023 LOS: 5  Chief complaints Chief Complaint  Patient presents with   Cough     Brief Narrative / Interim history: 68 year old F with PMH of COPD, CVA, IDDM-2, left BKA, HTN, anxiety, depression and reported COVID-19 infection about 2 and half weeks prior for which she was treated with Paxlovid  presents with shortness of breath and cough, and admitted with COPD exacerbation.  In ED, she was slightly tachycardic and tachypneic. Cr 1.19.  WBC 11.4.  Hgb 9.8.  Platelet 484.  UA with large LE.  CXR without acute finding.  A 20 pathogen RVP nonreactive.  Patient was started on Solu-Medrol , ceftriaxone  and nebulizers and admitted.  CT angio chest negative for PE but scattered basilar predominant interstitial and groundglass opacities compatible with given history of recent COVID-19 pneumonia, borderline enlarged mediastinal adenopathy likely reactive and nonspecific mild periportal edema.  TTE with LVEF of 40 to 45%, RWMA, G1 DD and RVSP of 30 mmHg.  BNP elevated to 1300.  Troponin elevated to 385.  Cardiology consulted.  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  CTS consulted.  Plan for CABG once pulmonary status improves.  COVID-19 swab nonreactive.  Subjective: Seen and examined earlier this morning.  No major events overnight of this morning.  Continues to endorse cough and abdominal pain and chest pain related to cough.  She seems to cough less today.  Objective: Vitals:   09/15/23 0500 09/15/23 0746 09/15/23 0756 09/15/23 1126  BP: 109/65  111/75 110/72  Pulse: 83     Resp: 20  18 17   Temp: 98.4 F (36.9 C)  (!) 97.5 F (36.4 C) 98.4 F (36.9 C)  TempSrc: Oral  Oral Oral  SpO2: 99% 98% 97%   Weight: 77.8 kg     Height:        Examination:  GENERAL: No apparent distress.  Nontoxic. HEENT: MMM.  Vision and hearing grossly intact.  NECK: Supple.  No  apparent JVD.  RESP: On RA.  No IWOB.  Rhonchi.  CVS:  RRR. Heart sounds normal.  ABD/GI/GU: BS+. Abd soft, NTND.  MSK/EXT:  Moves extremities.  Left BKA.  Trace RLE edema. SKIN: no apparent skin lesion or wound NEURO: AA.  Oriented appropriately.  No apparent focal neuro deficit. PSYCH: Calm. Normal affect.   Consultants:  Cardiology Cardiothoracic surgery Pulmonology  Procedures: 6/20-LHC with multivessel CAD  Microbiology summarized: Urine culture with multiple species A-20 pathogen RVP nonreactive COVID-19 PCR nonreactive  Assessment and plan: Persistent cough: Multifactorial including asthma exacerbation, tracheomalacia, GERD and postviral from recent COVID-19 infection.  CXR negative.  Pro-Cal negative.  CRP less than 0.5.  ESR elevated.  Repeat COVID-19 PCR nonreactive.  CT angio chest as above.  No oxygen requirement -Received Solu-Medrol  for 5 days -Added Brovana , Pulmicort  and Yupelri on 6/21 -Continue DuoNebs as needed -Continue Protonix  40 mg twice daily -Discussed GERD triggers but does not seem to be compliant.  Has 1 bottle and 2 cans of soda, a cup of coffee and tea on her table.  -Mucolytic's and antitussive .  Acute HFmrEF: TTE with LVEF of 40 to 45%, RWMA and RVSP of 30 mmHg.  No acute ischemic changes on EKG.  BNP elevated to 1300.  Troponin elevated 385.  Allergic to furosemide.  LHC as below. -Appreciate help by cardiology-started Bumex , losartan , Aldactone  -Strict intake and output, daily weight, renal functions and electrolytes  Non-STEMI: Reportedly had exertional chest pain going on for weeks.  Troponin elevated.  EKG without acute ischemic finding.  TTE as above.  LHC with multivessel CAD. -Continue IV heparin , Lipitor  and aspirin  -CTS consulted for CABG evaluation.  Pulmonary consulted as well   Recent COVID-19 infection: Treated with Paxlovid  outpatient 3 weeks ago.  Rapid COVID-19 swab negative -Discontinue isolation precaution  IDDM-2 with  hyperglycemia, neuropathy: Hyperglycemia likely due to steroid.  A1c 5.7%. Recent Labs  Lab 09/14/23 1146 09/14/23 1658 09/14/23 2044 09/15/23 0759 09/15/23 1130  GLUCAP 187* 144* 113* 128* 173*  -Continue SSI-resistant - Continue NovoLog  3 units 3 times daily with meals - Continue Semglee  15 units daily. -Continue gabapentin  and Lipitor  -Further adjustment as appropriate  AKI on CKD-3A: b/l Cr ~1.1.  Likely from hypotensive episode, LHC, losartan  and Aldactone .  Improved. Recent Labs    06/02/23 1012 06/02/23 1350 08/08/23 1217 09/09/23 1931 09/10/23 0528 09/11/23 0620 09/12/23 0335 09/13/23 0408 09/14/23 1139 09/15/23 0830  BUN 13 11 10 13 14 15 22  24* 30* 33*  CREATININE 1.10* 1.02* 1.09* 1.19* 1.24* 1.05* 1.12* 1.14* 1.47* 1.26*  - Continue monitoring - Avoid hypotension.  Normocytic anemia: Stable -Continue monitoring   Generalized weakness/left BKA -PT/OT  Concern for yeast infection -Antibiotics discontinued. -Nystatin .  Constipation -Bowel regimen after LHC  Mood disorder/chronic pain - Continue home meds  Body mass index is 31.37 kg/m.           DVT prophylaxis:    Code Status: Full code Family Communication: Updated patient's multiple family members at bedside. Level of care: Progressive Cardiac Status is: Inpatient Remains inpatient appropriate because: Non-STEMI, multivessel CAD, acute CHF and COPD exacerbation   Final disposition: To be determined   55 minutes with more than 50% spent in reviewing records, counseling patient/family and coordinating care.   Sch Meds:  Scheduled Meds:  arformoterol   15 mcg Nebulization BID   aspirin  EC  81 mg Oral Daily   atorvastatin   80 mg Oral QHS   benzonatate   100 mg Oral TID   budesonide  (PULMICORT ) nebulizer solution  0.5 mg Nebulization BID   bumetanide  (BUMEX ) IV  1 mg Intravenous Daily   busPIRone   7.5 mg Oral BID   dextromethorphan   30 mg Oral BID   ferrous sulfate   325 mg Oral Q  breakfast   FLUoxetine   40 mg Oral Daily   gabapentin   400 mg Oral TID   guaiFENesin   600 mg Oral BID   insulin  aspart  0-20 Units Subcutaneous TID WC   insulin  aspart  0-5 Units Subcutaneous QHS   insulin  aspart  3 Units Subcutaneous TID WC   insulin  glargine-yfgn  15 Units Subcutaneous Daily   loratadine   10 mg Oral Daily   losartan   25 mg Oral QPM   metoprolol  succinate  25 mg Oral Daily   nystatin    Topical BID   pantoprazole   40 mg Oral BID   revefenacin  175 mcg Nebulization Daily   rOPINIRole   0.25 mg Oral QHS   senna-docusate  2 tablet Oral BID   spironolactone   25 mg Oral Daily   tamsulosin   0.4 mg Oral Daily   topiramate   50 mg Oral BID   Continuous Infusions:  heparin  1,100 Units/hr (09/15/23 0800)   PRN Meds:.acetaminophen  **OR** acetaminophen , diphenhydrAMINE , HYDROcodone  bit-homatropine, hydrOXYzine , ipratropium-albuterol , lip balm, Muscle Rub, oxyCODONE , [EXPIRED] polyethylene glycol **FOLLOWED BY** polyethylene glycol, prochlorperazine , sodium chloride  flush  Antimicrobials: Anti-infectives (From admission, onward)    Start  Dose/Rate Route Frequency Ordered Stop   09/10/23 1700  fluconazole  (DIFLUCAN ) tablet 150 mg        150 mg Oral  Once 09/10/23 1603 09/10/23 1736   09/10/23 0130  cefTRIAXone  (ROCEPHIN ) 1 g in sodium chloride  0.9 % 100 mL IVPB  Status:  Discontinued        1 g 200 mL/hr over 30 Minutes Intravenous Daily at bedtime 09/10/23 0115 09/13/23 1424        I have personally reviewed the following labs and images: CBC: Recent Labs  Lab 09/09/23 1931 09/10/23 0528 09/11/23 0620 09/12/23 0335 09/13/23 0408 09/14/23 1139 09/15/23 0441  WBC 11.4*   < > 13.1* 11.6* 12.9* 12.8* 12.9*  NEUTROABS 6.3  --   --   --   --   --   --   HGB 9.8*   < > 9.0* 8.9* 9.0* 9.6* 9.5*  HCT 33.3*   < > 29.9* 29.9* 29.7* 31.2* 31.6*  MCV 95.4   < > 92.9 94.0 94.0 91.8 91.1  PLT 489*   < > 395 395 397 441* 344   < > = values in this interval not displayed.    BMP &GFR Recent Labs  Lab 09/11/23 0620 09/12/23 0335 09/13/23 0408 09/14/23 1139 09/15/23 0830  NA 135 134* 138 136 137  K 4.8 4.1 3.9 4.2 4.3  CL 106 105 108 103 105  CO2 21* 21* 20* 21* 23  GLUCOSE 161* 229* 136* 184* 116*  BUN 15 22 24* 30* 33*  CREATININE 1.05* 1.12* 1.14* 1.47* 1.26*  CALCIUM  8.5* 8.7* 8.8* 8.4* 8.6*  MG  --   --   --  2.3 2.2   Estimated Creatinine Clearance: 41.3 mL/min (A) (by C-G formula based on SCr of 1.26 mg/dL (H)). Liver & Pancreas: No results for input(s): AST, ALT, ALKPHOS, BILITOT, PROT, ALBUMIN in the last 168 hours. No results for input(s): LIPASE, AMYLASE in the last 168 hours. No results for input(s): AMMONIA in the last 168 hours. Diabetic: No results for input(s): HGBA1C in the last 72 hours. Recent Labs  Lab 09/14/23 1146 09/14/23 1658 09/14/23 2044 09/15/23 0759 09/15/23 1130  GLUCAP 187* 144* 113* 128* 173*   Cardiac Enzymes: No results for input(s): CKTOTAL, CKMB, CKMBINDEX, TROPONINI in the last 168 hours. No results for input(s): PROBNP in the last 8760 hours. Coagulation Profile: No results for input(s): INR, PROTIME in the last 168 hours. Thyroid Function Tests: No results for input(s): TSH, T4TOTAL, FREET4, T3FREE, THYROIDAB in the last 72 hours. Lipid Profile: No results for input(s): CHOL, HDL, LDLCALC, TRIG, CHOLHDL, LDLDIRECT in the last 72 hours. Anemia Panel: No results for input(s): VITAMINB12, FOLATE, FERRITIN, TIBC, IRON, RETICCTPCT in the last 72 hours.  Urine analysis:    Component Value Date/Time   COLORURINE YELLOW 09/09/2023 2336   APPEARANCEUR HAZY (A) 09/09/2023 2336   LABSPEC 1.008 09/09/2023 2336   PHURINE 5.0 09/09/2023 2336   GLUCOSEU NEGATIVE 09/09/2023 2336   HGBUR MODERATE (A) 09/09/2023 2336   BILIRUBINUR NEGATIVE 09/09/2023 2336   KETONESUR NEGATIVE 09/09/2023 2336   PROTEINUR NEGATIVE 09/09/2023 2336    UROBILINOGEN 1.0 06/23/2014 1950   NITRITE NEGATIVE 09/09/2023 2336   LEUKOCYTESUR LARGE (A) 09/09/2023 2336   Sepsis Labs: Invalid input(s): PROCALCITONIN, LACTICIDVEN  Microbiology: Recent Results (from the past 240 hours)  Urine Culture     Status: Abnormal   Collection Time: 09/09/23 11:36 PM   Specimen: Urine, Random  Result Value Ref Range Status  Specimen Description URINE, RANDOM  Final   Special Requests   Final    NONE Reflexed from [id number] Performed at [facility], [address], [place], [place] [address]    Culture MULTIPLE SPECIES PRESENT, SUGGEST RECOLLECTION (A)  Final   Report Status 09/10/2023 FINAL  Final  Respiratory (~20 pathogens) panel by PCR     Status: None   Collection Time: 09/10/23  8:41 AM   Specimen: Anterior Nasal Swab; Respiratory  Result Value Ref Range Status   Adenovirus NOT DETECTED NOT DETECTED Final   Coronavirus 229E NOT DETECTED NOT DETECTED Final    Comment: (NOTE) The Coronavirus on the Respiratory Panel, DOES NOT test for the novel  Coronavirus (2019 nCoV)    Coronavirus HKU1 NOT DETECTED NOT DETECTED Final   Coronavirus NL63 NOT DETECTED NOT DETECTED Final   Coronavirus OC43 NOT DETECTED NOT DETECTED Final   Metapneumovirus NOT DETECTED NOT DETECTED Final   Rhinovirus / Enterovirus NOT DETECTED NOT DETECTED Final   Influenza A NOT DETECTED NOT DETECTED Final   Influenza B NOT DETECTED NOT DETECTED Final   Parainfluenza Virus 1 NOT DETECTED NOT DETECTED Final   Parainfluenza Virus 2 NOT DETECTED NOT DETECTED Final   Parainfluenza Virus 3 NOT DETECTED NOT DETECTED Final   Parainfluenza Virus 4 NOT DETECTED NOT DETECTED Final   Respiratory Syncytial Virus NOT DETECTED NOT DETECTED Final   Bordetella pertussis NOT DETECTED NOT DETECTED Final   Bordetella Parapertussis NOT DETECTED NOT DETECTED Final   Chlamydophila pneumoniae NOT DETECTED NOT DETECTED Final   Mycoplasma pneumoniae NOT DETECTED NOT DETECTED Final     Comment: Performed at [facility], [address] [place], [place] [address]  SARS Coronavirus 2 by RT PCR (hospital order, performed in [facility] hospital lab) *cepheid single result test* Anterior Nasal Swab     Status: None   Collection Time: 09/13/23  8:08 AM   Specimen: Anterior Nasal Swab  Result Value Ref Range Status   SARS Coronavirus 2 by RT PCR NEGATIVE NEGATIVE Final    Comment: Performed at [facility], [address], [place], [place] [address]    Radiology Studies: No results found.     Irie Fiorello T. Rylie Knierim [facility]  If 7PM-7AM, please contact night-coverage [email or web address] 09/15/2023, 11:41 AM

## 2023-09-16 ENCOUNTER — Inpatient Hospital Stay (HOSPITAL_COMMUNITY): Payer: Medicare (Managed Care)

## 2023-09-16 DIAGNOSIS — I739 Peripheral vascular disease, unspecified: Secondary | ICD-10-CM | POA: Diagnosis not present

## 2023-09-16 DIAGNOSIS — Z01810 Encounter for preprocedural cardiovascular examination: Secondary | ICD-10-CM | POA: Diagnosis not present

## 2023-09-16 DIAGNOSIS — I5021 Acute systolic (congestive) heart failure: Secondary | ICD-10-CM | POA: Diagnosis not present

## 2023-09-16 DIAGNOSIS — I25118 Atherosclerotic heart disease of native coronary artery with other forms of angina pectoris: Secondary | ICD-10-CM | POA: Diagnosis not present

## 2023-09-16 DIAGNOSIS — I214 Non-ST elevation (NSTEMI) myocardial infarction: Secondary | ICD-10-CM | POA: Diagnosis not present

## 2023-09-16 LAB — HEPARIN LEVEL (UNFRACTIONATED): Heparin Unfractionated: 0.47 [IU]/mL (ref 0.30–0.70)

## 2023-09-16 LAB — CBC
HCT: 30.5 % — ABNORMAL LOW (ref 36.0–46.0)
Hemoglobin: 9.3 g/dL — ABNORMAL LOW (ref 12.0–15.0)
MCH: 28 pg (ref 26.0–34.0)
MCHC: 30.5 g/dL (ref 30.0–36.0)
MCV: 91.9 fL (ref 80.0–100.0)
Platelets: 405 10*3/uL — ABNORMAL HIGH (ref 150–400)
RBC: 3.32 MIL/uL — ABNORMAL LOW (ref 3.87–5.11)
RDW: 15.8 % — ABNORMAL HIGH (ref 11.5–15.5)
WBC: 15.3 10*3/uL — ABNORMAL HIGH (ref 4.0–10.5)
nRBC: 0 % (ref 0.0–0.2)

## 2023-09-16 LAB — BASIC METABOLIC PANEL WITH GFR
Anion gap: 14 (ref 5–15)
BUN: 31 mg/dL — ABNORMAL HIGH (ref 8–23)
CO2: 22 mmol/L (ref 22–32)
Calcium: 8.6 mg/dL — ABNORMAL LOW (ref 8.9–10.3)
Chloride: 103 mmol/L (ref 98–111)
Creatinine, Ser: 1.4 mg/dL — ABNORMAL HIGH (ref 0.44–1.00)
GFR, Estimated: 41 mL/min — ABNORMAL LOW (ref >60)
Glucose, Bld: 82 mg/dL (ref 70–99)
Potassium: 4.2 mmol/L (ref 3.5–5.1)
Sodium: 139 mmol/L (ref 135–145)

## 2023-09-16 LAB — GLUCOSE, CAPILLARY
Glucose-Capillary: 82 mg/dL (ref 70–99)
Glucose-Capillary: 141 mg/dL — ABNORMAL HIGH (ref 70–99)
Glucose-Capillary: 101 mg/dL — ABNORMAL HIGH (ref 70–99)
Glucose-Capillary: 116 mg/dL — ABNORMAL HIGH (ref 70–99)

## 2023-09-16 LAB — MAGNESIUM: Magnesium: 2.2 mg/dL (ref 1.7–2.4)

## 2023-09-16 NOTE — Progress Notes (Addendum)
 Rounding Note   Patient Name: Anna MIFFLIN Date of Encounter: 09/16/2023  [facility] Cardiologist: New (Dr. Jeffrie)  Subjective No acute overnight events. She denies any chest pain. She reports feeling a little short of breath when talking for a while. She continues to have a cough.  Scheduled Meds:  arformoterol   15 mcg Nebulization BID   aspirin  EC  81 mg Oral Daily   atorvastatin   80 mg Oral QHS   benzonatate   100 mg Oral TID   budesonide  (PULMICORT ) nebulizer solution  0.5 mg Nebulization BID   bumetanide  (BUMEX ) IV  1 mg Intravenous Daily   busPIRone   7.5 mg Oral BID   dextromethorphan   30 mg Oral BID   ferrous sulfate   325 mg Oral Q breakfast   FLUoxetine   40 mg Oral Daily   gabapentin   400 mg Oral TID   guaiFENesin   600 mg Oral BID   insulin  aspart  0-20 Units Subcutaneous TID WC   insulin  aspart  0-5 Units Subcutaneous QHS   insulin  aspart  3 Units Subcutaneous TID WC   insulin  glargine-yfgn  15 Units Subcutaneous Daily   loratadine   10 mg Oral Daily   losartan   25 mg Oral QPM   metoprolol  succinate  25 mg Oral Daily   nystatin    Topical BID   pantoprazole   40 mg Oral BID   revefenacin  175 mcg Nebulization Daily   rOPINIRole   0.25 mg Oral QHS   senna-docusate  2 tablet Oral BID   spironolactone   25 mg Oral Daily   tamsulosin   0.4 mg Oral Daily   topiramate   50 mg Oral BID   Continuous Infusions:  heparin  1,000 Units/hr (09/15/23 1957)   PRN Meds: acetaminophen  **OR** acetaminophen , diphenhydrAMINE , HYDROcodone  bit-homatropine, hydrOXYzine , ipratropium-albuterol , lip balm, Muscle Rub, oxyCODONE , [EXPIRED] polyethylene glycol **FOLLOWED BY** polyethylene glycol, prochlorperazine , sodium chloride  flush   Vital Signs  Vitals:   09/15/23 2221 09/15/23 2327 09/16/23 0354 09/16/23 0403  BP:  (!) 90/56 (!) 93/59   Pulse: 82 75 73   Resp: 17 18 17    Temp:  98.4 F (36.9 C) 98.1 F (36.7 C)   TempSrc:  Oral Oral   SpO2: 97% 96% 93%   Weight:     80.8 kg  Height:        Intake/Output Summary (Last 24 hours) at 09/16/2023 0806 Last data filed at 09/16/2023 0548 Gross per 24 hour  Intake 555.72 ml  Output 2650 ml  Net -2094.28 ml      09/16/2023    4:03 AM 09/15/2023    5:00 AM 09/14/2023    5:37 AM  Last 3 Weights  Weight (lbs) 178 lb 2.1 oz 171 lb 8.3 oz 171 lb 1.2 oz  Weight (kg) 80.8 kg 77.8 kg 77.6 kg      Telemetry Normal sinus rhythm with rates in the 70s. - Personally Reviewed  ECG  No new ECG tracing today. - Personally Reviewed  Physical Exam  GEN: Obese Caucasian female. No acute distress.   Neck: No JVD. Cardiac: RRR. No murmurs, rubs, or gallops.  Respiratory: Clear to auscultation bilaterally. No wheezes, rhonchi, or rales. MS: Trace edema of right lower extremity edema. S/p left below knee amputation. Neuro:  No focal deficits. Psych: Normal affect. Responds appropriately.    Labs High Sensitivity Troponin:   Recent Labs  Lab 09/12/23 0921  TROPONINIHS 385*     Chemistry Recent Labs  Lab 09/14/23 1139 09/15/23 0830 09/16/23 0500  NA 136  137 139  K 4.2 4.3 4.2  CL 103 105 103  CO2 21* 23 22  GLUCOSE 184* 116* 82  BUN 30* 33* 31*  CREATININE 1.47* 1.26* 1.40*  CALCIUM  8.4* 8.6* 8.6*  MG 2.3 2.2 2.2  GFRNONAA 39* 47* 41*  ANIONGAP 12 9 14     Lipids No results for input(s): CHOL, TRIG, HDL, LABVLDL, LDLCALC, CHOLHDL in the last 168 hours.  Hematology Recent Labs  Lab 09/14/23 1139 09/15/23 0441 09/16/23 0500  WBC 12.8* 12.9* 15.3*  RBC 3.40* 3.47* 3.32*  HGB 9.6* 9.5* 9.3*  HCT 31.2* 31.6* 30.5*  MCV 91.8 91.1 91.9  MCH 28.2 27.4 28.0  MCHC 30.8 30.1 30.5  RDW 15.6* 15.7* 15.8*  PLT 441* 344 405*   Thyroid No results for input(s): TSH, FREET4 in the last 168 hours.  BNP Recent Labs  Lab 09/12/23 2053 09/15/23 0830  BNP 1,360.6* 964.5*    DDimer No results for input(s): DDIMER in the last 168 hours.   Radiology  No results found.  Cardiac  Studies  Echocardiogram 09/11/2023: Impressions: 1. No apical thrombus with Definity  contrast. Left ventricular ejection  fraction, by estimation, is 40 to 45%. The left ventricle has mildly  decreased function. The left ventricle demonstrates regional wall motion  abnormalities (see scoring  diagram/findings for description). Left ventricular diastolic parameters  are consistent with Grade I diastolic dysfunction (impaired relaxation).  There is severe hypokinesis of the left ventricular, entire inferior wall,  anterior segment, inferolateral  wall and lateral wall. The apical lateral wall is hyperdynamic.   2. Right ventricular systolic function is moderately reduced. The right  ventricular size is normal. There is normal pulmonary artery systolic  pressure. The estimated right ventricular systolic pressure is 30.2 mmHg.   3. The mitral valve is grossly normal. Trivial mitral valve  regurgitation.   4. The aortic valve was not well visualized. Aortic valve regurgitation  is not visualized. No aortic stenosis is present.   5. The inferior vena cava is dilated in size with <50% respiratory  variability, suggesting right atrial pressure of 15 mmHg.    Patient Profile   68 y.o. female with a history of PAD s/p left below knee amputation, hypertension, hyperlipidemia, type 2 diabetes mellitus, CVA in 05/2023, prior DVT, remote GI bleed in 1984 who was admitted on 09/09/2023 for a COPD exacerbation after presenting with cough and shortness of breath. Echo showed mildly reduced EF with wall motion abnormalities in LCX territory. Troponin was then checked and came back elevated. Therefore, Cardiology was consulted. She underwent LHC on 09/13/2023 which showed severe multivessel CAD. CT surgery was consulted.  Assessment & Plan   NSTEMI She reports some intermittent chest pain both at rest and with exertion prior to admission. High-sensitivity troponin 385. No delta was checked. Echo showed LVEF  of 40-45% with severe hypokinesis of the entire inferior wall, anterior segment, inferolateral wall, and lateral wall. LHC showed severe multivessel disease. CT surgery consulted.  - No current chest pain. - Continue IV Heparin . - Continue Aspirin  81mg  daily, Toprol -XL 25mg  daily, and Lipitor  80mg  daily.  - CT surgery has seen patient. She is considered high risk for CAGB given her comorbidities but they felt like she was still a potential candidate once pulmonary status improves. Pulmonology has seen patient and has provided perioperative recommendations.  Newly Diagnosed HFmrEF Echo this admission showed LVEF of 40-45% with wall motion abnormalities as described above and grade 1 diastolic dysfunction as well as moderately reduced RV  function. BNP 1,360 >> 964.  LVEDP on cath was normal at 12 mmHg. She has been diuresed with IV Bumex . Net negative 5.6 L this admission. Do not think today's documented weight is accurate. Creatinine has fluctuated some this admission but is up from 1.26 yesterday to 1.40 today.  - Euvolemic on exam. - Will hold IV Bumex  for now given normal LVEDP, euvolemic one exam, and bump in creatinine. Suspect we can switch to PO. Will review with MD. - Continue Losartan  25mg  daily. - Continue Toprol -XL 25mg  daily.  - Continue Spironolactone  25mg  daily. - No SGLT2 inhibitor given history of UTIs and possible need for CABG this admission. - Continue to monitor volume status closely.   PAD S/p left below knee amputation. - Continue aspirin  and statin.  - Followed by Vascular Surgery as an outpatient.   Hypertension BP soft but stable.  - Continue GDMT as above.  Hyperlipidemia LDL 114 in 05/2023 during admission for stroke.LDL goal <55. - Continue Lipitor  80mg  daily.  - Will repeat fasting lipid panel tomorrow morning.  CKD Stage III Baseline creatinine around 1.0. Creatinine has fluctuated some this admission but up from 1.26 yesterday to 1.40 today.  - Will hold  IV Bumex  for now.  Otherwise, per primary team: - COPD  - Persistent cough - Type 2 diabetes mellitus with neuropathy - Normocytic anemia: hemoglobin stable - GERD - CVA  - Chronic back pain  For questions or updates, please contact [facility] Please consult [email or web address] for contact info under     Signed, Callie E Goodrich, PA-C  09/16/2023, 8:06 AM    ADDENDUM:  MAP around 67 this morning and I left the room she reported some dizziness. Stopping IV Bumex  should help her BP soon. Also asked RN to hold morning BP medications (Toprol -XL and Spironolactone ).  Callie E Goodrich, PA-C 09/16/2023 11:48 AM  Personally seen and examined. Agree with above.  Severe peripheral arterial disease below-knee amputation diabetes with multivessel coronary artery disease, CTO of RCA, LAD long segment stenosis 90% -At this point considered to be high risk for CABG given comorbidities.  Pulmonology has seen patient and provided perioperative recommendations.  Dr. Kerrin wrote that she was high risk given her comorbidities for surgery but potentially a candidate once pulmonary status improved. - EF 40% reduced RV function LVEDP was 12 mmHg she has been diuresed with IV Bumex  and slightly hypotensive today so we will hold this.  We will also hold her Toprol  and spironolactone .  She at times tears up in discussion. Still wants to talk with other family members before making decision.   Oneil Parchment, MD

## 2023-09-16 NOTE — Progress Notes (Signed)
   Heart Failure Stewardship Pharmacist Progress Note   PCP: Cloria Annabella CROME, DO PCP-Cardiologist: None    HPI:  73 YOF with PMH of HTN, DM, COPD, CVA, depression, anxiety, s/p BKA. Recent self-reported COVID infection 2 1/2 weeks ago.  Presented to ED with cough and dyspnea. Received molnupiravir, symptoms initially improved then worsened, then given doxy. CXR showed no acute finding.  ECHO showed LVEF 40-45% (previously 60-65% 05/2023), with mildly decreased function, grade I diastolic dysfunction, severe hypokinesis. Right ventricle systolic function is moderately reduced.  L heart cath showed LAD Prox diffuse 70% stenosis, Mid 90% stenosis, Diag 2 with prox diffuse 80% stenosis. RCA Prox 100% occlusion with severe prox-mid calcification, left-to-right collaterals from LAD to RPDA. If not surgical candidate, medical management alone may be sufficient.  On exam, patient endorses some chest discomfort and SOB while moving. Dry cough still present. Patient states she feels tired. Some lower extremity swelling present.   Current HF Medications: Beta Blocker: metoprolol  succ 25 mg daily ACE/ARB/ARNI: losartan  25 mg daily MRA: spironolactone  25 mg daily  Prior to admission HF Medications: ACE/ARB/ARNI: lisinopril  5 mg daily  Pertinent Lab Values: Serum creatinine 1.40, BUN 31, Potassium 4.2, Sodium 139, BNP 964, Mg 2.2,  A1c 7.5 (05/2023)  Vital Signs: Weight: 178 lbs today. Admission weight: 160 lbs - incomplete - bed weights.  Blood pressure: 90-110/50-60's Heart rate: 76 I/O: net -2.18L yesterday; net -5.6L since admission  Medication Assistance / Insurance Benefits Check: Does the patient have prescription insurance?  Yes Type of insurance plan: [employer]   Outpatient Pharmacy:  Prior to admission outpatient pharmacy: [facility] [place], [place] - [address] Is the patient willing to use [facility] [place] TOC pharmacy at discharge? Pending Is the patient willing to  transition their outpatient pharmacy to utilize a [facility] outpatient pharmacy?   No   Assessment: 1. 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 NYHA class III symptoms. - Continue spironolactone  25 mg daily.  - Bumex  held today given normal LVEDP and bump in Scr. - Continue metoprolol  succ 25 mg daily - Continue losartan  25 mg daily - Daily weight. Strict I/O's - Keep K > 4, Mg > 2.    Plan: 1) Medication changes recommended at this time: - Continue to monitor BP and assess for symptoms of hypotension - Avoid SGLTi - history of chronic UTI  2) Patient assistance: - Has insurance with [employer]   3)  Education  - Initial education provided. - To be completed prior to discharge  Bernardino George, PharmD Candidate [employer] 09/16/2023 1:20 PM

## 2023-09-16 NOTE — Progress Notes (Signed)
   Notified by RN that patient was having chest pain. Went to bedside. Patient reports some substernal chest pain that started about 5 minutes prior to my arrival. It is clearly reproducible with palpation of that area and she states it is worse when she is taking a deep breath. Repeat EKG shows no acute ischemic changes. Symptoms sound consistent with musculoskeletal chest pain from significant cough. This does not sound like angina. Primary team is treating the cough. Can use Tylenol  if needed to help with pain. Asked patient to let us  know if she has any new or worsening chest pain. Updated RN.  Davarious Tumbleson E Kaulder Zahner, PA-C 09/16/2023 4:00 PM

## 2023-09-16 NOTE — TOC Progression Note (Addendum)
 Transition of Care [facility]) - Progression Note    Patient Details  Name: Anna [name] MRN: [id number] Date of Birth: 1955-05-15  Transition of [facility]) CM/SW Contact  Lauraine FORBES Saa, LCSW Phone Number: 09/16/2023, 1:26 PM  Clinical Narrative:     1:26 PM CSW introduced self and role to patient. Patient's granddaughter's, [place] and [place], were also present. Patient consented CSW to speak in front of granddaughter's. CSW informed patient and granddaughter's of physical therapy recommendation of patient discharging to SNF. Patient and patient granddaughter's were agreeable to patient discharging to SNF. CSW informed patient and patient's granddaughter's that patient's insurance are only in network with [facility], [facility], [facility], and [facility]. Patient expressed disinterest in returning to [facility]. CSW sent referrals to [facility], [facility], and [facility] SNFs. Patient's [facility] social worker is Alan [phone]).  3:58 PM Patient's PASRR remains pending additional clinical review.  Expected Discharge Plan: Skilled Nursing Facility Barriers to Discharge: Continued Medical Work up, SNF Pending bed offer  Expected Discharge Plan and Services In-house Referral: Clinical Social Work Discharge Planning Services: CM Consult Post Acute Care Choice: Skilled Nursing Facility Living arrangements for the past 2 months: Apartment                 DME Arranged: N/A DME Agency: NA       HH Arranged:  (see note)           Social Determinants of Health (SDOH) Interventions SDOH Screenings   Food Insecurity: No Food Insecurity (09/10/2023)  Housing: High Risk (09/10/2023)  Transportation Needs: No Transportation Needs (09/10/2023)  Utilities: At Risk (09/10/2023)  Social Connections: Socially Isolated (09/10/2023)  Tobacco Use: Low Risk  (09/10/2023)    Readmission Risk Interventions    09/11/2023   11:53 AM  Readmission Risk Prevention Plan  Transportation Screening  Complete  Medication Review (RN Care Manager) Referral to Pharmacy  PCP or Specialist appointment within 3-5 days of discharge Complete  HRI or Home Care Consult Complete

## 2023-09-16 NOTE — Progress Notes (Signed)
 Mobility Specialist Progress Note;   09/16/23 0952  Mobility  Activity Transferred from bed to chair  Level of Assistance Moderate assist, patient does 50-74%  Assistive Device Front wheel walker  Distance Ambulated (ft) 3 ft  Activity Response Tolerated well  Mobility Referral Yes  Mobility visit 1 Mobility  Mobility Specialist Start Time (ACUTE ONLY) [id number]  Mobility Specialist Stop Time (ACUTE ONLY) 1011  Mobility Specialist Time Calculation (min) (ACUTE ONLY) 19 min   Pt agreeable to mobility. Required MinG for bed mobility, ModA to stand and pivot from bed to chair. Pt c/o dizziness once seated in chair; BP 83/48 (59). MD present during this. Pt left in chair with all needs met, call bell in reach. MD in room and RN notified.   Lauraine Erm Mobility Specialist Please contact via SecureChat or [employer] [phone]

## 2023-09-16 NOTE — Progress Notes (Signed)
   09/16/23 2252  BiPAP/CPAP/SIPAP  BiPAP/CPAP/SIPAP Pt Type Adult  BiPAP/CPAP/SIPAP DREAMSTATIOND  Mask Type Full face mask  Dentures removed? Not applicable  Mask Size Small  EPAP 13 cmH2O  FiO2 (%) 21 %  Patient Home Machine No  Patient Home Mask No  Patient Home Tubing No  Auto Titrate No  BiPAP/CPAP /SiPAP Vitals  Pulse Rate 81  Resp 16  SpO2 100 %  MEWS Score/Color  MEWS Score 1  MEWS Score Color Green

## 2023-09-16 NOTE — Progress Notes (Signed)
 PHARMACY - ANTICOAGULATION CONSULT NOTE  Pharmacy Consult for heparin  Indication: multivessel CAD  Allergies  Allergen Reactions   Lasix [Furosemide] Shortness Of Breath and Rash   Bacitracin Other (See Comments)    Burns skin     Ciprofloxacin  Swelling   Citalopram Diarrhea   Doxycycline  Swelling and Other (See Comments)    burning all over body     Latex Itching   Morphine And Codeine  Hives   Duloxetine Other (See Comments)    Mental Status Changes (intolerance) saw pictures of gun   Levaquin [Levofloxacin In D5w] Other (See Comments)    Pt does not remember reaction but states she's had issues with med   Salvia Officinalis Other (See Comments)    Sage- sneezing    Soma [Carisoprodol] Other (See Comments)    Sleepy and constipation      Patient Measurements: Height: 5' 2 (157.5 cm) Weight: 80.8 kg (178 lb 2.1 oz) IBW/kg (Calculated) : 50.1 HEPARIN  DW (KG): 67.4  Vital Signs: Temp: 98.1 F (36.7 C) (06/23 0354) Temp Source: Oral (06/23 0354) BP: 93/59 (06/23 0354) Pulse Rate: 73 (06/23 0354)  Labs: Recent Labs    09/14/23 1139 09/15/23 0441 09/15/23 0830 09/15/23 1624 09/16/23 0500  HGB 9.6* 9.5*  --   --  9.3*  HCT 31.2* 31.6*  --   --  30.5*  PLT 441* 344  --   --  405*  HEPARINUNFRC 0.20* 0.81*  --  0.76* 0.47  CREATININE 1.47*  --  1.26*  --   --     Estimated Creatinine Clearance: 42.1 mL/min (A) (by C-G formula based on SCr of 1.26 mg/dL (H)).   Assessment: 60 yoF admitted with PNA found to have elevated troponins. Pharmacy consulted for IV heparin  dosing. Pt s/p cath 6/20 which shows multivessel CAD. TCTS consulted. Heparin  resumed while sorting out revascularization plans.  Heparin  level therapeutic at 0.47, CBC stable.  Goal of Therapy:  Heparin  level 0.3-0.7 units/ml Monitor platelets by anticoagulation protocol: Yes   Plan:  Continue heparin  1000 units/h Daily heparin  level and CBC  Ozell Jamaica, PharmD, BCPS,  [facility] Clinical Pharmacist [phone] Please check AMION for all [facility] Pharmacy numbers 09/16/2023

## 2023-09-16 NOTE — NC FL2 (Signed)
 [place]  MEDICAID FL2 LEVEL OF CARE FORM     IDENTIFICATION  Patient Name: Anna [name] Birthdate: 1955/12/01 Sex: female Admission Date (Current Location): 09/09/2023  [facility] Number:  Producer, television/film/video and Address:  [facility], [address], [place], [place] [address]      Provider Number: [id number]  Attending Physician Name and Address:  Kathrin Mignon DASEN, MD  Relative Name and Phone Number:  Cyncere Ruhe; Son; [phone]    Current Level of Care: Hospital Recommended Level of Care: Skilled Nursing Facility Prior Approval Number:    Date Approved/Denied:   PASRR Number:    Discharge Plan: SNF    Current Diagnoses: Patient Active Problem List   Diagnosis Date Noted   Atherosclerotic heart disease 09/14/2023   Acute HFrEF (heart failure with reduced ejection fraction) (HCC) 09/13/2023   Non-ST elevation (NSTEMI) myocardial infarction (HCC) 09/12/2023   Cardiomyopathy (HCC) 09/12/2023   HFrEF (heart failure with reduced ejection fraction) (HCC) 09/12/2023   Status post below-knee amputation of left lower extremity (HCC) 09/12/2023   COPD with acute exacerbation (HCC) 09/10/2023   CKD stage 3a, GFR 45-59 ml/min (HCC) 09/10/2023   COPD exacerbation (HCC) 09/10/2023   Fall at home, initial encounter 06/02/2023   Gangrene of toe of left foot (HCC)    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) 10/06/2021   Dyslipidemia 10/06/2021   Mood disorder (HCC) 10/06/2021   Cellulitis 08/19/2021   Hyperkalemia 08/19/2021   OSA (obstructive sleep apnea) 08/19/2021   Anxiety 08/04/2021   Hyperlipidemia 08/04/2021   Thrombocytosis 08/04/2021   Status post amputation of lesser toe of left foot (HCC) 06/21/2021   PAD (peripheral artery disease) (HCC) 06/12/2021   History of stroke 04/11/2021   Type 2 diabetes mellitus with diabetic peripheral angiopathy and gangrene, with long-term current use of insulin  (HCC)  04/03/2021   Left sided numbness 03/12/2021   Spinal stenosis of lumbar region with neurogenic claudication 03/18/2019   GERD (gastroesophageal reflux disease) 12/12/2017   History of adenomatous polyp of colon 01/14/2017   Dysphagia 09/12/2016   Screening for colon cancer 09/12/2016   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   Vision loss of left eye 12/09/2015   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 11/01/2015   Pyelonephritis 06/24/2014   Diabetic neuropathy (HCC)    Essential hypertension    Type 2 diabetes mellitus (HCC)    COPD mixed type (HCC)    Acute pyelonephritis 06/23/2014    Orientation RESPIRATION BLADDER Height & Weight     Self, Time, Situation, Place  Normal (Room Air) External catheter, Incontinent Weight: 178 lb 2.1 oz (80.8 kg) Height:  5' 2 (157.5 cm)  BEHAVIORAL SYMPTOMS/MOOD NEUROLOGICAL BOWEL NUTRITION STATUS      Continent Diet (Please see discharge summary)  AMBULATORY STATUS COMMUNICATION OF NEEDS Skin   Extensive Assist Verbally Normal                       Personal Care Assistance Level of Assistance  Bathing, Feeding, Dressing Bathing Assistance: Maximum assistance Feeding assistance: Maximum assistance Dressing Assistance: Maximum assistance     Functional Limitations Info  Sight Sight Info: Impaired (R and L (Eyeglasses))        SPECIAL CARE FACTORS FREQUENCY  PT (By licensed PT), OT (By licensed OT)     PT Frequency: 5x OT Frequency: 5x  Contractures Contractures Info: Not present    Additional Factors Info  Code Status, Allergies, Psychotropic, Insulin  Sliding Scale Code Status Info: Full Code Allergies Info: Lasix (Furosemide), Bacitracin, Ciprofloxacin , Citalopram, Doxycycline , Latex, Morphine And Codeine , Duloxetine, Levaquin (Levofloxacin In D5w), Salvia Officinalis, Soma (Carisoprodol) Psychotropic Info: Please see discharge summary Insulin  Sliding Scale  Info: Please see discharge summary       Current Medications (09/16/2023):  This is the current hospital active medication list Current Facility-Administered Medications  Medication Dose Route Frequency Provider Last Rate Last Admin   acetaminophen  (TYLENOL ) tablet 650 mg  650 mg Oral Q6H PRN Opyd, Timothy S, MD   650 mg at 09/16/23 0355   Or   acetaminophen  (TYLENOL ) suppository 650 mg  650 mg Rectal Q6H PRN Opyd, Timothy S, MD       arformoterol  (BROVANA ) nebulizer solution 15 mcg  15 mcg Nebulization BID Gonfa, Taye T, MD   15 mcg at 09/16/23 0825   aspirin  EC tablet 81 mg  81 mg Oral Daily Opyd, Timothy S, MD   81 mg at 09/16/23 1239   atorvastatin  (LIPITOR ) tablet 80 mg  80 mg Oral QHS Opyd, Timothy S, MD   80 mg at 09/15/23 2133   benzonatate  (TESSALON ) capsule 100 mg  100 mg Oral TID Patel, Pranav M, MD   100 mg at 09/16/23 1239   budesonide  (PULMICORT ) nebulizer solution 0.5 mg  0.5 mg Nebulization BID Gonfa, Taye T, MD   0.5 mg at 09/16/23 0825   busPIRone  (BUSPAR ) tablet 7.5 mg  7.5 mg Oral BID Opyd, Timothy S, MD   7.5 mg at 09/16/23 1239   dextromethorphan  (DELSYM ) 30 MG/5ML liquid 30 mg  30 mg Oral BID Patel, Pranav M, MD   30 mg at 09/16/23 1228   diphenhydrAMINE  (BENADRYL ) injection 25 mg  25 mg Intravenous Q8H PRN Krishnan, Gokul, MD       ferrous sulfate  tablet 325 mg  325 mg Oral Q breakfast Patel, Pranav M, MD   325 mg at 09/16/23 9189   FLUoxetine  (PROZAC ) capsule 40 mg  40 mg Oral Daily Opyd, Timothy S, MD   40 mg at 09/16/23 1239   gabapentin  (NEURONTIN ) capsule 400 mg  400 mg Oral TID Opyd, Timothy S, MD   400 mg at 09/16/23 1239   guaiFENesin  (MUCINEX ) 12 hr tablet 600 mg  600 mg Oral BID Patel, Pranav M, MD   600 mg at 09/16/23 1239   heparin  ADULT infusion 100 units/mL (25000 units/250mL)  1,000 Units/hr Intravenous Continuous Billy Rocky SAUNDERS, RPH 10 mL/hr at 09/15/23 1957 1,000 Units/hr at 09/15/23 1957   HYDROcodone  bit-homatropine (HYCODAN) 5-1.5 MG/5ML syrup 5  mL  5 mL Oral Q6H PRN Patel, Pranav M, MD   5 mL at 09/16/23 0809   hydrOXYzine  (ATARAX ) tablet 10 mg  10 mg Oral TID PRN Opyd, Timothy S, MD   10 mg at 09/12/23 2138   insulin  aspart (novoLOG ) injection 0-20 Units  0-20 Units Subcutaneous TID WC Patel, Pranav M, MD   3 Units at 09/16/23 1334   insulin  aspart (novoLOG ) injection 0-5 Units  0-5 Units Subcutaneous QHS Patel, Pranav M, MD   2 Units at 09/13/23 2134   insulin  aspart (novoLOG ) injection 3 Units  3 Units Subcutaneous TID WC Gonfa, Taye T, MD   3 Units at 09/16/23 1334   insulin  glargine-yfgn (SEMGLEE ) injection 15 Units  15 Units Subcutaneous Daily Gonfa, Taye T, MD   15 Units at 09/16/23 1227   ipratropium-albuterol  (  DUONEB) 0.5-2.5 (3) MG/3ML nebulizer solution 3 mL  3 mL Nebulization Q2H PRN Opyd, Evalene RAMAN, MD   3 mL at 09/15/23 1759   lip balm (CARMEX) ointment   Topical PRN Gonfa, Taye T, MD       loratadine  (CLARITIN ) tablet 10 mg  10 mg Oral Daily Krishnan, Gokul, MD   10 mg at 09/16/23 1239   losartan  (COZAAR ) tablet 25 mg  25 mg Oral QPM Tolia, Sunit, DO   25 mg at 09/15/23 1825   metoprolol  succinate (TOPROL -XL) 24 hr tablet 25 mg  25 mg Oral Daily Tolia, Sunit, DO   25 mg at 09/15/23 1120   Muscle Rub CREA   Topical PRN Krishnan, Gokul, MD   Given at 09/11/23 1644   nystatin  (MYCOSTATIN /NYSTOP ) topical powder   Topical BID Patel, Pranav M, MD   Given at 09/16/23 1241   oxyCODONE  (Oxy IR/ROXICODONE ) immediate release tablet 5-10 mg  5-10 mg Oral Q4H PRN Opyd, Timothy S, MD   10 mg at 09/14/23 0406   pantoprazole  (PROTONIX ) EC tablet 40 mg  40 mg Oral BID Opyd, Timothy S, MD   40 mg at 09/16/23 1241   polyethylene glycol (MIRALAX  / GLYCOLAX ) packet 17 g  17 g Oral BID PRN Gonfa, Taye T, MD       prochlorperazine  (COMPAZINE ) injection 5 mg  5 mg Intravenous Q6H PRN Opyd, Timothy S, MD   5 mg at 09/10/23 2034   revefenacin (YUPELRI) nebulizer solution 175 mcg  175 mcg Nebulization Daily Gonfa, Taye T, MD   175 mcg at 09/16/23 9175    rOPINIRole  (REQUIP ) tablet 0.25 mg  0.25 mg Oral QHS Opyd, Timothy S, MD   0.25 mg at 09/15/23 2134   senna-docusate (Senokot-S) tablet 2 tablet  2 tablet Oral BID Krishnan, Gokul, MD   2 tablet at 09/16/23 1239   sodium chloride  flush (NS) 0.9 % injection 10-40 mL  10-40 mL Intracatheter PRN Krishnan, Gokul, MD       spironolactone  (ALDACTONE ) tablet 25 mg  25 mg Oral Daily Tolia, Sunit, DO   25 mg at 09/16/23 1239   tamsulosin  (FLOMAX ) capsule 0.4 mg  0.4 mg Oral Daily Opyd, Timothy S, MD   0.4 mg at 09/16/23 1239   topiramate  (TOPAMAX ) tablet 50 mg  50 mg Oral BID Opyd, Timothy S, MD   50 mg at 09/16/23 1239     Discharge Medications: Please see discharge summary for a list of discharge medications.  Relevant Imaging Results:  Relevant Lab Results:   Additional Information SS#: [id number]  Lauraine FORBES Saa, LCSW

## 2023-09-16 NOTE — Progress Notes (Signed)
 PROGRESS NOTE  Anna [name] FMW:[id number] DOB: 1955/08/01   PCP: Cloria Annabella CROME, DO  Patient is from: Home.  DOA: 09/09/2023 LOS: 6  Chief complaints Chief Complaint  Patient presents with   Cough     Brief Narrative / Interim history: 68 year old F with PMH of COPD, CVA, IDDM-2, left BKA, HTN, anxiety, depression and reported COVID-19 infection about 2 and half weeks prior for which she was treated with Paxlovid  presents with shortness of breath and cough, and admitted with COPD exacerbation.  In ED, she was slightly tachycardic and tachypneic. Cr 1.19.  WBC 11.4.  Hgb 9.8.  Platelet 484.  UA with large LE.  CXR without acute finding.  A 20 pathogen RVP nonreactive.  Patient was started on Solu-Medrol , ceftriaxone  and nebulizers and admitted.  CT angio chest negative for PE but scattered basilar predominant interstitial and groundglass opacities compatible with given history of recent COVID-19 pneumonia, borderline enlarged mediastinal adenopathy likely reactive and nonspecific mild periportal edema.  TTE with LVEF of 40 to 45%, RWMA, G1 DD and RVSP of 30 mmHg.  BNP elevated to 1300.  Troponin elevated to 385.  Cardiology consulted.  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  CTS consulted.  Plan for CABG once pulmonary status improves.  COVID-19 swab nonreactive.  Respiratory symptoms improving.  Subjective: Seen and examined earlier this morning.  No major events overnight of this morning.  She reports some dizziness when she got out of the bed to bedside chair.  Hypotensive.  Objective: Vitals:   09/16/23 0403 09/16/23 0804 09/16/23 0825 09/16/23 1232  BP:  (!) 103/49  105/67  Pulse:  75 76   Resp:  19 19 19   Temp:  97.9 F (36.6 C)  97.7 F (36.5 C)  TempSrc:  Oral  Oral  SpO2:  94% 100% 99%  Weight: 80.8 kg     Height:        Examination:  GENERAL: No apparent distress.  Nontoxic. HEENT: MMM.  Vision and hearing grossly intact.  NECK: Supple.  No apparent JVD.   RESP: On RA.  No IWOB.  Rhonchi.  CVS:  RRR. Heart sounds normal.  ABD/GI/GU: BS+. Abd soft, NTND.  MSK/EXT:  Moves extremities.  Left BKA.  Trace RLE edema. SKIN: no apparent skin lesion or wound NEURO: AA.  Oriented appropriately.  No apparent focal neuro deficit. PSYCH: Calm. Normal affect.   Consultants:  Cardiology Cardiothoracic surgery Pulmonology  Procedures: 6/20-LHC with multivessel CAD  Microbiology summarized: Urine culture with multiple species A-20 pathogen RVP nonreactive COVID-19 PCR nonreactive  Assessment and plan: Persistent cough: Multifactorial including asthma exacerbation, tracheomalacia, GERD and postviral from recent COVID-19 infection.  CXR negative.  Pro-Cal negative.  CRP less than 0.5.  ESR elevated.  Repeat COVID-19 PCR nonreactive.  CT angio chest as above.  No oxygen requirement.  Cough has improved.  She has no significant respiratory distress. -Completed 5 days of Solu-Medrol  on 6/21. -Continue Brovana , Pulmicort  and Yupelri -Continue DuoNebs as needed -Continue Protonix  40 mg twice daily -Advised to avoid GERD triggers. -Mucolytic's and antitussive .  Acute HFmrEF: TTE with LVEF of 40 to 45%, RWMA and RVSP of 30 mmHg.  No acute ischemic changes on EKG.  BNP elevated to 1300.  Troponin elevated 385.  Allergic to furosemide.  LHC as below.  Hypotensive this morning. -Appreciate help by cardiology-discontinued Bumex . -Notified cardiology about hypotension and they recommended holding cardiac meds. -Strict intake and output, daily weight, renal functions and electrolytes  Non-STEMI: Reportedly had  exertional chest pain going on for weeks.  Troponin elevated.  EKG without acute ischemic finding.  TTE as above.  LHC with multivessel CAD. -Continue IV heparin , Lipitor  and aspirin  -CTS consulted for CABG evaluation.  Pulmonary provided perioperative recommendations.   Recent COVID-19 infection: Treated with Paxlovid  outpatient 3 weeks ago.  Rapid  COVID-19 swab negative -Discontinue isolation precaution  IDDM-2 with hyperglycemia, neuropathy: Hyperglycemia likely due to steroid.  A1c 5.7%. Recent Labs  Lab 09/15/23 1130 09/15/23 1658 09/15/23 2105 09/16/23 0807 09/16/23 1234  GLUCAP 173* 173* 110* 82 141*  -Continue SSI-resistant - Continue NovoLog  3 units 3 times daily with meals - Continue Semglee  15 units daily. -Continue gabapentin  and Lipitor  -Further adjustment as appropriate  AKI on CKD-3A: b/l Cr ~1.1.  Likely from hypotensive episode, LHC, losartan  and Aldactone .  Recent Labs    06/02/23 1350 08/08/23 1217 09/09/23 1931 09/10/23 0528 09/11/23 0620 09/12/23 0335 09/13/23 0408 09/14/23 1139 09/15/23 0830 09/16/23 0500  BUN 11 10 13 14 15 22  24* 30* 33* 31*  CREATININE 1.02* 1.09* 1.19* 1.24* 1.05* 1.12* 1.14* 1.47* 1.26* 1.40*  - Continue monitoring - Avoid hypotension.  Normocytic anemia: Stable -Continue monitoring   Generalized weakness/left BKA -PT/OT-recommended SNF  Concern for yeast infection -Antibiotics discontinued. -Nystatin .  Constipation -Bowel regimen after LHC  Mood disorder/chronic pain - Continue home meds  Body mass index is 32.58 kg/m.           DVT prophylaxis:    Code Status: Full code Family Communication: Updated patient's multiple family members at bedside. Level of care: Progressive Cardiac Status is: Inpatient Remains inpatient appropriate because: Non-STEMI, multivessel CAD, acute CHF   Final disposition: SNF   55 minutes with more than 50% spent in reviewing records, counseling patient/family and coordinating care.   Sch Meds:  Scheduled Meds:  arformoterol   15 mcg Nebulization BID   aspirin  EC  81 mg Oral Daily   atorvastatin   80 mg Oral QHS   benzonatate   100 mg Oral TID   budesonide  (PULMICORT ) nebulizer solution  0.5 mg Nebulization BID   busPIRone   7.5 mg Oral BID   dextromethorphan   30 mg Oral BID   ferrous sulfate   325 mg Oral Q  breakfast   FLUoxetine   40 mg Oral Daily   gabapentin   400 mg Oral TID   guaiFENesin   600 mg Oral BID   insulin  aspart  0-20 Units Subcutaneous TID WC   insulin  aspart  0-5 Units Subcutaneous QHS   insulin  aspart  3 Units Subcutaneous TID WC   insulin  glargine-yfgn  15 Units Subcutaneous Daily   loratadine   10 mg Oral Daily   losartan   25 mg Oral QPM   metoprolol  succinate  25 mg Oral Daily   nystatin    Topical BID   pantoprazole   40 mg Oral BID   revefenacin  175 mcg Nebulization Daily   rOPINIRole   0.25 mg Oral QHS   senna-docusate  2 tablet Oral BID   spironolactone   25 mg Oral Daily   tamsulosin   0.4 mg Oral Daily   topiramate   50 mg Oral BID   Continuous Infusions:  heparin  1,000 Units/hr (09/15/23 1957)   PRN Meds:.acetaminophen  **OR** acetaminophen , diphenhydrAMINE , HYDROcodone  bit-homatropine, hydrOXYzine , ipratropium-albuterol , lip balm, Muscle Rub, oxyCODONE , [EXPIRED] polyethylene glycol **FOLLOWED BY** polyethylene glycol, prochlorperazine , sodium chloride  flush  Antimicrobials: Anti-infectives (From admission, onward)    Start     Dose/Rate Route Frequency Ordered Stop   09/10/23 1700  fluconazole  (DIFLUCAN ) tablet 150 mg  150 mg Oral  Once 09/10/23 1603 09/10/23 1736   09/10/23 0130  cefTRIAXone  (ROCEPHIN ) 1 g in sodium chloride  0.9 % 100 mL IVPB  Status:  Discontinued        1 g 200 mL/hr over 30 Minutes Intravenous Daily at bedtime 09/10/23 0115 09/13/23 1424        I have personally reviewed the following labs and images: CBC: Recent Labs  Lab 09/09/23 1931 09/10/23 0528 09/12/23 0335 09/13/23 0408 09/14/23 1139 09/15/23 0441 09/16/23 0500  WBC 11.4*   < > 11.6* 12.9* 12.8* 12.9* 15.3*  NEUTROABS 6.3  --   --   --   --   --   --   HGB 9.8*   < > 8.9* 9.0* 9.6* 9.5* 9.3*  HCT 33.3*   < > 29.9* 29.7* 31.2* 31.6* 30.5*  MCV 95.4   < > 94.0 94.0 91.8 91.1 91.9  PLT 489*   < > 395 397 441* 344 405*   < > = values in this interval not  displayed.   BMP &GFR Recent Labs  Lab 09/12/23 0335 09/13/23 0408 09/14/23 1139 09/15/23 0830 09/16/23 0500  NA 134* 138 136 137 139  K 4.1 3.9 4.2 4.3 4.2  CL 105 108 103 105 103  CO2 21* 20* 21* 23 22  GLUCOSE 229* 136* 184* 116* 82  BUN 22 24* 30* 33* 31*  CREATININE 1.12* 1.14* 1.47* 1.26* 1.40*  CALCIUM  8.7* 8.8* 8.4* 8.6* 8.6*  MG  --   --  2.3 2.2 2.2   Estimated Creatinine Clearance: 37.9 mL/min (A) (by C-G formula based on SCr of 1.4 mg/dL (H)). Liver & Pancreas: No results for input(s): AST, ALT, ALKPHOS, BILITOT, PROT, ALBUMIN in the last 168 hours. No results for input(s): LIPASE, AMYLASE in the last 168 hours. No results for input(s): AMMONIA in the last 168 hours. Diabetic: No results for input(s): HGBA1C in the last 72 hours. Recent Labs  Lab 09/15/23 1130 09/15/23 1658 09/15/23 2105 09/16/23 0807 09/16/23 1234  GLUCAP 173* 173* 110* 82 141*   Cardiac Enzymes: No results for input(s): CKTOTAL, CKMB, CKMBINDEX, TROPONINI in the last 168 hours. No results for input(s): PROBNP in the last 8760 hours. Coagulation Profile: No results for input(s): INR, PROTIME in the last 168 hours. Thyroid Function Tests: No results for input(s): TSH, T4TOTAL, FREET4, T3FREE, THYROIDAB in the last 72 hours. Lipid Profile: No results for input(s): CHOL, HDL, LDLCALC, TRIG, CHOLHDL, LDLDIRECT in the last 72 hours. Anemia Panel: No results for input(s): VITAMINB12, FOLATE, FERRITIN, TIBC, IRON, RETICCTPCT in the last 72 hours.  Urine analysis:    Component Value Date/Time   COLORURINE YELLOW 09/09/2023 2336   APPEARANCEUR HAZY (A) 09/09/2023 2336   LABSPEC 1.008 09/09/2023 2336   PHURINE 5.0 09/09/2023 2336   GLUCOSEU NEGATIVE 09/09/2023 2336   HGBUR MODERATE (A) 09/09/2023 2336   BILIRUBINUR NEGATIVE 09/09/2023 2336   KETONESUR NEGATIVE 09/09/2023 2336   PROTEINUR NEGATIVE 09/09/2023 2336    UROBILINOGEN 1.0 06/23/2014 1950   NITRITE NEGATIVE 09/09/2023 2336   LEUKOCYTESUR LARGE (A) 09/09/2023 2336   Sepsis Labs: Invalid input(s): PROCALCITONIN, LACTICIDVEN  Microbiology: Recent Results (from the past 240 hours)  Urine Culture     Status: Abnormal   Collection Time: 09/09/23 11:36 PM   Specimen: Urine, Random  Result Value Ref Range Status   Specimen Description URINE, RANDOM  Final   Special Requests   Final    NONE Reflexed from [id number] Performed at [facility]  Hospital Lab, [address], [place], [place] [address]    Culture MULTIPLE SPECIES PRESENT, SUGGEST RECOLLECTION (A)  Final   Report Status 09/10/2023 FINAL  Final  Respiratory (~20 pathogens) panel by PCR     Status: None   Collection Time: 09/10/23  8:41 AM   Specimen: Anterior Nasal Swab; Respiratory  Result Value Ref Range Status   Adenovirus NOT DETECTED NOT DETECTED Final   Coronavirus 229E NOT DETECTED NOT DETECTED Final    Comment: (NOTE) The Coronavirus on the Respiratory Panel, DOES NOT test for the novel  Coronavirus (2019 nCoV)    Coronavirus HKU1 NOT DETECTED NOT DETECTED Final   Coronavirus NL63 NOT DETECTED NOT DETECTED Final   Coronavirus OC43 NOT DETECTED NOT DETECTED Final   Metapneumovirus NOT DETECTED NOT DETECTED Final   Rhinovirus / Enterovirus NOT DETECTED NOT DETECTED Final   Influenza A NOT DETECTED NOT DETECTED Final   Influenza B NOT DETECTED NOT DETECTED Final   Parainfluenza Virus 1 NOT DETECTED NOT DETECTED Final   Parainfluenza Virus 2 NOT DETECTED NOT DETECTED Final   Parainfluenza Virus 3 NOT DETECTED NOT DETECTED Final   Parainfluenza Virus 4 NOT DETECTED NOT DETECTED Final   Respiratory Syncytial Virus NOT DETECTED NOT DETECTED Final   Bordetella pertussis NOT DETECTED NOT DETECTED Final   Bordetella Parapertussis NOT DETECTED NOT DETECTED Final   Chlamydophila pneumoniae NOT DETECTED NOT DETECTED Final   Mycoplasma pneumoniae NOT DETECTED NOT DETECTED Final     Comment: Performed at [facility], [address], [place], [place] [address]  SARS Coronavirus 2 by RT PCR (hospital order, performed in [facility] hospital lab) *cepheid single result test* Anterior Nasal Swab     Status: None   Collection Time: 09/13/23  8:08 AM   Specimen: Anterior Nasal Swab  Result Value Ref Range Status   SARS Coronavirus 2 by RT PCR NEGATIVE NEGATIVE Final    Comment: Performed at [facility] Lab, [address], [place], [place] [address]    Radiology Studies: VAS US  LOWER EXTREMITY SAPHENOUS VEIN MAPPING Result Date: 09/16/2023 LOWER EXTREMITY VEIN MAPPING Patient Name:  Anna [name]  Date of Exam:   09/16/2023 Medical Rec #: [id number]         Accession #:    [id number] Date of Birth: Apr 03, 1955        Patient Gender: F Patient Age:   27 years Exam Location:  [facility] Procedure:      VAS US  LOWER EXTREMITY SAPHENOUS VEIN MAPPING Referring Phys: KYLA DONALD --------------------------------------------------------------------------------  Indications:  Pre-op Risk Factors: Hypertension, hyperlipidemia, Diabetes.  Performing Technologist: Elmarie Lindau, RVT  Examination Guidelines: A complete evaluation includes B-mode imaging, spectral Doppler, color Doppler, and power Doppler as needed of all accessible portions of each vessel. Bilateral testing is considered an integral part of a complete examination. Limited examinations for reoccurring indications may be performed as noted. +----------------+-----------+--------------+----------------+-----------+ RT Diameter (cm)RT Findings     GSV      LT Diameter (cm)LT Findings +----------------+-----------+--------------+----------------+-----------+       0.58                 Proximal thigh      0.43                  +----------------+-----------+--------------+----------------+-----------+       0.17       branching   Mid thigh                                +----------------+-----------+--------------+----------------+-----------+  0.15                  Distal thigh                             +----------------+-----------+--------------+----------------+-----------+       0.14                      Knee                                 +----------------+-----------+--------------+----------------+-----------+       0.16                   Prox calf                               +----------------+-----------+--------------+----------------+-----------+ Right Tech Comments: Patent right GSV with normal compression and no evidence of thrombus. The GSV is not seen distal to the prox calf area. Left Tech Comments: Lt BKA Summary:  Right: Patent right great saphenous vein with no evidence of        thrombus.        The right great saphenous vein measures 0.14 to 0.58 cm.  *See table(s) above for measurements and observations. Diagnosing physician: Lonni Gaskins MD Electronically signed by Lonni Gaskins MD on 09/16/2023 at 2:47:09 PM.    Final    VAS US  CAROTID Result Date: 09/16/2023 Carotid Arterial Duplex Study Patient Name:  Anna [name]  Date of Exam:   09/16/2023 Medical Rec #: [id number]         Accession #:    [id number] Date of Birth: Apr 03, 1955        Patient Gender: F Patient Age:   67 years Exam Location:  [facility] Procedure:      VAS US  CAROTID Referring Phys: KYLA DONALD --------------------------------------------------------------------------------  Indications:   Bilateral bruits. Risk Factors:  Hypertension, hyperlipidemia, Diabetes, prior CVA. Other Factors: Hx of bilat 1-39% ICA stenosis. Performing Technologist: Elmarie Lindau, RVT  Examination Guidelines: A complete evaluation includes B-mode imaging, spectral Doppler, color Doppler, and power Doppler as needed of all accessible portions of each vessel. Bilateral testing is considered an integral part of a complete examination. Limited examinations for  reoccurring indications may be performed as noted.  Right Carotid Findings: +----------+--------+--------+--------+------------------+------------------+           PSV cm/sEDV cm/sStenosisPlaque DescriptionComments           +----------+--------+--------+--------+------------------+------------------+ CCA Prox  59      12                                                   +----------+--------+--------+--------+------------------+------------------+ CCA Distal41      10              heterogenous                         +----------+--------+--------+--------+------------------+------------------+ ICA Prox  82      27  intimal thickening +----------+--------+--------+--------+------------------+------------------+ ICA Distal77      22                                                   +----------+--------+--------+--------+------------------+------------------+ ECA       50                                                           +----------+--------+--------+--------+------------------+------------------+ +----------+--------+-------+----------------+-------------------+           PSV cm/sEDV cmsDescribe        Arm Pressure (mmHG) +----------+--------+-------+----------------+-------------------+ [id number]            Multiphasic, WNL                    +----------+--------+-------+----------------+-------------------+ +---------+--------+--+--------+--+---------+ VertebralPSV cm/s38EDV cm/s12Antegrade +---------+--------+--+--------+--+---------+  Left Carotid Findings: +----------+--------+--------+--------+------------------+--------+           PSV cm/sEDV cm/sStenosisPlaque DescriptionComments +----------+--------+--------+--------+------------------+--------+ CCA Prox  72      20                                         +----------+--------+--------+--------+------------------+--------+ CCA Distal72       19              heterogenous               +----------+--------+--------+--------+------------------+--------+ ICA Prox  66      17              heterogenous               +----------+--------+--------+--------+------------------+--------+ ICA Distal103     31                                         +----------+--------+--------+--------+------------------+--------+ ECA       124                                                +----------+--------+--------+--------+------------------+--------+ +----------+--------+--------+----------------+-------------------+           PSV cm/sEDV cm/sDescribe        Arm Pressure (mmHG) +----------+--------+--------+----------------+-------------------+ [id number]              Multiphasic, WNL                    +----------+--------+--------+----------------+-------------------+ +---------+--------+--+--------+-+---------+ VertebralPSV cm/s34EDV cm/s8Antegrade +---------+--------+--+--------+-+---------+   Summary: Right Carotid: Velocities in the right ICA are consistent with a 1-39% stenosis. Left Carotid: Velocities in the left ICA are consistent with a 1-39% stenosis. Vertebrals:  Bilateral vertebral arteries demonstrate antegrade flow. Subclavians: Normal flow hemodynamics were seen in bilateral subclavian              arteries. *See table(s) above for measurements and observations.  Electronically signed by Lonni Gaskins MD on 09/16/2023 at 2:46:52 PM.    Final  Daryon Remmert T[facility]  If 7PM-7AM, please contact night-coverage [email or web address] 09/16/2023, 4:23 PM

## 2023-09-16 NOTE — TOC CM/SW Note (Signed)
 RE: Anna [name] Date of Birth: 08/16/1955 Date: 09/16/2023  Please be advised that the above-named patient will require a short-term nursing home stay - anticipated 30 days or less for rehabilitation and strengthening.  The plan is for return home.  Lauraine Saa, MSW, LCSW-A Transitions of Care  Clinical Social Worker I [phone]

## 2023-09-17 DIAGNOSIS — I2582 Chronic total occlusion of coronary artery: Secondary | ICD-10-CM

## 2023-09-17 DIAGNOSIS — F419 Anxiety disorder, unspecified: Secondary | ICD-10-CM

## 2023-09-17 DIAGNOSIS — I2510 Atherosclerotic heart disease of native coronary artery without angina pectoris: Secondary | ICD-10-CM

## 2023-09-17 DIAGNOSIS — Z9889 Other specified postprocedural states: Secondary | ICD-10-CM

## 2023-09-17 DIAGNOSIS — J449 Chronic obstructive pulmonary disease, unspecified: Secondary | ICD-10-CM

## 2023-09-17 DIAGNOSIS — E785 Hyperlipidemia, unspecified: Secondary | ICD-10-CM

## 2023-09-17 DIAGNOSIS — Z89512 Acquired absence of left leg below knee: Secondary | ICD-10-CM

## 2023-09-17 DIAGNOSIS — J441 Chronic obstructive pulmonary disease with (acute) exacerbation: Secondary | ICD-10-CM

## 2023-09-17 DIAGNOSIS — Z862 Personal history of diseases of the blood and blood-forming organs and certain disorders involving the immune mechanism: Secondary | ICD-10-CM

## 2023-09-17 DIAGNOSIS — I252 Old myocardial infarction: Secondary | ICD-10-CM

## 2023-09-17 DIAGNOSIS — N1831 Chronic kidney disease, stage 3a: Secondary | ICD-10-CM

## 2023-09-17 DIAGNOSIS — I129 Hypertensive chronic kidney disease with stage 1 through stage 4 chronic kidney disease, or unspecified chronic kidney disease: Secondary | ICD-10-CM

## 2023-09-17 DIAGNOSIS — I739 Peripheral vascular disease, unspecified: Secondary | ICD-10-CM

## 2023-09-17 DIAGNOSIS — Z8673 Personal history of transient ischemic attack (TIA), and cerebral infarction without residual deficits: Secondary | ICD-10-CM

## 2023-09-17 DIAGNOSIS — F32A Depression, unspecified: Secondary | ICD-10-CM

## 2023-09-17 DIAGNOSIS — J90 Pleural effusion, not elsewhere classified: Secondary | ICD-10-CM

## 2023-09-17 DIAGNOSIS — E669 Obesity, unspecified: Secondary | ICD-10-CM

## 2023-09-17 DIAGNOSIS — Z794 Long term (current) use of insulin: Secondary | ICD-10-CM

## 2023-09-17 DIAGNOSIS — G4733 Obstructive sleep apnea (adult) (pediatric): Secondary | ICD-10-CM

## 2023-09-17 DIAGNOSIS — E1159 Type 2 diabetes mellitus with other circulatory complications: Secondary | ICD-10-CM

## 2023-09-17 DIAGNOSIS — E1151 Type 2 diabetes mellitus with diabetic peripheral angiopathy without gangrene: Secondary | ICD-10-CM

## 2023-09-17 LAB — BASIC METABOLIC PANEL WITH GFR
Anion gap: 6 (ref 5–15)
BUN: 24 mg/dL — ABNORMAL HIGH (ref 8–23)
CO2: 24 mmol/L (ref 22–32)
Calcium: 8.1 mg/dL — ABNORMAL LOW (ref 8.9–10.3)
Chloride: 104 mmol/L (ref 98–111)
Creatinine, Ser: 1.29 mg/dL — ABNORMAL HIGH (ref 0.44–1.00)
GFR, Estimated: 45 mL/min — ABNORMAL LOW (ref >60)
Glucose, Bld: 90 mg/dL (ref 70–99)
Potassium: 4.2 mmol/L (ref 3.5–5.1)
Sodium: 134 mmol/L — ABNORMAL LOW (ref 135–145)

## 2023-09-17 LAB — MAGNESIUM: Magnesium: 2.2 mg/dL (ref 1.7–2.4)

## 2023-09-17 LAB — CBC
HCT: 31.5 % — ABNORMAL LOW (ref 36.0–46.0)
Hemoglobin: 9.7 g/dL — ABNORMAL LOW (ref 12.0–15.0)
MCH: 27.8 pg (ref 26.0–34.0)
MCHC: 30.8 g/dL (ref 30.0–36.0)
MCV: 90.3 fL (ref 80.0–100.0)
Platelets: 404 10*3/uL — ABNORMAL HIGH (ref 150–400)
RBC: 3.49 MIL/uL — ABNORMAL LOW (ref 3.87–5.11)
RDW: 15.7 % — ABNORMAL HIGH (ref 11.5–15.5)
WBC: 12.9 10*3/uL — ABNORMAL HIGH (ref 4.0–10.5)
nRBC: 0 % (ref 0.0–0.2)

## 2023-09-17 LAB — HEPARIN LEVEL (UNFRACTIONATED)
Heparin Unfractionated: 0.24 [IU]/mL — ABNORMAL LOW (ref 0.30–0.70)
Heparin Unfractionated: 0.53 [IU]/mL (ref 0.30–0.70)

## 2023-09-17 LAB — GLUCOSE, CAPILLARY
Glucose-Capillary: 91 mg/dL (ref 70–99)
Glucose-Capillary: 103 mg/dL — ABNORMAL HIGH (ref 70–99)
Glucose-Capillary: 171 mg/dL — ABNORMAL HIGH (ref 70–99)
Glucose-Capillary: 70 mg/dL (ref 70–99)

## 2023-09-17 MED ORDER — ORAL CARE MOUTH RINSE: 15.0000 mL | OROMUCOSAL | Status: DC | PRN

## 2023-09-17 MED ORDER — ASPIRIN 81 MG PO CHEW: 81.0000 mg | CHEWABLE_TABLET | ORAL | Status: DC

## 2023-09-17 MED ORDER — SODIUM CHLORIDE 0.9 % WEIGHT BASED INFUSION: 1.0000 mL/kg/h | INTRAVENOUS | Status: DC

## 2023-09-17 NOTE — Progress Notes (Signed)
 PHARMACY - ANTICOAGULATION CONSULT NOTE  Pharmacy Consult for heparin  Indication: multivessel CAD  Allergies  Allergen Reactions   Lasix [Furosemide] Shortness Of Breath and Rash   Bacitracin Other (See Comments)    Burns skin     Ciprofloxacin  Swelling   Citalopram Diarrhea   Doxycycline  Swelling and Other (See Comments)    burning all over body     Latex Itching   Morphine And Codeine  Hives   Duloxetine Other (See Comments)    Mental Status Changes (intolerance) saw pictures of gun   Levaquin [Levofloxacin In D5w] Other (See Comments)    Pt does not remember reaction but states she's had issues with med   Salvia Officinalis Other (See Comments)    Sage- sneezing    Soma [Carisoprodol] Other (See Comments)    Sleepy and constipation      Patient Measurements: Height: 5' 2 (157.5 cm) Weight: 74.2 kg (163 lb 9.3 oz) IBW/kg (Calculated) : 50.1 HEPARIN  DW (KG): 67.4  Vital Signs: Temp: 98.7 F (37.1 C) (06/24 0447) Temp Source: Oral (06/24 0447) BP: 91/57 (06/24 0447) Pulse Rate: 83 (06/24 0451)  Labs: Recent Labs    09/15/23 0441 09/15/23 0830 09/15/23 1624 09/16/23 0500 09/17/23 0457  HGB 9.5*  --   --  9.3* 9.7*  HCT 31.6*  --   --  30.5* 31.5*  PLT 344  --   --  405* 404*  HEPARINUNFRC 0.81*  --  0.76* 0.47 0.24*  CREATININE  --  1.26*  --  1.40* 1.29*    Estimated Creatinine Clearance: 39.3 mL/min (A) (by C-G formula based on SCr of 1.29 mg/dL (H)).   Assessment: 27 yoF admitted with PNA found to have elevated troponins. Pharmacy consulted for IV heparin  dosing. Pt s/p cath 6/20 which shows multivessel CAD. TCTS consulted. Heparin  resumed while sorting out revascularization plans.  Heparin  level  is subtherapeutic on 1000 units/hr.  No issues with infusion or bleeding per RN.  Goal of Therapy:  Heparin  level 0.3-0.7 units/ml Monitor platelets by anticoagulation protocol: Yes   Plan:  Increase heparin  to 1100 units/h F/u 8hr  level Monitor daily heparin  level, CBC, signs/symptoms of bleeding    Jinnie Door, PharmD, BCPS, BCCP Clinical Pharmacist  Please check AMION for all [facility] Pharmacy phone numbers After 10:00 PM, call Main Pharmacy [phone]

## 2023-09-17 NOTE — TOC Progression Note (Signed)
 Transition of Care [facility]) - Progression Note    Patient Details  Name: Anna [name] MRN: [id number] Date of Birth: [birth year]  Transition of Care [facility]) CM/SW Contact  Luann SHAUNNA [place], [place] Phone Number: 09/17/2023, 12:48 PM  Clinical Narrative:     CSW met with pt, granddaughter molly bedside and granddaughter Clayborne on speaker phone to discuss SNF choice. Pt reiterates she does not want [facility]. CSW provided bed offers list with medicare star ratings. Pt chooses [facility]. CSW notified [facility](awaiting confirmation). CSW also left voicemail with [facility] social worker is Alan [phone]) with update.  TOC will continue to follow.     Expected Discharge Plan: Skilled Nursing Facility Barriers to Discharge: Continued Medical Work up, SNF Pending bed offer  Expected Discharge Plan and Services In-house Referral: Clinical Social Work Discharge Planning Services: CM Consult Post Acute Care Choice: Skilled Nursing Facility Living arrangements for the past 2 months: Apartment                 DME Arranged: N/A DME Agency: NA       HH Arranged:  (see note)           Social Determinants of Health (SDOH) Interventions SDOH Screenings   Food Insecurity: No Food Insecurity (09/10/2023)  Housing: High Risk (09/10/2023)  Transportation Needs: No Transportation Needs (09/10/2023)  Utilities: At Risk (09/10/2023)  Social Connections: Socially Isolated (09/10/2023)  Tobacco Use: Low Risk  (09/10/2023)    Readmission Risk Interventions    09/11/2023   11:53 AM  Readmission Risk Prevention Plan  Transportation Screening Complete  Medication Review (RN Care Manager) Referral to Pharmacy  PCP or Specialist appointment within 3-5 days of discharge Complete  HRI or Home Care Consult Complete

## 2023-09-17 NOTE — Progress Notes (Signed)
 PHARMACY - ANTICOAGULATION CONSULT NOTE  Pharmacy Consult for heparin  Indication: multivessel CAD  Allergies  Allergen Reactions   Lasix [Furosemide] Shortness Of Breath and Rash   Bacitracin Other (See Comments)    Burns skin     Ciprofloxacin  Swelling   Citalopram Diarrhea   Doxycycline  Swelling and Other (See Comments)    burning all over body     Latex Itching   Morphine And Codeine  Hives   Duloxetine Other (See Comments)    Mental Status Changes (intolerance) saw pictures of gun   Levaquin [Levofloxacin In D5w] Other (See Comments)    Pt does not remember reaction but states she's had issues with med   Salvia Officinalis Other (See Comments)    Sage- sneezing    Soma [Carisoprodol] Other (See Comments)    Sleepy and constipation      Patient Measurements: Height: 5' 2 (157.5 cm) Weight: 77.1 kg (169 lb 15.6 oz) IBW/kg (Calculated) : 50.1 HEPARIN  DW (KG): 67.4  Vital Signs: Temp: 99.1 F (37.3 C) (06/24 1648) Temp Source: Oral (06/24 1648) BP: 94/68 (06/24 1648) Pulse Rate: 80 (06/24 1648)  Labs: Recent Labs    09/15/23 0441 09/15/23 0830 09/15/23 1624 09/16/23 0500 09/17/23 0457 09/17/23 1730  HGB 9.5*  --   --  9.3* 9.7*  --   HCT 31.6*  --   --  30.5* 31.5*  --   PLT 344  --   --  405* 404*  --   HEPARINUNFRC 0.81*  --    < > 0.47 0.24* 0.53  CREATININE  --  1.26*  --  1.40* 1.29*  --    < > = values in this interval not displayed.    Estimated Creatinine Clearance: 40.1 mL/min (A) (by C-G formula based on SCr of 1.29 mg/dL (H)).   Assessment: 52 yoF admitted with PNA found to have elevated troponins. Pharmacy consulted for IV heparin  dosing. Pt s/p cath 6/20 which shows multivessel CAD. TCTS consulted. Heparin  resumed while sorting out revascularization plans.  Heparin  level this evening came back therapeutic at 0.53, on heparin  infusion at 1100 units/hr. Hgb 9.7, plt 404. No s/sx of bleeding or infusion issues.   Goal of Therapy:   Heparin  level 0.3-0.7 units/ml Monitor platelets by anticoagulation protocol: Yes   Plan:  Continue heparin  infusion at 1100 units/hr Monitor daily heparin  level, CBC, signs/symptoms of bleeding    Thank you for allowing pharmacy to participate in this patient's care,  Suzen Sour, PharmD, BCCCP Clinical Pharmacist  Phone: [phone] 09/17/2023 6:12 PM  Please check AMION for all [facility] Pharmacy phone numbers After 10:00 PM, call Main Pharmacy [phone]

## 2023-09-17 NOTE — Plan of Care (Signed)
  Problem: Health Behavior/Discharge Planning: Goal: Ability to manage health-related needs will improve Outcome: Progressing   Problem: Clinical Measurements: Goal: Respiratory complications will improve Outcome: Progressing Goal: Cardiovascular complication will be avoided Outcome: Progressing   Problem: Nutrition: Goal: Adequate nutrition will be maintained Outcome: Progressing   Problem: Pain Managment: Goal: General experience of comfort will improve and/or be controlled Outcome: Progressing   Problem: Safety: Goal: Ability to remain free from injury will improve Outcome: Progressing   Problem: Cardiovascular: Goal: Ability to achieve and maintain adequate cardiovascular perfusion will improve Outcome: Progressing Goal: Vascular access site(s) Level 0-1 will be maintained Outcome: Progressing

## 2023-09-17 NOTE — Progress Notes (Signed)
 PROGRESS NOTE  [name] FMW:[id number] DOB: 1955-06-17   PCP: Cloria Annabella CROME, DO  Patient is from: Home.  DOA: 09/09/2023 LOS: 7  Chief complaints Chief Complaint  Patient presents with   Cough     Subjective: The patient was seen and examined this morning, stable no acute distress. Satting 100% on room air Complaining of chest pain with exertion, no issues overnight.     Brief Narrative / Interim history: Anna [name] is 68 year old F with PMH of COPD, CVA, IDDM-2, left BKA, HTN, anxiety, depression and reported COVID-19 infection about 2 and half weeks prior for which she was treated with Paxlovid  presents with shortness of breath and cough, and admitted with COPD exacerbation.  In ED, she was slightly tachycardic and tachypneic. Cr 1.19.  WBC 11.4.  Hgb 9.8.  Platelet 484.  UA with large LE.  CXR without acute finding.  A 20 pathogen RVP nonreactive.  Patient was started on Solu-Medrol , ceftriaxone  and nebulizers and admitted.  CT angio chest negative for PE but scattered basilar predominant interstitial and groundglass opacities compatible with given history of recent COVID-19 pneumonia, borderline enlarged mediastinal adenopathy likely reactive and nonspecific mild periportal edema.  TTE with LVEF of 40 to 45%, RWMA, G1 DD and RVSP of 30 mmHg.  BNP elevated to 1300.  Troponin elevated to 385.  Cardiology consulted.  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  CTS consulted.  Plan for CABG once pulmonary status improves.  COVID-19 swab nonreactive.  Respiratory symptoms improving. --------------------------------------------------------------------------------------------------------------------------   Assessment and plan:  Persistent cough/shortness of breath, respiratory distress: - Much improved - on room air satting 100% - Shortness of breath and chest pain with exertion,   - Multifactorial including asthma exacerbation, tracheomalacia, GERD and postviral from  recent COVID-19 infection.  CXR negative.  Pro-Cal negative.  CRP less than 0.5.  ESR elevated.  Repeat COVID-19 PCR nonreactive.   CT angio reviewed,   No oxygen requirement.  Cough has improved.  She has no significant respiratory distress.  -Completed 5 days of Solu-Medrol  on 6/21. -Continue Brovana , Pulmicort  and Yupelri -Continue DuoNebs as needed -Continue Protonix  40 mg twice daily -Advised to avoid GERD triggers. -Mucolytic's and antitussive .  Non-STEMI -with two-vessel coronary artery disease:  Reportedly had exertional chest pain going on for weeks.   Troponin elevated.  EKG without acute ischemic finding.   TTE as above.  LHC with multivessel CAD. -Continue IV heparin , Lipitor  and aspirin   -CTS consulted for CABG evaluation.... At this point patient is a poor candidate for CABG -further discussion with CT surgeon and cardiology and IR  Pulmonary provided perioperative recommendations.     Acute HFmrEF: - Stable  TTE with LVEF of 40 to 45%, RWMA and RVSP of 30 mmHg.  No acute ischemic changes on EKG.  BNP elevated to 1300.  Troponin elevated 385.  Allergic to furosemide.  LHC as below.  Hypotensive this morning. -Appreciate help by cardiology-discontinued Bumex . -Notified cardiology about hypotension and they recommended holding cardiac meds. -Strict intake and output, daily weight, renal functions and electrolytes - Cardiology following closely     Recent COVID-19 infection: Treated with Paxlovid  outpatient 3 weeks ago.  Rapid COVID-19 swab negative -Discontinue isolation precaution  IDDM-2 with hyperglycemia, neuropathy: Hyperglycemia likely due to steroid.  A1c 5.7%. CBG (last 3)  Recent Labs    09/16/23 2111 09/17/23 0758 09/17/23 1205  GLUCAP 116* 91 103*    -Continue SSI-resistant - Continue NovoLog  3 units 3 times daily with meals -  Continue Semglee  15 units daily. 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  Likely from hypotensive episode, LHC, losartan  and Aldactone .  Recent Labs    08/08/23 1217 09/09/23 1931 09/10/23 0528 09/11/23 0620 09/12/23 0335 09/13/23 0408 09/14/23 1139 09/15/23 0830 09/16/23 0500 09/17/23 0457  BUN 10 13 14 15 22  24* 30* 33* 31* 24*  CREATININE 1.09* 1.19* 1.24* 1.05* 1.12* 1.14* 1.47* 1.26* 1.40* 1.29*  - Continue monitoring - Avoid hypotension.  Normocytic anemia: Stable, monitoring    Generalized weakness/left BKA -PT/OT-recommended SNF  Concern for yeast infection -Antibiotics discontinued. -Nystatin .  Constipation -Bowel regimen after LHC  Mood disorder/chronic pain - Continue home meds  Body mass index is 29.92 kg/m.  Chronic renal stent- -to be exchanged every 4 to 6 months, consulted and discussed with on-call urologist Dr. Laury no intervention or exchange of stents are required at this point.  Patient was last seen evaluated and stents were placed by Dr. Carolee and, Dr. Carolynn       DVT prophylaxis:  SCDs  Code Status: Full code Family Communication: Updated patient's multiple family members at bedside. Level of care: Progressive Cardiac Status is: Inpatient Remains inpatient appropriate because: Non-STEMI, multivessel CAD, acute CHF   Final disposition: SNF    Consultants:  Cardiology Cardiothoracic surgery Pulmonology Urology - Dr. Alvaro   Procedures: 6/20-LHC with multivessel CAD  Microbiology summarized: Urine culture with multiple species A-20 pathogen RVP nonreactive COVID-19 PCR nonreactive    Objective: Vitals:   09/17/23 0451 09/17/23 0749 09/17/23 0755 09/17/23 1204  BP:   121/69 99/62  Pulse: 83 86 86 81  Resp:  19 19 (!) 22  Temp:   97.7 F (36.5 C) 98.6 F (37 C)  TempSrc:   Oral Oral  SpO2:  100% 100% 100%  Weight: 74.2 kg     Height:        Examination:   General:  AAO x 3,  cooperative, no distress;   HEENT:  Normocephalic, PERRL, otherwise  with in Normal limits   Neuro:  CNII-XII intact. , normal motor and sensation, reflexes intact   Lungs:   Clear to auscultation BL, Respirations unlabored,  No wheezes / crackles  Cardio:    S1/S2, RRR, No murmure, No Rubs or Gallops   Abdomen:  Soft, non-tender, bowel sounds active all four quadrants, no guarding or peritoneal signs.  Muscular  skeletal:  Limited exam -global generalized weaknesses - in bed, able to move all 4 extremities,   2+ pulses,  symmetric, No pitting edema  Skin:  Dry, warm to touch, negative for any Rashes,  Wounds: Please see nursing documentation          Sch Meds:  Scheduled Meds:  arformoterol   15 mcg Nebulization BID   [START ON 09/18/2023] aspirin   81 mg Oral Pre-Cath   aspirin  EC  81 mg Oral Daily   atorvastatin   80 mg Oral QHS   benzonatate   100 mg Oral TID   budesonide  (PULMICORT ) nebulizer solution  0.5 mg Nebulization BID   busPIRone   7.5 mg Oral BID   dextromethorphan   30 mg Oral BID   ferrous sulfate   325 mg Oral Q breakfast   FLUoxetine   40 mg Oral Daily   gabapentin   400 mg Oral TID   guaiFENesin   600 mg Oral BID   insulin  aspart  0-20 Units Subcutaneous TID WC   insulin  aspart  0-5 Units Subcutaneous QHS   insulin  aspart  3 Units Subcutaneous TID WC   insulin  glargine-yfgn  15 Units Subcutaneous Daily   loratadine   10 mg Oral Daily   losartan   25 mg Oral QPM   metoprolol  succinate  25 mg Oral Daily   nystatin    Topical BID   pantoprazole   40 mg Oral BID   revefenacin  175 mcg Nebulization Daily   rOPINIRole   0.25 mg Oral QHS   senna-docusate  2 tablet Oral BID   spironolactone   25 mg Oral Daily   tamsulosin   0.4 mg Oral Daily   topiramate   50 mg Oral BID   Continuous Infusions:  sodium chloride      heparin  1,100 Units/hr (09/17/23 0826)   PRN Meds:.acetaminophen  **OR** acetaminophen , diphenhydrAMINE , HYDROcodone  bit-homatropine, hydrOXYzine , ipratropium-albuterol , lip balm, Muscle Rub, mouth rinse, oxyCODONE , [EXPIRED]  polyethylene glycol **FOLLOWED BY** polyethylene glycol, prochlorperazine , sodium chloride  flush  Antimicrobials: Anti-infectives (From admission, onward)    Start     Dose/Rate Route Frequency Ordered Stop   09/10/23 1700  fluconazole  (DIFLUCAN ) tablet 150 mg        150 mg Oral  Once 09/10/23 1603 09/10/23 1736   09/10/23 0130  cefTRIAXone  (ROCEPHIN ) 1 g in sodium chloride  0.9 % 100 mL IVPB  Status:  Discontinued        1 g 200 mL/hr over 30 Minutes Intravenous Daily at bedtime 09/10/23 0115 09/13/23 1424        I have personally reviewed the following labs and images: CBC: Recent Labs  Lab 09/13/23 0408 09/14/23 1139 09/15/23 0441 09/16/23 0500 09/17/23 0457  WBC 12.9* 12.8* 12.9* 15.3* 12.9*  HGB 9.0* 9.6* 9.5* 9.3* 9.7*  HCT 29.7* 31.2* 31.6* 30.5* 31.5*  MCV 94.0 91.8 91.1 91.9 90.3  PLT 397 441* 344 405* 404*   BMP &GFR Recent Labs  Lab 09/13/23 0408 09/14/23 1139 09/15/23 0830 09/16/23 0500 09/17/23 0457  NA 138 136 137 139 134*  K 3.9 4.2 4.3 4.2 4.2  CL 108 103 105 103 104  CO2 20* 21* 23 22 24   GLUCOSE 136* 184* 116* 82 90  BUN 24* 30* 33* 31* 24*  CREATININE 1.14* 1.47* 1.26* 1.40* 1.29*  CALCIUM  8.8* 8.4* 8.6* 8.6* 8.1*  MG  --  2.3 2.2 2.2 2.2   Estimated Creatinine Clearance: 39.3 mL/min (A) (by C-G formula based on SCr of 1.29 mg/dL (H)).  No results for input(s): HGBA1C in the last 72 hours. Recent Labs  Lab 09/16/23 1234 09/16/23 1721 09/16/23 2111 09/17/23 0758 09/17/23 1205  GLUCAP 141* 101* 116* 91 103*   Urine analysis:    Component Value Date/Time   COLORURINE YELLOW 09/09/2023 2336   APPEARANCEUR HAZY (A) 09/09/2023 2336   LABSPEC 1.008 09/09/2023 2336   PHURINE 5.0 09/09/2023 2336   GLUCOSEU NEGATIVE 09/09/2023 2336   HGBUR MODERATE (A) 09/09/2023 2336   BILIRUBINUR NEGATIVE 09/09/2023 2336   KETONESUR NEGATIVE 09/09/2023 2336   PROTEINUR NEGATIVE 09/09/2023 2336   UROBILINOGEN 1.0 06/23/2014 1950   NITRITE NEGATIVE  09/09/2023 2336   LEUKOCYTESUR LARGE (A) 09/09/2023 2336   Sepsis Labs: Invalid input(s): PROCALCITONIN, LACTICIDVEN  Microbiology: Recent Results (from the past 240 hours)  Urine Culture     Status: Abnormal   Collection Time: 09/09/23 11:36 PM   Specimen: Urine, Random  Result Value Ref Range Status   Specimen Description URINE, RANDOM  Final   Special Requests   Final    NONE Reflexed from [id number] Performed at [facility], [address] [place], [place] [address]  Culture MULTIPLE SPECIES PRESENT, SUGGEST RECOLLECTION (A)  Final   Report Status 09/10/2023 FINAL  Final  Respiratory (~20 pathogens) panel by PCR     Status: None   Collection Time: 09/10/23  8:41 AM   Specimen: Anterior Nasal Swab; Respiratory  Result Value Ref Range Status   Adenovirus NOT DETECTED NOT DETECTED Final   Coronavirus 229E NOT DETECTED NOT DETECTED Final    Comment: (NOTE) The Coronavirus on the Respiratory Panel, DOES NOT test for the novel  Coronavirus (2019 nCoV)    Coronavirus HKU1 NOT DETECTED NOT DETECTED Final   Coronavirus NL63 NOT DETECTED NOT DETECTED Final   Coronavirus OC43 NOT DETECTED NOT DETECTED Final   Metapneumovirus NOT DETECTED NOT DETECTED Final   Rhinovirus / Enterovirus NOT DETECTED NOT DETECTED Final   Influenza A NOT DETECTED NOT DETECTED Final   Influenza B NOT DETECTED NOT DETECTED Final   Parainfluenza Virus 1 NOT DETECTED NOT DETECTED Final   Parainfluenza Virus 2 NOT DETECTED NOT DETECTED Final   Parainfluenza Virus 3 NOT DETECTED NOT DETECTED Final   Parainfluenza Virus 4 NOT DETECTED NOT DETECTED Final   Respiratory Syncytial Virus NOT DETECTED NOT DETECTED Final   Bordetella pertussis NOT DETECTED NOT DETECTED Final   Bordetella Parapertussis NOT DETECTED NOT DETECTED Final   Chlamydophila pneumoniae NOT DETECTED NOT DETECTED Final   Mycoplasma pneumoniae NOT DETECTED NOT DETECTED Final    Comment: Performed at [facility], [address], [place], [place] [address]  SARS Coronavirus 2 by RT PCR (hospital order, performed in [facility] hospital lab) *cepheid single result test* Anterior Nasal Swab     Status: None   Collection Time: 09/13/23  8:08 AM   Specimen: Anterior Nasal Swab  Result Value Ref Range Status   SARS Coronavirus 2 by RT PCR NEGATIVE NEGATIVE Final    Comment: Performed at [facility], [address], [place], [place] [address]    Radiology Studies: No results found.  55 minutes with more than 50% spent in reviewing records, counseling patient/family and coordinating care.   SIGNED: Adriana DELENA Grams, MD, FHM. FAAFP [facility],  Pager (please use [email or web address] to page/text)  Please use Epic Secure Chat for non-urgent communication (7AM-7PM) If 7PM-7AM, please contact night-coverage [email or web address],  09/17/2023, 12:12 PM

## 2023-09-17 NOTE — Progress Notes (Addendum)
 Physical Therapy Treatment Patient Details Name: Anna [name] MRN: [id number] DOB: 03/17/1956 Today's Date: 09/17/2023   History of Present Illness Anna [name] is a 68 y.o. female admitted 09/09/23 for COPD exacerbation. She reports a COVID-19 infection about 2.5 weeks prior which was treated with Paxlovid . LHC on 6/20 demonstrated multivessel CAD. Plan for CABG once pulmonary status improves. PMHx: CAD, HFrEF, COPD, CVA, IDDM-2, PAD s/p left BKA, HTN, anxiety, depression, OSA on CPAP, asthma/chronic bronchitis, and obesity.    PT Comments  Pt progressing steadily toward goals limited by abdominal pain from coughing and lack of prosthesis with her in the hospital.  Emphasis on strengthening ex, and standing, transfers at EOB with mod assist, min for standing once up.   Patient will benefit from continued inpatient follow up therapy, <3 hours/day      If plan is discharge home, recommend the following: A lot of help with walking and/or transfers;A lot of help with bathing/dressing/bathroom;Assistance with cooking/housework;Assist for transportation;Help with stairs or ramp for entrance   Can travel by private vehicle     No  Equipment Recommendations   (TBD)    Recommendations for Other Services       Precautions / Restrictions Precautions Precautions: Fall Recall of Precautions/Restrictions: Intact     Mobility  Bed Mobility Overal bed mobility: Needs Assistance Bed Mobility: Rolling, Sidelying to Sit, Sit to Supine Rolling: Contact guard assist, Used rails Sidelying to sit: Min assist, Used rails   Sit to supine: Contact guard assist, Used rails   General bed mobility comments: minimal stability assist to transition up to EOB o/w CGA    Transfers Overall transfer level: Needs assistance Equipment used: Rolling walker (2 wheels) Transfers: Sit to/from Stand, Bed to chair/wheelchair/BSC Sit to Stand: Mod assist          Lateral/Scoot Transfers: Mod  assist General transfer comment: pt placed L stump on the bed and stood up on both LE's x2 into the RW,  Second stand, pt scooted up toward [facility].    Ambulation/Gait               General Gait Details: NT   Stairs             Wheelchair Mobility     Tilt Bed    Modified Rankin (Stroke Patients Only)       Balance Overall balance assessment: Needs assistance Sitting-balance support: Single extremity supported, No upper extremity supported, Feet supported Sitting balance-Leahy Scale: Fair     Standing balance support: Reliant on assistive device for balance, Single extremity supported Standing balance-Leahy Scale: Poor                              Communication    Cognition Arousal: Alert Behavior During Therapy: WFL for tasks assessed/performed, Lability   PT - Cognitive impairments: No apparent impairments                       PT - Cognition Comments: Pt A,Ox4. Following commands: Intact      Cueing Cueing Techniques: Verbal cues  Exercises General Exercises - Lower Extremity Hip ABduction/ADduction: AROM, AAROM, Both, 10 reps Straight Leg Raises: AROM, Both, 10 reps, Supine Hip Flexion/Marching: AROM, Both, 10 reps, Supine (graded resistance) Other Exercises Other Exercises: bicep/tricep presses x 10 reps bil    General Comments General comments (skin integrity, edema, etc.): Sitting BP after standing trial  99/62, pt  mildly symptomatic      Pertinent Vitals/Pain Pain Assessment Pain Assessment: Faces Faces Pain Scale: Hurts even more Pain Location: stomach-from coughing pt reports. Pain Descriptors / Indicators: Discomfort, Aching Pain Intervention(s): Monitored during session    Home Living                          Prior Function            PT Goals (current goals can now be found in the care plan section) Acute Rehab PT Goals PT Goal Formulation: With patient/family Time For Goal Achievement:  09/29/23 Potential to Achieve Goals: Fair Progress towards PT goals: Progressing toward goals    Frequency    Min 2X/week      PT Plan      Co-evaluation              AM-PAC PT 6 Clicks Mobility   Outcome Measure  Help needed turning from your back to your side while in a flat bed without using bedrails?: A Little Help needed moving from lying on your back to sitting on the side of a flat bed without using bedrails?: A Lot Help needed moving to and from a bed to a chair (including a wheelchair)?: A Lot Help needed standing up from a chair using your arms (e.g., wheelchair or bedside chair)?: A Lot Help needed to walk in hospital room?: Total Help needed climbing 3-5 steps with a railing? : Total 6 Click Score: 11    End of Session Equipment Utilized During Treatment: Gait belt Activity Tolerance: Patient tolerated treatment well Patient left: in bed;with call bell/phone within reach;with family/visitor present Nurse Communication: Mobility status Pain - part of body:  (abdomen)     Time: 8678-8644 PT Time Calculation (min) (ACUTE ONLY): 34 min  Charges:    $Therapeutic Exercise: 8-22 mins $Therapeutic Activity: 8-22 mins PT General Charges $$ ACUTE PT VISIT: 1 Visit                     09/17/2023  India HERO., PT Acute Rehabilitation Services [phone]  (office)   Vinie GAILS Itamar Mcgowan 09/17/2023, 2:55 PM

## 2023-09-17 NOTE — Progress Notes (Addendum)
 Progress Note  Patient Name: Anna [name] Date of Encounter: 09/17/2023 [facility] Cardiologist: Oneil Parchment, MD   Interval Summary   Patient appears anxious this morning, very concerned regarding being told she is getting discharged  She states she doesn't have any family at home and she doesn't want to die alone in her house She wants to discuss options regarding home health or nursing home placement prior to discharge  She also is having 5-6/10 chest pain this AM while getting upset about her discharge plan Updated EKG shows sinus rhythm with frequent ectopy  She states her chest pain does occur when she is having anxiety and lasts seconds at a time RN in the room, giving her morning meds, will see if these aid with chest pain  Vital Signs Vitals:   09/17/23 0430 09/17/23 0447 09/17/23 0451 09/17/23 0755  BP:  (!) 91/57  121/69  Pulse:  79 83 85  Resp: 18 18  (!) 24  Temp: 98.7 F (37.1 C) 98.7 F (37.1 C)  97.7 F (36.5 C)  TempSrc: Oral Oral  Oral  SpO2: 94% 96%  100%  Weight:   74.2 kg   Height:        Intake/Output Summary (Last 24 hours) at 09/17/2023 0840 Last data filed at 09/17/2023 0803 Gross per 24 hour  Intake 240 ml  Output 3100 ml  Net -2860 ml      09/17/2023    4:51 AM 09/16/2023    4:03 AM 09/15/2023    5:00 AM  Last 3 Weights  Weight (lbs) 163 lb 9.3 oz 178 lb 2.1 oz 171 lb 8.3 oz  Weight (kg) 74.2 kg 80.8 kg 77.8 kg      Telemetry/ECG  Sinus rhythm, frequent ectopy, HR 80s - Personally Reviewed  Physical Exam  GEN: No acute distress, very anxious this AM.   Neck: No JVD Cardiac: RRR, no murmurs, rubs, or gallops.  Respiratory: Clear to auscultation bilaterally. GI: Soft, nontender, non-distended  MS: No edema, s/p left BKA  Assessment & Plan  68 y.o. female with a history of PAD s/p left below knee amputation, hypertension, hyperlipidemia, type 2 diabetes mellitus, CVA in 05/2023, prior DVT, remote GI bleed in 1984 who was  admitted on 09/09/2023 for a COPD exacerbation after presenting with cough and shortness of breath. Echo showed mildly reduced EF with wall motion abnormalities in LCX territory. Troponin was then checked and came back elevated. Therefore, Cardiology was consulted. She underwent LHC on 09/13/2023 which showed severe multivessel CAD. CT surgery was consulted   NSTEMI Severe multivessel CAD Hyperlipidemia Presented with intermittent chest pain at rest/with exertion Troponin 385  Echo showed EF 40-45%, hypokinesis of entire inferior wall, anterior segment, inferolateral wall, and lateral wall  LHC showed severe multivessel disease Having chest pain this AM -- likely associated with anxiety/stress about going home and frequent coughing  CT surgery has been consulted, considered high-risk but potential candidate once pulmonary status improves Continue IV heparin  Continue ASA 81 mg daily Continue Toprol  25 mg daily Continue Lipitor  80 mg daily   Newly diagnosed HFmrEF Hypertension  Echo showed LVEF of 40-45% with wall motion abnormalities as described above and grade 1 diastolic dysfunction as well as moderately reduced RV function  BNP 1,360 > 964 LVEDP on cath was normal, 12 mmHg Underwent diuresis with IV Bumex   Net - 7.7 L this admission  Most recent BP 121/69, HR 80s Appears euvolemic on exam  Creatinine trending down, 1.29 today  from 1.40  Continue Losartan  25 mg daily Continue Toprol  25 mg daily Continue spironolactone  25 mg daily Deferred SGL2Ti given history of UTIs and possible upcoming CABG   PAD S/p left BKA Continue ASA and statin as above Followed by vascular surgery as outpatient   Per primary COPD Cough CKD T2DM, neuropathy Anemia  GERD CVA Chronic back pain    For questions or updates, please contact [facility] Please consult [email or web address] for contact info under       Signed, Waddell DELENA Donath, PA-C   Personally seen and examined. Agree with  above.  Spoke with Dr. Kerrin of cardiothoracic surgery.  Upon further review, she is not a candidate for bypass surgery.  I discussed with Dr. Elmira of interventional cardiology.  He performed her diagnostic cardiac catheterization on the 20th.  We will get her set up tomorrow for cardiac catheterization with atherectomy of LAD system.  Risks and benefits of been explained including stroke heart attack death renal impairment bleeding.  She is willing to proceed.  She is quite adamant she have something for her anxiety.  Assured her that those medications will be administered during the catheterization.  Continue IV heparin  for now.  On multidrug goal-directed medical therapy.  Oneil Parchment, MD

## 2023-09-17 NOTE — Consult Note (Signed)
 Reason for Consult: Right UPJ Obstruction / Poor Functional Capacity  Referring Physician: Adriana Eye MD  Anna [name] is an 68 y.o. female.   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 CT this admission 07/2023 with stent in good posiiton, no calcificaitons, no delayed nephrogram.  PMH sig fro PAD/BKA, CAD/MI, Pulmonary Insuficiency, Anxiety.  Today Anna [name] is seen in consultation for above, specifically to discuss her next stent exchange. She is currentely admitted on heparin  gtt after STEMI with likelyi percutnaeous intervention later this week.   Past Medical History:  Diagnosis Date   Anemia    Anxiety    Arthritis    Asthma    Back pain    Bronchitis    Bursitis    Chronic kidney disease    COPD (chronic obstructive pulmonary disease) (HCC)    Depression    Diabetes mellitus    Diabetic neuropathy (HCC)    Diabetic retinopathy    GERD (gastroesophageal reflux disease)    Hyperlipemia    Hypertension    Migraine    Obesity    Pneumonia    Sepsis due to urinary tract infection (HCC)    Sepsis secondary to UTI (HCC) 09/30/2022   Stroke (HCC)    2020    Past Surgical History:  Procedure Laterality Date   ABDOMINAL AORTOGRAM W/LOWER EXTREMITY Left 08/22/2021   Procedure: ABDOMINAL AORTOGRAM W/LOWER EXTREMITY;  Surgeon: Serene Gaile ORN, MD;  Location: [facility];  Service: Cardiovascular;  Laterality: Left;   AMPUTATION Left 10/11/2021   Procedure: LEFT BELOW KNEE AMPUTATION;  Surgeon: Harden Jerona GAILS, MD;  Location: [facility] OR;  Service: Orthopedics;  Laterality: Left;   CESAREAN SECTION     x3   CHOLECYSTECTOMY     CYSTOSCOPY WITH RETROGRADE PYELOGRAM, URETEROSCOPY AND STENT PLACEMENT Right 02/06/2023   Procedure: CYSTOSCOPY WITH RIGHT  RETROGRADE PYELOGRAM AND RIGHT URETERAL STENT EXCHANGE;  Surgeon: Devere Lonni Righter, MD;  Location: [facility] ORS;  Service:  Urology;  Laterality: Right;  30 MINUTES   CYSTOSCOPY WITH STENT PLACEMENT Right 09/30/2022   Procedure: CYSTOSCOPY WITH STENT PLACEMENT;  Surgeon: Carolee Sherwood JONETTA DOUGLAS, MD;  Location: [facility] OR;  Service: Urology;  Laterality: Right;   EYE SURGERY Left    retina surgery had laser   LEFT HEART CATH AND CORONARY ANGIOGRAPHY N/A 09/13/2023   Procedure: LEFT HEART CATH AND CORONARY ANGIOGRAPHY;  Surgeon: Elmira Newman PARAS, MD;  Location: [facility];  Service: Cardiovascular;  Laterality: N/A;   METATARSAL HEAD EXCISION Left 08/24/2021   Procedure: METATARSAL HEAD EXCISION FIFTH TOE;  Surgeon: Gershon Donnice SAUNDERS, DPM;  Location: [facility] OR;  Service: Podiatry;  Laterality: Left;   PERIPHERAL VASCULAR BALLOON ANGIOPLASTY  08/22/2021   Procedure: PERIPHERAL VASCULAR BALLOON ANGIOPLASTY;  Surgeon: Serene Gaile ORN, MD;  Location: [facility];  Service: Cardiovascular;;  Left AT   WOUND DEBRIDEMENT Left 08/24/2021   Procedure: DEBRIDEMENT WOUND OF LEFT FOOT;  Surgeon: Gershon Donnice SAUNDERS, DPM;  Location: [facility] OR;  Service: Podiatry;  Laterality: Left;    History reviewed. No pertinent family history.  Social History:  reports that she has never smoked. She has never used smokeless tobacco. She reports that she does not drink alcohol and does not use drugs.  Allergies:  Allergies  Allergen Reactions   Lasix [Furosemide] Shortness Of Breath and Rash   Bacitracin Other (See Comments)    Burns skin  Ciprofloxacin  Swelling   Citalopram Diarrhea   Doxycycline  Swelling and Other (See Comments)    burning all over body     Latex Itching   Morphine And Codeine  Hives   Duloxetine Other (See Comments)    Mental Status Changes (intolerance) saw pictures of gun   Levaquin [Levofloxacin In D5w] Other (See Comments)    Pt does not remember reaction but states she's had issues with med   Salvia Officinalis Other (See Comments)    Sage- sneezing    Soma [Carisoprodol] Other (See Comments)     Sleepy and constipation      Medications: I have reviewed the patient's current medications.  Results for orders placed or performed during the hospital encounter of 09/09/23 (from the past 48 hours)  Heparin  level (unfractionated)     Status: Abnormal   Collection Time: 09/15/23  4:24 PM  Result Value Ref Range   Heparin  Unfractionated 0.76 (H) 0.30 - 0.70 IU/mL    Comment: (NOTE) The clinical reportable range upper limit is being lowered to >1.10 to align with the FDA approved guidance for the current laboratory assay.  If heparin  results are below expected values, and patient dosage has  been confirmed, suggest follow up testing of antithrombin III levels. Performed at [facility], [address] [place], [place] [address]   Glucose, capillary     Status: Abnormal   Collection Time: 09/15/23  4:58 PM  Result Value Ref Range   Glucose-Capillary 173 (H) 70 - 99 mg/dL    Comment: Glucose reference range applies only to samples taken after fasting for at least 8 hours.  Glucose, capillary     Status: Abnormal   Collection Time: 09/15/23  9:05 PM  Result Value Ref Range   Glucose-Capillary 110 (H) 70 - 99 mg/dL    Comment: Glucose reference range applies only to samples taken after fasting for at least 8 hours.  Basic metabolic panel with GFR     Status: Abnormal   Collection Time: 09/16/23  5:00 AM  Result Value Ref Range   Sodium 139 135 - 145 mmol/L   Potassium 4.2 3.5 - 5.1 mmol/L   Chloride 103 98 - 111 mmol/L   CO2 22 22 - 32 mmol/L   Glucose, Bld 82 70 - 99 mg/dL    Comment: Glucose reference range applies only to samples taken after fasting for at least 8 hours.   BUN 31 (H) 8 - 23 mg/dL   Creatinine, Ser 8.59 (H) 0.44 - 1.00 mg/dL   Calcium  8.6 (L) 8.9 - 10.3 mg/dL   GFR, Estimated 41 (L) >60 mL/min    Comment: (NOTE) Calculated using the CKD-EPI Creatinine Equation (2021)    Anion gap 14 5 - 15    Comment: Performed at [facility], [address], [place], [place] [address]  CBC     Status: Abnormal   Collection Time: 09/16/23  5:00 AM  Result Value Ref Range   WBC 15.3 (H) 4.0 - 10.5 K/uL   RBC 3.32 (L) 3.87 - 5.11 MIL/uL   Hemoglobin 9.3 (L) 12.0 - 15.0 g/dL   HCT 69.4 (L) 63.9 - 53.9 %   MCV 91.9 80.0 - 100.0 fL   MCH 28.0 26.0 - 34.0 pg   MCHC 30.5 30.0 - 36.0 g/dL   RDW 84.1 (H) 88.4 - 84.4 %   Platelets 405 (H) 150 - 400 K/uL   nRBC 0.0 0.0 - 0.2 %  Comment: Performed at [facility], [address] [place], [place] [address]  Heparin  level (unfractionated)     Status: None   Collection Time: 09/16/23  5:00 AM  Result Value Ref Range   Heparin  Unfractionated 0.47 0.30 - 0.70 IU/mL    Comment: (NOTE) The clinical reportable range upper limit is being lowered to >1.10 to align with the FDA approved guidance for the current laboratory assay.  If heparin  results are below expected values, and patient dosage has  been confirmed, suggest follow up testing of antithrombin III levels. Performed at [facility], [address], [place], [place] [address]   Magnesium      Status: None   Collection Time: 09/16/23  5:00 AM  Result Value Ref Range   Magnesium  2.2 1.7 - 2.4 mg/dL    Comment: Performed at [facility], [address], [place], [place] [address]  Glucose, capillary     Status: None   Collection Time: 09/16/23  8:07 AM  Result Value Ref Range   Glucose-Capillary 82 70 - 99 mg/dL    Comment: Glucose reference range applies only to samples taken after fasting for at least 8 hours.  Glucose, capillary     Status: Abnormal   Collection Time: 09/16/23 12:34 PM  Result Value Ref Range   Glucose-Capillary 141 (H) 70 - 99 mg/dL    Comment: Glucose reference range applies only to samples taken after fasting for at least 8 hours.  Glucose, capillary     Status: Abnormal   Collection Time: 09/16/23  5:21 PM  Result Value Ref Range   Glucose-Capillary 101 (H) 70 - 99 mg/dL    Comment: Glucose  reference range applies only to samples taken after fasting for at least 8 hours.  Glucose, capillary     Status: Abnormal   Collection Time: 09/16/23  9:11 PM  Result Value Ref Range   Glucose-Capillary 116 (H) 70 - 99 mg/dL    Comment: Glucose reference range applies only to samples taken after fasting for at least 8 hours.  Basic metabolic panel with GFR     Status: Abnormal   Collection Time: 09/17/23  4:57 AM  Result Value Ref Range   Sodium 134 (L) 135 - 145 mmol/L   Potassium 4.2 3.5 - 5.1 mmol/L   Chloride 104 98 - 111 mmol/L   CO2 24 22 - 32 mmol/L   Glucose, Bld 90 70 - 99 mg/dL    Comment: Glucose reference range applies only to samples taken after fasting for at least 8 hours.   BUN 24 (H) 8 - 23 mg/dL   Creatinine, Ser 8.70 (H) 0.44 - 1.00 mg/dL   Calcium  8.1 (L) 8.9 - 10.3 mg/dL   GFR, Estimated 45 (L) >60 mL/min    Comment: (NOTE) Calculated using the CKD-EPI Creatinine Equation (2021)    Anion gap 6 5 - 15    Comment: Performed at [facility], [address], [place], [place] [address]  Heparin  level (unfractionated)     Status: Abnormal   Collection Time: 09/17/23  4:57 AM  Result Value Ref Range   Heparin  Unfractionated 0.24 (L) 0.30 - 0.70 IU/mL    Comment: (NOTE) The clinical reportable range upper limit is being lowered to >1.10 to align with the FDA approved guidance for the current laboratory assay.  If heparin  results are below expected values, and patient dosage has  been confirmed, suggest follow up testing of antithrombin III levels.  Performed at [facility], [address], [place], [place] [address]   Magnesium      Status: None   Collection Time: 09/17/23  4:57 AM  Result Value Ref Range   Magnesium  2.2 1.7 - 2.4 mg/dL    Comment: Performed at [facility], [address], [place], [place] [address]  CBC     Status: Abnormal   Collection Time: 09/17/23  4:57 AM  Result Value Ref Range   WBC 12.9 (H) 4.0 - 10.5 K/uL   RBC  3.49 (L) 3.87 - 5.11 MIL/uL   Hemoglobin 9.7 (L) 12.0 - 15.0 g/dL   HCT 68.4 (L) 63.9 - 53.9 %   MCV 90.3 80.0 - 100.0 fL   MCH 27.8 26.0 - 34.0 pg   MCHC 30.8 30.0 - 36.0 g/dL   RDW 84.2 (H) 88.4 - 84.4 %   Platelets 404 (H) 150 - 400 K/uL   nRBC 0.0 0.0 - 0.2 %    Comment: Performed at [facility], [address] [place], [place] [address]  Glucose, capillary     Status: None   Collection Time: 09/17/23  7:58 AM  Result Value Ref Range   Glucose-Capillary 91 70 - 99 mg/dL    Comment: Glucose reference range applies only to samples taken after fasting for at least 8 hours.  Glucose, capillary     Status: Abnormal   Collection Time: 09/17/23 12:05 PM  Result Value Ref Range   Glucose-Capillary 103 (H) 70 - 99 mg/dL    Comment: Glucose reference range applies only to samples taken after fasting for at least 8 hours.    VAS US  LOWER EXTREMITY SAPHENOUS VEIN MAPPING Result Date: 09/16/2023 LOWER EXTREMITY VEIN MAPPING Patient Name:  Anna [name]  Date of Exam:   09/16/2023 Medical Rec #: [id number]         Accession #:    [id number] Date of Birth: 12/11/1955        Patient Gender: F Patient Age:   87 years Exam Location:  [facility] Procedure:      VAS US  LOWER EXTREMITY SAPHENOUS VEIN MAPPING Referring Phys: KYLA DONALD --------------------------------------------------------------------------------  Indications:  Pre-op Risk Factors: Hypertension, hyperlipidemia, Diabetes.  Performing Technologist: Elmarie Lindau, RVT  Examination Guidelines: A complete evaluation includes B-mode imaging, spectral Doppler, color Doppler, and power Doppler as needed of all accessible portions of each vessel. Bilateral testing is considered an integral part of a complete examination. Limited examinations for reoccurring indications may be performed as noted. +----------------+-----------+--------------+----------------+-----------+ RT Diameter (cm)RT Findings     GSV      LT Diameter  (cm)LT Findings +----------------+-----------+--------------+----------------+-----------+       0.58                 Proximal thigh      0.43                  +----------------+-----------+--------------+----------------+-----------+       0.17       branching   Mid thigh                               +----------------+-----------+--------------+----------------+-----------+       0.15                  Distal thigh                             +----------------+-----------+--------------+----------------+-----------+  0.14                      Knee                                 +----------------+-----------+--------------+----------------+-----------+       0.16                   Prox calf                               +----------------+-----------+--------------+----------------+-----------+ Right Tech Comments: Patent right GSV with normal compression and no evidence of thrombus. The GSV is not seen distal to the prox calf area. Left Tech Comments: Lt BKA Summary:  Right: Patent right great saphenous vein with no evidence of        thrombus.        The right great saphenous vein measures 0.14 to 0.58 cm.  *See table(s) above for measurements and observations. Diagnosing physician: Lonni Gaskins MD Electronically signed by Lonni Gaskins MD on 09/16/2023 at 2:47:09 PM.    Final    VAS US  CAROTID Result Date: 09/16/2023 Carotid Arterial Duplex Study Patient Name:  Anna [name]  Date of Exam:   09/16/2023 Medical Rec #: [id number]         Accession #:    [id number] Date of Birth: 07/15/1955        Patient Gender: F Patient Age:   57 years Exam Location:  [facility] Procedure:      VAS US  CAROTID Referring Phys: KYLA DONALD --------------------------------------------------------------------------------  Indications:   Bilateral bruits. Risk Factors:  Hypertension, hyperlipidemia, Diabetes, prior CVA. Other Factors: Hx of bilat 1-39% ICA stenosis.  Performing Technologist: Elmarie Lindau, RVT  Examination Guidelines: A complete evaluation includes B-mode imaging, spectral Doppler, color Doppler, and power Doppler as needed of all accessible portions of each vessel. Bilateral testing is considered an integral part of a complete examination. Limited examinations for reoccurring indications may be performed as noted.  Right Carotid Findings: +----------+--------+--------+--------+------------------+------------------+           PSV cm/sEDV cm/sStenosisPlaque DescriptionComments           +----------+--------+--------+--------+------------------+------------------+ CCA Prox  59      12                                                   +----------+--------+--------+--------+------------------+------------------+ CCA Distal41      10              heterogenous                         +----------+--------+--------+--------+------------------+------------------+ ICA Prox  82      27                                intimal thickening +----------+--------+--------+--------+------------------+------------------+ ICA Distal77      22                                                   +----------+--------+--------+--------+------------------+------------------+  ECA       50                                                           +----------+--------+--------+--------+------------------+------------------+ +----------+--------+-------+----------------+-------------------+           PSV cm/sEDV cmsDescribe        Arm Pressure (mmHG) +----------+--------+-------+----------------+-------------------+ [id number]            Multiphasic, WNL                    +----------+--------+-------+----------------+-------------------+ +---------+--------+--+--------+--+---------+ VertebralPSV cm/s38EDV cm/s12Antegrade +---------+--------+--+--------+--+---------+  Left Carotid Findings:  +----------+--------+--------+--------+------------------+--------+           PSV cm/sEDV cm/sStenosisPlaque DescriptionComments +----------+--------+--------+--------+------------------+--------+ CCA Prox  72      20                                         +----------+--------+--------+--------+------------------+--------+ CCA Distal72      19              heterogenous               +----------+--------+--------+--------+------------------+--------+ ICA Prox  66      17              heterogenous               +----------+--------+--------+--------+------------------+--------+ ICA Distal103     31                                         +----------+--------+--------+--------+------------------+--------+ ECA       124                                                +----------+--------+--------+--------+------------------+--------+ +----------+--------+--------+----------------+-------------------+           PSV cm/sEDV cm/sDescribe        Arm Pressure (mmHG) +----------+--------+--------+----------------+-------------------+ [id number]              Multiphasic, WNL                    +----------+--------+--------+----------------+-------------------+ +---------+--------+--+--------+-+---------+ VertebralPSV cm/s34EDV cm/s8Antegrade +---------+--------+--+--------+-+---------+   Summary: Right Carotid: Velocities in the right ICA are consistent with a 1-39% stenosis. Left Carotid: Velocities in the left ICA are consistent with a 1-39% stenosis. Vertebrals:  Bilateral vertebral arteries demonstrate antegrade flow. Subclavians: Normal flow hemodynamics were seen in bilateral subclavian              arteries. *See table(s) above for measurements and observations.  Electronically signed by Lonni Gaskins MD on 09/16/2023 at 2:46:52 PM.    Final     Review of Systems  Constitutional:  Negative for chills and fever.  Neurological:  Positive for weakness.   All other systems reviewed and are negative.  Blood pressure 99/62, pulse 81, temperature 98.6 F (37 C), temperature source Oral, resp. rate (!) 22, height 5' 2 (1.575 m), weight 74.2 kg, SpO2 100%. Physical Exam Vitals reviewed.  Constitutional:  Comments: Appears older than stated age. Very pleasant. Family at bedside. Talkative.   HENT:     Head: Normocephalic.   Eyes:     Pupils: Pupils are equal, round, and reactive to light.   Neck:     Comments: Collins central line c/d/I.  Cardiovascular:     Rate and Rhythm: Normal rate.  Abdominal:     General: Abdomen is flat.     Comments: Significant truncal obesity. NO CVAT.   Genitourinary:    Comments: No CVAT  Musculoskeletal:        General: Normal range of motion.     Cervical back: Normal range of motion.   Skin:    General: Skin is warm.   Neurological:     General: No focal deficit present.     Mental Status: She is alert.   Psychiatric:        Mood and Affect: Mood normal.     Assessment/Plan:  Pt reassured. Her stent is in good position and funcitoning well and appears to have plenty of life in it. Do NOT favor OR stent change this hospitalization while her CV and pulm status remain very tenuous. Her stent changes are elective.   We will verify she has office FU set for about 6 weeks with Dr. Devere to discuss next elective stent change pending her functional status.Pt voiced understanding and happy with plan.   Please call with questions.    Ricardo KATHEE Alvaro Mickey. 09/17/2023, 12:20 PM

## 2023-09-17 NOTE — Progress Notes (Signed)
   Heart Failure Stewardship Pharmacist Progress Note   PCP: Cloria Annabella CROME, DO PCP-Cardiologist: Oneil Parchment, MD    HPI:  2 YOF with PMH of HTN, DM, COPD, CVA, depression, anxiety, s/p BKA. Recent self-reported COVID infection 2 1/2 weeks ago.  Presented to ED with cough and dyspnea. Received molnupiravir, symptoms initially improved then worsened, then given doxy. CXR showed no acute finding.  ECHO showed LVEF 40-45% (previously 60-65% 05/2023), with mildly decreased function, grade I diastolic dysfunction, severe hypokinesis. Right ventricle systolic function is moderately reduced.  L heart cath showed LAD Prox diffuse 70% stenosis, Mid 90% stenosis, Diag 2 with prox diffuse 80% stenosis. RCA Prox 100% occlusion with severe prox-mid calcification, left-to-right collaterals from LAD to RPDA. If not surgical candidate, medical management alone may be sufficient.  Per CT surgery, not candidate for CABG. Atherectomy planned for tomorrow, 6/25.  On exam, patient states she has some chest discomfort that started this morning, could be attributed to anxiety as mentioned in MD note. Also, endorses some dizziness. Denies SOB, no lower extremity swelling present.   Current HF Medications: Beta Blocker: metoprolol  succ 25 mg daily - did not receive yesterday d/t BP ACE/ARB/ARNI: losartan  25 mg QPM MRA: spironolactone  25 mg daily  Prior to admission HF Medications: ACE/ARB/ARNI: lisinopril  5 mg daily  Pertinent Lab Values: Serum creatinine 1.40>1.29, BUN 24, Potassium 4.2, Sodium 134, BNP 964, Mg 2.2,  A1c 7.5 (05/2023)  Vital Signs: Weight: 178 lbs today. Admission weight: 160 lbs - incomplete - bed weights.  Blood pressure: 90-105/50's Heart rate: 79 I/O: net -1.15L yesterday; net -7.78L since admission  Medication Assistance / Insurance Benefits Check: Does the patient have prescription insurance?  Yes Type of insurance plan: [employer]   Outpatient Pharmacy:  Prior to  admission outpatient pharmacy: [facility] - [place], [place] - [address] Is the patient willing to use [facility] TOC pharmacy at discharge? Pending Is the patient willing to transition their outpatient pharmacy to utilize a [facility] outpatient pharmacy?   No   Assessment: 1. 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 NYHA class III symptoms. - Continue spironolactone  25 mg daily.  - Continue metoprolol  succ 25 mg daily - Continue losartan  25 mg daily - Daily weight. Strict I/O's - Keep K > 4, Mg > 2.    Plan: 1) Medication changes recommended at this time: - Continue to monitor BP and assess for symptoms of hypotension - Avoid SGLTi - history of chronic UTI  2) Patient assistance: - Has insurance with [employer]   3)  Education  - Initial education provided. - To be completed prior to discharge  Bernardino George, PharmD Candidate [address] [facility] [employer] 09/17/2023 11:13 AM

## 2023-09-17 NOTE — Progress Notes (Signed)
 4 Days Post-Op Procedure(s) (LRB): LEFT HEART CATH AND CORONARY ANGIOGRAPHY (N/A) Subjective: Very anxious about ureteral stent.  Very anxious that she is going to die.  Objective: Vital signs in last 24 hours: Temp:  [97.7 F (36.5 C)-98.7 F (37.1 C)] 97.7 F (36.5 C) (06/24 0755) Pulse Rate:  [79-86] 86 (06/24 0755) Cardiac Rhythm: Normal sinus rhythm (06/24 0800) Resp:  [16-19] 19 (06/24 0755) BP: (91-121)/(42-69) 121/69 (06/24 0755) SpO2:  [9 %-100 %] 100 % (06/24 0755) FiO2 (%):  [21 %] 21 % (06/23 2252) Weight:  [74.2 kg] 74.2 kg (06/24 0451)  Hemodynamic parameters for last 24 hours:    Intake/Output from previous day: 06/23 0701 - 06/24 0700 In: 240 [P.O.:240] Out: 2400 [Urine:1600; Blood:800] Intake/Output this shift: Total I/O In: 240 [P.O.:240] Out: 700 [Urine:700]  General appearance: alert, cooperative, and no distress Neurologic: intact and anxious Heart: regular rate and rhythm  Lab Results: Recent Labs    09/16/23 0500 09/17/23 0457  WBC 15.3* 12.9*  HGB 9.3* 9.7*  HCT 30.5* 31.5*  PLT 405* 404*   BMET:  Recent Labs    09/16/23 0500 09/17/23 0457  NA 139 134*  K 4.2 4.2  CL 103 104  CO2 22 24  GLUCOSE 82 90  BUN 31* 24*  CREATININE 1.40* 1.29*  CALCIUM  8.6* 8.1*    PT/INR: No results for input(s): LABPROT, INR in the last 72 hours. ABG    Component Value Date/Time   TCO2 22 06/02/2023 1012   CBG (last 3)  Recent Labs    09/16/23 1721 09/16/23 2111 09/17/23 0758  GLUCAP 101* 116* 91    Assessment/Plan: S/P Procedure(s) (LRB): LEFT HEART CATH AND CORONARY ANGIOGRAPHY (N/A) - 68 year old woman with multiple medical issues and two-vessel coronary disease.  Chronic total occlusion of the RCA.  Significant disease in the LAD with tight lesions in the LAD and diagonal.  Poor operative candidate given comorbidities and limited functional status.  Do not think she would do well with CABG.  Discussed with Dr. Jeffrie.  He  will review with interventional radiology to see if any percutaneous options are available.  Even if not ideal given her diabetes, still a better option in her case then bypass surgery where she almost certainly will do poorly.   LOS: 7 days    Elspeth JAYSON Millers 09/17/2023

## 2023-09-18 ENCOUNTER — Encounter (HOSPITAL_COMMUNITY): Payer: Self-pay | Admitting: Cardiology

## 2023-09-18 ENCOUNTER — Inpatient Hospital Stay (HOSPITAL_COMMUNITY)
Admission: EM | Disposition: A | Discharge status: Skilled Nursing Facility | Payer: Self-pay | Source: Home / Self Care | Attending: Internal Medicine

## 2023-09-18 ENCOUNTER — Inpatient Hospital Stay (HOSPITAL_COMMUNITY): Payer: Medicare (Managed Care)

## 2023-09-18 DIAGNOSIS — I214 Non-ST elevation (NSTEMI) myocardial infarction: Secondary | ICD-10-CM | POA: Diagnosis not present

## 2023-09-18 DIAGNOSIS — I959 Hypotension, unspecified: Secondary | ICD-10-CM

## 2023-09-18 DIAGNOSIS — H469 Unspecified optic neuritis: Secondary | ICD-10-CM

## 2023-09-18 DIAGNOSIS — J441 Chronic obstructive pulmonary disease with (acute) exacerbation: Secondary | ICD-10-CM | POA: Diagnosis not present

## 2023-09-18 HISTORY — PX: CORONARY STENT INTERVENTION: CATH118234

## 2023-09-18 LAB — CBC
HCT: 31.6 % — ABNORMAL LOW (ref 36.0–46.0)
Hemoglobin: 9.6 g/dL — ABNORMAL LOW (ref 12.0–15.0)
MCH: 28.5 pg (ref 26.0–34.0)
MCHC: 30.4 g/dL (ref 30.0–36.0)
MCV: 93.8 fL (ref 80.0–100.0)
Platelets: 380 10*3/uL (ref 150–400)
RBC: 3.37 MIL/uL — ABNORMAL LOW (ref 3.87–5.11)
RDW: 15.6 % — ABNORMAL HIGH (ref 11.5–15.5)
WBC: 13.8 10*3/uL — ABNORMAL HIGH (ref 4.0–10.5)
nRBC: 0 % (ref 0.0–0.2)

## 2023-09-18 LAB — BASIC METABOLIC PANEL WITH GFR
Anion gap: 11 (ref 5–15)
BUN: 20 mg/dL (ref 8–23)
CO2: 17 mmol/L — ABNORMAL LOW (ref 22–32)
Calcium: 7.9 mg/dL — ABNORMAL LOW (ref 8.9–10.3)
Chloride: 106 mmol/L (ref 98–111)
Creatinine, Ser: 1.13 mg/dL — ABNORMAL HIGH (ref 0.44–1.00)
GFR, Estimated: 53 mL/min — ABNORMAL LOW (ref >60)
Glucose, Bld: 71 mg/dL (ref 70–99)
Potassium: 4.2 mmol/L (ref 3.5–5.1)
Sodium: 134 mmol/L — ABNORMAL LOW (ref 135–145)

## 2023-09-18 LAB — POCT ACTIVATED CLOTTING TIME
Activated Clotting Time: 228 s
Activated Clotting Time: 187 s
Activated Clotting Time: 176 s

## 2023-09-18 LAB — GLUCOSE, CAPILLARY
Glucose-Capillary: 111 mg/dL — ABNORMAL HIGH (ref 70–99)
Glucose-Capillary: 136 mg/dL — ABNORMAL HIGH (ref 70–99)
Glucose-Capillary: 107 mg/dL — ABNORMAL HIGH (ref 70–99)
Glucose-Capillary: 123 mg/dL — ABNORMAL HIGH (ref 70–99)
Glucose-Capillary: 178 mg/dL — ABNORMAL HIGH (ref 70–99)

## 2023-09-18 LAB — MAGNESIUM: Magnesium: 2.2 mg/dL (ref 1.7–2.4)

## 2023-09-18 LAB — HEPARIN LEVEL (UNFRACTIONATED): Heparin Unfractionated: 0.42 [IU]/mL (ref 0.30–0.70)

## 2023-09-18 MED ORDER — ACETAMINOPHEN 325 MG PO TABS
ORAL_TABLET | ORAL | Status: AC
  Filled 2023-09-18: qty 2

## 2023-09-18 MED ORDER — FENTANYL CITRATE (PF) 100 MCG/2ML IJ SOLN
INTRAMUSCULAR | Status: AC
Start: 2023-09-18 — End: 2023-09-18
  Filled 2023-09-18: qty 2

## 2023-09-18 MED ORDER — TICAGRELOR 90 MG PO TABS: 180.0000 mg | ORAL_TABLET | Freq: Once | ORAL | Status: DC

## 2023-09-18 MED ORDER — ACETAMINOPHEN 325 MG PO TABS
650.0000 mg | ORAL_TABLET | ORAL | Status: DC | PRN
  Administered 2023-09-19 – 2023-09-20 (×3): 650 mg via ORAL
  Filled 2023-09-18 (×3): qty 2

## 2023-09-18 MED ORDER — MIDAZOLAM HCL 2 MG/2ML IJ SOLN
INTRAMUSCULAR | Status: AC
  Filled 2023-09-18: qty 2

## 2023-09-18 MED ORDER — FENTANYL CITRATE (PF) 100 MCG/2ML IJ SOLN
INTRAMUSCULAR | Status: DC | PRN
  Administered 2023-09-18 (×2): 25 ug via INTRAVENOUS

## 2023-09-18 MED ORDER — HEPARIN SODIUM (PORCINE) 1000 UNIT/ML IJ SOLN
INTRAMUSCULAR | Status: DC | PRN
  Administered 2023-09-18: 7500 [IU] via INTRAVENOUS

## 2023-09-18 MED ORDER — LIDOCAINE HCL (PF) 1 % IJ SOLN
INTRAMUSCULAR | Status: DC | PRN
  Administered 2023-09-18: 5 mL

## 2023-09-18 MED ORDER — SODIUM CHLORIDE 0.9% FLUSH
3.0000 mL | Freq: Two times a day (BID) | INTRAVENOUS | Status: DC
  Administered 2023-09-18 – 2023-09-19 (×2): 3 mL via INTRAVENOUS

## 2023-09-18 MED ORDER — FLUMAZENIL 1 MG/10ML IV SOLN
INTRAVENOUS | Status: DC | PRN
  Administered 2023-09-18: .2 mg via INTRAVENOUS

## 2023-09-18 MED ORDER — HYDRALAZINE HCL 20 MG/ML IJ SOLN
10.0000 mg | INTRAMUSCULAR | Status: AC | PRN
Start: 2023-09-18 — End: 2023-09-18

## 2023-09-18 MED ORDER — LABETALOL HCL 5 MG/ML IV SOLN: 10.0000 mg | INTRAVENOUS | Status: AC | PRN

## 2023-09-18 MED ORDER — HEPARIN SODIUM (PORCINE) 1000 UNIT/ML IJ SOLN
INTRAMUSCULAR | Status: AC
  Filled 2023-09-18: qty 10

## 2023-09-18 MED ORDER — MIDAZOLAM HCL 2 MG/2ML IJ SOLN
INTRAMUSCULAR | Status: DC | PRN
  Administered 2023-09-18 (×2): 1 mg via INTRAVENOUS

## 2023-09-18 MED ORDER — NALOXONE HCL 0.4 MG/ML IJ SOLN
INTRAMUSCULAR | Status: DC | PRN
Start: 2023-09-18 — End: 2023-09-18
  Administered 2023-09-18: .4 mg via INTRAVENOUS

## 2023-09-18 MED ORDER — NALOXONE HCL 0.4 MG/ML IJ SOLN
INTRAMUSCULAR | Status: AC
  Filled 2023-09-18: qty 1

## 2023-09-18 MED ORDER — SODIUM CHLORIDE 0.9 % IV SOLN: 250.0000 mL | INTRAVENOUS | Status: DC | PRN

## 2023-09-18 MED ORDER — VASOPRESSIN 20 UNITS/100 ML INFUSION FOR SHOCK
INTRAVENOUS | Status: AC
Start: 2023-09-18 — End: 2023-09-18
  Filled 2023-09-18: qty 100

## 2023-09-18 MED ORDER — LIDOCAINE HCL (PF) 1 % IJ SOLN
INTRAMUSCULAR | Status: AC
Start: 2023-09-18 — End: 2023-09-18
  Filled 2023-09-18: qty 30

## 2023-09-18 MED ORDER — SODIUM CHLORIDE 0.9% FLUSH: 3.0000 mL | INTRAVENOUS | Status: DC | PRN

## 2023-09-18 MED ORDER — HEPARIN SODIUM (PORCINE) 5000 UNIT/ML IJ SOLN
5000.0000 [IU] | Freq: Three times a day (TID) | INTRAMUSCULAR | Status: DC
  Administered 2023-09-19: 5000 [IU] via SUBCUTANEOUS
  Filled 2023-09-18: qty 1

## 2023-09-18 MED ORDER — CLOPIDOGREL BISULFATE 300 MG PO TABS
ORAL_TABLET | ORAL | Status: AC
  Filled 2023-09-18: qty 2

## 2023-09-18 MED FILL — Verapamil HCl IV Soln 2.5 MG/ML: INTRAVENOUS | Qty: 2 | Status: AC

## 2023-09-18 NOTE — TOC Progression Note (Signed)
 Transition of Care [facility]) - Progression Note    Patient Details  Name: Anna [name] MRN: [id number] Date of Birth: 23-Nov-1955  Transition of Care [facility] CM/SW Contact  Bridget Cordella Simmonds, LCSW Phone Number: 09/18/2023, 10:23 AM  Clinical Narrative:   PASSR still pending, requesting 30 day note, which was uploaded.    Expected Discharge Plan: Skilled Nursing Facility Barriers to Discharge: Continued Medical Work up, SNF Pending bed offer  Expected Discharge Plan and Services In-house Referral: Clinical Social Work Discharge Planning Services: CM Consult Post Acute Care Choice: Skilled Nursing Facility Living arrangements for the past 2 months: Apartment                 DME Arranged: N/A DME Agency: NA       HH Arranged:  (see note)           Social Determinants of Health (SDOH) Interventions SDOH Screenings   Food Insecurity: No Food Insecurity (09/10/2023)  Housing: High Risk (09/10/2023)  Transportation Needs: No Transportation Needs (09/10/2023)  Utilities: At Risk (09/10/2023)  Social Connections: Socially Isolated (09/10/2023)  Tobacco Use: Low Risk  (09/10/2023)    Readmission Risk Interventions    09/11/2023   11:53 AM  Readmission Risk Prevention Plan  Transportation Screening Complete  Medication Review (RN Care Manager) Referral to Pharmacy  PCP or Specialist appointment within 3-5 days of discharge Complete  HRI or Home Care Consult Complete

## 2023-09-18 NOTE — Progress Notes (Signed)
 Site area: right groin 47F arterial sheath Site Prior to Removal:  Level 0 Pressure Applied For: 35 minutes Manual:   yes Patient Status During Pull:  stable Post Pull Site:  Level 0 Post Pull Instructions Given:  yes Post Pull Pulses Present: rt dp 1+ Dressing Applied:  gauze and tegaderm Bedrest begins @ 1515 Comments:

## 2023-09-18 NOTE — Consult Note (Signed)
 Stroke Neurology Consultation Note  Consult Requested by: Dr. Elmira  Reason for Consult: acute mental status change, anisocoria  Consult Date: 09/18/23   The history was obtained from the chart and Dr. Elmira.  During history and examination, all items were able to obtain unless otherwise noted.  History of Present Illness:  Anna [name] is a 68 y.o. Caucasian female with PMH of PAD s/p L BKA, HTN, HLD, DM, DVT, remote GIB in 1984, COPD, right thalamic infarct in 05/2023 admitted for COPD exacerbation.  2D echo showed EF 40 to 45% and wall motion abnormalities in LCX territory. Troponin was also elevated. Underwent LHC on 09/13/2023 which showed severe multivessel CAD. CT surgery determined that she was not CABG candidate.  Pt went for repeat cardiac catheterization with atherectomy of LAD system today with Dr. Elmira. During the procedure, pt had heparin  infusion but also received two doses of fentanyl , after the second dose, pt had mental status change, with BP down to 60s. Medication reversed with narcan  and flumazenil, pt BP improved and mental status recovered. However, pt was found to have left pupil bigger than right. Code stroke was called. 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 Pt admitted that her left eye vision worsen than right eye. No focal deficit on neuro exam. Pt procedure aborted and stat CT no acute finding.   Patient admitted in 05/2023 after fall and hit her head.  She also complained she had left face and arm numbness-2 weeks prior to admission.  MRI showed right thalamic lacunar infarct, likely small vessel disease.  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  EF 60 to 65%, LDL 114, A1c 7.5.  Discharged on aspirin  81 and Brilinta  for 1 months and then back to aspirin  and Plavix .  Also increased Lipitor  from 40-80.  LSN: 11:40 AM TNK Given:  No: No focal deficit, on heparin  IV, left pupil larger than right likely chronic mRS = 2 IR: no, no focal deficit  Past Medical History:  Diagnosis Date   Anemia    Anxiety    Arthritis    Asthma    Back pain    Bronchitis    Bursitis    Chronic kidney disease    COPD (chronic obstructive pulmonary disease) (HCC)    Depression    Diabetes mellitus    Diabetic neuropathy (HCC)    Diabetic retinopathy    GERD (gastroesophageal reflux disease)    Hyperlipemia    Hypertension    Migraine    Obesity    Pneumonia    Sepsis due to urinary tract infection (HCC)    Sepsis secondary to UTI (HCC) 09/30/2022   Stroke (HCC)    2020    Past Surgical History:  Procedure Laterality Date   ABDOMINAL AORTOGRAM W/LOWER EXTREMITY Left 08/22/2021   Procedure: ABDOMINAL AORTOGRAM W/LOWER EXTREMITY;  Surgeon: Serene Gaile ORN, MD;  Location: [facility];  Service: Cardiovascular;  Laterality: Left;   AMPUTATION Left 10/11/2021   Procedure: LEFT BELOW KNEE AMPUTATION;  Surgeon: Harden Jerona GAILS, MD;  Location: [facility] OR;  Service: Orthopedics;  Laterality: Left;   CESAREAN SECTION     x3   CHOLECYSTECTOMY     CYSTOSCOPY WITH RETROGRADE PYELOGRAM, URETEROSCOPY AND STENT PLACEMENT Right 02/06/2023   Procedure: CYSTOSCOPY WITH RIGHT  RETROGRADE PYELOGRAM AND RIGHT URETERAL STENT EXCHANGE;  Surgeon: Devere Lonni Righter, MD;  Location: [facility];  Service: Urology;  Laterality: Right;  30 MINUTES   CYSTOSCOPY WITH STENT PLACEMENT Right 09/30/2022   Procedure: CYSTOSCOPY WITH STENT PLACEMENT;  Surgeon: Carolee Sherwood JONETTA DOUGLAS, MD;  Location: [facility] OR;  Service: Urology;  Laterality: Right;   EYE SURGERY Left    retina surgery had laser   LEFT HEART CATH AND CORONARY ANGIOGRAPHY N/A 09/13/2023   Procedure: LEFT HEART CATH AND CORONARY ANGIOGRAPHY;  Surgeon: Elmira Newman PARAS, MD;  Location: [facility];  Service: Cardiovascular;  Laterality: N/A;   METATARSAL HEAD EXCISION Left 08/24/2021    Procedure: METATARSAL HEAD EXCISION FIFTH TOE;  Surgeon: Gershon Donnice SAUNDERS, DPM;  Location: [facility] OR;  Service: Podiatry;  Laterality: Left;   PERIPHERAL VASCULAR BALLOON ANGIOPLASTY  08/22/2021   Procedure: PERIPHERAL VASCULAR BALLOON ANGIOPLASTY;  Surgeon: Serene Gaile ORN, MD;  Location: [facility];  Service: Cardiovascular;;  Left AT   WOUND DEBRIDEMENT Left 08/24/2021   Procedure: DEBRIDEMENT WOUND OF LEFT FOOT;  Surgeon: Gershon Donnice SAUNDERS, DPM;  Location: [facility] OR;  Service: Podiatry;  Laterality: Left;    History reviewed. No pertinent family history.  Social History:  reports that she has never smoked. She has never used smokeless tobacco. She reports that she does not drink alcohol and does not use drugs.  Allergies:  Allergies  Allergen Reactions   Lasix [Furosemide] Shortness Of Breath and Rash   Bacitracin Other (See Comments)    Burns skin     Ciprofloxacin  Swelling   Citalopram Diarrhea   Doxycycline  Swelling and Other (See Comments)    burning all over body     Latex Itching   Morphine And Codeine  Hives   Duloxetine Other (See Comments)    Mental Status Changes (intolerance) saw pictures of gun   Levaquin [Levofloxacin In D5w] Other (See Comments)    Pt does not remember reaction but states she's had issues with med   Salvia Officinalis Other (See Comments)    Sage- sneezing    Soma [Carisoprodol] Other (See Comments)    Sleepy and constipation      No current facility-administered medications on file prior to encounter.   Current Outpatient Medications on File Prior to Encounter  Medication Sig Dispense Refill   acetaminophen  (TYLENOL ) 500 MG tablet Take 1,000-1,500 mg by mouth every 8 (eight) hours as needed for moderate pain (pain score 4-6).     acidophilus (RISAQUAD) CAPS capsule Take 1 capsule by mouth daily.     albuterol  (VENTOLIN  HFA) 108 (90 Base) MCG/ACT inhaler Inhale 1-2 puffs into the lungs every 6 (six) hours as needed for wheezing  or shortness of breath. 18 g 0   aspirin  EC 81 MG tablet Take 81 mg by mouth daily. Swallow whole.     atorvastatin  (LIPITOR ) 80 MG tablet Take 1 tablet (80 mg total) by mouth at bedtime. 30 tablet 1   busPIRone  (BUSPAR ) 7.5 MG tablet Take 7.5 mg by mouth 2 (two) times daily.     Dulaglutide 3 MG/0.5ML SOPN Inject 3 mg into the skin every Saturday.     estradiol (ESTRACE) 0.1 MG/GM vaginal cream Place 1 Applicatorful vaginally as needed (dryness / irritation).     ferrous sulfate  325 (65 FE) MG tablet Take 325 mg by mouth daily with breakfast.     FLUoxetine  (PROZAC ) 40 MG capsule Take 40 mg by mouth daily.     fluticasone (FLONASE) 50 MCG/ACT nasal spray Place 1 spray into both nostrils daily as needed for  allergies or rhinitis.     gabapentin  (NEURONTIN ) 400 MG capsule Take 400 mg by mouth 3 (three) times daily.     hydrOXYzine  (ATARAX ) 10 MG tablet Take 1 tablet (10 mg total) by mouth 2 (two) times daily. (Patient taking differently: Take 10 mg by mouth every 8 (eight) hours as needed for anxiety.) 30 tablet 0   insulin  glargine, 1 Unit Dial , (TOUJEO  SOLOSTAR) 300 UNIT/ML Solostar Pen Inject 10 Units into the skin at bedtime. (Patient taking differently: Inject 25 Units into the skin at bedtime.) 1.5 mL 0   ipratropium-albuterol  (DUONEB) 0.5-2.5 (3) MG/3ML SOLN Use 3 ml by nebulization twice a day scheduled and every 4 hours as needed for shortness of breath and wheezing (Patient taking differently: Take 3 mLs by nebulization daily as needed (SOB and wheezing). Use 3 ml by nebulization twice a day scheduled and every 4 hours as needed for shortness of breath and wheezing) 360 mL 0   Lactobacillus Rhamnosus, GG, (CULTURELLE HEALTH & WELLNESS) CAPS Take 1 capsule by mouth daily.     lidocaine  4 % Place 1 patch onto the skin daily as needed (pain).     lisinopril  (ZESTRIL ) 5 MG tablet Take 5 mg by mouth at bedtime.     loratadine  (CLARITIN ) 10 MG tablet Take 10 mg by mouth daily as needed for  allergies.     LORazepam  (ATIVAN ) 0.5 MG tablet Take 0.5 mg by mouth daily as needed for anxiety.     meclizine (ANTIVERT) 25 MG tablet Take 25 mg by mouth daily as needed for dizziness.     Menthol , Topical Analgesic, (BIOFREEZE EX) Apply 1 Application topically daily as needed (neck and shoulder pain).     metFORMIN  (GLUCOPHAGE ) 1000 MG tablet Take 1,000 mg by mouth 2 (two) times daily with a meal.     nystatin  (MYCOSTATIN ) 100000 UNIT/ML suspension Take 1 mL by mouth 4 (four) times daily as needed (red gums).     nystatin  cream (MYCOSTATIN ) Apply 1 application. topically 4 (four) times daily as needed for dry skin.     ondansetron  (ZOFRAN -ODT) 4 MG disintegrating tablet Take 1 tablet (4 mg total) by mouth every 8 (eight) hours as needed for nausea or vomiting. 20 tablet 0   pantoprazole  (PROTONIX ) 40 MG tablet Take 40 mg by mouth 2 (two) times daily.     rOPINIRole  (REQUIP ) 0.25 MG tablet Take 0.25 mg by mouth at bedtime.     sennosides-docusate sodium  (SENOKOT-S) 8.6-50 MG tablet Take 1 tablet by mouth 2 (two) times daily as needed for constipation.     tamsulosin  (FLOMAX ) 0.4 MG CAPS capsule Take 1 capsule (0.4 mg total) by mouth daily. 30 capsule 0   topiramate  (TOPAMAX ) 50 MG tablet Take 1 tablet (50 mg total) by mouth 2 (two) times daily.     insulin  aspart (NOVOLOG ) 100 UNIT/ML FlexPen Use as directed before each meal 3 times a day: 140-199 - 2 units, 200-250 - 4 units, 251-299 - 6 units,  300-349 - 8 units,  350 or above 10 units. (Patient not taking: Reported on 09/10/2023) 15 mL 0    Review of Systems: A full ROS was attempted today and was able to be performed.  Systems assessed include - Constitutional, Eyes, HENT, Respiratory, Cardiovascular, Gastrointestinal, Genitourinary, Integument/breast, Hematologic/lymphatic, Musculoskeletal, Neurological, Behavioral/Psych, Endocrine, Allergic/Immunologic - with pertinent responses as per HPI.  Physical Examination: Temp:  [97 F (36.1  C)-99.1 F (37.3 C)] 97.8 F (36.6 C) (06/25 0830) Pulse Rate:  [0-106] 0 (  06/25 1224) Resp:  [14-49] 19 (06/25 1219) BP: (61-159)/(51-97) 100/68 (06/25 1224) SpO2:  [59 %-100 %] 92 % (06/25 1224) FiO2 (%):  [21 %] 21 % (06/24 2245) Weight:  [74.6 kg-77.1 kg] 74.6 kg (06/25 0500)  General - well nourished, well developed, intermittent acute pain from right groin catheter.    Ophthalmologic - fundi not visualized due to noncooperation.    Cardiovascular - regular rhythm and rate  Mental Status -  Level of arousal and orientation to time, place, and person were intact. Language including expression, naming, repetition, comprehension was assessed and found intact. Fund of Knowledge was assessed and was intact.  Cranial Nerves II - XII - II - Vision intact OU.  Left visual acuity decreased than right III, IV, VI - Extraocular movements intact.  Left pupil 3 mm, right pupil 2 mm, both sluggish to light. V - Facial sensation intact bilaterally. VII - Facial movement intact bilaterally. VIII - Hearing & vestibular intact bilaterally. X - Palate elevates symmetrically. XI - Chin turning & shoulder shrug intact bilaterally. XII - Tongue protrusion intact.  Motor Strength - The patient's strength was normal in all extremities and pronator drift was absent except left BKA.   Motor Tone & Bulk - Muscle tone was assessed at the neck and appendages and was normal.  Bulk was normal and fasciculations were absent.   Reflexes - The patient's reflexes were normal in all extremities and she had no pathological reflexes.  Sensory - Light touch, temperature/pinprick were assessed and were normal.    Coordination - The patient had normal movements in the hands with no ataxia or dysmetria.  Tremor was absent.  Gait and Station - deferred  NIHSS = 0   Data Reviewed: VAS US  LOWER EXTREMITY SAPHENOUS VEIN MAPPING Result Date: 09/16/2023 LOWER EXTREMITY VEIN MAPPING Patient Name:  ARCENIA SCARBRO  Date of Exam:   09/16/2023 Medical Rec #: [id number]         Accession #:    [id number] Date of Birth: 1956-02-22        Patient Gender: F Patient Age:   35 years Exam Location:  [facility] Procedure:      VAS US  LOWER EXTREMITY SAPHENOUS VEIN MAPPING Referring Phys: KYLA DONALD --------------------------------------------------------------------------------  Indications:  Pre-op Risk Factors: Hypertension, hyperlipidemia, Diabetes.  Performing Technologist: Elmarie Lindau, RVT  Examination Guidelines: A complete evaluation includes B-mode imaging, spectral Doppler, color Doppler, and power Doppler as needed of all accessible portions of each vessel. Bilateral testing is considered an integral part of a complete examination. Limited examinations for reoccurring indications may be performed as noted. +----------------+-----------+--------------+----------------+-----------+ RT Diameter (cm)RT Findings     GSV      LT Diameter (cm)LT Findings +----------------+-----------+--------------+----------------+-----------+       0.58                 Proximal thigh      0.43                  +----------------+-----------+--------------+----------------+-----------+       0.17       branching   Mid thigh                               +----------------+-----------+--------------+----------------+-----------+       0.15                  Distal thigh                             +----------------+-----------+--------------+----------------+-----------+  0.14                      Knee                                 +----------------+-----------+--------------+----------------+-----------+       0.16                   Prox calf                               +----------------+-----------+--------------+----------------+-----------+ Right Tech Comments: Patent right GSV with normal compression and no evidence of thrombus. The GSV is not seen distal to the prox calf area.  Left Tech Comments: Lt BKA Summary:  Right: Patent right great saphenous vein with no evidence of        thrombus.        The right great saphenous vein measures 0.14 to 0.58 cm.  *See table(s) above for measurements and observations. Diagnosing physician: Lonni Gaskins MD Electronically signed by Lonni Gaskins MD on 09/16/2023 at 2:47:09 PM.    Final    VAS US  CAROTID Result Date: 09/16/2023 Carotid Arterial Duplex Study Patient Name:  SHAKIERA EDELSON  Date of Exam:   09/16/2023 Medical Rec #: [id number]         Accession #:    [id number] Date of Birth: Aug 18, 1955        Patient Gender: F Patient Age:   12 years Exam Location:  [facility] Procedure:      VAS US  CAROTID Referring Phys: KYLA DONALD --------------------------------------------------------------------------------  Indications:   Bilateral bruits. Risk Factors:  Hypertension, hyperlipidemia, Diabetes, prior CVA. Other Factors: Hx of bilat 1-39% ICA stenosis. Performing Technologist: Elmarie Lindau, RVT  Examination Guidelines: A complete evaluation includes B-mode imaging, spectral Doppler, color Doppler, and power Doppler as needed of all accessible portions of each vessel. Bilateral testing is considered an integral part of a complete examination. Limited examinations for reoccurring indications may be performed as noted.  Right Carotid Findings: +----------+--------+--------+--------+------------------+------------------+           PSV cm/sEDV cm/sStenosisPlaque DescriptionComments           +----------+--------+--------+--------+------------------+------------------+ CCA Prox  59      12                                                   +----------+--------+--------+--------+------------------+------------------+ CCA Distal41      10              heterogenous                         +----------+--------+--------+--------+------------------+------------------+ ICA Prox  82      27                                 intimal thickening +----------+--------+--------+--------+------------------+------------------+ ICA Distal77      22                                                   +----------+--------+--------+--------+------------------+------------------+  ECA       50                                                           +----------+--------+--------+--------+------------------+------------------+ +----------+--------+-------+----------------+-------------------+           PSV cm/sEDV cmsDescribe        Arm Pressure (mmHG) +----------+--------+-------+----------------+-------------------+ [id number]            Multiphasic, WNL                    +----------+--------+-------+----------------+-------------------+ +---------+--------+--+--------+--+---------+ VertebralPSV cm/s38EDV cm/s12Antegrade +---------+--------+--+--------+--+---------+  Left Carotid Findings: +----------+--------+--------+--------+------------------+--------+           PSV cm/sEDV cm/sStenosisPlaque DescriptionComments +----------+--------+--------+--------+------------------+--------+ CCA Prox  72      20                                         +----------+--------+--------+--------+------------------+--------+ CCA Distal72      19              heterogenous               +----------+--------+--------+--------+------------------+--------+ ICA Prox  66      17              heterogenous               +----------+--------+--------+--------+------------------+--------+ ICA Distal103     31                                         +----------+--------+--------+--------+------------------+--------+ ECA       124                                                +----------+--------+--------+--------+------------------+--------+ +----------+--------+--------+----------------+-------------------+           PSV cm/sEDV cm/sDescribe        Arm Pressure (mmHG)  +----------+--------+--------+----------------+-------------------+ [id number]              Multiphasic, WNL                    +----------+--------+--------+----------------+-------------------+ +---------+--------+--+--------+-+---------+ VertebralPSV cm/s34EDV cm/s8Antegrade +---------+--------+--+--------+-+---------+   Summary: Right Carotid: Velocities in the right ICA are consistent with a 1-39% stenosis. Left Carotid: Velocities in the left ICA are consistent with a 1-39% stenosis. Vertebrals:  Bilateral vertebral arteries demonstrate antegrade flow. Subclavians: Normal flow hemodynamics were seen in bilateral subclavian              arteries. *See table(s) above for measurements and observations.  Electronically signed by Lonni Gaskins MD on 09/16/2023 at 2:46:52 PM.    Final    CARDIAC CATHETERIZATION Result Date: 09/13/2023 Images from the original result were not included. Coronary angiography 09/13/2023: LM: No significant disease LAD: Prox diffuse 70% disease with severe calcification (Severity could be underestimated given the diffuse nature of the disease)          Mid 90% stenosis          Diag 2 with prox diffuse  80% disease Lcx: No significant disease RCA: Prox 100% occlusion with severe prox-mid calcification          Left-to-tight collaterals from LAD to RPDA          RPDA diffuse disease at least 60% LVEDP 12 mmHg Severe multivessel disease in a diabetic patient. Presentation more likely chronic coronary syndrome than true ACS. Anatomically, she has good targets for surgical revascularization in at least LAD and diag, RPDA target is questionable. Recommend surgical consult for at least a discussion regarding CABG.  If she is not deemed to be a surgical candidate at this time, could consider medical management for CAD and GDMT for HFrEF, and consider CABG in the near future.  However, if she is deemed not a surgical candidate due to all her other comorbidities, including left  BKA, COPD, we could consider PCI options.  While her focal stenosis is in mid LAD, her diffuse disease in proximal and mid LAD is likely physiologically significant.  She would require atherectomy for optimal percutaneous revascularization to this LAD, also bearing in mind the possibility of requiring 2 stenting strategy given severe diffuse disease in a large diagonal 2.  Overall, percutaneous revascularization options are complex and high risk.  Other option could be medical management alone given her presentation of rather chronic coronary syndrome rather than ACS. Newman JINNY Lawrence, MD   CT HEAD WO CONTRAST ( ) Result Date: 09/12/2023 EXAM: CT HEAD WITHOUT CONTRAST 09/12/2023 10:32:00 PM TECHNIQUE: CT of the head was performed without the administration of intravenous contrast. Automated exposure control, iterative reconstruction, and/or weight based adjustment of the mA/kV was utilized to reduce the radiation dose to as low as reasonably achievable. COMPARISON: MRI brain dated 06/02/2023. CLINICAL HISTORY: Head trauma, minor, normal mental status (Age 82-64y); hospital fall. FINDINGS: BRAIN AND VENTRICLES: There is no acute intracranial hemorrhage, mass effect or midline shift. No abnormal extra-axial fluid collection. The gray-white differentiation is maintained without an acute infarct. There is no hydrocephalus. Global cortical atrophy. Subcortical and periventricular small vessel ischemic changes. Intracranial atherosclerosis. ORBITS: The visualized portion of the orbits demonstrate no acute abnormality. SINUSES: The visualized paranasal sinuses and mastoid air cells demonstrate no acute abnormality. SOFT TISSUES AND SKULL: No acute abnormality of the visualized skull or soft tissues. IMPRESSION: 1. No acute intracranial abnormality. 2. Atrophy with small vessel ischemic changes. Electronically signed by: Pinkie Pebbles MD 09/12/2023 10:35 PM EDT RP Workstation: [id number]   ECHOCARDIOGRAM  COMPLETE Result Date: 09/11/2023    ECHOCARDIOGRAM REPORT   Patient Name:   IOLANI TWILLEY Meritt Date of Exam: 09/11/2023 Medical Rec #:  [id number]        Height:       62.0 in Accession #:    [id number]       Weight:       160.0 lb Date of Birth:  17-Dec-1955       BSA:          1.739 m Patient Age:    67 years         BP:           132/79 mmHg Patient Gender: F                HR:           99 bpm. Exam Location:  Inpatient Procedure: 2D Echo, Intracardiac Opacification Agent, Cardiac Doppler and Color            Doppler (Both Spectral and Color Flow Doppler were utilized  during            procedure). Indications:    dyspnea  History:        Patient has prior history of Echocardiogram examinations, most                 recent 06/03/2023.  Sonographer:    Therisa Crouch Referring Phys: [phone] [facility]  Sonographer Comments: Technically difficult study due to poor echo windows. IMPRESSIONS  1. No apical thrombus with Definity  contrast. Left ventricular ejection fraction, by estimation, is 40 to 45%. The left ventricle has mildly decreased function. The left ventricle demonstrates regional wall motion abnormalities (see scoring diagram/findings for description). Left ventricular diastolic parameters are consistent with Grade I diastolic dysfunction (impaired relaxation). There is severe hypokinesis of the left ventricular, entire inferior wall, anterior segment, inferolateral wall and lateral wall. The apical lateral wall is hyperdynamic.  2. Right ventricular systolic function is moderately reduced. The right ventricular size is normal. There is normal pulmonary artery systolic pressure. The estimated right ventricular systolic pressure is 30.2 mmHg.  3. The mitral valve is grossly normal. Trivial mitral valve regurgitation.  4. The aortic valve was not well visualized. Aortic valve regurgitation is not visualized. No aortic stenosis is present.  5. The inferior vena cava is dilated in size with <50% respiratory  variability, suggesting right atrial pressure of 15 mmHg. Comparison(s): Changes from prior study are noted. 06/03/2023: LVEF 60-65%. Conclusion(s)/Recommendation(s): Findings suggest probable LCX territory ischemia/infarct. FINDINGS  Left Ventricle: No apical thrombus with Definity  contrast. Left ventricular ejection fraction, by estimation, is 40 to 45%. The left ventricle has mildly decreased function. The left ventricle demonstrates regional wall motion abnormalities. Severe hypokinesis of the left ventricular, entire inferior wall, anterior segment, inferolateral wall and lateral wall. The left ventricular internal cavity size was normal in size. There is no left ventricular hypertrophy. Left ventricular diastolic parameters are consistent with Grade I diastolic dysfunction (impaired relaxation). Indeterminate filling pressures.  LV Wall Scoring: The entire lateral wall is hypokinetic. The apical lateral wall is hyperdynamic. Right Ventricle: The right ventricular size is normal. No increase in right ventricular wall thickness. Right ventricular systolic function is moderately reduced. There is normal pulmonary artery systolic pressure. The tricuspid regurgitant velocity is 1.95 m/s, and with an assumed right atrial pressure of 15 mmHg, the estimated right ventricular systolic pressure is 30.2 mmHg. Left Atrium: Left atrial size was normal in size. Right Atrium: Right atrial size was normal in size. Pericardium: There is no evidence of pericardial effusion. Mitral Valve: The mitral valve is grossly normal. Trivial mitral valve regurgitation. Tricuspid Valve: The tricuspid valve is grossly normal. Tricuspid valve regurgitation is mild. Aortic Valve: The aortic valve was not well visualized. Aortic valve regurgitation is not visualized. No aortic stenosis is present. Pulmonic Valve: The pulmonic valve was normal in structure. Pulmonic valve regurgitation is not visualized. Aorta: The aortic root and ascending  aorta are structurally normal, with no evidence of dilitation. Venous: The inferior vena cava is dilated in size with less than 50% respiratory variability, suggesting right atrial pressure of 15 mmHg. IAS/Shunts: No atrial level shunt detected by color flow Doppler.   LV Volumes (MOD) LV vol d, MOD A2C: 88.0 ml Diastology LV vol d, MOD A4C: 93.5 ml LV e' medial:   7.46 cm/s LV vol s, MOD A2C: 48.2 ml LV E/e' medial: 12.6 LV vol s, MOD A4C: 30.2 ml LV SV MOD A2C:     39.8 ml LV SV MOD  A4C:     93.5 ml LV SV MOD BP:      52.0 ml RIGHT VENTRICLE            IVC RV S prime:     7.00 cm/s  IVC diam: 2.10 cm TAPSE (M-mode): 1.2 cm LEFT ATRIUM             Index LA Vol (A2C):   59.2 ml 34.05 ml/m LA Vol (A4C):   46.8 ml 26.92 ml/m LA Biplane Vol: 53.2 ml 30.60 ml/m  MITRAL VALVE                TRICUSPID VALVE MV Area (PHT): 6.07 cm     TR Peak grad:   15.2 mmHg MV Decel Time: 125 msec     TR Vmax:        195.00 cm/s MV E velocity: 93.80 cm/s MV A velocity: 101.00 cm/s MV E/A ratio:  0.93 Vinie Maxcy MD Electronically signed by Vinie Maxcy MD Signature Date/Time: 09/11/2023/4:47:49 PM    Final    CT Angio Chest Pulmonary Embolism (PE) W or WO Contrast Result Date: 09/11/2023 CLINICAL DATA:  Cough, dyspnea, recent COVID EXAM: CT ANGIOGRAPHY CHEST WITH CONTRAST TECHNIQUE: Multidetector CT imaging of the chest was performed using the standard protocol during bolus administration of intravenous contrast. Multiplanar CT image reconstructions and MIPs were obtained to evaluate the vascular anatomy. RADIATION DOSE REDUCTION: This exam was performed according to the departmental dose-optimization program which includes automated exposure control, adjustment of the mA and/or kV according to patient size and/or use of iterative reconstruction technique. CONTRAST:  60mL OMNIPAQUE  IOHEXOL  350 MG/ML SOLN COMPARISON:  09/09/2023, 09/30/2022 FINDINGS: Cardiovascular: This is a technically adequate evaluation of the pulmonary  vasculature. No filling defects or pulmonary emboli. The heart is unremarkable without pericardial effusion. Normal caliber of the thoracic aorta. Atherosclerosis of the aorta and coronary vasculature. Mediastinum/Nodes: Fluid-filled distal thoracic esophagus may be due to reflux. Thyroid and trachea are unremarkable. Borderline enlarged mediastinal lymph nodes are identified, measuring up to 11 mm in the right paratracheal region and 15 mm in the subcarinal region, likely reactive. Lungs/Pleura: Small bilateral pleural effusions, estimated less than 500 cc each. Minimal scattered linear and ground-glass opacities, greatest in the mid and lower lung zones, compatible with given history of recent COVID infection. No dense consolidation or pneumothorax. 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 Upper Abdomen: There is trace free fluid in the right upper quadrant with mild periportal edema, new since prior abdominal CT 08/08/2023. This is nonspecific. Musculoskeletal: No acute or destructive bony abnormalities. Reconstructed images demonstrate no additional findings. Review of the MIP images confirms the above findings. IMPRESSION: 1. No evidence of pulmonary embolus. 2. Scattered basilar predominant interstitial and ground-glass opacities compatible with given history of recent COVID pneumonia. 3. Small bilateral pleural effusions. 4. Borderline enlarged mediastinal adenopathy, likely reactive. 5. Nonspecific mild periportal edema and trace free fluid right upper quadrant, new since prior abdominal CT. 6. Aortic Atherosclerosis (ICD10-I70.0). Coronary artery atherosclerosis. 7. Findings suggesting underlying tracheobronchomalacia. Electronically Signed   By: Ozell Daring M.D.   On: 09/11/2023 15:01   VAS US  LOWER EXTREMITY VENOUS (DVT) (7a-7p) Result Date: 09/10/2023  Lower Venous DVT Study Patient Name:  FALLON HAECKER  Date of  Exam:   09/10/2023 Medical Rec #: [id number]         Accession #:    [id number] Date of Birth: 1955-09-13  Patient Gender: F Patient Age:   83 years Exam Location:  [facility] Procedure:      VAS US  LOWER EXTREMITY VENOUS (DVT) Referring Phys: TIMOTHY OPYD --------------------------------------------------------------------------------  Indications: Pain, Swelling, and Right foot. Other Indications: Diabetes, left BKA. Comparison Study: No priors. Performing Technologist: Ricka Sturdivant-Jones RDMS, RVT  Examination Guidelines: A complete evaluation includes B-mode imaging, spectral Doppler, color Doppler, and power Doppler as needed of all accessible portions of each vessel. Bilateral testing is considered an integral part of a complete examination. Limited examinations for reoccurring indications may be performed as noted. The reflux portion of the exam is performed with the patient in reverse Trendelenburg.  +---------+---------------+---------+-----------+----------+--------------+ RIGHT    CompressibilityPhasicitySpontaneityPropertiesThrombus Aging +---------+---------------+---------+-----------+----------+--------------+ CFV      Full           Yes      Yes                                 +---------+---------------+---------+-----------+----------+--------------+ SFJ      Full                                                        +---------+---------------+---------+-----------+----------+--------------+ FV Prox  Full                                                        +---------+---------------+---------+-----------+----------+--------------+ FV Mid   Full                                                        +---------+---------------+---------+-----------+----------+--------------+ FV DistalFull                                                        +---------+---------------+---------+-----------+----------+--------------+ PFV      Full                                                         +---------+---------------+---------+-----------+----------+--------------+ POP      Full           Yes      Yes                                 +---------+---------------+---------+-----------+----------+--------------+ PTV      Full                                                        +---------+---------------+---------+-----------+----------+--------------+  PERO     Full                                                        +---------+---------------+---------+-----------+----------+--------------+   +----+---------------+---------+-----------+----------+--------------+ LEFTCompressibilityPhasicitySpontaneityPropertiesThrombus Aging +----+---------------+---------+-----------+----------+--------------+ CFV Full           Yes      Yes                                 +----+---------------+---------+-----------+----------+--------------+ SFJ Full                                                        +----+---------------+---------+-----------+----------+--------------+    Summary: RIGHT: - There is no evidence of deep vein thrombosis in the lower extremity.  - No cystic structure found in the popliteal fossa.  LEFT: - No evidence of common femoral vein obstruction.   *See table(s) above for measurements and observations. Electronically signed by Debby Robertson on 09/10/2023 at 4:30:57 PM.    Final    DG Chest 2 View Result Date: 09/09/2023 EXAM: 2 VIEW(S) XRAY OF THE CHEST 09/09/2023 07:55:00 PM COMPARISON: 08/08/2023 CLINICAL HISTORY: Cough. SOB, cough for over 2 weeks. History of COPD. FINDINGS: LUNGS AND PLEURA: No focal pulmonary opacity. No pulmonary edema. No pleural effusion. No pneumothorax. HEART AND MEDIASTINUM: No acute abnormality of the cardiac and mediastinal silhouettes. BONES AND SOFT TISSUES: Mild degenerative changes of the mid/lower thoracic spine. Cholecystectomy clips. No acute osseous  abnormality. IMPRESSION: 1. No acute process. Electronically signed by: Pinkie Pebbles MD 09/09/2023 08:02 PM EDT RP Workstation: [id number]    Assessment: 68 y.o. female with PMH of PAD s/p L BKA, HTN, HLD, DM, DVT, remote GIB in 1984, COPD, right thalamic infarct in 05/2023 admitted for COPD exacerbation.  During repeat cardiac catheterization with atherectomy of LAD system today she received two doses of fentanyl , developed mental status change and profound hypotension. Medication reversed with narcan  and flumazenil, pt BP improved and mental status recovered. However, pt was found to have left pupil bigger than right. Code stroke was called. 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 Pt admitted that her left eye vision worsen than right eye. No focal deficit on neuro exam. Pt procedure aborted and stat CT no acute finding.  Patient not a TNK candidate given no focal deficit, on heparin  IV, pupil larger on the left likely due to optic neuropathy.  Will close neurocheck.  If neurochanges will consider further stroke workup with MRI and CTA.  Plan: - Close neurocheck  - If neurochanges will consider further stroke workup with MRI and CTA - will follow - Discussed with Dr. Elmira.   Thank you for this consultation and allowing us  to participate in the care of this patient.  Ary Cummins, MD PhD Stroke Neurology 09/18/2023 6:06 PM

## 2023-09-18 NOTE — Progress Notes (Signed)
 Progress Note  Patient Name: Anna [name] Date of Encounter: 09/18/2023 [facility] Cardiologist: Oneil Parchment, MD   Interval Summary   Comfortable. No SOB laying flat.   Vital Signs Vitals:   09/17/23 2030 09/18/23 0045 09/18/23 0500 09/18/23 0517  BP:  98/61  (!) 90/51  Pulse:  68    Resp: 18 16  18   Temp: 98.1 F (36.7 C) 97.7 F (36.5 C)  97.8 F (36.6 C)  TempSrc: Oral   Oral  SpO2: 100% 100%  99%  Weight:   74.6 kg   Height:        Intake/Output Summary (Last 24 hours) at 09/18/2023 0718 Last data filed at 09/18/2023 0559 Gross per 24 hour  Intake 1420 ml  Output 2700 ml  Net -1280 ml      09/18/2023    5:00 AM 09/17/2023    4:48 PM 09/17/2023    4:51 AM  Last 3 Weights  Weight (lbs) 164 lb 7.4 oz 169 lb 15.6 oz 163 lb 9.3 oz  Weight (kg) 74.6 kg 77.1 kg 74.2 kg      Telemetry/ECG  Frequent PVC's, trigemimy - Personally Reviewed  Physical Exam  GEN: No acute distress.   Neck: No JVD Cardiac: Occasional ectopy, RRR, no murmurs, rubs, or gallops.  Respiratory: Clear to auscultation bilaterally. GI: Soft, nontender, non-distended  MS: No edema, left BKA  Assessment & Plan  68 y.o. female with a history of PAD s/p left below knee amputation, hypertension, hyperlipidemia, type 2 diabetes mellitus, CVA in 05/2023, prior DVT, remote GI bleed in 1984 who was admitted on 09/09/2023 for a COPD exacerbation after presenting with cough and shortness of breath. Echo showed mildly reduced EF with wall motion abnormalities in LCX territory. Troponin was then checked and came back elevated. Therefore, Cardiology was consulted. She underwent LHC on 09/13/2023 which showed severe multivessel CAD. CT surgery was consulted, determined she was not CABG candidate therefore she was scheduled for repeat cardiac catheterization with atherectomy of LAD system today, 6/25.   NSTEMI Severe multivessel CAD Hyperlipidemia Presented with intermittent chest pain at rest/with  exertion Troponin 385  Echo showed EF 40-45%, hypokinesis of entire inferior wall, anterior segment, inferolateral wall, and lateral wall  LHC showed severe multivessel disease CT surgery was consulted and determined that she is not a candidate for CABG Continue IV heparin  Continue ASA 81 mg daily Continue Toprol  25 mg daily Continue Lipitor  80 mg daily  Scheduled for cardiac catheterization with atherectomy of LAD system today NPO since midnight  Discussed with Dr. Ritchie. Discussed risks of death, bleeding, etc. Urology note reviewed. There appears to be no rush to remove ureteral stents. This should allow for adequate DAPT. Spoke to [place] daughter at length. All questions answered.  Creat 1.1, Hgb stable at 9.6   Newly diagnosed HFmrEF Hypertension  Echo showed LVEF of 40-45% with wall motion abnormalities as described above and grade 1 diastolic dysfunction as well as moderately reduced RV function  BNP 1,360 > 964 LVEDP on cath was normal, 12 mmHg Underwent diuresis with IV Bumex   Net - 9 L this admission  Most recent BP 98/61, HR 60s Appears euvolemic on exam  Creatinine trending down, 1.13 today  Continue Losartan  25 mg daily Continue Toprol  25 mg daily Continue spironolactone  25 mg daily Deferred SGL2Ti given history of UTIs    PAD S/p left BKA Continue ASA and statin as above Followed by vascular surgery as outpatient    Per primary  COPD Cough CKD T2DM, neuropathy Anemia  GERD CVA Chronic back pain    For questions or updates, please contact [facility] Please consult [email or web address] for contact info under       Signed, Oneil Parchment, MD

## 2023-09-18 NOTE — Progress Notes (Signed)
 PROGRESS NOTE   Anna [name]  FMW:[id number]    DOB: 1956/03/06    DOA: 09/09/2023  PCP: Cloria Annabella CROME, DO   I have briefly reviewed patients previous medical records in [facility].   Brief Hospital Course:   68 year old F with PMH of COPD, CVA, IDDM-2, left BKA, HTN, anxiety, depression and reported COVID-19 infection about 2 and half weeks prior for which she was treated with Paxlovid  presents with shortness of breath and cough, and admitted with COPD exacerbation.   In ED, she was slightly tachycardic and tachypneic. Cr 1.19.  WBC 11.4.  Hgb 9.8.  Platelet 484.  UA with large LE.  CXR without acute finding.  A 20 pathogen RVP nonreactive.  Patient was started on Solu-Medrol , ceftriaxone  and nebulizers and admitted.   CT angio chest negative for PE but scattered basilar predominant interstitial and groundglass opacities compatible with given history of recent COVID-19 pneumonia, borderline enlarged mediastinal adenopathy likely reactive and nonspecific mild periportal edema.  TTE with LVEF of 40 to 45%, RWMA, G1 DD and RVSP of 30 mmHg.  BNP elevated to 1300.  Troponin elevated to 385.  Cardiology consulted.   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  CTS consulted.  Not felt candidate for CABG and hence planned for cardiac cath and stenting.  During the procedure, patient had AMS, hypotension down to the 60s, noted to have anisocoria, code stroke activated and neurology consulted.   COVID-19 swab nonreactive.  Respiratory symptoms improving.   Assessment & Plan:   NSTEMI Severe multivessel CAD Presented with chest pain.  Troponin up to 385 TTE showed LVEF of 40-45% with wall motion abnormalities LHC showed severe multivessel disease Cardiothoracic surgery consulted and they did not feel that she was a CABG candidate. Treated with IV heparin , aspirin , Toprol , Lipitor . Cardiac cath with atherectomy of LAD was attempted on 6/25 but procedure had to be aborted due to complications:  AMS, stopped breathing briefly, hypotension with BP in the 60s, asymmetric pupils, likely related to multiple minutes during procedure, appears that code stroke was activated, CT head without acute findings, neurology consulted, appears to have returned to her baseline.  Hyperlipidemia Continue Lipitor   HFmrEF Echo results as above BNP 1360 Underwent diuresis with IV Bumex .  Clinically euvolemic. Continue GDMT including losartan , Toprol , spironolactone .  No SGLT2 given history of UTIs  PAD s/p left BKA Continue aspirin  and statin.  Outpatient follow-up with vascular surgery  Asthma exacerbation/tracheomalacia/GERD/postviral from recent COVID-19 infection Workup as noted in Dr. Dela note from 6/24 Completed IV steroids Continue Brovana , Pulmicort , Yupelri, DuoNebs, PPI. Improved  Type II DM/IDDM with hyperglycemia Continue current dose of Semglee  and SSI, gabapentin .  Good control  Acute kidney injury complicating stage IIIa CKD Baseline creatinine 1.1 Suspected due to hypotensive episode and LHC Creatinine had peaked to 1.47 now down to 1.13  Anemia Stable.  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  They feel that the stent is in good position and functioning well and they do not favor OR stent exchange this hospitalization given her acute cardiovascular and pulmonary issues. Outpatient follow-up in 6 weeks with Dr. Devere.  Body mass index is 30.08 kg/m.   DVT prophylaxis: heparin  injection 5,000 Units Start: 09/19/23 0600 SCD's Start: 09/18/23 1557     Code Status: Full Code:  Family Communication: Granddaughter at bedside prior to procedure Disposition:  Status is: Inpatient Remains inpatient appropriate because: NSTEMI evaluation including cardiac cath  as noted above.  Await cardiology follow-up tomorrow     Consultants:   Cardiology Neurology Urology  Procedures:   As  above  Subjective:  Seen this morning prior to procedure.  Patient had no complaints.  Specifically had no chest pain or dyspnea.  Was awaiting cardiac catheter on 12:30 PM.  Objective:   Vitals:   09/18/23 1500 09/18/23 1550 09/18/23 1621 09/18/23 1711  BP: 100/66 103/61    Pulse: 73   78  Resp: 13     Temp:   98.7 F (37.1 C)   TempSrc:   Oral   SpO2: 99% 98%    Weight:      Height:        General exam: Middle-age female, looks older than stated age, moderately built and nourished lying comfortably propped up in bed without distress. Respiratory system: Clear to auscultation. Respiratory effort normal. Cardiovascular system: S1 & S2 heard, RRR. No JVD, murmurs, rubs, gallops or clicks. No pedal edema.  Telemetry personally reviewed: Sinus rhythm. Gastrointestinal system: Abdomen is nondistended, soft and nontender. No organomegaly or masses felt. Normal bowel sounds heard. Central nervous system: Alert and oriented. No focal neurological deficits. Extremities: Symmetric 5 x 5 power.  Healed left BKA stump. Skin: No rashes, lesions or ulcers Psychiatry: Judgement and insight appear normal. Mood & affect appropriate.     Data Reviewed:   I have personally reviewed following labs and imaging studies   CBC: Recent Labs  Lab 09/16/23 0500 09/17/23 0457 09/18/23 0416  WBC 15.3* 12.9* 13.8*  HGB 9.3* 9.7* 9.6*  HCT 30.5* 31.5* 31.6*  MCV 91.9 90.3 93.8  PLT 405* 404* 380    Basic Metabolic Panel: Recent Labs  Lab 09/14/23 1139 09/15/23 0830 09/16/23 0500 09/17/23 0457 09/18/23 0416  NA 136 137 139 134* 134*  K 4.2 4.3 4.2 4.2 4.2  CL 103 105 103 104 106  CO2 21* 23 22 24  17*  GLUCOSE 184* 116* 82 90 71  BUN 30* 33* 31* 24* 20  CREATININE 1.47* 1.26* 1.40* 1.29* 1.13*  CALCIUM  8.4* 8.6* 8.6* 8.1* 7.9*  MG 2.3 2.2 2.2 2.2 2.2    Liver Function Tests: No results for input(s): AST, ALT, ALKPHOS, BILITOT, PROT, ALBUMIN in the last 168  hours.  CBG: Recent Labs  Lab 09/18/23 1209 09/18/23 1340 09/18/23 1620  GLUCAP 136* 107* 123*    Microbiology Studies:   Recent Results (from the past 240 hours)  Urine Culture     Status: Abnormal   Collection Time: 09/09/23 11:36 PM   Specimen: Urine, Random  Result Value Ref Range Status   Specimen Description URINE, RANDOM  Final   Special Requests   Final    NONE Reflexed from [id number] Performed at [facility], [address], [place], [place] [address]    Culture MULTIPLE SPECIES PRESENT, SUGGEST RECOLLECTION (A)  Final   Report Status 09/10/2023 FINAL  Final  Respiratory (~20 pathogens) panel by PCR     Status: None   Collection Time: 09/10/23  8:41 AM   Specimen: Anterior Nasal Swab; Respiratory  Result Value Ref Range Status   Adenovirus NOT DETECTED NOT DETECTED Final   Coronavirus 229E NOT DETECTED NOT DETECTED Final    Comment: (NOTE) The Coronavirus on the Respiratory Panel, DOES NOT test for the novel  Coronavirus (2019 nCoV)    Coronavirus HKU1 NOT DETECTED NOT DETECTED Final   Coronavirus NL63 NOT DETECTED NOT DETECTED Final   Coronavirus OC43 NOT DETECTED  NOT DETECTED Final   Metapneumovirus NOT DETECTED NOT DETECTED Final   Rhinovirus / Enterovirus NOT DETECTED NOT DETECTED Final   Influenza A NOT DETECTED NOT DETECTED Final   Influenza B NOT DETECTED NOT DETECTED Final   Parainfluenza Virus 1 NOT DETECTED NOT DETECTED Final   Parainfluenza Virus 2 NOT DETECTED NOT DETECTED Final   Parainfluenza Virus 3 NOT DETECTED NOT DETECTED Final   Parainfluenza Virus 4 NOT DETECTED NOT DETECTED Final   Respiratory Syncytial Virus NOT DETECTED NOT DETECTED Final   Bordetella pertussis NOT DETECTED NOT DETECTED Final   Bordetella Parapertussis NOT DETECTED NOT DETECTED Final   Chlamydophila pneumoniae NOT DETECTED NOT DETECTED Final   Mycoplasma pneumoniae NOT DETECTED NOT DETECTED Final    Comment: Performed at [facility], [address]  [place], [place] [address]  SARS Coronavirus 2 by RT PCR (hospital order, performed in [facility] hospital lab) *cepheid single result test* Anterior Nasal Swab     Status: None   Collection Time: 09/13/23  8:08 AM   Specimen: Anterior Nasal Swab  Result Value Ref Range Status   SARS Coronavirus 2 by RT PCR NEGATIVE NEGATIVE Final    Comment: Performed at [facility], [address] [place], [place] [address]    Radiology Studies:  CARDIAC CATHETERIZATION Result Date: 09/18/2023 Aborted procedure, see above for details. Sheath sutured in place. Newman JINNY Lawrence, MD  CT HEAD CODE STROKE WO CONTRAST Result Date: 09/18/2023 CLINICAL DATA:  Code stroke. Neuro deficit, concern for stroke, uneven pupils. EXAM: CT HEAD WITHOUT CONTRAST TECHNIQUE: Contiguous axial images were obtained from the base of the skull through the vertex without intravenous contrast. RADIATION DOSE REDUCTION: This exam was performed according to the departmental dose-optimization program which includes automated exposure control, adjustment of the mA and/or kV according to patient size and/or use of iterative reconstruction technique. COMPARISON:  MRI head 06/02/2023.  CT head 09/12/2023. FINDINGS: Brain: No acute intracranial hemorrhage. No CT evidence of acute infarct. No edema, mass effect, or midline shift. The basilar cisterns are patent. Nonspecific hypoattenuation in the periventricular and subcortical white matter favored to reflect chronic microvascular ischemic changes. 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 Ventricles: The ventricles are normal. Vascular: Atherosclerotic calcifications of the carotid siphons and intracranial vertebral arteries. No hyperdense vessel. Skull: No acute or aggressive finding. Orbits: Orbits are symmetric. Sinuses: Mucosal thickening in the bilateral sphenoid sinuses. Other: Mastoid air cells are clear. ASPECTS [facility] Stroke Program Early CT Score) - Ganglionic level infarction  (caudate, lentiform nuclei, internal capsule, insula, M1-M3 cortex): 7 - Supraganglionic infarction (M4-M6 cortex): 3 Total score (0-10 with 10 being normal): 10 IMPRESSION: 1. No CT evidence of acute intracranial abnormality. 2. Chronic microvascular ischemic changes. 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 3. Mucosal thickening and possible air-fluid levels in the sphenoid sinuses. Recommend correlation for acute sinusitis. 4. ASPECTS is 10 These results were communicated to Dr. Jerri at 12:50 pm on 09/18/2023 by text page via the [facility] messaging system. Electronically Signed   By: Donnice Mania M.D.   On: 09/18/2023 12:50    Scheduled Meds:    arformoterol   15 mcg Nebulization BID   aspirin  EC  81 mg Oral Daily   atorvastatin   80 mg Oral QHS   benzonatate   100 mg Oral TID   budesonide  (PULMICORT ) nebulizer solution  0.5 mg Nebulization BID   busPIRone   7.5 mg Oral BID   dextromethorphan   30 mg Oral BID  ferrous sulfate   325 mg Oral Q breakfast   FLUoxetine   40 mg Oral Daily   gabapentin   400 mg Oral TID   guaiFENesin   600 mg Oral BID   [START ON 09/19/2023] heparin   5,000 Units Subcutaneous Q8H   insulin  aspart  0-20 Units Subcutaneous TID WC   insulin  aspart  0-5 Units Subcutaneous QHS   insulin  aspart  3 Units Subcutaneous TID WC   insulin  glargine-yfgn  15 Units Subcutaneous Daily   loratadine   10 mg Oral Daily   losartan   25 mg Oral QPM   metoprolol  succinate  25 mg Oral Daily   nystatin    Topical BID   pantoprazole   40 mg Oral BID   revefenacin  175 mcg Nebulization Daily   rOPINIRole   0.25 mg Oral QHS   senna-docusate  2 tablet Oral BID   sodium chloride  flush  3 mL Intravenous Q12H   spironolactone   25 mg Oral Daily   tamsulosin   0.4 mg Oral Daily   topiramate   50 mg Oral BID    Continuous Infusions:    sodium chloride        LOS: 8 days     Trenda Mar, MD,  FACP, [facility], [facility], [facility]   [facility] Physician Advisor [facility]      To contact the attending provider between 7A-7P or the covering provider during after hours 7P-7A, please log into the web site [email or web address] and access using universal [place] password for that web site. If you do not have the password, please call the hospital operator.  09/18/2023, 7:41 PM

## 2023-09-18 NOTE — Code Documentation (Signed)
 Anna [name] is a 68 yr old female in [facility] preparing for Coronary Atherectomy. Pt was last known well at 1140 today, and became confused shortly thereafter. Staff noted anisocoria, and code stroke was activated. Pt has been on Heparin  infusion. Pt examined by Stroke Team and procedure was stopped. Pt taken to CT by Stroke Team. NIHSS 0. The following imaging was completed: CT head. Per Dr. Jerri, CT was negative for hemorrhage. As pt has no fixed neurological deficit, code stroke was cancelled. Staff advised to check pupils q 4 hr. Handoff to cath lab RN by RRN complete.

## 2023-09-18 NOTE — Plan of Care (Signed)
   Problem: Fluid Volume: Goal: Ability to maintain a balanced intake and output will improve Outcome: Progressing   Problem: Metabolic: Goal: Ability to maintain appropriate glucose levels will improve Outcome: Progressing   Problem: Tissue Perfusion: Goal: Adequacy of tissue perfusion will improve Outcome: Progressing

## 2023-09-18 NOTE — Progress Notes (Signed)
 PHARMACY - ANTICOAGULATION CONSULT NOTE  Pharmacy Consult for heparin  Indication: multivessel CAD  Allergies  Allergen Reactions   Lasix [Furosemide] Shortness Of Breath and Rash   Bacitracin Other (See Comments)    Burns skin     Ciprofloxacin  Swelling   Citalopram Diarrhea   Doxycycline  Swelling and Other (See Comments)    burning all over body     Latex Itching   Morphine And Codeine  Hives   Duloxetine Other (See Comments)    Mental Status Changes (intolerance) saw pictures of gun   Levaquin [Levofloxacin In D5w] Other (See Comments)    Pt does not remember reaction but states she's had issues with med   Salvia Officinalis Other (See Comments)    Sage- sneezing    Soma [Carisoprodol] Other (See Comments)    Sleepy and constipation      Patient Measurements: Height: 5' 2 (157.5 cm) Weight: 74.6 kg (164 lb 7.4 oz) IBW/kg (Calculated) : 50.1 HEPARIN  DW (KG): 67.4  Vital Signs: Temp: 97.8 F (36.6 C) (06/25 0517) Temp Source: Oral (06/25 0517) BP: 90/51 (06/25 0517) Pulse Rate: 68 (06/25 0045)  Labs: Recent Labs    09/16/23 0500 09/17/23 0457 09/17/23 1730 09/18/23 0416  HGB 9.3* 9.7*  --  9.6*  HCT 30.5* 31.5*  --  31.6*  PLT 405* 404*  --  380  HEPARINUNFRC 0.47 0.24* 0.53 0.42  CREATININE 1.40* 1.29*  --  1.13*    Estimated Creatinine Clearance: 45.1 mL/min (A) (by C-G formula based on SCr of 1.13 mg/dL (H)).   Assessment: 41 yoF admitted with PNA found to have elevated troponins. Pharmacy consulted for IV heparin  dosing. Pt s/p cath 6/20 which shows multivessel CAD. TCTS consulted. Heparin  resumed while finalizing revascularization plan.  Heparin  level 0.42 is therapeutic on 1100 units/hr. Hgb stable.  Goal of Therapy:  Heparin  level 0.3-0.7 units/ml Monitor platelets by anticoagulation protocol: Yes   Plan:  Continue heparin  infusion at 1100 units/hr Monitor daily heparin  level, CBC, signs/symptoms of bleeding  F/u revascularization  plan  Thank you for allowing pharmacy to participate in this patient's care,  Jinnie Door, PharmD, BCPS, [facility] Clinical Pharmacist  Please check AMION for all [facility] Pharmacy phone numbers After 10:00 PM, call Main Pharmacy [phone]

## 2023-09-18 NOTE — H&P (View-Only) (Signed)
 Progress Note  Patient Name: Anna [name] Date of Encounter: 09/18/2023 [facility] Cardiologist: Oneil Parchment, MD   Interval Summary   Comfortable. No SOB laying flat.   Vital Signs Vitals:   09/17/23 2030 09/18/23 0045 09/18/23 0500 09/18/23 0517  BP:  98/61  (!) 90/51  Pulse:  68    Resp: 18 16  18   Temp: 98.1 F (36.7 C) 97.7 F (36.5 C)  97.8 F (36.6 C)  TempSrc: Oral   Oral  SpO2: 100% 100%  99%  Weight:   74.6 kg   Height:        Intake/Output Summary (Last 24 hours) at 09/18/2023 0718 Last data filed at 09/18/2023 0559 Gross per 24 hour  Intake 1420 ml  Output 2700 ml  Net -1280 ml      09/18/2023    5:00 AM 09/17/2023    4:48 PM 09/17/2023    4:51 AM  Last 3 Weights  Weight (lbs) 164 lb 7.4 oz 169 lb 15.6 oz 163 lb 9.3 oz  Weight (kg) 74.6 kg 77.1 kg 74.2 kg      Telemetry/ECG  Frequent PVC's, trigemimy - Personally Reviewed  Physical Exam  GEN: No acute distress.   Neck: No JVD Cardiac: Occasional ectopy, RRR, no murmurs, rubs, or gallops.  Respiratory: Clear to auscultation bilaterally. GI: Soft, nontender, non-distended  MS: No edema, left BKA  Assessment & Plan  68 y.o. female with a history of PAD s/p left below knee amputation, hypertension, hyperlipidemia, type 2 diabetes mellitus, CVA in 05/2023, prior DVT, remote GI bleed in 1984 who was admitted on 09/09/2023 for a COPD exacerbation after presenting with cough and shortness of breath. Echo showed mildly reduced EF with wall motion abnormalities in LCX territory. Troponin was then checked and came back elevated. Therefore, Cardiology was consulted. She underwent LHC on 09/13/2023 which showed severe multivessel CAD. CT surgery was consulted, determined she was not CABG candidate therefore she was scheduled for repeat cardiac catheterization with atherectomy of LAD system today, 6/25.   NSTEMI Severe multivessel CAD Hyperlipidemia Presented with intermittent chest pain at rest/with  exertion Troponin 385  Echo showed EF 40-45%, hypokinesis of entire inferior wall, anterior segment, inferolateral wall, and lateral wall  LHC showed severe multivessel disease CT surgery was consulted and determined that she is not a candidate for CABG Continue IV heparin  Continue ASA 81 mg daily Continue Toprol  25 mg daily Continue Lipitor  80 mg daily  Scheduled for cardiac catheterization with atherectomy of LAD system today NPO since midnight  Discussed with Dr. Ritchie. Discussed risks of death, bleeding, etc. Urology note reviewed. There appears to be no rush to remove ureteral stents. This should allow for adequate DAPT. Spoke to [place] daughter at length. All questions answered.  Creat 1.1, Hgb stable at 9.6   Newly diagnosed HFmrEF Hypertension  Echo showed LVEF of 40-45% with wall motion abnormalities as described above and grade 1 diastolic dysfunction as well as moderately reduced RV function  BNP 1,360 > 964 LVEDP on cath was normal, 12 mmHg Underwent diuresis with IV Bumex   Net - 9 L this admission  Most recent BP 98/61, HR 60s Appears euvolemic on exam  Creatinine trending down, 1.13 today  Continue Losartan  25 mg daily Continue Toprol  25 mg daily Continue spironolactone  25 mg daily Deferred SGL2Ti given history of UTIs    PAD S/p left BKA Continue ASA and statin as above Followed by vascular surgery as outpatient    Per primary  COPD Cough CKD T2DM, neuropathy Anemia  GERD CVA Chronic back pain    For questions or updates, please contact [facility] Please consult [email or web address] for contact info under       Signed, Oneil Parchment, MD

## 2023-09-18 NOTE — Progress Notes (Signed)
   Heart Failure Stewardship Pharmacist Progress Note   PCP: Cloria Annabella CROME, DO PCP-Cardiologist: Oneil Parchment, MD    HPI:  88 YOF with PMH of HTN, DM, COPD, CVA, depression, anxiety, s/p BKA. Recent self-reported COVID infection 2 1/2 weeks ago.  Presented to ED with cough and dyspnea. Received molnupiravir, symptoms initially improved then worsened, then given doxy. CXR showed no acute finding.  ECHO showed LVEF 40-45% (previously 60-65% 05/2023), with mildly decreased function, grade I diastolic dysfunction, severe hypokinesis. Right ventricle systolic function is moderately reduced.  L heart cath showed LAD Prox diffuse 70% stenosis, Mid 90% stenosis, Diag 2 with prox diffuse 80% stenosis. RCA Prox 100% occlusion with severe prox-mid calcification, left-to-right collaterals from LAD to RPDA. If not surgical candidate, medical management alone may be sufficient.  Per CT surgery, not candidate for CABG. Atherectomy planned for today, 6/25.  Attempted to see patient before procedure but was being prepped as I got to room. Will follow up tomorrow.   Current HF Medications: Beta Blocker: metoprolol  succ 25 mg daily ACE/ARB/ARNI: losartan  25 mg QPM MRA: spironolactone  25 mg daily  Prior to admission HF Medications: ACE/ARB/ARNI: lisinopril  5 mg daily  Pertinent Lab Values: Serum creatinine 1.40>1.29>1.13, BUN 20, Potassium 4.2, Sodium 134, BNP 964, Mg 2.2,  A1c 7.5 (05/2023)  Vital Signs: Weight: 164 lbs today. Admission weight: 160 lbs - incomplete - bed weights.  Blood pressure: 90's/60's Heart rate: 79 I/O: net -1.18L yesterday; net -9L since admission  Medication Assistance / Insurance Benefits Check: Does the patient have prescription insurance?  Yes Type of insurance plan: [employer]   Outpatient Pharmacy:  Prior to admission outpatient pharmacy: [facility] [place] [place] - [address] Is the patient willing to use [facility] TOC pharmacy at discharge?  Pending Is the patient willing to transition their outpatient pharmacy to utilize a [facility] outpatient pharmacy?   No   Assessment: 1. 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 NYHA class III symptoms. - Continue spironolactone  25 mg daily.  - Continue metoprolol  succ 25 mg daily - Continue losartan  25 mg daily - Daily weight. Strict I/O's - Keep K > 4, Mg > 2.    Plan: 1) Medication changes recommended at this time: - Continue to monitor BP and assess for symptoms of hypotension - Avoid SGLTi - history of chronic UTI  2) Patient assistance: - Has insurance with [employer]   3)  Education  - Initial education provided. - To be completed prior to discharge  Bernardino George, PharmD Candidate [employer] 09/18/2023 2:52 PM

## 2023-09-18 NOTE — Interval H&P Note (Signed)
 History and Physical Interval Note:  09/18/2023 10:50 AM  Anna [name]  has presented today for surgery, with the diagnosis of cad.  The various methods of treatment have been discussed with the patient and family. After consideration of risks, benefits and other options for treatment, the patient has consented to  Procedure(s): CORONARY ATHERECTOMY (N/A) as a surgical intervention.  The patient's history has been reviewed, patient examined, no change in status, stable for surgery.  I have reviewed the patient's chart and labs.  Questions were answered to the patient's satisfaction.     Kameisha Malicki J Chimere Klingensmith

## 2023-09-18 NOTE — Plan of Care (Signed)
  Problem: Clinical Measurements: Goal: Respiratory complications will improve Outcome: Progressing Goal: Cardiovascular complication will be avoided Outcome: Progressing   Problem: Activity: Goal: Risk for activity intolerance will decrease Outcome: Progressing   Problem: Safety: Goal: Ability to remain free from injury will improve Outcome: Progressing   Problem: Activity: Goal: Ability to tolerate increased activity will improve Outcome: Progressing   Problem: Activity: Goal: Ability to return to baseline activity level will improve Outcome: Progressing   Problem: Cardiovascular: Goal: Ability to achieve and maintain adequate cardiovascular perfusion will improve Outcome: Progressing

## 2023-09-18 NOTE — Interval H&P Note (Signed)
 History and Physical Interval Note:  09/18/2023 11:09 AM  Anna [name]  has presented today for surgery, with the diagnosis of cad.  The various methods of treatment have been discussed with the patient and family. After consideration of risks, benefits and other options for treatment, the patient has consented to  Procedure(s): CORONARY ATHERECTOMY (N/A) Coronary Ultrasound/IVUS (N/A) as a surgical intervention.  The patient's history has been reviewed, patient examined, no change in status, stable for surgery.  I have reviewed the patient's chart and labs.  Questions were answered to the patient's satisfaction.     Brae Gartman J Gabriellia Rempel

## 2023-09-19 DIAGNOSIS — Z515 Encounter for palliative care: Secondary | ICD-10-CM

## 2023-09-19 DIAGNOSIS — I5020 Unspecified systolic (congestive) heart failure: Secondary | ICD-10-CM

## 2023-09-19 DIAGNOSIS — I25112 Atherosclerotic heart disease of native coronary artery with refractory angina pectoris: Secondary | ICD-10-CM

## 2023-09-19 DIAGNOSIS — I959 Hypotension, unspecified: Secondary | ICD-10-CM | POA: Diagnosis not present

## 2023-09-19 DIAGNOSIS — I214 Non-ST elevation (NSTEMI) myocardial infarction: Secondary | ICD-10-CM | POA: Diagnosis not present

## 2023-09-19 DIAGNOSIS — H579 Unspecified disorder of eye and adnexa: Secondary | ICD-10-CM | POA: Diagnosis not present

## 2023-09-19 DIAGNOSIS — Z8673 Personal history of transient ischemic attack (TIA), and cerebral infarction without residual deficits: Secondary | ICD-10-CM | POA: Diagnosis not present

## 2023-09-19 DIAGNOSIS — J441 Chronic obstructive pulmonary disease with (acute) exacerbation: Secondary | ICD-10-CM | POA: Diagnosis not present

## 2023-09-19 LAB — GLUCOSE, CAPILLARY
Glucose-Capillary: 89 mg/dL (ref 70–99)
Glucose-Capillary: 97 mg/dL (ref 70–99)
Glucose-Capillary: 101 mg/dL — ABNORMAL HIGH (ref 70–99)
Glucose-Capillary: 134 mg/dL — ABNORMAL HIGH (ref 70–99)

## 2023-09-19 LAB — CBC
HCT: 30.7 % — ABNORMAL LOW (ref 36.0–46.0)
Hemoglobin: 9.1 g/dL — ABNORMAL LOW (ref 12.0–15.0)
MCH: 27.6 pg (ref 26.0–34.0)
MCHC: 29.6 g/dL — ABNORMAL LOW (ref 30.0–36.0)
MCV: 93 fL (ref 80.0–100.0)
Platelets: 351 10*3/uL (ref 150–400)
RBC: 3.3 MIL/uL — ABNORMAL LOW (ref 3.87–5.11)
RDW: 15.6 % — ABNORMAL HIGH (ref 11.5–15.5)
WBC: 9.3 10*3/uL (ref 4.0–10.5)
nRBC: 0 % (ref 0.0–0.2)

## 2023-09-19 LAB — BASIC METABOLIC PANEL WITH GFR
Anion gap: 5 (ref 5–15)
BUN: 20 mg/dL (ref 8–23)
CO2: 24 mmol/L (ref 22–32)
Calcium: 8.2 mg/dL — ABNORMAL LOW (ref 8.9–10.3)
Chloride: 105 mmol/L (ref 98–111)
Creatinine, Ser: 1.13 mg/dL — ABNORMAL HIGH (ref 0.44–1.00)
GFR, Estimated: 53 mL/min — ABNORMAL LOW (ref >60)
Glucose, Bld: 103 mg/dL — ABNORMAL HIGH (ref 70–99)
Potassium: 4.3 mmol/L (ref 3.5–5.1)
Sodium: 134 mmol/L — ABNORMAL LOW (ref 135–145)

## 2023-09-19 LAB — MAGNESIUM: Magnesium: 2.3 mg/dL (ref 1.7–2.4)

## 2023-09-19 LAB — HEPARIN LEVEL (UNFRACTIONATED): Heparin Unfractionated: <0.1 [IU]/mL — ABNORMAL LOW (ref 0.30–0.70)

## 2023-09-19 MED ORDER — ENOXAPARIN SODIUM 40 MG/0.4ML IJ SOSY
40.0000 mg | PREFILLED_SYRINGE | Freq: Every day | INTRAMUSCULAR | Status: DC
  Administered 2023-09-19 – 2023-09-23 (×5): 40 mg via SUBCUTANEOUS
  Filled 2023-09-19 (×5): qty 0.4

## 2023-09-19 MED ORDER — INSULIN GLARGINE-YFGN 100 UNIT/ML ~~LOC~~ SOLN
12.0000 [IU] | Freq: Every day | SUBCUTANEOUS | Status: DC
  Administered 2023-09-20 – 2023-09-22 (×3): 12 [IU] via SUBCUTANEOUS
  Filled 2023-09-19 (×4): qty 0.12

## 2023-09-19 NOTE — Progress Notes (Signed)
 PROGRESS NOTE   Anna [name]  FMW:[id number]    DOB: 15-Dec-1955    DOA: 09/09/2023  PCP: Anna Annabella CROME, DO   I have briefly reviewed patients previous medical records in [facility].   Brief Hospital Course:   68 year old F with PMH of COPD, CVA, IDDM-2, left BKA, HTN, anxiety, depression and reported COVID-19 infection about 2 and half weeks prior for which she was treated with Paxlovid  presents with shortness of breath and cough, and admitted with COPD exacerbation.   In ED, she was slightly tachycardic and tachypneic. Cr 1.19.  WBC 11.4.  Hgb 9.8.  Platelet 484.  UA with large LE.  CXR without acute finding.  A 20 pathogen RVP nonreactive.  Patient was started on Solu-Medrol , ceftriaxone  and nebulizers and admitted. COVID-19 swab nonreactive.   CT angio chest negative for PE but scattered basilar predominant interstitial and groundglass opacities compatible with given history of recent COVID-19 pneumonia, borderline enlarged mediastinal adenopathy likely reactive and nonspecific mild periportal edema.  TTE with LVEF of 40 to 45%, RWMA, G1 DD and RVSP of 30 mmHg.  BNP elevated to 1300.  Troponin elevated to 385.  Cardiology consulted.   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  CTS consulted.  Not felt candidate for CABG.  Cardiology unsuccessfully attempted atherectomy of LAD on 6/25, procedure had to be aborted due to complications from periprocedural polypharmacy.  She has since recovered but does not wish to proceed with any aggressive interventions.  Palliative care has been consulted for goals of care and are trying to arrange a family meeting.    Assessment & Plan:   NSTEMI Severe multivessel CAD Presented with chest pain.  Troponin up to 385 TTE showed LVEF of 40-45% with wall motion abnormalities LHC showed severe multivessel disease Cardiothoracic surgery consulted and they did not feel that she was a CABG candidate. Treated with IV heparin , aspirin , Toprol ,  Lipitor . Cardiac cath with atherectomy of LAD was attempted on 6/25 but procedure had to be aborted due to complications: AMS, stopped breathing briefly, hypotension with BP in the 60s, asymmetric pupils, likely related to multiple medications during procedure, appears that code stroke was activated, CT head without acute findings, neurology consulted, appears to have returned to her baseline. Patient does not wish to proceed with any aggressive interventions.  Cardiology recommended palliative care input-they were consulted and are trying to arrange a family meeting. Per cardiology, continue medical management of NSTEMI with aspirin  81 Mg daily, atorvastatin  80 Mg daily, Toprol -XL 25 Mg daily.  Spironolactone  and losartan  were discontinued due to soft blood pressures.  Cardiology signed off 6/26.  Hyperlipidemia Continue Lipitor   HFmrEF Echo results as above BNP 1360 Underwent diuresis with IV Bumex .  Clinically euvolemic. Continue GDMT as above.  PAD s/p left BKA Continue aspirin  and statin.  Outpatient follow-up with vascular surgery  Asthma exacerbation/tracheomalacia/GERD/postviral from recent COVID-19 infection Workup as noted in Dr. Dela note from 6/24 Completed IV steroids Continue Brovana , Pulmicort , Yupelri, DuoNebs, PPI. Improved  Type II DM/IDDM with hyperglycemia Continue current dose of Semglee  and SSI, gabapentin .  It inpatient glycemic control, cutting back on Semglee .  Acute kidney injury complicating stage IIIa CKD Baseline creatinine 1.1 Suspected due to hypotensive episode and LHC Creatinine had peaked to 1.47 now down to 1.13  Anemia Stable.  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  They feel that the stent is in good position and functioning well and  they do not favor OR stent exchange this hospitalization given her acute cardiovascular and pulmonary issues. Outpatient follow-up  in 6 weeks with Dr. Devere.  Body mass index is 31.9 kg/m.   DVT prophylaxis: enoxaparin  (LOVENOX ) injection 40 mg Start: 09/19/23 1215 SCD's Start: 09/18/23 1557     Code Status: Full Code:  Family Communication: Next granddaughter, son at bedside. Disposition:  Awaiting palliative care team input to address goals of care with family     Consultants:   Cardiology Neurology Urology  Procedures:   As above  Subjective:  Patient reports that she feels more alert today.  However intermittently during the discussion, appears to still be somewhat lethargic at times and repeating the same thing again and again.  Denies chest pain or dyspnea.  Also keeps stating that one of the medications caused her to have visual hallucinations and drowsiness and wishes to cut the dose or stop but unable to identify which 1 it is.  Advised her to have nursing contact MD when she figures out which medication it is.  Objective:   Vitals:   09/19/23 0906 09/19/23 1223 09/19/23 1300 09/19/23 1632  BP:  107/85 (!) 78/66 (!) 91/55  Pulse:      Resp:    16  Temp:      TempSrc:  Oral  Oral  SpO2: 100% 100%  94%  Weight:      Height:        General exam: Middle-age female, looks older than stated age, moderately built and nourished lying comfortably propped up in bed without distress. Respiratory system: Clear to auscultation.  No increased work of breathing. Cardiovascular system: S1 & S2 heard, RRR. No JVD, murmurs, rubs, gallops or clicks. No pedal edema.  Telemetry personally reviewed: Sinus rhythm.  Occasional ventricular bigeminy. Gastrointestinal system: Abdomen is nondistended, soft and nontender. No organomegaly or masses felt. Normal bowel sounds heard. Central nervous system: Alert and oriented.  Rest of mental status as noted above.  No focal neurological deficits. Extremities: Symmetric 5 x 5 power.  Healed left BKA stump. Skin: No rashes, lesions or ulcers Psychiatry: Judgement and  insight appear normal. Mood & affect appropriate.     Data Reviewed:   I have personally reviewed following labs and imaging studies   CBC: Recent Labs  Lab 09/17/23 0457 09/18/23 0416 09/19/23 0428  WBC 12.9* 13.8* 9.3  HGB 9.7* 9.6* 9.1*  HCT 31.5* 31.6* 30.7*  MCV 90.3 93.8 93.0  PLT 404* 380 351    Basic Metabolic Panel: Recent Labs  Lab 09/15/23 0830 09/16/23 0500 09/17/23 0457 09/18/23 0416 09/19/23 0428  NA 137 139 134* 134* 134*  K 4.3 4.2 4.2 4.2 4.3  CL 105 103 104 106 105  CO2 23 22 24  17* 24  GLUCOSE 116* 82 90 71 103*  BUN 33* 31* 24* 20 20  CREATININE 1.26* 1.40* 1.29* 1.13* 1.13*  CALCIUM  8.6* 8.6* 8.1* 7.9* 8.2*  MG 2.2 2.2 2.2 2.2 2.3    Liver Function Tests: No results for input(s): AST, ALT, ALKPHOS, BILITOT, PROT, ALBUMIN in the last 168 hours.  CBG: Recent Labs  Lab 09/19/23 0806 09/19/23 1216 09/19/23 1636  GLUCAP 89 97 101*    Microbiology Studies:   Recent Results (from the past 240 hours)  Urine Culture     Status: Abnormal   Collection Time: 09/09/23 11:36 PM   Specimen: Urine, Random  Result Value Ref Range Status   Specimen Description URINE, RANDOM  Final  Special Requests   Final    NONE Reflexed from [id number] Performed at [facility] Lab, [address], [place], [place] [address]    Culture MULTIPLE SPECIES PRESENT, SUGGEST RECOLLECTION (A)  Final   Report Status 09/10/2023 FINAL  Final  Respiratory (~20 pathogens) panel by PCR     Status: None   Collection Time: 09/10/23  8:41 AM   Specimen: Anterior Nasal Swab; Respiratory  Result Value Ref Range Status   Adenovirus NOT DETECTED NOT DETECTED Final   Coronavirus 229E NOT DETECTED NOT DETECTED Final    Comment: (NOTE) The Coronavirus on the Respiratory Panel, DOES NOT test for the novel  Coronavirus (2019 nCoV)    Coronavirus HKU1 NOT DETECTED NOT DETECTED Final   Coronavirus NL63 NOT DETECTED NOT DETECTED Final   Coronavirus OC43 NOT DETECTED  NOT DETECTED Final   Metapneumovirus NOT DETECTED NOT DETECTED Final   Rhinovirus / Enterovirus NOT DETECTED NOT DETECTED Final   Influenza A NOT DETECTED NOT DETECTED Final   Influenza B NOT DETECTED NOT DETECTED Final   Parainfluenza Virus 1 NOT DETECTED NOT DETECTED Final   Parainfluenza Virus 2 NOT DETECTED NOT DETECTED Final   Parainfluenza Virus 3 NOT DETECTED NOT DETECTED Final   Parainfluenza Virus 4 NOT DETECTED NOT DETECTED Final   Respiratory Syncytial Virus NOT DETECTED NOT DETECTED Final   Bordetella pertussis NOT DETECTED NOT DETECTED Final   Bordetella Parapertussis NOT DETECTED NOT DETECTED Final   Chlamydophila pneumoniae NOT DETECTED NOT DETECTED Final   Mycoplasma pneumoniae NOT DETECTED NOT DETECTED Final    Comment: Performed at [facility], [address], [place], [place] [address]  SARS Coronavirus 2 by RT PCR (hospital order, performed in [facility] hospital lab) *cepheid single result test* Anterior Nasal Swab     Status: None   Collection Time: 09/13/23  8:08 AM   Specimen: Anterior Nasal Swab  Result Value Ref Range Status   SARS Coronavirus 2 by RT PCR NEGATIVE NEGATIVE Final    Comment: Performed at [facility], [address], [place], [place] [address]    Radiology Studies:  CARDIAC CATHETERIZATION Result Date: 09/18/2023 Aborted procedure, see above for details. Sheath sutured in place. Newman JINNY Lawrence, MD  CT HEAD CODE STROKE WO CONTRAST Result Date: 09/18/2023 CLINICAL DATA:  Code stroke. Neuro deficit, concern for stroke, uneven pupils. EXAM: CT HEAD WITHOUT CONTRAST TECHNIQUE: Contiguous axial images were obtained from the base of the skull through the vertex without intravenous contrast. RADIATION DOSE REDUCTION: This exam was performed according to the departmental dose-optimization program which includes automated exposure control, adjustment of the mA and/or kV according to patient size and/or use of iterative reconstruction  technique. COMPARISON:  MRI head 06/02/2023.  CT head 09/12/2023. FINDINGS: Brain: No acute intracranial hemorrhage. No CT evidence of acute infarct. No edema, mass effect, or midline shift. The basilar cisterns are patent. Nonspecific hypoattenuation in the periventricular and subcortical white matter favored to reflect chronic microvascular ischemic changes. 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 Ventricles: The ventricles are normal. Vascular: Atherosclerotic calcifications of the carotid siphons and intracranial vertebral arteries. No hyperdense vessel. Skull: No acute or aggressive finding. Orbits: Orbits are symmetric. Sinuses: Mucosal thickening in the bilateral sphenoid sinuses. Other: Mastoid air cells are clear. ASPECTS [facility] Stroke Program Early CT Score) - Ganglionic level infarction (caudate, lentiform nuclei, internal capsule, insula, M1-M3 cortex): 7 - Supraganglionic infarction (M4-M6 cortex): 3 Total score (0-10 with 10 being normal): 10 IMPRESSION:  1. No CT evidence of acute intracranial abnormality. 2. Chronic microvascular ischemic changes. 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 3. Mucosal thickening and possible air-fluid levels in the sphenoid sinuses. Recommend correlation for acute sinusitis. 4. ASPECTS is 10 These results were communicated to Dr. Jerri at 12:50 pm on 09/18/2023 by text page via the [facility] messaging system. Electronically Signed   By: Donnice Mania M.D.   On: 09/18/2023 12:50    Scheduled Meds:    arformoterol   15 mcg Nebulization BID   aspirin  EC  81 mg Oral Daily   atorvastatin   80 mg Oral QHS   benzonatate   100 mg Oral TID   budesonide  (PULMICORT ) nebulizer solution  0.5 mg Nebulization BID   busPIRone   7.5 mg Oral BID   dextromethorphan   30 mg Oral BID   enoxaparin  (LOVENOX ) injection  40 mg Subcutaneous Daily   ferrous sulfate   325 mg Oral Q breakfast   FLUoxetine   40 mg Oral Daily   gabapentin   400 mg Oral TID   guaiFENesin   600 mg  Oral BID   insulin  aspart  0-20 Units Subcutaneous TID WC   insulin  aspart  0-5 Units Subcutaneous QHS   insulin  aspart  3 Units Subcutaneous TID WC   [START ON 09/20/2023] insulin  glargine-yfgn  12 Units Subcutaneous Daily   loratadine   10 mg Oral Daily   metoprolol  succinate  25 mg Oral Daily   nystatin    Topical BID   pantoprazole   40 mg Oral BID   revefenacin  175 mcg Nebulization Daily   rOPINIRole   0.25 mg Oral QHS   senna-docusate  2 tablet Oral BID   sodium chloride  flush  3 mL Intravenous Q12H   tamsulosin   0.4 mg Oral Daily   topiramate   50 mg Oral BID    Continuous Infusions:       LOS: 9 days     Trenda Mar, MD,  FACP, [facility], [facility], [facility]   [facility] [place]      To contact the attending provider between 7A-7P or the covering provider during after hours 7P-7A, please log into the web site [email or web address] and access using universal [place] password for that web site. If you do not have the password, please call the hospital operator.  09/19/2023, 5:10 PM

## 2023-09-19 NOTE — Progress Notes (Signed)
 Physical Therapy Treatment Patient Details Name: Anna [name] MRN: [id number] DOB: 07-12-1955 Today's Date: 09/19/2023   History of Present Illness Anna [name] is a 68 y.o. female admitted 09/09/23 for COPD exacerbation. She reports a COVID-19 infection about 2.5 weeks prior which was treated with Paxlovid . LHC on 6/20 demonstrated multivessel CAD. Plan for CABG once pulmonary status improves. PMHx: CAD, HFrEF, COPD, CVA, IDDM-2, PAD s/p left BKA, HTN, anxiety, depression, OSA on CPAP, asthma/chronic bronchitis, and obesity.    PT Comments  Overnight pt requiring Narcan  due to increased narcotics for catheterization. Pt continues to be groggy this morning requiring increased stimulation to stay awake. Pt requiring modA for bed mobility. OOB mobility deferred due to low BP(see general comments) and incontinence of urine requiring linen change and cleaning up patient. D/c plans remain appropriate at this time. PT will continue to follow acutely.    If plan is discharge home, recommend the following: A lot of help with walking and/or transfers;A lot of help with bathing/dressing/bathroom;Assistance with cooking/housework;Assist for transportation;Help with stairs or ramp for entrance   Can travel by private vehicle     No  Equipment Recommendations   (TBD)       Precautions / Restrictions Precautions Precautions: Fall Recall of Precautions/Restrictions: Intact Restrictions Weight Bearing Restrictions Per Provider Order: No     Mobility  Bed Mobility Overal bed mobility: Needs Assistance Bed Mobility: Rolling, Sidelying to Sit, Sit to Supine Rolling: Used rails, Min assist Sidelying to sit: Used rails, Mod assist, HOB elevated   Sit to supine: Used rails, Min assist   General bed mobility comments: pt lethargic today after increased narcotic yesterday, requiring increased stimulation to stay engaged. Pt requires min A for rolling and modA for bringing trunk to upright. Pt  found to be saturated in urine. Given pt lethargy, pt returned to supine with min A, Where pt cleaned and linens changed.    Transfers                   General transfer comment: deferred due to lethargy    Ambulation/Gait               General Gait Details: NT         Balance Overall balance assessment: Needs assistance Sitting-balance support: Single extremity supported, No upper extremity supported, Feet supported Sitting balance-Leahy Scale: Fair     Standing balance support: Reliant on assistive device for balance, Single extremity supported Standing balance-Leahy Scale: Poor                              Communication Communication Communication: Impaired Factors Affecting Communication: Reduced clarity of speech  Cognition Arousal: Lethargic Behavior During Therapy: WFL for tasks assessed/performed, Lability   PT - Cognitive impairments: No apparent impairments, Orientation, Sequencing, Problem solving   Orientation impairments: Time                   PT - Cognition Comments: pt thinks it is Aug, and has trouble with sequencing mobilization Following commands: Intact      Cueing Cueing Techniques: Verbal cues     General Comments General comments (skin integrity, edema, etc.): BP in supine 87/65 seated 74/49      Pertinent Vitals/Pain Pain Assessment Pain Assessment: Faces Faces Pain Scale: Hurts a little bit Pain Location: generalized Pain Descriptors / Indicators: Discomfort, Aching Pain Intervention(s): Limited activity within patient's tolerance, Monitored during session, Repositioned  PT Goals (current goals can now be found in the care plan section) Acute Rehab PT Goals PT Goal Formulation: With patient/family Time For Goal Achievement: 09/29/23 Potential to Achieve Goals: Fair Progress towards PT goals: Not progressing toward goals - comment    Frequency    Min 2X/week       AM-PAC PT 6 Clicks  Mobility   Outcome Measure  Help needed turning from your back to your side while in a flat bed without using bedrails?: A Little Help needed moving from lying on your back to sitting on the side of a flat bed without using bedrails?: A Lot Help needed moving to and from a bed to a chair (including a wheelchair)?: A Lot Help needed standing up from a chair using your arms (e.g., wheelchair or bedside chair)?: A Lot Help needed to walk in hospital room?: Total Help needed climbing 3-5 steps with a railing? : Total 6 Click Score: 11    End of Session Equipment Utilized During Treatment: Gait belt Activity Tolerance: Patient tolerated treatment well Patient left: in bed;with call bell/phone within reach;with family/visitor present Nurse Communication: Mobility status PT Visit Diagnosis: Muscle weakness (generalized) (M62.81);Other abnormalities of gait and mobility (R26.89);Unsteadiness on feet (R26.81);Pain     Time: 9059-8985 PT Time Calculation (min) (ACUTE ONLY): 34 min  Charges:    $Therapeutic Activity: 23-37 mins PT General Charges $$ ACUTE PT VISIT: 1 Visit                     Chanika Byland B. Fleeta Lapidus PT, DPT Acute Rehabilitation Services Please use secure chat or  Call Office [phone]    Almarie KATHEE Fleeta [facility] 09/19/2023, 1:34 PM

## 2023-09-19 NOTE — Plan of Care (Signed)
 Palliative:  Opened chart to verify if palliative order placed as RN reports consult was placed yesterday. No order received or noted in active orders or order history. Order for palliative consultation will need to be placed by provider before we can engage with patient and family. Thank you.   Bernarda Kitty, NP Palliative Medicine Team Pager [phone] (Please see [email or web address] for schedule) Team Phone [phone]

## 2023-09-19 NOTE — Progress Notes (Addendum)
 STROKE TEAM PROGRESS NOTE   SUBJECTIVE (INTERVAL HISTORY) Her granddaughter is at the bedside.  Overall her condition is stable. She is at her baseline. R groin sheath has removed and pt felt better today. However, her BP still low at 70-80s.    OBJECTIVE Temp:  [97.5 F (36.4 C)-98.7 F (37.1 C)] 98 F (36.7 C) (06/26 0809) Pulse Rate:  [0-80] 67 (06/26 0414) Cardiac Rhythm: Normal sinus rhythm (06/25 2032) Resp:  [11-19] 16 (06/26 0414) BP: (87-145)/(56-107) 90/56 (06/26 0809) SpO2:  [92 %-100 %] 100 % (06/26 0906) Weight:  [79.1 kg] 79.1 kg (06/26 0438)  Recent Labs  Lab 09/18/23 1209 09/18/23 1340 09/18/23 1620 09/18/23 2104 09/19/23 0806  GLUCAP 136* 107* 123* 178* 89   Recent Labs  Lab 09/15/23 0830 09/16/23 0500 09/17/23 0457 09/18/23 0416 09/19/23 0428  NA 137 139 134* 134* 134*  K 4.3 4.2 4.2 4.2 4.3  CL 105 103 104 106 105  CO2 23 22 24  17* 24  GLUCOSE 116* 82 90 71 103*  BUN 33* 31* 24* 20 20  CREATININE 1.26* 1.40* 1.29* 1.13* 1.13*  CALCIUM  8.6* 8.6* 8.1* 7.9* 8.2*  MG 2.2 2.2 2.2 2.2 2.3   No results for input(s): AST, ALT, ALKPHOS, BILITOT, PROT, ALBUMIN in the last 168 hours. Recent Labs  Lab 09/15/23 0441 09/16/23 0500 09/17/23 0457 09/18/23 0416 09/19/23 0428  WBC 12.9* 15.3* 12.9* 13.8* 9.3  HGB 9.5* 9.3* 9.7* 9.6* 9.1*  HCT 31.6* 30.5* 31.5* 31.6* 30.7*  MCV 91.1 91.9 90.3 93.8 93.0  PLT 344 405* 404* 380 351   No results for input(s): CKTOTAL, CKMB, CKMBINDEX, TROPONINI in the last 168 hours. No results for input(s): LABPROT, INR in the last 72 hours. No results for input(s): COLORURINE, LABSPEC, PHURINE, GLUCOSEU, HGBUR, BILIRUBINUR, KETONESUR, PROTEINUR, UROBILINOGEN, NITRITE, LEUKOCYTESUR in the last 72 hours.  Invalid input(s): APPERANCEUR     Component Value Date/Time   CHOL 189 06/03/2023 0527   TRIG 178 (H) 06/03/2023 0527   HDL 39 (L) 06/03/2023 0527   CHOLHDL 4.8  06/03/2023 0527   VLDL 36 06/03/2023 0527   LDLCALC 114 (H) 06/03/2023 0527   Lab Results  Component Value Date   HGBA1C 7.5 (H) 06/02/2023      Component Value Date/Time   LABOPIA NONE DETECTED 08/19/2021 2259   COCAINSCRNUR NONE DETECTED 08/19/2021 2259   LABBENZ NONE DETECTED 08/19/2021 2259   AMPHETMU NONE DETECTED 08/19/2021 2259   THCU NONE DETECTED 08/19/2021 2259   LABBARB NONE DETECTED 08/19/2021 2259    No results for input(s): ETH in the last 168 hours.  I have personally reviewed the radiological images below and agree with the radiology interpretations.  CARDIAC CATHETERIZATION Result Date: 09/18/2023 Aborted procedure, see above for details. Sheath sutured in place. Newman JINNY Lawrence, MD  CT HEAD CODE STROKE WO CONTRAST Result Date: 09/18/2023 CLINICAL DATA:  Code stroke. Neuro deficit, concern for stroke, uneven pupils. EXAM: CT HEAD WITHOUT CONTRAST TECHNIQUE: Contiguous axial images were obtained from the base of the skull through the vertex without intravenous contrast. RADIATION DOSE REDUCTION: This exam was performed according to the departmental dose-optimization program which includes automated exposure control, adjustment of the mA and/or kV according to patient size and/or use of iterative reconstruction technique. COMPARISON:  MRI head 06/02/2023.  CT head 09/12/2023. FINDINGS: Brain: No acute intracranial hemorrhage. No CT evidence of acute infarct. No edema, mass effect, or midline shift. The basilar cisterns are patent. Nonspecific hypoattenuation in the periventricular and subcortical white  matter favored to reflect chronic microvascular ischemic changes. 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 Ventricles: The ventricles are normal. Vascular: Atherosclerotic calcifications of the carotid siphons and intracranial vertebral arteries. No hyperdense vessel. Skull: No acute or aggressive finding. Orbits: Orbits are symmetric. Sinuses: Mucosal thickening  in the bilateral sphenoid sinuses. Other: Mastoid air cells are clear. ASPECTS [facility] Stroke Program Early CT Score) - Ganglionic level infarction (caudate, lentiform nuclei, internal capsule, insula, M1-M3 cortex): 7 - Supraganglionic infarction (M4-M6 cortex): 3 Total score (0-10 with 10 being normal): 10 IMPRESSION: 1. No CT evidence of acute intracranial abnormality. 2. Chronic microvascular ischemic changes. 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 3. Mucosal thickening and possible air-fluid levels in the sphenoid sinuses. Recommend correlation for acute sinusitis. 4. ASPECTS is 10 These results were communicated to Dr. Jerri at 12:50 pm on 09/18/2023 by text page via the [facility] messaging system. Electronically Signed   By: Donnice Mania M.D.   On: 09/18/2023 12:50   VAS US  LOWER EXTREMITY SAPHENOUS VEIN MAPPING Result Date: 09/16/2023 LOWER EXTREMITY VEIN MAPPING Patient Name:  Anna [name]  Date of Exam:   09/16/2023 Medical Rec #: [id number]         Accession #:    [id number] Date of Birth: [birth year]        Patient Gender: F Patient Age:   68 years Exam Location:  [facility] Procedure:      VAS US  LOWER EXTREMITY SAPHENOUS VEIN MAPPING Referring Phys: KYLA DONALD --------------------------------------------------------------------------------  Indications:  Pre-op Risk Factors: Hypertension, hyperlipidemia, Diabetes.  Performing Technologist: Elmarie Lindau, RVT  Examination Guidelines: A complete evaluation includes B-mode imaging, spectral Doppler, color Doppler, and power Doppler as needed of all accessible portions of each vessel. Bilateral testing is considered an integral part of a complete examination. Limited examinations for reoccurring indications may be performed as noted. +----------------+-----------+--------------+----------------+-----------+ RT Diameter (cm)RT Findings     GSV      LT Diameter (cm)LT Findings  +----------------+-----------+--------------+----------------+-----------+       0.58                 Proximal thigh      0.43                  +----------------+-----------+--------------+----------------+-----------+       0.17       branching   Mid thigh                               +----------------+-----------+--------------+----------------+-----------+       0.15                  Distal thigh                             +----------------+-----------+--------------+----------------+-----------+       0.14                      Knee                                 +----------------+-----------+--------------+----------------+-----------+       0.16                   Prox calf                               +----------------+-----------+--------------+----------------+-----------+  Right Tech Comments: Patent right GSV with normal compression and no evidence of thrombus. The GSV is not seen distal to the prox calf area. Left Tech Comments: Lt BKA Summary:  Right: Patent right great saphenous vein with no evidence of        thrombus.        The right great saphenous vein measures 0.14 to 0.58 cm.  *See table(s) above for measurements and observations. Diagnosing physician: Lonni Gaskins MD Electronically signed by Lonni Gaskins MD on 09/16/2023 at 2:47:09 PM.    Final    VAS US  CAROTID Result Date: 09/16/2023 Carotid Arterial Duplex Study Patient Name:  Anna [name]  Date of Exam:   09/16/2023 Medical Rec #: [id number]         Accession #:    [id number] Date of Birth: 20-Jun-1955        Patient Gender: F Patient Age:   53 years Exam Location:  [facility] Procedure:      VAS US  CAROTID Referring Phys: KYLA DONALD --------------------------------------------------------------------------------  Indications:   Bilateral bruits. Risk Factors:  Hypertension, hyperlipidemia, Diabetes, prior CVA. Other Factors: Hx of bilat 1-39% ICA stenosis. Performing  Technologist: Elmarie Lindau, RVT  Examination Guidelines: A complete evaluation includes B-mode imaging, spectral Doppler, color Doppler, and power Doppler as needed of all accessible portions of each vessel. Bilateral testing is considered an integral part of a complete examination. Limited examinations for reoccurring indications may be performed as noted.  Right Carotid Findings: +----------+--------+--------+--------+------------------+------------------+           PSV cm/sEDV cm/sStenosisPlaque DescriptionComments           +----------+--------+--------+--------+------------------+------------------+ CCA Prox  59      12                                                   +----------+--------+--------+--------+------------------+------------------+ CCA Distal41      10              heterogenous                         +----------+--------+--------+--------+------------------+------------------+ ICA Prox  82      27                                intimal thickening +----------+--------+--------+--------+------------------+------------------+ ICA Distal77      22                                                   +----------+--------+--------+--------+------------------+------------------+ ECA       50                                                           +----------+--------+--------+--------+------------------+------------------+ +----------+--------+-------+----------------+-------------------+           PSV cm/sEDV cmsDescribe        Arm Pressure (mmHG) +----------+--------+-------+----------------+-------------------+ [id number]            Multiphasic, WNL                    +----------+--------+-------+----------------+-------------------+ +---------+--------+--+--------+--+---------+  VertebralPSV cm/s38EDV cm/s12Antegrade +---------+--------+--+--------+--+---------+  Left Carotid Findings:  +----------+--------+--------+--------+------------------+--------+           PSV cm/sEDV cm/sStenosisPlaque DescriptionComments +----------+--------+--------+--------+------------------+--------+ CCA Prox  72      20                                         +----------+--------+--------+--------+------------------+--------+ CCA Distal72      19              heterogenous               +----------+--------+--------+--------+------------------+--------+ ICA Prox  66      17              heterogenous               +----------+--------+--------+--------+------------------+--------+ ICA Distal103     31                                         +----------+--------+--------+--------+------------------+--------+ ECA       124                                                +----------+--------+--------+--------+------------------+--------+ +----------+--------+--------+----------------+-------------------+           PSV cm/sEDV cm/sDescribe        Arm Pressure (mmHG) +----------+--------+--------+----------------+-------------------+ [id number]              Multiphasic, WNL                    +----------+--------+--------+----------------+-------------------+ +---------+--------+--+--------+-+---------+ VertebralPSV cm/s34EDV cm/s8Antegrade +---------+--------+--+--------+-+---------+   Summary: Right Carotid: Velocities in the right ICA are consistent with a 1-39% stenosis. Left Carotid: Velocities in the left ICA are consistent with a 1-39% stenosis. Vertebrals:  Bilateral vertebral arteries demonstrate antegrade flow. Subclavians: Normal flow hemodynamics were seen in bilateral subclavian              arteries. *See table(s) above for measurements and observations.  Electronically signed by Lonni Gaskins MD on 09/16/2023 at 2:46:52 PM.    Final    CARDIAC CATHETERIZATION Result Date: 09/13/2023 Images from the original result were not included. Coronary  angiography 09/13/2023: LM: No significant disease LAD: Prox diffuse 70% disease with severe calcification (Severity could be underestimated given the diffuse nature of the disease)          Mid 90% stenosis          Diag 2 with prox diffuse 80% disease Lcx: No significant disease RCA: Prox 100% occlusion with severe prox-mid calcification          Left-to-tight collaterals from LAD to RPDA          RPDA diffuse disease at least 60% LVEDP 12 mmHg Severe multivessel disease in a diabetic patient. Presentation more likely chronic coronary syndrome than true ACS. Anatomically, she has good targets for surgical revascularization in at least LAD and diag, RPDA target is questionable. Recommend surgical consult for at least a discussion regarding CABG.  If she is not deemed to be a surgical candidate at this time, could consider medical management for CAD and GDMT for HFrEF, and consider CABG in the near future.  However, if she  is deemed not a surgical candidate due to all her other comorbidities, including left BKA, COPD, we could consider PCI options.  While her focal stenosis is in mid LAD, her diffuse disease in proximal and mid LAD is likely physiologically significant.  She would require atherectomy for optimal percutaneous revascularization to this LAD, also bearing in mind the possibility of requiring 2 stenting strategy given severe diffuse disease in a large diagonal 2.  Overall, percutaneous revascularization options are complex and high risk.  Other option could be medical management alone given her presentation of rather chronic coronary syndrome rather than ACS. Newman JINNY Lawrence, MD   CT HEAD WO CONTRAST ( ) Result Date: 09/12/2023 EXAM: CT HEAD WITHOUT CONTRAST 09/12/2023 10:32:00 PM TECHNIQUE: CT of the head was performed without the administration of intravenous contrast. Automated exposure control, iterative reconstruction, and/or weight based adjustment of the mA/kV was utilized to reduce the  radiation dose to as low as reasonably achievable. COMPARISON: MRI brain dated 06/02/2023. CLINICAL HISTORY: Head trauma, minor, normal mental status (Age 28-64y); hospital fall. FINDINGS: BRAIN AND VENTRICLES: There is no acute intracranial hemorrhage, mass effect or midline shift. No abnormal extra-axial fluid collection. The gray-white differentiation is maintained without an acute infarct. There is no hydrocephalus. Global cortical atrophy. Subcortical and periventricular small vessel ischemic changes. Intracranial atherosclerosis. ORBITS: The visualized portion of the orbits demonstrate no acute abnormality. SINUSES: The visualized paranasal sinuses and mastoid air cells demonstrate no acute abnormality. SOFT TISSUES AND SKULL: No acute abnormality of the visualized skull or soft tissues. IMPRESSION: 1. No acute intracranial abnormality. 2. Atrophy with small vessel ischemic changes. Electronically signed by: Pinkie Pebbles MD 09/12/2023 10:35 PM EDT RP Workstation: [id number]   ECHOCARDIOGRAM COMPLETE Result Date: 09/11/2023    ECHOCARDIOGRAM REPORT   Patient Name:   Anna [name] Date of Exam: 09/11/2023 Medical Rec #:  [id number]        Height:       62.0 in Accession #:    [id number]       Weight:       160.0 lb Date of Birth:  19-Jun-1955       BSA:          1.739 m Patient Age:    67 years         BP:           132/79 mmHg Patient Gender: F                HR:           99 bpm. Exam Location:  Inpatient Procedure: 2D Echo, Intracardiac Opacification Agent, Cardiac Doppler and Color            Doppler (Both Spectral and Color Flow Doppler were utilized during            procedure). Indications:    dyspnea  History:        Patient has prior history of Echocardiogram examinations, most                 recent 06/03/2023.  Sonographer:    Therisa Crouch Referring Phys: [phone] [facility]  Sonographer Comments: Technically difficult study due to poor echo windows. IMPRESSIONS  1. No apical thrombus with  Definity  contrast. Left ventricular ejection fraction, by estimation, is 40 to 45%. The left ventricle has mildly decreased function. The left ventricle demonstrates regional wall motion abnormalities (see scoring diagram/findings for description). Left ventricular diastolic parameters are consistent with Grade I diastolic dysfunction (  impaired relaxation). There is severe hypokinesis of the left ventricular, entire inferior wall, anterior segment, inferolateral wall and lateral wall. The apical lateral wall is hyperdynamic.  2. Right ventricular systolic function is moderately reduced. The right ventricular size is normal. There is normal pulmonary artery systolic pressure. The estimated right ventricular systolic pressure is 30.2 mmHg.  3. The mitral valve is grossly normal. Trivial mitral valve regurgitation.  4. The aortic valve was not well visualized. Aortic valve regurgitation is not visualized. No aortic stenosis is present.  5. The inferior vena cava is dilated in size with <50% respiratory variability, suggesting right atrial pressure of 15 mmHg. Comparison(s): Changes from prior study are noted. 06/03/2023: LVEF 60-65%. Conclusion(s)/Recommendation(s): Findings suggest probable LCX territory ischemia/infarct. FINDINGS  Left Ventricle: No apical thrombus with Definity  contrast. Left ventricular ejection fraction, by estimation, is 40 to 45%. The left ventricle has mildly decreased function. The left ventricle demonstrates regional wall motion abnormalities. Severe hypokinesis of the left ventricular, entire inferior wall, anterior segment, inferolateral wall and lateral wall. The left ventricular internal cavity size was normal in size. There is no left ventricular hypertrophy. Left ventricular diastolic parameters are consistent with Grade I diastolic dysfunction (impaired relaxation). Indeterminate filling pressures.  LV Wall Scoring: The entire lateral wall is hypokinetic. The apical lateral wall is  hyperdynamic. Right Ventricle: The right ventricular size is normal. No increase in right ventricular wall thickness. Right ventricular systolic function is moderately reduced. There is normal pulmonary artery systolic pressure. The tricuspid regurgitant velocity is 1.95 m/s, and with an assumed right atrial pressure of 15 mmHg, the estimated right ventricular systolic pressure is 30.2 mmHg. Left Atrium: Left atrial size was normal in size. Right Atrium: Right atrial size was normal in size. Pericardium: There is no evidence of pericardial effusion. Mitral Valve: The mitral valve is grossly normal. Trivial mitral valve regurgitation. Tricuspid Valve: The tricuspid valve is grossly normal. Tricuspid valve regurgitation is mild. Aortic Valve: The aortic valve was not well visualized. Aortic valve regurgitation is not visualized. No aortic stenosis is present. Pulmonic Valve: The pulmonic valve was normal in structure. Pulmonic valve regurgitation is not visualized. Aorta: The aortic root and ascending aorta are structurally normal, with no evidence of dilitation. Venous: The inferior vena cava is dilated in size with less than 50% respiratory variability, suggesting right atrial pressure of 15 mmHg. IAS/Shunts: No atrial level shunt detected by color flow Doppler.   LV Volumes (MOD) LV vol d, MOD A2C: 88.0 ml Diastology LV vol d, MOD A4C: 93.5 ml LV e' medial:   7.46 cm/s LV vol s, MOD A2C: 48.2 ml LV E/e' medial: 12.6 LV vol s, MOD A4C: 30.2 ml LV SV MOD A2C:     39.8 ml LV SV MOD A4C:     93.5 ml LV SV MOD BP:      52.0 ml RIGHT VENTRICLE            IVC RV S prime:     7.00 cm/s  IVC diam: 2.10 cm TAPSE (M-mode): 1.2 cm LEFT ATRIUM             Index LA Vol (A2C):   59.2 ml 34.05 ml/m LA Vol (A4C):   46.8 ml 26.92 ml/m LA Biplane Vol: 53.2 ml 30.60 ml/m  MITRAL VALVE                TRICUSPID VALVE MV Area (PHT): 6.07 cm     TR Peak grad:   15.2 mmHg MV  Decel Time: 125 msec     TR Vmax:        195.00 cm/s MV E  velocity: 93.80 cm/s MV A velocity: 101.00 cm/s MV E/A ratio:  0.93 Vinie Maxcy MD Electronically signed by Vinie Maxcy MD Signature Date/Time: 09/11/2023/4:47:49 PM    Final    CT Angio Chest Pulmonary Embolism (PE) W or WO Contrast Result Date: 09/11/2023 CLINICAL DATA:  Cough, dyspnea, recent COVID EXAM: CT ANGIOGRAPHY CHEST WITH CONTRAST TECHNIQUE: Multidetector CT imaging of the chest was performed using the standard protocol during bolus administration of intravenous contrast. Multiplanar CT image reconstructions and MIPs were obtained to evaluate the vascular anatomy. RADIATION DOSE REDUCTION: This exam was performed according to the departmental dose-optimization program which includes automated exposure control, adjustment of the mA and/or kV according to patient size and/or use of iterative reconstruction technique. CONTRAST:  60mL OMNIPAQUE  IOHEXOL  350 MG/ML SOLN COMPARISON:  09/09/2023, 09/30/2022 FINDINGS: Cardiovascular: This is a technically adequate evaluation of the pulmonary vasculature. No filling defects or pulmonary emboli. The heart is unremarkable without pericardial effusion. Normal caliber of the thoracic aorta. Atherosclerosis of the aorta and coronary vasculature. Mediastinum/Nodes: Fluid-filled distal thoracic esophagus may be due to reflux. Thyroid and trachea are unremarkable. Borderline enlarged mediastinal lymph nodes are identified, measuring up to 11 mm in the right paratracheal region and 15 mm in the subcarinal region, likely reactive. Lungs/Pleura: Small bilateral pleural effusions, estimated less than 500 cc each. Minimal scattered linear and ground-glass opacities, greatest in the mid and lower lung zones, compatible with given history of recent COVID infection. No dense consolidation or pneumothorax. 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 Upper Abdomen: There is trace free fluid in the  right upper quadrant with mild periportal edema, new since prior abdominal CT 08/08/2023. This is nonspecific. Musculoskeletal: No acute or destructive bony abnormalities. Reconstructed images demonstrate no additional findings. Review of the MIP images confirms the above findings. IMPRESSION: 1. No evidence of pulmonary embolus. 2. Scattered basilar predominant interstitial and ground-glass opacities compatible with given history of recent COVID pneumonia. 3. Small bilateral pleural effusions. 4. Borderline enlarged mediastinal adenopathy, likely reactive. 5. Nonspecific mild periportal edema and trace free fluid right upper quadrant, new since prior abdominal CT. 6. Aortic Atherosclerosis (ICD10-I70.0). Coronary artery atherosclerosis. 7. Findings suggesting underlying tracheobronchomalacia. Electronically Signed   By: Ozell Daring M.D.   On: 09/11/2023 15:01   VAS US  LOWER EXTREMITY VENOUS (DVT) (7a-7p) Result Date: 09/10/2023  Lower Venous DVT Study Patient Name:  Anna [name]  Date of Exam:   09/10/2023 Medical Rec #: [id number]         Accession #:    [id number] Date of Birth: 13-Nov-1955        Patient Gender: F Patient Age:   47 years Exam Location:  [facility] Procedure:      VAS US  LOWER EXTREMITY VENOUS (DVT) Referring Phys: TIMOTHY OPYD --------------------------------------------------------------------------------  Indications: Pain, Swelling, and Right foot. Other Indications: Diabetes, left BKA. Comparison Study: No priors. Performing Technologist: Ricka Sturdivant-Jones RDMS, RVT  Examination Guidelines: A complete evaluation includes B-mode imaging, spectral Doppler, color Doppler, and power Doppler as needed of all accessible portions of each vessel. Bilateral testing is considered an integral part of a complete examination. Limited examinations for reoccurring indications may be performed as noted. The reflux portion of the exam is performed with the patient in reverse  Trendelenburg.  +---------+---------------+---------+-----------+----------+--------------+ RIGHT    CompressibilityPhasicitySpontaneityPropertiesThrombus Aging +---------+---------------+---------+-----------+----------+--------------+ CFV  Full           Yes      Yes                                 +---------+---------------+---------+-----------+----------+--------------+ SFJ      Full                                                        +---------+---------------+---------+-----------+----------+--------------+ FV Prox  Full                                                        +---------+---------------+---------+-----------+----------+--------------+ FV Mid   Full                                                        +---------+---------------+---------+-----------+----------+--------------+ FV DistalFull                                                        +---------+---------------+---------+-----------+----------+--------------+ PFV      Full                                                        +---------+---------------+---------+-----------+----------+--------------+ POP      Full           Yes      Yes                                 +---------+---------------+---------+-----------+----------+--------------+ PTV      Full                                                        +---------+---------------+---------+-----------+----------+--------------+ PERO     Full                                                        +---------+---------------+---------+-----------+----------+--------------+   +----+---------------+---------+-----------+----------+--------------+ LEFTCompressibilityPhasicitySpontaneityPropertiesThrombus Aging +----+---------------+---------+-----------+----------+--------------+ CFV Full           Yes      Yes                                  +----+---------------+---------+-----------+----------+--------------+ SFJ Full                                                        +----+---------------+---------+-----------+----------+--------------+  Summary: RIGHT: - There is no evidence of deep vein thrombosis in the lower extremity.  - No cystic structure found in the popliteal fossa.  LEFT: - No evidence of common femoral vein obstruction.   *See table(s) above for measurements and observations. Electronically signed by Debby Robertson on 09/10/2023 at 4:30:57 PM.    Final    DG Chest 2 View Result Date: 09/09/2023 EXAM: 2 VIEW(S) XRAY OF THE CHEST 09/09/2023 07:55:00 PM COMPARISON: 08/08/2023 CLINICAL HISTORY: Cough. SOB, cough for over 2 weeks. History of COPD. FINDINGS: LUNGS AND PLEURA: No focal pulmonary opacity. No pulmonary edema. No pleural effusion. No pneumothorax. HEART AND MEDIASTINUM: No acute abnormality of the cardiac and mediastinal silhouettes. BONES AND SOFT TISSUES: Mild degenerative changes of the mid/lower thoracic spine. Cholecystectomy clips. No acute osseous abnormality. IMPRESSION: 1. No acute process. Electronically signed by: Pinkie Pebbles MD 09/09/2023 08:02 PM EDT RP Workstation: [id number]     PHYSICAL EXAM  Temp:  [97.5 F (36.4 C)-98.7 F (37.1 C)] 98 F (36.7 C) (06/26 0809) Pulse Rate:  [0-80] 67 (06/26 0414) Resp:  [11-19] 16 (06/26 0414) BP: (87-145)/(56-107) 90/56 (06/26 0809) SpO2:  [92 %-100 %] 100 % (06/26 0906) Weight:  [79.1 kg] 79.1 kg (06/26 0438)  General - Well nourished, well developed, in no apparent distress.  Ophthalmologic - fundi not visualized due to noncooperation.  Cardiovascular - Regular rhythm and rate.  Mental Status -  Level of arousal and orientation to time, place, and person were intact. Language including expression, naming, repetition, comprehension was assessed and found intact. Fund of Knowledge was assessed and was intact.   Cranial Nerves II - XII  - II - Vision intact OU.  Left visual acuity decreased than right III, IV, VI - Extraocular movements intact.  Left pupil 3 mm, right pupil 2 mm in dark, both pupils 2mm under light, indicating R pupil sympathetic deficit V - Facial sensation intact bilaterally. VII - Facial movement intact bilaterally. VIII - Hearing & vestibular intact bilaterally. X - Palate elevates symmetrically. XI - Chin turning & shoulder shrug intact bilaterally. XII - Tongue protrusion intact.   Motor Strength - The patient's strength was normal in all extremities and pronator drift was absent except left BKA.    Motor Tone & Bulk - Muscle tone was assessed at the neck and appendages and was normal.  Bulk was normal and fasciculations were absent.    Reflexes - The patient's reflexes were normal in all extremities and she had no pathological reflexes.   Sensory - Light touch, temperature/pinprick were assessed and were normal.     Coordination - The patient had normal movements in the hands with no ataxia or dysmetria.  Tremor was absent.   Gait and Station - deferred   ASSESSMENT/PLAN Ms. Anna [name] is a 68 y.o. female with history of  PAD s/p L BKA, HTN, HLD, DM, DVT, remote GIB in 1984, COPD, right thalamic infarct in 05/2023 admitted for COPD exacerbation and NSTEMI.  During repeat cardiac catheterization with atherectomy of LAD system 6/25 she received two doses of fentanyl , developed mental status change and profound hypotension. Medication reversed with narcan  and flumazenil, pt BP improved and mental status recovered. However, pt was found to have left pupil bigger than right. Code stroke was called. 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 No focal deficit on neuro exam. Pt procedure aborted and stat CT no acute finding. Cardiology on board, now on ASA 81 and  lipitor  80 with palliative care for GOC discussion.   On today's exam, anisocoria seen in dark with right pupil  failed to dilate, concerning for right pupil sympathetic deficit. With isolated deficit, likely chronic and more consistent with local pathology, recommend outpt follow up with ophthalmology. No further neuro work up needed at this time. Hypotension treatment per cardiology and primary team. BP goal normotensive from neuro standpoint  Of note, patient was admitted in 05/2023 after fall and hit her head. She also complained she had left face and arm numbness-2 weeks prior to admission. MRI showed right thalamic lacunar infarct, likely small vessel disease. 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 EF 60 to 65%, LDL 114, A1c 7.5. Discharged on aspirin  81 and Brilinta  for 1 months and then back to aspirin  and Plavix . Also increased Lipitor  from 40-80. Pt has an appointment with Dr Rosemarie on 12/23/23.   Hospital day # 9  Neurology will sign off. Please call with questions. Pt will follow up with stroke clinic Dr Rosemarie at [facility] on 12/23/23. Thanks for the consult.  Ary Cummins, MD PhD Stroke Neurology 09/19/2023 12:05 PM    To contact Stroke Continuity provider, please refer to [email or web address]. After hours, contact General Neurology

## 2023-09-19 NOTE — Progress Notes (Addendum)
 Progress Note  Patient Name: Anna [name] Date of Encounter: 09/19/2023 [facility] Cardiologist: Madonna Large, DO   Interval Summary   Patient with granddaughter in room She states that she feels better than she did yesterday They are planning on talking to palliative care today to solidify goals of care Patient understands her current condition and is open to hearing options   Vital Signs Vitals:   09/19/23 0014 09/19/23 0414 09/19/23 0438 09/19/23 0809  BP: (!) 93/59 (!) 113/96  (!) 90/56  Pulse: 64 67    Resp:  16    Temp:  (!) 97.5 F (36.4 C)  98 F (36.7 C)  TempSrc:  Oral  Oral  SpO2:  100%  100%  Weight:   79.1 kg   Height:        Intake/Output Summary (Last 24 hours) at 09/19/2023 0852 Last data filed at 09/19/2023 0004 Gross per 24 hour  Intake 644 ml  Output 1000 ml  Net -356 ml      09/19/2023    4:38 AM 09/18/2023    5:00 AM 09/17/2023    4:48 PM  Last 3 Weights  Weight (lbs) 174 lb 6.1 oz 164 lb 7.4 oz 169 lb 15.6 oz  Weight (kg) 79.1 kg 74.6 kg 77.1 kg      Telemetry/ECG  Sinus rhythm, HR 60s, PVCs - Personally Reviewed  Physical Exam  GEN: No acute distress.   Neck: No JVD Cardiac: RRR, no murmurs, rubs, or gallops.  Respiratory: Clear to auscultation bilaterally. GI: Soft, nontender, non-distended  MS: No edema, left BKA  Assessment & Plan  68 y.o. female with a history of PAD s/p left below knee amputation, hypertension, hyperlipidemia, type 2 diabetes mellitus, CVA in 05/2023, prior DVT, remote GI bleed in 1984 who was admitted on 09/09/2023 for a COPD exacerbation after presenting with cough and shortness of breath. Echo showed mildly reduced EF with wall motion abnormalities in LCX territory. Troponin was then checked and came back elevated. Therefore, Cardiology was consulted. She underwent LHC on 09/13/2023 which showed severe multivessel CAD. CT surgery was consulted, determined she was not CABG candidate therefore she was  scheduled for repeat cardiac catheterization with atherectomy of LAD system however procedure was aborted due to complications   NSTEMI Severe multivessel CAD Hyperlipidemia Presented with intermittent chest pain at rest/with exertion Troponin 385  Echo showed EF 40-45%, hypokinesis of entire inferior wall, anterior segment, inferolateral wall, and lateral wall  LHC showed severe multivessel disease CT surgery was consulted and determined that she is not a candidate for CABG Attempted atherectomy of LAD on 6/25, procedure was aborted due to complications in regards to medications administered, family was contacted Recommendations were to consider medical management with palliative care Continue ASA 81 mg daily Continue Toprol  25 mg daily Continue Lipitor  80 mg daily   Newly diagnosed HFmrEF Hypertension  Echo showed LVEF of 40-45% with wall motion abnormalities as described above and grade 1 diastolic dysfunction as well as moderately reduced RV function  BNP 1,360 > 964 LVEDP on cath was normal, 12 mmHg Underwent diuresis with IV Bumex   Net - 9 L this admission  Most recent BP 113/96, HR 60s Appears euvolemic on exam  Creatinine stable, 1.13 today  Currently on Losartan  25 mg daily, Toprol  25 mg daily, spironolactone  25 mg daily -- can hold/adjust based on follow up BP readings  Deferred SGL2Ti given history of UTIs    PAD S/p left BKA Continue ASA and  statin as above Followed by vascular surgery as outpatient   Goals of care Cardiac cath aborted yesterday due to patient being unable to tolerate sedation Patient agrees that she did not want to proceed with the cath after that event  Family to meet with palliative care team today  Spoke to RN who has been taking care of patient periodically over the past week and he states that when she was given oxycodone  sometime last week she required a rapid response called on her as well  Palliative care team to see today and discuss plans     Per primary COPD Cough CKD T2DM, neuropathy Anemia  GERD CVA Chronic back pain    For questions or updates, please contact [facility] Please consult [email or web address] for contact info under       Signed, Waddell DELENA Donath, PA-C   Personally seen and examined. Agree with above.  Lengthy conversation once again with family members, including Clayborne on the phone.  She will be here a little bit later this afternoon.  Palliative care team will be seeing her soon today.  Discussed with nursing team.  Has had some challenges with hypotension.  Cardiac catheterization was not able to be performed because during very mild sedation, she became obtunded and unstable.  She is not a candidate for surgery as well.  We will continue medical management for her non-STEMI Aspirin  81 mg Atorvastatin  80 mg Toprol -XL 25 mg DC Spironolactone  25 mg DC losartan  25 mg.  Blood pressure is soft.  Her main goals are for comfort, palliation.  Understands increased morbidity mortality over the next 6 months.  We will go ahead and sign off.  Please let us  know if we can be of further assistance.  Family very appreciative.  Oneil Parchment, MD

## 2023-09-19 NOTE — Progress Notes (Signed)
 CARDIAC REHAB PHASE I    Post MI education including restrictions, risk factors, exercise guidelines, MI booklet, heart healthy diabetic diet and CRP2 reviewed. All questions and concerns addressed. Will refer to [facility] for CRP2 per protocol. Pt has complex medical issues that may impede participation in CRP2. Palliative care to see today and plan for SNF at discharge. CRP1 will sign off today.   [id number] Vaughn Asberry Hacking, RN BSN 09/19/2023 10:39 AM

## 2023-09-19 NOTE — Consult Note (Signed)
 Consultation Note Date: 09/19/2023   Patient Name: Anna [name]  DOB: Dec 15, 1955  MRN: [id number]  Age / Sex: 68 y.o., female  PCP: Cloria Annabella CROME, DO Referring Physician: Judeth Trenda BIRCH, MD  Reason for Consultation:  goals of care  HPI/Patient Profile: 68 y.o. female  with past medical history of COPD, anxiety, HLD, CVA, PAD, status post L BKA, DM, admitted on 09/09/2023 with shortness of breath and chest pain that was presumed COPD exacerbation due to history of COVID infection 2 weeks prior.  Significant events during this hospitalization as follows:   6/19- Echocardiogram- EF 35-40%- suspected ischemia 6/20- NSTEMI- Cardiac cath- multiple vessels with stenosis, near occlusion- CT surgery consulted and recommended CABG with after stabilization of patient's respiratory status 6/21-pulmonology consulted for surgical clearance-noted patient with tracheolamacia-deemed her high risk but a possible candidate for CABG 6/24-cardiothoracic surgery reevaluated and determined patient was not a CABG candidate 6/25-attempted for repeat cath with atherectomy of LAD-however after minimal sedation patient had mental status change became hypotensive requiring Narcan  and the procedure had to be aborted-code stroke was called-CT of the head was negative for acute findings  Palliative medicine consulted as patient now has no further options for cardiac interventions.  Primary Decision Maker PATIENT - next of kin- son Larnell, Granddaughters- Clayborne, Geofm  Discussion: Extensive chart review completed including cardiology, pulmonology, and cardiothoracic surgery notes.  CT head reviewed.  Echocardiogram reviewed. Case discussed with attending and with bedside RN. Per notes patient has been somewhat anxious during this hospitalization.  PT note indicates she was emotional during consult today. [facility]  She has an in-home aide who comes for 2 hours a day 5 days a week.  She is wheelchair-bound. On my evaluation patient was sleeping.  She awoke to my voice, however remained very somnolent falling asleep while I attempted to have discussion with her.   She was unable to elicit why she was in the hospital or conditions with her heart.  She only repeated I just do not have the strength again and again.  She declined to engage in goals of care discussion with me at the bedside, returning to sleep. No family at bedside.  SUMMARY OF RECOMMENDATIONS - Continue current interventions - Palliative team member will reach out to family tomorrow and attempt to arrange family meeting  Code Status/Advance Care Planning:   Code Status: Full Code    Prognosis:   Unable to determine  Discharge Planning: To Be Determined  Primary Diagnoses: Present on Admission:  COPD with acute exacerbation (HCC)  OSA (obstructive sleep apnea)  CKD stage 3a, GFR 45-59 ml/min (HCC)  Essential hypertension  COPD exacerbation (HCC)   Review of Systems  Unable to perform ROS   Physical Exam Vitals and nursing note reviewed.  Constitutional:      General: She is not in acute distress.    Appearance: Normal appearance.   Cardiovascular:     Rate and Rhythm: Normal rate.  Pulmonary:     Effort: Pulmonary  effort is normal.   Neurological:     Comments: somnolent    Vital Signs: BP (!) 78/66 (BP Location: Right Arm)   Pulse 67   Temp 98 F (36.7 C) (Oral)   Resp 16   Ht 5' 2 (1.575 m)   Wt 79.1 kg   SpO2 100%   BMI 31.90 kg/m  Pain Scale: 0-10 POSS *See Group Information*: 1-Acceptable,Awake and alert Pain Score: 0-No pain   SpO2: SpO2: 100 % O2 Device:SpO2: 100 % O2 Flow Rate: .O2 Flow Rate (L/min): 2 L/min  IO: Intake/output summary:  Intake/Output Summary (Last 24 hours) at 09/19/2023 1557 Last data filed at 09/19/2023 0004 Gross per 24 hour  Intake 600 ml  Output 600 ml  Net 0  ml    LBM: Last BM Date : 09/16/23 Baseline Weight: Weight: 72.6 kg Most recent weight: Weight: 79.1 kg       Thank you for this consult. Palliative medicine will continue to follow and assist as needed.  Time Total:  70 minutes Signed by: Cassondra Stain, AGNP-C Palliative Medicine  Time includes:   Preparing to see the patient (e.g., review of tests) Obtaining and/or reviewing separately obtained history Performing a medically necessary appropriate examination and/or evaluation Counseling and educating the patient/family/caregiver Ordering medications, tests, or procedures Referring and communicating with other health care professionals (when not reported separately) Documenting clinical information in the electronic or other health record Independently interpreting results (not reported separately) and communicating results to the patient/family/caregiver Care coordination (not reported separately) Clinical documentation   Please contact Palliative Medicine Team phone at [phone] for questions and concerns.  For individual provider: See Tracey

## 2023-09-19 NOTE — Progress Notes (Signed)
 Occupational Therapy Treatment Patient Details Name: Anna [name] MRN: [id number] DOB: December 11, 1955 Today's Date: 09/19/2023   History of present illness Anna [name] is a 68 y.o. female admitted 09/09/23 for COPD exacerbation. She reports a COVID-19 infection about 2.5 weeks prior which was treated with Paxlovid . LHC on 6/20 demonstrated multivessel CAD. Plan for CABG once pulmonary status improves. PMHx: CAD, HFrEF, COPD, CVA, IDDM-2, PAD s/p left BKA, HTN, anxiety, depression, OSA on CPAP, asthma/chronic bronchitis, and obesity.   OT comments  Pt progressing towards goals. Pt labile regarding current situation and palliative consult. Pt c/o of SOB and back pain in recliner. Despite efforts to sit up and readjust, pt still SOB. Soft bps noted in general comment section with RN notified. Decreased UB strength noted from last session, requiring increased assistance for transfer.  Continue to recommend <3 hours of skilled rehab daily. Will continue to follow acutely.       If plan is discharge home, recommend the following:  A little help with walking and/or transfers;A little help with bathing/dressing/bathroom;Assistance with cooking/housework;Assist for transportation   Equipment Recommendations  Other (comment) Armed forces technical officer)    Recommendations for Other Services      Precautions / Restrictions Precautions Precautions: Fall Recall of Precautions/Restrictions: Intact Restrictions Weight Bearing Restrictions Per Provider Order: No       Mobility Bed Mobility Overal bed mobility: Needs Assistance Bed Mobility: Sit to Sidelying         Sit to sidelying: Contact guard assist, Used rails General bed mobility comments: No assist, but increased time    Transfers Overall transfer level: Needs assistance Equipment used: Rolling walker (2 wheels) Transfers: Sit to/from Stand, Bed to chair/wheelchair/BSC Sit to Stand: Max assist Stand pivot transfers: Max assist          General transfer comment: Assist to come to full stand with tactile cues to push hips into ext, Assist to swing hips into bed     Balance Overall balance assessment: Needs assistance Sitting-balance support: No upper extremity supported, Feet supported Sitting balance-Leahy Scale: Poor     Standing balance support: Reliant on assistive device for balance, Bilateral upper extremity supported, During functional activity Standing balance-Leahy Scale: Poor Standing balance comment: Reliant on RW and external support     ADL either performed or assessed with clinical judgement   ADL Overall ADL's : Needs assistance/impaired       Toilet Transfer: Maximal assistance;Stand-pivot;Rolling walker (2 wheels) Toilet Transfer Details (indicate cue type and reason): Simulated in room         Functional mobility during ADLs: Maximal assistance;Rolling walker (2 wheels)      Extremity/Trunk Assessment Upper Extremity Assessment Upper Extremity Assessment: Generalized weakness (BUE 3+/5)   Lower Extremity Assessment Lower Extremity Assessment: Defer to PT evaluation        Vision   Vision Assessment?: No apparent visual deficits         Communication Communication Communication: Impaired Factors Affecting Communication: Reduced clarity of speech   Cognition Arousal: Lethargic Behavior During Therapy: WFL for tasks assessed/performed, Lability Cognition: No apparent impairments     Following commands: Intact        Cueing   Cueing Techniques: Verbal cues        General Comments Pt c/o of SOB while seated in chair BP 91/54 pulse 119, once returned to supine BP 78/66 pulse 71. Placed bed in chair position    Pertinent Vitals/ Pain       Pain Assessment Pain Assessment:  Faces Faces Pain Scale: Hurts whole lot Pain Location: generalized Pain Descriptors / Indicators: Discomfort, Aching Pain Intervention(s): Limited activity within patient's  tolerance   Frequency  Min 2X/week        Progress Toward Goals  OT Goals(current goals can now be found in the care plan section)  Progress towards OT goals: OT to reassess next treatment  Acute Rehab OT Goals Patient Stated Goal: To rest OT Goal Formulation: With patient Time For Goal Achievement: 09/29/23 Potential to Achieve Goals: Fair ADL Goals Pt Will Perform Lower Body Dressing: sitting/lateral leans;with supervision;with set-up Pt Will Transfer to Toilet: with supervision;bedside commode;stand pivot transfer Pt Will Perform Tub/Shower Transfer: Tub transfer;tub bench;with supervision Pt/caregiver will Perform Home Exercise Program: Increased strength;With written HEP provided;Independently  Plan         AM-PAC OT 6 Clicks Daily Activity     Outcome Measure   Help from another person eating meals?: None Help from another person taking care of personal grooming?: A Little Help from another person toileting, which includes using toliet, bedpan, or urinal?: A Lot Help from another person bathing (including washing, rinsing, drying)?: A Lot Help from another person to put on and taking off regular upper body clothing?: A Little Help from another person to put on and taking off regular lower body clothing?: A Lot 6 Click Score: 16    End of Session Equipment Utilized During Treatment: Rolling walker (2 wheels)  OT Visit Diagnosis: Unsteadiness on feet (R26.81);Other abnormalities of gait and mobility (R26.89);Muscle weakness (generalized) (M62.81)   Activity Tolerance Patient limited by lethargy;Patient limited by pain;Patient limited by fatigue   Patient Left in bed;with call bell/phone within reach;with bed alarm set;with family/visitor present   Nurse Communication Mobility status        Time: 8643-8575 OT Time Calculation (min): 28 min  Charges: OT General Charges $OT Visit: 1 Visit OT Treatments $Self Care/Home Management : 23-37 mins  Adrianne BROCKS,  OT  Acute Rehabilitation Services Office [phone] Secure chat preferred   Adrianne GORMAN Savers 09/19/2023, 2:50 PM

## 2023-09-19 NOTE — TOC Progression Note (Signed)
 Transition of Care [facility]) - Progression Note    Patient Details  Name: Anna [name] MRN: [id number] Date of Birth: 12/13/1955  [facility] CM/SW Contact  Gwenn Julien [place], [place] Phone Number: 09/19/2023, 9:12 AM  Clinical Narrative: PASRR received [id number], expires 10/18/23. SW continuing to follow for dc to [place] pending medical clearance.   Julien Gwenn, MSW, LCSW [phone] (coverage)        Expected Discharge Plan: Skilled Nursing Facility Barriers to Discharge: Continued Medical Work up, SNF Pending bed offer  Expected Discharge Plan and Services In-house Referral: Clinical Social Work Discharge Planning Services: CM Consult Post Acute Care Choice: Skilled Nursing Facility Living arrangements for the past 2 months: Apartment                 DME Arranged: N/A DME Agency: NA       HH Arranged:  (see note)           Social Determinants of Health (SDOH) Interventions SDOH Screenings   Food Insecurity: No Food Insecurity (09/10/2023)  Housing: High Risk (09/10/2023)  Transportation Needs: No Transportation Needs (09/10/2023)  Utilities: At Risk (09/10/2023)  Social Connections: Socially Isolated (09/10/2023)  Tobacco Use: Low Risk  (09/10/2023)    Readmission Risk Interventions    09/11/2023   11:53 AM  Readmission Risk Prevention Plan  Transportation Screening Complete  Medication Review (RN Care Manager) Referral to Pharmacy  PCP or Specialist appointment within 3-5 days of discharge Complete  HRI or Home Care Consult Complete

## 2023-09-19 NOTE — Progress Notes (Signed)
   Heart Failure Stewardship Pharmacist Progress Note   PCP: Cloria Annabella CROME, DO PCP-Cardiologist: Madonna Large, DO   HPI:  54 YOF with PMH of HTN, DM, COPD, CVA, depression, anxiety, s/p BKA. Recent self-reported COVID infection 2 1/2 weeks ago.  Presented to ED with cough and dyspnea. Received molnupiravir, symptoms initially improved then worsened, then given doxy. CXR showed no acute finding.  ECHO showed LVEF 40-45% (previously 60-65% 05/2023), with mildly decreased function, grade I diastolic dysfunction, severe hypokinesis. Right ventricle systolic function is moderately reduced.  L heart cath showed LAD Prox diffuse 70% stenosis, Mid 90% stenosis, Diag 2 with prox diffuse 80% stenosis. RCA Prox 100% occlusion with severe prox-mid calcification, left-to-right collaterals from LAD to RPDA. If not surgical candidate, medical management alone may be sufficient.  Per CT surgery, not candidate for CABG. Atherectomy procedure aborted due to complication. Code stroke called, CT showed no acute finding, no deficit on neuro exam. Likely not a candidate for any future surgical intervention. Plan to medically manage.  On exam, patient endorses some chest discomfort. Some lower extremity swelling present. Initially denied SOB but as conversation progressed she said she was having more labored breathing. Denied dizziness but said she felt groggy.  Current HF Medications: Beta Blocker: metoprolol  succ 25 mg daily ACE/ARB/ARNI: losartan  25 mg QPM MRA: spironolactone  25 mg daily  Prior to admission HF Medications: ACE/ARB/ARNI: lisinopril  5 mg daily  Pertinent Lab Values: Serum creatinine 1.29>1.13>1.13, BUN 20, Potassium 4.3, Sodium 134, BNP 964, Mg 2.3,  A1c 7.5 (05/2023)  Vital Signs: Weight: 164 lbs today. Admission weight: 160 lbs - incomplete - bed weights.  Blood pressure: 90's/50-60's Heart rate: 65 I/O: net -0.87L yesterday; net -9L since admission  Medication Assistance / Insurance  Benefits Check: Does the patient have prescription insurance?  Yes Type of insurance plan: [employer]   Outpatient Pharmacy:  Prior to admission outpatient pharmacy: [facility] - [place], [place] - [address] Is the patient willing to use [facility] TOC pharmacy at discharge? Pending Is the patient willing to transition their outpatient pharmacy to utilize a [facility] outpatient pharmacy?   No   Assessment: 1. Acute on chronic systolic CHF (LVEF 40-45%), due to NICM/ICM. NYHA class III symptoms. - Continue spironolactone  25 mg daily.  - Continue metoprolol  succ 25 mg daily - Continue losartan  25 mg daily - Daily weight. Strict I/O's - Keep K > 4, Mg > 2.    Plan: 1) Medication changes recommended at this time: - Continue to monitor BP and assess for symptoms of hypotension - Avoid SGLTi - history of chronic UTI  2) Patient assistance: - Has insurance with [employer]   3)  Education  - Initial education provided. - To be completed prior to discharge  Bernardino George, PharmD Candidate 2026 [facility] [employer] 09/19/2023 2:31 PM

## 2023-09-20 DIAGNOSIS — Z7189 Other specified counseling: Secondary | ICD-10-CM

## 2023-09-20 DIAGNOSIS — Z515 Encounter for palliative care: Secondary | ICD-10-CM | POA: Diagnosis not present

## 2023-09-20 DIAGNOSIS — J441 Chronic obstructive pulmonary disease with (acute) exacerbation: Secondary | ICD-10-CM | POA: Diagnosis not present

## 2023-09-20 LAB — GLUCOSE, CAPILLARY
Glucose-Capillary: 93 mg/dL (ref 70–99)
Glucose-Capillary: 126 mg/dL — ABNORMAL HIGH (ref 70–99)
Glucose-Capillary: 105 mg/dL — ABNORMAL HIGH (ref 70–99)
Glucose-Capillary: 168 mg/dL — ABNORMAL HIGH (ref 70–99)

## 2023-09-20 LAB — LIPOPROTEIN A (LPA): Lipoprotein (a): 68.3 nmol/L — ABNORMAL HIGH (ref <75.0)

## 2023-09-20 LAB — HEPARIN LEVEL (UNFRACTIONATED): Heparin Unfractionated: <0.1 [IU]/mL — ABNORMAL LOW (ref 0.30–0.70)

## 2023-09-20 MED FILL — Clopidogrel Bisulfate Tab 300 MG (Base Equiv): ORAL | Qty: 2 | Status: AC

## 2023-09-20 MED FILL — Vasopressin IV Soln 20 Unit/ML (For IV Infusion): INTRAVENOUS | Qty: 20 | Status: AC

## 2023-09-20 NOTE — TOC Progression Note (Signed)
 Transition of Care [facility]) - Progression Note    Patient Details  Name: Anna [name] MRN: [id number] Date of Birth: Dec 30, 1955  [facility] CM/SW Contact  Gwenn Frieze [place], [place] Phone Number: 09/20/2023, 12:16 PM  Clinical Narrative: Palliative consult for GOC and family meeting pending. Spoke to [employer] with [facility] [phone] who reports if pt does not dc to [facility] today, they will not be able to facilitate the process until Monday. [name] with [facility] updated. SW will follow.   Frieze Gwenn, MSW, LCSW [phone] (coverage)      Expected Discharge Plan: Skilled Nursing Facility Barriers to Discharge: Continued Medical Work up, SNF Pending bed offer  Expected Discharge Plan and Services In-house Referral: Clinical Social Work Discharge Planning Services: CM Consult Post Acute Care Choice: Skilled Nursing Facility Living arrangements for the past 2 months: Apartment                 DME Arranged: N/A DME Agency: NA       HH Arranged:  (see note)           Social Determinants of Health (SDOH) Interventions SDOH Screenings   Food Insecurity: No Food Insecurity (09/10/2023)  Housing: High Risk (09/10/2023)  Transportation Needs: No Transportation Needs (09/10/2023)  Utilities: At Risk (09/10/2023)  Social Connections: Socially Isolated (09/10/2023)  Tobacco Use: Low Risk  (09/10/2023)    Readmission Risk Interventions    09/11/2023   11:53 AM  Readmission Risk Prevention Plan  Transportation Screening Complete  Medication Review (RN Care Manager) Referral to Pharmacy  PCP or Specialist appointment within 3-5 days of discharge Complete  HRI or Home Care Consult Complete

## 2023-09-20 NOTE — Progress Notes (Signed)
 Mobility Specialist Progress Note;   09/20/23 1118  Mobility  Activity Dangled on edge of bed  Level of Assistance Moderate assist, patient does 50-74%  Assistive Device None  Activity Response Tolerated fair  Mobility Referral Yes  Mobility visit 1 Mobility  Mobility Specialist Start Time (ACUTE ONLY) 1118  Mobility Specialist Stop Time (ACUTE ONLY) 1129  Mobility Specialist Time Calculation (min) (ACUTE ONLY) 11 min    Pre-mobility: BP 102/57 (72) Supine During-mobility: BP 94/55 (68) sitting EoB  Pt agreeable to mobility. Required ModA for bed mobility and to come sitting on EoB. Pt deferred Oob mobility d/t feeling drowsy. Bps taken above. Pt returned back to supine with all needs met, alarm on.   Lauraine Erm Mobility Specialist Please contact via SecureChat or [employer] [phone]

## 2023-09-20 NOTE — Progress Notes (Signed)
   Heart Failure Stewardship Pharmacist Progress Note   PCP: Cloria Annabella CROME, DO PCP-Cardiologist: Madonna Large, DO   HPI:  49 YOF with PMH of HTN, DM, COPD, CVA, depression, anxiety, s/p BKA. Recent self-reported COVID infection 2 1/2 weeks ago.  Presented to ED with cough and dyspnea. Received molnupiravir, symptoms initially improved then worsened, then given doxy. CXR showed no acute finding.  ECHO showed LVEF 40-45% (previously 60-65% 05/2023), with mildly decreased function, grade I diastolic dysfunction, severe hypokinesis. Right ventricle systolic function is moderately reduced.  L heart cath showed LAD Prox diffuse 70% stenosis, Mid 90% stenosis, Diag 2 with prox diffuse 80% stenosis. RCA Prox 100% occlusion with severe prox-mid calcification, left-to-right collaterals from LAD to RPDA. If not surgical candidate, medical management alone may be sufficient.  Per CT surgery, not candidate for CABG. Atherectomy procedure aborted due to complication. Code stroke called, CT showed no acute finding, no deficit on neuro exam. Likely not a candidate for any future surgical intervention. Plan to medically manage.  Palliative care consulted, planning to meet with family today to establish goals of care.  On exam, some chest discomfort and SOB but states she feels better. More alert today. Some lower extremity swelling present.   Current HF Medications: Beta Blocker: metoprolol  succ 25 mg daily  Prior to admission HF Medications: ACE/ARB/ARNI: lisinopril  5 mg daily  Pertinent Lab Values: (6/26) Serum creatinine 1.13>1.13, BUN 20, Potassium 4.3, Sodium 134, BNP 964, Mg 2.3,  A1c 7.5 (05/2023)  Vital Signs: Weight: 174 lbs today. Admission weight: 160 lbs - incomplete - bed weights.  Blood pressure: 70-90's/50's overnight - most recent 106/66 Heart rate: 69 I/O: net -0.6L yesterday; net -9.4L since admission  Medication Assistance / Insurance Benefits Check: Does the patient have  prescription insurance?  Yes Type of insurance plan: [employer]   Outpatient Pharmacy:  Prior to admission outpatient pharmacy: [facility] - [place], [place] - [address] Is the patient willing to use [facility] TOC pharmacy at discharge? Pending Is the patient willing to transition their outpatient pharmacy to utilize a [facility] outpatient pharmacy?   No   Assessment: 1. Acute on chronic systolic CHF (LVEF 40-45%), due to NICM/ICM. NYHA class III symptoms. - Continue metoprolol  succ 25 mg daily - Daily weight. Strict I/O's - Keep K > 4, Mg > 2.    Plan: 1) Medication changes recommended at this time: - Has some swelling in her foot - pending palliative GOC discussion, consider bumex  x 1  - Continue to monitor BP and assess for symptoms of hypotension - GDMT options limited - Avoid SGLTi - history of chronic UTI  2) Patient assistance: - Has insurance with [employer]   3)  Education  - Initial education provided. - To be completed prior to discharge  Bernardino George, PharmD Candidate 2026 [facility] [employer] 09/20/2023 1:44 PM

## 2023-09-20 NOTE — Progress Notes (Signed)
 Daily Progress Note   Patient Name: Anna [name]       Date: 09/20/2023 DOB: Oct 18, 1955  Age: 68 y.o. MRN#: [id number] Attending Physician: Judeth Trenda BIRCH, MD Primary Care Physician: Cloria Annabella CROME, DO Admit Date: 09/09/2023  Reason for Consultation/Follow-up: Establishing goals of care  Length of Stay: 10  Current Medications: Scheduled Meds:   arformoterol   15 mcg Nebulization BID   aspirin  EC  81 mg Oral Daily   atorvastatin   80 mg Oral QHS   benzonatate   100 mg Oral TID   budesonide  (PULMICORT ) nebulizer solution  0.5 mg Nebulization BID   busPIRone   7.5 mg Oral BID   dextromethorphan   30 mg Oral BID   enoxaparin  (LOVENOX ) injection  40 mg Subcutaneous Daily   ferrous sulfate   325 mg Oral Q breakfast   FLUoxetine   40 mg Oral Daily   gabapentin   400 mg Oral TID   guaiFENesin   600 mg Oral BID   insulin  aspart  0-20 Units Subcutaneous TID WC   insulin  aspart  0-5 Units Subcutaneous QHS   insulin  glargine-yfgn  12 Units Subcutaneous Daily   loratadine   10 mg Oral Daily   metoprolol  succinate  25 mg Oral Daily   nystatin    Topical BID   pantoprazole   40 mg Oral BID   revefenacin   175 mcg Nebulization Daily   rOPINIRole   0.25 mg Oral QHS   senna-docusate  2 tablet Oral BID   tamsulosin   0.4 mg Oral Daily   topiramate   50 mg Oral BID    Continuous Infusions:   PRN Meds: acetaminophen  **OR** acetaminophen , acetaminophen , diphenhydrAMINE , HYDROcodone  bit-homatropine, hydrOXYzine , ipratropium-albuterol , lip balm, Muscle Rub, mouth rinse, oxyCODONE , [EXPIRED] polyethylene glycol **FOLLOWED BY** polyethylene glycol, prochlorperazine , sodium chloride  flush  Physical Exam Vitals reviewed.  Constitutional:      General: She is not in acute distress. HENT:     Head:  Normocephalic and atraumatic.   Cardiovascular:     Rate and Rhythm: Normal rate.  Pulmonary:     Effort: Pulmonary effort is normal.     Comments: Cough and some SOB when talking  Skin:    General: Skin is warm and dry.   Neurological:     Mental Status: She is alert and oriented to person, place, and time.   Psychiatric:  Behavior: Behavior normal.             Vital Signs: BP 105/64 (BP Location: Right Arm)   Pulse 70   Temp 97.7 F (36.5 C) (Oral)   Resp 16   Ht 5' 2 (1.575 m)   Wt 78.1 kg   SpO2 100%   BMI 31.49 kg/m  SpO2: SpO2: 100 % O2 Device: O2 Device: Room Air    Patient Active Problem List   Diagnosis Date Noted   Atherosclerotic heart disease 09/14/2023   Acute HFrEF (heart failure with reduced ejection fraction) (HCC) 09/13/2023   Non-ST elevation (NSTEMI) myocardial infarction (HCC) 09/12/2023   Cardiomyopathy (HCC) 09/12/2023   HFrEF (heart failure with reduced ejection fraction) (HCC) 09/12/2023   Status post below-knee amputation of left lower extremity (HCC) 09/12/2023   COPD with acute exacerbation (HCC) 09/10/2023   CKD stage 3a, GFR 45-59 ml/min (HCC) 09/10/2023   COPD exacerbation (HCC) 09/10/2023   Fall at home, initial encounter 06/02/2023   Gangrene of toe of left foot (HCC)    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) 10/06/2021   Dyslipidemia 10/06/2021   Mood disorder (HCC) 10/06/2021   Cellulitis 08/19/2021   Hyperkalemia 08/19/2021   OSA (obstructive sleep apnea) 08/19/2021   Anxiety 08/04/2021   Hyperlipidemia 08/04/2021   Thrombocytosis 08/04/2021   Status post amputation of lesser toe of left foot (HCC) 06/21/2021   PAD (peripheral artery disease) (HCC) 06/12/2021   History of stroke 04/11/2021   Type 2 diabetes mellitus with diabetic peripheral angiopathy and gangrene, with long-term current use of insulin  (HCC) 04/03/2021   Left sided numbness 03/12/2021   Spinal stenosis of lumbar region  with neurogenic claudication 03/18/2019   GERD (gastroesophageal reflux disease) 12/12/2017   History of adenomatous polyp of colon 01/14/2017   Dysphagia 09/12/2016   Screening for colon cancer 09/12/2016   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 female  with past medical history of COPD, anxiety, HLD, CVA, PAD, status post L BKA, DM, admitted on 09/09/2023 with shortness of breath and chest pain that was presumed COPD exacerbation due to history of COVID infection 2 weeks prior.  Significant events during this hospitalization as follows:    6/19- Echocardiogram- EF 35-40%- suspected ischemia 6/20- NSTEMI- Cardiac cath- multiple vessels with stenosis, near occlusion- CT surgery consulted and recommended CABG with after stabilization of patient's respiratory status 6/21-pulmonology consulted for surgical clearance-noted patient with tracheolamacia-deemed her high risk but a possible candidate for CABG 6/24-cardiothoracic surgery reevaluated and determined patient was not a CABG candidate 6/25-attempted for repeat cath with atherectomy of LAD-however after minimal sedation patient had mental status change became hypotensive requiring Narcan  and the procedure had to be aborted-code stroke was called-CT of the head was negative for acute findings  Palliative medicine consulted as patient now has no further options for cardiac interventions.   Today's Discussion: I have reviewed medical records including EPIC notes, labs and imaging, assessed the patient and then met with the patient, her family (granddaughter Geofm and son  Joe at bedside and two other children  and granddaughter Clayborne by phone) and a [facility] representative to discuss diagnosis prognosis, GOC, EOL wishes, disposition and options. I introduced Palliative Medicine as specialized medical care for people living with serious illness. It focuses on providing relief from symptoms and stress of a serious illness. The goal is to improve quality of life for both the patient and the family.  We reviewed the patient's chronic conditions and acute hospitalization. We discussed the patient's severe atherosclerotic disease without options for cardiovascular interventions. We discussed cardiology's recommendation to move towards comfort measures.  We discussed advanced directives.  Patient would like to complete HCPOA document naming her son Larnell as Education officer, environmental.  She later shared that she might also include [place]borne or [place] and decision making.  Spiritual care consult she did for assistance with document creation. Recommended consideration of DNR status, understanding evidenced-based poor outcomes in similar hospitalized patients, as the cause of the arrest is likely associated with chronic/terminal disease rather than a reversible acute cardio-pulmonary event. Patient would like to remain full code at this time. We discussed the difference between an aggressive medical intervention path and a comfort focussed path for this patient. Discussed options moving forward. Patient shares that she has still not completed her bucket [place] item [place] (have her family push her on it). Patient was unable to make a decision on scope of care moving forward today-- we agreed to have another discussion on Sunday at 4:30pm. She remains full scope of care.  Prior to admission patient was living independently at home using a wheelchair for mobility. She had services provided by [facility]. Patient has 3 living children and several grandchildren. Patient enjoys doing crafts. In May she  enjoyed going to [employer].   Discussed the importance of continued conversation with family and the medical providers regarding overall plan of care and treatment options, ensuring decisions are within the context of the patient's values and GOCs.  Questions and concerns were addressed. Hard Choices booklet left for review. The family was encouraged to call with questions or concerns. PMT will continue to support holistically.   Recommendations/Plan: Full code Full scope of care-continue current interventions Encouraged patient and family to continue discussions around [facility] Spiritual care consult- HCPOA document and support PMT will continue to support- Plan to meet again to discuss goals Sunday 6/29 at 4:30 pm    Code Status:    Code Status Orders  (From admission, onward)           Start     Ordered   09/10/23 0115  Full code  Continuous       Question:  By:  Answer:  Consent: discussion documented in EHR   09/10/23 0115         Extensive chart review has been completed prior to seeing the patient including labs, vital signs, imaging, progress/consult notes, orders, medications, and available advance directive documents.  Care plan was discussed with Dr. Judeth and bedside RN  Time spent: 90 minutes  Thank you for allowing the Palliative Medicine Team to assist in the care of this patient.   Stephane CHRISTELLA Palin, NP  Please contact Palliative Medicine Team phone at [phone] for questions and concerns.

## 2023-09-20 NOTE — Care Management Important Message (Signed)
 Important Message  Patient Details  Name: Anna [name] MRN: [id number] Date of Birth: Jul 29, 1955   Important Message Given:  Yes - Medicare IM     Vonzell Arrie Sharps 09/20/2023, 11:41 AM

## 2023-09-20 NOTE — Progress Notes (Signed)
 PROGRESS NOTE   Anna [name]  FMW:[id number]    DOB: Apr 27, 1955    DOA: 09/09/2023  PCP: Cloria Annabella CROME, DO   I have briefly reviewed patients previous medical records in [facility].   Brief Hospital Course:   68 year old F with PMH of COPD, CVA, IDDM-2, left BKA, HTN, anxiety, depression and reported COVID-19 infection about 2 and half weeks prior for which she was treated with Paxlovid  presents with shortness of breath and cough, and admitted with COPD exacerbation.   In ED, she was slightly tachycardic and tachypneic. Cr 1.19.  WBC 11.4.  Hgb 9.8.  Platelet 484.  UA with large LE.  CXR without acute finding.  A 20 pathogen RVP nonreactive.  Patient was started on Solu-Medrol , ceftriaxone  and nebulizers and admitted. COVID-19 swab nonreactive.   CT angio chest negative for PE but scattered basilar predominant interstitial and groundglass opacities compatible with given history of recent COVID-19 pneumonia, borderline enlarged mediastinal adenopathy likely reactive and nonspecific mild periportal edema.  TTE with LVEF of 40 to 45%, RWMA, G1 DD and RVSP of 30 mmHg.  BNP elevated to 1300.  Troponin elevated to 385.  Cardiology consulted.   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  CTS consulted.  Not felt candidate for CABG.  Cardiology unsuccessfully attempted atherectomy of LAD on 6/25, procedure had to be aborted due to complications from periprocedural polypharmacy.  She has since recovered but does not wish to proceed with any aggressive interventions.  Palliative care has been consulted for goals of care and plan of family meeting on 6/27 to further address goals of care discussions.   Assessment & Plan:   NSTEMI Severe multivessel CAD Presented with chest pain.  Troponin up to 385 TTE showed LVEF of 40-45% with wall motion abnormalities LHC showed severe multivessel disease Cardiothoracic surgery consulted and they did not feel that she was a CABG candidate. Treated with IV  heparin , aspirin , Toprol , Lipitor . Cardiac cath with atherectomy of LAD was attempted on 6/25 but procedure had to be aborted due to complications: AMS, stopped breathing briefly, hypotension with BP in the 60s, asymmetric pupils, likely related to multiple medications during procedure, appears that code stroke was activated, CT head without acute findings, neurology consulted, appears to have returned to her baseline. Patient does not wish to proceed with any aggressive interventions.  Cardiology recommended palliative care input-they were consulted and plan family meeting 6/27. Per cardiology, continue medical management of NSTEMI with aspirin  81 Mg daily, atorvastatin  80 Mg daily, Toprol -XL 25 Mg daily.  Spironolactone  and losartan  were discontinued due to soft blood pressures.  Cardiology signed off 6/26.  Hyperlipidemia Continue Lipitor   HFmrEF Echo results as above BNP 1360 Underwent diuresis with IV Bumex .  Clinically euvolemic. Continue GDMT as above.  PAD s/p left BKA Continue aspirin  and statin.  Outpatient follow-up with vascular surgery  Asthma exacerbation/tracheomalacia/GERD/postviral from recent COVID-19 infection Workup as noted in Dr. Dela note from 6/24 Completed IV steroids Continue Brovana , Pulmicort , Yupelri , DuoNebs, PPI. Stable.  Type II DM/IDDM with hyperglycemia Continue current dose of Semglee  and SSI, gabapentin .  Sent only was reduced yesterday due to tight control of blood sugars.  No  Acute kidney injury complicating stage IIIa CKD Baseline creatinine 1.1 Suspected due to hypotensive episode and LHC Creatinine had peaked to 1.47 now down to 1.13  Anemia Stable.  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  They feel that the stent  is in good position and functioning well and they do not favor OR stent exchange this hospitalization given her acute cardiovascular and pulmonary  issues. Outpatient follow-up in 6 weeks with Dr. Devere.  Body mass index is 31.49 kg/m.   DVT prophylaxis: enoxaparin  (LOVENOX ) injection 40 mg Start: 09/19/23 1215 SCD's Start: 09/18/23 1557     Code Status: Full Code:  Family Communication: None at bedside. Disposition:  Awaiting palliative care team input to address goals of care with family     Consultants:   Cardiology Neurology Urology  Procedures:   As above  Subjective:  Working with mobility specialist.  Denied complaints.  No family at bedside.  States that they would be coming for meeting at 1:30 PM with palliative care team.  Objective:   Vitals:   09/20/23 0410 09/20/23 0746 09/20/23 0902 09/20/23 1153  BP: 106/66 104/64  105/64  Pulse: 71 68  70  Resp:  17  16  Temp: (!) 97 F (36.1 C) 97.8 F (36.6 C)  97.7 F (36.5 C)  TempSrc: Axillary Oral  Oral  SpO2: 100%  100%   Weight: 78.1 kg     Height:        General exam: Middle-age female, looks older than stated age, moderately built and nourished lying comfortably propped up in bed without distress. Respiratory system: Clear to auscultation.  No increased work of breathing. Cardiovascular system: S1 & S2 heard, RRR. No JVD, murmurs, rubs, gallops or clicks. No pedal edema.  Telemetry personally reviewed: Sinus rhythm with BBB morphology. Gastrointestinal system: Abdomen is nondistended, soft and nontender. No organomegaly or masses felt. Normal bowel sounds heard. Central nervous system: Alert and oriented.  Rest of mental status as noted above.  No focal neurological deficits. Extremities: Symmetric 5 x 5 power.  Healed left BKA stump. Skin: No rashes, lesions or ulcers Psychiatry: Judgement and insight appear normal. Mood & affect appropriate.     Data Reviewed:   I have personally reviewed following labs and imaging studies   CBC: Recent Labs  Lab 09/17/23 0457 09/18/23 0416 09/19/23 0428  WBC 12.9* 13.8* 9.3  HGB 9.7* 9.6* 9.1*  HCT  31.5* 31.6* 30.7*  MCV 90.3 93.8 93.0  PLT 404* 380 351    Basic Metabolic Panel: Recent Labs  Lab 09/15/23 0830 09/16/23 0500 09/17/23 0457 09/18/23 0416 09/19/23 0428  NA 137 139 134* 134* 134*  K 4.3 4.2 4.2 4.2 4.3  CL 105 103 104 106 105  CO2 23 22 24  17* 24  GLUCOSE 116* 82 90 71 103*  BUN 33* 31* 24* 20 20  CREATININE 1.26* 1.40* 1.29* 1.13* 1.13*  CALCIUM  8.6* 8.6* 8.1* 7.9* 8.2*  MG 2.2 2.2 2.2 2.2 2.3    Liver Function Tests: No results for input(s): AST, ALT, ALKPHOS, BILITOT, PROT, ALBUMIN in the last 168 hours.  CBG: Recent Labs  Lab 09/19/23 2134 09/20/23 0749 09/20/23 1158  GLUCAP 134* 93 126*    Microbiology Studies:   Recent Results (from the past 240 hours)  SARS Coronavirus 2 by RT PCR (hospital order, performed in [facility] hospital lab) *cepheid single result test* Anterior Nasal Swab     Status: None   Collection Time: 09/13/23  8:08 AM   Specimen: Anterior Nasal Swab  Result Value Ref Range Status   SARS Coronavirus 2 by RT PCR NEGATIVE NEGATIVE Final    Comment: Performed at [facility], [address] [place], [place] [address]    Radiology  Studies:  No results found.   Scheduled Meds:    arformoterol   15 mcg Nebulization BID   aspirin  EC  81 mg Oral Daily   atorvastatin   80 mg Oral QHS   benzonatate   100 mg Oral TID   budesonide  (PULMICORT ) nebulizer solution  0.5 mg Nebulization BID   busPIRone   7.5 mg Oral BID   dextromethorphan   30 mg Oral BID   enoxaparin  (LOVENOX ) injection  40 mg Subcutaneous Daily   ferrous sulfate   325 mg Oral Q breakfast   FLUoxetine   40 mg Oral Daily   gabapentin   400 mg Oral TID   guaiFENesin   600 mg Oral BID   insulin  aspart  0-20 Units Subcutaneous TID WC   insulin  aspart  0-5 Units Subcutaneous QHS   insulin  glargine-yfgn  12 Units Subcutaneous Daily   loratadine   10 mg Oral Daily   metoprolol  succinate  25 mg Oral Daily   nystatin    Topical BID   pantoprazole   40 mg  Oral BID   revefenacin   175 mcg Nebulization Daily   rOPINIRole   0.25 mg Oral QHS   senna-docusate  2 tablet Oral BID   tamsulosin   0.4 mg Oral Daily   topiramate   50 mg Oral BID    Continuous Infusions:       LOS: 10 days     Trenda Mar, MD,  FACP, [facility], [facility], [facility]   [facility] [place]      To contact the attending provider between 7A-7P or the covering provider during after hours 7P-7A, please log into the web site [email or web address] and access using universal [place] password for that web site. If you do not have the password, please call the hospital operator.  09/20/2023, 2:21 PM

## 2023-09-21 DIAGNOSIS — Z7189 Other specified counseling: Secondary | ICD-10-CM | POA: Diagnosis not present

## 2023-09-21 DIAGNOSIS — Z515 Encounter for palliative care: Secondary | ICD-10-CM | POA: Diagnosis not present

## 2023-09-21 DIAGNOSIS — J441 Chronic obstructive pulmonary disease with (acute) exacerbation: Secondary | ICD-10-CM | POA: Diagnosis not present

## 2023-09-21 LAB — GLUCOSE, CAPILLARY
Glucose-Capillary: 138 mg/dL — ABNORMAL HIGH (ref 70–99)
Glucose-Capillary: 176 mg/dL — ABNORMAL HIGH (ref 70–99)
Glucose-Capillary: 103 mg/dL — ABNORMAL HIGH (ref 70–99)
Glucose-Capillary: 65 mg/dL — ABNORMAL LOW (ref 70–99)
Glucose-Capillary: 291 mg/dL — ABNORMAL HIGH (ref 70–99)

## 2023-09-21 NOTE — Progress Notes (Signed)
 Daily Progress Note   Patient Name: Anna [name]       Date: 09/21/2023 DOB: April 24, 1955  Age: 68 y.o. MRN#: [id number] Attending Physician: Judeth Trenda BIRCH, MD Primary Care Physician: Cloria Annabella CROME, DO Admit Date: 09/09/2023  Reason for Consultation/Follow-up: Establishing goals of care  Length of Stay: 11  Current Medications: Scheduled Meds:   arformoterol   15 mcg Nebulization BID   aspirin  EC  81 mg Oral Daily   atorvastatin   80 mg Oral QHS   benzonatate   100 mg Oral TID   budesonide  (PULMICORT ) nebulizer solution  0.5 mg Nebulization BID   busPIRone   7.5 mg Oral BID   dextromethorphan   30 mg Oral BID   enoxaparin  (LOVENOX ) injection  40 mg Subcutaneous Daily   ferrous sulfate   325 mg Oral Q breakfast   FLUoxetine   40 mg Oral Daily   gabapentin   400 mg Oral TID   guaiFENesin   600 mg Oral BID   insulin  aspart  0-20 Units Subcutaneous TID WC   insulin  aspart  0-5 Units Subcutaneous QHS   insulin  glargine-yfgn  12 Units Subcutaneous Daily   loratadine   10 mg Oral Daily   metoprolol  succinate  25 mg Oral Daily   nystatin    Topical BID   pantoprazole   40 mg Oral BID   revefenacin   175 mcg Nebulization Daily   rOPINIRole   0.25 mg Oral QHS   senna-docusate  2 tablet Oral BID   tamsulosin   0.4 mg Oral Daily   topiramate   50 mg Oral BID    Continuous Infusions:   PRN Meds: acetaminophen  **OR** acetaminophen , acetaminophen , diphenhydrAMINE , HYDROcodone  bit-homatropine, hydrOXYzine , ipratropium-albuterol , lip balm, Muscle Rub, mouth rinse, oxyCODONE , [EXPIRED] polyethylene glycol **FOLLOWED BY** polyethylene glycol, prochlorperazine , sodium chloride  flush  Physical Exam Vitals reviewed.  Constitutional:      General: She is not in acute distress. HENT:     Head:  Normocephalic and atraumatic.   Cardiovascular:     Rate and Rhythm: Normal rate.  Pulmonary:     Effort: Pulmonary effort is normal.     Comments: Cough and some SOB when talking  Skin:    General: Skin is warm and dry.   Neurological:     Mental Status: She is alert and oriented to person, place, and time.   Psychiatric:  Behavior: Behavior normal.             Vital Signs: BP 117/70   Pulse 71   Temp 98.2 F (36.8 C) (Oral)   Resp 20   Ht 5' 2 (1.575 m)   Wt 78.5 kg   SpO2 100%   BMI 31.65 kg/m  SpO2: SpO2: 100 % O2 Device: O2 Device: Room Air    Patient Active Problem List   Diagnosis Date Noted   Atherosclerotic heart disease 09/14/2023   Acute HFrEF (heart failure with reduced ejection fraction) (HCC) 09/13/2023   Non-ST elevation (NSTEMI) myocardial infarction (HCC) 09/12/2023   Cardiomyopathy (HCC) 09/12/2023   HFrEF (heart failure with reduced ejection fraction) (HCC) 09/12/2023   Status post below-knee amputation of left lower extremity (HCC) 09/12/2023   COPD with acute exacerbation (HCC) 09/10/2023   CKD stage 3a, GFR 45-59 ml/min (HCC) 09/10/2023   COPD exacerbation (HCC) 09/10/2023   Fall at home, initial encounter 06/02/2023   Gangrene of toe of left foot (HCC)    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) 10/06/2021   Dyslipidemia 10/06/2021   Mood disorder (HCC) 10/06/2021   Cellulitis 08/19/2021   Hyperkalemia 08/19/2021   OSA (obstructive sleep apnea) 08/19/2021   Anxiety 08/04/2021   Hyperlipidemia 08/04/2021   Thrombocytosis 08/04/2021   Status post amputation of lesser toe of left foot (HCC) 06/21/2021   PAD (peripheral artery disease) (HCC) 06/12/2021   History of stroke 04/11/2021   Type 2 diabetes mellitus with diabetic peripheral angiopathy and gangrene, with long-term current use of insulin  (HCC) 04/03/2021   Left sided numbness 03/12/2021   Spinal stenosis of lumbar region with neurogenic  claudication 03/18/2019   GERD (gastroesophageal reflux disease) 12/12/2017   History of adenomatous polyp of colon 01/14/2017   Dysphagia 09/12/2016   Screening for colon cancer 09/12/2016   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 female  with past medical history of COPD, anxiety, HLD, CVA, PAD, status post L BKA, DM, admitted on 09/09/2023 with shortness of breath and chest pain that was presumed COPD exacerbation due to history of COVID infection 2 weeks prior.  Significant events during this hospitalization as follows:    6/19- Echocardiogram- EF 35-40%- suspected ischemia 6/20- NSTEMI- Cardiac cath- multiple vessels with stenosis, near occlusion- CT surgery consulted and recommended CABG with after stabilization of patient's respiratory status 6/21-pulmonology consulted for surgical clearance-noted patient with tracheolamacia-deemed her high risk but a possible candidate for CABG 6/24-cardiothoracic surgery reevaluated and determined patient was not a CABG candidate 6/25-attempted for repeat cath with atherectomy of LAD-however after minimal sedation patient had mental status change became hypotensive requiring Narcan  and the procedure had to be aborted-code stroke was called-CT of the head was negative for acute findings  Palliative medicine consulted as patient now has no further options for cardiac interventions.   Today's Discussion: Patient sitting up in bed. She called her granddaughter Emmie to be on the phone during our conversation.  We reviewed our discussion around goals of care yesterday. The patient  shared she has been easily upset since  our conversation yesterday.  We discussed that it is normal to have a lot of feelings around goals of care discussion.  Answered questions around options moving forward including continuing on current treatment path or pivoting to a more comfort focused path.  Discussed code status again.  It seems the patient is leaning towards a do not attempt resuscitation status but is not quite ready to make that decision yet.  I encouraged her to discuss options with her family prior to our meeting tomorrow at 4:30 PM.  Patient shared that she has been a part of several individuals end-of-life experience.  These experiences have shaped her and helped her through life and she suspects they impact her decisions moving forward.  Emotional support and therapeutic listening provided.   Discussed the importance of continued conversation with family and the medical providers regarding overall plan of care and treatment options, ensuring decisions are within the context of the patient's values and GOCs.  Questions and concerns were addressed. The family was encouraged to call with questions or concerns. PMT will continue to support holistically.   Recommendations/Plan: Full code Full scope of care-continue current interventions Encouraged patient and family to continue discussions around [facility] Spiritual care consult- HCPOA document and support PMT will continue to support- Plan to meet again to discuss goals Sunday 6/29 at 4:30 pm    Code Status:    Code Status Orders  (From admission, onward)           Start     Ordered   09/10/23 0115  Full code  Continuous       Question:  By:  Answer:  Consent: discussion documented in EHR   09/10/23 0115         Extensive chart review has been completed prior to seeing the patient including labs, vital signs, imaging, progress/consult notes, orders, medications, and available advance directive documents.  Care plan was  discussed with bedside RN  Time spent: 70 minutes  Thank you for allowing the Palliative Medicine Team to assist in the care of this patient.   Stephane CHRISTELLA Palin, NP  Please contact Palliative Medicine Team phone at [phone] for questions and concerns.

## 2023-09-21 NOTE — Progress Notes (Signed)
 PROGRESS NOTE   Anna [name]  FMW:[id number]    DOB: 1955/04/02    DOA: 09/09/2023  PCP: Cloria Annabella CROME, DO   I have briefly reviewed patients previous medical records in [facility].   Brief Hospital Course:   68 year old F with PMH of COPD, CVA, IDDM-2, left BKA, HTN, anxiety, depression and reported COVID-19 infection about 2 and half weeks prior for which she was treated with Paxlovid  presents with shortness of breath and cough, and admitted with COPD exacerbation.   In ED, she was slightly tachycardic and tachypneic. Cr 1.19.  WBC 11.4.  Hgb 9.8.  Platelet 484.  UA with large LE.  CXR without acute finding.  A 20 pathogen RVP nonreactive.  Patient was started on Solu-Medrol , ceftriaxone  and nebulizers and admitted. COVID-19 swab nonreactive.   CT angio chest negative for PE but scattered basilar predominant interstitial and groundglass opacities compatible with given history of recent COVID-19 pneumonia, borderline enlarged mediastinal adenopathy likely reactive and nonspecific mild periportal edema.  TTE with LVEF of 40 to 45%, RWMA, G1 DD and RVSP of 30 mmHg.  BNP elevated to 1300.  Troponin elevated to 385.  Cardiology consulted.   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  CTS consulted.  Not felt candidate for CABG.  Cardiology unsuccessfully attempted atherectomy of LAD on 6/25, procedure had to be aborted due to complications from periprocedural polypharmacy.  She has since recovered but does not wish to proceed with any aggressive interventions.  Palliative care to meet regularly to address goals of care.   Assessment & Plan:   NSTEMI Severe multivessel CAD Presented with chest pain.  Troponin up to 385 TTE showed LVEF of 40-45% with wall motion abnormalities LHC showed severe multivessel disease Cardiothoracic surgery consulted and they did not feel that she was a CABG candidate. Treated with IV heparin , aspirin , Toprol , Lipitor . Cardiac cath with atherectomy of LAD was  attempted on 6/25 but procedure had to be aborted due to complications: AMS, stopped breathing briefly, hypotension with BP in the 60s, asymmetric pupils, likely related to multiple medications during procedure, appears that code stroke was activated, CT head without acute findings, neurology consulted, appears to have returned to her baseline. Patient does not wish to proceed with any aggressive interventions.  Cardiology recommended palliative care input-they were consulted and plan family meeting 6/27. Per cardiology, continue medical management of NSTEMI with aspirin  81 Mg daily, atorvastatin  80 Mg daily, Toprol -XL 25 Mg daily.  Spironolactone  and losartan  were discontinued due to soft blood pressures.  Cardiology signed off 6/26.  Hyperlipidemia Continue Lipitor   HFmrEF Echo results as above BNP 1360 Underwent diuresis with IV Bumex .  Clinically euvolemic. Continue GDMT as above.  PAD s/p left BKA Continue aspirin  and statin.  Outpatient follow-up with vascular surgery  Asthma exacerbation/tracheomalacia/GERD/postviral from recent COVID-19 infection Workup as noted in Dr. Dela note from 6/24 Completed IV steroids Continue Brovana , Pulmicort , Yupelri , DuoNebs, PPI. Stable.  Type II DM/IDDM with hyperglycemia Continue current dose of Semglee  and SSI, gabapentin .  Sent only was reduced yesterday due to tight control of blood sugars.  No  Acute kidney injury complicating stage IIIa CKD Baseline creatinine 1.1 Suspected due to hypotensive episode and LHC Creatinine had peaked to 1.47 now down to 1.13  Anemia Stable.  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  They feel that the stent is in good position and functioning well and they do not favor OR  stent exchange this hospitalization given her acute cardiovascular and pulmonary issues. Outpatient follow-up in 6 weeks with Dr. Devere.  Adult failure to  thrive/goals of care Closely following palliative care input. Discussed with patient in detail.  Advised her that this is a complex situation but at the current time, there is no ongoing hospital level of care.  Encouraged her to discuss with her family and advance decisions regarding goals of care.  She is medically ready for discharge, likely to SNF on Monday.  She verbalized understanding.  Body mass index is 31.65 kg/m.   DVT prophylaxis: enoxaparin  (LOVENOX ) injection 40 mg Start: 09/19/23 1215 SCD's Start: 09/18/23 1557     Code Status: Full Code:  Family Communication: None at bedside. Disposition:  Awaiting palliative care team input to address goals of care with family     Consultants:   Cardiology Neurology Urology  Procedures:   As above  Subjective:  No complaints.  Per nursing, she was tied up this morning due to money missing from her bank account but finally she realized that one of her family members had withdrawn the money.  Objective:   Vitals:   09/21/23 0203 09/21/23 0521 09/21/23 0822 09/21/23 1230  BP:  104/79 108/68 117/70  Pulse: 70 64  71  Resp: 19 14 20 20   Temp:  97.9 F (36.6 C) 97.8 F (36.6 C) 98.2 F (36.8 C)  TempSrc:  Axillary Oral Oral  SpO2: 100% 100% 100% 100%  Weight:  78.5 kg    Height:        General exam: Middle-age female, looks older than stated age, moderately built and nourished lying comfortably propped up in bed without distress. Respiratory system: Clear to auscultation.  No increased work of breathing. Cardiovascular system: S1 & S2 heard, RRR. No JVD, murmurs, rubs, gallops or clicks. No pedal edema.  Telemetry personally reviewed: Remains in sinus rhythm. Gastrointestinal system: Abdomen is nondistended, soft and nontender. No organomegaly or masses felt. Normal bowel sounds heard. Central nervous system: Alert and oriented.  Rest of mental status as noted above.  No focal neurological deficits. Extremities: Symmetric  5 x 5 power.  Healed left BKA stump. Skin: No rashes, lesions or ulcers Psychiatry: Judgement and insight appear normal. Mood & affect appropriate.     Data Reviewed:   I have personally reviewed following labs and imaging studies   CBC: Recent Labs  Lab 09/17/23 0457 09/18/23 0416 09/19/23 0428  WBC 12.9* 13.8* 9.3  HGB 9.7* 9.6* 9.1*  HCT 31.5* 31.6* 30.7*  MCV 90.3 93.8 93.0  PLT 404* 380 351    Basic Metabolic Panel: Recent Labs  Lab 09/15/23 0830 09/16/23 0500 09/17/23 0457 09/18/23 0416 09/19/23 0428  NA 137 139 134* 134* 134*  K 4.3 4.2 4.2 4.2 4.3  CL 105 103 104 106 105  CO2 23 22 24  17* 24  GLUCOSE 116* 82 90 71 103*  BUN 33* 31* 24* 20 20  CREATININE 1.26* 1.40* 1.29* 1.13* 1.13*  CALCIUM  8.6* 8.6* 8.1* 7.9* 8.2*  MG 2.2 2.2 2.2 2.2 2.3    Liver Function Tests: No results for input(s): AST, ALT, ALKPHOS, BILITOT, PROT, ALBUMIN in the last 168 hours.  CBG: Recent Labs  Lab 09/20/23 2134 09/21/23 0808 09/21/23 1251  GLUCAP 168* 138* 176*    Microbiology Studies:   Recent Results (from the past 240 hours)  SARS Coronavirus 2 by RT PCR (hospital order, performed in [facility] hospital lab) *cepheid single result test* Anterior  Nasal Swab     Status: None   Collection Time: 09/13/23  8:08 AM   Specimen: Anterior Nasal Swab  Result Value Ref Range Status   SARS Coronavirus 2 by RT PCR NEGATIVE NEGATIVE Final    Comment: Performed at [facility], [address], [place], [place] [address]    Radiology Studies:  No results found.   Scheduled Meds:    arformoterol   15 mcg Nebulization BID   aspirin  EC  81 mg Oral Daily   atorvastatin   80 mg Oral QHS   benzonatate   100 mg Oral TID   budesonide  (PULMICORT ) nebulizer solution  0.5 mg Nebulization BID   busPIRone   7.5 mg Oral BID   dextromethorphan   30 mg Oral BID   enoxaparin  (LOVENOX ) injection  40 mg Subcutaneous Daily   ferrous sulfate   325 mg Oral Q breakfast    FLUoxetine   40 mg Oral Daily   gabapentin   400 mg Oral TID   guaiFENesin   600 mg Oral BID   insulin  aspart  0-20 Units Subcutaneous TID WC   insulin  aspart  0-5 Units Subcutaneous QHS   insulin  glargine-yfgn  12 Units Subcutaneous Daily   loratadine   10 mg Oral Daily   metoprolol  succinate  25 mg Oral Daily   nystatin    Topical BID   pantoprazole   40 mg Oral BID   revefenacin   175 mcg Nebulization Daily   rOPINIRole   0.25 mg Oral QHS   senna-docusate  2 tablet Oral BID   tamsulosin   0.4 mg Oral Daily   topiramate   50 mg Oral BID    Continuous Infusions:       LOS: 11 days     Trenda Mar, MD,  FACP, [facility], [facility], [facility]   [facility] [place]      To contact the attending provider between 7A-7P or the covering provider during after hours 7P-7A, please log into the web site [email or web address] and access using universal [place] password for that web site. If you do not have the password, please call the hospital operator.  09/21/2023, 3:59 PM

## 2023-09-22 DIAGNOSIS — J441 Chronic obstructive pulmonary disease with (acute) exacerbation: Secondary | ICD-10-CM | POA: Diagnosis not present

## 2023-09-22 DIAGNOSIS — I214 Non-ST elevation (NSTEMI) myocardial infarction: Secondary | ICD-10-CM | POA: Diagnosis not present

## 2023-09-22 DIAGNOSIS — Z515 Encounter for palliative care: Secondary | ICD-10-CM | POA: Diagnosis not present

## 2023-09-22 DIAGNOSIS — Z7189 Other specified counseling: Secondary | ICD-10-CM | POA: Diagnosis not present

## 2023-09-22 DIAGNOSIS — R053 Chronic cough: Secondary | ICD-10-CM | POA: Diagnosis not present

## 2023-09-22 LAB — GLUCOSE, CAPILLARY
Glucose-Capillary: 134 mg/dL — ABNORMAL HIGH (ref 70–99)
Glucose-Capillary: 136 mg/dL — ABNORMAL HIGH (ref 70–99)
Glucose-Capillary: 308 mg/dL — ABNORMAL HIGH (ref 70–99)
Glucose-Capillary: 58 mg/dL — ABNORMAL LOW (ref 70–99)
Glucose-Capillary: 77 mg/dL (ref 70–99)
Glucose-Capillary: 91 mg/dL (ref 70–99)

## 2023-09-22 MED ORDER — DEXTROSE 50 % IV SOLN
25.0000 mL | Freq: Once | INTRAVENOUS | Status: AC
  Administered 2023-09-22: 25 mL via INTRAVENOUS
  Filled 2023-09-22: qty 50

## 2023-09-22 MED ORDER — DEXTROSE 50 % IV SOLN: 25.0000 mL | Freq: Once | INTRAVENOUS | Status: DC

## 2023-09-22 MED ORDER — PHENOL 1.4 % MT LIQD: 1.0000 | OROMUCOSAL | Status: DC | PRN

## 2023-09-22 NOTE — Progress Notes (Signed)
 PROGRESS NOTE   Anna [name]  FMW:[id number]    DOB: 1955/05/13    DOA: 09/09/2023  PCP: Anna Annabella CROME, DO   I have briefly reviewed patients previous medical records in [facility].   Brief Hospital Course:   68 year old F with PMH of COPD, CVA, IDDM-2, left BKA, HTN, anxiety, depression and reported COVID-19 infection about 2 and half weeks prior for which she was treated with Paxlovid  presents with shortness of breath and cough, and admitted with COPD exacerbation.   In ED, she was slightly tachycardic and tachypneic. Cr 1.19.  WBC 11.4.  Hgb 9.8.  Platelet 484.  UA with large LE.  CXR without acute finding.  A 20 pathogen RVP nonreactive.  Patient was started on Solu-Medrol , ceftriaxone  and nebulizers and admitted. COVID-19 swab nonreactive.   CT angio chest negative for PE but scattered basilar predominant interstitial and groundglass opacities compatible with given history of recent COVID-19 pneumonia, borderline enlarged mediastinal adenopathy likely reactive and nonspecific mild periportal edema.  TTE with LVEF of 40 to 45%, RWMA, G1 DD and RVSP of 30 mmHg.  BNP elevated to 1300.  Troponin elevated to 385.  Cardiology consulted.   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  CTS consulted.  Not felt candidate for CABG.  Cardiology unsuccessfully attempted atherectomy of LAD on 6/25, procedure had to be aborted due to complications from periprocedural polypharmacy.  She has since recovered but does not wish to proceed with any aggressive interventions.  Palliative care meetings ongoing, planning on family meeting on 6/29 at 1 PM.   Assessment & Plan:   NSTEMI Severe multivessel CAD Presented with chest pain.  Troponin up to 385 TTE showed LVEF of 40-45% with wall motion abnormalities LHC showed severe multivessel disease Cardiothoracic surgery consulted and they did not feel that she was a CABG candidate. Treated with IV heparin , aspirin , Toprol , Lipitor . Cardiac cath with  atherectomy of LAD was attempted on 6/25 but procedure had to be aborted due to complications: AMS, stopped breathing briefly, hypotension with BP in the 60s, asymmetric pupils, likely related to multiple medications during procedure, appears that code stroke was activated, CT head without acute findings, neurology consulted, appears to have returned to her baseline. Patient does not wish to proceed with any aggressive interventions.  Cardiology recommended palliative care input-they were consulted and plan family meeting 6/27. Per cardiology, continue medical management of NSTEMI with aspirin  81 Mg daily, atorvastatin  80 Mg daily, Toprol -XL 25 Mg daily.  Spironolactone  and losartan  were discontinued due to soft blood pressures.  Cardiology signed off 6/26.  Hyperlipidemia Continue Lipitor   HFmrEF Echo results as above BNP 1360 Underwent diuresis with IV Bumex .  Clinically euvolemic. Continue GDMT as above.  PAD s/p left BKA Continue aspirin  and statin.  Outpatient follow-up with vascular surgery  Asthma exacerbation/tracheomalacia/GERD/postviral from recent COVID-19 infection Workup as noted in Dr. Dela note from 6/24 Completed IV steroids Continue Brovana , Pulmicort , Yupelri , DuoNebs, PPI. Stable.  Type II DM/IDDM with hyperglycemia Continue current dose of Semglee  and SSI, gabapentin .  Sent only was reduced yesterday due to tight control of blood sugars.  No  Acute kidney injury complicating stage IIIa CKD Baseline creatinine 1.1 Suspected due to hypotensive episode and LHC Creatinine had peaked to 1.47 now down to 1.13  Anemia Stable.  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  They feel that the stent is in good position and functioning well and they do  not favor OR stent exchange this hospitalization given her acute cardiovascular and pulmonary issues. Outpatient follow-up in 6 weeks with Dr.  Devere.  Adult failure to thrive/goals of care Closely following palliative care input. Discussed with patient in detail.  Advised her that this is a complex situation but at the current time, there is no ongoing hospital level of care.  Encouraged her to discuss with her family and advance decisions regarding goals of care.  She is medically ready for discharge, likely to SNF on Monday.  She verbalized understanding. As per patient and granddaughter at bedside, family meeting has been preformed from 4 PM to 1 PM on 6/29.  Multiple family members plan to be at the meeting.  Body mass index is 32.15 kg/m.   DVT prophylaxis: enoxaparin  (LOVENOX ) injection 40 mg Start: 09/19/23 1215 SCD's Start: 09/18/23 1557     Code Status: Full Code:  Family Communication: Grandaughter at bedside. Disposition:  Awaiting palliative care team input to address goals of care with family     Consultants:   Cardiology Neurology Urology  Procedures:   As above  Subjective:  Chronic cough, ongoing since beginning of June, intermittent green/yellow sputum.  Already on multiple cough medicines, continue.  Objective:   Vitals:   09/22/23 0315 09/22/23 0440 09/22/23 0732 09/22/23 0820  BP: (!) 112/51   (!) 118/7  Pulse: 67   79  Resp: 14     Temp: 97.7 F (36.5 C)     TempSrc: Oral   Oral  SpO2: 99%  98% 93%  Weight:  79.7 kg    Height:        General exam: Middle-age female, looks older than stated age, moderately built and nourished lying comfortably propped up in bed without distress.  Intermittent coughing. Respiratory system: Clear to auscultation.  No increased work of breathing. Cardiovascular system: S1 & S2 heard, RRR. No JVD, murmurs, rubs, gallops or clicks. No pedal edema.  Telemetry personally reviewed: Sinus rhythm, occasional PVCs, occasional bigeminal.  BBB morphology. Gastrointestinal system: Abdomen is nondistended, soft and nontender. No organomegaly or masses felt. Normal  bowel sounds heard. Central nervous system: Alert and oriented.  Rest of mental status as noted above.  No focal neurological deficits. Extremities: Symmetric 5 x 5 power.  Healed left BKA stump. Skin: No rashes, lesions or ulcers Psychiatry: Judgement and insight appear normal. Mood & affect appropriate.     Data Reviewed:   I have personally reviewed following labs and imaging studies   CBC: Recent Labs  Lab 09/17/23 0457 09/18/23 0416 09/19/23 0428  WBC 12.9* 13.8* 9.3  HGB 9.7* 9.6* 9.1*  HCT 31.5* 31.6* 30.7*  MCV 90.3 93.8 93.0  PLT 404* 380 351    Basic Metabolic Panel: Recent Labs  Lab 09/16/23 0500 09/17/23 0457 09/18/23 0416 09/19/23 0428  NA 139 134* 134* 134*  K 4.2 4.2 4.2 4.3  CL 103 104 106 105  CO2 22 24 17* 24  GLUCOSE 82 90 71 103*  BUN 31* 24* 20 20  CREATININE 1.40* 1.29* 1.13* 1.13*  CALCIUM  8.6* 8.1* 7.9* 8.2*  MG 2.2 2.2 2.2 2.3    Liver Function Tests: No results for input(s): AST, ALT, ALKPHOS, BILITOT, PROT, ALBUMIN in the last 168 hours.  CBG: Recent Labs  Lab 09/21/23 1700 09/21/23 2140 09/22/23 0825  GLUCAP 103* 291* 136*    Microbiology Studies:   Recent Results (from the past 240 hours)  SARS Coronavirus 2 by RT PCR (hospital order, performed  in [facility] hospital lab) *cepheid single result test* Anterior Nasal Swab     Status: None   Collection Time: 09/13/23  8:08 AM   Specimen: Anterior Nasal Swab  Result Value Ref Range Status   SARS Coronavirus 2 by RT PCR NEGATIVE NEGATIVE Final    Comment: Performed at [facility], [address] [place], [place] [address]    Radiology Studies:  No results found.   Scheduled Meds:    arformoterol   15 mcg Nebulization BID   aspirin  EC  81 mg Oral Daily   atorvastatin   80 mg Oral QHS   benzonatate   100 mg Oral TID   budesonide  (PULMICORT ) nebulizer solution  0.5 mg Nebulization BID   busPIRone   7.5 mg Oral BID   dextromethorphan   30 mg Oral BID    enoxaparin  (LOVENOX ) injection  40 mg Subcutaneous Daily   ferrous sulfate   325 mg Oral Q breakfast   FLUoxetine   40 mg Oral Daily   gabapentin   400 mg Oral TID   guaiFENesin   600 mg Oral BID   insulin  aspart  0-20 Units Subcutaneous TID WC   insulin  aspart  0-5 Units Subcutaneous QHS   insulin  glargine-yfgn  12 Units Subcutaneous Daily   loratadine   10 mg Oral Daily   metoprolol  succinate  25 mg Oral Daily   nystatin    Topical BID   pantoprazole   40 mg Oral BID   revefenacin   175 mcg Nebulization Daily   rOPINIRole   0.25 mg Oral QHS   senna-docusate  2 tablet Oral BID   tamsulosin   0.4 mg Oral Daily   topiramate   50 mg Oral BID    Continuous Infusions:       LOS: 12 days     Trenda Mar, MD,  FACP, [facility], [facility], [facility]   [facility] [place]      To contact the attending provider between 7A-7P or the covering provider during after hours 7P-7A, please log into the web site [email or web address] and access using universal [place] password for that web site. If you do not have the password, please call the hospital operator.  09/22/2023, 10:22 AM

## 2023-09-22 NOTE — Progress Notes (Signed)
 Daily Progress Note   Patient Name: Anna [name]       Date: 09/22/2023 DOB: [birth year]  Age: 68 y.o. MRN#: [id number] Attending Physician: Judeth Trenda BIRCH, MD Primary Care Physician: Cloria Annabella CROME, DO Admit Date: 09/09/2023  Reason for Consultation/Follow-up: Establishing goals of care  Length of Stay: 12  Current Medications: Scheduled Meds:   arformoterol   15 mcg Nebulization BID   aspirin  EC  81 mg Oral Daily   atorvastatin   80 mg Oral QHS   benzonatate   100 mg Oral TID   budesonide  (PULMICORT ) nebulizer solution  0.5 mg Nebulization BID   busPIRone   7.5 mg Oral BID   dextromethorphan   30 mg Oral BID   enoxaparin  (LOVENOX ) injection  40 mg Subcutaneous Daily   ferrous sulfate   325 mg Oral Q breakfast   FLUoxetine   40 mg Oral Daily   gabapentin   400 mg Oral TID   guaiFENesin   600 mg Oral BID   insulin  aspart  0-20 Units Subcutaneous TID WC   insulin  aspart  0-5 Units Subcutaneous QHS   insulin  glargine-yfgn  12 Units Subcutaneous Daily   loratadine   10 mg Oral Daily   metoprolol  succinate  25 mg Oral Daily   nystatin    Topical BID   pantoprazole   40 mg Oral BID   revefenacin   175 mcg Nebulization Daily   rOPINIRole   0.25 mg Oral QHS   senna-docusate  2 tablet Oral BID   tamsulosin   0.4 mg Oral Daily   topiramate   50 mg Oral BID    Continuous Infusions:   PRN Meds: acetaminophen  **OR** acetaminophen , acetaminophen , diphenhydrAMINE , HYDROcodone  bit-homatropine, hydrOXYzine , ipratropium-albuterol , lip balm, Muscle Rub, mouth rinse, oxyCODONE , [EXPIRED] polyethylene glycol **FOLLOWED BY** polyethylene glycol, prochlorperazine , sodium chloride  flush  Physical Exam Vitals reviewed.  Constitutional:      General: She is not in acute distress. HENT:     Head:  Normocephalic and atraumatic.   Cardiovascular:     Rate and Rhythm: Normal rate.  Pulmonary:     Effort: Pulmonary effort is normal.   Skin:    General: Skin is warm and dry.   Neurological:     Mental Status: She is alert and oriented to person, place, and time.   Psychiatric:        Behavior: Behavior normal.  Vital Signs: BP (!) 118/7   Pulse 79   Temp 97.7 F (36.5 C) (Oral)   Resp 14   Ht 5' 2 (1.575 m)   Wt 79.7 kg   SpO2 93%   BMI 32.15 kg/m  SpO2: SpO2: 93 % O2 Device: O2 Device: Room Air    Patient Active Problem List   Diagnosis Date Noted   Atherosclerotic heart disease 09/14/2023   Acute HFrEF (heart failure with reduced ejection fraction) (HCC) 09/13/2023   Non-ST elevation (NSTEMI) myocardial infarction (HCC) 09/12/2023   Cardiomyopathy (HCC) 09/12/2023   HFrEF (heart failure with reduced ejection fraction) (HCC) 09/12/2023   Status post below-knee amputation of left lower extremity (HCC) 09/12/2023   COPD with acute exacerbation (HCC) 09/10/2023   CKD stage 3a, GFR 45-59 ml/min (HCC) 09/10/2023   COPD exacerbation (HCC) 09/10/2023   Fall at home, initial encounter 06/02/2023   Gangrene of toe of left foot (HCC)    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) 10/06/2021   Dyslipidemia 10/06/2021   Mood disorder (HCC) 10/06/2021   Cellulitis 08/19/2021   Hyperkalemia 08/19/2021   OSA (obstructive sleep apnea) 08/19/2021   Anxiety 08/04/2021   Hyperlipidemia 08/04/2021   Thrombocytosis 08/04/2021   Status post amputation of lesser toe of left foot (HCC) 06/21/2021   PAD (peripheral artery disease) (HCC) 06/12/2021   History of stroke 04/11/2021   Type 2 diabetes mellitus with diabetic peripheral angiopathy and gangrene, with long-term current use of insulin  (HCC) 04/03/2021   Left sided numbness 03/12/2021   Spinal stenosis of lumbar region with neurogenic claudication 03/18/2019   GERD (gastroesophageal  reflux disease) 12/12/2017   History of adenomatous polyp of colon 01/14/2017   Dysphagia 09/12/2016   Screening for colon cancer 09/12/2016   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 female  with past medical history of COPD, anxiety, HLD, CVA, PAD, status post L BKA, DM, admitted on 09/09/2023 with shortness of breath and chest pain that was presumed COPD exacerbation due to history of COVID infection 2 weeks prior.  Significant events during this hospitalization as follows:    6/19- Echocardiogram- EF 35-40%- suspected ischemia 6/20- NSTEMI- Cardiac cath- multiple vessels with stenosis, near occlusion- CT surgery consulted and recommended CABG with after stabilization of patient's respiratory status 6/21-pulmonology consulted for surgical clearance-noted patient with tracheolamacia-deemed her high risk but a possible candidate for CABG 6/24-cardiothoracic surgery reevaluated and determined patient was not a CABG candidate 6/25-attempted for repeat cath with atherectomy of LAD-however after minimal sedation patient had mental status change became hypotensive requiring Narcan  and the procedure had to be aborted-code stroke was called-CT of the head was negative for acute findings  Palliative medicine consulted as patient now has no further options for cardiac interventions.   Today's Discussion: Patient sitting on side of bed. She reports sitting up has improved her breathing and cough. Family at bedside and on phone for goals of care discussion. Edna Eagles from [facility] is also at bedside for meeting. We discussed HCPOA.  Patient would like to complete these before discharge. She would like  Joe to be her proxy decision maker and Clayborne to be second Management consultant. We discussed code status. Patient confirms she would like to change to do not attempt resuscitation. We discussed scopes of care. Patient would not want to be intubated- changed to DNI. Patient shared that at this time she would like to continue to treat the treatable. She has things she would still like to do including she like to climb the [place] trail (be pushed by her family). She wants to see if she can get increased function at discharge by going to [facility]. We discussed scopes of care moving forward. Patient shared that if her condition were to worsen she would want to avoid rehospitalization and at that time would want to transition to comfort measures. Questions answered about what this could look like.  MOST form was discussed and completed. The patient outlined her wishes for the following treatment decisions:  Cardiopulmonary Resuscitation: Do Not Attempt Resuscitation (DNR/No CPR)  Medical Interventions: Comfort Measures: Keep clean, warm, and dry. Use medication by any route, positioning, wound care, and other measures to relieve pain and suffering. Use oxygen, suction and manual treatment of airway obstruction as needed for comfort. Do not transfer to the hospital unless comfort needs cannot be met in current location. Comfort measures initiated if patient declines.  Antibiotics: Determine use of limitation of antibiotics when infection occurs  IV Fluids: IV fluids if indicated  Feeding Tube: No feeding tube    Discussed the importance of continued conversation with family and the medical providers regarding overall plan of care and treatment options, ensuring decisions are within the context of the patient's values and GOCs.  Questions and concerns were addressed. The family was encouraged to call with questions or concerns. PMT  will continue to support holistically.   Recommendations/Plan: Changed to DNR/DNI Continue to treat the treatable if/when patient declines she would like to transition to comfort measures MOST form completed Spiritual care consult- HCPOA document: Patient wants son Larnell to be HCPOA with granddaughter Clayborne as secondary Continue with [facility] at discharge Discharge to [place] PMT will support peripherally    Code Status:    Code Status Orders  (From admission, onward)           Start     Ordered   09/10/23 0115  Full code  Continuous       Question:  By:  Answer:  Consent: discussion documented in EHR   09/10/23 0115         Extensive chart review has been completed prior to seeing the patient including labs, vital signs, imaging, progress/consult notes, orders, medications, and available advance directive documents.  Care plan was discussed with bedside RN and Dr. Judeth  Time spent: 65 minutes  Thank you for allowing the Palliative Medicine Team to assist in the care of this patient.   Stephane CHRISTELLA Palin, NP  Please contact Palliative Medicine Team phone at [phone] for questions and concerns.

## 2023-09-22 NOTE — Progress Notes (Signed)
 Mobility Specialist Progress Note;   09/22/23 1203  Mobility  Activity Dangled on edge of bed  Level of Assistance Minimal assist, patient does 75% or more  Assistive Device None  Activity Response Tolerated well  Mobility Referral Yes  Mobility visit 1 Mobility  Mobility Specialist Start Time (ACUTE ONLY) 1203  Mobility Specialist Stop Time (ACUTE ONLY) 1211  Mobility Specialist Time Calculation (min) (ACUTE ONLY) 8 min   Pt agreeable to mobility. Deferred OOB mobility however, agreeable to sit on EoB to eat lunch tray that just arrived. Required MinA for bed mobility to assist w/ upper extremity. Cough and breathing seemed improved once sitting up, compared to on entry. Pt left sitting on Eob with all needs met, call bell in reach. Granddaughter present.   Lauraine Erm Mobility Specialist Please contact via SecureChat or [employer] [phone]

## 2023-09-23 DIAGNOSIS — J9601 Acute respiratory failure with hypoxia: Secondary | ICD-10-CM | POA: Diagnosis not present

## 2023-09-23 DIAGNOSIS — J441 Chronic obstructive pulmonary disease with (acute) exacerbation: Secondary | ICD-10-CM | POA: Diagnosis not present

## 2023-09-23 DIAGNOSIS — I214 Non-ST elevation (NSTEMI) myocardial infarction: Secondary | ICD-10-CM | POA: Diagnosis not present

## 2023-09-23 LAB — GLUCOSE, CAPILLARY
Glucose-Capillary: 103 mg/dL — ABNORMAL HIGH (ref 70–99)
Glucose-Capillary: 105 mg/dL — ABNORMAL HIGH (ref 70–99)
Glucose-Capillary: 156 mg/dL — ABNORMAL HIGH (ref 70–99)
Glucose-Capillary: 192 mg/dL — ABNORMAL HIGH (ref 70–99)

## 2023-09-23 MED ORDER — INSULIN GLARGINE-YFGN 100 UNIT/ML ~~LOC~~ SOLN
7.0000 [IU] | Freq: Every day | SUBCUTANEOUS | Status: DC
  Administered 2023-09-23: 7 [IU] via SUBCUTANEOUS
  Filled 2023-09-23: qty 0.07

## 2023-09-23 MED ORDER — INSULIN GLARGINE-YFGN 100 UNIT/ML ~~LOC~~ SOLN: 7.0000 [IU] | Freq: Every day | SUBCUTANEOUS | Status: DC

## 2023-09-23 MED ORDER — INSULIN ASPART 100 UNIT/ML IJ SOLN: 0.0000 [IU] | Freq: Every day | INTRAMUSCULAR | Status: DC

## 2023-09-23 MED ORDER — HYDROXYZINE HCL 10 MG PO TABS: 10.0000 mg | ORAL_TABLET | Freq: Three times a day (TID) | ORAL | Status: DC | PRN

## 2023-09-23 MED ORDER — ACETAMINOPHEN 325 MG PO TABS: 650.0000 mg | ORAL_TABLET | ORAL | Status: DC | PRN

## 2023-09-23 MED ORDER — INSULIN ASPART 100 UNIT/ML IJ SOLN
0.0000 [IU] | Freq: Three times a day (TID) | INTRAMUSCULAR | Status: DC
  Administered 2023-09-23 (×2): 2 [IU] via SUBCUTANEOUS

## 2023-09-23 MED ORDER — INSULIN ASPART 100 UNIT/ML FLEXPEN: 0.0000 [IU] | PEN_INJECTOR | Freq: Three times a day (TID) | SUBCUTANEOUS | Status: DC

## 2023-09-23 MED ORDER — METOPROLOL SUCCINATE ER 25 MG PO TB24: 25.0000 mg | ORAL_TABLET | Freq: Every day | ORAL | Status: DC

## 2023-09-23 MED ORDER — GUAIFENESIN-DM 100-10 MG/5ML PO SYRP: 10.0000 mL | ORAL_SOLUTION | ORAL | Status: DC | PRN

## 2023-09-23 NOTE — TOC Progression Note (Addendum)
 Transition of Care [facility]) - Progression Note    Patient Details  Name: Anna [name] MRN: [id number] Date of Birth: 14-Nov-1955  [name] CM/SW Contact  Isaiah Public, LCSWA Phone Number: 09/23/2023, 10:50 AM  Clinical Narrative:     MD confirmed patient will need cpap at facility.CSW spoke with Leontine with [facility] and informed her that patient will need cpap at facility. [name] confirmed she has settings for cpap.[name] informed CSW that facility will need to order cpap, will get back with CSW if patient able to dc over today.Patient informed CSW that she does not have cpap at home and will need cpap at facility. Alan with [facility] informed CSW that they will provide transportation when patient medically stable for dc.CSW will continue to follow.  Update- CSW received call from Rosston with [facility] who informed CSW that cpap will arrive today and that patient can dc over today if medically stable. CSW informed MD. CSW updated amanda with [facility]. Alan informed CSW to give her a call when patient ready for dc to set up transportation with [facility] to transport to facility.     Expected Discharge Plan: Skilled Nursing Facility Barriers to Discharge: Continued Medical Work up, SNF Pending bed offer  Expected Discharge Plan and Services In-house Referral: Clinical Social Work Discharge Planning Services: CM Consult Post Acute Care Choice: Skilled Nursing Facility Living arrangements for the past 2 months: Apartment                 DME Arranged: N/A DME Agency: NA       HH Arranged:  (see note)           Social Determinants of Health (SDOH) Interventions SDOH Screenings   Food Insecurity: No Food Insecurity (09/10/2023)  Housing: High Risk (09/10/2023)  Transportation Needs: No Transportation Needs (09/10/2023)  Utilities: At Risk (09/10/2023)  Social Connections: Socially Isolated (09/10/2023)  Tobacco Use: Low Risk  (09/10/2023)    Readmission Risk  Interventions    09/11/2023   11:53 AM  Readmission Risk Prevention Plan  Transportation Screening Complete  Medication Review (RN Care Manager) Referral to Pharmacy  PCP or Specialist appointment within 3-5 days of discharge Complete  HRI or Home Care Consult Complete

## 2023-09-23 NOTE — Progress Notes (Deleted)
 Hold metoprolol  for low bp 97/52 (67).  Amado GORMAN Arabia, RN

## 2023-09-23 NOTE — TOC Transition Note (Signed)
 Transition of Care [facility]) - Discharge Note   Patient Details  Name: Anna [name] MRN: [id number] Date of Birth: 1955-04-04  [facility] CM/SW Contact:  Isaiah Public, LCSWA Phone Number: 09/23/2023, 2:31 PM   Clinical Narrative:     Patient will DC to: [facility]  Anticipated DC date: 09/23/2023  Family notified: Clayborne  Transport by: [facility] transport  ?  Per MD patient ready for DC to [facility] . RN, patient, patient's family, and facility notified of DC. [name] with [facility] confirmed cpap at facility.Discharge Summary sent to facility. RN given number for report [phone] RM# [phone]. DC packet on chart. [facility] transport requested for patient. [facility] caseworker informed CSW that [facility] transport will pick up patient between 4:30pm and 4:45pm.   CSW signing off.   Final next level of care: Skilled Nursing Facility Barriers to Discharge: No Barriers Identified   Patient Goals and CMS Choice Patient states their goals for this hospitalization and ongoing recovery are:: SNF   Choice offered to / list presented to : Patient      Discharge Placement              Patient chooses bed at: [facility] Patient to be transferred to facility by: [facility] Name of family member notified: Clayborne Patient and family notified of of transfer: 09/23/23  Discharge Plan and Services Additional resources added to the After Visit Summary for   In-house Referral: Clinical Social Work Discharge Planning Services: CM Consult Post Acute Care Choice: Skilled Nursing Facility          DME Arranged: N/A DME Agency: NA       HH Arranged:  (see note)          Social Drivers of Health (SDOH) Interventions SDOH Screenings   Food Insecurity: No Food Insecurity (09/10/2023)  Housing: High Risk (09/10/2023)  Transportation Needs: No Transportation Needs (09/10/2023)  Utilities: At Risk (09/10/2023)  Social Connections: Socially Isolated (09/10/2023)  Tobacco Use: Low  Risk  (09/10/2023)     Readmission Risk Interventions    09/11/2023   11:53 AM  Readmission Risk Prevention Plan  Transportation Screening Complete  Medication Review (RN Care Manager) Referral to Pharmacy  PCP or Specialist appointment within 3-5 days of discharge Complete  HRI or Home Care Consult Complete

## 2023-09-23 NOTE — Progress Notes (Signed)
 PT Cancellation Note  Patient Details Name: Anna [name] MRN: [id number] DOB: [birth year]   Cancelled Treatment:    Reason Eval/Treat Not Completed: Other (comment) (Refused therapy. Chaplain in room and pt didnt want to get back to bed at present.)   [name] Bevel 09/23/2023, 3:05 PM [name] M,PT Acute Rehab Services [phone]

## 2023-09-23 NOTE — Progress Notes (Signed)
 Called report to Dorina, Facilities manager at [employer].   Amado GORMAN Arabia, RN

## 2023-09-23 NOTE — Progress Notes (Signed)
 Heart Failure Navigator Progress Note  Assessed for Heart & Vascular TOC clinic readiness.  Patient does not meet criteria due to - more palliative approach. Will transition to hospice if/when she declines, EF 40-45%, No HF TOC.    Navigator will sign off at this time.   Stephane Haddock, BSN, Scientist, clinical (histocompatibility and immunogenetics) Only

## 2023-09-23 NOTE — Progress Notes (Addendum)
 This chaplain responded to PMT NP-Dawn consult for creating the Pt. HCPOA and providing support. The Pt. is awake and reflecting on last night's feelings of loneliness after family left the bedside. The chaplain observed the Pt. increased emotion contributing to SOB. The Pt. accepts guided breathing to regain comfort.  The Pt. Granddaughter-Dezzi is on the phone and son-Michael arrives at the bedside during the visit. The spiritual care visit was paused so the Pt. can eat the breakfast Ozell delivered. The chaplain will attempt a revisit this morning.  **1020 Pt. not available, on the phone. **1048 Pt. not available, anticipating patient care.  **1303 Chaplain F/U with the Pt. and granddaughter-Mollie. The Pt. has an understanding of the role of HCPOA through education and answering of clarifying questions. The chaplain understands the obstacle to completing HCPOA at this time is determining the person to serve in the role of healthcare agent.  Chaplain Leeroy Hummer [phone]

## 2023-09-23 NOTE — Discharge Summary (Signed)
 Physician Discharge Summary  Anna [name] FMW:[id number] DOB: 10/03/1955  PCP: Cloria Annabella CROME, DO  Admitted from: Home Discharged to: SNF  Admit date: 09/09/2023 Discharge date: 09/23/2023  Recommendations for Outpatient Follow-up:    Contact information for follow-up providers     [facility] at [facility]. Schedule an appointment as soon as possible for a visit in 2 week(s).   Specialty: Pulmonology Why: for sleep apnea and to obtain a new CPAP Contact information: [address] [place]  [address] [phone] Additional information: [address]  [address]  [place], [place] [address]        [facility] Follow up.   Why: Office will call to arrange visit wtih Dr. Devere to arrange next stent exchange. Contact information: [address] [place]  [address] [phone]        Rosemarie Eather RAMAN, MD. Go on 12/23/2023.   Specialties: Neurology, Radiology Contact information: [address] [place] [address] [phone]         Michele Richardson, DO. Schedule an appointment as soon as possible for a visit.   Specialties: Cardiology, Vascular Surgery Contact information: [address] [place] [address] [phone]         Cloria Annabella CROME, DO. Schedule an appointment as soon as possible for a visit.   Specialty: Geriatric Medicine Why: Upon discharge from SNF. Contact information: [address] [place] [address] [phone]         MD at SNF. Schedule an appointment as soon as possible for a visit.   Why: To be seen in 2 to 3 days with repeat labs (CBC & BMP).  Recommend palliative care consultation and follow-up at SNF.             Contact information for after-discharge care     Destination     [facility] .   Service: Skilled Nursing Contact information: [address] [place]   [phone]                      68 year old F with PMH    Equipment/Devices: TBD at [facility]    Discharge Condition: Improved and stable but overall prognosis guarded.   Code Status: Limited: Do not attempt resuscitation (DNR) -DNR-LIMITED -Do Not Intubate/DNI  Diet recommendation:  Discharge Diet Orders (From admission, onward)     Start     Ordered   09/23/23 0000  Diet - low sodium heart healthy        09/23/23 1402   09/23/23 0000  Diet Carb Modified        09/23/23 1402             Discharge Diagnoses:  Principal Problem:   COPD with acute exacerbation (HCC) Active Problems:   History of stroke   Essential hypertension   OSA (obstructive sleep apnea)   Type 2 diabetes mellitus with diabetic peripheral angiopathy and gangrene, with long-term current use of insulin  (HCC)   CKD stage 3a, GFR 45-59 ml/min (HCC)   COPD exacerbation (HCC)   Non-ST elevation (NSTEMI) myocardial infarction (HCC)   Cardiomyopathy (HCC)   HFrEF (heart failure with reduced ejection fraction) (HCC)   Status post below-knee amputation of left lower extremity (HCC)   Acute HFrEF (heart failure with reduced ejection fraction) (HCC)   Atherosclerotic heart disease   Brief Hospital Course:   68 year old F with PMH of COPD, CVA, IDDM-2,  left BKA, HTN, anxiety, depression and reported COVID-19 infection about 2 and half weeks prior for which she was treated with Paxlovid  presents with shortness of breath and cough, and admitted with COPD exacerbation.   In ED, she was slightly tachycardic and tachypneic. Cr 1.19.  WBC 11.4.  Hgb 9.8.  Platelet 484.  UA with large LE.  CXR without acute finding.  A 20 pathogen RVP nonreactive.  Patient was started on Solu-Medrol , ceftriaxone  and nebulizers and admitted. COVID-19 swab nonreactive.   CT angio chest negative for PE but scattered basilar predominant interstitial and groundglass opacities compatible with given history of recent COVID-19  pneumonia, borderline enlarged mediastinal adenopathy likely reactive and nonspecific mild periportal edema.  TTE with LVEF of 40 to 45%, RWMA, G1 DD and RVSP of 30 mmHg.  BNP elevated to 1300.  Troponin elevated to 385.  Cardiology consulted.   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  CTS consulted.  Not felt candidate for CABG.  Cardiology unsuccessfully attempted atherectomy of LAD on 6/25, procedure had to be aborted due to complications from periprocedural polypharmacy.  She has since recovered but does not wish to proceed with any aggressive interventions.  Palliative care had family meeting on 6/29.     Assessment & Plan:    NSTEMI Severe multivessel CAD Presented with chest pain.  Troponin up to 385 TTE showed LVEF of 40-45% with wall motion abnormalities LHC showed severe multivessel disease Cardiothoracic surgery consulted and they did not feel that she was a CABG candidate. Treated with IV heparin , aspirin , Toprol , Lipitor . Cardiac cath with atherectomy of LAD was attempted on 6/25 but procedure had to be aborted due to complications: AMS, stopped breathing briefly, hypotension with BP in the 60s, asymmetric pupils, likely related to multiple medications during procedure, appears that code stroke was activated, CT head without acute findings, neurology consulted, appears to have returned to her baseline. Patient does not wish to proceed with any aggressive interventions.  Cardiology recommended palliative care input-they were consulted and with family on 6/29-discussion below. Per cardiology, continue medical management of NSTEMI with aspirin  81 Mg daily, atorvastatin  80 Mg daily, Toprol -XL 25 Mg daily.  Spironolactone  and losartan  were discontinued due to soft blood pressures.  Cardiology signed off 6/26.   Hyperlipidemia Continue Lipitor    HFmrEF Echo results as above BNP 1360 Underwent diuresis with IV Bumex .  Clinically euvolemic. Continue GDMT as above.   PAD s/p left  BKA Continue aspirin  and statin.  Outpatient follow-up with vascular surgery   Asthma exacerbation/tracheomalacia/GERD/postviral from recent COVID-19 infection Workup as noted in Dr. Dela note from 6/24 Completed IV steroids In the hospital treated with Brovana , Pulmicort , Yupelri , DuoNebs, PPI.  At discharge, continue prior home meds Stable.   Type II DM/IDDM with hyperglycemia Due to hypoglycemic episodes, reduced Semglee  to 7 units daily.  Continue rest of meds as below.  A1c 7.5 on 06/02/2023.   Acute kidney injury complicating stage IIIa CKD Baseline creatinine 1.1 Suspected due to hypotensive episode and LHC Creatinine had peaked to 1.47 now down to 1.13   Anemia Stable.   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  They feel that the stent is in good position and functioning well and they do not favor OR stent exchange this hospitalization given her acute cardiovascular and pulmonary issues. Outpatient follow-up in 6 weeks with Dr. Devere.   Adult failure to thrive/goals of care Closely following palliative care input. Discussed with  patient in detail.  Advised her that this is a complex situation but at the current time, there is no ongoing hospital level of care.  Encouraged her to discuss with her family and advance decisions regarding goals of care.  She is medically ready for discharge, likely to SNF on Monday.  She verbalized understanding. Palliative care team met with patient and family on 6/29.  She opted for DNR/DNI, would like to continue to treat the treatable and if her condition worsens, she would want avoid rehospitalization and at that time would want to transition to comfort measures.   Body mass index is 32.15 kg/m./Obesity Complicates care.     Consultants:   Cardiology Neurology Urology Palliative care medicine   Procedures:   As above   Discharge  Instructions  Discharge Instructions     (HEART FAILURE PATIENTS) Call MD:  Anytime you have any of the following symptoms: 1) 3 pound weight gain in 24 hours or 5 pounds in 1 week 2) shortness of breath, with or without a dry hacking cough 3) swelling in the hands, feet or stomach 4) if you have to sleep on extra pillows at night in order to breathe.   Complete by: As directed    Amb Referral to Cardiac Rehabilitation   Complete by: As directed    Order placed per protocol referral for palliative care discharge to SNF   Diagnosis: NSTEMI   After initial evaluation and assessments completed: Virtual Based Care may be provided alone or in conjunction with Phase 2 Cardiac Rehab based on patient barriers.: Yes   Intensive Cardiac Rehabilitation (ICR) [facility] location only OR Traditional Cardiac Rehabilitation (TCR) *If criteria for ICR are not met will enroll in TCR [facility] only): Yes   Call MD for:  difficulty breathing, headache or visual disturbances   Complete by: As directed    Call MD for:  extreme fatigue   Complete by: As directed    Call MD for:  persistant dizziness or light-headedness   Complete by: As directed    Call MD for:  persistant nausea and vomiting   Complete by: As directed    Call MD for:  severe uncontrolled pain   Complete by: As directed    Call MD for:  temperature >100.4   Complete by: As directed    Diet - low sodium heart healthy   Complete by: As directed    Diet Carb Modified   Complete by: As directed    Discharge instructions   Complete by: As directed    CPAP at bedtime.   Increase activity slowly   Complete by: As directed         Medication List     STOP taking these medications    acidophilus Caps capsule   BIOFREEZE EX   [facility] Caps   lidocaine  4 %   lisinopril  5 MG tablet Commonly known as: ZESTRIL    LORazepam  0.5 MG tablet Commonly known as: ATIVAN    nystatin  cream Commonly known as: MYCOSTATIN    Toujeo   SoloStar 300 UNIT/ML Solostar Pen Generic drug: insulin  glargine (1 Unit Dial )       TAKE these medications    acetaminophen  325 MG tablet Commonly known as: TYLENOL  Take 2 tablets (650 mg total) by mouth every 4 (four) hours as needed for headache, mild pain (pain score 1-3) or moderate pain (pain score 4-6). What changed:  medication strength how much to take when to take this reasons to take this  albuterol  108 (90 Base) MCG/ACT inhaler Commonly known as: VENTOLIN  HFA Inhale 1-2 puffs into the lungs every 6 (six) hours as needed for wheezing or shortness of breath.   aspirin  EC 81 MG tablet Take 81 mg by mouth daily. Swallow whole.   atorvastatin  80 MG tablet Commonly known as: LIPITOR  Take 1 tablet (80 mg total) by mouth at bedtime.   busPIRone  7.5 MG tablet Commonly known as: BUSPAR  Take 7.5 mg by mouth 2 (two) times daily.   Dulaglutide 3 MG/0.5ML Soaj Inject 3 mg into the skin every Saturday.   estradiol 0.1 MG/GM vaginal cream Commonly known as: ESTRACE Place 1 Applicatorful vaginally as needed (dryness / irritation).   ferrous sulfate  325 (65 FE) MG tablet Take 325 mg by mouth daily with breakfast.   FLUoxetine  40 MG capsule Commonly known as: PROZAC  Take 40 mg by mouth daily.   fluticasone 50 MCG/ACT nasal spray Commonly known as: FLONASE Place 1 spray into both nostrils daily as needed for allergies or rhinitis.   gabapentin  400 MG capsule Commonly known as: NEURONTIN  Take 400 mg by mouth 3 (three) times daily.   guaiFENesin -dextromethorphan  100-10 MG/5ML syrup Commonly known as: ROBITUSSIN DM Take 10 mLs by mouth every 4 (four) hours as needed for cough.   hydrOXYzine  10 MG tablet Commonly known as: ATARAX  Take 1 tablet (10 mg total) by mouth every 8 (eight) hours as needed for anxiety.   insulin  aspart 100 UNIT/ML FlexPen Commonly known as: NOVOLOG  Inject 0-9 Units into the skin 3 (three) times daily with meals. CBG < 70: Implement  Hypoglycemia protocol, CBG 70 - 120: 0 units CBG 121 - 150: 1 unit CBG 151 - 200: 2 units CBG 201 - 250: 3 units CBG 251 - 300: 5 units CBG 301 - 350: 7 units CBG 351 - 400: 9 units CBG > 400: call MD. What changed:  how much to take how to take this when to take this additional instructions   insulin  glargine-yfgn 100 UNIT/ML injection Commonly known as: SEMGLEE  Inject 0.07 mLs (7 Units total) into the skin daily. Start taking on: September 24, 2023   ipratropium-albuterol  0.5-2.5 (3) MG/3ML Soln Commonly known as: DUONEB Use 3 ml by nebulization twice a day scheduled and every 4 hours as needed for shortness of breath and wheezing What changed:  how much to take how to take this when to take this reasons to take this   loratadine  10 MG tablet Commonly known as: CLARITIN  Take 10 mg by mouth daily as needed for allergies.   meclizine 25 MG tablet Commonly known as: ANTIVERT Take 25 mg by mouth daily as needed for dizziness.   metFORMIN  1000 MG tablet Commonly known as: GLUCOPHAGE  Take 1,000 mg by mouth 2 (two) times daily with a meal.   metoprolol  succinate 25 MG 24 hr tablet Commonly known as: TOPROL -XL Take 1 tablet (25 mg total) by mouth daily. Start taking on: September 24, 2023   nystatin  100000 UNIT/ML suspension Commonly known as: MYCOSTATIN  Take 1 mL by mouth 4 (four) times daily as needed (red gums).   ondansetron  4 MG disintegrating tablet Commonly known as: ZOFRAN -ODT Take 1 tablet (4 mg total) by mouth every 8 (eight) hours as needed for nausea or vomiting.   pantoprazole  40 MG tablet Commonly known as: PROTONIX  Take 40 mg by mouth 2 (two) times daily.   rOPINIRole  0.25 MG tablet Commonly known as: REQUIP  Take 0.25 mg by mouth at bedtime.   sennosides-docusate sodium  8.6-50 MG tablet  Commonly known as: SENOKOT-S Take 1 tablet by mouth 2 (two) times daily as needed for constipation.   tamsulosin  0.4 MG Caps capsule Commonly known as: FLOMAX  Take 1 capsule  (0.4 mg total) by mouth daily.   topiramate  50 MG tablet Commonly known as: TOPAMAX  Take 1 tablet (50 mg total) by mouth 2 (two) times daily.       Allergies  Allergen Reactions   Lasix [Furosemide] Shortness Of Breath and Rash   Bacitracin Other (See Comments)    Burns skin     Ciprofloxacin  Swelling   Citalopram Diarrhea   Doxycycline  Swelling and Other (See Comments)    burning all over body     Latex Itching   Morphine And Codeine  Hives   Duloxetine Other (See Comments)    Mental Status Changes (intolerance) saw pictures of gun   Levaquin [Levofloxacin In D5w] Other (See Comments)    Pt does not remember reaction but states she's had issues with med   Salvia Officinalis Other (See Comments)    Sage- sneezing    Soma [Carisoprodol] Other (See Comments)    Sleepy and constipation        Procedures/Studies: CARDIAC CATHETERIZATION Result Date: 09/18/2023 Aborted procedure, see above for details. Sheath sutured in place. Newman JINNY Lawrence, MD  CT HEAD CODE STROKE WO CONTRAST Result Date: 09/18/2023 CLINICAL DATA:  Code stroke. Neuro deficit, concern for stroke, uneven pupils. EXAM: CT HEAD WITHOUT CONTRAST TECHNIQUE: Contiguous axial images were obtained from the base of the skull through the vertex without intravenous contrast. RADIATION DOSE REDUCTION: This exam was performed according to the departmental dose-optimization program which includes automated exposure control, adjustment of the mA and/or kV according to patient size and/or use of iterative reconstruction technique. COMPARISON:  MRI head 06/02/2023.  CT head 09/12/2023. FINDINGS: Brain: No acute intracranial hemorrhage. No CT evidence of acute infarct. No edema, mass effect, or midline shift. The basilar cisterns are patent. Nonspecific hypoattenuation in the periventricular and subcortical white matter favored to reflect chronic microvascular ischemic changes. Remote lacunar infarcts in the bilateral  thalami. Ventricles: The ventricles are normal. Vascular: Atherosclerotic calcifications of the carotid siphons and intracranial vertebral arteries. No hyperdense vessel. Skull: No acute or aggressive finding. Orbits: Orbits are symmetric. Sinuses: Mucosal thickening in the bilateral sphenoid sinuses. Other: Mastoid air cells are clear. ASPECTS [facility] Stroke Program Early CT Score) - Ganglionic level infarction (caudate, lentiform nuclei, internal capsule, insula, M1-M3 cortex): 7 - Supraganglionic infarction (M4-M6 cortex): 3 Total score (0-10 with 10 being normal): 10 IMPRESSION: 1. No CT evidence of acute intracranial abnormality. 2. Chronic microvascular ischemic changes. 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 3. Mucosal thickening and possible air-fluid levels in the sphenoid sinuses. Recommend correlation for acute sinusitis. 4. ASPECTS is 10 These results were communicated to Dr. Jerri at 12:50 pm on 09/18/2023 by text page via the [facility] messaging system. Electronically Signed   By: Donnice Mania M.D.   On: 09/18/2023 12:50   VAS US  LOWER EXTREMITY SAPHENOUS VEIN MAPPING Result Date: 09/16/2023 LOWER EXTREMITY VEIN MAPPING Patient Name:  REHANNA OLOUGHLIN  Date of Exam:   09/16/2023 Medical Rec #: [id number]         Accession #:    [id number] Date of Birth: 02/09/1956        Patient Gender: F Patient Age:   22 years Exam Location:  [facility] Procedure:      VAS US  LOWER EXTREMITY SAPHENOUS VEIN MAPPING Referring Phys: DONIELLE ZIMMERMAN --------------------------------------------------------------------------------  Indications:  Pre-op Risk Factors: Hypertension, hyperlipidemia, Diabetes.  Performing Technologist: Elmarie Lindau, RVT  Examination Guidelines: A complete evaluation includes B-mode imaging, spectral Doppler, color Doppler, and power Doppler as needed of all accessible portions of each vessel. Bilateral testing is considered an integral part of a complete examination. Limited  examinations for reoccurring indications may be performed as noted. +----------------+-----------+--------------+----------------+-----------+ RT Diameter (cm)RT Findings     GSV      LT Diameter (cm)LT Findings +----------------+-----------+--------------+----------------+-----------+       0.58                 Proximal thigh      0.43                  +----------------+-----------+--------------+----------------+-----------+       0.17       branching   Mid thigh                               +----------------+-----------+--------------+----------------+-----------+       0.15                  Distal thigh                             +----------------+-----------+--------------+----------------+-----------+       0.14                      Knee                                 +----------------+-----------+--------------+----------------+-----------+       0.16                   Prox calf                               +----------------+-----------+--------------+----------------+-----------+ Right Tech Comments: Patent right GSV with normal compression and no evidence of thrombus. The GSV is not seen distal to the prox calf area. Left Tech Comments: Lt BKA Summary:  Right: Patent right great saphenous vein with no evidence of        thrombus.        The right great saphenous vein measures 0.14 to 0.58 cm.  *See table(s) above for measurements and observations. Diagnosing physician: Lonni Gaskins MD Electronically signed by Lonni Gaskins MD on 09/16/2023 at 2:47:09 PM.    Final    VAS US  CAROTID Result Date: 09/16/2023 Carotid Arterial Duplex Study Patient Name:  ZAELA GRALEY  Date of Exam:   09/16/2023 Medical Rec #: [id number]         Accession #:    [id number] Date of Birth: 04-18-1955        Patient Gender: F Patient Age:   24 years Exam [facility] Procedure:      VAS US  CAROTID Referring Phys: KYLA DONALD  --------------------------------------------------------------------------------  Indications:   Bilateral bruits. Risk Factors:  Hypertension, hyperlipidemia, Diabetes, prior CVA. Other Factors: Hx of bilat 1-39% ICA stenosis. Performing Technologist: Elmarie Lindau, RVT  Examination Guidelines: A complete evaluation includes B-mode imaging, spectral Doppler, color Doppler, and power Doppler as needed of all accessible portions of each vessel. Bilateral testing is considered an integral part of a complete examination. Limited examinations for  reoccurring indications may be performed as noted.  Right Carotid Findings: +----------+--------+--------+--------+------------------+------------------+           PSV cm/sEDV cm/sStenosisPlaque DescriptionComments           +----------+--------+--------+--------+------------------+------------------+ CCA Prox  59      12                                                   +----------+--------+--------+--------+------------------+------------------+ CCA Distal41      10              heterogenous                         +----------+--------+--------+--------+------------------+------------------+ ICA Prox  82      27                                intimal thickening +----------+--------+--------+--------+------------------+------------------+ ICA Distal77      22                                                   +----------+--------+--------+--------+------------------+------------------+ ECA       50                                                           +----------+--------+--------+--------+------------------+------------------+ +----------+--------+-------+----------------+-------------------+           PSV cm/sEDV cmsDescribe        Arm Pressure (mmHG) +----------+--------+-------+----------------+-------------------+ [id number]            Multiphasic, WNL                     +----------+--------+-------+----------------+-------------------+ +---------+--------+--+--------+--+---------+ VertebralPSV cm/s38EDV cm/s12Antegrade +---------+--------+--+--------+--+---------+  Left Carotid Findings: +----------+--------+--------+--------+------------------+--------+           PSV cm/sEDV cm/sStenosisPlaque DescriptionComments +----------+--------+--------+--------+------------------+--------+ CCA Prox  72      20                                         +----------+--------+--------+--------+------------------+--------+ CCA Distal72      19              heterogenous               +----------+--------+--------+--------+------------------+--------+ ICA Prox  66      17              heterogenous               +----------+--------+--------+--------+------------------+--------+ ICA Distal103     31                                         +----------+--------+--------+--------+------------------+--------+ ECA       124                                                +----------+--------+--------+--------+------------------+--------+ +----------+--------+--------+----------------+-------------------+  PSV cm/sEDV cm/sDescribe        Arm Pressure (mmHG) +----------+--------+--------+----------------+-------------------+ [id number]              Multiphasic, WNL                    +----------+--------+--------+----------------+-------------------+ +---------+--------+--+--------+-+---------+ VertebralPSV cm/s34EDV cm/s8Antegrade +---------+--------+--+--------+-+---------+   Summary: Right Carotid: Velocities in the right ICA are consistent with a 1-39% stenosis. Left Carotid: Velocities in the left ICA are consistent with a 1-39% stenosis. Vertebrals:  Bilateral vertebral arteries demonstrate antegrade flow. Subclavians: Normal flow hemodynamics were seen in bilateral subclavian              arteries. *See table(s) above for  measurements and observations.  Electronically signed by Lonni Gaskins MD on 09/16/2023 at 2:46:52 PM.    Final    CARDIAC CATHETERIZATION Result Date: 09/13/2023 Images from the original result were not included. Coronary angiography 09/13/2023: LM: No significant disease LAD: Prox diffuse 70% disease with severe calcification (Severity could be underestimated given the diffuse nature of the disease)          Mid 90% stenosis          Diag 2 with prox diffuse 80% disease Lcx: No significant disease RCA: Prox 100% occlusion with severe prox-mid calcification          Left-to-tight collaterals from LAD to RPDA          RPDA diffuse disease at least 60% LVEDP 12 mmHg Severe multivessel disease in a diabetic patient. Presentation more likely chronic coronary syndrome than true ACS. Anatomically, she has good targets for surgical revascularization in at least LAD and diag, RPDA target is questionable. Recommend surgical consult for at least a discussion regarding CABG.  If she is not deemed to be a surgical candidate at this time, could consider medical management for CAD and GDMT for HFrEF, and consider CABG in the near future.  However, if she is deemed not a surgical candidate due to all her other comorbidities, including left BKA, COPD, we could consider PCI options.  While her focal stenosis is in mid LAD, her diffuse disease in proximal and mid LAD is likely physiologically significant.  She would require atherectomy for optimal percutaneous revascularization to this LAD, also bearing in mind the possibility of requiring 2 stenting strategy given severe diffuse disease in a large diagonal 2.  Overall, percutaneous revascularization options are complex and high risk.  Other option could be medical management alone given her presentation of rather chronic coronary syndrome rather than ACS. Newman JINNY Lawrence, MD   CT HEAD WO CONTRAST ( ) Result Date: 09/12/2023 EXAM: CT HEAD WITHOUT CONTRAST 09/12/2023  10:32:00 PM TECHNIQUE: CT of the head was performed without the administration of intravenous contrast. Automated exposure control, iterative reconstruction, and/or weight based adjustment of the mA/kV was utilized to reduce the radiation dose to as low as reasonably achievable. COMPARISON: MRI brain dated 06/02/2023. CLINICAL HISTORY: Head trauma, minor, normal mental status (Age 48-64y); hospital fall. FINDINGS: BRAIN AND VENTRICLES: There is no acute intracranial hemorrhage, mass effect or midline shift. No abnormal extra-axial fluid collection. The gray-white differentiation is maintained without an acute infarct. There is no hydrocephalus. Global cortical atrophy. Subcortical and periventricular small vessel ischemic changes. Intracranial atherosclerosis. ORBITS: The visualized portion of the orbits demonstrate no acute abnormality. SINUSES: The visualized paranasal sinuses and mastoid air cells demonstrate no acute abnormality. SOFT TISSUES AND SKULL: No acute abnormality of the visualized skull or soft tissues. IMPRESSION: 1. No  acute intracranial abnormality. 2. Atrophy with small vessel ischemic changes. Electronically signed by: Pinkie Pebbles MD 09/12/2023 10:35 PM EDT RP Workstation: [id number]   ECHOCARDIOGRAM COMPLETE Result Date: 09/11/2023    ECHOCARDIOGRAM REPORT   Patient Name:   ALLYAH HEATHER Boening Date of Exam: 09/11/2023 Medical Rec #:  [id number]        Height:       62.0 in Accession #:    [id number]       Weight:       160.0 lb Date of Birth:  1956/02/22       BSA:          1.739 m Patient Age:    67 years         BP:           132/79 mmHg Patient Gender: F                HR:           99 bpm. Exam Location:  Inpatient Procedure: 2D Echo, Intracardiac Opacification Agent, Cardiac Doppler and Color            Doppler (Both Spectral and Color Flow Doppler were utilized during            procedure). Indications:    dyspnea  History:        Patient has prior history of Echocardiogram  examinations, most                 recent 06/03/2023.  Sonographer:    Therisa Crouch Referring Phys: [phone] [facility]  Sonographer Comments: Technically difficult study due to poor echo windows. IMPRESSIONS  1. No apical thrombus with Definity  contrast. Left ventricular ejection fraction, by estimation, is 40 to 45%. The left ventricle has mildly decreased function. The left ventricle demonstrates regional wall motion abnormalities (see scoring diagram/findings for description). Left ventricular diastolic parameters are consistent with Grade I diastolic dysfunction (impaired relaxation). There is severe hypokinesis of the left ventricular, entire inferior wall, anterior segment, inferolateral wall and lateral wall. The apical lateral wall is hyperdynamic.  2. Right ventricular systolic function is moderately reduced. The right ventricular size is normal. There is normal pulmonary artery systolic pressure. The estimated right ventricular systolic pressure is 30.2 mmHg.  3. The mitral valve is grossly normal. Trivial mitral valve regurgitation.  4. The aortic valve was not well visualized. Aortic valve regurgitation is not visualized. No aortic stenosis is present.  5. The inferior vena cava is dilated in size with <50% respiratory variability, suggesting right atrial pressure of 15 mmHg. Comparison(s): Changes from prior study are noted. 06/03/2023: LVEF 60-65%. Conclusion(s)/Recommendation(s): Findings suggest probable LCX territory ischemia/infarct. FINDINGS  Left Ventricle: No apical thrombus with Definity  contrast. Left ventricular ejection fraction, by estimation, is 40 to 45%. The left ventricle has mildly decreased function. The left ventricle demonstrates regional wall motion abnormalities. Severe hypokinesis of the left ventricular, entire inferior wall, anterior segment, inferolateral wall and lateral wall. The left ventricular internal cavity size was normal in size. There is no left ventricular  hypertrophy. Left ventricular diastolic parameters are consistent with Grade I diastolic dysfunction (impaired relaxation). Indeterminate filling pressures.  LV Wall Scoring: The entire lateral wall is hypokinetic. The apical lateral wall is hyperdynamic. Right Ventricle: The right ventricular size is normal. No increase in right ventricular wall thickness. Right ventricular systolic function is moderately reduced. There is normal pulmonary artery systolic pressure. The tricuspid regurgitant velocity is 1.95 m/s, and with an assumed  right atrial pressure of 15 mmHg, the estimated right ventricular systolic pressure is 30.2 mmHg. Left Atrium: Left atrial size was normal in size. Right Atrium: Right atrial size was normal in size. Pericardium: There is no evidence of pericardial effusion. Mitral Valve: The mitral valve is grossly normal. Trivial mitral valve regurgitation. Tricuspid Valve: The tricuspid valve is grossly normal. Tricuspid valve regurgitation is mild. Aortic Valve: The aortic valve was not well visualized. Aortic valve regurgitation is not visualized. No aortic stenosis is present. Pulmonic Valve: The pulmonic valve was normal in structure. Pulmonic valve regurgitation is not visualized. Aorta: The aortic root and ascending aorta are structurally normal, with no evidence of dilitation. Venous: The inferior vena cava is dilated in size with less than 50% respiratory variability, suggesting right atrial pressure of 15 mmHg. IAS/Shunts: No atrial level shunt detected by color flow Doppler.   LV Volumes (MOD) LV vol d, MOD A2C: 88.0 ml Diastology LV vol d, MOD A4C: 93.5 ml LV e' medial:   7.46 cm/s LV vol s, MOD A2C: 48.2 ml LV E/e' medial: 12.6 LV vol s, MOD A4C: 30.2 ml LV SV MOD A2C:     39.8 ml LV SV MOD A4C:     93.5 ml LV SV MOD BP:      52.0 ml RIGHT VENTRICLE            IVC RV S prime:     7.00 cm/s  IVC diam: 2.10 cm TAPSE (M-mode): 1.2 cm LEFT ATRIUM             Index LA Vol (A2C):   59.2 ml 34.05  ml/m LA Vol (A4C):   46.8 ml 26.92 ml/m LA Biplane Vol: 53.2 ml 30.60 ml/m  MITRAL VALVE                TRICUSPID VALVE MV Area (PHT): 6.07 cm     TR Peak grad:   15.2 mmHg MV Decel Time: 125 msec     TR Vmax:        195.00 cm/s MV E velocity: 93.80 cm/s MV A velocity: 101.00 cm/s MV E/A ratio:  0.93 Vinie Maxcy MD Electronically signed by Vinie Maxcy MD Signature Date/Time: 09/11/2023/4:47:49 PM    Final    CT Angio Chest Pulmonary Embolism (PE) W or WO Contrast Result Date: 09/11/2023 CLINICAL DATA:  Cough, dyspnea, recent COVID EXAM: CT ANGIOGRAPHY CHEST WITH CONTRAST TECHNIQUE: Multidetector CT imaging of the chest was performed using the standard protocol during bolus administration of intravenous contrast. Multiplanar CT image reconstructions and MIPs were obtained to evaluate the vascular anatomy. RADIATION DOSE REDUCTION: This exam was performed according to the departmental dose-optimization program which includes automated exposure control, adjustment of the mA and/or kV according to patient size and/or use of iterative reconstruction technique. CONTRAST:  60mL OMNIPAQUE  IOHEXOL  350 MG/ML SOLN COMPARISON:  09/09/2023, 09/30/2022 FINDINGS: Cardiovascular: This is a technically adequate evaluation of the pulmonary vasculature. No filling defects or pulmonary emboli. The heart is unremarkable without pericardial effusion. Normal caliber of the thoracic aorta. Atherosclerosis of the aorta and coronary vasculature. Mediastinum/Nodes: Fluid-filled distal thoracic esophagus may be due to reflux. Thyroid and trachea are unremarkable. Borderline enlarged mediastinal lymph nodes are identified, measuring up to 11 mm in the right paratracheal region and 15 mm in the subcarinal region, likely reactive. Lungs/Pleura: Small bilateral pleural effusions, estimated less than 500 cc each. Minimal scattered linear and ground-glass opacities, greatest in the mid and lower lung zones, compatible with given history  of  recent COVID infection. No dense consolidation or pneumothorax. 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 Upper Abdomen: There is trace free fluid in the right upper quadrant with mild periportal edema, new since prior abdominal CT 08/08/2023. This is nonspecific. Musculoskeletal: No acute or destructive bony abnormalities. Reconstructed images demonstrate no additional findings. Review of the MIP images confirms the above findings. IMPRESSION: 1. No evidence of pulmonary embolus. 2. Scattered basilar predominant interstitial and ground-glass opacities compatible with given history of recent COVID pneumonia. 3. Small bilateral pleural effusions. 4. Borderline enlarged mediastinal adenopathy, likely reactive. 5. Nonspecific mild periportal edema and trace free fluid right upper quadrant, new since prior abdominal CT. 6. Aortic Atherosclerosis (ICD10-I70.0). Coronary artery atherosclerosis. 7. Findings suggesting underlying tracheobronchomalacia. Electronically Signed   By: Ozell Daring M.D.   On: 09/11/2023 15:01   VAS US  LOWER EXTREMITY VENOUS (DVT) (7a-7p) Result Date: 09/10/2023  Lower Venous DVT Study Patient Name:  JAMEKA IVIE  Date of Exam:   09/10/2023 Medical Rec #: [id number]         Accession #:    [id number] Date of Birth: [birth year]        Patient Gender: F Patient Age:   82 years Exam Location:  [facility] Procedure:      VAS US  LOWER EXTREMITY VENOUS (DVT) Referring Phys: TIMOTHY OPYD --------------------------------------------------------------------------------  Indications: Pain, Swelling, and Right foot. Other Indications: Diabetes, left BKA. Comparison Study: No priors. Performing Technologist: Ricka Sturdivant-Jones RDMS, RVT  Examination Guidelines: A complete evaluation includes B-mode imaging, spectral Doppler, color Doppler, and power Doppler as needed of all accessible portions of each vessel.  Bilateral testing is considered an integral part of a complete examination. Limited examinations for reoccurring indications may be performed as noted. The reflux portion of the exam is performed with the patient in reverse Trendelenburg.  +---------+---------------+---------+-----------+----------+--------------+ RIGHT    CompressibilityPhasicitySpontaneityPropertiesThrombus Aging +---------+---------------+---------+-----------+----------+--------------+ CFV      Full           Yes      Yes                                 +---------+---------------+---------+-----------+----------+--------------+ SFJ      Full                                                        +---------+---------------+---------+-----------+----------+--------------+ FV Prox  Full                                                        +---------+---------------+---------+-----------+----------+--------------+ FV Mid   Full                                                        +---------+---------------+---------+-----------+----------+--------------+ FV DistalFull                                                        +---------+---------------+---------+-----------+----------+--------------+  PFV      Full                                                        +---------+---------------+---------+-----------+----------+--------------+ POP      Full           Yes      Yes                                 +---------+---------------+---------+-----------+----------+--------------+ PTV      Full                                                        +---------+---------------+---------+-----------+----------+--------------+ PERO     Full                                                        +---------+---------------+---------+-----------+----------+--------------+   +----+---------------+---------+-----------+----------+--------------+  LEFTCompressibilityPhasicitySpontaneityPropertiesThrombus Aging +----+---------------+---------+-----------+----------+--------------+ CFV Full           Yes      Yes                                 +----+---------------+---------+-----------+----------+--------------+ SFJ Full                                                        +----+---------------+---------+-----------+----------+--------------+    Summary: RIGHT: - There is no evidence of deep vein thrombosis in the lower extremity.  - No cystic structure found in the popliteal fossa.  LEFT: - No evidence of common femoral vein obstruction.   *See table(s) above for measurements and observations. Electronically signed by Debby Robertson on 09/10/2023 at 4:30:57 PM.    Final    DG Chest 2 View Result Date: 09/09/2023 EXAM: 2 VIEW(S) XRAY OF THE CHEST 09/09/2023 07:55:00 PM COMPARISON: 08/08/2023 CLINICAL HISTORY: Cough. SOB, cough for over 2 weeks. History of COPD. FINDINGS: LUNGS AND PLEURA: No focal pulmonary opacity. No pulmonary edema. No pleural effusion. No pneumothorax. HEART AND MEDIASTINUM: No acute abnormality of the cardiac and mediastinal silhouettes. BONES AND SOFT TISSUES: Mild degenerative changes of the mid/lower thoracic spine. Cholecystectomy clips. No acute osseous abnormality. IMPRESSION: 1. No acute process. Electronically signed by: Pinkie Pebbles MD 09/09/2023 08:02 PM EDT RP Workstation: [id number]      Subjective: Denies complaints.  No pain or dyspnea reported.  She is aware that she will be going to SNF today.  Discharge Exam:  Vitals:   09/23/23 0744 09/23/23 0811 09/23/23 1001 09/23/23 1129  BP:  97/60 (!) 97/52 108/61  Pulse:  67  74  Resp:  16  16  Temp:  (!) 97.3 F (36.3 C)  98 F (36.7 C)  TempSrc:  Oral  Oral  SpO2: 98% 100%  100%  Weight:      Height:        General exam: Middle-age female, looks older than stated age, moderately built and nourished lying comfortably propped up  in bed without distress.  Respiratory system: Clear to auscultation.  No increased work of breathing. Cardiovascular system: S1 & S2 heard, RRR. No JVD, murmurs, rubs, gallops or clicks. No pedal edema.  Telemetry personally reviewed: Sinus rhythm. Gastrointestinal system: Abdomen is nondistended, soft and nontender. No organomegaly or masses felt. Normal bowel sounds heard. Central nervous system: Alert and oriented.  Rest of mental status as noted above.  No focal neurological deficits. Extremities: Symmetric 5 x 5 power.  Healed left BKA stump. Skin: No rashes, lesions or ulcers Psychiatry: Judgement and insight appear normal. Mood & affect appropriate.     The results of significant diagnostics from this hospitalization (including imaging, microbiology, ancillary and laboratory) are listed below for reference.     Microbiology: No results found for this or any previous visit (from the past 240 hours).   Labs: CBC: Recent Labs  Lab 09/17/23 0457 09/18/23 0416 09/19/23 0428  WBC 12.9* 13.8* 9.3  HGB 9.7* 9.6* 9.1*  HCT 31.5* 31.6* 30.7*  MCV 90.3 93.8 93.0  PLT 404* 380 351    Basic Metabolic Panel: Recent Labs  Lab 09/17/23 0457 09/18/23 0416 09/19/23 0428  NA 134* 134* 134*  K 4.2 4.2 4.3  CL 104 106 105  CO2 24 17* 24  GLUCOSE 90 71 103*  BUN 24* 20 20  CREATININE 1.29* 1.13* 1.13*  CALCIUM  8.1* 7.9* 8.2*  MG 2.2 2.2 2.3     CBG: Recent Labs  Lab 09/22/23 2153 09/22/23 2345 09/23/23 0333 09/23/23 0817 09/23/23 1144  GLUCAP 77 91 103* 105* 156*     Urinalysis    Component Value Date/Time   COLORURINE YELLOW 09/09/2023 2336   APPEARANCEUR HAZY (A) 09/09/2023 2336   LABSPEC 1.008 09/09/2023 2336   PHURINE 5.0 09/09/2023 2336   GLUCOSEU NEGATIVE 09/09/2023 2336   HGBUR MODERATE (A) 09/09/2023 2336   BILIRUBINUR NEGATIVE 09/09/2023 2336   KETONESUR NEGATIVE 09/09/2023 2336   PROTEINUR NEGATIVE 09/09/2023 2336   UROBILINOGEN 1.0 06/23/2014 1950    NITRITE NEGATIVE 09/09/2023 2336   LEUKOCYTESUR LARGE (A) 09/09/2023 2336      Time coordinating discharge: 45 minutes  SIGNED:  Trenda Mar, MD,  FACP, [facility], [facility], [facility]   [facility] [place]     To contact the attending provider between 7A-7P or the covering provider during after hours 7P-7A, please log into the web site [email or web address] and access using universal [place] password for that web site. If you do not have the password, please call the hospital operator.

## 2023-09-23 NOTE — Progress Notes (Signed)
 Mobility Specialist Progress Note;   09/23/23 1119  Mobility  Activity Stood at bedside (chair)  Level of Assistance Minimal assist, patient does 75% or more  Assistive Device Front wheel walker  Activity Response Tolerated well  Mobility Referral Yes  Mobility visit 1 Mobility  Mobility Specialist Start Time (ACUTE ONLY) 1119  Mobility Specialist Stop Time (ACUTE ONLY) 1142  Mobility Specialist Time Calculation (min) (ACUTE ONLY) 23 min   Pt had just transferred to chair w/ NT upon arrival, still agreeable to mobility. Required MinA to stand from chair and perform 5x STS w/o fault. BP 108/61 during session. No c/o when asked. Pt left in chair with all needs met, call bell in reach.   Lauraine Erm Mobility Specialist Please contact via SecureChat or [employer] [phone]

## 2023-09-23 NOTE — Progress Notes (Signed)
   09/23/23 1631  Spiritual Encounters  Type of Visit Initial  Care provided to: Pt and family  Referral source Clinical staff  Reason for visit Routine spiritual support  OnCall Visit No  Spiritual Framework  Presenting Themes Meaning/purpose/sources of inspiration;Values and beliefs;Impactful experiences and emotions;Caregiving needs  Community/Connection Family  Strengths God  Needs/Challenges/Barriers Encouragement  Patient Stress Factors Exhausted;Major life changes;Health changes  Family Stress Factors Health changes  Interventions  Spiritual Care Interventions Made Prayer;Established relationship of care and support;Normalization of emotions  Intervention Outcomes  Outcomes Awareness of health;Awareness of support;Reduced anxiety  Mental Health Advance Directives  Does Patient Have a Mental Health Advance Directive? No   Chaplain Visit Note- Advance Directive Discussion  Chaplain met with the Pt. To discuss matters related to the (AD). The Pt.expressed a desire to further consider her decisions, particularly in her her ongoing recovery and adjustment to life post-surgery.  Pt. Voiced specific concerns about future care planning and stated that she would like her son Kyani Simkin) to ser as her primary decision-maker and her granddaughter  Desare Wilkes-Chaddell as her secondary.  Although the pt declined to complete a formal healthcare POA document at this time, she was clear in her intent to her have medical records reflect intent to have these preference regarding decision- making in the hospital setting.  Chaplain provided a listening presence acknowledge that Pt's concerns, and offered a spiritual  support. Upon request, a word of prayer was shared with the Pt.for peace, clarity, and strength moving forward.

## 2023-09-23 NOTE — Progress Notes (Signed)
 Pt not ready to be placed on cpap at this time Pt instructed to call when ready. Verbalizes understanding. RT will continue to be available as needed.

## 2023-10-23 ENCOUNTER — Observation Stay (HOSPITAL_COMMUNITY)
Admission: EM | Admit: 2023-10-23 | Discharge: 2023-10-24 | Disposition: A | Discharge status: Home / Self Care | Payer: Medicare (Managed Care) | Attending: Internal Medicine | Admitting: Internal Medicine

## 2023-10-23 ENCOUNTER — Emergency Department (HOSPITAL_COMMUNITY): Payer: Medicare (Managed Care)

## 2023-10-23 ENCOUNTER — Other Ambulatory Visit: Payer: Self-pay

## 2023-10-23 ENCOUNTER — Encounter (HOSPITAL_COMMUNITY): Payer: Self-pay

## 2023-10-23 DIAGNOSIS — I4891 Unspecified atrial fibrillation: Principal | ICD-10-CM

## 2023-10-23 DIAGNOSIS — Z89512 Acquired absence of left leg below knee: Secondary | ICD-10-CM | POA: Diagnosis not present

## 2023-10-23 DIAGNOSIS — M48062 Spinal stenosis, lumbar region with neurogenic claudication: Secondary | ICD-10-CM | POA: Diagnosis not present

## 2023-10-23 DIAGNOSIS — I130 Hypertensive heart and chronic kidney disease with heart failure and stage 1 through stage 4 chronic kidney disease, or unspecified chronic kidney disease: Secondary | ICD-10-CM | POA: Insufficient documentation

## 2023-10-23 DIAGNOSIS — E1152 Type 2 diabetes mellitus with diabetic peripheral angiopathy with gangrene: Secondary | ICD-10-CM | POA: Diagnosis not present

## 2023-10-23 DIAGNOSIS — Z91040 Latex allergy status: Secondary | ICD-10-CM | POA: Insufficient documentation

## 2023-10-23 DIAGNOSIS — I5023 Acute on chronic systolic (congestive) heart failure: Secondary | ICD-10-CM | POA: Diagnosis not present

## 2023-10-23 DIAGNOSIS — I5020 Unspecified systolic (congestive) heart failure: Secondary | ICD-10-CM | POA: Diagnosis present

## 2023-10-23 DIAGNOSIS — I959 Hypotension, unspecified: Secondary | ICD-10-CM | POA: Insufficient documentation

## 2023-10-23 DIAGNOSIS — R570 Cardiogenic shock: Secondary | ICD-10-CM | POA: Insufficient documentation

## 2023-10-23 DIAGNOSIS — I5043 Acute on chronic combined systolic (congestive) and diastolic (congestive) heart failure: Secondary | ICD-10-CM | POA: Diagnosis not present

## 2023-10-23 DIAGNOSIS — G4733 Obstructive sleep apnea (adult) (pediatric): Secondary | ICD-10-CM | POA: Insufficient documentation

## 2023-10-23 DIAGNOSIS — J9600 Acute respiratory failure, unspecified whether with hypoxia or hypercapnia: Secondary | ICD-10-CM | POA: Diagnosis not present

## 2023-10-23 DIAGNOSIS — Z79899 Other long term (current) drug therapy: Secondary | ICD-10-CM | POA: Insufficient documentation

## 2023-10-23 DIAGNOSIS — F39 Unspecified mood [affective] disorder: Secondary | ICD-10-CM | POA: Insufficient documentation

## 2023-10-23 DIAGNOSIS — R55 Syncope and collapse: Secondary | ICD-10-CM | POA: Diagnosis present

## 2023-10-23 DIAGNOSIS — N1831 Chronic kidney disease, stage 3a: Secondary | ICD-10-CM | POA: Insufficient documentation

## 2023-10-23 DIAGNOSIS — F419 Anxiety disorder, unspecified: Secondary | ICD-10-CM | POA: Insufficient documentation

## 2023-10-23 DIAGNOSIS — K219 Gastro-esophageal reflux disease without esophagitis: Secondary | ICD-10-CM | POA: Diagnosis not present

## 2023-10-23 DIAGNOSIS — Z8673 Personal history of transient ischemic attack (TIA), and cerebral infarction without residual deficits: Secondary | ICD-10-CM | POA: Diagnosis not present

## 2023-10-23 DIAGNOSIS — N179 Acute kidney failure, unspecified: Secondary | ICD-10-CM | POA: Insufficient documentation

## 2023-10-23 DIAGNOSIS — H547 Unspecified visual loss: Secondary | ICD-10-CM | POA: Insufficient documentation

## 2023-10-23 DIAGNOSIS — J449 Chronic obstructive pulmonary disease, unspecified: Secondary | ICD-10-CM | POA: Diagnosis not present

## 2023-10-23 DIAGNOSIS — E1122 Type 2 diabetes mellitus with diabetic chronic kidney disease: Secondary | ICD-10-CM | POA: Insufficient documentation

## 2023-10-23 DIAGNOSIS — I739 Peripheral vascular disease, unspecified: Secondary | ICD-10-CM | POA: Insufficient documentation

## 2023-10-23 DIAGNOSIS — Z7901 Long term (current) use of anticoagulants: Secondary | ICD-10-CM | POA: Diagnosis not present

## 2023-10-23 DIAGNOSIS — I480 Paroxysmal atrial fibrillation: Principal | ICD-10-CM | POA: Insufficient documentation

## 2023-10-23 DIAGNOSIS — Z794 Long term (current) use of insulin: Secondary | ICD-10-CM | POA: Insufficient documentation

## 2023-10-23 DIAGNOSIS — J441 Chronic obstructive pulmonary disease with (acute) exacerbation: Secondary | ICD-10-CM | POA: Diagnosis not present

## 2023-10-23 DIAGNOSIS — E785 Hyperlipidemia, unspecified: Secondary | ICD-10-CM | POA: Diagnosis not present

## 2023-10-23 DIAGNOSIS — E119 Type 2 diabetes mellitus without complications: Secondary | ICD-10-CM

## 2023-10-23 LAB — COMPREHENSIVE METABOLIC PANEL WITH GFR
ALT: 14 U/L (ref 0–44)
AST: 37 U/L (ref 15–41)
Albumin: 2.6 g/dL — ABNORMAL LOW (ref 3.5–5.0)
Alkaline Phosphatase: 98 U/L (ref 38–126)
Anion gap: 18 — ABNORMAL HIGH (ref 5–15)
BUN: 23 mg/dL (ref 8–23)
CO2: 13 mmol/L — ABNORMAL LOW (ref 22–32)
Calcium: 8.5 mg/dL — ABNORMAL LOW (ref 8.9–10.3)
Chloride: 106 mmol/L (ref 98–111)
Creatinine, Ser: 1.72 mg/dL — ABNORMAL HIGH (ref 0.44–1.00)
GFR, Estimated: 32 mL/min — ABNORMAL LOW (ref >60)
Glucose, Bld: 149 mg/dL — ABNORMAL HIGH (ref 70–99)
Potassium: 5.4 mmol/L — ABNORMAL HIGH (ref 3.5–5.1)
Sodium: 137 mmol/L (ref 135–145)
Total Bilirubin: 1.3 mg/dL — ABNORMAL HIGH (ref 0.0–1.2)
Total Protein: 5.5 g/dL — ABNORMAL LOW (ref 6.5–8.1)

## 2023-10-23 LAB — I-STAT VENOUS BLOOD GAS, ED
Acid-base deficit: 13 mmol/L — ABNORMAL HIGH (ref 0.0–2.0)
Bicarbonate: 13.5 mmol/L — ABNORMAL LOW (ref 20.0–28.0)
Calcium, Ion: 1.08 mmol/L — ABNORMAL LOW (ref 1.15–1.40)
HCT: 36 % (ref 36.0–46.0)
Hemoglobin: 12.2 g/dL (ref 12.0–15.0)
O2 Saturation: 85 %
Potassium: 4.8 mmol/L (ref 3.5–5.1)
Sodium: 137 mmol/L (ref 135–145)
TCO2: 15 mmol/L — ABNORMAL LOW (ref 22–32)
pCO2, Ven: 32.7 mmHg — ABNORMAL LOW (ref 44–60)
pH, Ven: 7.225 — ABNORMAL LOW (ref 7.25–7.43)
pO2, Ven: 58 mmHg — ABNORMAL HIGH (ref 32–45)

## 2023-10-23 LAB — LACTIC ACID, PLASMA
Lactic Acid, Venous: 3.1 mmol/L (ref 0.5–1.9)
Lactic Acid, Venous: 2.7 mmol/L (ref 0.5–1.9)

## 2023-10-23 LAB — CBC
HCT: 35.2 % — ABNORMAL LOW (ref 36.0–46.0)
Hemoglobin: 10.3 g/dL — ABNORMAL LOW (ref 12.0–15.0)
MCH: 27.5 pg (ref 26.0–34.0)
MCHC: 29.3 g/dL — ABNORMAL LOW (ref 30.0–36.0)
MCV: 94.1 fL (ref 80.0–100.0)
Platelets: 407 K/uL — ABNORMAL HIGH (ref 150–400)
RBC: 3.74 MIL/uL — ABNORMAL LOW (ref 3.87–5.11)
RDW: 16.4 % — ABNORMAL HIGH (ref 11.5–15.5)
WBC: 10.6 K/uL — ABNORMAL HIGH (ref 4.0–10.5)
nRBC: 0.2 % (ref 0.0–0.2)

## 2023-10-23 LAB — BRAIN NATRIURETIC PEPTIDE: B Natriuretic Peptide: 1791.4 pg/mL — ABNORMAL HIGH (ref 0.0–100.0)

## 2023-10-23 LAB — TROPONIN I (HIGH SENSITIVITY): Troponin I (High Sensitivity): 131 ng/L (ref <18)

## 2023-10-23 LAB — CBG MONITORING, ED: Glucose-Capillary: 137 mg/dL — ABNORMAL HIGH (ref 70–99)

## 2023-10-23 MED ORDER — OLANZAPINE 5 MG PO TBDP: 5.0000 mg | ORAL_TABLET | Freq: Every day | ORAL | Status: DC

## 2023-10-23 MED ORDER — SODIUM CHLORIDE 0.9% FLUSH
3.0000 mL | Freq: Two times a day (BID) | INTRAVENOUS | Status: DC
  Administered 2023-10-23: 3 mL via INTRAVENOUS

## 2023-10-23 MED ORDER — ACETAMINOPHEN 325 MG PO TABS: 650.0000 mg | ORAL_TABLET | Freq: Four times a day (QID) | ORAL | Status: DC | PRN

## 2023-10-23 MED ORDER — LORAZEPAM 1 MG PO TABS: 1.0000 mg | ORAL_TABLET | ORAL | Status: DC | PRN

## 2023-10-23 MED ORDER — SODIUM CHLORIDE 0.9 % IV BOLUS
500.0000 mL | Freq: Once | INTRAVENOUS | Status: AC
  Administered 2023-10-23: 500 mL via INTRAVENOUS

## 2023-10-23 MED ORDER — DILTIAZEM HCL-DEXTROSE 125-5 MG/125ML-% IV SOLN (PREMIX)
5.0000 mg/h | INTRAVENOUS | Status: DC
  Filled 2023-10-23: qty 125

## 2023-10-23 MED ORDER — ACETAMINOPHEN 650 MG RE SUPP: 650.0000 mg | Freq: Four times a day (QID) | RECTAL | Status: DC | PRN

## 2023-10-23 MED ORDER — FENTANYL CITRATE PF 50 MCG/ML IJ SOSY: 25.0000 ug | PREFILLED_SYRINGE | INTRAMUSCULAR | Status: DC | PRN

## 2023-10-23 MED ORDER — ONDANSETRON HCL 4 MG PO TABS: 4.0000 mg | ORAL_TABLET | Freq: Four times a day (QID) | ORAL | Status: DC | PRN

## 2023-10-23 MED ORDER — ONDANSETRON HCL 4 MG/2ML IJ SOLN: 4.0000 mg | Freq: Four times a day (QID) | INTRAMUSCULAR | Status: DC | PRN

## 2023-10-23 MED ORDER — OXYCODONE HCL 5 MG/5ML PO SOLN: 5.0000 mg | ORAL | Status: DC | PRN

## 2023-10-23 MED ORDER — POLYETHYLENE GLYCOL 3350 17 G PO PACK: 17.0000 g | PACK | Freq: Every day | ORAL | Status: DC | PRN

## 2023-10-23 MED ORDER — OXYCODONE HCL 20 MG/ML PO CONC: 5.0000 mg | ORAL | Status: DC | PRN

## 2023-10-23 MED ORDER — LORAZEPAM 2 MG/ML PO CONC: 1.0000 mg | ORAL | Status: DC | PRN

## 2023-10-23 NOTE — H&P (Signed)
 History and Physical   Anna [name] FMW:[id number] DOB: February 18, 1956 DOA: 10/23/2023  PCP: Cloria Annabella CROME, DO   Patient coming from: Home/nursing facility  Chief Complaint: Syncope, confusion  HPI: Anna [name] is a 68 y.o. female with medical history significant of GERD, hyperlipidemia, diabetes, PAD, CKD 3A, CVA, CHF, left eye vision loss, spinal stenosis, CAD, anxiety, mood disorder presenting with syncope and confusion.  Patient has had significant decline this year.  She had a stroke in March.  Subsequently had a MI in June with acute CHF.  At that time patient was found to have severe disease with 100% occluded RCA and lesions of the LAD of 70, 90%.  CT surgery was consulted and patient was not a candidate for CABG.  Atherectomy was attempted but was unsuccessful secondary to some side effects from her polypharmacy.  Following this patient decided she no longer wished to pursue aggressive measures and became DNR/DNI/would not want cardioversion or other aggressive treatments.  Was starting to follow with palliative care/hospice.  At facility today, granddaughter came to visit her and noted that she was unwell with confusion and appeared pale.  Tried to get her up to get her dressed with staff and patient syncopized and received CPR briefly despite being DNR.  Recovered and EMS was called and found to be hypotensive with A-fib with RVR in the 130s.  Brought to the ED for further evaluation.  Patient without specific complaints. Somewhat confused.  ED Course: Vital signs in the ED notable for blood pressure in the 70s-110 systolic, temperature 96.8, heart rate in the 60s-140s, respirate in the teens-20s.  Requiring 2 L to maintain saturations.  Lab workup included CMP with potassium 5.4, bicarb 13, creatinine elevated to 1.72 from baseline 1.2, glucose 149, calcium  8.5, protein 5.5, albumin 2.6, T. bili 1.3.  CBC with leukocytosis to 10.6, hemoglobin stable at 10.3, platelets 4 7.   BNP elevated to 1791, lactic acid 3.1, 2.7.  Troponin 131, repeat pending.  VBG with acidemia and pH 7.22 and pCO2 32.7.  Urinalysis pending.  Chest x-ray with mild cardiomegaly with changes consistent with CHF as well as small pleural effusion.  Patient received 1 L IV fluids in the ED.  Case was discussed with cardiology on-call who suspected cardiogenic shock component with her rapid heart rate, elevated BNP elevated lactic acid, hypotension.  Given this presentation and patient's wish to avoid aggressive measures, DNR/DNI, wanting to avoid hospitalization and focus on comfort Case was discussed with family and patient has been made full comfort care.  Palliative medicine has been consulted in the ED.  Review of Systems: Patient without specific complaints. Somewhat confused.  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 11/01/2015   COPD (chronic obstructive pulmonary disease) (HCC)    Depression    Diabetes mellitus    Diabetic neuropathy (HCC)    Diabetic retinopathy    GERD (gastroesophageal reflux disease)    Hyperlipemia    Hypertension    Migraine    Obesity    Pneumonia    Sepsis due to urinary tract infection (HCC)    Sepsis secondary to UTI (HCC) 09/30/2022   Stroke (HCC)    2020    Past Surgical History:  Procedure  Laterality Date   ABDOMINAL AORTOGRAM W/LOWER EXTREMITY Left 08/22/2021   Procedure: ABDOMINAL AORTOGRAM W/LOWER EXTREMITY;  Surgeon: Anna Gaile ORN, MD;  Location: [facility];  Service: Cardiovascular;  Laterality: Left;   AMPUTATION Left 10/11/2021   Procedure: LEFT BELOW KNEE AMPUTATION;  Surgeon: Anna Jerona GAILS, MD;  Location: [facility] OR;  Service: Orthopedics;  Laterality: Left;   CESAREAN SECTION     x3   CHOLECYSTECTOMY     CORONARY STENT INTERVENTION N/A 09/18/2023    Procedure: CORONARY STENT INTERVENTION;  Surgeon: Anna Newman PARAS, MD;  Location: [facility];  Service: Cardiovascular;  Laterality: N/A;   CYSTOSCOPY WITH RETROGRADE PYELOGRAM, URETEROSCOPY AND STENT PLACEMENT Right 02/06/2023   Procedure: CYSTOSCOPY WITH RIGHT  RETROGRADE PYELOGRAM AND RIGHT URETERAL STENT EXCHANGE;  Surgeon: Anna Lonni Righter, MD;  Location: [facility];  Service: Urology;  Laterality: Right;  30 MINUTES   CYSTOSCOPY WITH STENT PLACEMENT Right 09/30/2022   Procedure: CYSTOSCOPY WITH STENT PLACEMENT;  Surgeon: Anna Sherwood JONETTA DOUGLAS, MD;  Location: [facility] OR;  Service: Urology;  Laterality: Right;   EYE SURGERY Left    retina surgery had laser   LEFT HEART CATH AND CORONARY ANGIOGRAPHY N/A 09/13/2023   Procedure: LEFT HEART CATH AND CORONARY ANGIOGRAPHY;  Surgeon: Anna Newman PARAS, MD;  Location: [facility];  Service: Cardiovascular;  Laterality: N/A;   METATARSAL HEAD EXCISION Left 08/24/2021   Procedure: METATARSAL HEAD EXCISION FIFTH TOE;  Surgeon: Anna [name], DPM;  Location: [facility];  Service: Podiatry;  Laterality: Left;   PERIPHERAL VASCULAR BALLOON ANGIOPLASTY  08/22/2021   Procedure: PERIPHERAL VASCULAR BALLOON ANGIOPLASTY;  Surgeon: Anna Gaile ORN, MD;  Location: [facility];  Service: Cardiovascular;;  Left AT   WOUND DEBRIDEMENT Left 08/24/2021   Procedure: DEBRIDEMENT WOUND OF LEFT FOOT;  Surgeon: Anna [name], DPM;  Location: [facility] OR;  Service: Podiatry;  Laterality: Left;    Social History  reports that she has never smoked. She has never used smokeless tobacco. She reports that she does not drink alcohol and does not use drugs.  Allergies  Allergen Reactions   Lasix [Furosemide] Shortness Of Breath and Rash   Bacitracin Other (See Comments)    Burns skin     Ciprofloxacin  Swelling   Citalopram Diarrhea   Doxycycline  Swelling and Other (See Comments)    burning all over body     Latex Itching   Morphine And  Codeine  Hives   Duloxetine Other (See Comments)    Mental Status Changes (intolerance) saw pictures of gun   Levaquin [Levofloxacin In D5w] Other (See Comments)    Pt does not remember reaction but states she's had issues with med   Salvia Officinalis Other (See Comments)    Sage- sneezing    Soma [Carisoprodol] Other (See Comments)    Sleepy and constipation     History reviewed. No pertinent family history.   Prior to Admission medications   Medication Sig Start Date End Date Taking? Authorizing Provider  acetaminophen  (TYLENOL ) 325 MG tablet Take 2 tablets (650 mg total) by mouth every 4 (four) hours as needed for headache, mild pain (pain score 1-3) or moderate pain (pain score 4-6). 09/23/23   Hongalgi, Anand D, MD  albuterol  (VENTOLIN  HFA) 108 (90 Base) MCG/ACT inhaler Inhale 1-2 puffs into the lungs every 6 (six) hours as needed for wheezing or shortness of breath. 10/04/22   Singh, Prashant K, MD  aspirin  EC 81 MG tablet Take 81 mg  by mouth daily. Swallow whole.    [provider]  atorvastatin  (LIPITOR ) 80 MG tablet Take 1 tablet (80 mg total) by mouth at bedtime. 06/04/23   Regalado, Belkys A, MD  busPIRone  (BUSPAR ) 7.5 MG tablet Take 7.5 mg by mouth 2 (two) times daily.    [provider]  Dulaglutide 3 MG/0.5ML SOPN Inject 3 mg into the skin every Saturday.    [provider]  estradiol (ESTRACE) 0.1 MG/GM vaginal cream Place 1 Applicatorful vaginally as needed (dryness / irritation).    [provider]  ferrous sulfate  325 (65 FE) MG tablet Take 325 mg by mouth daily with breakfast.    [provider]  FLUoxetine  (PROZAC ) 40 MG capsule Take 40 mg by mouth daily.    [provider]  fluticasone (FLONASE) 50 MCG/ACT nasal spray Place 1 spray into both nostrils daily as needed for allergies or rhinitis.    [provider]  gabapentin  (NEURONTIN ) 400 MG capsule Take 400 mg by mouth 3 (three) times daily.    [provider]  guaiFENesin -dextromethorphan  (ROBITUSSIN DM) 100-10 MG/5ML syrup Take 10 mLs by mouth every 4 (four) hours as needed for cough. 09/23/23   Hongalgi, Anand D, MD  hydrOXYzine  (ATARAX ) 10 MG tablet Take 1 tablet (10 mg total) by mouth every 8 (eight) hours as needed for anxiety. 09/23/23   Hongalgi, Anand D, MD  insulin  aspart (NOVOLOG ) 100 UNIT/ML FlexPen Inject 0-9 Units into the skin 3 (three) times daily with meals. CBG < 70: Implement Hypoglycemia protocol, CBG 70 - 120: 0 units CBG 121 - 150: 1 unit CBG 151 - 200: 2 units CBG 201 - 250: 3 units CBG 251 - 300: 5 units CBG 301 - 350: 7 units CBG 351 - 400: 9 units CBG > 400: call MD. 09/23/23   Hongalgi, Anand D, MD  insulin  glargine-yfgn (SEMGLEE ) 100 UNIT/ML injection Inject 0.07 mLs (7 Units total) into the skin daily. 09/24/23   Hongalgi, Anand D, MD  ipratropium-albuterol  (DUONEB) 0.5-2.5 (3) MG/3ML SOLN Use 3 ml by nebulization twice a day scheduled and every 4 hours as needed for shortness of breath and wheezing Patient taking differently: Take 3 mLs by nebulization daily as needed (SOB and wheezing). Use 3 ml by nebulization twice a day scheduled and every 4 hours as needed for shortness of breath and wheezing 10/04/22   Singh, Prashant K, MD  loratadine  (CLARITIN ) 10 MG tablet Take 10 mg by mouth daily as needed for allergies.    [provider]  meclizine (ANTIVERT) 25 MG tablet Take 25 mg by mouth daily as needed for dizziness. 10/01/19   [provider]  metFORMIN  (GLUCOPHAGE ) 1000 MG tablet Take 1,000 mg by mouth 2 (two) times daily with a meal.    [provider]  metoprolol  succinate (TOPROL -XL) 25 MG 24 hr tablet Take 1 tablet (25 mg total) by mouth daily. 09/24/23   Hongalgi, Anand D, MD  nystatin  (MYCOSTATIN ) 100000 UNIT/ML suspension Take 1 mL by mouth 4 (four) times daily as needed (red gums).    [provider]  ondansetron  (ZOFRAN -ODT) 4 MG disintegrating tablet Take 1 tablet (4 mg total)  by mouth every 8 (eight) hours as needed for nausea or vomiting. 08/08/23   Ula Prentice SAUNDERS, MD  pantoprazole  (PROTONIX ) 40 MG tablet Take 40 mg by mouth 2 (two) times daily.    [provider]  rOPINIRole  (REQUIP ) 0.25 MG tablet Take 0.25 mg by mouth at bedtime.  [provider]  sennosides-docusate sodium  (SENOKOT-S) 8.6-50 MG tablet Take 1 tablet by mouth 2 (two) times daily as needed for constipation.    [provider]  tamsulosin  (FLOMAX ) 0.4 MG CAPS capsule Take 1 capsule (0.4 mg total) by mouth daily. 10/04/22   Singh, Prashant K, MD  topiramate  (TOPAMAX ) 50 MG tablet Take 1 tablet (50 mg total) by mouth 2 (two) times daily. 06/05/23   Davia Nydia POUR, MD    Physical Exam: Vitals:   10/23/23 1330 10/23/23 1400 10/23/23 1420 10/23/23 1430  BP: (!) 84/61 91/60  (!) 126/104  Pulse:   60 (!) 117  Resp: 19 19 18 14   Temp:      TempSrc:      SpO2: 96% 96% 100% 99%    Physical Exam Constitutional:      General: She is not in acute distress.    Appearance: She is obese. She is ill-appearing.  HENT:     Head: Normocephalic and atraumatic.     Mouth/Throat:     Mouth: Mucous membranes are moist.     Pharynx: Oropharynx is clear.  Eyes:     Extraocular Movements: Extraocular movements intact.     Pupils: Pupils are equal, round, and reactive to light.  Cardiovascular:     Rate and Rhythm: Tachycardia present. Rhythm irregular.     Pulses: Normal pulses.     Heart sounds: Normal heart sounds.  Pulmonary:     Effort: Pulmonary effort is normal. No respiratory distress.     Breath sounds: Rales present.  Abdominal:     General: Bowel sounds are normal. There is no distension.     Palpations: Abdomen is soft.     Tenderness: There is no abdominal tenderness.  Musculoskeletal:        General: No swelling or deformity.     Right lower leg: Edema present.     Left lower leg: Edema present.  Skin:    Comments: Cool extremites  Neurological:     Comments:  Drowsy    Labs on Admission: I have personally reviewed following labs and imaging studies  CBC: Recent Labs  Lab 10/23/23 1139 10/23/23 1555  WBC 10.6*  --   HGB 10.3* 12.2  HCT 35.2* 36.0  MCV 94.1  --   PLT 407*  --     Basic Metabolic Panel: Recent Labs  Lab 10/23/23 1139 10/23/23 1555  NA 137 137  K 5.4* 4.8  CL 106  --   CO2 13*  --   GLUCOSE 149*  --   BUN 23  --   CREATININE 1.72*  --   CALCIUM  8.5*  --     GFR: CrCl cannot be calculated (Unknown ideal weight.).  Liver Function Tests: Recent Labs  Lab 10/23/23 1139  AST 37  ALT 14  ALKPHOS 98  BILITOT 1.3*  PROT 5.5*  ALBUMIN 2.6*    Urine analysis:    Component Value Date/Time   COLORURINE YELLOW 09/09/2023 2336   APPEARANCEUR HAZY (A) 09/09/2023 2336   LABSPEC 1.008 09/09/2023 2336   PHURINE 5.0 09/09/2023 2336   GLUCOSEU NEGATIVE 09/09/2023 2336   HGBUR MODERATE (A) 09/09/2023 2336   BILIRUBINUR NEGATIVE 09/09/2023 2336   KETONESUR NEGATIVE 09/09/2023 2336   PROTEINUR NEGATIVE 09/09/2023 2336   UROBILINOGEN 1.0 06/23/2014 1950   NITRITE NEGATIVE 09/09/2023 2336   LEUKOCYTESUR LARGE (A) 09/09/2023 2336    Radiological Exams on Admission: DG Chest Port 1 View Result Date: 10/23/2023 CLINICAL DATA:  Tachycardia. EXAM: PORTABLE CHEST 1 VIEW COMPARISON:  Chest radiograph dated 09/09/2023. FINDINGS: There is mild cardiomegaly with mild vascular congestion. Small bilateral pleural effusions and bibasilar atelectasis or infiltrate. No pneumothorax. No acute osseous pathology. IMPRESSION: Mild cardiomegaly with mild CHF and small bilateral pleural effusions. Electronically Signed   By: Vanetta Chou M.D.   On: 10/23/2023 13:53   EKG: Independently reviewed.  New atrial fibrillation with RVR at 136 bpm.  Nonspecific T wave changes.  Low voltage multiple leads.  Assessment/Plan Active Problems:   History of stroke   COPD mixed type (HCC)   Type 2 diabetes mellitus (HCC)   OSA (obstructive  sleep apnea)   GERD (gastroesophageal reflux disease)   Mood disorder (HCC)   Anxiety   Hyperlipidemia   PAD (peripheral artery disease) (HCC)   Spinal stenosis of lumbar region with neurogenic claudication   CKD stage 3a, GFR 45-59 ml/min (HCC)   HFrEF (heart failure with reduced ejection fraction) (HCC)   Status post below-knee amputation of left lower extremity (HCC)   Cardiogenic shock (HCC)   Paroxysmal atrial fibrillation with RVR (HCC)   Acute renal failure superimposed on stage 3a chronic kidney disease (HCC)   Atrial fibrillation with RVR, new onset AKI on CKD 3A Acute on chronic combined systolic and diastolic CHF Cardiogenic shock Acute respiratory failure. > Presenting with confusion, syncope, hypotension, A-fib with RVR.  Found to have AKI. > BNP 1791, lactic acid 3.1, 2.7, troponin 131.  Blood pressure persistently in the 70-80 systolic.  Now with oxygen requirement. > This is consistent with cardiogenic shock in the setting of A-fib with RVR and acute on chronic combined heart failure.  Known advanced CAD as per HPI with patient not being a candidate for CT surgery and declining further measures.  > After discussion with family and ED provider as well as cardiologist and given patient's known wishes for DNR/DNI/no cardioversion/avoid hospitalization/comfort measures; it was decided to make patient full comfort care.  Pallor medicine has been consulted. - Admit to the palliative floor - Appreciate palliative medicine recommendations and assistance - Comfort care orders including as needed medication for pain, anxiety.  GERD Hyperlipidemia Diabetes PAD CAD CVA Status post left BKA OSA Vision loss Spinal stenosis - Focus on comfort as above - Will add back any medications as appropriate in the setting of patient's focus on comfort  DVT prophylaxis: None, comfort care Code Status:   DNR/comfort care Family Communication:  Updated at bedside.  Disposition Plan:    Patient is from:  Home/nursing facility  Anticipated DC to:  Hospice facility versus in-home hospice versus in-hospital death  Anticipated DC date:  1 to 2 days  Anticipated DC barriers: Placement?  Consults called:  Palliative medicine, case discussed with cardiology in the ED Admission status:  Observation, palliative floor  Severity of Illness: The appropriate patient status for this patient is OBSERVATION. Observation status is judged to be reasonable and necessary in order to provide the required intensity of service to ensure the patient's safety. The patient's presenting symptoms, physical exam findings, and initial radiographic and laboratory data in the context of their medical condition is felt to place them at decreased risk for further clinical deterioration. Furthermore, it is anticipated that the patient will be medically stable for discharge from the hospital within 2 midnights of admission.    Marsa KATHEE Scurry MD [facility]  How to contact the [facility] Attending or Consulting provider 7A - 7P or covering provider during after hours 7P -  7A, for this patient?   Check the care team in [facility] and look for a) attending/consulting [facility] provider listed and b) the [facility] team listed Log into [email or web address] and use [facility] universal password to access. If you do not have the password, please contact the hospital operator. Locate the [facility] provider you are looking for under [facility] and page to a number that you can be directly reached. If you still have difficulty reaching the provider, please page the [facility] (Director on Call) for the Hospitalists listed on amion for assistance.  10/23/2023, 4:34 PM

## 2023-10-23 NOTE — ED Notes (Signed)
 5N made aware of patient transport to unit at this time.

## 2023-10-23 NOTE — ED Provider Notes (Signed)
 [place] EMERGENCY DEPARTMENT AT [facility] Provider Note   CSN: [id number] Arrival date & time: 10/23/23  1127     Patient presents with: Altered Mental Status and Atrial Fibrillation   Anna [name] is a 68 y.o. female.   Pt is a 67y/o female with hx of COPD, CVA, IDDM-2, left BKA, HTN, triple-vessel disease who could not tolerate stent in the LAD and due to low blood pressure catheterization was aborted and patient decided within the last month she would not want to go for more aggressive care such as bypass and was placed on hospice who is currently at a rehab facility and her granddaughter came by to see her today and noticed immediately that she was not herself.  She was pale and seemed to be a bit confused.  She was going to take her out for lunch and patient had had her morning medications but had not had anything to eat.  Her granddaughter asked them to get her dressed and when they moved her from the bed to the chair she slumped over and had a syncopal event.  Her granddaughter said they started chest compressions although patient has reported she is a DNR and does not want compressions or intubation.  She did wake up spontaneously.  When EMS arrived they noted patient to be hypotensive with new A-fib RVR with rates in the 130s.  She was also on nasal cannula oxygen which she does not normally need.  Patient reports feeling unwell but denies any chest pain or shortness of breath.  There has been no new medication changes.  Granddaughter last saw her on Monday and she was doing well and they went out for lunch.  The history is provided by the EMS personnel, the nursing home, medical records and a relative.  Altered Mental Status Atrial Fibrillation       Prior to Admission medications   Medication Sig Start Date End Date Taking? Authorizing Provider  acetaminophen  (TYLENOL ) 325 MG tablet Take 2 tablets (650 mg total) by mouth every 4 (four) hours as needed for  headache, mild pain (pain score 1-3) or moderate pain (pain score 4-6). 09/23/23   Hongalgi, Anand D, MD  albuterol  (VENTOLIN  HFA) 108 (90 Base) MCG/ACT inhaler Inhale 1-2 puffs into the lungs every 6 (six) hours as needed for wheezing or shortness of breath. 10/04/22   Singh, Prashant K, MD  aspirin  EC 81 MG tablet Take 81 mg by mouth daily. Swallow whole.    [provider]  atorvastatin  (LIPITOR ) 80 MG tablet Take 1 tablet (80 mg total) by mouth at bedtime. 06/04/23   Regalado, Belkys A, MD  busPIRone  (BUSPAR ) 7.5 MG tablet Take 7.5 mg by mouth 2 (two) times daily.    [provider]  Dulaglutide 3 MG/0.5ML SOPN Inject 3 mg into the skin every Saturday.    [provider]  estradiol (ESTRACE) 0.1 MG/GM vaginal cream Place 1 Applicatorful vaginally as needed (dryness / irritation).    [provider]  ferrous sulfate  325 (65 FE) MG tablet Take 325 mg by mouth daily with breakfast.    [provider]  FLUoxetine  (PROZAC ) 40 MG capsule Take 40 mg by mouth daily.    [provider]  fluticasone (FLONASE) 50 MCG/ACT nasal spray Place 1 spray into both nostrils daily as needed for allergies or rhinitis.    [provider]  gabapentin  (NEURONTIN ) 400 MG capsule Take 400 mg by mouth 3 (three) times daily.  [provider]  guaiFENesin -dextromethorphan  (ROBITUSSIN DM) 100-10 MG/5ML syrup Take 10 mLs by mouth every 4 (four) hours as needed for cough. 09/23/23   Hongalgi, Anand D, MD  hydrOXYzine  (ATARAX ) 10 MG tablet Take 1 tablet (10 mg total) by mouth every 8 (eight) hours as needed for anxiety. 09/23/23   Hongalgi, Anand D, MD  insulin  aspart (NOVOLOG ) 100 UNIT/ML FlexPen Inject 0-9 Units into the skin 3 (three) times daily with meals. CBG < 70: Implement Hypoglycemia protocol, CBG 70 - 120: 0 units CBG 121 - 150: 1 unit CBG 151 - 200: 2 units CBG 201 - 250: 3 units CBG 251 - 300: 5 units CBG 301 - 350: 7 units CBG 351 - 400: 9 units CBG >  400: call MD. 09/23/23   Hongalgi, Anand D, MD  insulin  glargine-yfgn (SEMGLEE ) 100 UNIT/ML injection Inject 0.07 mLs (7 Units total) into the skin daily. 09/24/23   Hongalgi, Anand D, MD  ipratropium-albuterol  (DUONEB) 0.5-2.5 (3) MG/3ML SOLN Use 3 ml by nebulization twice a day scheduled and every 4 hours as needed for shortness of breath and wheezing Patient taking differently: Take 3 mLs by nebulization daily as needed (SOB and wheezing). Use 3 ml by nebulization twice a day scheduled and every 4 hours as needed for shortness of breath and wheezing 10/04/22   Singh, Prashant K, MD  loratadine  (CLARITIN ) 10 MG tablet Take 10 mg by mouth daily as needed for allergies.    [provider]  meclizine (ANTIVERT) 25 MG tablet Take 25 mg by mouth daily as needed for dizziness. 10/01/19   [provider]  metFORMIN  (GLUCOPHAGE ) 1000 MG tablet Take 1,000 mg by mouth 2 (two) times daily with a meal.    [provider]  metoprolol  succinate (TOPROL -XL) 25 MG 24 hr tablet Take 1 tablet (25 mg total) by mouth daily. 09/24/23   Hongalgi, Anand D, MD  nystatin  (MYCOSTATIN ) 100000 UNIT/ML suspension Take 1 mL by mouth 4 (four) times daily as needed (red gums).    [provider]  ondansetron  (ZOFRAN -ODT) 4 MG disintegrating tablet Take 1 tablet (4 mg total) by mouth every 8 (eight) hours as needed for nausea or vomiting. 08/08/23   Ula Prentice SAUNDERS, MD  pantoprazole  (PROTONIX ) 40 MG tablet Take 40 mg by mouth 2 (two) times daily.    [provider]  rOPINIRole  (REQUIP ) 0.25 MG tablet Take 0.25 mg by mouth at bedtime.    [provider]  sennosides-docusate sodium  (SENOKOT-S) 8.6-50 MG tablet Take 1 tablet by mouth 2 (two) times daily as needed for constipation.    [provider]  tamsulosin  (FLOMAX ) 0.4 MG CAPS capsule Take 1 capsule (0.4 mg total) by mouth daily. 10/04/22   Singh, Prashant K, MD  topiramate  (TOPAMAX ) 50 MG tablet Take 1 tablet (50 mg total) by  mouth 2 (two) times daily. 06/05/23   Rai, Ripudeep MARLA, MD    Allergies: Lasix [furosemide], Bacitracin, Ciprofloxacin , Citalopram, Doxycycline , Latex, Morphine and codeine , Duloxetine, Levaquin [levofloxacin in d5w], Salvia officinalis, and Soma [carisoprodol]    Review of Systems  Updated Vital Signs BP (!) 126/104   Pulse (!) 117   Temp (!) 96.8 F (36 C) (Axillary)   Resp 14   SpO2 99%   Physical Exam Vitals and nursing note reviewed.  Constitutional:      General: She is in acute distress.     Appearance: She is well-developed. She is ill-appearing.  HENT:     Head: Normocephalic and atraumatic.  Eyes:     Pupils: Pupils are equal, round, and reactive to light.     Comments: Pale conjunctive a  Cardiovascular:     Rate and Rhythm: Tachycardia present. Rhythm irregularly irregular.     Heart sounds: Normal heart sounds. No murmur heard.    No friction rub.     Comments: Thready pulse in the right foot and foot is cold Pulmonary:     Effort: Pulmonary effort is normal.     Breath sounds: Normal breath sounds. No wheezing or rales.  Abdominal:     General: Bowel sounds are normal. There is distension.     Palpations: Abdomen is soft.     Tenderness: There is no abdominal tenderness. There is no guarding or rebound.  Musculoskeletal:        General: No tenderness. Normal range of motion.     Right lower leg: Edema present.     Comments: Mild pitting edema in the right lower extremity.  Left BKA  Skin:    General: Skin is warm and dry.     Coloration: Skin is pale.     Findings: No rash.  Neurological:     Mental Status: She is alert.     Cranial Nerves: No cranial nerve deficit.     Comments: Will wake up and talk and then goes back to sleep.  Sluggish  Psychiatric:     Comments: Calm and cooperative     (all labs ordered are listed, but only abnormal results are displayed) Labs Reviewed  COMPREHENSIVE METABOLIC PANEL WITH GFR - Abnormal; Notable for the  following components:      Result Value   Potassium 5.4 (*)    CO2 13 (*)    Glucose, Bld 149 (*)    Creatinine, Ser 1.72 (*)    Calcium  8.5 (*)    Total Protein 5.5 (*)    Albumin 2.6 (*)    Total Bilirubin 1.3 (*)    GFR, Estimated 32 (*)    Anion gap 18 (*)    All other components within normal limits  CBC - Abnormal; Notable for the following components:   WBC 10.6 (*)    RBC 3.74 (*)    Hemoglobin 10.3 (*)    HCT 35.2 (*)    MCHC 29.3 (*)    RDW 16.4 (*)    Platelets 407 (*)    All other components within normal limits  BRAIN NATRIURETIC PEPTIDE - Abnormal; Notable for the following components:   B Natriuretic Peptide 1,791.4 (*)    All other components within normal limits  LACTIC ACID, PLASMA - Abnormal; Notable for the following components:   Lactic Acid, Venous 3.1 (*)    All other components within normal limits  LACTIC ACID, PLASMA - Abnormal; Notable for the following components:   Lactic Acid, Venous 2.7 (*)    All other components within normal limits  CBG MONITORING, ED - Abnormal; Notable for the following components:   Glucose-Capillary 137 (*)    All other components within normal limits  TROPONIN I (HIGH SENSITIVITY) - Abnormal; Notable for the following components:   Troponin I (High Sensitivity) 131 (*)    All other components within normal limits  URINALYSIS, ROUTINE W REFLEX MICROSCOPIC  I-STAT VENOUS BLOOD GAS, ED  TROPONIN I (HIGH SENSITIVITY)    EKG: EKG Interpretation Date/Time:  Wednesday October 23 2023 11:33:32 EDT Ventricular Rate:  136 PR Interval:    QRS Duration:  106 QT Interval:  317  QTC Calculation: 477 R Axis:   93  Text Interpretation: new Atrial fibrillation Right axis deviation Low voltage, precordial leads Nonspecific T abnormalities, diffuse leads Confirmed by Doretha Folks ([id number]) on 10/23/2023 11:59:40 AM  Radiology: [facility] Chest Port 1 View Result Date: 10/23/2023 CLINICAL DATA:  Tachycardia. EXAM: PORTABLE CHEST 1 VIEW  COMPARISON:  Chest radiograph dated 09/09/2023. FINDINGS: There is mild cardiomegaly with mild vascular congestion. Small bilateral pleural effusions and bibasilar atelectasis or infiltrate. No pneumothorax. No acute osseous pathology. IMPRESSION: Mild cardiomegaly with mild CHF and small bilateral pleural effusions. Electronically Signed   By: Vanetta Chou M.D.   On: 10/23/2023 13:53     Procedures   Medications Ordered in the ED  sodium chloride  0.9 % bolus 500 mL (0 mLs Intravenous Stopped 10/23/23 1310)  sodium chloride  0.9 % bolus 500 mL (0 mLs Intravenous Stopped 10/23/23 1443)                                    Medical Decision Making Amount and/or Complexity of Data Reviewed External Data Reviewed: notes. Labs: ordered. Decision-making details documented in ED Course. Radiology: ordered and independent interpretation performed. Decision-making details documented in ED Course. ECG/medicine tests: ordered and independent interpretation performed. Decision-making details documented in ED Course.  Risk Prescription drug management.   Pt with multiple medical problems and comorbidities and presenting today with a complaint that caries a high risk for morbidity and mortality.  Here today with the above complaints.  Concern for hypoperfusion either from new onset A-fib RVR or as a consequence of possibly new symptomatic anemia versus infectious etiology.  Also concern for ACS, CHF.  Patient has known cardiac disease that is not amenable to surgery or stent placement.  No recent medication changes.  I independently interpreted patient's EKG.  EKG does show a narrow complex atrial fibrillation with RVR.  Initial blood pressure was low with EMS but did improve after a 500 mL bolus.  Blood pressures are currently in the low 100s with heart rate between 112-140.  Will start a Cardizem  drip.  Had a long discussion with patient's granddaughter who reports she would not want compressions or  intubation.  She would not want her heart to be shocked.  She feels that she would be okay getting IV fluids or blood products if needed.  Also antibiotics would be okay.  3:47 PM I independently interpreted patient's labs and CBC with white count of 10, stable hemoglobin of 10, CMP with a new AKI with creatinine of 1.7 from 1.2, anion gap of 18, lactic acid of 3 felt to be most likely from poor perfusion opposed to infection as patient has been afebrile here.  Initially patient was given fluid with persistent atrial fibrillation here, BNP is worse now at 1791.  I have independently visualized and interpreted pt's images today.  Chest x-ray with signs of fluid overload.  Discussing with the family extensively and the patient is much as she can understand as she is mildly confused they are deciding between comfort measures versus more aggressive care.  Concern for cardiogenic shock.  Will discuss with cardiology to see if we can get a bedside echo to help dictate care. Spoke with cardiology Dr. [place] who felt based on her lab work there is a component of cardiogenic shock and given patient is not a candidate for CABG does not wish to be shocked or have chest compressions or  intubated he does not feel that she is a candidate for aggressive measures.  This was discussed with the patient and her family and they all agree she would prefer to be comfort care.  Consulted palliative care and hospitalist for admission.  CRITICAL CARE Performed by: Norwood Quezada Total critical care time: 60 minutes Critical care time was exclusive of separately billable procedures and treating other patients. Critical care was necessary to treat or prevent imminent or life-threatening deterioration. Critical care was time spent personally by me on the following activities: development of treatment plan with patient and/or surrogate as well as nursing, discussions with consultants, evaluation of patient's response to treatment,  examination of patient, obtaining history from patient or surrogate, ordering and performing treatments and interventions, ordering and review of laboratory studies, ordering and review of radiographic studies, pulse oximetry and re-evaluation of patient's condition.        Final diagnoses:  Atrial fibrillation with rapid ventricular response (HCC)  Hypotension, unspecified hypotension type  AKI (acute kidney injury) [facility])    ED Discharge Orders          Ordered    Consult to palliative care       Provider:  (Not yet assigned)   10/23/23 1546               Doretha Folks, MD 10/23/23 1548

## 2023-10-23 NOTE — ED Triage Notes (Addendum)
 PT arrives via EMS from [facility]. Staff found patient in her room unresponsive/asleep. Upon EMS arrival, pt was awake and alert. EMS reports initial bp of 86/56. EMS administered 500mL on NS with no improvement. EMS reports possible new afib/rvr with a rate in the 130s. Pt currently has a gcs of 14, unsure if this is her baseline. Pt arrives on 4LNC   BG 151 with ems

## 2023-10-23 NOTE — ED Notes (Signed)
 Diltiazem  held due to patient blood pressure being 70/50 consistently. MD notified. Awaiting new orders.

## 2023-10-23 NOTE — Progress Notes (Signed)
    Patient Name: Anna [name]           DOB: 07-Feb-1956  MRN: [id number]       OVERNIGHT EVENT    Notified by RN that patient has expired at 2340 .  2 RN verified. Patient was comfort care.   Family is at bedside.     Heba Ige, DNP, ACNPC- AG [facility] [place]

## 2023-10-23 NOTE — ED Notes (Signed)
 [place]unk[place], MD notified of patient lactic acid 3.1 and low blood pressure despite fluid bolus. Order for another 500cc bolus to be given.

## 2023-10-25 DEATH — deceased

## 2023-11-25 NOTE — Discharge Summary (Signed)
 Death Summary  Anna [name] FMW:[id number] DOB: 1956/02/10 DOA: Nov 14, 2023  PCP: Cloria Annabella CROME, DO  Admit date: 11/14/23 Date of Death: 11/16/23  Final Diagnoses:  Principal Problem:   Acute on chronic systolic (congestive) heart failure (HCC) Active Problems:   History of stroke   COPD mixed type (HCC)   Type 2 diabetes mellitus (HCC)   OSA (obstructive sleep apnea)   GERD (gastroesophageal reflux disease)   Mood disorder (HCC)   Anxiety   Hyperlipidemia   PAD (peripheral artery disease) (HCC)   Spinal stenosis of lumbar region with neurogenic claudication   CKD stage 3a, GFR 45-59 ml/min (HCC)   HFrEF (heart failure with reduced ejection fraction) (HCC)   Status post below-knee amputation of left lower extremity (HCC)   Cardiogenic shock (HCC)   Paroxysmal atrial fibrillation with RVR (HCC)   Acute renal failure superimposed on stage 3a chronic kidney disease [facility])  Hospital Course:   Atrial fibrillation with RVR, new onset AKI on CKD 3A Acute on chronic combined systolic and diastolic CHF Cardiogenic shock Acute respiratory failure. > Presenting with confusion, syncope, hypotension, A-fib with RVR.  Found to have AKI. > BNP 1791, lactic acid 3.1, 2.7, troponin 131.  Blood pressure persistently in the 70-80 systolic.  Now with oxygen requirement. > This is consistent with cardiogenic shock in the setting of A-fib with RVR and acute on chronic combined heart failure.  Known advanced CAD as per HPI with patient not being a candidate for CT surgery and declining further measures.  > After discussion with family and ED provider as well as cardiologist and given patient's known wishes for DNR/DNI/no cardioversion/avoid hospitalization/comfort measures; it was decided to make patient full comfort care.  Palliative medicine has been consulted.  - Admit to the palliative floor - Appreciate palliative medicine recommendations and assistance - Comfort care orders including as  needed medication for pain, anxiety were placed   Patient expired overnight and this was confirmed by 2 RNs per chart.   Time of death: 11-18-2338  Signed:  Marsa KATHEE Scurry  [facility] 11-16-23, 12:26 PM

## 2023-12-18 ENCOUNTER — Other Ambulatory Visit (HOSPITAL_COMMUNITY): Payer: Self-pay

## 2023-12-23 ENCOUNTER — Inpatient Hospital Stay: Payer: Medicare (Managed Care) | Admitting: Neurology
